# Patient Record
Sex: Female | Born: 1955
Health system: Southern US, Community
[De-identification: ages and names within clinical notes are randomized; demographics above are authoritative.]

## PROBLEM LIST (undated history)

## (undated) DIAGNOSIS — R42 Dizziness and giddiness: Secondary | ICD-10-CM

## (undated) DIAGNOSIS — J302 Other seasonal allergic rhinitis: Secondary | ICD-10-CM

## (undated) DIAGNOSIS — E119 Type 2 diabetes mellitus without complications: Secondary | ICD-10-CM

## (undated) DIAGNOSIS — B20 Human immunodeficiency virus [HIV] disease: Secondary | ICD-10-CM

## (undated) DIAGNOSIS — I639 Cerebral infarction, unspecified: Secondary | ICD-10-CM

## (undated) DIAGNOSIS — Z972 Presence of dental prosthetic device (complete) (partial): Secondary | ICD-10-CM

## (undated) DIAGNOSIS — R51 Headache: Secondary | ICD-10-CM

## (undated) DIAGNOSIS — I10 Essential (primary) hypertension: Secondary | ICD-10-CM

## (undated) DIAGNOSIS — Z973 Presence of spectacles and contact lenses: Secondary | ICD-10-CM

## (undated) DIAGNOSIS — J45909 Unspecified asthma, uncomplicated: Secondary | ICD-10-CM

## (undated) DIAGNOSIS — M1990 Unspecified osteoarthritis, unspecified site: Secondary | ICD-10-CM

## (undated) DIAGNOSIS — E785 Hyperlipidemia, unspecified: Secondary | ICD-10-CM

## (undated) DIAGNOSIS — Z21 Asymptomatic human immunodeficiency virus [HIV] infection status: Secondary | ICD-10-CM

## (undated) DIAGNOSIS — K5190 Ulcerative colitis, unspecified, without complications: Secondary | ICD-10-CM

## (undated) DIAGNOSIS — R519 Headache, unspecified: Secondary | ICD-10-CM

## (undated) DIAGNOSIS — N289 Disorder of kidney and ureter, unspecified: Secondary | ICD-10-CM

## (undated) HISTORY — PX: COLONOSCOPY W/ POLYPECTOMY: SHX1380

## (undated) HISTORY — PX: MULTIPLE TOOTH EXTRACTIONS: SHX2053

## (undated) HISTORY — PX: BUNIONECTOMY: SHX129

## (undated) HISTORY — DX: Cerebral infarction, unspecified: I63.9

## (undated) HISTORY — DX: Hyperlipidemia, unspecified: E78.5

## (undated) HISTORY — PX: TUBAL LIGATION: SHX77

## (undated) HISTORY — PX: TONSILLECTOMY: SUR1361

---

## 1998-04-30 ENCOUNTER — Emergency Department (HOSPITAL_COMMUNITY): Admission: EM | Admit: 1998-04-30 | Discharge: 1998-04-30 | Payer: Self-pay | Admitting: Emergency Medicine

## 2001-06-04 ENCOUNTER — Emergency Department (HOSPITAL_COMMUNITY): Admission: EM | Admit: 2001-06-04 | Discharge: 2001-06-04 | Payer: Self-pay | Admitting: Emergency Medicine

## 2002-12-07 ENCOUNTER — Emergency Department (HOSPITAL_COMMUNITY): Admission: EM | Admit: 2002-12-07 | Discharge: 2002-12-07 | Payer: Self-pay | Admitting: Emergency Medicine

## 2003-08-19 ENCOUNTER — Emergency Department (HOSPITAL_COMMUNITY): Admission: EM | Admit: 2003-08-19 | Discharge: 2003-08-19 | Payer: Self-pay | Admitting: Emergency Medicine

## 2005-01-06 ENCOUNTER — Ambulatory Visit (HOSPITAL_COMMUNITY): Admission: RE | Admit: 2005-01-06 | Discharge: 2005-01-06 | Payer: Self-pay | Admitting: Gynecology

## 2005-01-16 ENCOUNTER — Ambulatory Visit (HOSPITAL_COMMUNITY): Admission: RE | Admit: 2005-01-16 | Discharge: 2005-01-16 | Payer: Self-pay | Admitting: Gynecology

## 2005-05-04 ENCOUNTER — Inpatient Hospital Stay (HOSPITAL_COMMUNITY): Admission: AD | Admit: 2005-05-04 | Discharge: 2005-05-04 | Payer: Self-pay | Admitting: Gynecology

## 2005-05-06 ENCOUNTER — Ambulatory Visit (HOSPITAL_COMMUNITY): Admission: RE | Admit: 2005-05-06 | Discharge: 2005-05-06 | Payer: Self-pay | Admitting: Gynecology

## 2005-05-11 ENCOUNTER — Inpatient Hospital Stay (HOSPITAL_COMMUNITY): Admission: AD | Admit: 2005-05-11 | Discharge: 2005-05-16 | Payer: Self-pay | Admitting: Gynecology

## 2005-05-11 ENCOUNTER — Ambulatory Visit: Payer: Self-pay | Admitting: Pulmonary Disease

## 2005-05-13 ENCOUNTER — Encounter (INDEPENDENT_AMBULATORY_CARE_PROVIDER_SITE_OTHER): Payer: Self-pay | Admitting: Specialist

## 2006-09-16 ENCOUNTER — Emergency Department (HOSPITAL_COMMUNITY): Admission: EM | Admit: 2006-09-16 | Discharge: 2006-09-16 | Payer: Self-pay | Admitting: Emergency Medicine

## 2008-02-27 ENCOUNTER — Emergency Department (HOSPITAL_COMMUNITY): Admission: EM | Admit: 2008-02-27 | Discharge: 2008-02-27 | Payer: Self-pay | Admitting: Emergency Medicine

## 2008-02-28 ENCOUNTER — Ambulatory Visit: Payer: Self-pay | Admitting: *Deleted

## 2008-03-09 ENCOUNTER — Ambulatory Visit: Payer: Self-pay | Admitting: Nurse Practitioner

## 2008-03-09 DIAGNOSIS — J45909 Unspecified asthma, uncomplicated: Secondary | ICD-10-CM | POA: Insufficient documentation

## 2008-03-09 DIAGNOSIS — K029 Dental caries, unspecified: Secondary | ICD-10-CM | POA: Insufficient documentation

## 2008-03-22 ENCOUNTER — Encounter (INDEPENDENT_AMBULATORY_CARE_PROVIDER_SITE_OTHER): Payer: Self-pay | Admitting: Nurse Practitioner

## 2008-03-27 ENCOUNTER — Telehealth (INDEPENDENT_AMBULATORY_CARE_PROVIDER_SITE_OTHER): Payer: Self-pay | Admitting: Nurse Practitioner

## 2008-04-04 ENCOUNTER — Ambulatory Visit: Payer: Self-pay | Admitting: Nurse Practitioner

## 2008-04-04 DIAGNOSIS — F339 Major depressive disorder, recurrent, unspecified: Secondary | ICD-10-CM | POA: Insufficient documentation

## 2008-04-04 DIAGNOSIS — N951 Menopausal and female climacteric states: Secondary | ICD-10-CM | POA: Insufficient documentation

## 2008-04-04 LAB — CONVERTED CEMR LAB
ALT: 32 units/L (ref 0–35)
AST: 26 units/L (ref 0–37)
Albumin: 4.3 g/dL (ref 3.5–5.2)
Alkaline Phosphatase: 162 units/L — ABNORMAL HIGH (ref 39–117)
BUN: 13 mg/dL (ref 6–23)
Basophils Absolute: 0 10*3/uL (ref 0.0–0.1)
Basophils Relative: 0 % (ref 0–1)
Bilirubin Urine: NEGATIVE
CO2: 29 meq/L (ref 19–32)
Calcium: 9.5 mg/dL (ref 8.4–10.5)
Chlamydia, DNA Probe: NEGATIVE
Chloride: 101 meq/L (ref 96–112)
Cholesterol: 209 mg/dL — ABNORMAL HIGH (ref 0–200)
Creatinine, Ser: 0.81 mg/dL (ref 0.40–1.20)
Eosinophils Absolute: 0.1 10*3/uL (ref 0.0–0.7)
Eosinophils Relative: 2 % (ref 0–5)
GC Probe Amp, Genital: NEGATIVE
Glucose, Bld: 93 mg/dL (ref 70–99)
Glucose, Urine, Semiquant: NEGATIVE
HCT: 38.8 % (ref 36.0–46.0)
HDL: 56 mg/dL (ref >39)
Hemoglobin: 12.1 g/dL (ref 12.0–15.0)
KOH Prep: NEGATIVE
Ketones, urine, test strip: NEGATIVE
LDL Cholesterol: 117 mg/dL — ABNORMAL HIGH (ref 0–99)
Lymphocytes Relative: 40 % (ref 12–46)
Lymphs Abs: 2 10*3/uL (ref 0.7–4.0)
MCHC: 31.2 g/dL (ref 30.0–36.0)
MCV: 89.6 fL (ref 78.0–100.0)
Monocytes Absolute: 0.5 10*3/uL (ref 0.1–1.0)
Monocytes Relative: 9 % (ref 3–12)
Neutro Abs: 2.4 10*3/uL (ref 1.7–7.7)
Neutrophils Relative %: 49 % (ref 43–77)
Nitrite: NEGATIVE
Pap Smear: NEGATIVE
Platelets: 280 10*3/uL (ref 150–400)
Potassium: 4.2 meq/L (ref 3.5–5.3)
Protein, U semiquant: NEGATIVE
RBC: 4.33 M/uL (ref 3.87–5.11)
RDW: 14 % (ref 11.5–15.5)
Sodium: 141 meq/L (ref 135–145)
Specific Gravity, Urine: 1.025
TSH: 1.893 microintl units/mL (ref 0.350–4.50)
Total Bilirubin: 0.4 mg/dL (ref 0.3–1.2)
Total CHOL/HDL Ratio: 3.7
Total Protein: 7.3 g/dL (ref 6.0–8.3)
Triglycerides: 182 mg/dL — ABNORMAL HIGH (ref <150)
Urobilinogen, UA: 0.2
VLDL: 36 mg/dL (ref 0–40)
WBC Urine, dipstick: NEGATIVE
WBC: 5 10*3/uL (ref 4.0–10.5)
pH: 5.5

## 2008-04-05 ENCOUNTER — Encounter (INDEPENDENT_AMBULATORY_CARE_PROVIDER_SITE_OTHER): Payer: Self-pay | Admitting: Nurse Practitioner

## 2008-04-06 ENCOUNTER — Encounter (INDEPENDENT_AMBULATORY_CARE_PROVIDER_SITE_OTHER): Payer: Self-pay | Admitting: Nurse Practitioner

## 2008-04-10 DIAGNOSIS — N3000 Acute cystitis without hematuria: Secondary | ICD-10-CM | POA: Insufficient documentation

## 2008-06-19 ENCOUNTER — Telehealth (INDEPENDENT_AMBULATORY_CARE_PROVIDER_SITE_OTHER): Payer: Self-pay | Admitting: Nurse Practitioner

## 2008-06-19 ENCOUNTER — Inpatient Hospital Stay (HOSPITAL_COMMUNITY): Admission: EM | Admit: 2008-06-19 | Discharge: 2008-06-21 | Payer: Self-pay | Admitting: Family Medicine

## 2008-06-26 ENCOUNTER — Ambulatory Visit: Payer: Self-pay | Admitting: Nurse Practitioner

## 2008-06-26 DIAGNOSIS — G4700 Insomnia, unspecified: Secondary | ICD-10-CM | POA: Insufficient documentation

## 2008-06-26 DIAGNOSIS — K8010 Calculus of gallbladder with chronic cholecystitis without obstruction: Secondary | ICD-10-CM | POA: Insufficient documentation

## 2008-06-26 DIAGNOSIS — K5190 Ulcerative colitis, unspecified, without complications: Secondary | ICD-10-CM | POA: Insufficient documentation

## 2008-06-27 ENCOUNTER — Telehealth (INDEPENDENT_AMBULATORY_CARE_PROVIDER_SITE_OTHER): Payer: Self-pay | Admitting: Nurse Practitioner

## 2008-07-04 ENCOUNTER — Encounter (INDEPENDENT_AMBULATORY_CARE_PROVIDER_SITE_OTHER): Payer: Self-pay | Admitting: *Deleted

## 2008-09-04 ENCOUNTER — Telehealth: Payer: Self-pay | Admitting: Internal Medicine

## 2009-03-11 ENCOUNTER — Ambulatory Visit: Payer: Self-pay | Admitting: Cardiology

## 2009-03-12 ENCOUNTER — Observation Stay (HOSPITAL_COMMUNITY): Admission: EM | Admit: 2009-03-12 | Discharge: 2009-03-12 | Payer: Self-pay | Admitting: Emergency Medicine

## 2009-03-14 ENCOUNTER — Telehealth (INDEPENDENT_AMBULATORY_CARE_PROVIDER_SITE_OTHER): Payer: Self-pay | Admitting: *Deleted

## 2009-03-15 ENCOUNTER — Telehealth (INDEPENDENT_AMBULATORY_CARE_PROVIDER_SITE_OTHER): Payer: Self-pay | Admitting: *Deleted

## 2009-03-16 ENCOUNTER — Emergency Department (HOSPITAL_COMMUNITY): Admission: EM | Admit: 2009-03-16 | Discharge: 2009-03-17 | Payer: Self-pay | Admitting: Emergency Medicine

## 2009-03-20 ENCOUNTER — Telehealth (INDEPENDENT_AMBULATORY_CARE_PROVIDER_SITE_OTHER): Payer: Self-pay

## 2009-05-20 ENCOUNTER — Emergency Department (HOSPITAL_COMMUNITY): Admission: EM | Admit: 2009-05-20 | Discharge: 2009-05-21 | Payer: Self-pay | Admitting: Emergency Medicine

## 2009-08-01 ENCOUNTER — Emergency Department (HOSPITAL_COMMUNITY): Admission: EM | Admit: 2009-08-01 | Discharge: 2009-08-01 | Payer: Self-pay | Admitting: Family Medicine

## 2010-08-03 ENCOUNTER — Encounter: Payer: Self-pay | Admitting: Gynecology

## 2010-08-04 ENCOUNTER — Encounter: Payer: Self-pay | Admitting: Family Medicine

## 2010-10-18 LAB — POCT CARDIAC MARKERS
CKMB, poc: <1 ng/mL — ABNORMAL LOW (ref 1.0–8.0)
CKMB, poc: <1 ng/mL — ABNORMAL LOW (ref 1.0–8.0)
Myoglobin, poc: 46.8 ng/mL (ref 12–200)
Myoglobin, poc: 48.2 ng/mL (ref 12–200)
Troponin i, poc: <0.05 ng/mL (ref 0.00–0.09)
Troponin i, poc: <0.05 ng/mL (ref 0.00–0.09)

## 2010-10-18 LAB — DIFFERENTIAL
Basophils Absolute: 0 10*3/uL (ref 0.0–0.1)
Basophils Relative: 0 % (ref 0–1)
Eosinophils Absolute: 0.1 10*3/uL (ref 0.0–0.7)
Eosinophils Relative: 2 % (ref 0–5)
Lymphocytes Relative: 43 % (ref 12–46)
Lymphs Abs: 2.7 10*3/uL (ref 0.7–4.0)
Monocytes Absolute: 0.6 10*3/uL (ref 0.1–1.0)
Monocytes Relative: 9 % (ref 3–12)
Neutro Abs: 2.8 10*3/uL (ref 1.7–7.7)
Neutrophils Relative %: 45 % (ref 43–77)

## 2010-10-18 LAB — CK TOTAL AND CKMB (NOT AT ARMC)
CK, MB: 1 ng/mL (ref 0.3–4.0)
Relative Index: 0.8 (ref 0.0–2.5)
Total CK: 119 U/L (ref 7–177)

## 2010-10-18 LAB — POCT I-STAT, CHEM 8
BUN: 15 mg/dL (ref 6–23)
Calcium, Ion: 1.14 mmol/L (ref 1.12–1.32)
Chloride: 106 mEq/L (ref 96–112)
Creatinine, Ser: 0.8 mg/dL (ref 0.4–1.2)
Glucose, Bld: 98 mg/dL (ref 70–99)
HCT: 36 % (ref 36.0–46.0)
Hemoglobin: 12.2 g/dL (ref 12.0–15.0)
Potassium: 3.5 mEq/L (ref 3.5–5.1)
Sodium: 141 mEq/L (ref 135–145)
TCO2: 26 mmol/L (ref 0–100)

## 2010-10-18 LAB — CBC
HCT: 34.6 % — ABNORMAL LOW (ref 36.0–46.0)
Hemoglobin: 11.6 g/dL — ABNORMAL LOW (ref 12.0–15.0)
MCHC: 33.7 g/dL (ref 30.0–36.0)
MCV: 86.2 fL (ref 78.0–100.0)
Platelets: 219 10*3/uL (ref 150–400)
RBC: 4.01 MIL/uL (ref 3.87–5.11)
RDW: 13.4 % (ref 11.5–15.5)
WBC: 6.3 10*3/uL (ref 4.0–10.5)

## 2010-10-18 LAB — TROPONIN I: Troponin I: 0.02 ng/mL (ref 0.00–0.06)

## 2010-10-19 LAB — CARDIAC PANEL(CRET KIN+CKTOT+MB+TROPI)
CK, MB: 1 ng/mL (ref 0.3–4.0)
Relative Index: 0.8 (ref 0.0–2.5)
Total CK: 123 U/L (ref 7–177)
Troponin I: 0.02 ng/mL (ref 0.00–0.06)

## 2010-10-19 LAB — CBC
HCT: 33.7 % — ABNORMAL LOW (ref 36.0–46.0)
HCT: 35.3 % — ABNORMAL LOW (ref 36.0–46.0)
Hemoglobin: 11.3 g/dL — ABNORMAL LOW (ref 12.0–15.0)
Hemoglobin: 11.8 g/dL — ABNORMAL LOW (ref 12.0–15.0)
MCHC: 33.3 g/dL (ref 30.0–36.0)
MCHC: 33.6 g/dL (ref 30.0–36.0)
MCV: 86.6 fL (ref 78.0–100.0)
Platelets: 204 10*3/uL (ref 150–400)
Platelets: 231 10*3/uL (ref 150–400)
RBC: 4.08 MIL/uL (ref 3.87–5.11)
RDW: 13.5 % (ref 11.5–15.5)
RDW: 13.8 % (ref 11.5–15.5)
WBC: 6.9 10*3/uL (ref 4.0–10.5)

## 2010-10-19 LAB — BASIC METABOLIC PANEL
BUN: 10 mg/dL (ref 6–23)
CO2: 30 mEq/L (ref 19–32)
Calcium: 9.4 mg/dL (ref 8.4–10.5)
Chloride: 104 mEq/L (ref 96–112)
Creatinine, Ser: 0.91 mg/dL (ref 0.4–1.2)
GFR calc Af Amer: >60 mL/min (ref >60)
GFR calc non Af Amer: >60 mL/min (ref >60)
Glucose, Bld: 135 mg/dL — ABNORMAL HIGH (ref 70–99)
Potassium: 3.2 mEq/L — ABNORMAL LOW (ref 3.5–5.1)
Sodium: 141 mEq/L (ref 135–145)

## 2010-10-19 LAB — HEMOGLOBIN A1C
Hgb A1c MFr Bld: 6.5 % — ABNORMAL HIGH (ref 4.6–6.1)
Mean Plasma Glucose: 140 mg/dL

## 2010-10-19 LAB — COMPREHENSIVE METABOLIC PANEL
Albumin: 3.5 g/dL (ref 3.5–5.2)
BUN: 11 mg/dL (ref 6–23)
Calcium: 8.8 mg/dL (ref 8.4–10.5)
Creatinine, Ser: 0.88 mg/dL (ref 0.4–1.2)
Glucose, Bld: 110 mg/dL — ABNORMAL HIGH (ref 70–99)
Total Protein: 6.3 g/dL (ref 6.0–8.3)

## 2010-10-19 LAB — POCT CARDIAC MARKERS
Myoglobin, poc: 75.9 ng/mL (ref 12–200)
Troponin i, poc: <0.05 ng/mL (ref 0.00–0.09)

## 2010-10-19 LAB — LIPID PANEL
Cholesterol: 196 mg/dL (ref 0–200)
LDL Cholesterol: 111 mg/dL — ABNORMAL HIGH (ref 0–99)
Triglycerides: 207 mg/dL — ABNORMAL HIGH (ref <150)
VLDL: 41 mg/dL — ABNORMAL HIGH (ref 0–40)

## 2010-10-19 LAB — DIFFERENTIAL
Basophils Absolute: 0 10*3/uL (ref 0.0–0.1)
Basophils Relative: 0 % (ref 0–1)
Eosinophils Absolute: 0.1 10*3/uL (ref 0.0–0.7)
Eosinophils Relative: 1 % (ref 0–5)
Lymphocytes Relative: 46 % (ref 12–46)
Lymphs Abs: 3.2 10*3/uL (ref 0.7–4.0)
Monocytes Absolute: 0.5 10*3/uL (ref 0.1–1.0)
Monocytes Relative: 7 % (ref 3–12)
Neutro Abs: 3.1 10*3/uL (ref 1.7–7.7)
Neutrophils Relative %: 45 % (ref 43–77)

## 2010-10-19 LAB — CK TOTAL AND CKMB (NOT AT ARMC): Total CK: 155 U/L (ref 7–177)

## 2010-11-26 NOTE — H&P (Signed)
[name], [name]           ACCOUNT NO.:  [id number]   MEDICAL RECORD NO.:  [id number]          PATIENT TYPE:  INP   LOCATION:  6713                         FACILITY:  [facility]   PHYSICIAN:  Darryl D. Prime, MD    DATE OF BIRTH:  12-Nov-1955   DATE OF ADMISSION:  06/19/2008  DATE OF DISCHARGE:                              HISTORY & PHYSICAL   The patient is a full code.   Patient has no primary care physician.   CHIEF COMPLAINT:  Bloody diarrhea for weeks.   HISTORY OF PRESENT ILLNESS:  [name] is a 55 year old female with  a history of ulcerative colitis, but has had no flare since she was 55  years old, who notes that for the last 3 weeks bloody diarrhea every 28  minutes or so.  The patient notes associated left lower quadrant crampy,  constant pain as well, mild-to-moderate.  The patient notes nausea and  vomiting started a week ago, nonbloody, nonbilious occasionally.  She  has taken nothing for it.  She denies any fever.  She notes profound  weakness with it, however.  Symptoms persisted, so she decided to come  to the emergency room.  In the emergency room, she was given Zofran and  Dilaudid at urgent care.  Nothing was given in the [facility] ED.  The  patient was hemoccult positive in the ED.   PAST MEDICAL HISTORY/PAST SURGICAL HISTORY:  1. She has a history of ulcerative colitis, as above.  2. History of asthma.  3. Bronchitis.  4. She has a history of C-section 3 years ago.  Has a 69-year-old      child.  5. History of a keratoma, not otherwise specified, in her eye on the      right.   ALLERGIES:  She is allergic to ERYTHROMYCIN, PENICILLIN, and  TETRACYCLINE, all of which cause nausea and vomiting.   MEDICATIONS:  None.  She takes no over-the-counter medications.   FAMILY HISTORY:  Grandfather died of colon cancer.  There is no  ulcerative colitis in the family.   REVIEW OF SYSTEMS:  The 14-point review of systems is negative, other  than as stated  above.   PHYSICAL EXAMINATION:  VITAL SIGNS:  Blood pressure is 124/74.  She is  afebrile with a temperature of 98.3, pulse 88, respiratory rate 18, sats  100% on room air.  GENERAL:  She is a female who looks her stated age, lying in the bed in  no acute distress.  HEENT:  Normocephalic and atraumatic.  Pupils are equal, round and  reactive to light.  Extraocular movements are intact.  Oropharynx is  dry.  NECK:  Supple with no lymphadenopathy or  thyromegaly.  No carotid  bruits.  No jugular venous distention.  LUNGS:  Clear to auscultation bilaterally.  CARDIOVASCULAR:  Regular rate and rhythm with no murmurs, rubs or  gallops.  Normal S1 and S2.  No S3 or S4.  ABDOMEN:  Soft.  There is tenderness in the left lower quadrant to  gentle palpation.  There is no sign of rebound tenderness.  No signs of  an acute abdomen.  Normoactive bowel sounds.  EXTREMITIES:  No clubbing, cyanosis or edema.  NEUROLOGIC:  She is alert and oriented x4 with cranial nerves grossly  intact.  Strength and sensation grossly intact.   LABORATORY DATA:  Sodium 140, potassium 3.2, chloride 104, bicarb 27,  BUN 10, creatinine 1,  glucose 91, alk phos 140, otherwise normal LFTs.  White count 6.4 with a hemoglobin of 11.9, hematocrit 36.3, platelets  273, segs 61, lymphocytes 25, monocytes 13.  Patient's urinalysis showed  RBCs 7-10, WBCs 0-2, ketones 40, blood moderate.  Hemoglobin in 2006 in  September was 10.   CT of the abdomen and pelvis shows mucosa with edema of the rectosigmoid  and the descending colon, consistent with colitis.   Her urine pregnancy test is pending.   ASSESSMENT/PLAN:  This is a patient with a history of ulcerative  colitis, now with a possible ulcerative colitis flare later on in life.  She has rectosigmoid involvement.  The flare is mild-to-moderate.  She  is hypokalemic.  She does not appear toxic.  At this time, she will be  admitted with a GI consult.  Patient will be given 5  ASA suppositories  as well as p.o. 5-ASA.  We will follow her white count closely and her  fever curve closely (she has been afebrile).  We will hold antibiotics  for now.  For her hypokalemia, we will replete.  We will check a urine  pregnancy test.  Further course of her management, will be dictated by  consult from GI medicine.      Darryl D. Prime, MD  Electronically Signed     DDP/MEDQ  D:  06/20/2008  T:  06/20/2008  Job:  [id number]

## 2010-11-26 NOTE — Consult Note (Signed)
[name], [name]           ACCOUNT NO.:  [id number]   MEDICAL RECORD NO.:  [id number]          Wade TYPE:  INP   LOCATION:  4705                         FACILITY:  [facility]   PHYSICIAN:  Marcello Moores C. Wall, MD, FACCDATE OF BIRTH:  [birth year]   DATE OF CONSULTATION:  03/12/2009  DATE OF DISCHARGE:  03/12/2009                                 CONSULTATION   [name] does not have a primary care physician nor does [name]  have a primary cardiologist.   [place]:  Chest pain.   HISTORY OF PRESENT ILLNESS:  [name] is a very pleasant 55-year-  old African American female with no known history of CAD, but risk  factors including dyslipidemia, obesity/sedentary lifestyle, and Shannon  question of a positive family history with mother having an MI sometime  in her late 74s, presenting with an episode of chest heaviness and left  upper extremity pain in Shannon setting of increased stress secondary to an  argument with her husband.  [name] was in her usual state of health  until yesterday when she experienced some chest heaviness while she was  at church.  She discounted these symptoms as they seemed similar to her  prior asthma related symptoms.  Unfortunately, later she had an argument  with her husband and noted that her chest heaviness returned, but more  pronounced.  Also, chest heaviness was slightly different and at this  time associated with left upper extremity pain starting at Shannon shoulder  and radiating all Shannon way down to Shannon rest.  [name] denies any  other associated symptoms.  At worst, left upper extremity pain 6/10 in  severity.  Chest heaviness worse with deep inspiration, but no known  aggravating factors to left upper extremity pain.  [name] became  concerned with her change in symptoms and presented to [facility] ED for eval.  Upon arrival, [name]'s heart rate and BP  were within normal limits, chest x-ray showed no  acute disease, EKG  without acute changes, and point-of-care markers x1 with later full set  of cardiac enzymes x1 both negative.  In Shannon emergency department, Shannon  Wade was given baby aspirin and sublingual nitroglycerin and her  symptoms improved.  Since that time, she has had only a minimal  sensation in her left upper extremity and chest heaviness also improved,  still experiencing worsening with deep inspiration.  Also since then,  [name] has had systolic BP in Shannon 387F, which [name] reports  as being highly atypical.   PAST MEDICAL HISTORY:  1. Dyslipidemia.  2. Preeclampsia  3. Asthma.  4. History of bronchitis.  5. Ulcerative colitis.  6. Obesity/sedentary lifestyle.   PAST SURGICAL HISTORY:  1. C-section.  2. Tubal ligation.   SOCIAL HISTORY:  [name] lives in [place] with her husband and  her child.  She takes care of 3 small children daily.  She has no  tobacco abuse, EtOH, or illicit drug use history.  She has a regular  diet and does not take herbal medications.  She does no regular  exercise, but is active at home.   FAMILY HISTORY:  Mother living at age 54 with positive history of CAD,  s/p MI in her late 75s.  Father is deceased from lung cancer with  history of smoking.  No siblings with history of CAD.   REVIEW OF SYSTEMS:  [name] has chronic orthopnea that improves with  p.r.n. inhaler use.  She also has edema in her lower extremities if she  is up on her feet for too long.  She does report a history of chest  heaviness, but slightly just somewhat to Shannon current situation with  flare-ups of her asthma.  Otherwise, [name] has been under a lot of  stress recently and has noted worsening anxiety and depressive symptoms.  Otherwise, see HPI.  All other systems reviewed and were negative.   CODE STATUS:  Full.   ALLERGIES AND INTOLERANCES:  1. PENICILLIN.  2. TETRACYCLINE.  3. ERYTHROMYCIN.  4. LACTOSE.  5. ASPIRIN (stomach  upset).   MEDICATIONS:  Home meds include only p.r.n. inhalers.  In Shannon emergency  apartment, [name] is given sublingual nitro and baby aspirin.  Here, she has been getting Lovenox for DVT prophylaxis, Crestor 20 mg  p.o. daily, Protonix 40 mg p.o. daily, and p.r.n. meds.   PHYSICAL EXAMINATION:  VITAL SIGNS:  BP initially 108/73to, now at  142/74.  Pulse 62 on arrival to [facility] ED and currently respiration rate  20, O2 saturation 100% on room air.  [name] has been afebrile,  temperature 97.9 degrees Fahrenheit recorded today.  Weight is 69.9 kg.  GENERAL:  [name] is alert and oriented x3, in no apparent distress.  She is able to speak and move easily without respiratory distress.  She  is very tearful during exam when discussing her history.  HEENT:  Head is normocephalic and atraumatic.  Pupils are equal, round,  and reactive to light.  Extraocular muscles are intact.  Nares are  patent without discharge.  Dentition is fair.  Oropharynx without  erythema or exudates.  NECK:  Supple without lymphadenopathy and no thyromegaly, question of  bruit versus radiation of heart sounds to right neck, JVD 6-8 cm on  right side, none on Shannon left.  HEART:  Heart rate is regular with audible S1 and S2.  No clicks, rubs,  murmurs, or gallops.  Pulses are 2+ and equal in both upper and lower  extremities bilaterally.  LUNGS:  Clear to auscultation bilaterally.  SKIN:  No rashes, lesions, or petechiae.  ABDOMEN:  Soft, nontender, and nondistended.  Normal abdominal bowel  sounds.  No rebound or guarding.  No hepatosplenomegaly.  No pulsations.  [name] is mildly obese.  EXTREMITIES:  No clubbing, cyanosis, or edema.  MUSCULOSKELETAL:  Without joint deformity or effusions.  No spinal or  CVA tenderness.  NEURO:  Cranial nerves II-XII are grossly intact.  Strength 5/5 in all  extremities and axial groups.  Normal sensation throughout and normal  cerebellar function.   RADIOLOGY:   Chest x-ray shows no active disease.  EKG, sinus bradycardia  with primary AV block, rate 61, T-wave inversion in III and flattening  in V3, otherwise no significant ST-T wave changes.  No significant Q-  waves.  Normal axis.  No evidence of hypertrophy.  PR 218, otherwise  intervals within normal limits.  No prior tracing for comparison.   LABORATORY DATA:  WBC 6.2 with normal differential, HGB 11.3, HCT 33.7,  and PLT count 204.  Sodium 142, potassium 3.5, chloride 107, CO2 of 29,  BUN 11, creatinine 0.88, and glucose 110.  Liver function tests within  normal limits except for slightly elevated alk phos of 124, albumin 3.5,  and calcium 8.8.  Point-of-care markers negative x1.  Cardiac enzymes,  CK 155, MB 1.2, and troponin I of 0.01 (negative).  Total cholesterol  196, triglycerides 207, HDL 44, and LDL 111.   ASSESSMENT AND PLAN:  [name] is a 55 year old African American  female with Shannon above-noted medical history, presenting with atypical  chest pain in Shannon setting of increased stress.  At this point, Shannon  Wade is low risk and without any objective evidence of cardiac  etiology to her symptoms.  We would proceed with an outpatient stress  Myoview.  It has been scheduled for this Thursday at 9:15 a.m.  Thank  you for Shannon consult.      Guss Bunde, PAC      Thomas C. Verl Blalock, MD, [facility]  Electronically Signed    MS/MEDQ  D:  03/12/2009  T:  03/13/2009  Job:  [id number]

## 2010-11-26 NOTE — H&P (Signed)
[name]           ACCOUNT NO.:  [id number]   MEDICAL RECORD NO.:  [id number]          PATIENT TYPE:  INP   LOCATION:  4705                         FACILITY:  [facility]   PHYSICIAN:  Arlyss Repress, MD        DATE OF BIRTH:  1955-10-09   DATE OF ADMISSION:  03/11/2009  DATE OF DISCHARGE:                              HISTORY & PHYSICAL   PRIMARY CARE PHYSICIAN:  The patient has no primary care physician.   CHIEF COMPLAINT:  Chest pain.   HISTORY OF PRESENT ILLNESS:  A 55 year old female with complaint of  chest pain about 8:00 p.m.  It was substernal, dull and lasting about 10  minutes and occurring while she was standing.  The patient notes that  had some left upper extremity discomfort in her arm at the time of the  chest pain.  The patient may have had some slight shortness of breath  but denies any palpitations, nausea, vomiting, diaphoresis.  The patient  cannot recall anything that made it better or worse.  The patient denies  any fever, chills, cough, heartburn.  The patient was given three  sublingual nitroglycerin in the ED with slight relief.  EKG showed  normal sinus rhythm at 90, normal axis, normal intervals, V2 and V3  flipped, T-wave inversion in the I, AVL.  Chest x-ray was negative for  any acute process.  The patient will be admitted for workup of chest  pain.   PAST MEDICAL HISTORY:  1. Asthma.  2. Bronchitis.  3. Ulcers, colitis.  4. Preeclampsia  5. Hyperlipidemia.  6. History of keratoma in her right eye.   PAST SURGICAL HISTORY:  1. C section.  2. Tubal ligation.   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  She is  married.  She does not smoke or drink.   FAMILY HISTORY:  Mother is alive at age 11 and has hypertension,  diabetes and heart attack in her late 35s.  Her father died at age 85 of  lung cancer, and he was a smoker.  Her grandfather died of colon cancer  per prior records, but there is no ulcerative colitis  in her family.   ALLERGIES:  1. TETRACYCLINE.  2. PENICILLIN.  3. ERYTHROMYCIN BASE.   MEDICATIONS:  None.   PHYSICAL EXAMINATION:  VITAL SIGNS:  Temperature 97.6, pulse 62, blood  pressure 124/81, respiratory rate 20, pulse oximetry 97% on room air.  HEENT:  Anicteric, extraocular movements intact.  No nystagmus.  Pupils  1.5 mm, symmetric, direct, near  reflexes intact.  Mucous membranes  moist.  NECK:  No JVD, no bruit, no thyromegaly, no adenopathy.  HEART:  Regular rate and rhythm.  S1-S2.  No murmurs, gallops or rubs.  CHEST:  No chest wall tenderness to palpation.  LUNGS:  Clear to auscultation bilaterally.  ABDOMEN:  Soft, obese, nontender, nondistended.  Positive bowel sounds.  EXTREMITIES:  No cyanosis, clubbing or edema.  SKIN:  No rashes.  LYMPH NODES:  No adenopathy.  NEUROLOGIC:  Cranial nerves II-XII intact, reflexes 2+, symmetric,  diffuse with downgoing toes  bilaterally, motor strength 5/5 in all four  extremities, pinprick intact.   LABORATORY DATA:  Sodium 141, potassium 3.2, chloride 104, bicarbonate  30, BUN 10, creatinine 0.91.  WBC 6.9, hemoglobin 11.8, platelet count  231.  Troponin I less than 0.05.   ELECTROCARDIOGRAM:  See reading above.   CHEST X-RAY:  Negative for any acute process.   ASSESSMENT/PLAN:  1. Chest pain.  The patient will be placed on telemetry.  Will check      troponin-I q.8h. x3 sets.  The patient will be placed on Crestor      and carvedilol.  Will check a fasting lipid panel.  Will check a      hemoglobin A1c.  The patient declines aspirin due to history of      prior upset stomach with this medication.  Will obtain a nuclear      stress test in the morning and if necessary a cardiology consult in      order to obtain a nuclear stress test.  2. Hypokalemia.  KCl 30 mEq p.o. x1.  Repeat potassium in the a.m.  3. Asthma, stable.  4. Ulcerative colitis, stable.  5. DVT prophylaxis with Lovenox 40 mg subcutaneous daily.       Arlyss Repress, MD  Electronically Signed     JYK/MEDQ  D:  03/12/2009  T:  03/12/2009  Job:  [id number]

## 2010-11-26 NOTE — Discharge Summary (Signed)
NAMEESLI, CLEMENTS           ACCOUNT NO.:  [id number]   MEDICAL RECORD NO.:  [id number]          PATIENT TYPE:  INP   LOCATION:  0037                         FACILITY:  [facility]   PHYSICIAN:  Reyne Dumas, MD       DATE OF BIRTH:  April 21, 1956   DATE OF ADMISSION:  06/19/2008  DATE OF DISCHARGE:  06/21/2008                               DISCHARGE SUMMARY   DISCHARGE DIAGNOSES:  1. Ulcerative colitis exacerbation.  2. Hypokalemia.  3. Dehydration.   SUBJECTIVE:  This is a 55 year old female with a history of ulcerative  colitis which has been stable without any active medical therapy for the  same for the last several years who presents to the ER with 3-week  history of bloody diarrhea worse over the last couple of days associated  with cramping abdominal pain and also associated of some nausea and  vomiting.  In the ER, the patient was found to be fairly symptomatic and  clinically dehydrated.  A CT scan of the abdomen and pelvis showed  prominent mucosa of the splenic flexure and the left colon worrisome for  colitis.  Multiple gallstones were also seen which were not calcified.  No ductal dilatation was seen. The patient was found to have a normal  white blood cell count.  Stool studies were sent with no growth so far.  She was initiated on antibiotic treatment with ciprofloxacin and Flagyl,  also started on p.o. mesalamine per rectum, and also started on IV Solu-  Medrol 20 IV q.8h.  The patient was also hydrated and her potassium was  repleted.  She responded well to the treatment and the diarrhea  resolved.  Diet was advanced from clear liquids to a full liquid diet  which she tolerated well and currently is well enough and is requesting  to go home.   DISCHARGE MEDICATIONS:  Ciprofloxacin 500 mg p.o. q.12h. for 5 days,  Flagyl 500 p.o. q.8h. for 5 days, K-Dur 40 mEq p.o. daily for 5 days,  mesalamine 800 mg p.o. three times a day, mesalamine per rectum 1000 mg  q.h.s.,  prednisone 5 mg tablets 8 tablets for 5 days, 6 tablets for 5  days, 4 tablets for 5days, 3 tablets for 5 days, 2 tablets for 5 days, 1  tablet for 5   DIET:  Mechanical soft diet with plenty of fluids.   Follow up concerns:  The patient to have follow-up with primary care  Shawon Denzer in 5-7 days.  Primary care, the patient does not have one.  Therefore case management would need to establish this prior to her  discharge today.      Reyne Dumas, MD  Electronically Signed     NA/MEDQ  D:  06/21/2008  T:  06/21/2008  Job:  [id number]

## 2010-11-26 NOTE — Discharge Summary (Signed)
[name], [name]           ACCOUNT NO.:  [id number]   MEDICAL RECORD NO.:  [id number]          PATIENT TYPE:  INP   LOCATION:  4705                         FACILITY:  [facility]   PHYSICIAN:  Kieth Brightly, MDDATE OF BIRTH:  1955-10-26   DATE OF ADMISSION:  03/11/2009  DATE OF DISCHARGE:                               DISCHARGE SUMMARY   PRIMARY CARE PHYSICIAN:  Patient is expected to see Dr. Jana Hakim, phone number and contact information given.   CARDIOLOGIST:  Dr. Jenell Milliner, [facility].   REASON FOR ADMISSION:  Pain in the chest and pain in the left upper  extremity.   DISCHARGE DIAGNOSES:  1. Chest pain - acute myocardial infarction ruled out.  For outpatient      stress test.  2. Cervical spondylosis with degenerative joint disease.  3. Cervical spine radiculopathy with pain in the left upper extremity.  4. Dyslipidemia.   DISCHARGE MEDICATIONS:  1. Neurontin 100 mg p.o. t.i.d.  2. Zocor 20 mg p.o. q.h.s.  3. Tylenol Extra Strength 400 mg tablets 2 tablets p.o. 4 times daily      as needed for pain in left arm and for headache.   HOSPITAL COURSE:  1. Chest pain.  Patient was admitted early today for chest pain      symptoms.  Patient has pain on right and left side of the chest and      she described it as heaviness.  On clinical examination, there was      reproducibility of the chest pain and pressure on the chest.  Also,      the pain in the left upper extremity was described as a shooting      pain extending all the way from the neck and all the way to the      left end of the upper extremity which was more classical of      cervical spine radiculopathy.  Patient is obese and she is 55 years      old and she has not seen a physician for quite some time.  She has      prior history of asthma which is currently stable and she is not on      any medications for that.  Lab results revealed that she has dyslipidemia with an LDL of 111 and  triglycerides of 207.  In view of all these findings and considering the  risk for the patient, Cardiology consult was called to evaluate for  possible outpatient stress test.  Patient was seen by [facility]  Cardiology, Dr. Jenell Milliner, and he has opined that the risk for patient  to have ischemic heart disease at present is low and so she has been  scheduled for outpatient stress test on March 16, 2009, at 9:15 a.m.  Patient has exhibited understanding about getting the stress test done.  1. Cervical spine radiculopathy, has been started on Neurontin and has      been advised to take Tylenol tablets p.r.n. for pain.  2. Dyslipidemia.  Patient has been started on Zocor 20 mg p.o. q.h.s.   DISPOSITION:  Discharged  back home.   FOLLOWUP:  1. Make appointment with Dr. Jana Hakim for further evaluation      of cervical spine radiculopathy for MRI, as well as possible      Neurology/Neurosurgery referral.  2. Stress test on March 16, 2009, at 9:15 a.m. at [facility]      Cardiology, phone number [phone].   DISCHARGE INSTRUCTIONS:  1. Take a low-fat diet.  2. Adhere with appointments and adhere with medications.   A total of 45 minutes spent on today's discharge.      Kieth Brightly, MD  Electronically Signed     UT/MEDQ  D:  03/12/2009  T:  03/12/2009  Job:  [id number]   cc:   Jana Hakim, M.D.  Thomas C. Wall, MD, [facility]

## 2010-11-29 NOTE — Discharge Summary (Signed)
[name], [name]           ACCOUNT NO.:  [id number]   MEDICAL RECORD NO.:  [id number]          PATIENT TYPE:  INP   LOCATION:  0109                          FACILITY:  [facility]   PHYSICIAN:  Willey Blade, MD  DATE OF BIRTH:  [birth year]   DATE OF ADMISSION:  05/11/2005  DATE OF DISCHARGE:                                 DISCHARGE SUMMARY   REASON FOR HOSPITALIZATION:  Thirty-five and one-seventh weeks gestation  with severe preeclampsia.   IN-HOSPITAL PROCEDURES:  1.  Obstetrical ultrasonography. BPP/NST  2.  Repeat cesarean section and Pomeroy bilateral tubal ligation.   FINAL DIAGNOSES:  1.  Thirty-five and three-sevenths weeks gestation with severe preeclampsia.  2.  Exacerbation of acute asthma.  3.  Preterm viable delivery of female infant.  4.  Request for sterilization.   HOSPITAL COURSE:  This patient is a 55 year old gravida 4 para 2-0-2-2  African-American female; [place] December2,2006; admitted at 68 and one-seventh  weeks gestation because of blood pressure in the range of 323-557 systolic  and 32-20 diastolic with 3+ proteinuria. The patient underwent observation  in the antenatal unit including PIH labs which remained normal. Her blood  pressure still remained elevated and urinalysis demonstrated 3+ proteinuria.  The patient did demonstrate intrauterine growth retardation on a prior  ultrasound obtained on October 24,2006, with estimated fetal weight in the  25th to 50th percentile. An ultrasound obtained in the morning of said  cesarean section demonstrated adequate fluid with a BPP of 6/8. The  patient's nonstress test was normal. The patient also had an acute  exacerbation of asthma for which she was treated with at the onset with  azithromycin.   The patient underwent a repeat low transverse cesarean section and Pomeroy  bilateral tubal ligation at 35 and three-sevenths weeks gestation on  October31,2006. A preterm viable female was delivered, Apgars and  weight per  delivery room record.   The patient's postpartum course was complicated by an acute exacerbation of  asthma which presented at the time of her admission. Her blood pressures  remained in the 254-270 range systolic and 62-376 diastolic. She responded  to labetalol 100 mg twice a day. The patient had a pulmonary consultation at  which time she was placed on appropriate respiratory therapy, Avelox, in  addition to Advair 500/50, and oral prednisone. Her abdomen was soft.  Incision dry. Lungs demonstrated bilateral rales. Chest x-ray revealed  evidence postoperatively of slight bilateral pulmonary effusions but without  infiltrates. Calves were without tenderness. The patient had minimal  bleeding.   The patient was discharged with Vicodin one to two every 4-6 hours as needed  for pain, a Sterapred 6-day pack, labetalol 100 mg twice a day, Avelox 400  mg daily for 7 days, a Ventolin inhaler as needed one to two puffs every 6  hours, and Advair 500/50 one puff twice a day. The patient will be seen in  the office in 5-7 days to have her staples  removed. Instructions including contacting the office for temperature  elevation above 100.4 degrees Fahrenheit, increasing abdominal or incisional  pain, incisional drainage or erythema, increasing  vaginal bleeding,  constipation, or worsening of her acute asthma.      Willey Blade, MD  Electronically Signed     SHB/MEDQ  D:  05/16/2005  T:  05/16/2005  Job:  [id number]

## 2010-11-29 NOTE — Consult Note (Signed)
NAMEBERENICE, Wade           ACCOUNT NO.:  [id number]   MEDICAL RECORD NO.:  [id number]          PATIENT TYPE:  INP   LOCATION:  9373                          FACILITY:  [facility]   PHYSICIAN:  Chesley Mires, M.D.      DATE OF BIRTH:  03-19-1956   DATE OF CONSULTATION:  05/15/2005  DATE OF DISCHARGE:                                   CONSULTATION   REFERRING PHYSICIAN:  Dr. Rivka Safer   REASON FOR CONSULTATION:  Asthma.   I had the pleasure of meeting [name] today in the adult intensive  care unit at [facility].  She was admitted on May 11, 2005 with  preeclampsia and she was [redacted] weeks pregnant.  She had to subsequently undergo  emergent cesarean section on May 13, 2005 for severe eclampsia.  Postoperatively she had developed complaints of coughing with clear to  greenish sputum, chest tightness, and wheezing.  She does have a history of  asthma which appeared to be mild, intermittent in nature and was only using  albuterol before on a p.r.n. basis, although she has required the use of  prednisone previously, but not for several years.  She had a chest x-ray  done today which did not reveal any lung infiltrates, but showed mild  bilateral pleural effusions, although she does have a decreased serum  albumin.  She has been getting albuterol inhaler treatments as well as she  has been started on Avelox for antibiotic coverage.   PAST MEDICAL HISTORY:  1.  Eclampsia.  2.  Asthma.   She has allergies to PENICILLIN, TETRACYCLINE, and ERYTHROMYCIN.   FAMILY HISTORY:  Significant for breast cancer.   CURRENT MEDICATIONS:  1.  Avelox 400 mg daily.  2.  Mylicon 80 mg q.h.s.  3.  Normodyne 200 mg p.o. b.i.d.  4.  Senokot one to two tablets p.o. q.h.s.  5.  Ventolin nebulizer q.6h.  6.  Ambien p.r.n.  7.  Benadryl p.r.n.  8.  Dilaudid p.r.n.  9.  Ibuprofen p.r.n.   PHYSICAL EXAMINATION:  VITAL SIGNS:  Her blood pressure is 175/90, heart  rate was 89 and  regular, respirations were 21.  Oxygen saturation was 93-95%  on room air at rest.  HEENT:  There was no sinus tenderness, clear nasal discharge.  No oral  lesions.  No lymphadenopathy.  No thyromegaly.  HEART:  S1, S2.  Regular rate and rhythm.  CHEST:  Prolonged expiratory phase.  Decreased air entry with bilateral  diffuse expiratory wheezes.  ABDOMEN:  Soft, mildly tender.  She is post surgical for cesarean section.  EXTREMITIES:  There is no edema, cyanosis, clubbing.  NEUROLOGIC:  She is awake, alert, and oriented with 5/5 strength.   LABORATORIES:  WBC 10.9, hemoglobin 10, hematocrit 29.6, platelet count 234.  Sodium is 138, potassium 3.3, chloride 98, CO2 27, BUN 2, creatinine 0.7,  glucose 121.  AST is 25, ALT is 13, ALP is 129, bilirubin 0.3, albumin 1.8.  Calcium is 6.5, magnesium is 5.4.  As stated above, review of the chest x-  ray showed small bilateral effusions, otherwise no acute infiltrates.  IMPRESSION:  Acute asthma exacerbation in this 55 year old female who is  status post cesarean section for severe eclampsia.  At this time I would  start her on prednisone 40 mg daily as well as Advair 500/50 one puff b.i.d.  and monitor her clinical response to this.  We will also have respiratory  therapy use a flutter valve to help mobilize secretions.  I would also  additionally continue her on her Ventolin inhalers on a scheduled basis  q.6h. and check her peak flow meter b.i.d. and then I would also continue  her on her course of Avelox to complete a seven-day course of antibiotics  and then I will monitor her clinical response and make adjustments in her  medications accordingly.  Thank you very much for allowing [place] to share in  the care of this patient.      Chesley Mires, M.D.  Electronically Signed     VS/MEDQ  D:  05/15/2005  T:  05/15/2005  Job:  [id number]

## 2010-11-29 NOTE — Op Note (Signed)
NAMEBREAN, [name]           ACCOUNT NO.:  [id number]   MEDICAL RECORD NO.:  [id number]          PATIENT TYPE:  INP   LOCATION:  3267                          FACILITY:  [facility]   PHYSICIAN:  Willey Blade, MD  DATE OF BIRTH:  August 19, 1955   DATE OF PROCEDURE:  05/13/2005  DATE OF DISCHARGE:                                 OPERATIVE REPORT   PREOPERATIVE DIAGNOSIS:  1.  Thirty-five plus weeks gestation.  2.  Severe preeclampsia.  3.  Repeat cesarean section.  4.  Request for sterilization.   POSTOPERATIVE DIAGNOSES:  1.  Thirty-five plus weeks gestation.  2.  Severe preeclampsia.  3.  Repeat cesarean section.  4.  Request for sterilization.  5.  Preterm viable delivery of a female infant.   OPERATION/PROCEDURE:  1.  Repeat low transverse cesarean section.  2.  Pomeroy bilateral tubal ligation.   SURGEON:  Willey Blade, M.D.   ASSISTANT:  None.   COMPLICATIONS:  None.   ESTIMATED BLOOD LOSS:  600 mL.   ANESTHESIA:  Epidural.   SPECIMENS:  Right and left tubes.  Cord Blood   OPERATIVE FINDINGS:  Preterm infant female, delivered in the early evening  of May 13, 2005.  For Apgars and weight, refer to delivery room records.  No gross abnormalities.  Baby cried spontaneously at delivery.  Amniotic  fluid was clear.  Vertex presentation.  NICU present.  Uterus, tubes and  ovaries showed normal decidual changes of pregnancy.  Placenta demonstrated  succenturiate lobe, three vessels for the umbilical cord and central  insertion.  Uterus inspected and found to be clean following removal of  placenta.   DESCRIPTION OF PROCEDURE:  The patient was prepped and draped in the usual  fashion and placed in the left lateral supine position.  Betadine solution  was used for antiseptic and the patient was catheterized prior to the  procedure.  After adequate epidural analgesia, a Pfannenstiel incision was  made and the abdomen opened.  Lower uterine segment incised  transversely  after developing the bladder flap.  Baby delivered, cord was clamped and cut  and infant given to the pediatrics staff after bulb suctioning.  Placenta  removed manually.  Uterus inspected.  Cord blood sent to pathology.  Closure  of the uterine musculature in one layer of 0 Vicryl running interlocking  suture.   Pomeroy bilateral tubal ligation performed by grasping the isthmus ampullary  portion of both tubes.  Approximately 3 cm of tube were incorporated in two  2-0 plain catgut suture ties.  Tubes severed above said knot and sent  separately to pathology.  The tips of the severed tubes were cauterized.   Bleeding  points were hemostatically checked.  Blood clots removed from the  abdomen.  Closure of the parietal peritoneum with 2-0 Vicryl running suture,  0 Vicryl running for the fascia, and skin staples for the skin.  Instrument  and sponge counts were correct.  The patient tolerated the procedure well  and returned to the post anesthesia recovery room in excellent condition.      Willey Blade, MD  Electronically  Signed     SHB/MEDQ  D:  05/13/2005  T:  05/14/2005  Job:  [id number]

## 2010-11-29 NOTE — H&P (Signed)
NAMESOMA, BACHAND           ACCOUNT NO.:  [id number]   MEDICAL RECORD NO.:  [id number]          PATIENT TYPE:  INP   LOCATION:  4782                          FACILITY:  [facility]   PHYSICIAN:  Willey Blade, MD  DATE OF BIRTH:  05/06/1956   DATE OF ADMISSION:  05/11/2005  DATE OF DISCHARGE:                                HISTORY & PHYSICAL   REASON FOR HOSPITALIZATION:  A 35-1/[redacted] weeks gestation with 3+ proteinuria  and blood pressure of 148/90.   HISTORY OF PRESENT ILLNESS:  This patient is a 54 year old gravida 4, para 1-  0-2-1 African-American female, [facility] June 14, 2005, 35-1/[redacted] weeks gestation,  admitted with a blood pressure consistently of 956 to 213 systolic and 86 to  96 diastolic. The patient had not had evidence of proteinuria in the office,  however, on admission to the maternity admissions unit today, her blood  pressure was 148/86 with evidence of 3+ proteinuria on spot urinalysis.  Remainder of [place] labs were within normal limits. She denies headache, blurry  vision, right upper quadrant pain or epigastric pain.   The patient had an ultrasound obtained at [facility] on  May 06, 2005. At that time, assigned gestational age was 33-6/[redacted] weeks  gestation. Estimated fetal weight was 25th to 50th percentile with an  abdominal circumference lagging by approximately 2 weeks. There was no  evidence of gross abnormalities.   OB/GYN HISTORY:  The patient has had 2 miscarriages in [place] and a  full term repeat cesarean section in 1996.   PAST MEDICAL HISTORY:  Negative for chronic hypertension, diabetes, or other  specific abnormalities.   PAST SURGICAL HISTORY:  Per above. Primary cesarean section.   CURRENT MEDICATIONS:  Prenatal vitamins.   ALLERGIES:  PENICILLIN, TETRACYCLINE, ERYTHROMYCIN.   REVIEW OF SYSTEMS:  Negative.   FAMILY HISTORY:  Negative for first-degree relatives with any inheritable  known diseases or first degree  relatives with breast cancer.   SOCIAL HISTORY:  Negative for smoking, illicit drug abuse, or alcohol abuse.   OBSTETRICAL LABORATORY PARAMETERS:  Positive Rubella immune, quad test was  positive with a negative level 3 ultrasound. The quad test should be listed  as positive with risk for Down syndrome. The patient declined an  amniocentesis.   PHYSICAL EXAMINATION:  GENERAL:  A pleasant female with evidence of  coughing.  VITAL SIGNS:  Blood pressure 148/90, height 5 feet 2 inches, weight 150  pounds.  HEENT:  Grossly normal.  CHEST:  Demonstrates wheezing bilaterally, upper and lower bases, without  consolidation.  CARDIAC:  Without murmurs or enlargements. Regular rate and rhythm.  EXTREMITIES/LYMPHATICS/SKIN/NEUROLOGIC/MUSCULOSKELETAL:  Systems normal.  ABDOMEN:  Full-term pregnancy of [redacted] weeks gestation.  PELVIC:  External genitalia, vulva, and vagina are normal. Cervix is long,  closed, and thick with a Bishop's score of 0/10.   IMPRESSION:  1.  Mild pre-eclampsia with proteinuria.  2.  Bronchitis.   PLAN:  1.  The patient will be monitored for 2 to 3 days, to observe any changes      related to her proteinuria and blood pressure and  [place] labs. At this      time, she has no pre-eclamptic symptomatology.  2.  The patient will be treated for bronchitis with a Z-Pak in addition to      respiratory inhalational treatments with albuterol.  3.  At this time, a followup ultrasound BPP will be performed.      Willey Blade, MD  Electronically Signed     SHB/MEDQ  D:  05/11/2005  T:  05/11/2005  Job:  [id number]

## 2011-04-05 ENCOUNTER — Emergency Department (HOSPITAL_COMMUNITY)
Admission: EM | Admit: 2011-04-05 | Discharge: 2011-04-06 | Disposition: A | Discharge status: Home / Self Care | Payer: BC Managed Care – PPO | Attending: Emergency Medicine | Admitting: Emergency Medicine

## 2011-04-05 DIAGNOSIS — J45909 Unspecified asthma, uncomplicated: Secondary | ICD-10-CM | POA: Insufficient documentation

## 2011-04-05 DIAGNOSIS — R110 Nausea: Secondary | ICD-10-CM | POA: Insufficient documentation

## 2011-04-05 DIAGNOSIS — K5190 Ulcerative colitis, unspecified, without complications: Secondary | ICD-10-CM | POA: Insufficient documentation

## 2011-04-05 DIAGNOSIS — H53149 Visual discomfort, unspecified: Secondary | ICD-10-CM | POA: Insufficient documentation

## 2011-04-05 DIAGNOSIS — R51 Headache: Secondary | ICD-10-CM | POA: Insufficient documentation

## 2011-04-18 LAB — COMPREHENSIVE METABOLIC PANEL
ALT: 30 U/L (ref 0–35)
AST: 22 U/L (ref 0–37)
Albumin: 3.3 g/dL — ABNORMAL LOW (ref 3.5–5.2)
Chloride: 104 mEq/L (ref 96–112)
Creatinine, Ser: 1 mg/dL (ref 0.4–1.2)
GFR calc Af Amer: >60 mL/min (ref >60)
Potassium: 3.2 mEq/L — ABNORMAL LOW (ref 3.5–5.1)
Sodium: 140 mEq/L (ref 135–145)
Total Bilirubin: 0.5 mg/dL (ref 0.3–1.2)

## 2011-04-18 LAB — URINE MICROSCOPIC-ADD ON

## 2011-04-18 LAB — URINALYSIS, ROUTINE W REFLEX MICROSCOPIC
Glucose, UA: NEGATIVE mg/dL
Specific Gravity, Urine: 1.028 (ref 1.005–1.030)

## 2011-04-18 LAB — DIFFERENTIAL
Basophils Absolute: 0 10*3/uL (ref 0.0–0.1)
Basophils Relative: 0 % (ref 0–1)
Eosinophils Absolute: 0 10*3/uL (ref 0.0–0.7)
Eosinophils Absolute: 0.1 10*3/uL (ref 0.0–0.7)
Eosinophils Relative: 2 % (ref 0–5)
Lymphocytes Relative: 25 % (ref 12–46)
Monocytes Absolute: 0.8 10*3/uL (ref 0.1–1.0)
Monocytes Relative: 6 % (ref 3–12)
Neutrophils Relative %: 82 % — ABNORMAL HIGH (ref 43–77)

## 2011-04-18 LAB — BASIC METABOLIC PANEL
BUN: 3 mg/dL — ABNORMAL LOW (ref 6–23)
CO2: 27 mEq/L (ref 19–32)
Chloride: 105 mEq/L (ref 96–112)
Creatinine, Ser: 0.68 mg/dL (ref 0.4–1.2)
Glucose, Bld: 141 mg/dL — ABNORMAL HIGH (ref 70–99)

## 2011-04-18 LAB — CBC
MCHC: 32.8 g/dL (ref 30.0–36.0)
MCV: 86.9 fL (ref 78.0–100.0)
MCV: 87.4 fL (ref 78.0–100.0)
Platelets: 256 10*3/uL (ref 150–400)
Platelets: 273 10*3/uL (ref 150–400)
RBC: 4.18 MIL/uL (ref 3.87–5.11)
WBC: 6.4 10*3/uL (ref 4.0–10.5)

## 2011-07-15 HISTORY — PX: EYE SURGERY: SHX253

## 2011-12-17 ENCOUNTER — Emergency Department (HOSPITAL_COMMUNITY): Payer: BC Managed Care – PPO

## 2011-12-17 ENCOUNTER — Emergency Department (HOSPITAL_COMMUNITY)
Admission: EM | Admit: 2011-12-17 | Discharge: 2011-12-18 | Disposition: A | Discharge status: Home / Self Care | Payer: BC Managed Care – PPO | Attending: Emergency Medicine | Admitting: Emergency Medicine

## 2011-12-17 ENCOUNTER — Encounter (HOSPITAL_COMMUNITY): Payer: Self-pay | Admitting: Emergency Medicine

## 2011-12-17 DIAGNOSIS — J45909 Unspecified asthma, uncomplicated: Secondary | ICD-10-CM | POA: Insufficient documentation

## 2011-12-17 DIAGNOSIS — R05 Cough: Secondary | ICD-10-CM | POA: Insufficient documentation

## 2011-12-17 DIAGNOSIS — R059 Cough, unspecified: Secondary | ICD-10-CM | POA: Insufficient documentation

## 2011-12-17 DIAGNOSIS — J40 Bronchitis, not specified as acute or chronic: Secondary | ICD-10-CM

## 2011-12-17 HISTORY — DX: Other seasonal allergic rhinitis: J30.2

## 2011-12-17 HISTORY — DX: Unspecified asthma, uncomplicated: J45.909

## 2011-12-17 NOTE — ED Notes (Signed)
C/o productive cough with yellow sputum and wheezing x 10 days.  States she finished Prednisone Rx on Monday and using inhalers without relief.  States her daughter passed away last week and she has been unable to get symptoms controlled.

## 2011-12-18 MED ORDER — ALBUTEROL SULFATE (5 MG/ML) 0.5% IN NEBU
2.5000 mg | INHALATION_SOLUTION | Freq: Once | RESPIRATORY_TRACT | Status: AC
  Administered 2011-12-18: 2.5 mg via RESPIRATORY_TRACT
  Filled 2011-12-18: qty 0.5

## 2011-12-18 MED ORDER — AZITHROMYCIN 250 MG PO TABS: 250.0000 mg | ORAL_TABLET | Freq: Every day | ORAL | Status: AC

## 2011-12-18 MED ORDER — AZITHROMYCIN 250 MG PO TABS
500.0000 mg | ORAL_TABLET | Freq: Once | ORAL | Status: AC
  Administered 2011-12-18: 500 mg via ORAL
  Filled 2011-12-18: qty 2

## 2011-12-18 NOTE — ED Provider Notes (Signed)
History     CSN: [id number]  Arrival date & time 12/17/11  2156   First MD Initiated Contact with Patient 12/17/11 2310      Chief Complaint  Patient presents with  . Asthma    (Consider location/radiation/quality/duration/timing/severity/associated sxs/prior treatment) HPI 56 year old female presents to emergency department complaining of persistent wheezing and cough productive of yellow sputum. She has had no fevers. Patient recently finished prednisone after being seen by her primary care Dr. last week. Patient reports she's been under a lot of stress recently, as her daughter died last week. Patient with history of asthma/chronic bronchitis. She reports her cough is better with the cough syrup prescribed by her primary care Dr. Brita Romp been using her albuterol inhaler without improvement in symptoms.  Past Medical History  Diagnosis Date  . Asthma   . Seasonal allergies     History reviewed. No pertinent past surgical history.  No family history on file.  History  Substance Use Topics  . Smoking status: Never Smoker   . Smokeless tobacco: Not on file  . Alcohol Use: No    OB History    Grav Para Term Preterm Abortions TAB SAB Ect Mult Living                  Review of Systems  All other systems reviewed and are negative.    Allergies  Erythromycin; Penicillins; and Tetracycline  Home Medications   Current Outpatient Rx  Name Route Sig Dispense Refill  . CYCLOBENZAPRINE HCL 5 MG PO TABS Oral Take 5 mg by mouth 3 (three) times daily as needed.    Marland Kitchen FLUTICASONE PROPIONATE  HFA 110 MCG/ACT IN AERO Inhalation Inhale 1 puff into the lungs daily.    Marland Kitchen HYDROCODONE-HOMATROPINE 5-1.5 MG/5ML PO SYRP Oral Take 5 mLs by mouth every 4 (four) hours as needed. For cough    . PREDNISONE 20 MG PO TABS Oral Take 20 mg by mouth daily.    . AZITHROMYCIN 250 MG PO TABS Oral Take 1 tablet (250 mg total) by mouth daily. 4 tablet 0    BP 112/67  Pulse 103  Temp(Src) 99.9 F  (37.7 C) (Oral)  Resp 16  SpO2 97%  Physical Exam  Nursing note and vitals reviewed. Constitutional: She is oriented to person, place, and time. She appears well-developed and well-nourished.  HENT:  Head: Normocephalic and atraumatic.  Nose: Nose normal.  Mouth/Throat: Oropharynx is clear and moist.  Eyes: Conjunctivae and EOM are normal. Pupils are equal, round, and reactive to light.  Neck: Normal range of motion. Neck supple. No JVD present. No tracheal deviation present. No thyromegaly present.  Cardiovascular: Normal rate, regular rhythm, normal heart sounds and intact distal pulses.  Exam reveals no gallop and no friction rub.   No murmur heard. Pulmonary/Chest: Effort normal. No stridor. No respiratory distress. She has wheezes (Mild diffuse wheezes left greater than right, cough noted). She has no rales. She exhibits no tenderness.  Abdominal: Soft. Bowel sounds are normal. She exhibits no distension and no mass. There is no tenderness. There is no rebound and no guarding.  Musculoskeletal: Normal range of motion. She exhibits no edema and no tenderness.  Lymphadenopathy:    She has no cervical adenopathy.  Neurological: She is oriented to person, place, and time. She has normal reflexes. She exhibits normal muscle tone. Coordination normal.  Skin: Skin is dry. No rash noted. No erythema. No pallor.  Psychiatric: She has a normal mood and affect. Her behavior  is normal. Judgment and thought content normal.    ED Course  Procedures (including critical care time)  Labs Reviewed - No data to display Dg Chest 2 View  12/17/2011  *RADIOLOGY REPORT*  Clinical Data: Cough, congestion and wheezing.  CHEST - 2 VIEW  Comparison: Chest x-ray 05/20/2009.  Findings: Lung volumes are normal.  No consolidative airspace disease.  No pleural effusions.  No pneumothorax.  No pulmonary nodule or mass noted.  Pulmonary vasculature and the cardiomediastinal silhouette are within normal limits.   IMPRESSION: 1. No radiographic evidence of acute cardiopulmonary disease.  Original Report Authenticated By: Etheleen Mayhew, M.D.     1. Bronchitis       MDM  55 year old female with persistent cough and wheezing despite recent treatment with steroids and albuterol. We'll start Z-Pak for potential bacterial bronchitis.        Kalman Drape, MD 12/18/11 [phone]

## 2011-12-18 NOTE — Discharge Instructions (Signed)
Please take antibiotics as prescribed. Followup with your doctor for recheck in 2-3 days. Return to emergency department for worsening condition or new concerning symptoms.  Bronchitis Bronchitis is the body's way of reacting to injury and/or infection (inflammation) of the bronchi. Bronchi are the air tubes that extend from the windpipe into the lungs. If the inflammation becomes severe, it may cause shortness of breath. CAUSES  Inflammation may be caused by:  A virus.   Germs (bacteria).   Dust.   Allergens.   Pollutants and many other irritants.  The cells lining the bronchial tree are covered with tiny hairs (cilia). These constantly beat upward, away from the lungs, toward the mouth. This keeps the lungs free of pollutants. When these cells become too irritated and are unable to do their job, mucus begins to develop. This causes the characteristic cough of bronchitis. The cough clears the lungs when the cilia are unable to do their job. Without either of these protective mechanisms, the mucus would settle in the lungs. Then you would develop pneumonia. Smoking is a common cause of bronchitis and can contribute to pneumonia. Stopping this habit is the single most important thing you can do to help yourself. TREATMENT   Your caregiver may prescribe an antibiotic if the cough is caused by bacteria. Also, medicines that open up your airways make it easier to breathe. Your caregiver may also recommend or prescribe an expectorant. It will loosen the mucus to be coughed up. Only take over-the-counter or prescription medicines for pain, discomfort, or fever as directed by your caregiver.   Removing whatever causes the problem (smoking, for example) is critical to preventing the problem from getting worse.   Cough suppressants may be prescribed for relief of cough symptoms.   Inhaled medicines may be prescribed to help with symptoms now and to help prevent problems from returning.   For those  with recurrent (chronic) bronchitis, there may be a need for steroid medicines.  SEEK IMMEDIATE MEDICAL CARE IF:   During treatment, you develop more pus-like mucus (purulent sputum).   You have a fever.   Your baby is older than 3 months with a rectal temperature of 102 F (38.9 C) or higher.   Your baby is 74 months old or younger with a rectal temperature of 100.4 F (38 C) or higher.   You become progressively more ill.   You have increased difficulty breathing, wheezing, or shortness of breath.  It is necessary to seek immediate medical care if you are elderly or sick from any other disease. MAKE SURE YOU:   Understand these instructions.   Will watch your condition.   Will get help right away if you are not doing well or get worse.  Document Released: 06/30/2005 Document Revised: 06/19/2011 Document Reviewed: 05/09/2008 [facility] Patient Information 2012 [place].

## 2011-12-18 NOTE — ED Notes (Signed)
Called respiratory to give albuterol treatment.

## 2011-12-18 NOTE — ED Notes (Signed)
Prescription given with discharge instructions.

## 2012-11-15 ENCOUNTER — Encounter (INDEPENDENT_AMBULATORY_CARE_PROVIDER_SITE_OTHER): Payer: Self-pay

## 2012-11-18 ENCOUNTER — Encounter: Payer: BC Managed Care – PPO | Attending: *Deleted

## 2012-11-19 ENCOUNTER — Ambulatory Visit (INDEPENDENT_AMBULATORY_CARE_PROVIDER_SITE_OTHER): Payer: BC Managed Care – PPO | Admitting: General Surgery

## 2012-11-19 ENCOUNTER — Telehealth (INDEPENDENT_AMBULATORY_CARE_PROVIDER_SITE_OTHER): Payer: Self-pay | Admitting: General Surgery

## 2012-11-19 ENCOUNTER — Encounter (INDEPENDENT_AMBULATORY_CARE_PROVIDER_SITE_OTHER): Payer: Self-pay | Admitting: General Surgery

## 2012-11-19 VITALS — BP 132/80 | HR 79 | Temp 97.9°F | Resp 18 | Ht 62.0 in | Wt 157.0 lb

## 2012-11-19 DIAGNOSIS — K8010 Calculus of gallbladder with chronic cholecystitis without obstruction: Secondary | ICD-10-CM

## 2012-11-19 DIAGNOSIS — K828 Other specified diseases of gallbladder: Secondary | ICD-10-CM | POA: Insufficient documentation

## 2012-11-19 NOTE — Telephone Encounter (Signed)
The pt called back.   I gave her the details of her CT scan at [facility] on 5/13.  I told her she needs to pick up her contrast today or Monday.  She will come Monday.

## 2012-11-19 NOTE — Patient Instructions (Signed)
We will order CT scan.  If we find something that alters our plan from just taking out the gallbladder, I will have you come back to discuss that.   IF YOU ARE TAKING ASPIRIN, COUMADIN/WARFARIN, PLAVIX, OR OTHER BLOOD THINNER, PLEASE LET us KNOW IMMEDIATELY.  WE WILL NEED TO DISCUSS WITH THE PRESCRIBING PROVIDER IF THESE ARE SAFE TO STOP. IF THESE ARE NOT STOPPED AT THE APPROPRIATE TIME, THIS WILL RESULT IN A DELAY FOR YOUR SURGERY.  DO NOT TAKE THESE MEDICATIONS OR IBUPROFEN/NAPROXEN WITHIN A WEEK BEFORE SURGERY.   Usually we are able to remove the gallbladder with the laparoscopic equipment (minimally invasive).  If the anatomy is unclear or if there is severe infection or scar tissue we may need to make a larger incision to remove the gallbladder safely.  Some patients have evidence of gallstones remaining in their ducts that are not able to cleared surgically and may need ERCP (endoscopy) to remove them in the few days following surgery.     Otherwise, the main risks of surgery are bleeding, infection, damage to other structures, and hernia at the  incision sites.    These complications may lead to additional procedures such as drain placement or endoscopy, and in rare cases may lead to other surgeries.   These are not common risks, but do occur.     Most patients have some constipation in the week after surgery.  You may need over the counter stool softeners or laxatives if you experience difficulty having bowel movements.    Some patients experience diarrhea or experience a need to have a bowel movement shortly after eating.  Please discuss this with me when you come back if that occurs because you may require medication if it is severe.    If the following occur, call our office at [phone]: If you have a fever over 101 or pain that is severe despite narcotics. If you have redness or drainage at the wound. If your stools become clay-colored or you become jaundiced (yellow  skin/eyes) If you develop persistent nausea or vomiting.  I will follow you back up in 3-4 weeks.    Please submit any paperwork about time off work/insurance forms to the front desk.

## 2012-11-19 NOTE — Assessment & Plan Note (Signed)
Patient appears to have chronic cholecystitis based on the chronic pain that she is having after she eats and tenderness.  I will plan to perform a laparoscopic cholecystectomy on her.  She is scheduled to get a colonoscopy for ulcerative colitis. I have asked this to be moved up a week. Since she has diarrhea, constipation or she doesn't have issues that need to be addressed prior to surgery. I would not want to operate on her in the middle of an active layer of ulcerative colitis. I also will make attempts to get a cholangiogram as she may have primary biliary cirrhosis or primary sclerosing cholangitis that is contributing to the elevated LFTs that she had previously.

## 2012-11-19 NOTE — Telephone Encounter (Signed)
Left message with patient voicemail  She has a  appt 11/23/12 10:15

## 2012-11-19 NOTE — Addendum Note (Signed)
Addended by: Jeralyn Ruths on: 11/19/2012 10:26 AM   Modules accepted: Orders

## 2012-11-19 NOTE — Progress Notes (Signed)
Chief Complaint  Patient presents with  . New Evaluation    Chole    HISTORY: Patient is a 57 year old female who presents with elevated liver function tests. She is followed by Dr. Benson Norway for ulcerative colitis.  She is scheduled for a colonoscopy in a few weeks based on diarrhea. She isn't currently on any treatment for ulcerative colitis. Because of the elevated liver function tests, she underwent right upper quadrant ultrasound. This demonstrated diffuse steatosis and shadowing from the gallbladder fossa securing visualization of the gallbladder. This is felt to be porcelain gallbladder or gallbladder full of stones.  She originally said that she is asymptomatic, but upon further questioning, she has been developing right upper quadrant and epigastric pain and nausea after she eats. This is worse with fatty foods. She describes the pain as going on for around 2 months. She denies diarrhea being bloody.  Past Medical History  Diagnosis Date  . Asthma   . Seasonal allergies     Past Surgical History  Procedure Laterality Date  . Cesarean section  1997/2006    Current Outpatient Prescriptions  Medication Sig Dispense Refill  . atorvastatin (LIPITOR) 20 MG tablet Take 20 mg by mouth daily.      Marland Kitchen esomeprazole (NEXIUM) 40 MG capsule Take 40 mg by mouth daily before breakfast.      . fluticasone (FLOVENT HFA) 110 MCG/ACT inhaler Inhale 1 puff into the lungs daily.      Marland Kitchen HYDROcodone-homatropine (HYCODAN) 5-1.5 MG/5ML syrup Take 5 mLs by mouth every 4 (four) hours as needed. For cough      . metFORMIN (GLUCOPHAGE) 500 MG tablet Take 500 mg by mouth 2 (two) times daily with a meal.      . cyclobenzaprine (FLEXERIL) 5 MG tablet Take 5 mg by mouth 3 (three) times daily as needed.      . predniSONE (DELTASONE) 20 MG tablet Take 20 mg by mouth daily.       No current facility-administered medications for this visit.     Allergies  Allergen Reactions  . Erythromycin   . Penicillins   .  Tetracycline      Family History  Problem Relation Age of Onset  . Hypertension Mother   . Diabetes Mother   . Cancer Father     lung ca  . Seizures Daughter     71yr     History   Social History  . Marital Status: Married    Spouse Name: N/A    Number of Children: N/A  . Years of Education: N/A   Social History Main Topics  . Smoking status: Never Smoker   . Smokeless tobacco: None  . Alcohol Use: No  . Drug Use: No  . Sexually Active: None   Other Topics Concern  . None   Social History Narrative  . None     REVIEW OF SYSTEMS - PERTINENT POSITIVES ONLY: 12 point review of systems negative other than HPI and PMH  EXAM: Filed Vitals:   11/19/12 0917  BP: 132/80  Pulse: 79  Temp: 97.9 F (36.6 C)  Resp: 18    Gen:  No acute distress.  Well nourished and well groomed.   Neurological: Alert and oriented to person, place, and time. Coordination normal.  Head: Normocephalic and atraumatic.  Eyes: Conjunctivae are normal. Pupils are equal, round, and reactive to light. No scleral icterus.  Neck: Normal range of motion. Neck supple. No tracheal deviation or thyromegaly present.  Cardiovascular: Normal rate, regular  rhythm, normal heart sounds and intact distal pulses.  Exam reveals no gallop and no friction rub.  No murmur heard. Respiratory: Effort normal.  No respiratory distress. No chest wall tenderness. Breath sounds normal.  No wheezes, rales or rhonchi.  GI: Soft. Bowel sounds are normal. The abdomen is soft.  There is diffuse tenderness, but more in RUQ.  There is no rebound and no guarding.  Musculoskeletal: Normal range of motion. Extremities are nontender.  Lymphadenopathy: No cervical, preauricular, postauricular or axillary adenopathy is present Skin: Skin is warm and dry. No rash noted. No diaphoresis. No erythema. No pallor. No clubbing, cyanosis, or edema.   Psychiatric: Normal mood and affect. Behavior is normal. Judgment and thought content  normal.    LABORATORY RESULTS: Available labs are reviewed  Alk phos 164, AST 112, ALT 128, T Bili 0.4, Cr 0.73  RADIOLOGY RESULTS: See E-Chart or I-Site for most recent results.  Images and reports are reviewed. RUQ ultrasound  Steatosis, porcelain gallbladder or gallbladder with with stones.     ASSESSMENT AND PLAN: Porcelain gallbladder Patient was thought to have possible porcelain gallbladder on her ultrasound. CT scan is recommended for evaluation. If she truly has a porcelain gallbladder, we will approach her surgery what differently. If her gallbladder has a calcified wall, I will send this for frozen section to make sure there is no malignancy. I am also going to get a CT scan and make sure there's no gallbladder mass present.  Chronic cholecystitis with calculus Patient appears to have chronic cholecystitis based on the chronic pain that she is having after she eats and tenderness.  I will plan to perform a laparoscopic cholecystectomy on her.  She is scheduled to get a colonoscopy for ulcerative colitis. I have asked this to be moved up a week. Since she has diarrhea, constipation or she doesn't have issues that need to be addressed prior to surgery. I would not want to operate on her in the middle of an active layer of ulcerative colitis. I also will make attempts to get a cholangiogram as she may have primary biliary cirrhosis or primary sclerosing cholangitis that is contributing to the elevated LFTs that she had previously.     Milus Height MD Surgical Oncology, General and [facility]      Visit Diagnoses: 1. Porcelain gallbladder   2. Chronic cholecystitis with calculus     Primary Care Physician: Aurora Mask, NP

## 2012-11-19 NOTE — Assessment & Plan Note (Signed)
Patient was thought to have possible porcelain gallbladder on her ultrasound. CT scan is recommended for evaluation. If she truly has a porcelain gallbladder, we will approach her surgery what differently. If her gallbladder has a calcified wall, I will send this for frozen section to make sure there is no malignancy. I am also going to get a CT scan and make sure there's no gallbladder mass present.

## 2012-11-22 ENCOUNTER — Encounter (HOSPITAL_COMMUNITY): Payer: Self-pay | Admitting: Pharmacy Technician

## 2012-11-23 ENCOUNTER — Ambulatory Visit
Admission: RE | Admit: 2012-11-23 | Discharge: 2012-11-23 | Disposition: A | Discharge status: Home / Self Care | Payer: BC Managed Care – PPO | Source: Ambulatory Visit | Attending: General Surgery | Admitting: General Surgery

## 2012-11-23 DIAGNOSIS — K828 Other specified diseases of gallbladder: Secondary | ICD-10-CM

## 2012-11-23 MED ORDER — IOHEXOL 300 MG/ML  SOLN
100.0000 mL | Freq: Once | INTRAMUSCULAR | Status: AC | PRN
  Administered 2012-11-23: 100 mL via INTRAVENOUS

## 2012-11-26 ENCOUNTER — Inpatient Hospital Stay (HOSPITAL_COMMUNITY): Admission: RE | Admit: 2012-11-26 | Payer: BC Managed Care – PPO | Source: Ambulatory Visit

## 2012-11-26 ENCOUNTER — Telehealth (INDEPENDENT_AMBULATORY_CARE_PROVIDER_SITE_OTHER): Payer: Self-pay | Admitting: *Deleted

## 2012-11-26 NOTE — Telephone Encounter (Signed)
Patient called asking for CT results.

## 2012-11-26 NOTE — Telephone Encounter (Signed)
Patient called back with note that Byerly MD had left on CT scan results. CT is OK, just shows gallbladder stones, nothing else concerning.  Patient states understanding at this time.

## 2012-11-29 ENCOUNTER — Encounter (HOSPITAL_COMMUNITY)
Admission: RE | Admit: 2012-11-29 | Discharge: 2012-11-29 | Disposition: A | Discharge status: Home / Self Care | Payer: BC Managed Care – PPO | Source: Ambulatory Visit | Attending: General Surgery | Admitting: General Surgery

## 2012-11-29 ENCOUNTER — Encounter (HOSPITAL_COMMUNITY): Payer: Self-pay

## 2012-11-29 DIAGNOSIS — E119 Type 2 diabetes mellitus without complications: Secondary | ICD-10-CM

## 2012-11-29 HISTORY — DX: Ulcerative colitis, unspecified, without complications: K51.90

## 2012-11-29 HISTORY — DX: Type 2 diabetes mellitus without complications: E11.9

## 2012-11-29 LAB — CBC WITH DIFFERENTIAL/PLATELET
Basophils Absolute: 0 10*3/uL (ref 0.0–0.1)
HCT: 35.8 % — ABNORMAL LOW (ref 36.0–46.0)
Hemoglobin: 12 g/dL (ref 12.0–15.0)
Lymphocytes Relative: 38 % (ref 12–46)
Monocytes Absolute: 0.5 10*3/uL (ref 0.1–1.0)
Monocytes Relative: 8 % (ref 3–12)
Neutro Abs: 3.2 10*3/uL (ref 1.7–7.7)
Neutrophils Relative %: 52 % (ref 43–77)
RDW: 13.1 % (ref 11.5–15.5)
WBC: 6.1 10*3/uL (ref 4.0–10.5)

## 2012-11-29 LAB — URINALYSIS, ROUTINE W REFLEX MICROSCOPIC
Bilirubin Urine: NEGATIVE
Glucose, UA: 250 mg/dL — AB
Ketones, ur: NEGATIVE mg/dL
Nitrite: NEGATIVE
Specific Gravity, Urine: 1.019 (ref 1.005–1.030)
pH: 6 (ref 5.0–8.0)

## 2012-11-29 LAB — COMPREHENSIVE METABOLIC PANEL
AST: 16 U/L (ref 0–37)
Alkaline Phosphatase: 140 U/L — ABNORMAL HIGH (ref 39–117)
CO2: 30 mEq/L (ref 19–32)
Chloride: 102 mEq/L (ref 96–112)
Creatinine, Ser: 0.74 mg/dL (ref 0.50–1.10)
GFR calc non Af Amer: >90 mL/min (ref >90)
Potassium: 3.4 mEq/L — ABNORMAL LOW (ref 3.5–5.1)
Total Bilirubin: 0.3 mg/dL (ref 0.3–1.2)

## 2012-11-29 LAB — URINE MICROSCOPIC-ADD ON

## 2012-11-29 LAB — APTT: aPTT: 29 seconds (ref 24–37)

## 2012-11-29 LAB — PROTIME-INR: INR: 0.95 (ref 0.00–1.49)

## 2012-11-29 NOTE — Pre-Procedure Instructions (Signed)
11-29-12 EKG done today. CXR-6'13-Epic.

## 2012-11-29 NOTE — Patient Instructions (Addendum)
[facility]  11/29/2012   Your procedure is scheduled on:  5-21 -2014  Report to [facility] at  0900      AM   Call this number if you have problems the morning of surgery: [phone]  Or Presurgical Testing [phone](Lamia Mariner)      Do not eat food:After Midnight.    Take these medicines the morning of surgery with A SIP OF WATER: Atorvastatin. Nexium. Bring Flovent and use AM of and bring. Do not take any Diabetic meds Am of.   Do not wear jewelry, make-up or nail polish.  Do not wear lotions, powders, or perfumes. You may wear deodorant.  Do not shave 12 hours prior to first CHG shower(legs and under arms).(face and neck okay.)  Do not bring valuables to the hospital.  Contacts, dentures or bridgework,body piercing,  may not be worn into surgery.  Leave suitcase in the car. After surgery it may be brought to your room.  For patients admitted to the hospital, checkout time is 11:00 AM the day of discharge.   Patients discharged the day of surgery will not be allowed to drive home. Must have responsible person with you x 24 hours once discharged.  Name and phone number of your driver: Harrell Gave, son- [phone] cell  Special Instructions: CHG(Chlorhedine 4%-"Hibiclens","Betasept","Aplicare") Environmental consultant Wash: see special instructions.(avoid face and genitals)   Please read over the following fact sheets that you were given: MRSA Information, Blood Transfusion fact sheet, Incentive Spirometry Instruction.    Failure to follow these instructions may result in Cancellation of your surgery.   Patient signature_______________________________________________________

## 2012-12-01 ENCOUNTER — Encounter (HOSPITAL_COMMUNITY): Payer: Self-pay | Admitting: *Deleted

## 2012-12-01 ENCOUNTER — Ambulatory Visit (HOSPITAL_COMMUNITY)
Admission: RE | Admit: 2012-12-01 | Discharge: 2012-12-01 | Disposition: A | Discharge status: Home / Self Care | Payer: BC Managed Care – PPO | Source: Ambulatory Visit | Attending: General Surgery | Admitting: General Surgery

## 2012-12-01 ENCOUNTER — Encounter (HOSPITAL_COMMUNITY): Payer: Self-pay | Admitting: Anesthesiology

## 2012-12-01 ENCOUNTER — Ambulatory Visit (HOSPITAL_COMMUNITY): Payer: BC Managed Care – PPO | Admitting: Anesthesiology

## 2012-12-01 ENCOUNTER — Encounter (HOSPITAL_COMMUNITY)
Admission: RE | Disposition: A | Discharge status: Home / Self Care | Payer: Self-pay | Source: Ambulatory Visit | Attending: General Surgery

## 2012-12-01 DIAGNOSIS — K219 Gastro-esophageal reflux disease without esophagitis: Secondary | ICD-10-CM | POA: Insufficient documentation

## 2012-12-01 DIAGNOSIS — Z79899 Other long term (current) drug therapy: Secondary | ICD-10-CM | POA: Insufficient documentation

## 2012-12-01 DIAGNOSIS — J45909 Unspecified asthma, uncomplicated: Secondary | ICD-10-CM | POA: Insufficient documentation

## 2012-12-01 DIAGNOSIS — E119 Type 2 diabetes mellitus without complications: Secondary | ICD-10-CM | POA: Insufficient documentation

## 2012-12-01 DIAGNOSIS — K8010 Calculus of gallbladder with chronic cholecystitis without obstruction: Secondary | ICD-10-CM | POA: Insufficient documentation

## 2012-12-01 DIAGNOSIS — Z01812 Encounter for preprocedural laboratory examination: Secondary | ICD-10-CM | POA: Insufficient documentation

## 2012-12-01 DIAGNOSIS — K5190 Ulcerative colitis, unspecified, without complications: Secondary | ICD-10-CM | POA: Insufficient documentation

## 2012-12-01 DIAGNOSIS — Z01810 Encounter for preprocedural cardiovascular examination: Secondary | ICD-10-CM | POA: Insufficient documentation

## 2012-12-01 HISTORY — PX: CHOLECYSTECTOMY: SHX55

## 2012-12-01 LAB — GLUCOSE, CAPILLARY: Glucose-Capillary: 148 mg/dL — ABNORMAL HIGH (ref 70–99)

## 2012-12-01 SURGERY — LAPAROSCOPIC CHOLECYSTECTOMY WITH INTRAOPERATIVE CHOLANGIOGRAM
Anesthesia: General | Site: Abdomen | Wound class: Clean Contaminated

## 2012-12-01 MED ORDER — IOHEXOL 300 MG/ML  SOLN
INTRAMUSCULAR | Status: AC
  Filled 2012-12-01: qty 1

## 2012-12-01 MED ORDER — HYDROMORPHONE HCL PF 1 MG/ML IJ SOLN
INTRAMUSCULAR | Status: AC
  Filled 2012-12-01: qty 1

## 2012-12-01 MED ORDER — OXYCODONE HCL 5 MG/5ML PO SOLN: 5.0000 mg | Freq: Once | ORAL | Status: AC | PRN

## 2012-12-01 MED ORDER — ONDANSETRON HCL 4 MG/2ML IJ SOLN
INTRAMUSCULAR | Status: DC | PRN
  Administered 2012-12-01: 4 mg via INTRAVENOUS

## 2012-12-01 MED ORDER — GLYCOPYRROLATE 0.2 MG/ML IJ SOLN
INTRAMUSCULAR | Status: DC | PRN
  Administered 2012-12-01: .8 mg via INTRAVENOUS

## 2012-12-01 MED ORDER — MIDAZOLAM HCL 5 MG/5ML IJ SOLN
INTRAMUSCULAR | Status: DC | PRN
  Administered 2012-12-01: 2 mg via INTRAVENOUS

## 2012-12-01 MED ORDER — BUPIVACAINE-EPINEPHRINE (PF) 0.25% -1:200000 IJ SOLN
INTRAMUSCULAR | Status: DC | PRN
  Administered 2012-12-01: 7 mL

## 2012-12-01 MED ORDER — FENTANYL CITRATE 0.05 MG/ML IJ SOLN
INTRAMUSCULAR | Status: DC | PRN
  Administered 2012-12-01: 100 ug via INTRAVENOUS
  Administered 2012-12-01 (×2): 50 ug via INTRAVENOUS

## 2012-12-01 MED ORDER — DEXAMETHASONE SODIUM PHOSPHATE 10 MG/ML IJ SOLN
INTRAMUSCULAR | Status: DC | PRN
  Administered 2012-12-01: 8 mg via INTRAVENOUS

## 2012-12-01 MED ORDER — SODIUM CHLORIDE 0.9 % IV SOLN: 250.0000 mL | INTRAVENOUS | Status: DC | PRN

## 2012-12-01 MED ORDER — OXYCODONE HCL 5 MG PO TABS
5.0000 mg | ORAL_TABLET | Freq: Once | ORAL | Status: AC | PRN
  Administered 2012-12-01: 5 mg via ORAL
  Filled 2012-12-01: qty 1

## 2012-12-01 MED ORDER — HYDROMORPHONE HCL PF 1 MG/ML IJ SOLN
0.2500 mg | INTRAMUSCULAR | Status: DC | PRN
  Administered 2012-12-01: 0.25 mg via INTRAVENOUS

## 2012-12-01 MED ORDER — ACETAMINOPHEN 10 MG/ML IV SOLN: 1000.0000 mg | Freq: Once | INTRAVENOUS | Status: DC | PRN

## 2012-12-01 MED ORDER — CIPROFLOXACIN IN D5W 400 MG/200ML IV SOLN
INTRAVENOUS | Status: AC
  Filled 2012-12-01: qty 200

## 2012-12-01 MED ORDER — SODIUM CHLORIDE 0.9 % IJ SOLN: 3.0000 mL | Freq: Two times a day (BID) | INTRAMUSCULAR | Status: DC

## 2012-12-01 MED ORDER — SODIUM CHLORIDE 0.9 % IR SOLN
Status: DC | PRN
  Administered 2012-12-01: 1000 mL

## 2012-12-01 MED ORDER — ONDANSETRON HCL 4 MG/2ML IJ SOLN: 4.0000 mg | Freq: Four times a day (QID) | INTRAMUSCULAR | Status: DC | PRN

## 2012-12-01 MED ORDER — NEOSTIGMINE METHYLSULFATE 1 MG/ML IJ SOLN
INTRAMUSCULAR | Status: DC | PRN
  Administered 2012-12-01: 5 mg via INTRAVENOUS

## 2012-12-01 MED ORDER — BUPIVACAINE-EPINEPHRINE 0.25% -1:200000 IJ SOLN
INTRAMUSCULAR | Status: AC
  Filled 2012-12-01: qty 1

## 2012-12-01 MED ORDER — LACTATED RINGERS IR SOLN
Status: DC | PRN
  Administered 2012-12-01: 3000 mL

## 2012-12-01 MED ORDER — OXYCODONE-ACETAMINOPHEN 5-325 MG PO TABS: 1.0000 | ORAL_TABLET | ORAL | Status: DC | PRN

## 2012-12-01 MED ORDER — ACETAMINOPHEN 325 MG PO TABS: 650.0000 mg | ORAL_TABLET | ORAL | Status: DC | PRN

## 2012-12-01 MED ORDER — LIDOCAINE HCL (PF) 1 % IJ SOLN
INTRAMUSCULAR | Status: DC | PRN
  Administered 2012-12-01: 7 mL

## 2012-12-01 MED ORDER — LIDOCAINE HCL 1 % IJ SOLN
INTRAMUSCULAR | Status: AC
  Filled 2012-12-01: qty 20

## 2012-12-01 MED ORDER — PROMETHAZINE HCL 25 MG/ML IJ SOLN: 6.2500 mg | INTRAMUSCULAR | Status: DC | PRN

## 2012-12-01 MED ORDER — CISATRACURIUM BESYLATE (PF) 10 MG/5ML IV SOLN
INTRAVENOUS | Status: DC | PRN
  Administered 2012-12-01: 8 mg via INTRAVENOUS

## 2012-12-01 MED ORDER — PROPOFOL 10 MG/ML IV BOLUS
INTRAVENOUS | Status: DC | PRN
  Administered 2012-12-01: 140 mg via INTRAVENOUS

## 2012-12-01 MED ORDER — SODIUM CHLORIDE 0.9 % IJ SOLN: 3.0000 mL | INTRAMUSCULAR | Status: DC | PRN

## 2012-12-01 MED ORDER — LACTATED RINGERS IV SOLN
INTRAVENOUS | Status: DC
  Administered 2012-12-01: 13:00:00 via INTRAVENOUS
  Administered 2012-12-01: 1000 mL via INTRAVENOUS

## 2012-12-01 MED ORDER — MEPERIDINE HCL 50 MG/ML IJ SOLN: 6.2500 mg | INTRAMUSCULAR | Status: DC | PRN

## 2012-12-01 MED ORDER — ACETAMINOPHEN 650 MG RE SUPP
650.0000 mg | RECTAL | Status: DC | PRN
  Filled 2012-12-01: qty 1

## 2012-12-01 MED ORDER — CIPROFLOXACIN IN D5W 400 MG/200ML IV SOLN
400.0000 mg | INTRAVENOUS | Status: AC
  Administered 2012-12-01: 400 mg via INTRAVENOUS

## 2012-12-01 MED ORDER — OXYCODONE HCL 5 MG PO TABS: 5.0000 mg | ORAL_TABLET | ORAL | Status: DC | PRN

## 2012-12-01 SURGICAL SUPPLY — 40 items
ADH SKN CLS APL DERMABOND .7 (GAUZE/BANDAGES/DRESSINGS) ×1
APPLIER CLIP ROT 10 11.4 M/L (STAPLE) ×2
APR CLP MED LRG 11.4X10 (STAPLE) ×1
BAG SPEC RTRVL LRG 6X4 10 (ENDOMECHANICALS) ×1
CANISTER SUCTION 2500CC (MISCELLANEOUS) ×2 IMPLANT
CHLORAPREP W/TINT 26ML (MISCELLANEOUS) ×2 IMPLANT
CLIP APPLIE ROT 10 11.4 M/L (STAPLE) ×1 IMPLANT
CLOTH BEACON ORANGE TIMEOUT ST (SAFETY) ×2 IMPLANT
CONT SPECI 4OZ STER CLIK (MISCELLANEOUS) IMPLANT
COVER MAYO STAND STRL (DRAPES) IMPLANT
COVER SURGICAL LIGHT HANDLE (MISCELLANEOUS) ×2 IMPLANT
DECANTER SPIKE VIAL GLASS SM (MISCELLANEOUS) ×2 IMPLANT
DERMABOND ADVANCED (GAUZE/BANDAGES/DRESSINGS) ×1
DERMABOND ADVANCED .7 DNX12 (GAUZE/BANDAGES/DRESSINGS) ×1 IMPLANT
DRAPE C-ARM 42X72 X-RAY (DRAPES) IMPLANT
DRAPE LAPAROSCOPIC ABDOMINAL (DRAPES) ×2 IMPLANT
ELECT REM PT RETURN 9FT ADLT (ELECTROSURGICAL) ×2
ELECTRODE REM PT RTRN 9FT ADLT (ELECTROSURGICAL) ×1 IMPLANT
GLOVE BIO SURGEON STRL SZ 6 (GLOVE) ×4 IMPLANT
GLOVE BIOGEL PI IND STRL 6.5 (GLOVE) ×1 IMPLANT
GLOVE BIOGEL PI IND STRL 7.0 (GLOVE) ×1 IMPLANT
GLOVE BIOGEL PI INDICATOR 6.5 (GLOVE) ×1
GLOVE BIOGEL PI INDICATOR 7.0 (GLOVE) ×1
GLOVE INDICATOR 6.5 STRL GRN (GLOVE) ×4 IMPLANT
GOWN PREVENTION PLUS XLARGE (GOWN DISPOSABLE) IMPLANT
GOWN PREVENTION PLUS XXLARGE (GOWN DISPOSABLE) ×2 IMPLANT
GOWN STRL NON-REIN LRG LVL3 (GOWN DISPOSABLE) IMPLANT
HEMOSTAT SURGICEL 4X8 (HEMOSTASIS) IMPLANT
KIT BASIN OR (CUSTOM PROCEDURE TRAY) ×2 IMPLANT
POUCH SPECIMEN RETRIEVAL 10MM (ENDOMECHANICALS) ×2 IMPLANT
SET CHOLANGIOGRAPH MIX (MISCELLANEOUS) IMPLANT
SET IRRIG TUBING LAPAROSCOPIC (IRRIGATION / IRRIGATOR) ×2 IMPLANT
SOLUTION ANTI FOG 6CC (MISCELLANEOUS) ×2 IMPLANT
SUT MNCRL AB 4-0 PS2 18 (SUTURE) ×2 IMPLANT
TOWEL OR 17X26 10 PK STRL BLUE (TOWEL DISPOSABLE) ×4 IMPLANT
TRAY LAP CHOLE (CUSTOM PROCEDURE TRAY) ×2 IMPLANT
TROCAR BLADELESS OPT 5 75 (ENDOMECHANICALS) ×4 IMPLANT
TROCAR XCEL BLUNT TIP 100MML (ENDOMECHANICALS) ×2 IMPLANT
TROCAR XCEL NON-BLD 11X100MML (ENDOMECHANICALS) ×2 IMPLANT
TUBING INSUFFLATION 10FT LAP (TUBING) ×2 IMPLANT

## 2012-12-01 NOTE — Interval H&P Note (Signed)
History and Physical Interval Note:  12/01/2012 11:29 AM  [name]  has presented today for surgery, with the diagnosis of chronic cholecystitis   CT was negative for porcelain gallbladder.  The various methods of treatment have been discussed with the patient and family. After consideration of risks, benefits and other options for treatment, the patient has consented to  Procedure(s): LAPAROSCOPIC CHOLECYSTECTOMY WITH INTRAOPERATIVE CHOLANGIOGRAM (N/A) as a surgical intervention .  The patient's history has been reviewed, patient examined, no change in status, stable for surgery.  I have reviewed the patient's chart and labs.  Questions were answered to the patient's satisfaction.     Hayato Guaman

## 2012-12-01 NOTE — H&P (View-Only) (Signed)
Chief Complaint  Patient presents with  . New Evaluation    Chole    HISTORY: Patient is a 57 year old female who presents with elevated liver function tests. She is followed by Dr. Benson Norway for ulcerative colitis.  She is scheduled for a colonoscopy in a few weeks based on diarrhea. She isn't currently on any treatment for ulcerative colitis. Because of the elevated liver function tests, she underwent right upper quadrant ultrasound. This demonstrated diffuse steatosis and shadowing from the gallbladder fossa securing visualization of the gallbladder. This is felt to be porcelain gallbladder or gallbladder full of stones.  She originally said that she is asymptomatic, but upon further questioning, she has been developing right upper quadrant and epigastric pain and nausea after she eats. This is worse with fatty foods. She describes the pain as going on for around 2 months. She denies diarrhea being bloody.  Past Medical History  Diagnosis Date  . Asthma   . Seasonal allergies     Past Surgical History  Procedure Laterality Date  . Cesarean section  1997/2006    Current Outpatient Prescriptions  Medication Sig Dispense Refill  . atorvastatin (LIPITOR) 20 MG tablet Take 20 mg by mouth daily.      Marland Kitchen esomeprazole (NEXIUM) 40 MG capsule Take 40 mg by mouth daily before breakfast.      . fluticasone (FLOVENT HFA) 110 MCG/ACT inhaler Inhale 1 puff into the lungs daily.      Marland Kitchen HYDROcodone-homatropine (HYCODAN) 5-1.5 MG/5ML syrup Take 5 mLs by mouth every 4 (four) hours as needed. For cough      . metFORMIN (GLUCOPHAGE) 500 MG tablet Take 500 mg by mouth 2 (two) times daily with a meal.      . cyclobenzaprine (FLEXERIL) 5 MG tablet Take 5 mg by mouth 3 (three) times daily as needed.      . predniSONE (DELTASONE) 20 MG tablet Take 20 mg by mouth daily.       No current facility-administered medications for this visit.     Allergies  Allergen Reactions  . Erythromycin   . Penicillins   .  Tetracycline      Family History  Problem Relation Age of Onset  . Hypertension Mother   . Diabetes Mother   . Cancer Father     lung ca  . Seizures Daughter     69yr     History   Social History  . Marital Status: Married    Spouse Name: N/A    Number of Children: N/A  . Years of Education: N/A   Social History Main Topics  . Smoking status: Never Smoker   . Smokeless tobacco: None  . Alcohol Use: No  . Drug Use: No  . Sexually Active: None   Other Topics Concern  . None   Social History Narrative  . None     REVIEW OF SYSTEMS - PERTINENT POSITIVES ONLY: 12 point review of systems negative other than HPI and PMH  EXAM: Filed Vitals:   11/19/12 0917  BP: 132/80  Pulse: 79  Temp: 97.9 F (36.6 C)  Resp: 18    Gen:  No acute distress.  Well nourished and well groomed.   Neurological: Alert and oriented to person, place, and time. Coordination normal.  Head: Normocephalic and atraumatic.  Eyes: Conjunctivae are normal. Pupils are equal, round, and reactive to light. No scleral icterus.  Neck: Normal range of motion. Neck supple. No tracheal deviation or thyromegaly present.  Cardiovascular: Normal rate, regular  rhythm, normal heart sounds and intact distal pulses.  Exam reveals no gallop and no friction rub.  No murmur heard. Respiratory: Effort normal.  No respiratory distress. No chest wall tenderness. Breath sounds normal.  No wheezes, rales or rhonchi.  GI: Soft. Bowel sounds are normal. The abdomen is soft.  There is diffuse tenderness, but more in RUQ.  There is no rebound and no guarding.  Musculoskeletal: Normal range of motion. Extremities are nontender.  Lymphadenopathy: No cervical, preauricular, postauricular or axillary adenopathy is present Skin: Skin is warm and dry. No rash noted. No diaphoresis. No erythema. No pallor. No clubbing, cyanosis, or edema.   Psychiatric: Normal mood and affect. Behavior is normal. Judgment and thought content  normal.    LABORATORY RESULTS: Available labs are reviewed  Alk phos 164, AST 112, ALT 128, T Bili 0.4, Cr 0.73  RADIOLOGY RESULTS: See E-Chart or I-Site for most recent results.  Images and reports are reviewed. RUQ ultrasound  Steatosis, porcelain gallbladder or gallbladder with with stones.     ASSESSMENT AND PLAN: Porcelain gallbladder Patient was thought to have possible porcelain gallbladder on her ultrasound. CT scan is recommended for evaluation. If she truly has a porcelain gallbladder, we will approach her surgery what differently. If her gallbladder has a calcified wall, I will send this for frozen section to make sure there is no malignancy. I am also going to get a CT scan and make sure there's no gallbladder mass present.  Chronic cholecystitis with calculus Patient appears to have chronic cholecystitis based on the chronic pain that she is having after she eats and tenderness.  I will plan to perform a laparoscopic cholecystectomy on her.  She is scheduled to get a colonoscopy for ulcerative colitis. I have asked this to be moved up a week. Since she has diarrhea, constipation or she doesn't have issues that need to be addressed prior to surgery. I would not want to operate on her in the middle of an active layer of ulcerative colitis. I also will make attempts to get a cholangiogram as she may have primary biliary cirrhosis or primary sclerosing cholangitis that is contributing to the elevated LFTs that she had previously.     Milus Height MD Surgical Oncology, General and [facility]      Visit Diagnoses: 1. Porcelain gallbladder   2. Chronic cholecystitis with calculus     Primary Care Physician: Aurora Mask, NP

## 2012-12-01 NOTE — Anesthesia Preprocedure Evaluation (Addendum)
Anesthesia Evaluation  Patient identified by MRN, date of birth, ID band Patient awake    Reviewed: Allergy & Precautions, H&P , NPO status , Patient's Chart, lab work & pertinent test results  Airway Mallampati: II TM Distance: >3 FB Neck ROM: Full    Dental  (+) Dental Advisory Given and Teeth Intact   Pulmonary asthma ,  breath sounds clear to auscultation        Cardiovascular negative cardio ROS  + Valvular Problems/Murmurs Rhythm:Regular Rate:Normal     Neuro/Psych negative neurological ROS  negative psych ROS   GI/Hepatic Neg liver ROS, PUD, GERD-  Medicated,  Endo/Other  diabetes, Type 2, Oral Hypoglycemic Agents  Renal/GU negative Renal ROS     Musculoskeletal negative musculoskeletal ROS (+)   Abdominal   Peds  Hematology negative hematology ROS (+)   Anesthesia Other Findings   Reproductive/Obstetrics negative OB ROS                         Anesthesia Physical Anesthesia Plan  ASA: III  Anesthesia Plan: General   Post-op Pain Management:    Induction: Intravenous  Airway Management Planned: Oral ETT  Additional Equipment:   Intra-op Plan:   Post-operative Plan: Extubation in OR  Informed Consent: I have reviewed the patients History and Physical, chart, labs and discussed the procedure including the risks, benefits and alternatives for the proposed anesthesia with the patient or authorized representative who has indicated his/her understanding and acceptance.   Dental advisory given  Plan Discussed with: CRNA  Anesthesia Plan Comments:         Anesthesia Quick Evaluation

## 2012-12-01 NOTE — Op Note (Signed)
Laparoscopic Cholecystectomy  Indications: This patient presents with chronic cholecystitis and cholelithiasis and will undergo laparoscopic cholecystectomy.  Pre-operative Diagnosis: chronic cholecystitis  Post-operative Diagnosis: Same  Surgeon: Stark Klein   Assistants: Nedra Hai  Anesthesia: General endotracheal anesthesia and local  Procedure Details  The patient was seen again in the Holding Room. The risks, benefits, complications, treatment options, and expected outcomes were discussed with the patient. The possibilities of  bleeding, recurrent infection, damage to nearby structures, the need for additional procedures, failure to diagnose a condition, the possible need to convert to an open procedure, and creating a complication requiring transfusion or operation were discussed with the patient. The likelihood of improving the patient's symptoms with return to their baseline status is good.    The patient and/or family concurred with the proposed plan, giving informed consent. The site of surgery properly noted. The patient was taken to Operating Room, and the procedure verified as Laparoscopic Cholecystectomy with possible Intraoperative Cholangiogram. A Time Out was held and the above information confirmed.  Prior to the induction of general anesthesia, antibiotic prophylaxis was administered. General endotracheal anesthesia was then administered and tolerated well. After the induction, the abdomen was prepped with Chloraprep and draped in the sterile fashion. The patient was positioned in the supine position.  Local anesthetic agent was injected into the skin near the umbilicus and an incision made. We dissected down to the abdominal fascia with blunt dissection.  The fascia was incised vertically and we entered the peritoneal cavity bluntly.  A pursestring suture of 0-Vicryl was placed around the fascial opening.  The Hasson cannula was inserted and secured with the stay  suture.  Pneumoperitoneum was then created with CO2 and tolerated well without any adverse changes in the patient's vital signs. An 11-mm port was placed in the subxiphoid position.  Two 5-mm ports were placed in the right upper quadrant. All skin incisions were infiltrated with a local anesthetic agent before making the incision and placing the trocars.   We positioned the patient in reverse Trendelenburg, tilted slightly to the patient's left.  The gallbladder was identified, the fundus grasped and retracted cephalad. Adhesions were lysed bluntly and with the electrocautery where indicated, taking care not to injure any adjacent organs or viscus. The infundibulum was grasped and retracted laterally, exposing the peritoneum overlying the triangle of Calot. This was then divided and exposed in a blunt fashion. A critical view of the cystic duct and cystic artery was obtained.  The cystic duct was clearly identified and bluntly dissected circumferentially. The cystic duct was ligated with a clip distally.     The cystic duct was then ligated with clips and divided. The cystic artery was identified, dissected free, ligated with clips and divided as well.   The gallbladder was dissected from the liver bed in retrograde fashion with the electrocautery. The gallbladder was removed and placed in an Endocatch bag.  The gallbladder and Endocatch bag were then removed through the umbilical port site.  The liver bed was irrigated and inspected. Hemostasis was achieved with the electrocautery. Copious irrigation was utilized and was repeatedly aspirated until clear.    We again inspected the right upper quadrant for hemostasis.  Pneumoperitoneum was released as we removed the trocars.   The pursestring suture was used to close the umbilical fascia.  4-0 Monocryl was used to close the skin.   The skin was cleaned and dry, and Dermabond was applied. The patient was then extubated and brought to the recovery room  in  stable condition. Instrument, sponge, and needle counts were correct at closure and at the conclusion of the case.   Findings: Cholelithiasis and chronic inflammation.    Estimated Blood Loss: min         Drains: none          Specimens: Gallbladder to pathology       Complications: None; patient tolerated the procedure well.         Disposition: PACU - hemodynamically stable.         Condition: stable

## 2012-12-01 NOTE — Anesthesia Postprocedure Evaluation (Signed)
Anesthesia Post Note  Patient: [name]  Procedure(s) Performed: Procedure(s) (LRB): LAPAROSCOPIC CHOLECYSTECTOMY (N/A)  Anesthesia type: General  Patient location: PACU  Post pain: Pain level controlled  Post assessment: Post-op Vital signs reviewed  Last Vitals: BP 137/78  Pulse 68  Temp(Src) 36.5 C (Oral)  Resp 12  SpO2 92%  Post vital signs: Reviewed  Level of consciousness: sedated  Complications: No apparent anesthesia complications

## 2012-12-01 NOTE — Transfer of Care (Signed)
Immediate Anesthesia Transfer of Care Note  Patient: [name]  Procedure(s) Performed: Procedure(s): LAPAROSCOPIC CHOLECYSTECTOMY (N/A)  Patient Location: PACU  Anesthesia Type:General  Level of Consciousness: awake, sedated and patient cooperative  Airway & Oxygen Therapy: Patient Spontanous Breathing and Patient connected to face mask oxygen  Post-op Assessment: Report given to PACU RN and Post -op Vital signs reviewed and stable  Post vital signs: Reviewed and stable  Complications: No apparent anesthesia complications

## 2012-12-02 ENCOUNTER — Telehealth (INDEPENDENT_AMBULATORY_CARE_PROVIDER_SITE_OTHER): Payer: Self-pay | Admitting: General Surgery

## 2012-12-02 ENCOUNTER — Encounter (HOSPITAL_COMMUNITY): Payer: Self-pay | Admitting: General Surgery

## 2012-12-02 NOTE — Telephone Encounter (Signed)
Spoke with patient she is aware of appt for po f/u on 12/24/12 at [facility]

## 2012-12-17 ENCOUNTER — Telehealth (INDEPENDENT_AMBULATORY_CARE_PROVIDER_SITE_OTHER): Payer: Self-pay

## 2012-12-17 NOTE — Telephone Encounter (Signed)
Pt notified nothing concerning on CT scan.  It does show gallstones which Dr. Barry Dienes will discuss with her at her appt on 6/13.

## 2012-12-21 ENCOUNTER — Encounter (INDEPENDENT_AMBULATORY_CARE_PROVIDER_SITE_OTHER): Payer: BC Managed Care – PPO | Admitting: General Surgery

## 2012-12-24 ENCOUNTER — Encounter (INDEPENDENT_AMBULATORY_CARE_PROVIDER_SITE_OTHER): Payer: BC Managed Care – PPO | Admitting: General Surgery

## 2013-01-06 ENCOUNTER — Encounter (INDEPENDENT_AMBULATORY_CARE_PROVIDER_SITE_OTHER): Payer: Self-pay | Admitting: General Surgery

## 2013-04-11 ENCOUNTER — Encounter (INDEPENDENT_AMBULATORY_CARE_PROVIDER_SITE_OTHER): Payer: Self-pay

## 2013-07-14 ENCOUNTER — Encounter (HOSPITAL_COMMUNITY): Payer: Self-pay | Admitting: Emergency Medicine

## 2013-07-14 ENCOUNTER — Emergency Department (HOSPITAL_COMMUNITY)
Admission: EM | Admit: 2013-07-14 | Discharge: 2013-07-14 | Disposition: A | Discharge status: Home / Self Care | Payer: BC Managed Care – PPO | Attending: Emergency Medicine | Admitting: Emergency Medicine

## 2013-07-14 DIAGNOSIS — Z9089 Acquired absence of other organs: Secondary | ICD-10-CM | POA: Insufficient documentation

## 2013-07-14 DIAGNOSIS — Z79899 Other long term (current) drug therapy: Secondary | ICD-10-CM | POA: Insufficient documentation

## 2013-07-14 DIAGNOSIS — J45909 Unspecified asthma, uncomplicated: Secondary | ICD-10-CM | POA: Insufficient documentation

## 2013-07-14 DIAGNOSIS — Z880 Allergy status to penicillin: Secondary | ICD-10-CM | POA: Insufficient documentation

## 2013-07-14 DIAGNOSIS — H60399 Other infective otitis externa, unspecified ear: Secondary | ICD-10-CM | POA: Insufficient documentation

## 2013-07-14 DIAGNOSIS — H6091 Unspecified otitis externa, right ear: Secondary | ICD-10-CM

## 2013-07-14 DIAGNOSIS — K219 Gastro-esophageal reflux disease without esophagitis: Secondary | ICD-10-CM | POA: Insufficient documentation

## 2013-07-14 DIAGNOSIS — E119 Type 2 diabetes mellitus without complications: Secondary | ICD-10-CM | POA: Insufficient documentation

## 2013-07-14 DIAGNOSIS — R011 Cardiac murmur, unspecified: Secondary | ICD-10-CM | POA: Insufficient documentation

## 2013-07-14 DIAGNOSIS — IMO0002 Reserved for concepts with insufficient information to code with codable children: Secondary | ICD-10-CM | POA: Insufficient documentation

## 2013-07-14 LAB — GLUCOSE, CAPILLARY: GLUCOSE-CAPILLARY: 117 mg/dL — AB (ref 70–99)

## 2013-07-14 MED ORDER — SULFAMETHOXAZOLE-TRIMETHOPRIM 800-160 MG PO TABS: 1.0000 | ORAL_TABLET | Freq: Two times a day (BID) | ORAL | Status: DC

## 2013-07-14 MED ORDER — CIPROFLOXACIN-HYDROCORTISONE 0.2-1 % OT SUSP: 3.0000 [drp] | Freq: Two times a day (BID) | OTIC | Status: DC

## 2013-07-14 MED ORDER — AMOXICILLIN 500 MG PO CAPS: 500.0000 mg | ORAL_CAPSULE | Freq: Three times a day (TID) | ORAL | Status: DC

## 2013-07-14 NOTE — ED Provider Notes (Signed)
CSN: [id number]     Arrival date & time 07/14/13  1041 History  This chart was scribed for Maudry Diego, MD by Jenne Campus, ED Scribe. This patient was seen in room APA03/APA03 and the patient's care was started at 11:08 AM.   Chief Complaint  Patient presents with  . Otalgia    Patient is a 58 y.o. female presenting with ear pain. The history is provided by the patient. No language interpreter was used.  Otalgia Location:  Right Behind ear:  No abnormality Quality: itching. Severity:  Mild Onset quality:  Gradual Duration:  2 days Timing:  Constant Progression:  Worsening Chronicity:  New Context: no water in ear   Associated symptoms: ear discharge   Associated symptoms: no abdominal pain, no congestion, no cough, no diarrhea, no headaches and no rash     HPI Comments: [name] is a 58 y.o. female who presents to the Emergency Department complaining of 2 days of mild right ear pain mostly in the canal with associated ear drainage described as yellow "crusties" in the morning and a moderate amount of itching. She denies any involvement with the left ear. She denies any recent illnesses.  She denies having any recurrent or recent ear infections.  Past Medical History  Diagnosis Date  . Asthma   . Seasonal allergies   . Diabetes mellitus without complication 0-16-01    hx. NIDDM-dx. 3 weeks ago  . Heart murmur 11-29-12  . GERD (gastroesophageal reflux disease)   . Ulcerative colitis    Past Surgical History  Procedure Laterality Date  . Cesarean section  1997/2006  . Colonoscopy w/ polypectomy    . Tonsillectomy    . Bunionectomy      bilateral and toe nails of big toes removed  . Cholecystectomy N/A 12/01/2012    Procedure: LAPAROSCOPIC CHOLECYSTECTOMY;  Surgeon: Stark Klein, MD;  Location: [facility];  Service: General;  Laterality: N/A;   Family History  Problem Relation Age of Onset  . Hypertension Mother   . Diabetes Mother   . Cancer Father     lung  ca  . Seizures Daughter     45yr   History  Substance Use Topics  . Smoking status: Never Smoker   . Smokeless tobacco: Not on file  . Alcohol Use: No   No OB history provided.  Review of Systems  Constitutional: Negative for appetite change and fatigue.  HENT: Positive for ear discharge and ear pain. Negative for congestion and sinus pressure.   Eyes: Negative for discharge.  Respiratory: Negative for cough.   Cardiovascular: Negative for chest pain.  Gastrointestinal: Negative for abdominal pain and diarrhea.  Genitourinary: Negative for frequency and hematuria.  Musculoskeletal: Negative for back pain.  Skin: Negative for rash.  Neurological: Negative for seizures and headaches.  Psychiatric/Behavioral: Negative for hallucinations.    Allergies  Erythromycin; Penicillins; and Tetracycline  Home Medications   Current Outpatient Rx  Name  Route  Sig  Dispense  Refill  . atorvastatin (LIPITOR) 20 MG tablet   Oral   Take 20 mg by mouth daily before breakfast.          . cyclobenzaprine (FLEXERIL) 5 MG tablet   Oral   Take 5 mg by mouth 3 (three) times daily as needed for muscle spasms.          .Marland Kitchenesomeprazole (NEXIUM) 40 MG capsule   Oral   Take 40 mg by mouth daily before breakfast.         .  fluticasone (FLOVENT HFA) 110 MCG/ACT inhaler   Inhalation   Inhale 1 puff into the lungs daily.         Marland Kitchen HYDROcodone-homatropine (HYCODAN) 5-1.5 MG/5ML syrup   Oral   Take 5 mLs by mouth every 4 (four) hours as needed. For cough         . metFORMIN (GLUCOPHAGE) 500 MG tablet   Oral   Take 500 mg by mouth 2 (two) times daily with a meal.         . oxyCODONE-acetaminophen (ROXICET) 5-325 MG per tablet   Oral   Take 1-2 tablets by mouth every 4 (four) hours as needed for pain.   30 tablet   0    Triage Vitals: BP 125/71  Pulse 77  Temp(Src) 98.2 F (36.8 C) (Oral)  Resp 18  Ht 5' 2"  (1.575 m)  Wt 140 lb (63.504 kg)  BMI 25.60 kg/m2  SpO2  100%  Physical Exam  Nursing note and vitals reviewed. Constitutional: She is oriented to person, place, and time. She appears well-developed and well-nourished.  HENT:  Head: Normocephalic and atraumatic.  Right ear canal is scaly appearing, erythematous and draining. Right TM appeared normal. Right external ear appears normal. Left ear and TM are normal.   Eyes: Conjunctivae are normal.  Neck: Normal range of motion. No tracheal deviation present.  Pulmonary/Chest: Effort normal. No respiratory distress.  Musculoskeletal: Normal range of motion.  Lymphadenopathy:    She has cervical adenopathy (tender nodes on the right neck).  Neurological: She is alert and oriented to person, place, and time.  Skin: Skin is warm and dry.  Psychiatric: She has a normal mood and affect. Her behavior is normal.    ED Course  Procedures (including critical care time)  DIAGNOSTIC STUDIES: Oxygen Saturation is 100% on RA, normal by my interpretation.    COORDINATION OF CARE: 11:03 AM-Advised pt that the canal appears irritated. Discussed discharge plan which includes ear drops and antibiotics with pt and pt agreed to plan. Also advised pt to follow up as needed and pt agreed. Addressed symptoms to return for with pt.   Labs Review Labs Reviewed - No data to display Imaging Review No results found.  EKG Interpretation   None       MDM  The chart was scribed for me under my direct supervision.  I personally performed the history, physical, and medical decision making and all procedures in the evaluation of this patient.Maudry Diego, MD 07/14/13 [phone]

## 2013-07-14 NOTE — ED Notes (Signed)
Pt c/o right ear pain x 2 days. Nad. Denies pain , just stopped up

## 2013-07-14 NOTE — Discharge Instructions (Signed)
Follow up with your md next week for recheck

## 2013-09-05 ENCOUNTER — Emergency Department (HOSPITAL_COMMUNITY)
Admission: EM | Admit: 2013-09-05 | Discharge: 2013-09-06 | Disposition: A | Discharge status: Home / Self Care | Payer: BC Managed Care – PPO | Attending: Emergency Medicine | Admitting: Emergency Medicine

## 2013-09-05 ENCOUNTER — Encounter (HOSPITAL_COMMUNITY): Payer: Self-pay | Admitting: Emergency Medicine

## 2013-09-05 ENCOUNTER — Emergency Department (HOSPITAL_COMMUNITY): Payer: BC Managed Care – PPO

## 2013-09-05 ENCOUNTER — Other Ambulatory Visit: Payer: Self-pay

## 2013-09-05 DIAGNOSIS — M5412 Radiculopathy, cervical region: Secondary | ICD-10-CM | POA: Insufficient documentation

## 2013-09-05 DIAGNOSIS — M5410 Radiculopathy, site unspecified: Secondary | ICD-10-CM

## 2013-09-05 DIAGNOSIS — R011 Cardiac murmur, unspecified: Secondary | ICD-10-CM | POA: Insufficient documentation

## 2013-09-05 DIAGNOSIS — E119 Type 2 diabetes mellitus without complications: Secondary | ICD-10-CM | POA: Insufficient documentation

## 2013-09-05 DIAGNOSIS — Z880 Allergy status to penicillin: Secondary | ICD-10-CM | POA: Insufficient documentation

## 2013-09-05 DIAGNOSIS — Z8719 Personal history of other diseases of the digestive system: Secondary | ICD-10-CM | POA: Insufficient documentation

## 2013-09-05 DIAGNOSIS — M79602 Pain in left arm: Secondary | ICD-10-CM

## 2013-09-05 DIAGNOSIS — IMO0002 Reserved for concepts with insufficient information to code with codable children: Secondary | ICD-10-CM | POA: Insufficient documentation

## 2013-09-05 DIAGNOSIS — Z79899 Other long term (current) drug therapy: Secondary | ICD-10-CM | POA: Insufficient documentation

## 2013-09-05 DIAGNOSIS — M542 Cervicalgia: Secondary | ICD-10-CM | POA: Insufficient documentation

## 2013-09-05 DIAGNOSIS — J45901 Unspecified asthma with (acute) exacerbation: Secondary | ICD-10-CM | POA: Insufficient documentation

## 2013-09-05 LAB — TROPONIN I: Troponin I: <0.3 ng/mL (ref <0.30)

## 2013-09-05 LAB — BASIC METABOLIC PANEL
BUN: 16 mg/dL (ref 6–23)
CO2: 30 mEq/L (ref 19–32)
Calcium: 9.1 mg/dL (ref 8.4–10.5)
Chloride: 101 mEq/L (ref 96–112)
Creatinine, Ser: 1.05 mg/dL (ref 0.50–1.10)
GFR, EST AFRICAN AMERICAN: 67 mL/min — AB (ref >90)
GFR, EST NON AFRICAN AMERICAN: 58 mL/min — AB (ref >90)
GLUCOSE: 135 mg/dL — AB (ref 70–99)
POTASSIUM: 3.7 meq/L (ref 3.7–5.3)
SODIUM: 140 meq/L (ref 137–147)

## 2013-09-05 LAB — CBC WITH DIFFERENTIAL/PLATELET
BASOS PCT: 0 % (ref 0–1)
Basophils Absolute: 0 10*3/uL (ref 0.0–0.1)
EOS PCT: 2 % (ref 0–5)
Eosinophils Absolute: 0.2 10*3/uL (ref 0.0–0.7)
HEMATOCRIT: 38.6 % (ref 36.0–46.0)
HEMOGLOBIN: 12.6 g/dL (ref 12.0–15.0)
LYMPHS PCT: 44 % (ref 12–46)
Lymphs Abs: 3.1 10*3/uL (ref 0.7–4.0)
MCH: 28.8 pg (ref 26.0–34.0)
MCHC: 32.6 g/dL (ref 30.0–36.0)
MCV: 88.1 fL (ref 78.0–100.0)
MONO ABS: 0.6 10*3/uL (ref 0.1–1.0)
MONOS PCT: 8 % (ref 3–12)
NEUTROS ABS: 3.3 10*3/uL (ref 1.7–7.7)
Neutrophils Relative %: 46 % (ref 43–77)
Platelets: 260 10*3/uL (ref 150–400)
RBC: 4.38 MIL/uL (ref 3.87–5.11)
RDW: 13.6 % (ref 11.5–15.5)
WBC: 7.2 10*3/uL (ref 4.0–10.5)

## 2013-09-05 MED ORDER — NAPROXEN 375 MG PO TABS: 375.0000 mg | ORAL_TABLET | Freq: Two times a day (BID) | ORAL | Status: DC

## 2013-09-05 MED ORDER — PREDNISONE 20 MG PO TABS: ORAL_TABLET | ORAL | Status: DC

## 2013-09-05 NOTE — ED Notes (Addendum)
Patient reports left arm pain and shortness of breath that started suddenly at 1900 tonight. Denies any injury to arm.

## 2013-09-05 NOTE — Discharge Instructions (Signed)
Take steroids as directed, they will elevate your sugar so monitor. If you were given medicines take as directed.  If you are on coumadin or contraceptives realize their levels and effectiveness is altered by many different medicines.  If you have any reaction (rash, tongues swelling, other) to the medicines stop taking and see a physician.   Please follow up as directed and return to the ER or see a physician for new or worsening symptoms (chest pain, arm weakness, swelling, fever,  other).  Cervical Radiculopathy Cervical radiculopathy means a nerve in the neck is pinched or bruised. This can cause pain or loss of feeling (numbness) that runs from your neck to your arm and fingers. HOME CARE   Put ice on the injured or painful area.  Put ice in a plastic bag.  Place a towel between your skin and the bag.  Leave the ice on for 15-20 minutes, 03-04 times a day, or as told by your doctor.  If ice does not help, you can try using heat. Take a warm shower or bath, or use a hot water bottle as told by your doctor.  You may try a gentle neck and shoulder massage.  Use a flat pillow when you sleep.  Only take medicines as told by your doctor.  Keep all physical therapy visits as told by your doctor.  If you are given a soft collar, wear it as told by your doctor. GET HELP RIGHT AWAY IF:   Your pain gets worse and is not controlled with medicine.  You lose feeling or feel weak in your hand, arm, face, or leg.  You have a fever or stiff neck.  You cannot control when you poop or pee (incontinence).  You have trouble with walking, balance, or speaking. MAKE SURE YOU:   Understand these instructions.  Will watch your condition.  Will get help right away if you are not doing well or get worse. Document Released: 06/19/2011 Document Revised: 09/22/2011 Document Reviewed: 06/19/2011 [facility] Patient Information 2014 [place], [place].  Thank you.

## 2013-09-05 NOTE — ED Provider Notes (Signed)
CSN: [id number]     Arrival date & time 09/05/13  1943 History  This chart was scribed for Shannon Clonts, MD by Elby Beck, ED Scribe. This patient was seen in room APA01/APA01 and the patient's care was started at 11:11 PM.   Chief Complaint  Patient presents with  . Arm Pain  . Shortness of Breath    The history is provided by the patient. No language interpreter was used.   HPI Comments: [name] is a 58 y.o. female who presents to the Emergency Department complaining of "sharp" left arm pain onset suddenly at 7:00 PM, about 4 hours ago. She reports associated "burning" neck pain onset earlier tonight as well. She denies any injury occurring to have onset her pain. She denies any prior history of similar pain. She denies any history of neck surgery. She states that she has a history of a heart murmur, but no other cardiac history. She denies chest pain, back pain, headache, vision changes, rash, sore throat, dyspnea or any other symptoms. She denies any history of blood clots.   Past Medical History  Diagnosis Date  . Asthma   . Seasonal allergies   . Diabetes mellitus without complication 0-62-37    hx. NIDDM-dx. 3 weeks ago  . Heart murmur 11-29-12  . GERD (gastroesophageal reflux disease)   . Ulcerative colitis    Past Surgical History  Procedure Laterality Date  . Cesarean section  1997/2006  . Colonoscopy w/ polypectomy    . Tonsillectomy    . Bunionectomy      bilateral and toe nails of big toes removed  . Cholecystectomy N/A 12/01/2012    Procedure: LAPAROSCOPIC CHOLECYSTECTOMY;  Surgeon: Stark Klein, MD;  Location: [facility];  Service: General;  Laterality: N/A;   Family History  Problem Relation Age of Onset  . Hypertension Mother   . Diabetes Mother   . Cancer Father     lung ca  . Seizures Daughter     63yr   History  Substance Use Topics  . Smoking status: Never Smoker   . Smokeless tobacco: Not on file  . Alcohol Use: No   OB History   Grav  Para Term Preterm Abortions TAB SAB Ect Mult Living                 Review of Systems  HENT: Negative for sore throat.   Eyes: Negative for visual disturbance.  Respiratory: Negative for shortness of breath.   Cardiovascular: Negative for chest pain.  Musculoskeletal: Positive for neck pain. Negative for back pain.       Left arm pain  Skin: Negative for rash.  Neurological: Negative for headaches.  All other systems reviewed and are negative.   Allergies  Erythromycin; Penicillins; and Tetracycline  Home Medications   Current Outpatient Rx  Name  Route  Sig  Dispense  Refill  . atorvastatin (LIPITOR) 20 MG tablet   Oral   Take 20 mg by mouth daily.          . fluticasone (FLOVENT HFA) 110 MCG/ACT inhaler   Inhalation   Inhale 1 puff into the lungs daily.         . metFORMIN (GLUCOPHAGE) 500 MG tablet   Oral   Take 500 mg by mouth 2 (two) times daily with a meal.          Triage Vitals: BP 136/80  Pulse 72  Temp(Src) 97.9 F (36.6 C) (Oral)  Resp 16  Ht 5'  2" (1.575 m)  Wt 140 lb (63.504 kg)  BMI 25.60 kg/m2  SpO2 99%  Physical Exam  Nursing note and vitals reviewed. Constitutional: She is oriented to person, place, and time. She appears well-developed and well-nourished. No distress.  HENT:  Head: Normocephalic and atraumatic.  Eyes: EOM are normal.  Neck: Neck supple. No tracheal deviation present.  Cardiovascular: Normal rate, regular rhythm and normal heart sounds.   Pulmonary/Chest: Effort normal and breath sounds normal. No respiratory distress. She has no wheezes. She has no rales. She exhibits no tenderness.  Lungs clear bilaterally.  Musculoskeletal: Normal range of motion.  Right arm pain is reproducible an palpation and movement.   Neurological: She is alert and oriented to person, place, and time. No cranial nerve deficit.  Strength and sensation in the major nerves of the upper extremities equal bilaterally. 1-2+ reflexes in biceps. Good  pulses.  Skin: Skin is warm and dry. No rash noted.  No swelling, no signs of rash, no induration.  Soft compartment left arm   Psychiatric: She has a normal mood and affect. Her behavior is normal.    ED Course  Procedures (including critical care time)  DIAGNOSTIC STUDIES: Oxygen Saturation is 99% on RA, normal by my interpretation.    COORDINATION OF CARE: 11:17 PM- Discussed plan to obtain a CXR and diagnostic lab work. Pt advised of plan for treatment and pt agrees.  Labs Review Labs Reviewed  BASIC METABOLIC PANEL - Abnormal; Notable for the following:    Glucose, Bld 135 (*)    GFR calc non Af Amer 58 (*)    GFR calc Af Amer 67 (*)    All other components within normal limits  CBC WITH DIFFERENTIAL  TROPONIN I  TROPONIN I   Imaging Review Dg Chest 2 View  09/05/2013   CLINICAL DATA:  Shortness of breath, asthma  EXAM: CHEST  2 VIEW  COMPARISON:  12/17/2011  FINDINGS: Cardiomediastinal silhouette is stable. No acute infiltrate or pleural effusion. No pulmonary edema. Bony thorax is stable.  IMPRESSION: No active cardiopulmonary disease.   Electronically Signed   By: [name] M.D.   On: 09/05/2013 20:34    EKG Interpretation    Date/Time:  Monday September 05 2013 23:33:11 EST Ventricular Rate:  64 PR Interval:  200 QRS Duration: 80 QT Interval:  394 QTC Calculation: 406 R Axis:   24 Text Interpretation:  Normal sinus rhythm Normal ECG When compared with ECG of 05-Sep-2013 19:42, Nonspecific T wave abnormality no longer evident in Anterior leads No acute findings Confirmed by Shannon Grega  MD, [name] ([id number]) on 09/05/2013 11:47:02 PM            MDM   Final diagnoses:  Left arm pain  Radiculopathy of arm   I personally performed the services described in this documentation, which was scribed in my presence. The recorded information has been reviewed and is accurate.  Clinically radiculopathy.  Screening cardiac work up unremarkable. Normal neuro  exam. Discussed steroids effect on glucose. Pain meds given prior to discharge.  Results and differential diagnosis were discussed with the patient. Close follow up outpatient was discussed, patient comfortable with the plan.      Shannon Clonts, MD 09/06/13 [phone]

## 2013-09-05 NOTE — ED Notes (Signed)
Patient also reports pain radiates into left side of neck.

## 2013-09-06 LAB — TROPONIN I

## 2013-09-06 MED ORDER — IBUPROFEN 800 MG PO TABS
ORAL_TABLET | ORAL | Status: AC
  Administered 2013-09-06: 01:00:00
  Filled 2013-09-06: qty 1

## 2013-09-06 MED ORDER — HYDROCODONE-ACETAMINOPHEN 5-325 MG PO TABS
ORAL_TABLET | ORAL | Status: AC
  Administered 2013-09-06: 01:00:00
  Filled 2013-09-06: qty 1

## 2013-10-25 ENCOUNTER — Emergency Department (HOSPITAL_COMMUNITY): Payer: BC Managed Care – PPO

## 2013-10-25 ENCOUNTER — Emergency Department (HOSPITAL_COMMUNITY)
Admission: EM | Admit: 2013-10-25 | Discharge: 2013-10-26 | Disposition: A | Discharge status: Home / Self Care | Payer: BC Managed Care – PPO | Attending: Emergency Medicine | Admitting: Emergency Medicine

## 2013-10-25 ENCOUNTER — Encounter (HOSPITAL_COMMUNITY): Payer: Self-pay | Admitting: Emergency Medicine

## 2013-10-25 DIAGNOSIS — R51 Headache: Secondary | ICD-10-CM | POA: Insufficient documentation

## 2013-10-25 DIAGNOSIS — Z880 Allergy status to penicillin: Secondary | ICD-10-CM | POA: Insufficient documentation

## 2013-10-25 DIAGNOSIS — IMO0001 Reserved for inherently not codable concepts without codable children: Secondary | ICD-10-CM | POA: Insufficient documentation

## 2013-10-25 DIAGNOSIS — J45901 Unspecified asthma with (acute) exacerbation: Secondary | ICD-10-CM | POA: Insufficient documentation

## 2013-10-25 DIAGNOSIS — E119 Type 2 diabetes mellitus without complications: Secondary | ICD-10-CM | POA: Insufficient documentation

## 2013-10-25 DIAGNOSIS — R509 Fever, unspecified: Secondary | ICD-10-CM | POA: Insufficient documentation

## 2013-10-25 DIAGNOSIS — R6889 Other general symptoms and signs: Secondary | ICD-10-CM

## 2013-10-25 DIAGNOSIS — J40 Bronchitis, not specified as acute or chronic: Secondary | ICD-10-CM

## 2013-10-25 DIAGNOSIS — R011 Cardiac murmur, unspecified: Secondary | ICD-10-CM | POA: Insufficient documentation

## 2013-10-25 DIAGNOSIS — IMO0002 Reserved for concepts with insufficient information to code with codable children: Secondary | ICD-10-CM | POA: Insufficient documentation

## 2013-10-25 DIAGNOSIS — Z8719 Personal history of other diseases of the digestive system: Secondary | ICD-10-CM | POA: Insufficient documentation

## 2013-10-25 MED ORDER — PREDNISONE 10 MG PO TABS
60.0000 mg | ORAL_TABLET | Freq: Once | ORAL | Status: AC
  Administered 2013-10-25: 60 mg via ORAL
  Filled 2013-10-25 (×2): qty 1

## 2013-10-25 MED ORDER — IPRATROPIUM-ALBUTEROL 0.5-2.5 (3) MG/3ML IN SOLN
3.0000 mL | Freq: Once | RESPIRATORY_TRACT | Status: AC
  Administered 2013-10-26: 3 mL via RESPIRATORY_TRACT
  Filled 2013-10-25: qty 3

## 2013-10-25 MED ORDER — IBUPROFEN 400 MG PO TABS
400.0000 mg | ORAL_TABLET | Freq: Once | ORAL | Status: AC
  Administered 2013-10-25: 400 mg via ORAL
  Filled 2013-10-25: qty 1

## 2013-10-25 NOTE — ED Notes (Signed)
Patient complaining of body aches, cough, and fever starting today.

## 2013-10-25 NOTE — ED Provider Notes (Signed)
CSN: [id number]     Arrival date & time 10/25/13  2129 History   First MD Initiated Contact with Patient 10/25/13 2256    This chart was scribed for Shannon Hacker, MD by Terressa Koyanagi, ED Scribe. This patient was seen in room APA10/APA10 and the patient's care was started at 11:30 PM.  Chief Complaint  Patient presents with  . Generalized Body Aches  . Fever  . Cough    HPI HPI Comments: [name] is a 58 y.o. female, with a history of asthma, DM, heart murmur and GERD who presents to the Emergency Department complaining of a fever (103 degrees) with associated cough, mild sore throat and body aches onset today. Pt reports she has been using her inhaler for her cough without relief. Pt denies abdominal pain, nausea, rashes, vomiting or neck pain.   Patient denies any nausea, vomiting, or diarrhea. Patient did not get a flu shot this year. Patient has not taken anything for her symptoms including Tylenol or ibuprofen.  Past Medical History  Diagnosis Date  . Asthma   . Seasonal allergies   . Diabetes mellitus without complication 03-17-99    hx. NIDDM-dx. 3 weeks ago  . Heart murmur 11-29-12  . GERD (gastroesophageal reflux disease)   . Ulcerative colitis    Past Surgical History  Procedure Laterality Date  . Cesarean section  1997/2006  . Colonoscopy w/ polypectomy    . Tonsillectomy    . Bunionectomy      bilateral and toe nails of big toes removed  . Cholecystectomy N/A 12/01/2012    Procedure: LAPAROSCOPIC CHOLECYSTECTOMY;  Surgeon: Stark Klein, MD;  Location: [facility];  Service: General;  Laterality: N/A;   Family History  Problem Relation Age of Onset  . Hypertension Mother   . Diabetes Mother   . Cancer Father     lung ca  . Seizures Daughter     7yr   History  Substance Use Topics  . Smoking status: Never Smoker   . Smokeless tobacco: Not on file  . Alcohol Use: No   OB History   Grav Para Term Preterm Abortions TAB SAB Ect Mult Living                  Review of Systems  Constitutional: Positive for fever.  HENT: Negative for sore throat.   Respiratory: Positive for cough and shortness of breath. Negative for chest tightness.   Cardiovascular: Negative for chest pain.  Gastrointestinal: Negative for nausea, vomiting, abdominal pain and diarrhea.  Genitourinary: Negative for dysuria.  Musculoskeletal: Positive for myalgias. Negative for back pain and neck pain.  Skin: Negative for rash.  Neurological: Positive for headaches.  Psychiatric/Behavioral: Negative for confusion.  All other systems reviewed and are negative.     Allergies  Erythromycin; Penicillins; and Tetracycline  Home Medications   Prior to Admission medications   Medication Sig Start Date End Date Taking? Authorizing Provider  fluticasone (FLOVENT HFA) 110 MCG/ACT inhaler Inhale 1-2 puffs into the lungs daily as needed (for shortness of breath/wheezing).    Yes Historical Provider, MD   Triage Vitals: BP 136/56  Pulse 97  Temp(Src) 100.1 F (37.8 C) (Oral)  Resp 20  Ht 5' 2"  (1.575 m)  Wt 130 lb (58.968 kg)  BMI 23.77 kg/m2  SpO2 100% Physical Exam  Nursing note and vitals reviewed. Constitutional: She is oriented to person, place, and time.  Ill-appearing, nontoxic, no acute distress  HENT:  Head: Normocephalic and atraumatic.  Mouth/Throat: Oropharynx is clear and moist.  Eyes: Pupils are equal, round, and reactive to light.  Neck: Neck supple. No JVD present.  No meningismus  Cardiovascular: Normal rate, regular rhythm and normal heart sounds.   No murmur heard. Pulmonary/Chest: Effort normal. No respiratory distress. She has wheezes.  Actively coughing  Abdominal: Soft. Bowel sounds are normal. There is no tenderness. There is no rebound.  Musculoskeletal: She exhibits no edema.  Lymphadenopathy:    She has no cervical adenopathy.  Neurological: She is alert and oriented to person, place, and time.  Skin: Skin is warm and dry.   Psychiatric: She has a normal mood and affect.    ED Course  Procedures (including critical care time) DIAGNOSTIC STUDIES: Oxygen Saturation is 100% on RA, normal by my interpretation.    COORDINATION OF CARE:  11:33 PM-Discussed treatment plan which includes imaging and labswith pt at bedside and pt agreed to plan.   Labs Review Labs Reviewed  CBC WITH DIFFERENTIAL - Abnormal; Notable for the following:    WBC 11.2 (*)    All other components within normal limits  BASIC METABOLIC PANEL - Abnormal; Notable for the following:    Potassium 3.5 (*)    Glucose, Bld 134 (*)    All other components within normal limits    Imaging Review Dg Chest 2 View  10/26/2013   CLINICAL DATA:  Generalized body a, the brain cough.  EXAM: CHEST  2 VIEW  COMPARISON:  DG CHEST 2 VIEW dated 09/05/2013  FINDINGS: The heart size and mediastinal contours are within normal limits. Both lungs are clear. The visualized skeletal structures are unremarkable. Surgical clips in the abdomen line may reflect cholecystectomy.  IMPRESSION: No acute cardiopulmonary process.   Electronically Signed   By: Elon Alas   On: 10/26/2013 00:05     EKG Interpretation None      MDM   Final diagnoses:  Flu-like symptoms  Bronchitis    Patient presents with flulike symptoms including fever, cough, and bodyaches. She is ill-appearing but nontoxic. She is coughing with wheezing on exam.  Patient was given prednisone and a duo neb.  She reports some improvement with treatment. Lab work is notable only for white count of 11.2 which is mildly elevated. Chest x-ray shows no evidence of pneumonia. Feel patient's symptoms are likely secondary to viral illness. I will treat for bronchitis given history of asthma with prednisone and albuterol. The patient was given strict return precautions.  I personally performed the services described in this documentation, which was scribed in my presence. The recorded information has  been reviewed and is accurate.    Shannon Hacker, MD 10/26/13 [phone]

## 2013-10-26 LAB — CBC WITH DIFFERENTIAL/PLATELET
BASOS ABS: 0 10*3/uL (ref 0.0–0.1)
Basophils Relative: 0 % (ref 0–1)
Eosinophils Absolute: 0.2 10*3/uL (ref 0.0–0.7)
Eosinophils Relative: 2 % (ref 0–5)
HEMATOCRIT: 36.5 % (ref 36.0–46.0)
HEMOGLOBIN: 12 g/dL (ref 12.0–15.0)
LYMPHS PCT: 28 % (ref 12–46)
Lymphs Abs: 3.2 10*3/uL (ref 0.7–4.0)
MCH: 28.6 pg (ref 26.0–34.0)
MCHC: 32.9 g/dL (ref 30.0–36.0)
MCV: 86.9 fL (ref 78.0–100.0)
MONO ABS: 0.8 10*3/uL (ref 0.1–1.0)
MONOS PCT: 7 % (ref 3–12)
NEUTROS ABS: 7 10*3/uL (ref 1.7–7.7)
Neutrophils Relative %: 63 % (ref 43–77)
Platelets: 214 10*3/uL (ref 150–400)
RBC: 4.2 MIL/uL (ref 3.87–5.11)
RDW: 13.4 % (ref 11.5–15.5)
WBC: 11.2 10*3/uL — AB (ref 4.0–10.5)

## 2013-10-26 LAB — BASIC METABOLIC PANEL
BUN: 8 mg/dL (ref 6–23)
CHLORIDE: 101 meq/L (ref 96–112)
CO2: 28 mEq/L (ref 19–32)
CREATININE: 0.74 mg/dL (ref 0.50–1.10)
Calcium: 9.2 mg/dL (ref 8.4–10.5)
GFR calc Af Amer: >90 mL/min (ref >90)
GFR calc non Af Amer: >90 mL/min (ref >90)
Glucose, Bld: 134 mg/dL — ABNORMAL HIGH (ref 70–99)
Potassium: 3.5 mEq/L — ABNORMAL LOW (ref 3.7–5.3)
Sodium: 141 mEq/L (ref 137–147)

## 2013-10-26 MED ORDER — PREDNISONE 20 MG PO TABS: 60.0000 mg | ORAL_TABLET | Freq: Once | ORAL | Status: DC

## 2013-10-26 MED ORDER — IBUPROFEN 400 MG PO TABS: 400.0000 mg | ORAL_TABLET | Freq: Four times a day (QID) | ORAL | Status: DC | PRN

## 2013-10-26 NOTE — Discharge Instructions (Signed)
Bronchitis Bronchitis is inflammation of the airways that extend from the windpipe into the lungs (bronchi). The inflammation often causes mucus to develop, which leads to a cough. If the inflammation becomes severe, it may cause shortness of breath. CAUSES  Bronchitis may be caused by:   Viral infections.   Bacteria.   Cigarette smoke.   Allergens, pollutants, and other irritants.  SIGNS AND SYMPTOMS  The most common symptom of bronchitis is a frequent cough that produces mucus. Other symptoms include:  Fever.   Body aches.   Chest congestion.   Chills.   Shortness of breath.   Sore throat.  DIAGNOSIS  Bronchitis is usually diagnosed through a medical history and physical exam. Tests, such as chest X-rays, are sometimes done to rule out other conditions.  TREATMENT  You may need to avoid contact with whatever caused the problem (smoking, for example). Medicines are sometimes needed. These may include:  Antibiotics. These may be prescribed if the condition is caused by bacteria.  Cough suppressants. These may be prescribed for relief of cough symptoms.   Inhaled medicines. These may be prescribed to help open your airways and make it easier for you to breathe.   Steroid medicines. These may be prescribed for those with recurrent (chronic) bronchitis. HOME CARE INSTRUCTIONS  Get plenty of rest.   Drink enough fluids to keep your urine clear or pale yellow (unless you have a medical condition that requires fluid restriction). Increasing fluids may help thin your secretions and will prevent dehydration.   Only take over-the-counter or prescription medicines as directed by your health care provider.  Only take antibiotics as directed. Make sure you finish them even if you start to feel better.  Avoid secondhand smoke, irritating chemicals, and strong fumes. These will make bronchitis worse. If you are a smoker, quit smoking. Consider using nicotine gum or  skin patches to help control withdrawal symptoms. Quitting smoking will help your lungs heal faster.   Put a cool-mist humidifier in your bedroom at night to moisten the air. This may help loosen mucus. Change the water in the humidifier daily. You can also run the hot water in your shower and sit in the bathroom with the door closed for 5 10 minutes.   Follow up with your health care provider as directed.   Wash your hands frequently to avoid catching bronchitis again or spreading an infection to others.  SEEK MEDICAL CARE IF: Your symptoms do not improve after 1 week of treatment.  SEEK IMMEDIATE MEDICAL CARE IF:  Your fever increases.  You have chills.   You have chest pain.   You have worsening shortness of breath.   You have bloody sputum.  You faint.  You have lightheadedness.  You have a severe headache.   You vomit repeatedly. MAKE SURE YOU:   Understand these instructions.  Will watch your condition.  Will get help right away if you are not doing well or get worse. Document Released: 06/30/2005 Document Revised: 04/20/2013 Document Reviewed: 02/22/2013 [facility] Patient Information 2014 [place]. Viral Infections A viral infection can be caused by different types of viruses.Most viral infections are not serious and resolve on their own. However, some infections may cause severe symptoms and may lead to further complications. SYMPTOMS Viruses can frequently cause:  Minor sore throat.  Aches and pains.  Headaches.  Runny nose.  Different types of rashes.  Watery eyes.  Tiredness.  Cough.  Loss of appetite.  Gastrointestinal infections, resulting in nausea, vomiting,  and diarrhea. These symptoms do not respond to antibiotics because the infection is not caused by bacteria. However, you might catch a bacterial infection following the viral infection. This is sometimes called a "superinfection." Symptoms of such a bacterial infection  may include:  Worsening sore throat with pus and difficulty swallowing.  Swollen neck glands.  Chills and a high or persistent fever.  Severe headache.  Tenderness over the sinuses.  Persistent overall ill feeling (malaise), muscle aches, and tiredness (fatigue).  Persistent cough.  Yellow, green, or brown mucus production with coughing. HOME CARE INSTRUCTIONS   Only take over-the-counter or prescription medicines for pain, discomfort, diarrhea, or fever as directed by your caregiver.  Drink enough water and fluids to keep your urine clear or pale yellow. Sports drinks can provide valuable electrolytes, sugars, and hydration.  Get plenty of rest and maintain proper nutrition. Soups and broths with crackers or rice are fine. SEEK IMMEDIATE MEDICAL CARE IF:   You have severe headaches, shortness of breath, chest pain, neck pain, or an unusual rash.  You have uncontrolled vomiting, diarrhea, or you are unable to keep down fluids.  You or your child has an oral temperature above 102 F (38.9 C), not controlled by medicine.  Your baby is older than 3 months with a rectal temperature of 102 F (38.9 C) or higher.  Your baby is 47 months old or younger with a rectal temperature of 100.4 F (38 C) or higher. MAKE SURE YOU:   Understand these instructions.  Will watch your condition.  Will get help right away if you are not doing well or get worse. Document Released: 04/09/2005 Document Revised: 09/22/2011 Document Reviewed: 11/04/2010 [facility] Patient Information 2014 [place], [place].

## 2013-10-26 NOTE — ED Notes (Signed)
Discharge instructions given and prescriptions given and reviewed with patient.  Patient verbalized understanding to take medications as directed and to follow up with PMD as needed.  Patient ambulatory; discharged home in good condition.

## 2014-06-18 ENCOUNTER — Emergency Department: Payer: Self-pay | Admitting: Emergency Medicine

## 2014-06-18 LAB — BASIC METABOLIC PANEL
Anion Gap: 11 (ref 7–16)
BUN: 11 mg/dL (ref 7–18)
CHLORIDE: 106 mmol/L (ref 98–107)
CREATININE: 0.84 mg/dL (ref 0.60–1.30)
Calcium, Total: 8.4 mg/dL — ABNORMAL LOW (ref 8.5–10.1)
Co2: 25 mmol/L (ref 21–32)
EGFR (Non-African Amer.): >60
Glucose: 178 mg/dL — ABNORMAL HIGH (ref 65–99)
Osmolality: 287 (ref 275–301)
Potassium: 3.8 mmol/L (ref 3.5–5.1)
Sodium: 142 mmol/L (ref 136–145)

## 2014-06-18 LAB — CBC
HCT: 36.6 % (ref 35.0–47.0)
HGB: 12.2 g/dL (ref 12.0–16.0)
MCH: 29.5 pg (ref 26.0–34.0)
MCHC: 33.3 g/dL (ref 32.0–36.0)
MCV: 89 fL (ref 80–100)
Platelet: 256 10*3/uL (ref 150–440)
RBC: 4.13 10*6/uL (ref 3.80–5.20)
RDW: 13.8 % (ref 11.5–14.5)
WBC: 6.1 10*3/uL (ref 3.6–11.0)

## 2014-06-18 LAB — TROPONIN I

## 2014-06-22 ENCOUNTER — Observation Stay: Payer: Self-pay | Admitting: Internal Medicine

## 2014-06-22 LAB — CBC
HCT: 37.9 % (ref 35.0–47.0)
HGB: 12.3 g/dL (ref 12.0–16.0)
MCH: 29 pg (ref 26.0–34.0)
MCHC: 32.5 g/dL (ref 32.0–36.0)
MCV: 89 fL (ref 80–100)
Platelet: 257 10*3/uL (ref 150–440)
RBC: 4.25 10*6/uL (ref 3.80–5.20)
RDW: 13.3 % (ref 11.5–14.5)
WBC: 8.6 10*3/uL (ref 3.6–11.0)

## 2014-06-22 LAB — BASIC METABOLIC PANEL
ANION GAP: 6 — AB (ref 7–16)
BUN: 14 mg/dL (ref 7–18)
CO2: 31 mmol/L (ref 21–32)
CREATININE: 0.95 mg/dL (ref 0.60–1.30)
Calcium, Total: 8 mg/dL — ABNORMAL LOW (ref 8.5–10.1)
Chloride: 104 mmol/L (ref 98–107)
Glucose: 200 mg/dL — ABNORMAL HIGH (ref 65–99)
Osmolality: 287 (ref 275–301)
Potassium: 3.2 mmol/L — ABNORMAL LOW (ref 3.5–5.1)
Sodium: 141 mmol/L (ref 136–145)

## 2014-06-22 LAB — LIPID PANEL
Cholesterol: 211 mg/dL — ABNORMAL HIGH (ref 0–200)
HDL Cholesterol: 42 mg/dL (ref 40–60)
TRIGLYCERIDES: 473 mg/dL — AB (ref 0–200)

## 2014-06-22 LAB — CK TOTAL AND CKMB (NOT AT ARMC)
CK, TOTAL: 43 U/L (ref 26–192)
CK, Total: 56 U/L (ref 26–192)
CK-MB: <0.5 ng/mL — ABNORMAL LOW (ref 0.5–3.6)

## 2014-06-22 LAB — TROPONIN I: Troponin-I: <0.02 ng/mL

## 2014-06-22 LAB — HEPATIC FUNCTION PANEL A (ARMC)
ALBUMIN: 3.1 g/dL — AB (ref 3.4–5.0)
AST: 41 U/L — AB (ref 15–37)
Alkaline Phosphatase: 123 U/L — ABNORMAL HIGH
Bilirubin,Total: 0.4 mg/dL (ref 0.2–1.0)
SGPT (ALT): 45 U/L
TOTAL PROTEIN: 6.5 g/dL (ref 6.4–8.2)

## 2014-06-22 LAB — HEMOGLOBIN A1C: Hemoglobin A1C: 7.5 % — ABNORMAL HIGH (ref 4.2–6.3)

## 2014-06-22 LAB — D-DIMER(ARMC): D-DIMER: 425 ng/mL

## 2014-06-23 DIAGNOSIS — I200 Unstable angina: Secondary | ICD-10-CM

## 2014-06-23 LAB — CBC WITH DIFFERENTIAL/PLATELET
Basophil #: 0 10*3/uL (ref 0.0–0.1)
Basophil %: 0.3 %
EOS ABS: 0 10*3/uL (ref 0.0–0.7)
Eosinophil %: 0 %
HCT: 37.2 % (ref 35.0–47.0)
HGB: 12.2 g/dL (ref 12.0–16.0)
Lymphocyte #: 1.8 10*3/uL (ref 1.0–3.6)
Lymphocyte %: 19.1 %
MCH: 29.2 pg (ref 26.0–34.0)
MCHC: 32.6 g/dL (ref 32.0–36.0)
MCV: 89 fL (ref 80–100)
Monocyte #: 0.4 x10 3/mm (ref 0.2–0.9)
Monocyte %: 4.7 %
NEUTROS ABS: 7 10*3/uL — AB (ref 1.4–6.5)
Neutrophil %: 75.9 %
PLATELETS: 251 10*3/uL (ref 150–440)
RBC: 4.17 10*6/uL (ref 3.80–5.20)
RDW: 13.6 % (ref 11.5–14.5)
WBC: 9.3 10*3/uL (ref 3.6–11.0)

## 2014-06-23 LAB — BASIC METABOLIC PANEL
Anion Gap: 7 (ref 7–16)
BUN: 17 mg/dL (ref 7–18)
CO2: 27 mmol/L (ref 21–32)
Calcium, Total: 8.7 mg/dL (ref 8.5–10.1)
Chloride: 104 mmol/L (ref 98–107)
Creatinine: 0.86 mg/dL (ref 0.60–1.30)
EGFR (African American): >60
EGFR (Non-African Amer.): >60
GLUCOSE: 246 mg/dL — AB (ref 65–99)
Osmolality: 285 (ref 275–301)
POTASSIUM: 4.2 mmol/L (ref 3.5–5.1)
SODIUM: 138 mmol/L (ref 136–145)

## 2014-06-23 LAB — CK TOTAL AND CKMB (NOT AT ARMC): CK, Total: 66 U/L (ref 26–192)

## 2014-06-23 LAB — TROPONIN I: Troponin-I: <0.02 ng/mL

## 2014-07-03 ENCOUNTER — Encounter: Payer: Self-pay | Admitting: Cardiovascular Disease

## 2014-07-04 ENCOUNTER — Encounter: Payer: Self-pay | Admitting: *Deleted

## 2014-07-13 ENCOUNTER — Ambulatory Visit: Payer: BC Managed Care – PPO | Admitting: Cardiovascular Disease

## 2014-07-31 ENCOUNTER — Ambulatory Visit (INDEPENDENT_AMBULATORY_CARE_PROVIDER_SITE_OTHER): Payer: BLUE CROSS/BLUE SHIELD | Admitting: Cardiovascular Disease

## 2014-07-31 ENCOUNTER — Encounter: Payer: Self-pay | Admitting: Cardiovascular Disease

## 2014-07-31 VITALS — BP 116/88 | HR 80 | Ht 62.0 in | Wt 151.2 lb

## 2014-07-31 DIAGNOSIS — E1165 Type 2 diabetes mellitus with hyperglycemia: Secondary | ICD-10-CM | POA: Insufficient documentation

## 2014-07-31 DIAGNOSIS — Z7689 Persons encountering health services in other specified circumstances: Secondary | ICD-10-CM

## 2014-07-31 DIAGNOSIS — E118 Type 2 diabetes mellitus with unspecified complications: Secondary | ICD-10-CM

## 2014-07-31 DIAGNOSIS — Z7189 Other specified counseling: Secondary | ICD-10-CM

## 2014-07-31 DIAGNOSIS — R079 Chest pain, unspecified: Secondary | ICD-10-CM

## 2014-07-31 DIAGNOSIS — E119 Type 2 diabetes mellitus without complications: Secondary | ICD-10-CM | POA: Insufficient documentation

## 2014-07-31 DIAGNOSIS — IMO0002 Reserved for concepts with insufficient information to code with codable children: Secondary | ICD-10-CM | POA: Insufficient documentation

## 2014-07-31 DIAGNOSIS — R0789 Other chest pain: Secondary | ICD-10-CM

## 2014-07-31 MED ORDER — GLYBURIDE 5 MG PO TABS: 5.0000 mg | ORAL_TABLET | Freq: Every day | ORAL | Status: DC

## 2014-07-31 NOTE — Progress Notes (Signed)
HPI  This is a 59 year old female who is here today for a follow-up visit after hospitalization in December at [facility] for chest pain. She presented with substernal chest tightness and heaviness associated with shortness of breath. This lasted for a few hours. She was hospitalized at [facility] where she ruled out for myocardial infarction. She underwent a treadmill nuclear stress test. She had exercise-induced chest pain with borderline EKG changes. However, there was no evidence of perfusion defects with normal ejection fraction. She does have diabetes but no hypertension or hyperlipidemia. She reports no further episodes and several hospital discharge last month. She feels back to her baseline.  Allergies  Allergen Reactions  . Erythromycin Nausea And Vomiting  . Penicillins Nausea And Vomiting  . Tetracycline Nausea And Vomiting     Current Outpatient Prescriptions on File Prior to Visit  Medication Sig Dispense Refill  . aspirin 81 MG tablet Take 81 mg by mouth daily.    . fluticasone (FLOVENT HFA) 110 MCG/ACT inhaler Inhale 1-2 puffs into the lungs daily as needed (for shortness of breath/wheezing).     Marland Kitchen ibuprofen (ADVIL,MOTRIN) 400 MG tablet Take 1 tablet (400 mg total) by mouth every 6 (six) hours as needed. 30 tablet 0   No current facility-administered medications on file prior to visit.     Past Medical History  Diagnosis Date  . Asthma   . Seasonal allergies   . Diabetes mellitus without complication 1-63-84    hx. NIDDM-dx. 3 weeks ago  . Heart murmur 11-29-12  . GERD (gastroesophageal reflux disease)   . Ulcerative colitis   . Hyperlipidemia     hx     Past Surgical History  Procedure Laterality Date  . Cesarean section  1997/2006  . Colonoscopy w/ polypectomy    . Tonsillectomy    . Bunionectomy      bilateral and toe nails of big toes removed  . Cholecystectomy N/A 12/01/2012    Procedure: LAPAROSCOPIC CHOLECYSTECTOMY;  Surgeon: Stark Klein, MD;  Location: [facility]  [facility];  Service: General;  Laterality: N/A;     Family History  Problem Relation Age of Onset  . Hypertension Mother   . Diabetes Mother   . Cancer Father     lung ca  . Seizures Daughter     82yr     History   Social History  . Marital Status: Married    Spouse Name: N/A    Number of Children: N/A  . Years of Education: N/A   Occupational History  . Not on file.   Social History Main Topics  . Smoking status: Never Smoker   . Smokeless tobacco: Not on file  . Alcohol Use: Yes     Comment: occasional  . Drug Use: No  . Sexual Activity: Not Currently   Other Topics Concern  . Not on file   Social History Narrative      PHYSICAL EXAM   BP 116/88 mmHg  Pulse 80  Ht 5' 2"  (1.575 m)  Wt 151 lb 4 oz (68.607 kg)  BMI 27.66 kg/m2 Constitutional: She is oriented to person, place, and time. She appears well-developed and well-nourished. No distress.  HENT: No nasal discharge.  Head: Normocephalic and atraumatic.  Eyes: Pupils are equal and round. No discharge.  Neck: Normal range of motion. Neck supple. No JVD present. No thyromegaly present.  Cardiovascular: Normal rate, regular rhythm, normal heart sounds. Exam reveals no gallop and no friction rub. No murmur heard.  Pulmonary/Chest: Effort normal  and breath sounds normal. No stridor. No respiratory distress. She has no wheezes. She has no rales. She exhibits no tenderness.  Abdominal: Soft. Bowel sounds are normal. She exhibits no distension. There is no tenderness. There is no rebound and no guarding.  Musculoskeletal: Normal range of motion. She exhibits no edema and no tenderness.  Neurological: She is alert and oriented to person, place, and time. Coordination normal.  Skin: Skin is warm and dry. No rash noted. She is not diaphoretic. No erythema. No pallor.  Psychiatric: She has a normal mood and affect. Her behavior is normal. Judgment and thought content normal.     RVI:FBPPH  Rhythm  Low voltage in  precordial leads.   -  Nonspecific T-abnormality.   ABNORMAL    ASSESSMENT AND PLAN

## 2014-07-31 NOTE — Assessment & Plan Note (Signed)
I refilled her glyburide and refilled her to primary care as he does not have a primary care physician.

## 2014-07-31 NOTE — Patient Instructions (Signed)
Continue same medications.   Refer to  [facility] primary care at [facility]  Follow up as needed.

## 2014-07-31 NOTE — Assessment & Plan Note (Signed)
She reports no further episodes of chest pain since last month. Nuclear stress test was negative for ischemia with normal ejection fraction. Thus, I recommend no further cardiac evaluation. She can follow-up as needed. I discussed with her the importance of controlling risk factors.

## 2014-08-02 ENCOUNTER — Encounter: Payer: Self-pay | Admitting: Internal Medicine

## 2014-08-02 ENCOUNTER — Ambulatory Visit (INDEPENDENT_AMBULATORY_CARE_PROVIDER_SITE_OTHER): Payer: BLUE CROSS/BLUE SHIELD | Admitting: Internal Medicine

## 2014-08-02 ENCOUNTER — Encounter (INDEPENDENT_AMBULATORY_CARE_PROVIDER_SITE_OTHER): Payer: Self-pay

## 2014-08-02 VITALS — BP 124/82 | HR 77 | Temp 98.0°F | Ht 62.0 in | Wt 150.5 lb

## 2014-08-02 DIAGNOSIS — E785 Hyperlipidemia, unspecified: Secondary | ICD-10-CM | POA: Insufficient documentation

## 2014-08-02 DIAGNOSIS — J4520 Mild intermittent asthma, uncomplicated: Secondary | ICD-10-CM

## 2014-08-02 DIAGNOSIS — K8010 Calculus of gallbladder with chronic cholecystitis without obstruction: Secondary | ICD-10-CM

## 2014-08-02 DIAGNOSIS — E1169 Type 2 diabetes mellitus with other specified complication: Secondary | ICD-10-CM | POA: Insufficient documentation

## 2014-08-02 DIAGNOSIS — E119 Type 2 diabetes mellitus without complications: Secondary | ICD-10-CM

## 2014-08-02 NOTE — Patient Instructions (Signed)
Diabetes and Exercise Exercising regularly is important. It is not just about losing weight. It has many health benefits, such as:  Improving your overall fitness, flexibility, and endurance.  Increasing your bone density.  Helping with weight control.  Decreasing your body fat.  Increasing your muscle strength.  Reducing stress and tension.  Improving your overall health. People with diabetes who exercise gain additional benefits because exercise:  Reduces appetite.  Improves the body's use of blood sugar (glucose).  Helps lower or control blood glucose.  Decreases blood pressure.  Helps control blood lipids (such as cholesterol and triglycerides).  Improves the body's use of the hormone insulin by:  Increasing the body's insulin sensitivity.  Reducing the body's insulin needs.  Decreases the risk for heart disease because exercising:  Lowers cholesterol and triglycerides levels.  Increases the levels of good cholesterol (such as high-density lipoproteins [HDL]) in the body.  Lowers blood glucose levels. YOUR ACTIVITY PLAN  Choose an activity that you enjoy and set realistic goals. Your health care provider or diabetes educator can help you make an activity plan that works for you. Exercise regularly as directed by your health care provider. This includes:  Performing resistance training twice a week such as push-ups, sit-ups, lifting weights, or using resistance bands.  Performing 150 minutes of cardio exercises each week such as walking, running, or playing sports.  Staying active and spending no more than 90 minutes at one time being inactive. Even short bursts of exercise are good for you. Three 10-minute sessions spread throughout the day are just as beneficial as a single 30-minute session. Some exercise ideas include:  Taking the dog for a walk.  Taking the stairs instead of the elevator.  Dancing to your favorite song.  Doing an exercise  video.  Doing your favorite exercise with a friend. RECOMMENDATIONS FOR EXERCISING WITH TYPE 1 OR TYPE 2 DIABETES   Check your blood glucose before exercising. If blood glucose levels are greater than 240 mg/dL, check for urine ketones. Do not exercise if ketones are present.  Avoid injecting insulin into areas of the body that are going to be exercised. For example, avoid injecting insulin into:  The arms when playing tennis.  The legs when jogging.  Keep a record of:  Food intake before and after you exercise.  Expected peak times of insulin action.  Blood glucose levels before and after you exercise.  The type and amount of exercise you have done.  Review your records with your health care provider. Your health care provider will help you to develop guidelines for adjusting food intake and insulin amounts before and after exercising.  If you take insulin or oral hypoglycemic agents, watch for signs and symptoms of hypoglycemia. They include:  Dizziness.  Shaking.  Sweating.  Chills.  Confusion.  Drink plenty of water while you exercise to prevent dehydration or heat stroke. Body water is lost during exercise and must be replaced.  Talk to your health care provider before starting an exercise program to make sure it is safe for you. Remember, almost any type of activity is better than none. Document Released: 09/20/2003 Document Revised: 11/14/2013 Document Reviewed: 12/07/2012 [employer] Patient Information 2015 [employer]. This information is not intended to replace advice given to you by your health care provider. Make sure you discuss any questions you have with your health care provider.  

## 2014-08-02 NOTE — Progress Notes (Signed)
HPI  Pt presents to the clinic today to establish care. She has not had a PCP in a few years. She has been getting care at [facility].  Flu: yearly, wants one today Tetanus: > 10 years ago Pneumovax: 2009 LMP: post menopausal Pap Smear: 2006 Mammogram: more than 2 years ago Colon Screening: 2014 Vision Screening: > 1 year ago Dentist: as needed   Allergy induced asthma: Takes Flovent as needed. Worse when the seasons change.  Diabetes: Diagnosed 1 year ago. She is taking Diabeta. Her fasting sugars range 124- 225. She is not consuming a low carb diet. She is not exercise. Her last eye exam was 1 year ago. Her pneumonia shot is UTD. She will be getting a flu shot today. She reports her last A1C was 2 weeks ago but cannot remember the value.  Ulcerative Colitis: Diagnosed when she was 14. Last colonoscopy was in 2014. She does not follow with GI. She reports that she has not had a flare since 2007.  HLD: Not taking any medication for this. She was on medication in the past but cannot remember the name.  Past Medical History  Diagnosis Date  . Asthma   . Seasonal allergies   . Diabetes mellitus without complication 2-64-15    hx. NIDDM-dx. 3 weeks ago  . Heart murmur 11-29-12  . GERD (gastroesophageal reflux disease)   . Ulcerative colitis   . Hyperlipidemia     hx    Current Outpatient Prescriptions  Medication Sig Dispense Refill  . aspirin 81 MG tablet Take 81 mg by mouth daily.    . fluticasone (FLOVENT HFA) 110 MCG/ACT inhaler Inhale 1-2 puffs into the lungs daily as needed (for shortness of breath/wheezing).     Marland Kitchen glyBURIDE (DIABETA) 5 MG tablet Take 1 tablet (5 mg total) by mouth daily with breakfast. 30 tablet 0  . ibuprofen (ADVIL,MOTRIN) 400 MG tablet Take 1 tablet (400 mg total) by mouth every 6 (six) hours as needed. 30 tablet 0   No current facility-administered medications for this visit.    Allergies  Allergen Reactions  . Erythromycin Nausea  And Vomiting  . Penicillins Nausea And Vomiting  . Tetracycline Nausea And Vomiting    Family History  Problem Relation Age of Onset  . Hypertension Mother   . Diabetes Mother   . Cancer Father     lung ca  . Seizures Daughter     83yr    History   Social History  . Marital Status: Married    Spouse Name: N/A    Number of Children: N/A  . Years of Education: N/A   Occupational History  . Not on file.   Social History Main Topics  . Smoking status: Never Smoker   . Smokeless tobacco: Not on file  . Alcohol Use: 0.0 oz/week    0 Not specified per week     Comment: occasional  . Drug Use: No  . Sexual Activity: Not Currently   Other Topics Concern  . Not on file   Social History Narrative    ROS:  Constitutional: Denies fever, malaise, fatigue, headache or abrupt weight changes.  HEENT: Denies eye pain, eye redness, ear pain, ringing in the ears, wax buildup, runny nose, nasal congestion, bloody nose, or sore throat. Respiratory: Denies difficulty breathing, shortness of breath, cough or sputum production.   Cardiovascular: Denies chest pain, chest tightness, palpitations or swelling in the hands or feet.  Skin: Denies redness, rashes, lesions or  ulcercations.  Neurological: Denies dizziness, difficulty with memory, difficulty with speech or problems with balance and coordination.  Psych: Pt denies anxiety, depression, SI/HI.  No other specific complaints in a complete review of systems (except as listed in HPI above).  PE:  Ht 5' 2"  (1.575 m)  Wt 150 lb 8 oz (68.266 kg)  BMI 27.52 kg/m2 Wt Readings from Last 3 Encounters:  08/02/14 150 lb 8 oz (68.266 kg)  07/31/14 151 lb 4 oz (68.607 kg)  10/25/13 130 lb (58.968 kg)    General: Appears her stated age, well developed, well nourished in NAD. Cardiovascular: Normal rate and rhythm. S1,S2 noted.  No murmur, rubs or gallops noted. No JVD or BLE edema. No carotid bruits noted. Pulmonary/Chest: Normal effort  and positive vesicular breath sounds. No respiratory distress. No wheezes, rales or ronchi noted.  Abdomen: Soft and nontender. Normal bowel sounds, no bruits noted. No distention or masses noted. Liver, spleen and kidneys non palpable. Neurological: Alert and oriented.  Psychiatric: Mood and affect normal. Behavior is normal. Judgment and thought content normal.     BMET    Component Value Date/Time   NA 141 10/26/2013 0014   K 3.5* 10/26/2013 0014   CL 101 10/26/2013 0014   CO2 28 10/26/2013 0014   GLUCOSE 134* 10/26/2013 0014   BUN 8 10/26/2013 0014   CREATININE 0.74 10/26/2013 0014   CALCIUM 9.2 10/26/2013 0014   GFRNONAA >90 10/26/2013 0014   GFRAA >90 10/26/2013 0014    Lipid Panel     Component Value Date/Time   CHOL  03/12/2009 0143    196        ATP III CLASSIFICATION:  <200     mg/dL   Desirable  200-239  mg/dL   Borderline High  >=240    mg/dL   High          TRIG 207* 03/12/2009 0143   HDL 44 03/12/2009 0143   CHOLHDL 4.5 03/12/2009 0143   VLDL 41* 03/12/2009 0143   LDLCALC * 03/12/2009 0143    111        Total Cholesterol/HDL:CHD Risk Coronary Heart Disease Risk Table                     Men   Women  1/2 Average Risk   3.4   3.3  Average Risk       5.0   4.4  2 X Average Risk   9.6   7.1  3 X Average Risk  23.4   11.0        Use the calculated Patient Ratio above and the CHD Risk Table to determine the patient's CHD Risk.        ATP III CLASSIFICATION (LDL):  <100     mg/dL   Optimal  100-129  mg/dL   Near or Above                    Optimal  130-159  mg/dL   Borderline  160-189  mg/dL   High  >190     mg/dL   Very High    CBC    Component Value Date/Time   WBC 11.2* 10/26/2013 0014   RBC 4.20 10/26/2013 0014   HGB 12.0 10/26/2013 0014   HCT 36.5 10/26/2013 0014   PLT 214 10/26/2013 0014   MCV 86.9 10/26/2013 0014   MCH 28.6 10/26/2013 0014   MCHC 32.9 10/26/2013 0014   RDW 13.4 10/26/2013  0014   LYMPHSABS 3.2 10/26/2013 0014    MONOABS 0.8 10/26/2013 0014   EOSABS 0.2 10/26/2013 0014   BASOSABS 0.0 10/26/2013 0014    Hgb A1C Lab Results  Component Value Date   HGBA1C * 03/12/2009    6.5 (NOTE) The ADA recommends the following therapeutic goal for glycemic control related to Hgb A1c measurement: Goal of therapy: <6.5 Hgb A1c  Reference: American Diabetes Association: Clinical Practice Recommendations 2010, Diabetes Care, 2010, 33: (Suppl  1).     Assessment and Plan:

## 2014-08-02 NOTE — Assessment & Plan Note (Signed)
Diet controlled Will repeat lipid profile and CMET in 3 months.

## 2014-08-02 NOTE — Assessment & Plan Note (Signed)
No recent flare Not medicated Not following with GI

## 2014-08-02 NOTE — Assessment & Plan Note (Signed)
Continue flovent prn

## 2014-08-02 NOTE — Assessment & Plan Note (Signed)
Will review records to see A1C results from 2 weeks ago Has had hyperglycemia On Glyburide Metformin caused severe diarrhea Flu and pneumonia shot updated today Foot exam today Encouraged her to work on diet and exercise  RTC in 3 months for physical exam, will check A1C and microalbumin at that time

## 2014-08-02 NOTE — Progress Notes (Signed)
Pre visit review using our clinic review tool, if applicable. No additional management support is needed unless otherwise documented below in the visit note. 

## 2014-08-08 ENCOUNTER — Other Ambulatory Visit: Payer: Self-pay

## 2014-08-08 MED ORDER — GLYBURIDE 5 MG PO TABS: 5.0000 mg | ORAL_TABLET | Freq: Two times a day (BID) | ORAL | Status: DC

## 2014-08-08 MED ORDER — SIMVASTATIN 10 MG PO TABS: 10.0000 mg | ORAL_TABLET | Freq: Every day | ORAL | Status: DC

## 2014-08-08 NOTE — Telephone Encounter (Signed)
Pt is aware as instructed--pt has a 3 mth f/u 11/07/14 Rx sent through [facility]  Per Webb Silversmith--  Call pt:   A1C reviewed from records 7.5%. I want to increase her Diabeta to 5 mg BID. Please send in new RX # 60, 2 refills  Also cholesterol is very elevated. She needs to be started on cholesterol medication. If she is okay with this, send in Zocor 10 mg daily # 30, 2 refills.  I want to see her back in 3 months.

## 2014-08-22 ENCOUNTER — Ambulatory Visit: Payer: Self-pay | Admitting: Internal Medicine

## 2014-08-29 ENCOUNTER — Other Ambulatory Visit: Payer: Self-pay

## 2014-08-29 ENCOUNTER — Encounter: Payer: Self-pay | Admitting: Internal Medicine

## 2014-08-29 ENCOUNTER — Ambulatory Visit (INDEPENDENT_AMBULATORY_CARE_PROVIDER_SITE_OTHER): Payer: BLUE CROSS/BLUE SHIELD | Admitting: Internal Medicine

## 2014-08-29 VITALS — BP 120/74 | HR 98 | Temp 97.9°F | Wt 155.0 lb

## 2014-08-29 DIAGNOSIS — M7522 Bicipital tendinitis, left shoulder: Secondary | ICD-10-CM

## 2014-08-29 DIAGNOSIS — M25512 Pain in left shoulder: Secondary | ICD-10-CM

## 2014-08-29 DIAGNOSIS — M7552 Bursitis of left shoulder: Secondary | ICD-10-CM

## 2014-08-29 MED ORDER — GLYBURIDE 5 MG PO TABS: 5.0000 mg | ORAL_TABLET | Freq: Two times a day (BID) | ORAL | Status: DC

## 2014-08-29 MED ORDER — PREDNISONE 10 MG PO TABS: ORAL_TABLET | ORAL | Status: DC

## 2014-08-29 NOTE — Patient Instructions (Signed)
Biceps Tendon Tendinitis (Distal) with Rehab Tendinitis involves inflammation and pain over the affected tendon. The distal biceps tendon (near the elbow) is vulnerable to tendinitis. Distal biceps tendonitis is usually due to the bony bump near the elbow (bicipital tuberosity) causing increased friction over the tendon. The biceps tendon attaches the biceps muscle to one bone in the elbow and two in the shoulder. It is important for proper function of the elbow and for turning the palm upward (supination). SYMPTOMS   Pain, aching, tenderness, and sometimes warmth or redness over the front of the elbow.  Pain when bending the elbow or turning the palm up, using the wrist, especially if performed against resistance.  Crackling sound (crepitation) when the tendon or elbow is moved or touched. CAUSES  The symptoms of biceps tendonitis are due to inflammation of the tendon. Inflammation may be caused by:  Strain from sudden increase in amount or intensity of activity.  Direct blow or injury to the elbow (uncommon).  Overuse or repetitive elbow bending or wrist rotation, particularly when turning the palm up, or with elbow hyperextension. RISK INCREASES WITH:  Sports that involve contact or overhead arm activity (throwing sports, gymnastics, weightlifting, bodybuilding, rock climbing).  Heavy labor.  Poor strength and flexibility.  Failure to warm up properly before activity.  Injury to other structures of the elbow.  Restraint of the elbow. PREVENTION  Warm up and stretch properly before activity.  Allow time for recovery between activities.  Maintain physical fitness:  Strength, flexibility, and endurance.  Cardiovascular fitness.  Learn and use proper exercise technique. PROGNOSIS  With proper treatment, biceps tendon tendonitis is usually curable within 6 weeks.  RELATED COMPLICATIONS   Longer healing time if not properly treated or if not given enough time to  heal.  Chronically inflamed tendon that causes persistent pain with activity, that may progress to constant pain and potentially rupture of the tendon.  Recurring symptoms, especially if activity is resumed too soon, with overuse or with poor technique. TREATMENT  Treatment first involves ice and medicine to reduce pain and inflammation. Modify activities that cause pain, to reduce the chances of causing the condition to get worse. Strengthening and stretching exercises should be performed to promote proper use of the muscles of the elbow. These exercise may be performed at home or with a therapist. Other treatments may be given such as ultrasound or heat therapy. Surgery is usually not recommended.  MEDICATION  If pain medicine is needed, nonsteroidal anti-inflammatory medicines (aspirin and ibuprofen), or other minor pain relievers (acetaminophen), are often advised.  Do not take pain relieving medication for 7 days before surgery.  Prescription pain relievers may be given if your caregiver thinks they are needed. Use only as directed and only as much as you need. HEAT AND COLD:   Cold treatment (icing) should be applied for 10 to 15 minutes every 2 to 3 hours for inflammation and pain, and immediately after activity that aggravates your symptoms. Use ice packs or an ice massage.  Heat treatment may be used before performing stretching and strengthening activities prescribed by your caregiver, physical therapist, or athletic trainer. Use a heat pack or a warm water soak. SEEK MEDICAL CARE IF:   Symptoms get worse or do not improve in 2 weeks, despite treatment.  New, unexplained symptoms develop. (Drugs used in treatment may produce side effects.) EXERCISES  RANGE OF MOTION (ROM) AND STRETCHING EXERCISES - Biceps Tendon Tendinitis (Distal) These exercises may help you when beginning to  rehabilitate your injury. Your symptoms may go away with or without further involvement from your  physician, physical therapist, or athletic trainer. While completing these exercises, remember:   Restoring tissue flexibility helps normal motion to return to the joints. This allows healthier, less painful movement and activity.  An effective stretch should be held for at least 30 seconds.  A stretch should never be painful. You should only feel a gentle lengthening or release in the stretched tissue. STRETCH - Elbow Flexors   Lie on a firm bed or countertop on your back. Be sure that you are in a comfortable position which will allow you to relax your arm muscles.  Place a folded towel under your right / left upper arm, so that your elbow and shoulder are at the same height. Extend your arm; your elbow should not rest on the bed or towel.  Allow the weight of your hand to straighten your elbow. Keep your arm and chest muscles relaxed. Your caretaker may ask you to increase the intensity of your stretch by adding a small wrist or hand weight.  Hold for __________ seconds. You should feel a stretch on the inside of your elbow. Slowly return to the starting position. Repeat __________ times. Complete this exercise __________ times per day. RANGE OF MOTION - Supination, Active   Stand or sit with your elbows at your side. Bend your right / left elbow to 90 degrees.  Turn your palm upward until you feel a gentle stretch on the inside of your forearm.  Hold this position for __________ seconds. Slowly release and return to the starting position. Repeat __________ times. Complete this stretch __________ times per day.  RANGE OF MOTION - Pronation, Active   Stand or sit with your elbows at your side. Bend your right / left elbow to 90 degrees.  Turn your palm downward until you feel a gentle stretch on the top of your forearm.  Hold this position for __________ seconds. Slowly release and return to the starting position. Repeat __________ times. Complete this stretch __________ times per  day.  STRENGTHENING EXERCISES - Biceps Tendon Tendinitis (Distal) These exercises may help you when beginning to rehabilitate your injury. They may resolve your symptoms with or without further involvement from your physician, physical therapist or athletic trainer. While completing these exercises, remember:   Muscles can gain both the endurance and the strength needed for everyday activities through controlled exercises.  Complete these exercises as instructed by your physician, physical therapist or athletic trainer. Increase the resistance and repetitions only as guided.  You may experience muscle soreness or fatigue, but the pain or discomfort you are trying to eliminate should never get worse during these exercises. If this pain does get worse, stop and make sure you are following the directions exactly. If the pain is still present after adjustments, discontinue the exercise until you can discuss the trouble with your clinician. STRENGTH - Elbow Flexors, Isometric   Stand or sit upright on a firm surface. Place your right / left arm so that your hand is palm-up and at the height of your waist.  Place your opposite hand on top of your forearm. Gently push down as your right / left arm resists. Push as hard as you can with both arms without causing any pain or movement at your right / left elbow. Hold this stationary position for __________ seconds.  Gradually release the tension in both arms. Allow your muscles to relax completely before repeating. Repeat  __________ times. Complete this exercise __________ times per day. STRENGTH - Forearm Supinators   Sit with your right / left forearm supported on a table, keeping your elbow below shoulder height. Rest your hand over the edge, palm down.  Gently grip a hammer or a soup ladle.  Without moving your elbow, slowly turn your palm and hand upward to a "thumbs-up" position.  Hold this position for __________ seconds. Slowly return to the  starting position. Repeat __________ times. Complete this exercise __________ times per day.  STRENGTH - Forearm Pronators   Sit with your right / left forearm supported on a table, keeping your elbow below shoulder height. Rest your hand over the edge, palm up.  Gently grip a hammer or a soup ladle.  Without moving your elbow, slowly turn your palm and hand upward to a "thumbs-up" position.  Hold this position for __________ seconds. Slowly return to the starting position. Repeat __________ times. Complete this exercise __________ times per day.  STRENGTH - Elbow Flexors, Supinated  With good posture, stand or sit on a firm chair without armrests. Allow your right / left arm to rest at your side with your palm facing forward.  Holding a __________ weight, or gripping a rubber exercise band or tubing, bring your hand toward your shoulder.  Allow your muscles to control the resistance as your hand returns to your side. Repeat __________ times. Complete this exercise __________ times per day.  STRENGTH - Elbow Flexors, Neutral  With good posture, stand or sit on a firm chair without armrests. Allow your right / left arm to rest at your side with your thumb facing forward.  Holding a __________ weight, or gripping a rubber exercise band or tubing, bring your hand toward your shoulder.  Allow your muscles to control the resistance as your hand returns to your side. Repeat __________ times. Complete this exercise __________ times per day.  Document Released: 06/30/2005 Document Revised: 09/22/2011 Document Reviewed: 10/12/2008 [facility] Patient Information 2015 [place], [place]. This information is not intended to replace advice given to you by your health care provider. Make sure you discuss any questions you have with your health care provider.

## 2014-08-29 NOTE — Telephone Encounter (Signed)
Pt said her insurance requires her to get her maintenance meds thru [facility] mail order; pt has appt in 10/2014, advised will refill # 90 today and get refills updated in 10/2014. Pt voiced understanding.

## 2014-08-29 NOTE — Progress Notes (Signed)
Pre visit review using our clinic review tool, if applicable. No additional management support is needed unless otherwise documented below in the visit note. 
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
## 2014-09-12 ENCOUNTER — Other Ambulatory Visit: Payer: Self-pay | Admitting: Internal Medicine

## 2014-09-12 ENCOUNTER — Telehealth: Payer: Self-pay

## 2014-09-12 DIAGNOSIS — K51919 Ulcerative colitis, unspecified with unspecified complications: Secondary | ICD-10-CM

## 2014-09-12 NOTE — Telephone Encounter (Signed)
Referral placed.

## 2014-09-12 NOTE — Telephone Encounter (Signed)
Pt made appt to see dr copland for 09/18/2014--pt states she has been having problems with ulcerative colitis--she does not remember previous GI provider and wants to be referred to one

## 2014-09-12 NOTE — Telephone Encounter (Signed)
Pt left v/m; pt was seen 08/29/14; pt has finished the prednisone and last week pts shoulder started hurting again. Pt wants to know if med can be sent to [facility] [address] or does pt need to get MRI or should pt be rechecked.Please advise.

## 2014-09-12 NOTE — Telephone Encounter (Signed)
She should follow up, probably with Dr. Lorelei Pont. We should get shoulder xray before MRI

## 2014-09-15 ENCOUNTER — Telehealth: Payer: Self-pay

## 2014-09-15 NOTE — Telephone Encounter (Signed)
Faxed a request for ED notes to [facility]--pt did not sign a release--just sent fax cover letter with request to see if they will release--awaiting response--pt may have to sign a med rec release

## 2014-09-18 ENCOUNTER — Ambulatory Visit (INDEPENDENT_AMBULATORY_CARE_PROVIDER_SITE_OTHER): Payer: BLUE CROSS/BLUE SHIELD | Admitting: Family Medicine

## 2014-09-18 ENCOUNTER — Encounter: Payer: Self-pay | Admitting: Family Medicine

## 2014-09-18 VITALS — BP 106/70 | HR 87 | Temp 98.2°F | Ht 62.0 in | Wt 154.8 lb

## 2014-09-18 DIAGNOSIS — M25512 Pain in left shoulder: Secondary | ICD-10-CM
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
## 2014-09-18 NOTE — Progress Notes (Signed)
Pre visit review using our clinic review tool, if applicable. No additional management support is needed unless otherwise documented below in the visit note. 

## 2014-09-18 NOTE — Patient Instructions (Signed)

## 2014-09-20 ENCOUNTER — Ambulatory Visit
Admission: RE | Admit: 2014-09-20 | Discharge: 2014-09-20 | Disposition: A | Discharge status: Home / Self Care | Payer: BLUE CROSS/BLUE SHIELD | Source: Ambulatory Visit | Attending: Family Medicine | Admitting: Family Medicine

## 2014-09-20 DIAGNOSIS — M25512 Pain in left shoulder: Secondary | ICD-10-CM

## 2014-09-21 ENCOUNTER — Encounter: Payer: Self-pay | Admitting: Internal Medicine

## 2014-09-22 ENCOUNTER — Telehealth: Payer: Self-pay

## 2014-09-22 ENCOUNTER — Telehealth: Payer: Self-pay | Admitting: Internal Medicine

## 2014-09-22 NOTE — Telephone Encounter (Signed)
Pt request cb at [phone] about MRI results.

## 2014-09-22 NOTE — Telephone Encounter (Signed)
Pt is going to call back with 2014 Colonoscopy information when she finds out who did the procedure. She does not remember who did it but knows it was in [place]. I informed her [facility] needs those records before her appointment with Dr. Carlean Purl in April. Pt verbalized understanding and will call me back.

## 2014-09-22 NOTE — Telephone Encounter (Signed)
She saw Dr. Lorelei Pont for this. Will defer to him to advise her of MRI

## 2014-09-25 NOTE — Telephone Encounter (Signed)
Per prior note to this #

## 2014-09-25 NOTE — Telephone Encounter (Signed)
MRI results given to patient via telephone.  See Result Note.  Follow up appointment scheduled with Dr. Lorelei Pont 09/28/2014 at 11:00 am to discuss MRI results further and come up with a treatment plan.

## 2014-09-25 NOTE — Telephone Encounter (Signed)
Request 2014 Colon from Dr. Benson Norway and [facility] with fax cover sheet only. May need pt to come in and sign if that does not suffice.

## 2014-09-26 NOTE — Telephone Encounter (Signed)
FYI: Pt coming in on 09/28/14 to see Dr. Lorelei Pont. Please get me upon arrival as I do need to have her sign a ROI.  Thanks

## 2014-09-28 ENCOUNTER — Ambulatory Visit (INDEPENDENT_AMBULATORY_CARE_PROVIDER_SITE_OTHER): Payer: BLUE CROSS/BLUE SHIELD | Admitting: Family Medicine

## 2014-09-28 ENCOUNTER — Ambulatory Visit: Payer: Self-pay | Admitting: Family Medicine

## 2014-09-28 ENCOUNTER — Encounter: Payer: Self-pay | Admitting: Family Medicine

## 2014-09-28 VITALS — BP 120/80 | HR 84 | Temp 98.4°F | Ht 62.0 in | Wt 154.4 lb

## 2014-09-28 DIAGNOSIS — M7552 Bursitis of left shoulder: Secondary | ICD-10-CM

## 2014-09-28 DIAGNOSIS — M7582 Other shoulder lesions, left shoulder: Secondary | ICD-10-CM

## 2014-09-28 NOTE — Telephone Encounter (Signed)
ROI signed and faxed 09/28/14

## 2014-09-28 NOTE — Progress Notes (Signed)
Pre visit review using our clinic review tool, if applicable. No additional management support is needed unless otherwise documented below in the visit note. 

## 2014-09-28 NOTE — Patient Instructions (Signed)
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
## 2014-11-04 NOTE — Discharge Summary (Signed)
Dates of Admission and Diagnosis:  Date of Admission 22-Jun-2014   Date of Discharge 23-Jun-2014   Admitting Diagnosis chest pain   Final Diagnosis chest pain non cardiac diabetes a1 c 7.5   Discharge Diagnosis 1 diabetes A1c 7.5    Chief Complaint/History of Present Illness CHIEF COMPLAINT: Chest pain.   HISTORY OF PRESENT ILLNESS: [name] is a 59 year old African American female with past medical history significant for diet-controlled diabetes mellitus and asthma, now presents to the hospital secondary to chest pressure that started this afternoon. The patient denies any known cardiac history other than a benign cardiac murmur. Does not smoke. Family history of coronary artery disease in mom with chronic angina but no intervention done.  She states that she was doing fine up until this afternoon when she all of sudden, while walking, experienced heavy chest pressure associated with nausea and difficulty breathing.  It lasted until the patient presented to the Emergency Room and had aspirin.  She had never had these symptoms before, no dyspnea or pain on exertion in the past.  She was recently seen about 4 days ago for asthma exacerbation and was started on prednisone taper, cough syrup.  She states, her cough is better and this is definitely not pain related to chronic coughing.  She is chest pain free at this time and is being admitted under observation for chest pain.   Allergies:  Penicillin: GI Distress  Erythromycin: GI Distress  Tetracycline: GI Distress  Cardiology:  11-Dec-15 09:57   Protocol BRUCE  Max Work Load 47  Total Exercise Time 235  Max Diastolic BP 71  Max Heart Rate 142  Max Predicted Heart Rate 163  Reason For Termination Target Heart Rate Achieved  ECG interpretation Confirmed by OVERREAD, NOT ([id number]), editor BARTLES, KAREN ([id number]) on 06/23/2014 2:31:21 PM Also confirmed by OVERREAD, NOT ([id number]), editor BARTLES, KAREN (3)  on 06/23/2014 2:32:02 PM  Routine  Chem:  11-Dec-15 05:03   Glucose, Serum  246  BUN 17  Creatinine (comp) 0.86  Sodium, Serum 138  Potassium, Serum 4.2  Chloride, Serum 104  CO2, Serum 27  Calcium (Total), Serum 8.7  Anion Gap 7  Osmolality (calc) 285  eGFR (African American) >60  eGFR (Non-African American) >60 (eGFR values <26m/min/1.73 m2 may be an indication of chronic kidney disease (CKD). Calculated eGFR, using the MRDR Study equation, is useful in  patients with stable renal function. The eGFR calculation will not be reliable in acutely ill patients when serum creatinine is changing rapidly. It is not useful in patients on dialysis. The eGFR calculation may not be applicable to patients at the low and high extremes of body sizes, pregnant women, and vegetarians.)  Result Comment POTASSIUM/CREATININE/BUN - Slight hemolysis, interpret results with  - caution.  Result(s) reported on 23 Jun 2014 at 05:33AM.  Routine Hem:  11-Dec-15 05:03   WBC (CBC) 9.3  RBC (CBC) 4.17  Hemoglobin (CBC) 12.2  Hematocrit (CBC) 37.2  Platelet Count (CBC) 251  MCV 89  MCH 29.2  MCHC 32.6  RDW 13.6  Neutrophil % 75.9  Lymphocyte % 19.1  Monocyte % 4.7  Eosinophil % 0.0  Basophil % 0.3  Neutrophil #  7.0  Lymphocyte # 1.8  Monocyte # 0.4  Eosinophil # 0.0  Basophil # 0.0 (Result(s) reported on 23 Jun 2014 at 05:33AM.)   PERTINENT RADIOLOGY STUDIES: LabUnknown:    11-Dec-15 14:23, NM MYOCARDIAL SCAN  PACS Image    Pertinent Past History:  Pertinent Past History  1.  Asthma.  2.  Diet-controlled diabetes mellitus.  3.  Benign heart murmur.   Hospital Course:  Hospital Course 1. Chest pain: patient was admitted for evaluation of her chest pain. Her tronins times threee were negative as well as telemetry monitoring. She therefore underwent a treadmill stress test which essentiall ywas low rosk for ischmeia. She had no chest pain during her hospital stay. IF this recoccurs she was asked to come back to ED for  revaluation, but at this time given low risk scan and normal cardiac markers her chest pain was not thoguth to be cardiac in nature.  2. Diabetes without complications; A1c was 7.5. I discussed starting medications with her. She did not want to start on metformin due to side effect of diarrhea which she tried in the past. She preferred starting Glyburide. I also made an outpatient referral for Diabetes education and out dietitian saw her hwhile she was in the hospital.  VITALS AT DISCHARGE  97/8 P 70 BP 143/89 98%RA GEN: alert oriented NAD CVS 2/6 SEM r,r,r, ABD: BS+ no rebound/guarding/NT/ND LUNGS: CTAB no crackles, rales wheezing EXT no c/c/e NEURO CN 2-12 intact   Condition on Discharge Stable   Code Status:  Code Status Full Code   DISCHARGE INSTRUCTIONS HOME MEDS:  Medication Reconciliation: Patient's Home Medications at Discharge:     Medication Instructions  aspirin 81 mg oral tablet, chewable  1 tab(s) orally once a day   diabeta 5 mg oral tablet  1 tab(s) orally once a day    STOP TAKING THE FOLLOWING MEDICATION(S):    prednisone 10 mg tablet: 6 tab(s) orally once a day x 1 daystab(s) orally once a day x 1 daystab(s) orally once a day x 1 daystab(s) orally once a day x 1 daystab(s) orally once a day x 1 daystab(s) orally once a day x 1 days tussionex pennkinetic 8 mg-10 mg/5 ml oral suspension, extended release: 5 milliliter(s) orally every 12 hours, As Needed for cough.   Physician's Instructions:  Home Health? No   Treatments None   Home Oxygen? No   Diet Carbohydrate Controlled (ADA) Diet   Activity Limitations As tolerated   Referrals diabetes   Return to Work Not Applicable   Time frame for Follow Up Appointment 1-2 weeks  Dr Fletcher Anon   TIME SPENT:  Total Time: Greater than 30 minutes   Electronic Signatures: Bettey Costa (MD)  (Signed 11-Dec-15 21:26)  Authored: ADMISSION DATE AND DIAGNOSIS, CHIEF COMPLAINT/HPI, Allergies, PERTINENT LABS, PERTINENT  RADIOLOGY STUDIES, PERTINENT PAST HISTORY, HOSPITAL COURSE, DISCHARGE INSTRUCTIONS HOME MEDS, PATIENT INSTRUCTIONS, TIME SPENT   Last Updated: 11-Dec-15 21:26 by Bettey Costa (MD)

## 2014-11-07 ENCOUNTER — Encounter: Payer: Self-pay | Admitting: Internal Medicine

## 2014-11-07 ENCOUNTER — Ambulatory Visit (INDEPENDENT_AMBULATORY_CARE_PROVIDER_SITE_OTHER): Payer: BLUE CROSS/BLUE SHIELD | Admitting: Internal Medicine

## 2014-11-07 VITALS — BP 110/72 | HR 85 | Temp 98.2°F | Wt 155.0 lb

## 2014-11-07 DIAGNOSIS — E119 Type 2 diabetes mellitus without complications: Secondary | ICD-10-CM

## 2014-11-07 DIAGNOSIS — E785 Hyperlipidemia, unspecified: Secondary | ICD-10-CM | POA: Diagnosis not present

## 2014-11-07 DIAGNOSIS — Z23 Encounter for immunization: Secondary | ICD-10-CM | POA: Diagnosis not present

## 2014-11-07 NOTE — Patient Instructions (Signed)
Fat and Cholesterol Control Diet Fat and cholesterol levels in your blood and organs are influenced by your diet. High levels of fat and cholesterol may lead to diseases of the heart, small and large blood vessels, gallbladder, liver, and pancreas. CONTROLLING FAT AND CHOLESTEROL WITH DIET Although exercise and lifestyle factors are important, your diet is key. That is because certain foods are known to raise cholesterol and others to lower it. The goal is to balance foods for their effect on cholesterol and more importantly, to replace saturated and trans fat with other types of fat, such as monounsaturated fat, polyunsaturated fat, and omega-3 fatty acids. On average, a person should consume no more than 15 to 17 g of saturated fat daily. Saturated and trans fats are considered "bad" fats, and they will raise LDL cholesterol. Saturated fats are primarily found in animal products such as meats, butter, and cream. However, that does not mean you need to give up all your favorite foods. Today, there are good tasting, low-fat, low-cholesterol substitutes for most of the things you like to eat. Choose low-fat or nonfat alternatives. Choose round or loin cuts of red meat. These types of cuts are lowest in fat and cholesterol. Chicken (without the skin), fish, veal, and ground turkey breast are great choices. Eliminate fatty meats, such as hot dogs and salami. Even shellfish have little or no saturated fat. Have a 3 oz (85 g) portion when you eat lean meat, poultry, or fish. Trans fats are also called "partially hydrogenated oils." They are oils that have been scientifically manipulated so that they are solid at room temperature resulting in a longer shelf life and improved taste and texture of foods in which they are added. Trans fats are found in stick margarine, some tub margarines, cookies, crackers, and baked goods.  When baking and cooking, oils are a great substitute for butter. The monounsaturated oils are  especially beneficial since it is believed they lower LDL and raise HDL. The oils you should avoid entirely are saturated tropical oils, such as coconut and palm.  Remember to eat a lot from food groups that are naturally free of saturated and trans fat, including fish, fruit, vegetables, beans, grains (barley, rice, couscous, bulgur wheat), and pasta (without cream sauces).  IDENTIFYING FOODS THAT LOWER FAT AND CHOLESTEROL  Soluble fiber may lower your cholesterol. This type of fiber is found in fruits such as apples, vegetables such as broccoli, potatoes, and carrots, legumes such as beans, peas, and lentils, and grains such as barley. Foods fortified with plant sterols (phytosterol) may also lower cholesterol. You should eat at least 2 g per day of these foods for a cholesterol lowering effect.  Read package labels to identify low-saturated fats, trans fat free, and low-fat foods at the supermarket. Select cheeses that have only 2 to 3 g saturated fat per ounce. Use a heart-healthy tub margarine that is free of trans fats or partially hydrogenated oil. When buying baked goods (cookies, crackers), avoid partially hydrogenated oils. Breads and muffins should be made from whole grains (whole-wheat or whole oat flour, instead of "flour" or "enriched flour"). Buy non-creamy canned soups with reduced salt and no added fats.  FOOD PREPARATION TECHNIQUES  Never deep-fry. If you must fry, either stir-fry, which uses very little fat, or use non-stick cooking sprays. When possible, broil, bake, or roast meats, and steam vegetables. Instead of putting butter or margarine on vegetables, use lemon and herbs, applesauce, and cinnamon (for squash and sweet potatoes). Use nonfat   yogurt, salsa, and low-fat dressings for salads.  LOW-SATURATED FAT / LOW-FAT FOOD SUBSTITUTES Meats / Saturated Fat (g)  Avoid: Steak, marbled (3 oz/85 g) / 11 g  Choose: Steak, lean (3 oz/85 g) / 4 g  Avoid: Hamburger (3 oz/85 g) / 7  g  Choose: Hamburger, lean (3 oz/85 g) / 5 g  Avoid: Ham (3 oz/85 g) / 6 g  Choose: Ham, lean cut (3 oz/85 g) / 2.4 g  Avoid: Chicken, with skin, dark meat (3 oz/85 g) / 4 g  Choose: Chicken, skin removed, dark meat (3 oz/85 g) / 2 g  Avoid: Chicken, with skin, light meat (3 oz/85 g) / 2.5 g  Choose: Chicken, skin removed, light meat (3 oz/85 g) / 1 g Dairy / Saturated Fat (g)  Avoid: Whole milk (1 cup) / 5 g  Choose: Low-fat milk, 2% (1 cup) / 3 g  Choose: Low-fat milk, 1% (1 cup) / 1.5 g  Choose: Skim milk (1 cup) / 0.3 g  Avoid: Hard cheese (1 oz/28 g) / 6 g  Choose: Skim milk cheese (1 oz/28 g) / 2 to 3 g  Avoid: Cottage cheese, 4% fat (1 cup) / 6.5 g  Choose: Low-fat cottage cheese, 1% fat (1 cup) / 1.5 g  Avoid: Ice cream (1 cup) / 9 g  Choose: Sherbet (1 cup) / 2.5 g  Choose: Nonfat frozen yogurt (1 cup) / 0.3 g  Choose: Frozen fruit bar / trace  Avoid: Whipped cream (1 tbs) / 3.5 g  Choose: Nondairy whipped topping (1 tbs) / 1 g Condiments / Saturated Fat (g)  Avoid: Mayonnaise (1 tbs) / 2 g  Choose: Low-fat mayonnaise (1 tbs) / 1 g  Avoid: Butter (1 tbs) / 7 g  Choose: Extra light margarine (1 tbs) / 1 g  Avoid: Coconut oil (1 tbs) / 11.8 g  Choose: Olive oil (1 tbs) / 1.8 g  Choose: Corn oil (1 tbs) / 1.7 g  Choose: Safflower oil (1 tbs) / 1.2 g  Choose: Sunflower oil (1 tbs) / 1.4 g  Choose: Soybean oil (1 tbs) / 2.4 g  Choose: Canola oil (1 tbs) / 1 g Document Released: 06/30/2005 Document Revised: 10/25/2012 Document Reviewed: 09/28/2013 [employer] Patient Information 2015 [employer]. This information is not intended to replace advice given to you by your health care provider. Make sure you discuss any questions you have with your health care provider.  

## 2014-11-07 NOTE — Assessment & Plan Note (Addendum)
Will repeat A1C today Pneumovax today She does not take flu shots Encouraged her to work on a low carb diet Eye exam is scheduled Foot exam today Continue diabeta

## 2014-11-07 NOTE — Progress Notes (Signed)
Pre visit review using our clinic review tool, if applicable. No additional management support is needed unless otherwise documented below in the visit note. 

## 2014-11-07 NOTE — Progress Notes (Signed)
Subjective:    Patient ID: [name], female    DOB: [birth year], 59 y.o.   MRN: [id number]  HPI  Pt presents to the clinic today for 3 month follow up of DM2 and HLD. She reports her fasting sugars range 69-147. She does not test post prandial sugars. She denies numbness or tingling in her hands or feet. Her last eye exam was 1 year ago, she is scheduled for tomorrow. Flu is not up to date. Pneummovax was in 2009. She is taking Diabeta 5 mg twice daily. Her last  A1C 4 months ago was 7.5 %. Her triglycerides were 473. Her LDL was not calculated. She is on Simvistatin 10 mg daily. She does try to consume a low fat diet.  Review of Systems      Past Medical History  Diagnosis Date  . Asthma   . Seasonal allergies   . Diabetes mellitus without complication 6-38-93    hx. NIDDM-dx. 3 weeks ago  . Ulcerative colitis   . Hyperlipidemia     hx    Current Outpatient Prescriptions  Medication Sig Dispense Refill  . aspirin 81 MG tablet Take 81 mg by mouth daily.    . fluticasone (FLOVENT HFA) 110 MCG/ACT inhaler Inhale 1-2 puffs into the lungs daily as needed (for shortness of breath/wheezing).     Marland Kitchen glyBURIDE (DIABETA) 5 MG tablet Take 1 tablet (5 mg total) by mouth 2 (two) times daily with a meal. 180 tablet 0  . ibuprofen (ADVIL,MOTRIN) 400 MG tablet Take 1 tablet (400 mg total) by mouth every 6 (six) hours as needed. 30 tablet 0  . simvastatin (ZOCOR) 10 MG tablet Take 1 tablet (10 mg total) by mouth at bedtime. 30 tablet 2   No current facility-administered medications for this visit.    Allergies  Allergen Reactions  . Erythromycin Nausea And Vomiting  . Penicillins Nausea And Vomiting  . Tetracycline Nausea And Vomiting    Family History  Problem Relation Age of Onset  . Hypertension Mother   . Diabetes Mother   . Cancer Father     lung ca  . Seizures Daughter     15yr    History   Social History  . Marital Status: Married    Spouse Name: N/A  .  Number of Children: N/A  . Years of Education: N/A   Occupational History  . Not on file.   Social History Main Topics  . Smoking status: Never Smoker   . Smokeless tobacco: Never Used  . Alcohol Use: 0.0 oz/week    0 Standard drinks or equivalent per week     Comment: occasional  . Drug Use: No  . Sexual Activity: Not Currently   Other Topics Concern  . Not on file   Social History Narrative     Constitutional: Denies fever, malaise, fatigue, headache or abrupt weight changes.  Respiratory: Denies difficulty breathing, shortness of breath, cough or sputum production.   Cardiovascular: Denies chest pain, chest tightness, palpitations or swelling in the hands or feet.  Skin: Denies redness, rashes, lesions or ulcercations.  Neurological: Denies dizziness, difficulty with memory, difficulty with speech or problems with balance and coordination.   No other specific complaints in a complete review of systems (except as listed in HPI above).  Objective:   Physical Exam     BP 110/72 mmHg  Pulse 85  Temp(Src) 98.2 F (36.8 C) (Oral)  Wt 155 lb (70.308 kg)  SpO2 98% Wt Readings  from Last 3 Encounters:  11/07/14 155 lb (70.308 kg)  09/28/14 154 lb 6.4 oz (70.035 kg)  09/20/14 154 lb (69.854 kg)    General: Appears her stated age, well developed, well nourished in NAD. Skin: Warm, dry and intact. No rashes, lesions or ulcerations noted. Cardiovascular: Normal rate and rhythm. S1,S2 noted.  No murmur, rubs or gallops noted.  Pulmonary/Chest: Normal effort and positive vesicular breath sounds. No respiratory distress. No wheezes, rales or ronchi noted.  Neurological: Alert and oriented. Sensation intact to BLE.   BMET    Component Value Date/Time   NA 138 06/23/2014 0503   NA 141 10/26/2013 0014   K 4.2 06/23/2014 0503   K 3.5* 10/26/2013 0014   CL 104 06/23/2014 0503   CL 101 10/26/2013 0014   CO2 27 06/23/2014 0503   CO2 28 10/26/2013 0014   GLUCOSE 246*  06/23/2014 0503   GLUCOSE 134* 10/26/2013 0014   BUN 17 06/23/2014 0503   BUN 8 10/26/2013 0014   CREATININE 0.86 06/23/2014 0503   CREATININE 0.74 10/26/2013 0014   CALCIUM 8.7 06/23/2014 0503   CALCIUM 9.2 10/26/2013 0014   GFRNONAA >90 10/26/2013 0014   GFRAA >90 10/26/2013 0014    Lipid Panel     Component Value Date/Time   CHOL  03/12/2009 0143    196        ATP III CLASSIFICATION:  <200     mg/dL   Desirable  200-239  mg/dL   Borderline High  >=240    mg/dL   High          TRIG 207* 03/12/2009 0143   HDL 44 03/12/2009 0143   CHOLHDL 4.5 03/12/2009 0143   VLDL 41* 03/12/2009 0143   LDLCALC * 03/12/2009 0143    111        Total Cholesterol/HDL:CHD Risk Coronary Heart Disease Risk Table                     Men   Women  1/2 Average Risk   3.4   3.3  Average Risk       5.0   4.4  2 X Average Risk   9.6   7.1  3 X Average Risk  23.4   11.0        Use the calculated Patient Ratio above and the CHD Risk Table to determine the patient's CHD Risk.        ATP III CLASSIFICATION (LDL):  <100     mg/dL   Optimal  100-129  mg/dL   Near or Above                    Optimal  130-159  mg/dL   Borderline  160-189  mg/dL   High  >190     mg/dL   Very High    CBC    Component Value Date/Time   WBC 9.3 06/23/2014 0503   WBC 11.2* 10/26/2013 0014   RBC 4.17 06/23/2014 0503   RBC 4.20 10/26/2013 0014   HGB 12.2 06/23/2014 0503   HGB 12.0 10/26/2013 0014   HCT 37.2 06/23/2014 0503   HCT 36.5 10/26/2013 0014   PLT 251 06/23/2014 0503   PLT 214 10/26/2013 0014   MCV 89 06/23/2014 0503   MCV 86.9 10/26/2013 0014   MCH 29.2 06/23/2014 0503   MCH 28.6 10/26/2013 0014   MCHC 32.6 06/23/2014 0503   MCHC 32.9 10/26/2013 0014   RDW 13.6  06/23/2014 0503   RDW 13.4 10/26/2013 0014   LYMPHSABS 1.8 06/23/2014 0503   LYMPHSABS 3.2 10/26/2013 0014   MONOABS 0.4 06/23/2014 0503   MONOABS 0.8 10/26/2013 0014   EOSABS 0.0 06/23/2014 0503   EOSABS 0.2 10/26/2013 0014   BASOSABS  0.0 06/23/2014 0503   BASOSABS 0.0 10/26/2013 0014    Hgb A1C Lab Results  Component Value Date   HGBA1C * 03/12/2009    6.5 (NOTE) The ADA recommends the following therapeutic goal for glycemic control related to Hgb A1c measurement: Goal of therapy: <6.5 Hgb A1c  Reference: American Diabetes Association: Clinical Practice Recommendations 2010, Diabetes Care, 2010, 33: (Suppl  1).    Assessment & Plan:

## 2014-11-07 NOTE — Assessment & Plan Note (Signed)
Will recheck lipid profile and CMET today Handout given on low fat diet Continue Zocor daily

## 2014-11-08 LAB — LDL CHOLESTEROL, DIRECT: Direct LDL: 76 mg/dL

## 2014-11-08 LAB — LIPID PANEL
CHOLESTEROL: 223 mg/dL — AB (ref 0–200)
HDL: 43.6 mg/dL (ref >39.00)
NonHDL: 179.4
Total CHOL/HDL Ratio: 5
Triglycerides: 385 mg/dL — ABNORMAL HIGH (ref 0.0–149.0)
VLDL: 77 mg/dL — ABNORMAL HIGH (ref 0.0–40.0)

## 2014-11-08 LAB — COMPREHENSIVE METABOLIC PANEL
ALBUMIN: 4 g/dL (ref 3.5–5.2)
ALT: 66 U/L — ABNORMAL HIGH (ref 0–35)
AST: 56 U/L — ABNORMAL HIGH (ref 0–37)
Alkaline Phosphatase: 114 U/L (ref 39–117)
BUN: 12 mg/dL (ref 6–23)
CHLORIDE: 103 meq/L (ref 96–112)
CO2: 32 mEq/L (ref 19–32)
Calcium: 9.5 mg/dL (ref 8.4–10.5)
Creatinine, Ser: 1.36 mg/dL — ABNORMAL HIGH (ref 0.40–1.20)
GFR: 51.41 mL/min — ABNORMAL LOW (ref >60.00)
Glucose, Bld: 105 mg/dL — ABNORMAL HIGH (ref 70–99)
Potassium: 3.9 mEq/L (ref 3.5–5.1)
Sodium: 139 mEq/L (ref 135–145)
Total Bilirubin: 0.3 mg/dL (ref 0.2–1.2)
Total Protein: 6.9 g/dL (ref 6.0–8.3)

## 2014-11-08 LAB — HEMOGLOBIN A1C: HEMOGLOBIN A1C: 6.7 % — AB (ref 4.6–6.5)

## 2014-11-08 NOTE — Addendum Note (Signed)
Addended by: Lurlean Nanny on: 11/08/2014 10:38 AM   Modules accepted: Orders

## 2014-11-08 NOTE — H&P (Signed)
PATIENT NAME:  [name], [name] MR#:  [id number] DATE OF BIRTH:  05-24-1956  DATE OF ADMISSION:  06/22/2014   ADMITTING PHYSICIAN: Gladstone Lighter, MD    PRIMARY CARE PHYSICIAN: Nonlocal, in [place].   CHIEF COMPLAINT: Chest pain.   HISTORY OF PRESENT ILLNESS: Ms. Hinks is a 59 year old African American female with past medical history significant for diet-controlled diabetes mellitus and asthma, now presents to the hospital secondary to chest pressure that started this afternoon. The patient denies any known cardiac history other than a benign cardiac murmur. Does not smoke. Family history of coronary artery disease in mom with chronic angina but no intervention done.  She states that she was doing fine up until this afternoon when she all of sudden, while walking, experienced heavy chest pressure associated with nausea and difficulty breathing.  It lasted until the patient presented to the Emergency Room and had aspirin.  She had never had these symptoms before, no dyspnea or pain on exertion in the past.  She was recently seen about 4 days ago for asthma exacerbation and was started on prednisone taper, cough syrup.  She states, her cough is better and this is definitely not pain related to chronic coughing.  She is chest pain free at this time and is being admitted under observation for chest pain.   PAST MEDICAL HISTORY:  1.  Asthma.  2.  Diet-controlled diabetes mellitus.  3.  Benign heart murmur.   PAST SURGICAL HISTORY:  1.  C-sections twice.  2.  Cholecystectomy.   ALLERGIES TO MEDICATIONS: ERYTHROMYCIN, PENICILLIN, TETRACYCLINE.   CURRENT HOME MEDICATIONS:   1.  Not on any prescription medications at home, but is on prednisone taper since last Sunday.  2.  Hydrocodone cough syrup.  3.  Nasal spray which we all started about 4 days ago.   SOCIAL HISTORY: Lives at home with her mom, occasional alcohol use. No smoking or other drug use, very active and ambulatory at  baseline.   FAMILY HISTORY: Mom with chronic angina. No other significant history in the family.   REVIEW OF SYSTEMS:  CONSTITUTIONAL: No fever, fatigue, or weakness.  EYES: No blurred vision, double vision, inflammation or glaucoma.  ENT: No tinnitus, ear pain, hearing loss, epistaxis or discharge.    RESPIRATORY: No cough, wheeze, hemoptysis or chronic obstructive pulmonary disease.  CARDIOVASCULAR: Positive for chest pressure, diaphoresis. No palpitations, arrhythmia, or syncope.  GASTROINTESTINAL:  Positive for nausea. No vomiting, diarrhea, abdominal pain, hematemesis, or melena.  GENITOURINARY: No dysuria, hematuria, renal calculus, frequency, or incontinence.  ENDOCRINE: No polyuria, nocturia, thyroid problems, heat or cold intolerance.  HEMATOLOGY: No anemia, easy bruising or bleeding.  SKIN: No acne, rash or lesions.  MUSCULOSKELETAL: No neck, back, shoulder pain, arthritis or gout.  NEUROLOGIC: No numbness, weakness, CVA, transient ischemic attack or seizures.  PSYCHIATRIC: No anxiety, insomnia, depression.   PHYSICAL EXAMINATION:  VITAL SIGNS: Temperature 98.7 degrees Fahrenheit, pulse 92, respirations 20, blood pressure 130/83, pulse oximetry 98% on room air.  GENERAL: Well-built, well-nourished female lying in bed, not in any acute distress.  HEENT: Normocephalic, atraumatic. Pupils equal, round, reacting to light. Anicteric sclerae. Extraocular movements intact.  OROPHARYNX: Clear without erythema, mass or exudates.  NECK: Supple. No thyromegaly, JVD or carotid bruits. No lymphadenopathy.  LUNGS: Moving air bilaterally. No wheeze or crackles. No use of accessory muscles for breathing.  CARDIOVASCULAR: S1, S2, regular rate and rhythm. No murmurs, rubs, or gallops.  ABDOMEN: Soft, nontender, nondistended. No hepatosplenomegaly. Normal bowel sounds.  EXTREMITIES:  No pedal edema. No clubbing or cyanosis, 2+ dorsalis pedis pulses palpable bilaterally.  SKIN: No acne, rash or  lesions.  LYMPHATICS: No cervical or inguinal lymphadenopathy.  NEUROLOGIC: Cranial nerves II through XII remain intact. Motor strength is 5/5 bilateral extremities. Sensation intact. cerebellar function tests of being normal.  PSYCHIATRIC:  The patient is awake, alert, oriented x 3.   LABORATORY DATA: WBC 8.6, hemoglobin 12.3, hematocrit 37.9, platelet count 257,000.   Sodium 141, potassium 3.2, chloride 104, bicarbonate 31, BUN 14, creatinine 0.9.  Glucose 200 and calcium of 8.0.  ALT 45, AST 41, alkaline phosphatase 123, total bilirubin 0.4, and albumin of 3.1.  D-dimer 425 within normal limits.  Her troponin is negative.  Chest x-ray showing clear lung fields. No acute cardiopulmonary disease.  EKG showing normal sinus rhythm. No acute ST-T wave abnormalities.  There is T-wave inversion noted in the lateral leads, from V4 to V6 appears nonspecific.   ASSESSMENT AND PLAN: A 59 year old female with history of asthma and diet-controlled diabetes admitted for chest pain.  1.  Chest pain, will admit under  observation.   Risk factors could be family history, or it could be just like recent asthma attack-related pain.  Currently pain free.  We will get an IV in the a.m.  Lipid profile HbA1c check, nitroglycerin p.r.n. if pain happens.  2.  Continue aspirin for now.  3.  Asthma, with recent exacerbation recovering.  Continue prednisone taper.  4.  Hypokalemia, being replaced.  5.  Diet-controlled diabetes mellitus. Check HbA1c.  Sliding scale insulin while on prednisone taper.  6.  Deep vein thrombosis prophylaxis.   CODE STATUS: FULL CODE.   TIME SPENT ON ADMISSION: 50 minutes.     ____________________________ Gladstone Lighter, MD rk:DT D: 06/22/2014 18:47:20 ET T: 06/22/2014 19:34:43 ET JOB#: [id number]  cc: Gladstone Lighter, MD, <Dictator> Gladstone Lighter MD ELECTRONICALLY SIGNED 07/18/2014 13:42

## 2014-11-10 ENCOUNTER — Ambulatory Visit: Payer: Self-pay | Admitting: Internal Medicine

## 2014-11-14 ENCOUNTER — Encounter: Payer: Self-pay | Admitting: Internal Medicine

## 2014-11-14 NOTE — Progress Notes (Unsigned)
Patient ID: [name], female   DOB: 1956/06/25, 59 y.o.   MRN: [id number] The patient's chart has been reviewed by Dr. Carlean Purl  and the recommendations are noted below.      Follow-up advised. Schedule patient for next available appointment. Outcome of communication with the patient: Letter mailed

## 2014-11-29 ENCOUNTER — Telehealth: Payer: Self-pay | Admitting: Internal Medicine

## 2014-11-29 ENCOUNTER — Encounter: Payer: BLUE CROSS/BLUE SHIELD | Admitting: Internal Medicine

## 2014-11-29 DIAGNOSIS — Z0289 Encounter for other administrative examinations: Secondary | ICD-10-CM

## 2014-11-29 NOTE — Telephone Encounter (Signed)
Yes she should reschedule

## 2014-11-29 NOTE — Telephone Encounter (Signed)
Patient did not come in for their appointment today for cpe with pap.  Please let me know if patient needs to be contacted immediately for follow up or no follow up needed.

## 2014-11-30 NOTE — Telephone Encounter (Signed)
L/m for pt to r/s appt cpe

## 2014-12-05 NOTE — Telephone Encounter (Signed)
L/m for pt to call back to r/s appt

## 2014-12-06 NOTE — Telephone Encounter (Signed)
L/m for pt to call bac to r/s cpe

## 2015-01-08 ENCOUNTER — Other Ambulatory Visit: Payer: Self-pay

## 2015-03-02 ENCOUNTER — Encounter: Payer: Self-pay | Admitting: Internal Medicine

## 2015-03-13 ENCOUNTER — Encounter: Payer: BLUE CROSS/BLUE SHIELD | Admitting: Internal Medicine

## 2015-03-13 DIAGNOSIS — Z0289 Encounter for other administrative examinations: Secondary | ICD-10-CM

## 2015-03-14 ENCOUNTER — Telehealth: Payer: Self-pay | Admitting: Internal Medicine

## 2015-03-14 NOTE — Telephone Encounter (Signed)
Yes, she needs to reschedule and if she continues to no show she will be dismissed.

## 2015-03-14 NOTE — Telephone Encounter (Signed)
Pt did not come in for their appt today for CPE. Please let me know if pt needs to be contacted immediately for follow up or no follow up needed. Best phone number to contact pt is [phone].

## 2015-03-20 NOTE — Telephone Encounter (Signed)
Called, no answer/machine to leave message.

## 2015-03-26 ENCOUNTER — Ambulatory Visit (INDEPENDENT_AMBULATORY_CARE_PROVIDER_SITE_OTHER): Payer: BLUE CROSS/BLUE SHIELD | Admitting: Internal Medicine

## 2015-03-26 ENCOUNTER — Encounter: Payer: Self-pay | Admitting: Internal Medicine

## 2015-03-26 VITALS — BP 108/80 | HR 79 | Temp 98.2°F | Wt 145.0 lb

## 2015-03-26 DIAGNOSIS — T148 Other injury of unspecified body region: Secondary | ICD-10-CM | POA: Diagnosis not present

## 2015-03-26 DIAGNOSIS — T148XXA Other injury of unspecified body region, initial encounter: Secondary | ICD-10-CM

## 2015-03-26 MED ORDER — KETOROLAC TROMETHAMINE 30 MG/ML IJ SOLN
30.0000 mg | Freq: Once | INTRAMUSCULAR | Status: AC
  Administered 2015-03-26: 30 mg via INTRAMUSCULAR

## 2015-03-26 MED ORDER — METHOCARBAMOL 500 MG PO TABS: 500.0000 mg | ORAL_TABLET | Freq: Every day | ORAL | Status: DC

## 2015-03-26 MED ORDER — TRAMADOL HCL 50 MG PO TABS: 50.0000 mg | ORAL_TABLET | Freq: Three times a day (TID) | ORAL | Status: DC | PRN

## 2015-03-26 NOTE — Progress Notes (Signed)
Subjective:    Patient ID: [name], female    DOB: 24-Nov-1955, 59 y.o.   MRN: [id number]  HPI  Pt presents to the clinic today with c/o low back pain. She reports this started 1 week ago but got worse yesterday. It has been intermittent. She describes the pain as throbbing and sharp. The pain radiates into her legs. She denies numbness or tingling, loss of bowel or bladder. The pain is worse with sitting and laying down. She denies any injury to the area but reports she started a new job 3 months ago where she does a lot of bending and lifting. She has tried Ibuprofen and Tylenol without relief.  Review of Systems      Past Medical History  Diagnosis Date  . Asthma   . Seasonal allergies   . Diabetes mellitus without complication 7-65-46    hx. NIDDM-dx. 3 weeks ago  . Ulcerative colitis   . Hyperlipidemia     hx    Current Outpatient Prescriptions  Medication Sig Dispense Refill  . aspirin 81 MG tablet Take 81 mg by mouth daily.    . fluticasone (FLOVENT HFA) 110 MCG/ACT inhaler Inhale 1-2 puffs into the lungs daily as needed (for shortness of breath/wheezing).     Marland Kitchen glyBURIDE (DIABETA) 5 MG tablet Take 1 tablet (5 mg total) by mouth 2 (two) times daily with a meal. 180 tablet 0  . ibuprofen (ADVIL,MOTRIN) 400 MG tablet Take 1 tablet (400 mg total) by mouth every 6 (six) hours as needed. 30 tablet 0  . simvastatin (ZOCOR) 10 MG tablet Take 1 tablet (10 mg total) by mouth at bedtime. 30 tablet 2   No current facility-administered medications for this visit.    Allergies  Allergen Reactions  . Erythromycin Nausea And Vomiting  . Penicillins Nausea And Vomiting  . Tetracycline Nausea And Vomiting    Family History  Problem Relation Age of Onset  . Hypertension Mother   . Diabetes Mother   . Cancer Father     lung ca  . Seizures Daughter     42yr    Social History   Social History  . Marital Status: Married    Spouse Name: N/A  . Number of  Children: N/A  . Years of Education: N/A   Occupational History  . Not on file.   Social History Main Topics  . Smoking status: Never Smoker   . Smokeless tobacco: Never Used  . Alcohol Use: 0.0 oz/week    0 Standard drinks or equivalent per week     Comment: occasional  . Drug Use: No  . Sexual Activity: Not Currently   Other Topics Concern  . Not on file   Social History Narrative     Constitutional: Denies fever, malaise, fatigue, headache or abrupt weight changes.  Respiratory: Denies difficulty breathing, shortness of breath, cough or sputum production.   Cardiovascular: Denies chest pain, chest tightness, palpitations or swelling in the hands or feet.  Gastrointestinal: Denies abdominal pain, bloating, constipation, diarrhea or blood in the stool.  GU: Denies urgency, frequency, pain with urination, burning sensation, blood in urine, odor or discharge. Musculoskeletal: Pt reports back pain. Denies decrease in range of motion, difficulty with gait, muscle pain or joint swelling.  Skin: Denies redness, rashes, lesions or ulcercations.  Neurological: Denies dizziness, difficulty with memory, difficulty with speech or problems with balance and coordination.   No other specific complaints in a complete review of systems (except as listed  in HPI above).  Objective:   Physical Exam  Pulse 79  Temp(Src) 98.2 F (36.8 C) (Oral)  Wt 145 lb (65.772 kg) Wt Readings from Last 3 Encounters:  03/26/15 145 lb (65.772 kg)  11/07/14 155 lb (70.308 kg)  09/28/14 154 lb 6.4 oz (70.035 kg)    General: Appears her stated age, well developed, well nourished in NAD. Cardiovascular: Normal rate and rhythm. S1,S2 noted.  No murmur, rubs or gallops noted.  Pulmonary/Chest: Normal effort and positive vesicular breath sounds. No respiratory distress. No wheezes, rales or ronchi noted.  Musculoskeletal: Decreased flexion, extension and rotation of the spine. Mile pain with palpation of the  lumbar spine. Pain with palpation of the paralumbar muscles. Very tense muscles noted. Strength 4/5 BLE. Neurological: Alert and oriented. Sensation intact to BLE.  BMET    Component Value Date/Time   NA 139 11/07/2014 1651   NA 138 06/23/2014 0503   K 3.9 11/07/2014 1651   K 4.2 06/23/2014 0503   CL 103 11/07/2014 1651   CL 104 06/23/2014 0503   CO2 32 11/07/2014 1651   CO2 27 06/23/2014 0503   GLUCOSE 105* 11/07/2014 1651   GLUCOSE 246* 06/23/2014 0503   BUN 12 11/07/2014 1651   BUN 17 06/23/2014 0503   CREATININE 1.36* 11/07/2014 1651   CREATININE 0.86 06/23/2014 0503   CALCIUM 9.5 11/07/2014 1651   CALCIUM 8.7 06/23/2014 0503   GFRNONAA >60 06/23/2014 0503   GFRNONAA >90 10/26/2013 0014   GFRAA >60 06/23/2014 0503   GFRAA >90 10/26/2013 0014    Lipid Panel     Component Value Date/Time   CHOL 223* 11/07/2014 1651   CHOL 211* 06/22/2014 1432   TRIG 385.0* 11/07/2014 1651   TRIG 473* 06/22/2014 1432   HDL 43.60 11/07/2014 1651   HDL 42 06/22/2014 1432   CHOLHDL 5 11/07/2014 1651   VLDL 77.0* 11/07/2014 1651   VLDL SEE COMMENT 06/22/2014 1432   LDLCALC SEE COMMENT 06/22/2014 1432   LDLCALC * 03/12/2009 0143    111        Total Cholesterol/HDL:CHD Risk Coronary Heart Disease Risk Table                     Men   Women  1/2 Average Risk   3.4   3.3  Average Risk       5.0   4.4  2 X Average Risk   9.6   7.1  3 X Average Risk  23.4   11.0        Use the calculated Patient Ratio above and the CHD Risk Table to determine the patient's CHD Risk.        ATP III CLASSIFICATION (LDL):  <100     mg/dL   Optimal  100-129  mg/dL   Near or Above                    Optimal  130-159  mg/dL   Borderline  160-189  mg/dL   High  >190     mg/dL   Very High    CBC    Component Value Date/Time   WBC 9.3 06/23/2014 0503   WBC 11.2* 10/26/2013 0014   RBC 4.17 06/23/2014 0503   RBC 4.20 10/26/2013 0014   HGB 12.2 06/23/2014 0503   HGB 12.0 10/26/2013 0014   HCT 37.2  06/23/2014 0503   HCT 36.5 10/26/2013 0014   PLT 251 06/23/2014 0503   PLT  214 10/26/2013 0014   MCV 89 06/23/2014 0503   MCV 86.9 10/26/2013 0014   MCH 29.2 06/23/2014 0503   MCH 28.6 10/26/2013 0014   MCHC 32.6 06/23/2014 0503   MCHC 32.9 10/26/2013 0014   RDW 13.6 06/23/2014 0503   RDW 13.4 10/26/2013 0014   LYMPHSABS 1.8 06/23/2014 0503   LYMPHSABS 3.2 10/26/2013 0014   MONOABS 0.4 06/23/2014 0503   MONOABS 0.8 10/26/2013 0014   EOSABS 0.0 06/23/2014 0503   EOSABS 0.2 10/26/2013 0014   BASOSABS 0.0 06/23/2014 0503   BASOSABS 0.0 10/26/2013 0014    Hgb A1C Lab Results  Component Value Date   HGBA1C 6.7* 11/07/2014         Assessment & Plan:   Muscle strain of lower back:  Toradol 30 mg IM today eRx for Robaxin 500 mg at night as needed for back pain eRx for Tramadol 50 mg TID prn (do not take until at least 6 hours after your Toradol injection) Stretching exercises given Work note provided to return Wednesday  RTC as needed or if symptoms persist or worsen

## 2015-03-26 NOTE — Progress Notes (Signed)
Pre visit review using our clinic review tool, if applicable. No additional management support is needed unless otherwise documented below in the visit note. 

## 2015-03-26 NOTE — Patient Instructions (Signed)
Back Exercises These exercises may help you when beginning to rehabilitate your injury. Your symptoms may resolve with or without further involvement from your physician, physical therapist or athletic trainer. While completing these exercises, remember:   Restoring tissue flexibility helps normal motion to return to the joints. This allows healthier, less painful movement and activity.  An effective stretch should be held for at least 30 seconds.  A stretch should never be painful. You should only feel a gentle lengthening or release in the stretched tissue. STRETCH - Extension, Prone on Elbows   Lie on your stomach on the floor, a bed will be too soft. Place your palms about shoulder width apart and at the height of your head.  Place your elbows under your shoulders. If this is too painful, stack pillows under your chest.  Allow your body to relax so that your hips drop lower and make contact more completely with the floor.  Hold this position for __________ seconds.  Slowly return to lying flat on the floor. Repeat __________ times. Complete this exercise __________ times per day.  RANGE OF MOTION - Extension, Prone Press Ups   Lie on your stomach on the floor, a bed will be too soft. Place your palms about shoulder width apart and at the height of your head.  Keeping your back as relaxed as possible, slowly straighten your elbows while keeping your hips on the floor. You may adjust the placement of your hands to maximize your comfort. As you gain motion, your hands will come more underneath your shoulders.  Hold this position __________ seconds.  Slowly return to lying flat on the floor. Repeat __________ times. Complete this exercise __________ times per day.  RANGE OF MOTION- Quadruped, Neutral Spine   Assume a hands and knees position on a firm surface. Keep your hands under your shoulders and your knees under your hips. You may place padding under your knees for  comfort.  Drop your head and point your tail bone toward the ground below you. This will round out your low back like an angry cat. Hold this position for __________ seconds.  Slowly lift your head and release your tail bone so that your back sags into a large arch, like an old horse.  Hold this position for __________ seconds.  Repeat this until you feel limber in your low back.  Now, find your "sweet spot." This will be the most comfortable position somewhere between the two previous positions. This is your neutral spine. Once you have found this position, tense your stomach muscles to support your low back.  Hold this position for __________ seconds. Repeat __________ times. Complete this exercise __________ times per day.  STRETCH - Flexion, Single Knee to Chest   Lie on a firm bed or floor with both legs extended in front of you.  Keeping one leg in contact with the floor, bring your opposite knee to your chest. Hold your leg in place by either grabbing behind your thigh or at your knee.  Pull until you feel a gentle stretch in your low back. Hold __________ seconds.  Slowly release your grasp and repeat the exercise with the opposite side. Repeat __________ times. Complete this exercise __________ times per day.  STRETCH - Hamstrings, Standing  Stand or sit and extend your right / left leg, placing your foot on a chair or foot stool  Keeping a slight arch in your low back and your hips straight forward.  Lead with your chest and  lean forward at the waist until you feel a gentle stretch in the back of your right / left knee or thigh. (When done correctly, this exercise requires leaning only a small distance.)  Hold this position for __________ seconds. Repeat __________ times. Complete this stretch __________ times per day. STRENGTHENING - Deep Abdominals, Pelvic Tilt   Lie on a firm bed or floor. Keeping your legs in front of you, bend your knees so they are both pointed  toward the ceiling and your feet are flat on the floor.  Tense your lower abdominal muscles to press your low back into the floor. This motion will rotate your pelvis so that your tail bone is scooping upwards rather than pointing at your feet or into the floor.  With a gentle tension and even breathing, hold this position for __________ seconds. Repeat __________ times. Complete this exercise __________ times per day.  STRENGTHENING - Abdominals, Crunches   Lie on a firm bed or floor. Keeping your legs in front of you, bend your knees so they are both pointed toward the ceiling and your feet are flat on the floor. Cross your arms over your chest.  Slightly tip your chin down without bending your neck.  Tense your abdominals and slowly lift your trunk high enough to just clear your shoulder blades. Lifting higher can put excessive stress on the low back and does not further strengthen your abdominal muscles.  Control your return to the starting position. Repeat __________ times. Complete this exercise __________ times per day.  STRENGTHENING - Quadruped, Opposite UE/LE Lift   Assume a hands and knees position on a firm surface. Keep your hands under your shoulders and your knees under your hips. You may place padding under your knees for comfort.  Find your neutral spine and gently tense your abdominal muscles so that you can maintain this position. Your shoulders and hips should form a rectangle that is parallel with the floor and is not twisted.  Keeping your trunk steady, lift your right hand no higher than your shoulder and then your left leg no higher than your hip. Make sure you are not holding your breath. Hold this position __________ seconds.  Continuing to keep your abdominal muscles tense and your back steady, slowly return to your starting position. Repeat with the opposite arm and leg. Repeat __________ times. Complete this exercise __________ times per day. Document Released:  07/18/2005 Document Revised: 09/22/2011 Document Reviewed: 10/12/2008 [facility] Patient Information 2015 [place], [place]. This information is not intended to replace advice given to you by your health care provider. Make sure you discuss any questions you have with your health care provider.

## 2015-03-28 ENCOUNTER — Encounter: Payer: Self-pay | Admitting: Internal Medicine

## 2015-03-29 ENCOUNTER — Encounter: Payer: Self-pay | Admitting: Internal Medicine

## 2015-03-29 ENCOUNTER — Ambulatory Visit (INDEPENDENT_AMBULATORY_CARE_PROVIDER_SITE_OTHER): Payer: BLUE CROSS/BLUE SHIELD | Admitting: Internal Medicine

## 2015-03-29 VITALS — BP 106/72 | HR 60 | Temp 97.9°F | Wt 144.0 lb

## 2015-03-29 DIAGNOSIS — J309 Allergic rhinitis, unspecified: Secondary | ICD-10-CM

## 2015-03-29 MED ORDER — METHYLPREDNISOLONE ACETATE 80 MG/ML IJ SUSP
80.0000 mg | Freq: Once | INTRAMUSCULAR | Status: AC
Start: 2015-03-29 — End: 2015-03-29
  Administered 2015-03-29: 80 mg via INTRAMUSCULAR

## 2015-03-29 NOTE — Addendum Note (Signed)
Addended by: Lurlean Nanny on: 03/29/2015 02:41 PM   Modules accepted: Orders

## 2015-03-29 NOTE — Progress Notes (Signed)
HPI  Pt presents to the clinic today with c/o headache, runny nose, sore throat, cough and chest congestion. This started yesterday. She is blowing clear mucous out of her nose. The cough is nonproductive. She has had some mild shortness of breath. She denies fever, chills or body aches. She has taken Nasocort and Flovent without any relief. She does have a history of seasonal allergies and asthma. She has not had sick contacts that she is aware of.  Review of Systems    Past Medical History  Diagnosis Date  . Asthma   . Seasonal allergies   . Diabetes mellitus without complication 12-25-41    hx. NIDDM-dx. 3 weeks ago  . Ulcerative colitis   . Hyperlipidemia     hx    Family History  Problem Relation Age of Onset  . Hypertension Mother   . Diabetes Mother   . Cancer Father     lung ca  . Seizures Daughter     5yr    Social History   Social History  . Marital Status: Married    Spouse Name: N/A  . Number of Children: N/A  . Years of Education: N/A   Occupational History  . Not on file.   Social History Main Topics  . Smoking status: Never Smoker   . Smokeless tobacco: Never Used  . Alcohol Use: 0.0 oz/week    0 Standard drinks or equivalent per week     Comment: occasional  . Drug Use: No  . Sexual Activity: Not Currently   Other Topics Concern  . Not on file   Social History Narrative    Allergies  Allergen Reactions  . Erythromycin Nausea And Vomiting  . Penicillins Nausea And Vomiting  . Tetracycline Nausea And Vomiting     Constitutional: Positive headache. Denies fatigue, fever or abrupt weight changes.  HEENT:  Positive  Runny nose and sore throat. Denies eye redness, ear pain, ringing in the ears, wax buildup, nasal congestion or bloody nose. Respiratory: Positive cough and shortness of breath. Denies difficulty breathing.  Cardiovascular: Denies chest pain, chest tightness, palpitations or swelling in the hands or feet.   No other specific  complaints in a complete review of systems (except as listed in HPI above).  Objective:  BP 106/72 mmHg  Pulse 60  Temp(Src) 97.9 F (36.6 C) (Oral)  Wt 144 lb (65.318 kg)   General: Appears her stated age, well developed, well nourished in NAD. HEENT: Head: normal shape and size, no sinus tenderness noted; Eyes: sclera white, no icterus, conjunctiva pink; Ears: Tm's gray and intact, normal light reflex, + serous effusion noted on the left; Nose: mucosa boggy and moist, septum midline; Throat/Mouth: + PND. Teeth present, mucosa erythematous and moist, no exudate noted, no lesions or ulcerations noted.  Neck:  No adenopathy noted. Cardiovascular: Normal rate and rhythm. S1,S2 noted.  No murmur, rubs or gallops noted.  Pulmonary/Chest: Normal effort and diminshed breath sounds. No respiratory distress. No wheezes, rales or ronchi noted.      Assessment & Plan:   Allergic Rhinitis  Can use a Neti Pot which can be purchased from your local drug store. Flonase 2 sprays each nostril for 3 days and then as needed. Allegra- take as directed every night 80 mg Depo IM today Work note provided  RTC as needed or if symptoms persist.

## 2015-03-29 NOTE — Progress Notes (Signed)
Pre visit review using our clinic review tool, if applicable. No additional management support is needed unless otherwise documented below in the visit note. 

## 2015-03-29 NOTE — Patient Instructions (Signed)

## 2015-04-16 ENCOUNTER — Ambulatory Visit: Payer: Self-pay | Admitting: Internal Medicine

## 2015-04-16 ENCOUNTER — Ambulatory Visit (INDEPENDENT_AMBULATORY_CARE_PROVIDER_SITE_OTHER): Payer: BLUE CROSS/BLUE SHIELD | Admitting: Internal Medicine

## 2015-04-16 ENCOUNTER — Encounter: Payer: Self-pay | Admitting: Internal Medicine

## 2015-04-16 VITALS — BP 112/78 | HR 85 | Temp 98.4°F | Wt 140.0 lb

## 2015-04-16 DIAGNOSIS — R05 Cough: Secondary | ICD-10-CM | POA: Diagnosis not present

## 2015-04-16 DIAGNOSIS — R252 Cramp and spasm: Secondary | ICD-10-CM

## 2015-04-16 DIAGNOSIS — R059 Cough, unspecified: Secondary | ICD-10-CM

## 2015-04-16 MED ORDER — ALBUTEROL SULFATE HFA 108 (90 BASE) MCG/ACT IN AERS: 2.0000 | INHALATION_SPRAY | Freq: Four times a day (QID) | RESPIRATORY_TRACT | Status: DC | PRN

## 2015-04-16 MED ORDER — HYDROCODONE-HOMATROPINE 5-1.5 MG/5ML PO SYRP: 5.0000 mL | ORAL_SOLUTION | Freq: Three times a day (TID) | ORAL | Status: DC | PRN

## 2015-04-16 NOTE — Progress Notes (Signed)
HPI  Pt presents to the clinic today with c/o cough and chest congestion. This started 2-3 days ago. The cough is non productive. She denies wheezing or shortness of breath. She denies runny nose, nasal congestion or sore throat. She has tried Ad[employer] account planner with no relief. She does have a history of allergies and asthma. She has not had sick contacts that she is aware of.  She has also been having muscle cramps in her legs. This has been intermittent. She denies pain or swelling. She has taken mustard with some relief.  Review of Systems      Past Medical History  Diagnosis Date  . Asthma   . Seasonal allergies   . Diabetes mellitus without complication ([place]) 6-73-41    hx. NIDDM-dx. 3 weeks ago  . Ulcerative colitis ([place])   . Hyperlipidemia     hx    Family History  Problem Relation Age of Onset  . Hypertension Mother   . Diabetes Mother   . Cancer Father     lung ca  . Seizures Daughter     21yr    Social History   Social History  . Marital Status: Married    Spouse Name: N/A  . Number of Children: N/A  . Years of Education: N/A   Occupational History  . Not on file.   Social History Main Topics  . Smoking status: Never Smoker   . Smokeless tobacco: Never Used  . Alcohol Use: 0.0 oz/week    0 Standard drinks or equivalent per week     Comment: occasional  . Drug Use: No  . Sexual Activity: Not Currently   Other Topics Concern  . Not on file   Social History Narrative    Allergies  Allergen Reactions  . Erythromycin Nausea And Vomiting  . Penicillins Nausea And Vomiting  . Tetracycline Nausea And Vomiting     Constitutional: Denies headache, fatigue, fever or abrupt weight changes.  HEENT:  Denies eye redness, eye pain, pressure behind the eyes, facial pain, nasal congestion, ear pain, ringing in the ears, wax buildup, runny nose or sore throat. Respiratory: Positive cough. Denies difficulty breathing or shortness of breath.  Cardiovascular:  Denies chest pain, chest tightness, palpitations or swelling in the hands or feet.  MSK: Pt reports muscle cramps. Denies joint pain or swelling.  No other specific complaints in a complete review of systems (except as listed in HPI above).  Objective:   BP 112/78 mmHg  Pulse 85  Temp(Src) 98.4 F (36.9 C) (Oral)  Wt 140 lb (63.504 kg)  SpO2 98%  Wt Readings from Last 3 Encounters:  04/16/15 140 lb (63.504 kg)  03/29/15 144 lb (65.318 kg)  03/26/15 145 lb (65.772 kg)     General: Appears her stated age, well developed, well nourished in NAD. HEENT: Head: normal shape and size, no sinus tenderness noted; Eyes: sclera white, no icterus, conjunctiva pink; Ears: Tm's gray and intact, normal light reflex; Throat/Mouth: Teeth present, mucosa pink and moist, no exudate noted, no lesions or ulcerations noted.  Neck: No cervical lymphadenopathy.  Cardiovascular: Normal rate and rhythm. S1,S2 noted. No murmur, rubs or gallops noted.  Pulmonary/Chest: Normal effort and positive vesicular breath sounds. No respiratory distress. No wheezes, rales or ronchi noted.      Assessment & Plan:   Cough:  Benign Refilled Albuterol inhaler Rx for Hycodan cough syrup  Muscle cramps:  Push fluids Check CMET today  RTC as needed or if symptoms persist.

## 2015-04-16 NOTE — Patient Instructions (Signed)
Cough, Adult  A cough is a reflex that helps clear your throat and airways. It can help heal the body or may be a reaction to an irritated airway. A cough may only last 2 or 3 weeks (acute) or may last more than 8 weeks (chronic).  CAUSES Acute cough:  Viral or bacterial infections. Chronic cough:  Infections.  Allergies.  Asthma.  Post-nasal drip.  Smoking.  Heartburn or acid reflux.  Some medicines.  Chronic lung problems (COPD).  Cancer. SYMPTOMS   Cough.  Fever.  Chest pain.  Increased breathing rate.  High-pitched whistling sound when breathing (wheezing).  Colored mucus that you cough up (sputum). TREATMENT   A bacterial cough may be treated with antibiotic medicine.  A viral cough must run its course and will not respond to antibiotics.  Your caregiver may recommend other treatments if you have a chronic cough. HOME CARE INSTRUCTIONS   Only take over-the-counter or prescription medicines for pain, discomfort, or fever as directed by your caregiver. Use cough suppressants only as directed by your caregiver.  Use a cold steam vaporizer or humidifier in your bedroom or home to help loosen secretions.  Sleep in a semi-upright position if your cough is worse at night.  Rest as needed.  Stop smoking if you smoke. SEEK IMMEDIATE MEDICAL CARE IF:   You have pus in your sputum.  Your cough starts to worsen.  You cannot control your cough with suppressants and are losing sleep.  You begin coughing up blood.  You have difficulty breathing.  You develop pain which is getting worse or is uncontrolled with medicine.  You have a fever. MAKE SURE YOU:   Understand these instructions.  Will watch your condition.  Will get help right away if you are not doing well or get worse. Document Released: 12/27/2010 Document Revised: 09/22/2011 Document Reviewed: 12/27/2010 [employer] Patient Information 2015 [employer]. This information is not intended  to replace advice given to you by your health care provider. Make sure you discuss any questions you have with your health care provider.  

## 2015-04-16 NOTE — Progress Notes (Signed)
Pre visit review using our clinic review tool, if applicable. No additional management support is needed unless otherwise documented below in the visit note. 

## 2015-04-17 LAB — COMPREHENSIVE METABOLIC PANEL
ALK PHOS: 95 U/L (ref 39–117)
ALT: 14 U/L (ref 0–35)
AST: 16 U/L (ref 0–37)
Albumin: 4 g/dL (ref 3.5–5.2)
BILIRUBIN TOTAL: 0.4 mg/dL (ref 0.2–1.2)
BUN: 14 mg/dL (ref 6–23)
CALCIUM: 9.7 mg/dL (ref 8.4–10.5)
CO2: 29 mEq/L (ref 19–32)
Chloride: 106 mEq/L (ref 96–112)
Creatinine, Ser: 0.84 mg/dL (ref 0.40–1.20)
GFR: 89.5 mL/min (ref >60.00)
GLUCOSE: 77 mg/dL (ref 70–99)
POTASSIUM: 3.8 meq/L (ref 3.5–5.1)
Sodium: 142 mEq/L (ref 135–145)
TOTAL PROTEIN: 7 g/dL (ref 6.0–8.3)

## 2015-10-08 ENCOUNTER — Encounter (HOSPITAL_COMMUNITY): Payer: Self-pay | Admitting: Emergency Medicine

## 2015-10-08 ENCOUNTER — Emergency Department (HOSPITAL_COMMUNITY)
Admission: EM | Admit: 2015-10-08 | Discharge: 2015-10-08 | Disposition: A | Discharge status: Home / Self Care | Payer: BLUE CROSS/BLUE SHIELD | Attending: Emergency Medicine | Admitting: Emergency Medicine

## 2015-10-08 DIAGNOSIS — R739 Hyperglycemia, unspecified: Secondary | ICD-10-CM

## 2015-10-08 DIAGNOSIS — J45909 Unspecified asthma, uncomplicated: Secondary | ICD-10-CM | POA: Diagnosis not present

## 2015-10-08 DIAGNOSIS — R42 Dizziness and giddiness: Secondary | ICD-10-CM | POA: Insufficient documentation

## 2015-10-08 DIAGNOSIS — E785 Hyperlipidemia, unspecified: Secondary | ICD-10-CM | POA: Insufficient documentation

## 2015-10-08 DIAGNOSIS — E1165 Type 2 diabetes mellitus with hyperglycemia: Secondary | ICD-10-CM | POA: Diagnosis present

## 2015-10-08 LAB — CBC WITH DIFFERENTIAL/PLATELET
BASOS ABS: 0 10*3/uL (ref 0.0–0.1)
Basophils Relative: 0 %
Eosinophils Absolute: 0.1 10*3/uL (ref 0.0–0.7)
Eosinophils Relative: 2 %
HCT: 35.2 % — ABNORMAL LOW (ref 36.0–46.0)
Hemoglobin: 11.6 g/dL — ABNORMAL LOW (ref 12.0–15.0)
LYMPHS ABS: 2.2 10*3/uL (ref 0.7–4.0)
Lymphocytes Relative: 41 %
MCH: 29.5 pg (ref 26.0–34.0)
MCHC: 33 g/dL (ref 30.0–36.0)
MCV: 89.6 fL (ref 78.0–100.0)
MONOS PCT: 8 %
Monocytes Absolute: 0.4 10*3/uL (ref 0.1–1.0)
NEUTROS ABS: 2.5 10*3/uL (ref 1.7–7.7)
NEUTROS PCT: 49 %
PLATELETS: 218 10*3/uL (ref 150–400)
RBC: 3.93 MIL/uL (ref 3.87–5.11)
RDW: 13.2 % (ref 11.5–15.5)
WBC: 5.3 10*3/uL (ref 4.0–10.5)

## 2015-10-08 LAB — BASIC METABOLIC PANEL
ANION GAP: 8 (ref 5–15)
BUN: 12 mg/dL (ref 6–20)
CALCIUM: 8.5 mg/dL — AB (ref 8.9–10.3)
CO2: 26 mmol/L (ref 22–32)
Chloride: 105 mmol/L (ref 101–111)
Creatinine, Ser: 0.74 mg/dL (ref 0.44–1.00)
GFR calc Af Amer: >60 mL/min (ref >60)
GLUCOSE: 184 mg/dL — AB (ref 65–99)
Potassium: 3.6 mmol/L (ref 3.5–5.1)
SODIUM: 139 mmol/L (ref 135–145)

## 2015-10-08 LAB — URINE MICROSCOPIC-ADD ON
Bacteria, UA: NONE SEEN
WBC, UA: NONE SEEN WBC/hpf (ref 0–5)

## 2015-10-08 LAB — CBG MONITORING, ED: GLUCOSE-CAPILLARY: 134 mg/dL — AB (ref 65–99)

## 2015-10-08 LAB — URINALYSIS, ROUTINE W REFLEX MICROSCOPIC
Bilirubin Urine: NEGATIVE
Glucose, UA: NEGATIVE mg/dL
KETONES UR: NEGATIVE mg/dL
LEUKOCYTES UA: NEGATIVE
Nitrite: NEGATIVE
PH: 6 (ref 5.0–8.0)
Protein, ur: NEGATIVE mg/dL
Specific Gravity, Urine: 1.025 (ref 1.005–1.030)

## 2015-10-08 MED ORDER — MECLIZINE HCL 25 MG PO TABS: 25.0000 mg | ORAL_TABLET | Freq: Three times a day (TID) | ORAL | Status: DC | PRN

## 2015-10-08 NOTE — ED Notes (Signed)
MD at bedside. 

## 2015-10-08 NOTE — ED Provider Notes (Signed)
CSN: [id number]     Arrival date & time 10/08/15  0849 History   First MD Initiated Contact with Patient 10/08/15 [phone]     Chief Complaint  Patient presents with  . Hyperglycemia     (Consider location/radiation/quality/duration/timing/severity/associated sxs/prior Treatment) Patient is a 60 y.o. female presenting with hyperglycemia. The history is provided by the patient.  Hyperglycemia Blood sugar level PTA:  157 Severity:  Moderate Onset quality:  Gradual Duration:  2 days Timing:  Intermittent Progression:  Worsening Diabetes status:  Controlled with diet Context: not change in medication   Relieved by:  Nothing Ineffective treatments:  None tried Associated symptoms: no abdominal pain, no chest pain, no confusion, no dysuria, no shortness of breath and no weakness   Risk factors: no hx of DKA     Past Medical History  Diagnosis Date  . Asthma   . Seasonal allergies   . Diabetes mellitus without complication ([place]) 5-59-74    hx. NIDDM-dx. 3 weeks ago  . Ulcerative colitis ([place])   . Hyperlipidemia     hx   Past Surgical History  Procedure Laterality Date  . Cesarean section  1997/2006  . Colonoscopy w/ polypectomy    . Tonsillectomy    . Bunionectomy      bilateral and toe nails of big toes removed  . Cholecystectomy N/A 12/01/2012    Procedure: LAPAROSCOPIC CHOLECYSTECTOMY;  Surgeon: Stark Klein, MD;  Location: [facility];  Service: General;  Laterality: N/A;  . Eye surgery Bilateral 2013   Family History  Problem Relation Age of Onset  . Hypertension Mother   . Diabetes Mother   . Cancer Father     lung ca  . Seizures Daughter     42yr   Social History  Substance Use Topics  . Smoking status: Never Smoker   . Smokeless tobacco: Never Used  . Alcohol Use: 0.0 oz/week    0 Standard drinks or equivalent per week     Comment: occasional   OB History    No data available     Review of Systems  Constitutional: Negative for activity change.       All  ROS Neg except as noted in HPI  HENT: Negative for nosebleeds.   Eyes: Negative for photophobia and discharge.  Respiratory: Negative for cough, shortness of breath and wheezing.   Cardiovascular: Negative for chest pain and palpitations.  Gastrointestinal: Negative for abdominal pain and blood in stool.  Genitourinary: Negative for dysuria, frequency and hematuria.  Musculoskeletal: Negative for back pain, arthralgias and neck pain.  Skin: Negative.   Neurological: Positive for light-headedness. Negative for seizures, speech difficulty and weakness.  Psychiatric/Behavioral: Negative for hallucinations and confusion.      Allergies  Erythromycin; Penicillins; and Tetracycline  Home Medications   Prior to Admission medications   Medication Sig Start Date End Date Taking? Authorizing Provider  albuterol (PROVENTIL HFA;VENTOLIN HFA) 108 (90 BASE) MCG/ACT inhaler Inhale 2 puffs into the lungs every 6 (six) hours as needed for wheezing or shortness of breath. 04/16/15   RJearld Fenton NP  aspirin 81 MG tablet Take 81 mg by mouth daily.    Historical Provider, MD  glyBURIDE (DIABETA) 5 MG tablet Take 1 tablet (5 mg total) by mouth 2 (two) times daily with a meal. 08/29/14   RJearld Fenton NP  HYDROcodone-homatropine ([facility] 5-1.5 MG/5ML syrup Take 5 mLs by mouth every 8 (eight) hours as needed for cough. 04/16/15   RJearld Fenton NP  ibuprofen (ADVIL,MOTRIN) 400 MG tablet Take 1 tablet (400 mg total) by mouth every 6 (six) hours as needed. 10/26/13   [name], MD  methocarbamol (ROBAXIN) 500 MG tablet Take 1 tablet (500 mg total) by mouth at bedtime. 03/26/15   Jearld Fenton, NP  simvastatin (ZOCOR) 10 MG tablet Take 1 tablet (10 mg total) by mouth at bedtime. 08/08/14   Jearld Fenton, NP  traMADol (ULTRAM) 50 MG tablet Take 1 tablet (50 mg total) by mouth every 8 (eight) hours as needed. 03/26/15   Jearld Fenton, NP   There were no vitals taken for this visit. Physical Exam   Constitutional: She is oriented to person, place, and time. She appears well-developed and well-nourished.  Non-toxic appearance.  HENT:  Head: Normocephalic.  Right Ear: Tympanic membrane and external ear normal.  Left Ear: Tympanic membrane and external ear normal.  Eyes: EOM and lids are normal. Pupils are equal, round, and reactive to light.  Neck: Normal range of motion. Neck supple. Carotid bruit is not present.  Cardiovascular: Normal rate, regular rhythm, normal heart sounds, intact distal pulses and normal pulses.   Pulmonary/Chest: Breath sounds normal. No respiratory distress.  Abdominal: Soft. Bowel sounds are normal. There is no tenderness. There is no guarding.  Musculoskeletal: Normal range of motion.  Lymphadenopathy:       Head (right side): No submandibular adenopathy present.       Head (left side): No submandibular adenopathy present.    She has no cervical adenopathy.  Neurological: She is alert and oriented to person, place, and time. She has normal strength. No cranial nerve deficit or sensory deficit. She exhibits normal muscle tone.  Gait steady. Speech is clear.  Skin: Skin is warm and dry.  Psychiatric: She has a normal mood and affect. Her speech is normal.  Nursing note and vitals reviewed.   ED Course  Procedures (including critical care time) Labs Review Labs Reviewed - No data to display  Imaging Review No results found. I have personally reviewed and evaluated these images and lab results as part of my medical decision-making.   EKG Interpretation None      MDM Vital signs reviewed. Pulse ox 100% on room air, within normal limits by my interpretation. UA, CBC, Bmet non-acute Glucose 184. Pt given Rx for antivert. Pt encouraged to monitor glucose closely. Pt also encouraged to see PCP for possible reassessment of medications. Pt ambulatory at d/c without problem.   Final diagnoses:  None    **I have reviewed nursing notes, vital signs,  and all appropriate lab and imaging results for this patient.Lily Kocher, PA-C 10/08/15 2128  Ripley Fraise, MD 10/09/15 [phone]

## 2015-10-08 NOTE — ED Notes (Signed)
Lab at bedside

## 2015-10-08 NOTE — ED Notes (Signed)
Patient with c/o lightheadedness and hyperglycemia x 3 days. Type 2 DM that is diet controlled at this time. Normal sugars are in 90s-115 when checked at home. 134 in triage. Denies pain. H/o vertigo.

## 2015-10-08 NOTE — ED Notes (Signed)
[name] at bedside

## 2015-10-08 NOTE — ED Notes (Signed)
Pt states she stopped taking her glipizide medication 1 year ago and has been managing her DM with her diet. Pt denies any GI, GU, or respiratory problems.

## 2015-10-08 NOTE — Discharge Instructions (Signed)
Your glucose is mildly elevated at 184. Your electrolytes were well within normal limits. Your electrocardiogram shows no acute event. Please increase your fluids. Please increase your exercise as much as possible. Have your glucose rechecked by [name] soon. Use Antivert for dizziness and lightheadedness. This medication may cause drowsiness, please use with caution. Please see your primary physician, or return to the emergency department if not improving. Hyperglycemia High blood sugar (hyperglycemia) means that the level of sugar in your blood is higher than it should be. Signs of high blood sugar include:  Feeling thirsty.  Frequent peeing (urinating).  Feeling tired or sleepy.  Dry mouth.  Vision changes.  Feeling weak.  Feeling hungry but losing weight.  Numbness and tingling in your hands or feet.  Headache. When you ignore these signs, your blood sugar may keep going up. These problems may get worse, and other problems may begin. HOME CARE  Check your blood sugars as told by your doctor. Write down the numbers with the date and time.  Take the right amount of insulin or diabetes pills at the right time. Write down the dose with date and time.  Refill your insulin or diabetes pills before running out.  Watch what you eat. Follow your meal plan.  Drink liquids without sugar, such as water. Check with your doctor if you have kidney or heart disease.  Follow your doctor's orders for exercise. Exercise at the same time of day.  Keep your doctor's appointments. GET HELP RIGHT AWAY IF:   You have trouble thinking or are confused.  You have fast breathing with fruity smelling breath.  You pass out (faint).  You have 2 to 3 days of high blood sugars and you do not know why.  You have chest pain.  You are feeling sick to your stomach (nauseous) or throwing up (vomiting).  You have sudden vision changes. MAKE SURE YOU:   Understand these instructions.  Will  watch your condition.  Will get help right away if you are not doing well or get worse.   This information is not intended to replace advice given to you by your health care provider. Make sure you discuss any questions you have with your health care provider.   Document Released: 04/27/2009 Document Revised: 07/21/2014 Document Reviewed: 03/06/2015 Elsevier Interactive Patient Education 2016 Elsevier [employer]  Dizziness Dizziness is a common problem. It makes you feel unsteady or lightheaded. You may feel like you are about to pass out (faint). Dizziness can lead to injury if you stumble or fall. Anyone can get dizzy, but dizziness is more common in older adults. This condition can be caused by a number of things, including:  Medicines.  Dehydration.  Illness. HOME CARE Following these instructions may help with your condition: Eating and Drinking  Drink enough fluid to keep your pee (urine) clear or pale yellow. This helps to keep you from getting dehydrated. Try to drink more clear fluids, such as water.  Do not drink alcohol.  Limit how much caffeine you drink or eat if told by your doctor.  Limit how much salt you drink or eat if told by your doctor. Activity  Avoid making quick movements.  When you stand up from sitting in a chair, steady yourself until you feel okay.  In the morning, first sit up on the side of the bed. When you feel okay, stand slowly while you hold onto something. Do this until you know that your balance is fine.  Move  your legs often if you need to stand in one place for a long time. Tighten and relax your muscles in your legs while you are standing.  Do not drive or use heavy machinery if you feel dizzy.  Avoid bending down if you feel dizzy. Place items in your home so that they are easy for you to reach without leaning over. Lifestyle  Do not use any tobacco products, including cigarettes, chewing tobacco, or electronic cigarettes. If you need  help quitting, ask your doctor.  Try to lower your stress level, such as with yoga or meditation. Talk with your doctor if you need help. General Instructions  Watch your dizziness for any changes.  Take medicines only as told by your doctor. Talk with your doctor if you think that your dizziness is caused by a medicine that you are taking.  Tell a friend or a family member that you are feeling dizzy. If he or she notices any changes in your behavior, have this person call your doctor.  Keep all follow-up visits as told by your doctor. This is important. GET HELP IF:  Your dizziness does not go away.  Your dizziness or light-headedness gets worse.  You feel sick to your stomach (nauseous).  You have trouble hearing.  You have new symptoms.  You are unsteady on your feet or you feel like the room is spinning. GET HELP RIGHT AWAY IF:  You throw up (vomit) or have diarrhea and are unable to eat or drink anything.  You have trouble:  Talking.  Walking.  Swallowing.  Using your arms, hands, or legs.  You feel generally weak.  You are not thinking clearly or you have trouble forming sentences. It may take a friend or family member to notice this.  You have:  Chest pain.  Pain in your belly (abdomen).  Shortness of breath.  Sweating.  Your vision changes.  You are bleeding.  You have a headache.  You have neck pain or a stiff neck.  You have a fever.   This information is not intended to replace advice given to you by your health care provider. Make sure you discuss any questions you have with your health care provider.   Document Released: 06/19/2011 Document Revised: 11/14/2014 Document Reviewed: 06/26/2014 Elsevier Interactive Patient Education [employer].

## 2016-01-14 DIAGNOSIS — H2513 Age-related nuclear cataract, bilateral: Secondary | ICD-10-CM | POA: Diagnosis not present

## 2016-01-14 DIAGNOSIS — H3581 Retinal edema: Secondary | ICD-10-CM | POA: Diagnosis not present

## 2016-01-14 DIAGNOSIS — E119 Type 2 diabetes mellitus without complications: Secondary | ICD-10-CM | POA: Diagnosis not present

## 2016-01-14 DIAGNOSIS — H18613 Keratoconus, stable, bilateral: Secondary | ICD-10-CM | POA: Diagnosis not present

## 2016-01-18 DIAGNOSIS — E119 Type 2 diabetes mellitus without complications: Secondary | ICD-10-CM | POA: Diagnosis not present

## 2016-01-18 DIAGNOSIS — H33332 Multiple defects of retina without detachment, left eye: Secondary | ICD-10-CM | POA: Diagnosis not present

## 2016-01-18 LAB — HM DIABETES EYE EXAM

## 2016-01-21 ENCOUNTER — Encounter: Payer: Self-pay | Admitting: Internal Medicine

## 2016-02-20 ENCOUNTER — Ambulatory Visit (INDEPENDENT_AMBULATORY_CARE_PROVIDER_SITE_OTHER): Payer: BLUE CROSS/BLUE SHIELD | Admitting: Primary Care

## 2016-02-20 VITALS — BP 112/80 | HR 78 | Temp 98.3°F | Wt 151.0 lb

## 2016-02-20 DIAGNOSIS — R062 Wheezing: Secondary | ICD-10-CM | POA: Diagnosis not present

## 2016-02-20 DIAGNOSIS — R05 Cough: Secondary | ICD-10-CM

## 2016-02-20 DIAGNOSIS — R059 Cough, unspecified: Secondary | ICD-10-CM

## 2016-02-20 MED ORDER — PREDNISONE 20 MG PO TABS: ORAL_TABLET | ORAL | 0 refills | Status: DC

## 2016-02-20 MED ORDER — AZITHROMYCIN 250 MG PO TABS: ORAL_TABLET | ORAL | 0 refills | Status: DC

## 2016-02-20 MED ORDER — BENZONATATE 200 MG PO CAPS: 200.0000 mg | ORAL_CAPSULE | Freq: Three times a day (TID) | ORAL | 0 refills | Status: DC | PRN

## 2016-02-20 NOTE — Patient Instructions (Signed)
Start Azithromycin antibiotics. Take 2 tablets by mouth today, then 1 tablet daily for 4 additional days.  You may take Benzonatate capsules for cough. Take 1 capsule by mouth three times daily as needed for cough.  Start prednisone tablets. Take 2 tablets daily for 7 days until gone.  Ensure you are staying hydrated with water.  Please call if no improvement in 3-4 days.  It was a pleasure meeting you!

## 2016-02-20 NOTE — Progress Notes (Signed)
Subjective:    Patient ID: [name], female    DOB: 1956-07-07, 60 y.o.   MRN: [id number]  HPI  Ms. Bricco is a 60 year old female with a history of allergy induced asthma who presents today with a chief complaint of cough. She also reports shortness of breath, chest tightness, fatigue, wheezing. Her symptoms have been present for the past 2 weeks, but over the past 1 week her symptoms have progressed. She's using her albuterol inhaler every 4-6 hours daily. Her cough is non productive. She's also using Flonase with some improvement. She does not feel herself and cannot function at work as she talks on the phone daily.  Review of Systems  Constitutional: Positive for chills and fatigue. Negative for fever.  HENT: Positive for congestion. Negative for ear pain, postnasal drip and sore throat.   Respiratory: Positive for cough, shortness of breath and wheezing.   Cardiovascular: Negative for chest pain.  Gastrointestinal: Negative for nausea.       Past Medical History:  Diagnosis Date  . Asthma   . Diabetes mellitus without complication ([place]) 1-44-81   hx. NIDDM-dx. 3 weeks ago  . Hyperlipidemia    hx  . Seasonal allergies   . Ulcerative colitis [facility])      Social History   Social History  . Marital status: Legally Separated    Spouse name: N/A  . Number of children: N/A  . Years of education: N/A   Occupational History  . Not on file.   Social History Main Topics  . Smoking status: Never Smoker  . Smokeless tobacco: Never Used  . Alcohol use 0.0 oz/week     Comment: occasional  . Drug use: No  . Sexual activity: Not Currently   Other Topics Concern  . Not on file   Social History Narrative  . No narrative on file    Past Surgical History:  Procedure Laterality Date  . BUNIONECTOMY     bilateral and toe nails of big toes removed  . CESAREAN SECTION  1997/2006  . CHOLECYSTECTOMY N/A 12/01/2012   Procedure: LAPAROSCOPIC CHOLECYSTECTOMY;  Surgeon:  Stark Klein, MD;  Location: [facility];  Service: General;  Laterality: N/A;  . COLONOSCOPY W/ POLYPECTOMY    . EYE SURGERY Bilateral 2013  . TONSILLECTOMY      Family History  Problem Relation Age of Onset  . Hypertension Mother   . Diabetes Mother   . Cancer Father     lung ca  . Seizures Daughter     22yr    Allergies  Allergen Reactions  . Erythromycin Nausea And Vomiting  . Penicillins Nausea And Vomiting    Has patient had a PCN reaction causing immediate rash, facial/tongue/throat swelling, SOB or lightheadedness with hypotension: Yes Has patient had a PCN reaction causing severe rash involving mucus membranes or skin necrosis: No Has patient had a PCN reaction that required hospitalization No Has patient had a PCN reaction occurring within the last 10 years: No If all of the above answers are "NO", then may proceed with Cephalosporin use.   . Tetracycline Nausea And Vomiting    Current Outpatient Prescriptions on File Prior to Visit  Medication Sig Dispense Refill  . albuterol (PROVENTIL HFA;VENTOLIN HFA) 108 (90 BASE) MCG/ACT inhaler Inhale 2 puffs into the lungs every 6 (six) hours as needed for wheezing or shortness of breath. 1 Inhaler 0  . glyBURIDE (DIABETA) 5 MG tablet Take 1 tablet (5 mg total) by mouth 2 (two)  times daily with a meal. 180 tablet 0  . HYDROcodone-homatropine (HYCODAN) 5-1.5 MG/5ML syrup Take 5 mLs by mouth every 8 (eight) hours as needed for cough. 120 mL 0  . ibuprofen (ADVIL,MOTRIN) 400 MG tablet Take 1 tablet (400 mg total) by mouth every 6 (six) hours as needed. 30 tablet 0  . meclizine (ANTIVERT) 25 MG tablet Take 1 tablet (25 mg total) by mouth 3 (three) times daily as needed for dizziness. 20 tablet 0  . methocarbamol (ROBAXIN) 500 MG tablet Take 1 tablet (500 mg total) by mouth at bedtime. 30 tablet 0  . simvastatin (ZOCOR) 10 MG tablet Take 1 tablet (10 mg total) by mouth at bedtime. 30 tablet 2  . traMADol (ULTRAM) 50 MG tablet Take 1  tablet (50 mg total) by mouth every 8 (eight) hours as needed. 30 tablet 0   No current facility-administered medications on file prior to visit.     BP 112/80   Pulse 78   Temp 98.3 F (36.8 C)   Wt 151 lb (68.5 kg)   BMI 27.62 kg/m    Objective:   Physical Exam  Constitutional: She appears well-nourished. She appears ill.  HENT:  Right Ear: Tympanic membrane and ear canal normal.  Left Ear: Tympanic membrane and ear canal normal.  Nose: Right sinus exhibits no maxillary sinus tenderness and no frontal sinus tenderness. Left sinus exhibits no maxillary sinus tenderness and no frontal sinus tenderness.  Mouth/Throat: Oropharynx is clear and moist.  Eyes: Conjunctivae are normal.  Neck: Neck supple.  Cardiovascular: Normal rate and regular rhythm.   Pulmonary/Chest: Effort normal. She has wheezes in the right upper field and the left upper field. She has rhonchi.  Airway seems tight with difficulty and auscultation.  Lymphadenopathy:    She has no cervical adenopathy.  Skin: Skin is warm and dry.          Assessment & Plan:  Asthma exacerbation:  Cough, fatigue, wheezing, chest tightness for the past 2 weeks. Feeling worse over the past 1 week with increased cough and fatigue. Persistent cough present during examination, nonproductive. Lungs sounds difficult to auscultate as her airway is quite tight. She is speaking in complete sentences and is stable for outpatient treatment. Given duration of symptoms and examination, will treat for acute asthma exacerbation with antibiotics and prednisone. Z-Pak sent to pharmacy for which she can tolerate. Short prednisone burst sent to pharmacy.  Strict return precautions provided.  Sheral Flow, NP

## 2016-02-21 ENCOUNTER — Telehealth: Payer: Self-pay | Admitting: Internal Medicine

## 2016-02-21 ENCOUNTER — Encounter: Payer: Self-pay | Admitting: Primary Care

## 2016-02-21 ENCOUNTER — Ambulatory Visit (INDEPENDENT_AMBULATORY_CARE_PROVIDER_SITE_OTHER): Payer: BLUE CROSS/BLUE SHIELD | Admitting: Primary Care

## 2016-02-21 VITALS — BP 124/74 | HR 82 | Temp 98.1°F | Wt 151.8 lb

## 2016-02-21 DIAGNOSIS — R05 Cough: Secondary | ICD-10-CM | POA: Diagnosis not present

## 2016-02-21 DIAGNOSIS — R059 Cough, unspecified: Secondary | ICD-10-CM

## 2016-02-21 MED ORDER — HYDROCODONE-HOMATROPINE 5-1.5 MG/5ML PO SYRP: 5.0000 mL | ORAL_SOLUTION | Freq: Every evening | ORAL | 0 refills | Status: DC | PRN

## 2016-02-21 NOTE — Telephone Encounter (Signed)
Patient Name: [name]  DOB: Jun 01, 1956    Initial Comment Caller states she was seen yesterday, DX- Asthma. She's itching all over, chest pain, back pain. Muscle aches.   Nurse Assessment  Nurse: Mallie Mussel, RN, Alveta Heimlich Date/Time Eilene Ghazi Time): 02/21/2016 8:33:59 AM  Confirm and document reason for call. If symptomatic, describe symptoms. You must click the next button to save text entered. ---Caller states that she has chest pain which began last night after taking Tessalon, Prednisone, Erythromycin. She took the medication and chest pain began about 5-1/2 to 6 hours later. She states that its not real pain, its just sore muscles. When she takes a deep breath, the pain gets worse. She is itching all over also. She rates her itching as moderate. Denies fever. She has all over muscle aches.  Has the patient traveled out of the country within the last 30 days? ---No  Does the patient have any new or worsening symptoms? ---Yes  Will a triage be completed? ---Yes  Related visit to physician within the last 2 weeks? ---No  Does the PT have any chronic conditions? (i.e. diabetes, asthma, etc.) ---Yes  List chronic conditions. ---Asthma, Diabetes, Hypercholesterolemia, Benign Vertigo  Is this a behavioral health or substance abuse call? ---No     Guidelines    Guideline Title Affirmed Question Affirmed Notes  Chest Pain Taking a deep breath makes pain worse    Final Disposition User   Go to ED Now (or PCP triage) Mallie Mussel, RN, Alveta Heimlich    Comments  Caller states she normally sees Webb Silversmith NP. Rollene Fare did not have any appointments in the designated time frame that she could make. She lives about 1/2 hour to 45 minutes away. I was able to schedule her to be seen by Alma Friendly NP at 10:45am this morning.   Referrals  REFERRED TO PCP OFFICE   Disagree/Comply: Comply

## 2016-02-21 NOTE — Telephone Encounter (Signed)
Noted and evaluated.

## 2016-02-21 NOTE — Telephone Encounter (Signed)
Pt has appt 02/21/16 at 10:45 with Allie Bossier NP.

## 2016-02-21 NOTE — Progress Notes (Signed)
Pre visit review using our clinic review tool, if applicable. No additional management support is needed unless otherwise documented below in the visit note. 

## 2016-02-21 NOTE — Progress Notes (Signed)
Subjective:    Patient ID: [name], female    DOB: 1956/06/11, 60 y.o.   MRN: [id number]  HPI  [name] is a 60 year old female who presents today with a chief complaint of Itching. She also reports cough, chest congestion, and shortness of breath. She was evaluated yesterday for a chief complaint of cough, shortness of breath and was treated for an acute asthma exacerbation. She was treated with prednisone, benzonatate capsules, and Azithromycin. She had previously taken Azithromycin without difficulty in the past, but has a documented allergy to erythromycin.  Since her visit yesterday she developed headache, itching, and muscle aches that began yesterday afternoon around 5pm. She took the Prednisone, benzonatate capsules, and Azithromycin at about 10:30 that morning. She's not tried anything OTC for her itching. Denies throat closure, increased wheezing, increased shortness of breath.  Review of Systems  Constitutional: Negative for fatigue and fever.  HENT: Positive for congestion. Negative for sore throat and trouble swallowing.   Respiratory: Positive for cough, chest tightness, shortness of breath and wheezing.        Past Medical History:  Diagnosis Date  . Asthma   . Diabetes mellitus without complication ([place]) 3-78-58   hx. NIDDM-dx. 3 weeks ago  . Hyperlipidemia    hx  . Seasonal allergies   . Ulcerative colitis [facility])      Social History   Social History  . Marital status: Legally Separated    Spouse name: N/A  . Number of children: N/A  . Years of education: N/A   Occupational History  . Not on file.   Social History Main Topics  . Smoking status: Never Smoker  . Smokeless tobacco: Never Used  . Alcohol use 0.0 oz/week     Comment: occasional  . Drug use: No  . Sexual activity: Not Currently   Other Topics Concern  . Not on file   Social History Narrative  . No narrative on file    Past Surgical History:  Procedure Laterality Date    . BUNIONECTOMY     bilateral and toe nails of big toes removed  . CESAREAN SECTION  1997/2006  . CHOLECYSTECTOMY N/A 12/01/2012   Procedure: LAPAROSCOPIC CHOLECYSTECTOMY;  Surgeon: Stark Klein, MD;  Location: [facility];  Service: General;  Laterality: N/A;  . COLONOSCOPY W/ POLYPECTOMY    . EYE SURGERY Bilateral 2013  . TONSILLECTOMY      Family History  Problem Relation Age of Onset  . Hypertension Mother   . Diabetes Mother   . Cancer Father     lung ca  . Seizures Daughter     51yr    Allergies  Allergen Reactions  . Azithromycin Itching    Muscle aches  . Erythromycin Nausea And Vomiting  . Penicillins Nausea And Vomiting    Has patient had a PCN reaction causing immediate rash, facial/tongue/throat swelling, SOB or lightheadedness with hypotension: Yes Has patient had a PCN reaction causing severe rash involving mucus membranes or skin necrosis: No Has patient had a PCN reaction that required hospitalization No Has patient had a PCN reaction occurring within the last 10 years: No If all of the above answers are "NO", then may proceed with Cephalosporin use.   . Tetracycline Nausea And Vomiting    Current Outpatient Prescriptions on File Prior to Visit  Medication Sig Dispense Refill  . albuterol (PROVENTIL HFA;VENTOLIN HFA) 108 (90 BASE) MCG/ACT inhaler Inhale 2 puffs into the lungs every 6 (six) hours as needed  for wheezing or shortness of breath. 1 Inhaler 0  . azithromycin (ZITHROMAX) 250 MG tablet Take 2 tablets by mouth today, then 1 tablet daily for 4 additional days. 6 tablet 0  . benzonatate (TESSALON) 200 MG capsule Take 1 capsule (200 mg total) by mouth 3 (three) times daily as needed. 30 capsule 0  . glyBURIDE (DIABETA) 5 MG tablet Take 1 tablet (5 mg total) by mouth 2 (two) times daily with a meal. 180 tablet 0  . ibuprofen (ADVIL,MOTRIN) 400 MG tablet Take 1 tablet (400 mg total) by mouth every 6 (six) hours as needed. 30 tablet 0  . meclizine (ANTIVERT) 25  MG tablet Take 1 tablet (25 mg total) by mouth 3 (three) times daily as needed for dizziness. 20 tablet 0  . methocarbamol (ROBAXIN) 500 MG tablet Take 1 tablet (500 mg total) by mouth at bedtime. 30 tablet 0  . predniSONE (DELTASONE) 20 MG tablet Take 2 tablets by mouth once daily for 7 days. 14 tablet 0  . simvastatin (ZOCOR) 10 MG tablet Take 1 tablet (10 mg total) by mouth at bedtime. 30 tablet 2  . traMADol (ULTRAM) 50 MG tablet Take 1 tablet (50 mg total) by mouth every 8 (eight) hours as needed. 30 tablet 0   No current facility-administered medications on file prior to visit.     BP 124/74   Pulse 82   Temp 98.1 F (36.7 C) (Oral)   Wt 151 lb 12.8 oz (68.9 kg)   BMI 27.76 kg/m    Objective:   Physical Exam  Constitutional: She appears well-nourished.  HENT:  Right Ear: Tympanic membrane and ear canal normal.  Left Ear: Tympanic membrane and ear canal normal.  Nose: Right sinus exhibits no maxillary sinus tenderness and no frontal sinus tenderness. Left sinus exhibits no maxillary sinus tenderness and no frontal sinus tenderness.  Mouth/Throat: Oropharynx is clear and moist.  Eyes: Conjunctivae are normal.  Neck: Neck supple.  Cardiovascular: Normal rate and regular rhythm.   Pulmonary/Chest: Effort normal and breath sounds normal.  Tight airway overall without wheezing. Actually looks better today than yesterday.  Lymphadenopathy:    She has no cervical adenopathy.  Skin: Skin is warm and dry.          Assessment & Plan:  Allergic reaction:  Complaints of itching, shortness of breath, muscle aches since 5 PM yesterday after taking azithromycin, prednisone, benzonatate capsules. Unsure of which medication caused symptoms but suspect azithromycin given history of erythromycin allergy. We'll have her stop azithromycin, continue prednisone and benzonatate capsules. Start Benadryl capsules at home for itching and symptoms. Prescription provided for nighttime cough per  patient request. She does not appear in acute distress, vital stable, speaking in complete sentences. Strict return precautions provided. She is stable for outpatient management at this time.  Sheral Flow, NP

## 2016-02-21 NOTE — Patient Instructions (Signed)
Stop taking Azithromycin tablets.  Continue Prednisone as prescribed.  Continue Benzonatate capsules as prescribed.  You may take the Hycodan cough suppressant at bedtime as needed for cough and rest. Caution this medication contains codeine and will make you feel drowsy.  Go home and take Benadryl medication for your itching. This may cause drowsiness.   Please call us tomorrow if no improvement in itching.  It was a pleasure to see you today!

## 2016-02-26 ENCOUNTER — Ambulatory Visit (INDEPENDENT_AMBULATORY_CARE_PROVIDER_SITE_OTHER)
Admission: RE | Admit: 2016-02-26 | Discharge: 2016-02-26 | Disposition: A | Discharge status: Home / Self Care | Payer: BLUE CROSS/BLUE SHIELD | Source: Ambulatory Visit | Attending: Internal Medicine | Admitting: Internal Medicine

## 2016-02-26 ENCOUNTER — Ambulatory Visit (INDEPENDENT_AMBULATORY_CARE_PROVIDER_SITE_OTHER): Payer: BLUE CROSS/BLUE SHIELD | Admitting: Internal Medicine

## 2016-02-26 ENCOUNTER — Encounter: Payer: Self-pay | Admitting: Internal Medicine

## 2016-02-26 VITALS — BP 116/68 | HR 86 | Temp 98.2°F | Wt 150.0 lb

## 2016-02-26 DIAGNOSIS — R05 Cough: Secondary | ICD-10-CM

## 2016-02-26 DIAGNOSIS — R059 Cough, unspecified: Secondary | ICD-10-CM

## 2016-02-26 DIAGNOSIS — J4521 Mild intermittent asthma with (acute) exacerbation: Secondary | ICD-10-CM | POA: Diagnosis not present

## 2016-02-26 MED ORDER — ALBUTEROL SULFATE HFA 108 (90 BASE) MCG/ACT IN AERS: 2.0000 | INHALATION_SPRAY | Freq: Four times a day (QID) | RESPIRATORY_TRACT | 0 refills | Status: DC | PRN

## 2016-02-26 NOTE — Progress Notes (Signed)
Pre visit review using our clinic review tool, if applicable. No additional management support is needed unless otherwise documented below in the visit note. 

## 2016-02-26 NOTE — Progress Notes (Signed)
HPI  Pt presents to the clinic today with c/o ongoing sneezing, cough and chest tightness. She was seen 8/9 for the same, diagnosed with asthma exacerbation. She was treated with Prednisone, Azithromax and Benzonate. She returned 8/10 c/o itching all over, without a rash. She was taken off the Azithromax and Benzonate and given Hycodan for cough. She reports cough and chest congestion. The cough is non productive. She denies shortness of breath. She denies runny nose, nasal congestion, ear pain, or sore throat. She has 1 day left on the Prednisone and reports she has not had any improvement. She denies fever, chills or body aches.  Review of Systems      Past Medical History:  Diagnosis Date  . Asthma   . Diabetes mellitus without complication ([place]) 2-29-79   hx. NIDDM-dx. 3 weeks ago  . Hyperlipidemia    hx  . Seasonal allergies   . Ulcerative colitis ([place])     Family History  Problem Relation Age of Onset  . Hypertension Mother   . Diabetes Mother   . Cancer Father     lung ca  . Seizures Daughter     24yr    Social History   Social History  . Marital status: Legally Separated    Spouse name: N/A  . Number of children: N/A  . Years of education: N/A   Occupational History  . Not on file.   Social History Main Topics  . Smoking status: Never Smoker  . Smokeless tobacco: Never Used  . Alcohol use 0.0 oz/week     Comment: occasional  . Drug use: No  . Sexual activity: Not Currently   Other Topics Concern  . Not on file   Social History Narrative  . No narrative on file    Allergies  Allergen Reactions  . Azithromycin Itching    Muscle aches  . Erythromycin Nausea And Vomiting  . Penicillins Nausea And Vomiting    Has patient had a PCN reaction causing immediate rash, facial/tongue/throat swelling, SOB or lightheadedness with hypotension: Yes Has patient had a PCN reaction causing severe rash involving mucus membranes or skin necrosis: No Has patient had a  PCN reaction that required hospitalization No Has patient had a PCN reaction occurring within the last 10 years: No If all of the above answers are "NO", then may proceed with Cephalosporin use.   . Tetracycline Nausea And Vomiting     Constitutional: Positive headache. Denies fatigue, fever or abrupt weight changes.  HEENT:  Positive sneezing Denies eye redness, eye pain, pressure behind the eyes, facial pain, nasal congestion, ear pain, ringing in the ears, wax buildup, runny nose or bloody nose. Respiratory: Positive cough. Denies difficulty breathing or shortness of breath.  Cardiovascular: Positive chest tightness. Denies chest pain, palpitations or swelling in the hands or feet.   No other specific complaints in a complete review of systems (except as listed in HPI above).  Objective:   BP 116/68   Pulse 86   Temp 98.2 F (36.8 C) (Oral)   Wt 150 lb (68 kg)   BMI 27.44 kg/m  Wt Readings from Last 3 Encounters:  02/26/16 150 lb (68 kg)  02/21/16 151 lb 12.8 oz (68.9 kg)  02/20/16 151 lb (68.5 kg)     General: Appears herstated age, in NAD. HEENT: Head: normal shape and size, no sinus tenderness noted; Eyes: sclera white, no icterus, conjunctiva pink; Ears: Tm's gray and intact, normal light reflex; Throat/Mouth: Teeth present, mucosa pink and  moist, no exudate noted, no lesions or ulcerations noted.  Neck: No cervical lymphadenopathy.  Cardiovascular: Normal rate and rhythm. S1,S2 noted.  No murmur, rubs or gallops noted.  Pulmonary/Chest: Normal effort and positive vesicular breath sounds. ? Fine crackles noted in the RLL.  No respiratory distress. No wheezes, or ronchi noted.      Assessment & Plan:   Cough:  Will check chest xray to r/o underlying pneumonia If + will start Levaquin (no doxy cause she has issues with diarrhea secondary to UC) Finish out course of Prednisone Albuterol inhaler refilled Continue Hycodan for cough Work note provided  RTC as needed  or if symptoms persist.   Webb Silversmith, NP

## 2016-02-26 NOTE — Patient Instructions (Signed)

## 2016-02-27 ENCOUNTER — Telehealth: Payer: Self-pay | Admitting: Internal Medicine

## 2016-02-27 NOTE — Telephone Encounter (Signed)
Pt returned call about xray . Please call back at [phone] Thanks

## 2016-06-19 ENCOUNTER — Telehealth: Payer: Self-pay | Admitting: Internal Medicine

## 2016-06-19 NOTE — Telephone Encounter (Signed)
Can you call and check on her

## 2016-06-19 NOTE — Telephone Encounter (Signed)
[facility] Patient Name: BEVERLY FERNER DOB: 12/13/1955 Initial Comment Caller's BS is 250 and just feels light headed, dizzy, hard to concentrate. Nurse Assessment Nurse: Andria Frames, RN, Aeriel Date/Time Eilene Ghazi Time): 06/19/2016 12:22:32 PM Confirm and document reason for call. If symptomatic, describe symptoms. ---Caller states, her blood sugar was 250 an hour ago. She does not use insulin. Caller states, she is not having a headache. She is lightheaded. It is kind of hard to focus. Does the patient have any new or worsening symptoms? ---Yes Will a triage be completed? ---Yes Related visit to physician within the last 2 weeks? ---No Does the PT have any chronic conditions? (i.e. diabetes, asthma, etc.) ---Yes List chronic conditions. ---diabetes, Ulcerative Colitis Is this a behavioral health or substance abuse call? ---No Guidelines Guideline Title Affirmed Question Affirmed Notes Diabetes - High Blood Sugar Patient sounds very sick or weak to the triager Final Disposition User Go to ED Now Hensel, RN, Aeriel Comments Caller is not currenlty confused but has had periods of confusion earlier today. Caller is having some dizziness. Pt is going to [facility]. Nurse also advised caller if she becomes any worse to call 911 instead. Referrals GO TO FACILITY OTHER - SPECIFY Disagree/Comply: Comply

## 2016-06-19 NOTE — Telephone Encounter (Signed)
Pt reports her BG was 250 this morning fasting and pt reports she felt "off)---pt states she ate peanut butter crackers and 1-2 hours later her BG was 150---pt reports while at work she seemed confused while dealing with customers and phone calls, so her boss told her to leave for the rest of the day- Pt reports she got home and ate something, she then fell asleep and rgiht now she feels a lot better, but not 100%--being that she is feeling better, I have scheduled pt for an appt tomorrow at 3pm for f/u as her last A1C is from 10/2014... Please advise

## 2016-06-20 NOTE — Telephone Encounter (Signed)
Note, we will address at appt

## 2016-07-17 ENCOUNTER — Ambulatory Visit: Payer: Self-pay | Admitting: Family Medicine

## 2016-07-17 ENCOUNTER — Encounter: Payer: Self-pay | Admitting: Primary Care

## 2016-07-17 ENCOUNTER — Ambulatory Visit (INDEPENDENT_AMBULATORY_CARE_PROVIDER_SITE_OTHER): Payer: BLUE CROSS/BLUE SHIELD | Admitting: Primary Care

## 2016-07-17 ENCOUNTER — Other Ambulatory Visit: Payer: Self-pay | Admitting: Primary Care

## 2016-07-17 VITALS — BP 122/80 | HR 80 | Temp 98.0°F | Wt 150.8 lb

## 2016-07-17 DIAGNOSIS — E119 Type 2 diabetes mellitus without complications: Secondary | ICD-10-CM

## 2016-07-17 DIAGNOSIS — R51 Headache: Secondary | ICD-10-CM

## 2016-07-17 DIAGNOSIS — R519 Headache, unspecified: Secondary | ICD-10-CM

## 2016-07-17 LAB — HEMOGLOBIN A1C: HEMOGLOBIN A1C: 8.5 % — AB (ref 4.6–6.5)

## 2016-07-17 MED ORDER — KETOROLAC TROMETHAMINE 60 MG/2ML IM SOLN
60.0000 mg | Freq: Once | INTRAMUSCULAR | Status: AC
  Administered 2016-07-17: 60 mg via INTRAMUSCULAR

## 2016-07-17 MED ORDER — GLIPIZIDE 5 MG PO TABS: 5.0000 mg | ORAL_TABLET | Freq: Two times a day (BID) | ORAL | 0 refills | Status: DC

## 2016-07-17 NOTE — Patient Instructions (Signed)
You were provided with an injection of Toradol for your migraine. Please notify us if no improvement in 24 hours. Do not take any iburpofen, Motrin, Advil, Aleve, naproxen today.  Your migraine could have been triggered by your higher blood sugars. Complete lab work prior to leaving today. I will notify you of your results once received.   Continue to work on reducing sweets, potatoes, sodas.  Start exercising. You should be getting 150 minutes of moderate intensity exercise weekly.  Ensure you are consuming 64 ounces of water daily.  It was a pleasure to see you today!  Diabetes Mellitus and Food It is important for you to manage your blood sugar (glucose) level. Your blood glucose level can be greatly affected by what you eat. Eating healthier foods in the appropriate amounts throughout the day at about the same time each day will help you control your blood glucose level. It can also help slow or prevent worsening of your diabetes mellitus. Healthy eating may even help you improve the level of your blood pressure and reach or maintain a healthy weight. General recommendations for healthful eating and cooking habits include:  Eating meals and snacks regularly. Avoid going long periods of time without eating to lose weight.  Eating a diet that consists mainly of plant-based foods, such as fruits, vegetables, nuts, legumes, and whole grains.  Using low-heat cooking methods, such as baking, instead of high-heat cooking methods, such as deep frying. Work with your dietitian to make sure you understand how to use the Nutrition Facts information on food labels. How can food affect me? Carbohydrates  Carbohydrates affect your blood glucose level more than any other type of food. Your dietitian will help you determine how many carbohydrates to eat at each meal and teach you how to count carbohydrates. Counting carbohydrates is important to keep your blood glucose at a healthy level, especially if  you are using insulin or taking certain medicines for diabetes mellitus. Alcohol  Alcohol can cause sudden decreases in blood glucose (hypoglycemia), especially if you use insulin or take certain medicines for diabetes mellitus. Hypoglycemia can be a life-threatening condition. Symptoms of hypoglycemia (sleepiness, dizziness, and disorientation) are similar to symptoms of having too much alcohol. If your health care provider has given you approval to drink alcohol, do so in moderation and use the following guidelines:  Women should not have more than one drink per day, and men should not have more than two drinks per day. One drink is equal to:  12 oz of beer.  5 oz of wine.  1 oz of hard liquor.  Do not drink on an empty stomach.  Keep yourself hydrated. Have water, diet soda, or unsweetened iced tea.  Regular soda, juice, and other mixers might contain a lot of carbohydrates and should be counted. What foods are not recommended? As you make food choices, it is important to remember that all foods are not the same. Some foods have fewer nutrients per serving than other foods, even though they might have the same number of calories or carbohydrates. It is difficult to get your body what it needs when you eat foods with fewer nutrients. Examples of foods that you should avoid that are high in calories and carbohydrates but low in nutrients include:  Trans fats (most processed foods list trans fats on the Nutrition Facts label).  Regular soda.  Juice.  Candy.  Sweets, such as cake, pie, doughnuts, and cookies.  Fried foods. What foods can I eat?  Eat nutrient-rich foods, which will nourish your body and keep you healthy. The food you should eat also will depend on several factors, including:  The calories you need.  The medicines you take.  Your weight.  Your blood glucose level.  Your blood pressure level.  Your cholesterol level. You should eat a variety of foods,  including:  Protein.  Lean cuts of meat.  Proteins low in saturated fats, such as fish, egg whites, and beans. Avoid processed meats.  Fruits and vegetables.  Fruits and vegetables that may help control blood glucose levels, such as apples, mangoes, and yams.  Dairy products.  Choose fat-free or low-fat dairy products, such as milk, yogurt, and cheese.  Grains, bread, pasta, and rice.  Choose whole grain products, such as multigrain bread, whole oats, and brown rice. These foods may help control blood pressure.  Fats.  Foods containing healthful fats, such as nuts, avocado, olive oil, canola oil, and fish. Does everyone with diabetes mellitus have the same meal plan? Because every person with diabetes mellitus is different, there is not one meal plan that works for everyone. It is very important that you meet with a dietitian who will help you create a meal plan that is just right for you. This information is not intended to replace advice given to you by your health care provider. Make sure you discuss any questions you have with your health care provider. Document Released: 03/27/2005 Document Revised: 12/06/2015 Document Reviewed: 05/27/2013 Elsevier Interactive Patient Education  2017 [employer].

## 2016-07-17 NOTE — Assessment & Plan Note (Signed)
Hyperglycemia noted over the last several months. Check A1C today. Discussed diabetic diet and need for regular exercise.

## 2016-07-17 NOTE — Progress Notes (Signed)
Pre visit review using our clinic review tool, if applicable. No additional management support is needed unless otherwise documented below in the visit note. 

## 2016-07-17 NOTE — Addendum Note (Signed)
Addended by: Jacqualin Combes on: 07/17/2016 11:54 AM   Modules accepted: Orders

## 2016-07-17 NOTE — Progress Notes (Signed)
Subjective:    Patient ID: [name], female    DOB: 1955-10-19, 61 y.o.   MRN: [id number]  HPI  Mr. Peifer is a 61 year old female with a history of migraines and diet controlled type 2 diabetes who presents today with a chief complaint of headache. Her headache is located to the lower occipital region with radiation to all over her head. Her headaches have been present intermittently for the past 1 month. Her headaches will last 2-3 days at a time on average. She will experience symptoms of photophobia, phonophobia, and nausea during her headaches. She denies increased stress, but has noticed some elevation in her blood sugars. She's taken tylenol and ibuprofen without improvement. Her headache today is dull, but she's afraid it will escalate.  She is a type 2 diabetic, controlled on diet alone. She's checking her blood sugars 2-3 times weekly. Her fasting sugars are ranging 150-200 and her afternoon sugars are 130's. She does have intermittent numbness/tingling to her hands and feet which has been ongoing. She's trying to work on improvements in her diet but she's still drinking sodas. She's reduced her amount of desserts, breads, and potatoes. She is not exercising.  Review of Systems  Eyes: Positive for photophobia.  Respiratory: Negative for shortness of breath.   Cardiovascular: Negative for chest pain.  Gastrointestinal: Positive for nausea.  Neurological: Positive for numbness and headaches. Negative for dizziness.       Past Medical History:  Diagnosis Date  . Asthma   . Diabetes mellitus without complication ([place]) 8-50-27   hx. NIDDM-dx. 3 weeks ago  . Hyperlipidemia    hx  . Seasonal allergies   . Ulcerative colitis [facility])      Social History   Social History  . Marital status: Legally Separated    Spouse name: N/A  . Number of children: N/A  . Years of education: N/A   Occupational History  . Not on file.   Social History Main Topics  . Smoking  status: Never Smoker  . Smokeless tobacco: Never Used  . Alcohol use 0.0 oz/week     Comment: occasional  . Drug use: No  . Sexual activity: Not Currently   Other Topics Concern  . Not on file   Social History Narrative  . No narrative on file    Past Surgical History:  Procedure Laterality Date  . BUNIONECTOMY     bilateral and toe nails of big toes removed  . CESAREAN SECTION  1997/2006  . CHOLECYSTECTOMY N/A 12/01/2012   Procedure: LAPAROSCOPIC CHOLECYSTECTOMY;  Surgeon: Stark Klein, MD;  Location: [facility];  Service: General;  Laterality: N/A;  . COLONOSCOPY W/ POLYPECTOMY    . EYE SURGERY Bilateral 2013  . TONSILLECTOMY      Family History  Problem Relation Age of Onset  . Hypertension Mother   . Diabetes Mother   . Cancer Father     lung ca  . Seizures Daughter     75yr    Allergies  Allergen Reactions  . Azithromycin Itching    Muscle aches  . Erythromycin Nausea And Vomiting  . Penicillins Nausea And Vomiting    Has patient had a PCN reaction causing immediate rash, facial/tongue/throat swelling, SOB or lightheadedness with hypotension: Yes Has patient had a PCN reaction causing severe rash involving mucus membranes or skin necrosis: No Has patient had a PCN reaction that required hospitalization No Has patient had a PCN reaction occurring within the last 10 years: No If  all of the above answers are "NO", then may proceed with Cephalosporin use.   . Tetracycline Nausea And Vomiting    Current Outpatient Prescriptions on File Prior to Visit  Medication Sig Dispense Refill  . albuterol (PROVENTIL HFA;VENTOLIN HFA) 108 (90 Base) MCG/ACT inhaler Inhale 2 puffs into the lungs every 6 (six) hours as needed for wheezing or shortness of breath. 1 Inhaler 0  . ibuprofen (ADVIL,MOTRIN) 400 MG tablet Take 1 tablet (400 mg total) by mouth every 6 (six) hours as needed. 30 tablet 0  . meclizine (ANTIVERT) 25 MG tablet Take 1 tablet (25 mg total) by mouth 3 (three)  times daily as needed for dizziness. (Patient not taking: Reported on 07/17/2016) 20 tablet 0   No current facility-administered medications on file prior to visit.     BP 122/80   Pulse 80   Temp 98 F (36.7 C) (Oral)   Wt 150 lb 12.8 oz (68.4 kg)   SpO2 99%   BMI 27.58 kg/m    Objective:   Physical Exam  Constitutional: She is oriented to person, place, and time. She appears well-nourished.  No acute distress  Eyes: EOM are normal. Pupils are equal, round, and reactive to light.  Neck: Neck supple.  Cardiovascular: Normal rate and regular rhythm.   Pulmonary/Chest: Effort normal and breath sounds normal.  Neurological: She is alert and oriented to person, place, and time. No cranial nerve deficit.  No acute distress  Skin: Skin is warm and dry.          Assessment & Plan:  Migraine:  Intermittent for the past 1 month, lasting 2-3 days. History of migraines which are overall infrequent. Exam today unremarkable, she is in no acute distress. IM Toradol provided today, normal BMP. Discussed to avoid NSAIDs and to notify us if no improvement in 24 hours. Check A1C as hyperglycemia could be trigger. Discussed healthy diet and regular exercise.  Sheral Flow, NP

## 2016-07-18 ENCOUNTER — Telehealth: Payer: Self-pay | Admitting: Internal Medicine

## 2016-07-18 NOTE — Telephone Encounter (Signed)
Patient returned Chan's call.  Patient's at work.  Patient is requesting you  leave a detailed message on her voice mail.

## 2016-07-18 NOTE — Telephone Encounter (Signed)
Spoken and notified patient of Kate's comments. Patient verbalized understanding. 

## 2016-07-26 DIAGNOSIS — H18613 Keratoconus, stable, bilateral: Secondary | ICD-10-CM | POA: Diagnosis not present

## 2016-07-26 DIAGNOSIS — H16143 Punctate keratitis, bilateral: Secondary | ICD-10-CM | POA: Diagnosis not present

## 2016-10-21 ENCOUNTER — Telehealth: Payer: Self-pay | Admitting: *Deleted

## 2016-10-21 DIAGNOSIS — E119 Type 2 diabetes mellitus without complications: Secondary | ICD-10-CM

## 2016-10-21 MED ORDER — GLIPIZIDE 5 MG PO TABS: 5.0000 mg | ORAL_TABLET | Freq: Two times a day (BID) | ORAL | 0 refills | Status: DC

## 2016-10-21 NOTE — Telephone Encounter (Signed)
Pt requesting medication refill.  Last labs indicate pt was to f/u in 45mo with PCP. F/u scheduled and Rx sent,

## 2016-10-27 ENCOUNTER — Ambulatory Visit: Payer: BLUE CROSS/BLUE SHIELD | Admitting: Internal Medicine

## 2016-10-27 ENCOUNTER — Telehealth: Payer: Self-pay | Admitting: Internal Medicine

## 2016-10-27 NOTE — Telephone Encounter (Signed)
Yes, charge the NSF and she does need to follow up

## 2016-10-27 NOTE — Telephone Encounter (Signed)
Patient did not come in for their appointment today for 3 mo follow up. Please let me know if patient needs to be contacted immediately for follow up or no follow up needed. Do you want to charge the NSF?

## 2016-11-12 ENCOUNTER — Encounter: Payer: Self-pay | Admitting: Internal Medicine

## 2016-11-12 NOTE — Telephone Encounter (Signed)
Sent letter to pt to reschedule appt

## 2017-02-25 ENCOUNTER — Emergency Department
Admission: EM | Admit: 2017-02-25 | Discharge: 2017-02-25 | Disposition: A | Discharge status: Home / Self Care | Payer: BLUE CROSS/BLUE SHIELD | Attending: Student in an Organized Health Care Education/Training Program | Admitting: Student in an Organized Health Care Education/Training Program

## 2017-02-25 ENCOUNTER — Encounter: Payer: Self-pay | Admitting: Emergency Medicine

## 2017-02-25 ENCOUNTER — Emergency Department: Payer: BLUE CROSS/BLUE SHIELD

## 2017-02-25 DIAGNOSIS — G44209 Tension-type headache, unspecified, not intractable: Secondary | ICD-10-CM | POA: Insufficient documentation

## 2017-02-25 DIAGNOSIS — R519 Headache, unspecified: Secondary | ICD-10-CM

## 2017-02-25 DIAGNOSIS — Z794 Long term (current) use of insulin: Secondary | ICD-10-CM | POA: Insufficient documentation

## 2017-02-25 DIAGNOSIS — E119 Type 2 diabetes mellitus without complications: Secondary | ICD-10-CM | POA: Insufficient documentation

## 2017-02-25 DIAGNOSIS — R51 Headache: Secondary | ICD-10-CM | POA: Diagnosis not present

## 2017-02-25 DIAGNOSIS — J45909 Unspecified asthma, uncomplicated: Secondary | ICD-10-CM | POA: Diagnosis not present

## 2017-02-25 MED ORDER — ACETAMINOPHEN 325 MG PO TABS
650.0000 mg | ORAL_TABLET | Freq: Once | ORAL | Status: AC
  Administered 2017-02-25: 650 mg via ORAL
  Filled 2017-02-25: qty 2

## 2017-02-25 MED ORDER — DEXAMETHASONE SODIUM PHOSPHATE 10 MG/ML IJ SOLN
10.0000 mg | Freq: Once | INTRAMUSCULAR | Status: AC
  Administered 2017-02-25: 10 mg via INTRAVENOUS
  Filled 2017-02-25: qty 1

## 2017-02-25 MED ORDER — PROCHLORPERAZINE EDISYLATE 5 MG/ML IJ SOLN
10.0000 mg | Freq: Once | INTRAMUSCULAR | Status: AC
  Administered 2017-02-25: 10 mg via INTRAVENOUS
  Filled 2017-02-25: qty 2

## 2017-02-25 NOTE — ED Provider Notes (Signed)
[facility] Emergency Department Provider Note    First MD Initiated Contact with Patient 02/25/17 1431     (approximate)  I have reviewed the triage vital signs and the nursing notes.   HISTORY  Chief Complaint Migraine    HPI [name] is a 61 y.o. female history of migraine headaches presents with a chief complaint of headache that was gradual in onset around 9 AM this morning. Patient states that last headache that was similar in severity was last week. Has not taken anything for the headache. No fevers. No neck stiffness. No numbness or tingling. Patient states that she has presented to the ER for similar discomfort and had a migraine cocktail which resolved her discomfort. Denies any trauma. No thunderclap headache. Didn't endorse blurry vision which has since resolved. Does have photophobia.   Past Medical History:  Diagnosis Date  . Asthma   . Diabetes mellitus without complication ([place]) 1-95-09   hx. NIDDM-dx. 3 weeks ago  . Hyperlipidemia    hx  . Seasonal allergies   . Ulcerative colitis ([place])    Family History  Problem Relation Age of Onset  . Hypertension Mother   . Diabetes Mother   . Cancer Father        lung ca  . Seizures Daughter        77yr   Past Surgical History:  Procedure Laterality Date  . BUNIONECTOMY     bilateral and toe nails of big toes removed  . CESAREAN SECTION  1997/2006  . CHOLECYSTECTOMY N/A 12/01/2012   Procedure: LAPAROSCOPIC CHOLECYSTECTOMY;  Surgeon: FStark Klein MD;  Location: [facility]S;  Service: General;  Laterality: N/A;  . COLONOSCOPY W/ POLYPECTOMY    . EYE SURGERY Bilateral 2013  . TONSILLECTOMY     Patient Active Problem List   Diagnosis Date Noted  . HLD (hyperlipidemia) 08/02/2014  . Type 2 diabetes mellitus ([place] 07/31/2014  . ULCERATIVE COLITIS 06/26/2008  . Chronic cholecystitis with calculus 06/26/2008  . Allergy-induced asthma 03/09/2008      Prior to Admission  medications   Medication Sig Start Date End Date Taking? Authorizing Provider  albuterol (PROVENTIL HFA;VENTOLIN HFA) 108 (90 Base) MCG/ACT inhaler Inhale 2 puffs into the lungs every 6 (six) hours as needed for wheezing or shortness of breath. 02/26/16   BJearld Fenton NP  glipiZIDE (GLUCOTROL) 5 MG tablet Take 1 tablet (5 mg total) by mouth 2 (two) times daily before a meal. 10/21/16   Baity, RCoralie Keens NP  ibuprofen (ADVIL,MOTRIN) 400 MG tablet Take 1 tablet (400 mg total) by mouth every 6 (six) hours as needed. 10/26/13   Horton, CBarbette Hair MD  meclizine (ANTIVERT) 25 MG tablet Take 1 tablet (25 mg total) by mouth 3 (three) times daily as needed for dizziness. Patient not taking: Reported on 07/17/2016 10/08/15   BLily Kocher PA-C    Allergies Azithromycin; Erythromycin; Penicillins; and Tetracycline    Social History Social History  Substance Use Topics  . Smoking status: Never Smoker  . Smokeless tobacco: Never Used  . Alcohol use 0.0 oz/week     Comment: occasional    Review of Systems Patient denies headaches, rhinorrhea, blurry vision, numbness, shortness of breath, chest pain, edema, cough, abdominal pain, nausea, vomiting, diarrhea, dysuria, fevers, rashes or hallucinations unless otherwise stated above in HPI. ____________________________________________   PHYSICAL EXAM:  VITAL SIGNS: Vitals:   02/25/17 1411  BP: 136/81  Pulse: 80  Resp: 18  Temp: 98.9 F (37.2 C)  SpO2: 100%    Constitutional: Alert and oriented. Well appearing and in no acute distress. Eyes: Conjunctivae are normal.  Head: Atraumatic. Nose: No congestion/rhinnorhea. Mouth/Throat: Mucous membranes are moist.   Neck: No stridor. Painless ROM.  Cardiovascular: Normal rate, regular rhythm. Grossly normal heart sounds.  Good peripheral circulation. Respiratory: Normal respiratory effort.  No retractions. Lungs CTAB. Gastrointestinal: Soft and nontender. No distention. No abdominal bruits. No  CVA tenderness. Genitourinary:  Musculoskeletal: No lower extremity tenderness nor edema.  No joint effusions. Neurologic:  Normal speech and language. No gross focal neurologic deficits are appreciated. No facial droop Skin:  Skin is warm, dry and intact. No rash noted. Psychiatric: Mood and affect are normal. Speech and behavior are normal.  ____________________________________________   LABS (all labs ordered are listed, but only abnormal results are displayed)  No results found for this or any previous visit (from the past 24 hour(s)). ____________________________________________ _____________________________  [id number]   ____________________________________________   PROCEDURES  Procedure(s) performed:  Procedures    Critical Care performed: no ____________________________________________   INITIAL IMPRESSION / ASSESSMENT AND PLAN / ED COURSE  Pertinent labs & imaging results that were available during my care of the patient were reviewed by me and considered in my medical decision making (see chart for details).  DDX: migraine, tension, cluster, unlikely ich, sinusitis, meningitis  [name] is a 61 y.o. who presents to the ED with Hx of migarine  p/w HA sinice 9AM. Not worst HA ever. Gradual onset. HA similar to previous episodes. Denies focal neurologic symptoms. Denies trauma. No fevers or neck pain. No vision loss. Afebrile in ED. VSS. Exam as above. No meningeal signs. No CN, motor, sensory or cerebellar deficits. Temporal arteries palpable and non-tender. Appears well and non-toxic.  Will provide IV fluids for hydration and IV medications for symptom control.     Likely tension, non-specific or possible migraine HA. Clinical picture is not consistent with ICH, SAH, SDH, EDH, TIA, or CVA. No concern for meningitis or encephalitis. No concern for GCA/Temporal arteritis.  Pain improved. Repeat neuro exam is again without focal deficit, nuchal rigidity  or evidence of meningeal irritation.  Stable to D/C home, follow up with PCP or Neurology if persistent recurrent Has.  Have discussed with the patient and available family all diagnostics and treatments performed thus far and all questions were answered to the best of my ability. The patient demonstrates understanding and agreement with plan.        ____________________________________________   FINAL CLINICAL IMPRESSION(S) / ED DIAGNOSES  Final diagnoses:  None      NEW MEDICATIONS STARTED DURING THIS VISIT:  New Prescriptions   No medications on file     Note:  This document was prepared using Dragon voice recognition software and may include unintentional dictation errors.    Merlyn Lot, MD 02/25/17 [phone]

## 2017-02-25 NOTE — Discharge Instructions (Signed)

## 2017-02-25 NOTE — ED Triage Notes (Signed)
Pt in via POV with complaints of headache and nausea since 0500 today.  Pt reports hx of migraines, does not have any prescription medications for them; pt states, "I usually have to come in here and they give me a shot."  Pt reports associated photophobia, denies any changes to vision, denies any dizziness.  Pt ambulatory to triage room without difficulty.  NAD noted at this time.

## 2017-03-25 DIAGNOSIS — H18621 Keratoconus, unstable, right eye: Secondary | ICD-10-CM | POA: Diagnosis not present

## 2017-03-25 DIAGNOSIS — H18613 Keratoconus, stable, bilateral: Secondary | ICD-10-CM | POA: Diagnosis not present

## 2017-04-27 ENCOUNTER — Ambulatory Visit: Payer: BLUE CROSS/BLUE SHIELD | Admitting: Family Medicine

## 2017-04-27 ENCOUNTER — Ambulatory Visit: Payer: BLUE CROSS/BLUE SHIELD | Admitting: Internal Medicine

## 2017-04-27 ENCOUNTER — Ambulatory Visit: Payer: Self-pay | Admitting: Internal Medicine

## 2017-04-27 ENCOUNTER — Encounter: Payer: Self-pay | Admitting: Family Medicine

## 2017-04-27 NOTE — Progress Notes (Signed)
Encounter created in error. Patient scheduled with her PCP.

## 2017-04-28 ENCOUNTER — Ambulatory Visit (INDEPENDENT_AMBULATORY_CARE_PROVIDER_SITE_OTHER)
Admission: RE | Admit: 2017-04-28 | Discharge: 2017-04-28 | Disposition: A | Discharge status: Home / Self Care | Payer: BLUE CROSS/BLUE SHIELD | Source: Ambulatory Visit | Attending: Internal Medicine | Admitting: Internal Medicine

## 2017-04-28 ENCOUNTER — Encounter: Payer: Self-pay | Admitting: Internal Medicine

## 2017-04-28 ENCOUNTER — Ambulatory Visit (INDEPENDENT_AMBULATORY_CARE_PROVIDER_SITE_OTHER): Payer: BLUE CROSS/BLUE SHIELD | Admitting: Internal Medicine

## 2017-04-28 VITALS — BP 118/80 | HR 88 | Temp 97.9°F | Wt 148.0 lb

## 2017-04-28 DIAGNOSIS — Z23 Encounter for immunization: Secondary | ICD-10-CM | POA: Diagnosis not present

## 2017-04-28 DIAGNOSIS — F320 Major depressive disorder, single episode, mild: Secondary | ICD-10-CM

## 2017-04-28 DIAGNOSIS — M25562 Pain in left knee: Secondary | ICD-10-CM

## 2017-04-28 DIAGNOSIS — E119 Type 2 diabetes mellitus without complications: Secondary | ICD-10-CM

## 2017-04-28 DIAGNOSIS — R252 Cramp and spasm: Secondary | ICD-10-CM | POA: Diagnosis not present

## 2017-04-28 DIAGNOSIS — S8992XA Unspecified injury of left lower leg, initial encounter: Secondary | ICD-10-CM | POA: Diagnosis not present

## 2017-04-28 LAB — HEMOGLOBIN A1C: HEMOGLOBIN A1C: 9.3 % — AB (ref 4.6–6.5)

## 2017-04-28 LAB — COMPREHENSIVE METABOLIC PANEL
ALT: 49 U/L — ABNORMAL HIGH (ref 0–35)
AST: 48 U/L — AB (ref 0–37)
Albumin: 4 g/dL (ref 3.5–5.2)
Alkaline Phosphatase: 137 U/L — ABNORMAL HIGH (ref 39–117)
BUN: 11 mg/dL (ref 6–23)
CALCIUM: 9.5 mg/dL (ref 8.4–10.5)
CHLORIDE: 101 meq/L (ref 96–112)
CO2: 29 meq/L (ref 19–32)
CREATININE: 0.79 mg/dL (ref 0.40–1.20)
GFR: 95.4 mL/min (ref >60.00)
Glucose, Bld: 223 mg/dL — ABNORMAL HIGH (ref 70–99)
POTASSIUM: 3.5 meq/L (ref 3.5–5.1)
SODIUM: 138 meq/L (ref 135–145)
Total Bilirubin: 0.4 mg/dL (ref 0.2–1.2)
Total Protein: 7.1 g/dL (ref 6.0–8.3)

## 2017-04-28 MED ORDER — SERTRALINE HCL 25 MG PO TABS: 25.0000 mg | ORAL_TABLET | Freq: Every day | ORAL | 2 refills | Status: DC

## 2017-04-28 NOTE — Patient Instructions (Signed)

## 2017-04-28 NOTE — Progress Notes (Signed)
Subjective:    Patient ID: [name], female    DOB: [birth year], 61 y.o.   MRN: [id number]  HPI  Pt presents to the clinic today with c/o left knee pain. She reports this started 1 month ago after a fall down some steps. She reports her her left leg just gave out from under her. She did not seek evaluation at that time. She reports swelling but did not have any bruising. She reports it hurts her now, when she is laying in bed with her legs bent. She denies numbness and tingling. She has not taken anything OTC for this.  She also reports feelings of depression. She reports she is very stressed out. Her daughter passed in 2013. She never went through grief counseling at that time and reports her repressed feelings are starting to resurface. She also reports she is living with her husband again (they have been separated for 4 years) and it is not going as well as she thought it would. She would like to leave but reports she has not where to go and no money because she doesn't have a job.  She is also due to follow up on her DM 2. Her last A1C was 8.5%. She reports she has been out of her Glipizide for months. She does not test her sugar. She does not consume a low carb diet. She checks her feet daily. She is not sure the last time she had an eye exam.  She also reports she has been having muscle cramps. This can occur day or night. It resolves on its own without intervention, but she reports it hurts so bad when it's happening that it brings her to tears. She has not taken anything OTC for this.   Review of Systems  Past Medical History:  Diagnosis Date  . Asthma   . Diabetes mellitus without complication ([place]) 5-39-76   hx. NIDDM-dx. 3 weeks ago  . Hyperlipidemia    hx  . Seasonal allergies   . Ulcerative colitis ([place])     Current Outpatient Prescriptions  Medication Sig Dispense Refill  . albuterol (PROVENTIL HFA;VENTOLIN HFA) 108 (90 Base) MCG/ACT inhaler Inhale 2 puffs into  the lungs every 6 (six) hours as needed for wheezing or shortness of breath. (Patient not taking: Reported on 04/28/2017) 1 Inhaler 0  . glipiZIDE (GLUCOTROL) 5 MG tablet Take 1 tablet (5 mg total) by mouth 2 (two) times daily before a meal. (Patient not taking: Reported on 04/28/2017) 60 tablet 0   No current facility-administered medications for this visit.     Allergies  Allergen Reactions  . Azithromycin Itching    Muscle aches  . Erythromycin Nausea And Vomiting  . Penicillins Nausea And Vomiting    Has patient had a PCN reaction causing immediate rash, facial/tongue/throat swelling, SOB or lightheadedness with hypotension: Yes Has patient had a PCN reaction causing severe rash involving mucus membranes or skin necrosis: No Has patient had a PCN reaction that required hospitalization No Has patient had a PCN reaction occurring within the last 10 years: No If all of the above answers are "NO", then may proceed with Cephalosporin use.   . Tetracycline Nausea And Vomiting    Family History  Problem Relation Age of Onset  . Hypertension Mother   . Diabetes Mother   . Cancer Father        lung ca  . Seizures Daughter        64yr    Social History  Social History  . Marital status: Legally Separated    Spouse name: N/A  . Number of children: N/A  . Years of education: N/A   Occupational History  . Not on file.   Social History Main Topics  . Smoking status: Never Smoker  . Smokeless tobacco: Never Used  . Alcohol use 0.0 oz/week     Comment: occasional  . Drug use: No  . Sexual activity: Not Currently   Other Topics Concern  . Not on file   Social History Narrative  . No narrative on file     Constitutional: Denies fever, malaise, fatigue, headache or abrupt weight changes.  Respiratory: Denies difficulty breathing, shortness of breath, cough or sputum production.   Cardiovascular: Denies chest pain, chest tightness, palpitations or swelling in the hands or  feet.  Musculoskeletal: Pt reports muscle cramps and left knee pain. Denies decrease in range of motion, difficulty with gait, or joint swelling.  Skin: Denies redness, rashes, lesions or ulcercations.  Neurological: Denies dizziness, difficulty with memory, difficulty with speech or problems with balance and coordination.  Psych: Pt reports depression. Denies anxiety, SI/HI.  No other specific complaints in a complete review of systems (except as listed in HPI above).     Objective:   Physical Exam   BP 118/80   Pulse 88   Temp 97.9 F (36.6 C) (Oral)   Wt 148 lb (67.1 kg)   BMI 27.07 kg/m  Wt Readings from Last 3 Encounters:  04/28/17 148 lb (67.1 kg)  04/27/17 147 lb 12 oz (67 kg)  02/25/17 150 lb (68 kg)    General: Appears her stated age, in NAD. Skin: Warm, dry and intact. No ulcerations noted. Cardiovascular: Normal rate and rhythm. S1,S2 noted.  No murmur, rubs or gallops noted.  Pulmonary/Chest: Normal effort and positive vesicular breath sounds. No respiratory distress. No wheezes, rales or ronchi noted.  Musculoskeletal: Normal flexion, extension of the left knee. No joint swelling noted. No pain with palpation. Gait normal. Neurological: Alert and oriented. Sensation intact to BLE. Psychiatric: Mood and affect normal. Behavior is normal. Judgment and thought content normal.    BMET    Component Value Date/Time   NA 139 10/08/2015 0930   NA 138 06/23/2014 0503   K 3.6 10/08/2015 0930   K 4.2 06/23/2014 0503   CL 105 10/08/2015 0930   CL 104 06/23/2014 0503   CO2 26 10/08/2015 0930   CO2 27 06/23/2014 0503   GLUCOSE 184 (H) 10/08/2015 0930   GLUCOSE 246 (H) 06/23/2014 0503   BUN 12 10/08/2015 0930   BUN 17 06/23/2014 0503   CREATININE 0.74 10/08/2015 0930   CREATININE 0.86 06/23/2014 0503   CALCIUM 8.5 (L) 10/08/2015 0930   CALCIUM 8.7 06/23/2014 0503   GFRNONAA >60 10/08/2015 0930   GFRNONAA >60 06/23/2014 0503   GFRAA >60 10/08/2015 0930   GFRAA  >60 06/23/2014 0503    Lipid Panel     Component Value Date/Time   CHOL 223 (H) 11/07/2014 1651   CHOL 211 (H) 06/22/2014 1432   TRIG 385.0 (H) 11/07/2014 1651   TRIG 473 (H) 06/22/2014 1432   HDL 43.60 11/07/2014 1651   HDL 42 06/22/2014 1432   CHOLHDL 5 11/07/2014 1651   VLDL 77.0 (H) 11/07/2014 1651   VLDL SEE COMMENT 06/22/2014 1432   LDLCALC SEE COMMENT 06/22/2014 1432    CBC    Component Value Date/Time   WBC 5.3 10/08/2015 0930   RBC 3.93 10/08/2015 0930  HGB 11.6 (L) 10/08/2015 0930   HGB 12.2 06/23/2014 0503   HCT 35.2 (L) 10/08/2015 0930   HCT 37.2 06/23/2014 0503   PLT 218 10/08/2015 0930   PLT 251 06/23/2014 0503   MCV 89.6 10/08/2015 0930   MCV 89 06/23/2014 0503   MCH 29.5 10/08/2015 0930   MCHC 33.0 10/08/2015 0930   RDW 13.2 10/08/2015 0930   RDW 13.6 06/23/2014 0503   LYMPHSABS 2.2 10/08/2015 0930   LYMPHSABS 1.8 06/23/2014 0503   MONOABS 0.4 10/08/2015 0930   MONOABS 0.4 06/23/2014 0503   EOSABS 0.1 10/08/2015 0930   EOSABS 0.0 06/23/2014 0503   BASOSABS 0.0 10/08/2015 0930   BASOSABS 0.0 06/23/2014 0503    Hgb A1C Lab Results  Component Value Date   HGBA1C 8.5 (H) 07/17/2016           Assessment & Plan:   Left Knee Pain:  Xray of left knee today Discussed RICE therapy  Depression:  Support offered today She is not interested in referral for therapy at this time eRx for Zoloft 25 mg daily  DM 2:  Uncontrolled A1C today Will follow up after A1C today Glipizide refilled today but she will likely need additional meds Foot exam today Encouraged yearly eye exams  Muscle Cramps:  CMET today  Return precautions discussed, follow up with me in 4 weeks and let me know how your depression is doing Webb Silversmith, NP

## 2017-04-29 ENCOUNTER — Telehealth: Payer: Self-pay | Admitting: Internal Medicine

## 2017-04-29 NOTE — Telephone Encounter (Signed)
Best number [phone]   Pt saw lab results on my chart she would like a call to get clarification

## 2017-05-04 MED ORDER — GLIPIZIDE 10 MG PO TABS: 10.0000 mg | ORAL_TABLET | Freq: Two times a day (BID) | ORAL | 3 refills | Status: DC

## 2017-05-04 NOTE — Addendum Note (Signed)
Addended by: Lurlean Nanny on: 05/04/2017 05:43 PM   Modules accepted: Orders

## 2017-08-03 ENCOUNTER — Ambulatory Visit: Payer: Self-pay | Admitting: Internal Medicine

## 2017-08-03 DIAGNOSIS — Z0289 Encounter for other administrative examinations: Secondary | ICD-10-CM

## 2017-08-03 NOTE — Progress Notes (Deleted)
Subjective:    Patient ID: [name], female    DOB: [birth year], 62 y.o.   MRN: [id number]  HPI  Pt presents to the clinic today for followup of HLD and DM 2.  HLD: Her last LDL was. She is not taking any cholesterol lowering medications at this time.  DM 2: Her last A1C was 9.3%, 04/2017. She had not been taking her Glipizide as instructed, and was counseled on the importance of medication adherence. She reports she has been taking her Glipizide diligently. She is not checking her sugars. Her last eye exam was. She checks her feet.  Review of Systems   Past Medical History:  Diagnosis Date  . Asthma   . Diabetes mellitus without complication ([place]) 01[birth year]   hx. NIDDM-dx. 3 weeks ago  . Hyperlipidemia    hx  . Seasonal allergies   . Ulcerative colitis ([place])     Current Outpatient Medications  Medication Sig Dispense Refill  . albuterol (PROVENTIL HFA;VENTOLIN HFA) 108 (90 Base) MCG/ACT inhaler Inhale 2 puffs into the lungs every 6 (six) hours as needed for wheezing or shortness of breath. (Patient not taking: Reported on 04/28/2017) 1 Inhaler 0  . glipiZIDE (GLUCOTROL) 10 MG tablet Take 1 tablet (10 mg total) by mouth 2 (two) times daily before a meal. 60 tablet 3  . sertraline (ZOLOFT) 25 MG tablet Take 1 tablet (25 mg total) by mouth at bedtime. 30 tablet 2   No current facility-administered medications for this visit.     Allergies  Allergen Reactions  . Azithromycin Itching    Muscle aches  . Erythromycin Nausea And Vomiting  . Penicillins Nausea And Vomiting    Has patient had a PCN reaction causing immediate rash, facial/tongue/throat swelling, SOB or lightheadedness with hypotension: Yes Has patient had a PCN reaction causing severe rash involving mucus membranes or skin necrosis: No Has patient had a PCN reaction that required hospitalization No Has patient had a PCN reaction occurring within the last 10 years: No If all of the above answers are "NO",  then may proceed with Cephalosporin use.   . Tetracycline Nausea And Vomiting    Family History  Problem Relation Age of Onset  . Hypertension Mother   . Diabetes Mother   . Cancer Father        lung ca  . Seizures Daughter        94yr    Social History   Socioeconomic History  . Marital status: Legally Separated    Spouse name: Not on file  . Number of children: Not on file  . Years of education: Not on file  . Highest education level: Not on file  Social Needs  . Financial resource strain: Not on file  . Food insecurity - worry: Not on file  . Food insecurity - inability: Not on file  . Transportation needs - medical: Not on file  . Transportation needs - non-medical: Not on file  Occupational History  . Not on file  Tobacco Use  . Smoking status: Never Smoker  . Smokeless tobacco: Never Used  Substance and Sexual Activity  . Alcohol use: Yes    Alcohol/week: 0.0 oz    Comment: occasional  . Drug use: No  . Sexual activity: Not Currently  Other Topics Concern  . Not on file  Social History Narrative  . Not on file     Constitutional: Denies fever, malaise, fatigue, headache or abrupt weight changes.  HEENT: Denies eye pain, eye  redness, ear pain, ringing in the ears, wax buildup, runny nose, nasal congestion, bloody nose, or sore throat. Respiratory: Denies difficulty breathing, shortness of breath, cough or sputum production.   Cardiovascular: Denies chest pain, chest tightness, palpitations or swelling in the hands or feet.  Gastrointestinal: Denies abdominal pain, bloating, constipation, diarrhea or blood in the stool.  GU: Denies urgency, frequency, pain with urination, burning sensation, blood in urine, odor or discharge. Musculoskeletal: Denies decrease in range of motion, difficulty with gait, muscle pain or joint pain and swelling.  Skin: Denies redness, rashes, lesions or ulcercations.  Neurological: Denies dizziness, difficulty with memory,  difficulty with speech or problems with balance and coordination.  Psych: Denies anxiety, depression, SI/HI.  No other specific complaints in a complete review of systems (except as listed in HPI above).   Objective:   Physical Exam        Assessment & Plan:

## 2017-08-05 ENCOUNTER — Encounter: Payer: Self-pay | Admitting: Intensive Care

## 2017-08-05 ENCOUNTER — Emergency Department
Admission: EM | Admit: 2017-08-05 | Discharge: 2017-08-05 | Disposition: A | Discharge status: Home / Self Care | Payer: BLUE CROSS/BLUE SHIELD | Attending: Emergency Medicine | Admitting: Emergency Medicine

## 2017-08-05 ENCOUNTER — Emergency Department: Payer: BLUE CROSS/BLUE SHIELD

## 2017-08-05 ENCOUNTER — Other Ambulatory Visit: Payer: Self-pay

## 2017-08-05 DIAGNOSIS — E119 Type 2 diabetes mellitus without complications: Secondary | ICD-10-CM | POA: Diagnosis not present

## 2017-08-05 DIAGNOSIS — R109 Unspecified abdominal pain: Secondary | ICD-10-CM | POA: Diagnosis not present

## 2017-08-05 DIAGNOSIS — M5432 Sciatica, left side: Secondary | ICD-10-CM | POA: Diagnosis not present

## 2017-08-05 DIAGNOSIS — J45909 Unspecified asthma, uncomplicated: Secondary | ICD-10-CM | POA: Insufficient documentation

## 2017-08-05 LAB — URINALYSIS, COMPLETE (UACMP) WITH MICROSCOPIC
BACTERIA UA: NONE SEEN
Bilirubin Urine: NEGATIVE
GLUCOSE, UA: NEGATIVE mg/dL
Ketones, ur: NEGATIVE mg/dL
Leukocytes, UA: NEGATIVE
NITRITE: NEGATIVE
PROTEIN: NEGATIVE mg/dL
SPECIFIC GRAVITY, URINE: 1.02 (ref 1.005–1.030)
pH: 5 (ref 5.0–8.0)

## 2017-08-05 LAB — COMPREHENSIVE METABOLIC PANEL
ALT: 44 U/L (ref 14–54)
ANION GAP: 7 (ref 5–15)
AST: 35 U/L (ref 15–41)
Albumin: 3.8 g/dL (ref 3.5–5.0)
Alkaline Phosphatase: 136 U/L — ABNORMAL HIGH (ref 38–126)
BILIRUBIN TOTAL: 0.7 mg/dL (ref 0.3–1.2)
BUN: 11 mg/dL (ref 6–20)
CO2: 27 mmol/L (ref 22–32)
Calcium: 9.1 mg/dL (ref 8.9–10.3)
Chloride: 103 mmol/L (ref 101–111)
Creatinine, Ser: 0.8 mg/dL (ref 0.44–1.00)
GFR calc non Af Amer: >60 mL/min (ref >60)
Glucose, Bld: 143 mg/dL — ABNORMAL HIGH (ref 65–99)
POTASSIUM: 3.9 mmol/L (ref 3.5–5.1)
Sodium: 137 mmol/L (ref 135–145)
TOTAL PROTEIN: 7.8 g/dL (ref 6.5–8.1)

## 2017-08-05 LAB — CBC WITH DIFFERENTIAL/PLATELET
Basophils Absolute: 0 10*3/uL (ref 0–0.1)
Basophils Relative: 1 %
Eosinophils Absolute: 0.1 10*3/uL (ref 0–0.7)
Eosinophils Relative: 3 %
HEMATOCRIT: 37.8 % (ref 35.0–47.0)
Hemoglobin: 12.4 g/dL (ref 12.0–16.0)
LYMPHS PCT: 39 %
Lymphs Abs: 2 10*3/uL (ref 1.0–3.6)
MCH: 28.8 pg (ref 26.0–34.0)
MCHC: 32.7 g/dL (ref 32.0–36.0)
MCV: 88 fL (ref 80.0–100.0)
MONO ABS: 0.4 10*3/uL (ref 0.2–0.9)
MONOS PCT: 8 %
NEUTROS ABS: 2.5 10*3/uL (ref 1.4–6.5)
Neutrophils Relative %: 49 %
PLATELETS: 234 10*3/uL (ref 150–440)
RBC: 4.3 MIL/uL (ref 3.80–5.20)
RDW: 13.6 % (ref 11.5–14.5)
WBC: 5 10*3/uL (ref 3.6–11.0)

## 2017-08-05 LAB — GLUCOSE, CAPILLARY: GLUCOSE-CAPILLARY: 133 mg/dL — AB (ref 65–99)

## 2017-08-05 MED ORDER — MORPHINE SULFATE (PF) 4 MG/ML IV SOLN
4.0000 mg | Freq: Once | INTRAVENOUS | Status: AC
  Administered 2017-08-05: 4 mg via INTRAVENOUS
  Filled 2017-08-05: qty 1

## 2017-08-05 MED ORDER — METHYLPREDNISOLONE SODIUM SUCC 125 MG IJ SOLR
125.0000 mg | Freq: Once | INTRAMUSCULAR | Status: AC
  Administered 2017-08-05: 125 mg via INTRAVENOUS
  Filled 2017-08-05: qty 2

## 2017-08-05 MED ORDER — OXYCODONE-ACETAMINOPHEN 5-325 MG PO TABS: 1.0000 | ORAL_TABLET | Freq: Three times a day (TID) | ORAL | 0 refills | Status: DC | PRN

## 2017-08-05 MED ORDER — PREDNISONE 10 MG (21) PO TBPK: ORAL_TABLET | Freq: Every day | ORAL | 0 refills | Status: DC

## 2017-08-05 NOTE — ED Notes (Signed)
Pt alert and oriented X4, active, cooperative, pt in NAD. RR even and unlabored, color WNL.  Pt informed to return if any life threatening symptoms occur.  Discharge and followup instructions reviewed. Pt leaving with family member who is driving.

## 2017-08-05 NOTE — ED Notes (Signed)
Patient transported to CT 

## 2017-08-05 NOTE — ED Triage Notes (Signed)
Patient c/o L sided flank pain X2 days. Starts in front and radiates to back. Ambulatory with grimace on face. No respiratory distress noted. HX diabetes.

## 2017-08-05 NOTE — ED Notes (Signed)
Pt c/o lower back pain to both sides that radiates to left lower abdomen X 2 days. Denies urinary sx. Pt reports blood sugar has been running high at home, 209 yesterday. Denies NVD. Pt alert and oriented X4, active, cooperative, pt in NAD. RR even and unlabored, color WNL.

## 2017-08-05 NOTE — ED Provider Notes (Signed)
[facility] Emergency Department Provider Note       Time seen: ----------------------------------------- 7:36 AM on 08/05/2017 -----------------------------------------   I have reviewed the triage vital signs and the nursing notes.  HISTORY   Chief Complaint Flank Pain (Left)    HPI [name] is a 62 y.o. female with a history of asthma, diabetes, hyperlipidemia, ulcerative colitis who presents to the ED for left flank pain for the past 2 days.  Patient states it starts in the front radiates to the back.  Patient also reports that radiates down her left leg.  Pain is 8 out of 10, nothing makes it better.  Past Medical History:  Diagnosis Date  . Asthma   . Diabetes mellitus without complication ([place]) 6-38-93   hx. NIDDM-dx. 3 weeks ago  . Hyperlipidemia    hx  . Seasonal allergies   . Ulcerative colitis [facility])     Patient Active Problem List   Diagnosis Date Noted  . HLD (hyperlipidemia) 08/02/2014  . Type 2 diabetes mellitus ([place]) 07/31/2014  . ULCERATIVE COLITIS 06/26/2008  . Chronic cholecystitis with calculus 06/26/2008  . Allergy-induced asthma 03/09/2008    Past Surgical History:  Procedure Laterality Date  . BUNIONECTOMY     bilateral and toe nails of big toes removed  . CESAREAN SECTION  1997/2006  . CHOLECYSTECTOMY N/A 12/01/2012   Procedure: LAPAROSCOPIC CHOLECYSTECTOMY;  Surgeon: Stark Klein, MD;  Location: [facility] ORS;  Service: General;  Laterality: N/A;  . COLONOSCOPY W/ POLYPECTOMY    . EYE SURGERY Bilateral 2013  . TONSILLECTOMY      Allergies Azithromycin; Erythromycin; Penicillins; and Tetracycline  Social History Social History   Tobacco Use  . Smoking status: Never Smoker  . Smokeless tobacco: Never Used  Substance Use Topics  . Alcohol use: Yes    Alcohol/week: 0.0 oz    Comment: occasional  . Drug use: No    Review of Systems Constitutional: Negative for fever. Cardiovascular: Negative for chest  pain. Respiratory: Negative for shortness of breath. Gastrointestinal: Positive for flank pain Genitourinary: Negative for dysuria. Musculoskeletal: Negative for back pain. Skin: Negative for rash. Neurological: Negative for headaches, focal weakness or numbness.  All systems negative/normal/unremarkable except as stated in the HPI  ____________________________________________   PHYSICAL EXAM:  VITAL SIGNS: ED Triage Vitals  Enc Vitals Group     BP 08/05/17 0706 140/71     Pulse Rate 08/05/17 0706 78     Resp 08/05/17 0706 18     Temp 08/05/17 0706 97.7 F (36.5 C)     Temp Source 08/05/17 0706 Oral     SpO2 08/05/17 0706 100 %     Weight 08/05/17 0706 150 lb (68 kg)     Height 08/05/17 0706 5' 2"  (1.575 m)     Head Circumference --      Peak Flow --      Pain Score 08/05/17 0711 8     Pain Loc --      Pain Edu? --      Excl. in [place]? --     Constitutional: Alert and oriented. Well appearing and in no distress. Eyes: Conjunctivae are normal. Normal extraocular movements. ENT   Head: Normocephalic and atraumatic.   Nose: No congestion/rhinnorhea.   Mouth/Throat: Mucous membranes are moist.   Neck: No stridor. Cardiovascular: Normal rate, regular rhythm. No murmurs, rubs, or gallops. Respiratory: Normal respiratory effort without tachypnea nor retractions. Breath sounds are clear and equal bilaterally. No wheezes/rales/rhonchi. Gastrointestinal: Left flank  tenderness, no rebound or guarding.  Normal bowel sounds. Musculoskeletal: Nontender with normal range of motion in extremities. No lower extremity tenderness nor edema. Neurologic:  Normal speech and language. No gross focal neurologic deficits are appreciated.  Positive straight leg raise examination of the left Skin:  Skin is warm, dry and intact. No rash noted. Psychiatric: Mood and affect are normal. Speech and behavior are normal.  ____________________________________________  EKG: Interpreted by me.   Sinus rhythm the rate is 72 bpm, normal PR interval, normal QRS size, normal QT, normal axis  ____________________________________________  ED COURSE:  As part of my medical decision making, I reviewed the following data within the [facility] History obtained from family if available, nursing notes, old chart and ekg, as well as notes from prior ED visits. Patient presented for flank pain, we will assess with labs and imaging as indicated at this time.   Procedures ____________________________________________   LABS (pertinent positives/negatives)  Labs Reviewed  COMPREHENSIVE METABOLIC PANEL - Abnormal; Notable for the following components:      Result Value   Glucose, Bld 143 (*)    Alkaline Phosphatase 136 (*)    All other components within normal limits  URINALYSIS, COMPLETE (UACMP) WITH MICROSCOPIC - Abnormal; Notable for the following components:   Color, Urine YELLOW (*)    APPearance HAZY (*)    Hgb urine dipstick MODERATE (*)    Squamous Epithelial / LPF 0-5 (*)    All other components within normal limits  GLUCOSE, CAPILLARY - Abnormal; Notable for the following components:   Glucose-Capillary 133 (*)    All other components within normal limits  CBC WITH DIFFERENTIAL/PLATELET    RADIOLOGY Images were viewed by me   IMPRESSION: No acute intra-abdominal or pelvic finding by noncontrast CT.  Remote cholecystectomy  No acute obstructing urinary tract or ureteral calculus.  ____________________________________________  DIFFERENTIAL DIAGNOSIS   Muscle strain, shingles, sciatica, renal colic, gas pain, diverticulitis,  FINAL ASSESSMENT AND PLAN  Flank pain, sciatica   Plan: Patient had presented for left-sided flank pain. Patient's labs not reveal any acute process. Patient's imaging did reveal some degenerative disc disease for which she will be placed on steroids and pain medicine.  Otherwise she is stable for outpatient follow-up.  CT  scan was otherwise unremarkable.   Earleen Newport, MD   Note: This note was generated in part or whole with voice recognition software. Voice recognition is usually quite accurate but there are transcription errors that can and very often do occur. I apologize for any typographical errors that were not detected and corrected.     Earleen Newport, MD 08/05/17 [phone]

## 2017-09-21 DIAGNOSIS — Z7984 Long term (current) use of oral hypoglycemic drugs: Secondary | ICD-10-CM | POA: Diagnosis not present

## 2017-09-21 DIAGNOSIS — R350 Frequency of micturition: Secondary | ICD-10-CM | POA: Diagnosis not present

## 2017-09-21 DIAGNOSIS — R631 Polydipsia: Secondary | ICD-10-CM | POA: Diagnosis not present

## 2017-09-21 DIAGNOSIS — E1165 Type 2 diabetes mellitus with hyperglycemia: Secondary | ICD-10-CM | POA: Diagnosis not present

## 2017-09-24 ENCOUNTER — Ambulatory Visit: Payer: Self-pay | Admitting: Internal Medicine

## 2017-09-24 ENCOUNTER — Ambulatory Visit: Payer: Self-pay | Admitting: Family Medicine

## 2017-09-24 ENCOUNTER — Encounter: Payer: Self-pay | Admitting: *Deleted

## 2017-09-24 DIAGNOSIS — J45909 Unspecified asthma, uncomplicated: Secondary | ICD-10-CM | POA: Diagnosis not present

## 2017-09-24 DIAGNOSIS — Z7984 Long term (current) use of oral hypoglycemic drugs: Secondary | ICD-10-CM | POA: Diagnosis not present

## 2017-09-24 DIAGNOSIS — R509 Fever, unspecified: Secondary | ICD-10-CM | POA: Insufficient documentation

## 2017-09-24 DIAGNOSIS — M791 Myalgia, unspecified site: Secondary | ICD-10-CM | POA: Diagnosis not present

## 2017-09-24 DIAGNOSIS — J111 Influenza due to unidentified influenza virus with other respiratory manifestations: Secondary | ICD-10-CM | POA: Insufficient documentation

## 2017-09-24 DIAGNOSIS — Z79899 Other long term (current) drug therapy: Secondary | ICD-10-CM | POA: Insufficient documentation

## 2017-09-24 DIAGNOSIS — E119 Type 2 diabetes mellitus without complications: Secondary | ICD-10-CM | POA: Diagnosis not present

## 2017-09-24 MED ORDER — ACETAMINOPHEN 325 MG PO TABS
650.0000 mg | ORAL_TABLET | Freq: Once | ORAL | Status: AC
  Administered 2017-09-24: 650 mg via ORAL
  Filled 2017-09-24: qty 2

## 2017-09-24 NOTE — ED Triage Notes (Signed)
Pt reports generalized body aches since this morning-denies any other symptoms. Pt says she felt like she has been wheezing, but no cough (clear in triage), reports her inhaler is out of date so she did not use it.  No meds for fever or pain PTA. Denies sick contacts.

## 2017-09-25 ENCOUNTER — Emergency Department
Admission: EM | Admit: 2017-09-25 | Discharge: 2017-09-25 | Disposition: A | Discharge status: Home / Self Care | Payer: BLUE CROSS/BLUE SHIELD | Attending: Emergency Medicine | Admitting: Emergency Medicine

## 2017-09-25 DIAGNOSIS — R6889 Other general symptoms and signs: Secondary | ICD-10-CM

## 2017-09-25 LAB — COMPREHENSIVE METABOLIC PANEL
ALBUMIN: 3.8 g/dL (ref 3.5–5.0)
ALK PHOS: 156 U/L — AB (ref 38–126)
ALT: 90 U/L — AB (ref 14–54)
AST: 87 U/L — AB (ref 15–41)
Anion gap: 9 (ref 5–15)
BUN: 12 mg/dL (ref 6–20)
CALCIUM: 8.8 mg/dL — AB (ref 8.9–10.3)
CO2: 24 mmol/L (ref 22–32)
CREATININE: 0.7 mg/dL (ref 0.44–1.00)
Chloride: 105 mmol/L (ref 101–111)
GFR calc Af Amer: >60 mL/min (ref >60)
GFR calc non Af Amer: >60 mL/min (ref >60)
GLUCOSE: 134 mg/dL — AB (ref 65–99)
Potassium: 3.6 mmol/L (ref 3.5–5.1)
SODIUM: 138 mmol/L (ref 135–145)
Total Bilirubin: 0.8 mg/dL (ref 0.3–1.2)
Total Protein: 7.4 g/dL (ref 6.5–8.1)

## 2017-09-25 LAB — CBC
HCT: 37.9 % (ref 35.0–47.0)
HEMOGLOBIN: 12.7 g/dL (ref 12.0–16.0)
MCH: 28.8 pg (ref 26.0–34.0)
MCHC: 33.4 g/dL (ref 32.0–36.0)
MCV: 86.4 fL (ref 80.0–100.0)
Platelets: 212 10*3/uL (ref 150–440)
RBC: 4.39 MIL/uL (ref 3.80–5.20)
RDW: 13.4 % (ref 11.5–14.5)
WBC: 4 10*3/uL (ref 3.6–11.0)

## 2017-09-25 LAB — INFLUENZA PANEL BY PCR (TYPE A & B)
INFLAPCR: NEGATIVE
Influenza B By PCR: NEGATIVE

## 2017-09-25 MED ORDER — KETOROLAC TROMETHAMINE 30 MG/ML IJ SOLN
30.0000 mg | Freq: Once | INTRAMUSCULAR | Status: AC
  Administered 2017-09-25: 30 mg via INTRAVENOUS
  Filled 2017-09-25: qty 1

## 2017-09-25 MED ORDER — SODIUM CHLORIDE 0.9 % IV BOLUS (SEPSIS)
1000.0000 mL | Freq: Once | INTRAVENOUS | Status: AC
  Administered 2017-09-25: 1000 mL via INTRAVENOUS

## 2017-09-25 NOTE — ED Provider Notes (Signed)
[facility] Emergency Department Provider Note _   First MD Initiated Contact with Patient 09/25/17 0104     (approximate)  I have reviewed the triage vital signs and the nursing notes.   HISTORY  Chief Complaint Generalized Body Aches   HPI Shalae Belmonte is a 62 y.o. female with below list of chronic medical conditions presents to the emergency department with 1 day history of generalized body aches patient states that she has had wheezing however no cough.  Patient noted to be febrile on presentation with temperature 100.6.  Patient admits to sick contacts at work  Past Medical History:  Diagnosis Date  . Asthma   . Diabetes mellitus without complication ([place]) 3-78-58   hx. NIDDM-dx. 3 weeks ago  . Hyperlipidemia    hx  . Seasonal allergies   . Ulcerative colitis [facility])     Patient Active Problem List   Diagnosis Date Noted  . HLD (hyperlipidemia) 08/02/2014  . Type 2 diabetes mellitus ([place]) 07/31/2014  . ULCERATIVE COLITIS 06/26/2008  . Chronic cholecystitis with calculus 06/26/2008  . Allergy-induced asthma 03/09/2008    Past Surgical History:  Procedure Laterality Date  . BUNIONECTOMY     bilateral and toe nails of big toes removed  . CESAREAN SECTION  1997/2006  . CHOLECYSTECTOMY N/A 12/01/2012   Procedure: LAPAROSCOPIC CHOLECYSTECTOMY;  Surgeon: Stark Klein, MD;  Location: [facility] ORS;  Service: General;  Laterality: N/A;  . COLONOSCOPY W/ POLYPECTOMY    . EYE SURGERY Bilateral 2013  . TONSILLECTOMY      Prior to Admission medications   Medication Sig Start Date End Date Taking? Authorizing Provider  albuterol (PROVENTIL HFA;VENTOLIN HFA) 108 (90 Base) MCG/ACT inhaler Inhale 2 puffs into the lungs every 6 (six) hours as needed for wheezing or shortness of breath. 02/26/16   Jearld Fenton, NP  glipiZIDE (GLUCOTROL) 10 MG tablet Take 1 tablet (10 mg total) by mouth 2 (two) times daily before a meal. Patient taking differently: Take 5  mg by mouth 2 (two) times daily before a meal.  05/04/17   Baity, Coralie Keens, NP  oxyCODONE-acetaminophen (PERCOCET) 5-325 MG tablet Take 1-2 tablets by mouth every 8 (eight) hours as needed. 08/05/17   Earleen Newport, MD  predniSONE (STERAPRED UNI-PAK 21 TAB) 10 MG (21) TBPK tablet Take by mouth daily. Dispense steroid taper as directed 08/05/17   Earleen Newport, MD  sertraline (ZOLOFT) 25 MG tablet Take 1 tablet (25 mg total) by mouth at bedtime. 04/28/17   Jearld Fenton, NP    Allergies Azithromycin; Erythromycin; Penicillins; and Tetracycline  Family History  Problem Relation Age of Onset  . Hypertension Mother   . Diabetes Mother   . Cancer Father        lung ca  . Seizures Daughter        32yr    Social History Social History   Tobacco Use  . Smoking status: Never Smoker  . Smokeless tobacco: Never Used  Substance Use Topics  . Alcohol use: Yes    Alcohol/week: 0.0 oz    Comment: occasional  . Drug use: No    Review of Systems Constitutional: Positive for fever/chills Eyes: No visual changes. ENT: No sore throat. Cardiovascular: Denies chest pain. Respiratory: Denies shortness of breath. Gastrointestinal: No abdominal pain.  No nausea, no vomiting.  No diarrhea.  No constipation. Genitourinary: Negative for dysuria. Musculoskeletal: Negative for neck pain.  Negative for back pain.  Positive for generalized muscle aches  Integumentary: Negative for rash. Neurological: Negative for headaches, focal weakness or numbness.  ____________________________________________   PHYSICAL EXAM:  VITAL SIGNS: ED Triage Vitals [09/24/17 2118]  Enc Vitals Group     BP (!) 144/70     Pulse Rate (!) 106     Resp (!) 21     Temp (!) 100.6 F (38.1 C)     Temp Source Oral     SpO2 97 %     Weight      Height      Head Circumference      Peak Flow      Pain Score 10     Pain Loc      Pain Edu?      Excl. in [place]?     Constitutional: Alert and oriented. Well  appearing and in no acute distress. Eyes: Conjunctivae are normal.  Head: Atraumatic. Mouth/Throat: Mucous membranes are moist. Oropharynx non-erythematous. Neck: No stridor.  Cardiovascular: Normal rate, regular rhythm. Good peripheral circulation. Grossly normal heart sounds. Respiratory: Normal respiratory effort.  No retractions. Lungs CTAB. Gastrointestinal: Soft and nontender. No distention.   Musculoskeletal: No lower extremity tenderness nor edema. No gross deformities of extremities. Neurologic:  Normal speech and language. No gross focal neurologic deficits are appreciated.  Skin:  Skin is warm, dry and intact. No rash noted. Psychiatric: Mood and affect are normal. Speech and behavior are normal.  ____________________________________________   LABS (all labs ordered are listed, but only abnormal results are displayed)  Labs Reviewed  COMPREHENSIVE METABOLIC PANEL - Abnormal; Notable for the following components:      Result Value   Glucose, Bld 134 (*)    Calcium 8.8 (*)    AST 87 (*)    ALT 90 (*)    Alkaline Phosphatase 156 (*)    All other components within normal limits  INFLUENZA PANEL BY PCR (TYPE A & B)  CBC   ____________________________________________   Procedures   ____________________________________________   INITIAL IMPRESSION / ASSESSMENT AND PLAN / ED COURSE  As part of my medical decision making, I reviewed the following data within the electronic MEDICAL RECORD NUMBER   45-year-old female presented with above-stated history and physical exam concerning for possible influenza and as such influenza PCR was obtained but was negative.  Consider the possibility of other viral etiology for the patient's symptoms.  No adventitious sounds noted on auscultation and as such chest x-ray was not performed.   ____________________________________________  FINAL CLINICAL IMPRESSION(S) / ED DIAGNOSES  Final diagnoses:  Flu-like symptoms     MEDICATIONS  GIVEN DURING THIS VISIT:  Medications  acetaminophen (TYLENOL) tablet 650 mg (650 mg Oral Given 09/24/17 2121)  ketorolac (TORADOL) 30 MG/ML injection 30 mg (30 mg Intravenous Given 09/25/17 0202)  sodium chloride 0.9 % bolus 1,000 mL (0 mLs Intravenous Stopped 09/25/17 0331)     ED Discharge Orders    None       Note:  This document was prepared using Dragon voice recognition software and may include unintentional dictation errors.    Gregor Hams, MD 09/25/17 [id number]

## 2017-09-25 NOTE — ED Notes (Signed)
Informed RN that patient has been roomed and is ready for evaluation.  Patient in NAD at this time and call bell placed within reach.   

## 2017-09-26 ENCOUNTER — Other Ambulatory Visit: Payer: Self-pay

## 2017-09-26 ENCOUNTER — Encounter: Payer: Self-pay | Admitting: Emergency Medicine

## 2017-09-26 ENCOUNTER — Ambulatory Visit
Admission: EM | Admit: 2017-09-26 | Discharge: 2017-09-26 | Disposition: A | Discharge status: Home / Self Care | Payer: BLUE CROSS/BLUE SHIELD | Attending: Family Medicine | Admitting: Family Medicine

## 2017-09-26 DIAGNOSIS — R509 Fever, unspecified: Secondary | ICD-10-CM | POA: Diagnosis not present

## 2017-09-26 DIAGNOSIS — N12 Tubulo-interstitial nephritis, not specified as acute or chronic: Secondary | ICD-10-CM | POA: Diagnosis not present

## 2017-09-26 DIAGNOSIS — R300 Dysuria: Secondary | ICD-10-CM | POA: Diagnosis not present

## 2017-09-26 LAB — URINALYSIS, COMPLETE (UACMP) WITH MICROSCOPIC
GLUCOSE, UA: NEGATIVE mg/dL
Glucose, UA: 100 mg/dL — AB
KETONES UR: 15 mg/dL — AB
KETONES UR: NEGATIVE mg/dL
Nitrite: NEGATIVE
Nitrite: NEGATIVE
PH: 6 (ref 5.0–8.0)
Protein, ur: 30 mg/dL — AB
Protein, ur: 30 mg/dL — AB
Specific Gravity, Urine: 1.02 (ref 1.005–1.030)
Specific Gravity, Urine: >1.03 — ABNORMAL HIGH (ref 1.005–1.030)
pH: 6 (ref 5.0–8.0)

## 2017-09-26 MED ORDER — SULFAMETHOXAZOLE-TRIMETHOPRIM 800-160 MG PO TABS: 1.0000 | ORAL_TABLET | Freq: Two times a day (BID) | ORAL | 0 refills | Status: DC

## 2017-09-26 MED ORDER — IBUPROFEN 800 MG PO TABS
800.0000 mg | ORAL_TABLET | Freq: Once | ORAL | Status: AC
  Administered 2017-09-26: 800 mg via ORAL

## 2017-09-26 MED ORDER — ACETAMINOPHEN 500 MG PO TABS
1000.0000 mg | ORAL_TABLET | Freq: Once | ORAL | Status: AC
  Administered 2017-09-26: 1000 mg via ORAL

## 2017-09-26 MED ORDER — PHENAZOPYRIDINE HCL 200 MG PO TABS: 200.0000 mg | ORAL_TABLET | Freq: Three times a day (TID) | ORAL | 0 refills | Status: DC | PRN

## 2017-09-26 NOTE — ED Triage Notes (Signed)
Patient c/o bodyaches, chill and burning when urinating that started for 2 days.  Patient was seen at ED  On 3/14 Flu test was Negative.

## 2017-09-26 NOTE — Discharge Instructions (Signed)
Please take antibiotics as prescribed.  Make sure you are drinking lots of fluids.  Alternate Tylenol and ibuprofen as needed for pain and fevers.  If any increasing fevers above 101, increasing back pain, nausea, vomiting, return to the emergency department or urgent care facility.  Follow-up with primary care provider in 3 days for recheck.

## 2017-09-26 NOTE — ED Provider Notes (Signed)
[facility]    CSN: [id number] Arrival date & time: 09/26/17  0831     History   Chief Complaint Chief Complaint  Patient presents with  . Generalized Body Aches  . Dysuria    HPI [name] is a 62 y.o. female presents to the urgent care facility for evaluation of fever, body aches, dysuria.  Patient states Thursday she developed fever of 100.6 with body aches.  She was seen in the emergency department where influenza test was negative.  Patient states her symptoms have increased she woke up this morning with a fever and she is also noted dysuria over the last 24 hours.  Patient describes increase in urinary frequency with burning with urination.  She denies any nausea or vomiting but is complaining of some mild lower back pain.  She denies any history of recent UTIs.  She last took ibuprofen at 4 AM, was given Tylenol in triage.  She denies any sore throat, cough, congestion, runny nose, chest pain, shortness of breath.   HPI  Past Medical History:  Diagnosis Date  . Asthma   . Diabetes mellitus without complication ([place]) 1-44-31   hx. NIDDM-dx. 3 weeks ago  . Hyperlipidemia    hx  . Seasonal allergies   . Ulcerative colitis [facility])     Patient Active Problem List   Diagnosis Date Noted  . HLD (hyperlipidemia) 08/02/2014  . Type 2 diabetes mellitus ([place]) 07/31/2014  . ULCERATIVE COLITIS 06/26/2008  . Chronic cholecystitis with calculus 06/26/2008  . Allergy-induced asthma 03/09/2008    Past Surgical History:  Procedure Laterality Date  . BUNIONECTOMY     bilateral and toe nails of big toes removed  . CESAREAN SECTION  1997/2006  . CHOLECYSTECTOMY N/A 12/01/2012   Procedure: LAPAROSCOPIC CHOLECYSTECTOMY;  Surgeon: Stark Klein, MD;  Location: [facility];  Service: General;  Laterality: N/A;  . COLONOSCOPY W/ POLYPECTOMY    . EYE SURGERY Bilateral 2013  . TONSILLECTOMY      OB History    No data available       Home Medications    Prior to  Admission medications   Medication Sig Start Date End Date Taking? Authorizing Provider  sertraline (ZOLOFT) 25 MG tablet Take 1 tablet (25 mg total) by mouth at bedtime. 04/28/17  Yes Baity, Coralie Keens, NP  albuterol (PROVENTIL HFA;VENTOLIN HFA) 108 (90 Base) MCG/ACT inhaler Inhale 2 puffs into the lungs every 6 (six) hours as needed for wheezing or shortness of breath. 02/26/16   Jearld Fenton, NP  glipiZIDE (GLUCOTROL) 10 MG tablet Take 1 tablet (10 mg total) by mouth 2 (two) times daily before a meal. Patient taking differently: Take 5 mg by mouth 2 (two) times daily before a meal.  05/04/17   Baity, Coralie Keens, NP  oxyCODONE-acetaminophen (PERCOCET) 5-325 MG tablet Take 1-2 tablets by mouth every 8 (eight) hours as needed. 08/05/17   Earleen Newport, MD  phenazopyridine (PYRIDIUM) 200 MG tablet Take 1 tablet (200 mg total) by mouth 3 (three) times daily as needed for pain. 09/26/17   Duanne Guess, PA-C  predniSONE (STERAPRED UNI-PAK 21 TAB) 10 MG (21) TBPK tablet Take by mouth daily. Dispense steroid taper as directed 08/05/17   Earleen Newport, MD  sulfamethoxazole-trimethoprim (BACTRIM DS,SEPTRA DS) 800-160 MG tablet Take 1 tablet by mouth 2 (two) times daily for 10 days. 09/26/17 10/06/17  Duanne Guess, PA-C    Family History Family History  Problem Relation Age of Onset  .  Hypertension Mother   . Diabetes Mother   . Cancer Father        lung ca  . Seizures Daughter        83yr    Social History Social History   Tobacco Use  . Smoking status: Never Smoker  . Smokeless tobacco: Never Used  Substance Use Topics  . Alcohol use: Yes    Alcohol/week: 0.0 oz    Comment: occasional  . Drug use: No     Allergies   Azithromycin; Erythromycin; Penicillins; and Tetracycline   Review of Systems Review of Systems  Constitutional: Negative for activity change, chills, fatigue and fever.  HENT: Negative for congestion, sinus pressure and sore throat.   Eyes: Negative  for visual disturbance.  Respiratory: Negative for cough, chest tightness and shortness of breath.   Cardiovascular: Negative for chest pain and leg swelling.  Gastrointestinal: Negative for abdominal pain, diarrhea, nausea and vomiting.  Genitourinary: Positive for dysuria and frequency. Negative for vaginal discharge.  Musculoskeletal: Positive for back pain. Negative for arthralgias and gait problem.  Skin: Negative for rash.  Neurological: Negative for weakness, numbness and headaches.  Hematological: Negative for adenopathy.  Psychiatric/Behavioral: Negative for agitation, behavioral problems and confusion.     Physical Exam Triage Vital Signs ED Triage Vitals  Enc Vitals Group     BP 09/26/17 0857 117/64     Pulse Rate 09/26/17 0857 (!) 111     Resp 09/26/17 0857 16     Temp 09/26/17 0857 (!) 101.6 F (38.7 C)     Temp Source 09/26/17 0857 Oral     SpO2 09/26/17 0857 99 %     Weight 09/26/17 0854 150 lb (68 kg)     Height 09/26/17 0854 5' 2"  (1.575 m)     Head Circumference --      Peak Flow --      Pain Score 09/26/17 0854 8     Pain Loc --      Pain Edu? --      Excl. in [place] --    No data found.  Updated Vital Signs BP 117/64 (BP Location: Left Arm)   Pulse (!) 111   Temp 100.1 F (37.8 C) (Oral)   Resp 16   Ht 5' 2"  (1.575 m)   Wt 150 lb (68 kg)   SpO2 99%   BMI 27.44 kg/m   Visual Acuity Right Eye Distance:   Left Eye Distance:   Bilateral Distance:    Right Eye Near:   Left Eye Near:    Bilateral Near:     Physical Exam  Constitutional: She appears well-developed and well-nourished. No distress.  HENT:  Head: Normocephalic and atraumatic.  Right Ear: External ear normal.  Left Ear: External ear normal.  Mouth/Throat: Oropharynx is clear and moist. No oropharyngeal exudate.  Eyes: Conjunctivae are normal.  Neck: Normal range of motion. Neck supple.  Cardiovascular: Normal rate and regular rhythm.  No murmur heard. Pulmonary/Chest: Effort  normal and breath sounds normal. No respiratory distress.  Abdominal: Soft. She exhibits no distension. There is no tenderness.  Musculoskeletal: She exhibits no edema.  No back tenderness to percussion bilaterally.  No CVA tenderness.  Lymphadenopathy:    She has cervical adenopathy (.  Posterior cervical lymphadenopathy present.).  Neurological: She is alert.  Skin: Skin is warm and dry.  Psychiatric: She has a normal mood and affect.  Nursing note and vitals reviewed.    UC Treatments / Results  Labs (all  labs ordered are listed, but only abnormal results are displayed) Labs Reviewed  URINALYSIS, COMPLETE (UACMP) WITH MICROSCOPIC - Abnormal; Notable for the following components:      Result Value   APPearance HAZY (*)    Hgb urine dipstick MODERATE (*)    Bilirubin Urine SMALL (*)    Ketones, ur 15 (*)    Protein, ur 30 (*)    Leukocytes, UA MODERATE (*)    Squamous Epithelial / LPF 6-30 (*)    Bacteria, UA FEW (*)    All other components within normal limits  URINALYSIS, COMPLETE (UACMP) WITH MICROSCOPIC - Abnormal; Notable for the following components:   Specific Gravity, Urine >1.030 (*)    Glucose, UA 100 (*)    Hgb urine dipstick MODERATE (*)    Bilirubin Urine SMALL (*)    Protein, ur 30 (*)    Leukocytes, UA SMALL (*)    Squamous Epithelial / LPF 0-5 (*)    Bacteria, UA RARE (*)    All other components within normal limits  URINE CULTURE    EKG  EKG Interpretation None       Radiology No results found.  Procedures Procedures (including critical care time)  Medications Ordered in UC Medications  acetaminophen (TYLENOL) tablet 1,000 mg (1,000 mg Oral Given 09/26/17 0907)  ibuprofen (ADVIL,MOTRIN) tablet 800 mg (800 mg Oral Given 09/26/17 1012)     Initial Impression / Assessment and Plan / UC Course  I have reviewed the triage vital signs and the nursing notes.  Pertinent labs & imaging results that were available during my care of the patient  were reviewed by me and considered in my medical decision making (see chart for details).     62 year old female with fever, dysuria, low back pain.  Urinalysis indicating urinary tract infection.  We will treat for pyelonephritis as she is having fever and lower back pain.  No nausea vomiting tolerating p.o. well.  She is given Tylenol and ibuprofen, temperature improved.  She is educated on signs and symptoms return to clinic for.  Urine culture pending.  She started on Bactrim DS 1 tab p.o. twice daily times 10 days.  Final Clinical Impressions(s) / UC Diagnoses   Final diagnoses:  Fever, unspecified  Dysuria  Pyelonephritis    ED Discharge Orders        Ordered    sulfamethoxazole-trimethoprim (BACTRIM DS,SEPTRA DS) 800-160 MG tablet  2 times daily     09/26/17 1011    phenazopyridine (PYRIDIUM) 200 MG tablet  3 times daily PRN     09/26/17 1011         Duanne Guess, [place] 09/26/17 1017

## 2017-09-29 LAB — URINE CULTURE

## 2017-10-01 ENCOUNTER — Encounter: Payer: Self-pay | Admitting: Internal Medicine

## 2017-10-01 ENCOUNTER — Ambulatory Visit (INDEPENDENT_AMBULATORY_CARE_PROVIDER_SITE_OTHER): Payer: BLUE CROSS/BLUE SHIELD | Admitting: Internal Medicine

## 2017-10-01 VITALS — BP 124/78 | HR 84 | Temp 98.1°F | Wt 149.0 lb

## 2017-10-01 DIAGNOSIS — N12 Tubulo-interstitial nephritis, not specified as acute or chronic: Secondary | ICD-10-CM

## 2017-10-01 NOTE — Progress Notes (Signed)
Subjective:    Patient ID: [name], female    DOB: [birth year], 62 y.o.   MRN: [id number]  HPI  Pt presents to the clinic today for multiple ER visit follow ups.  3/15: She went to the ER with c/o fever and body aches. Flu test was negative. She was treated with Tylenol and Toradol and advised supportive care for a viral illness.  3/16: She went to [place] with c/o fever and body aches. Urinalysis showed concern for infection. She was treated with Ibuprofen and Bactrim x 10 days for suspected pyelonephritis. Urine culture grew out E Coli. She has been taking the antibiotics as prescribed and reports she is feeling much better. No more fevers, chills or body aches. She has no urinary symptoms.   Review of Systems  Past Medical History:  Diagnosis Date  . Asthma   . Diabetes mellitus without complication ([place]) 12-07-76   hx. NIDDM-dx. 3 weeks ago  . Hyperlipidemia    hx  . Seasonal allergies   . Ulcerative colitis ([place])     Current Outpatient Medications  Medication Sig Dispense Refill  . albuterol (PROVENTIL HFA;VENTOLIN HFA) 108 (90 Base) MCG/ACT inhaler Inhale 2 puffs into the lungs every 6 (six) hours as needed for wheezing or shortness of breath. 1 Inhaler 0  . glipiZIDE (GLUCOTROL) 10 MG tablet Take 1 tablet (10 mg total) by mouth 2 (two) times daily before a meal. (Patient taking differently: Take 5 mg by mouth 2 (two) times daily before a meal. ) 60 tablet 3  . sertraline (ZOLOFT) 25 MG tablet Take 1 tablet (25 mg total) by mouth at bedtime. 30 tablet 2   No current facility-administered medications for this visit.     Allergies  Allergen Reactions  . Azithromycin Itching    Muscle aches  . Erythromycin Nausea And Vomiting  . Penicillins Nausea And Vomiting    Has patient had a PCN reaction causing immediate rash, facial/tongue/throat swelling, SOB or lightheadedness with hypotension: Yes Has patient had a PCN reaction causing severe rash involving mucus  membranes or skin necrosis: No Has patient had a PCN reaction that required hospitalization No Has patient had a PCN reaction occurring within the last 10 years: No If all of the above answers are "NO", then may proceed with Cephalosporin use.   . Tetracycline Nausea And Vomiting    Family History  Problem Relation Age of Onset  . Hypertension Mother   . Diabetes Mother   . Cancer Father        lung ca  . Seizures Daughter        35yr    Social History   Socioeconomic History  . Marital status: Legally Separated    Spouse name: Not on file  . Number of children: Not on file  . Years of education: Not on file  . Highest education level: Not on file  Occupational History  . Not on file  Social Needs  . Financial resource strain: Not on file  . Food insecurity:    Worry: Not on file    Inability: Not on file  . Transportation needs:    Medical: Not on file    Non-medical: Not on file  Tobacco Use  . Smoking status: Never Smoker  . Smokeless tobacco: Never Used  Substance and Sexual Activity  . Alcohol use: Yes    Alcohol/week: 0.0 oz    Comment: occasional  . Drug use: No  . Sexual activity: Not Currently  Lifestyle  . Physical activity:    Days per week: Not on file    Minutes per session: Not on file  . Stress: Not on file  Relationships  . Social connections:    Talks on phone: Not on file    Gets together: Not on file    Attends religious service: Not on file    Active member of club or organization: Not on file    Attends meetings of clubs or organizations: Not on file    Relationship status: Not on file  . Intimate partner violence:    Fear of current or ex partner: Not on file    Emotionally abused: Not on file    Physically abused: Not on file    Forced sexual activity: Not on file  Other Topics Concern  . Not on file  Social History Narrative  . Not on file     Constitutional: Denies fever, malaise, fatigue, headache or abrupt weight  changes.  Gastrointestinal: Denies abdominal pain, bloating, constipation, diarrhea or blood in the stool.  GU: Denies urgency, frequency, pain with urination, burning sensation, blood in urine, odor or discharge.   No other specific complaints in a complete review of systems (except as listed in HPI above).     Objective:   Physical Exam  BP 124/78   Pulse 84   Temp 98.1 F (36.7 C) (Oral)   Wt 149 lb (67.6 kg)   SpO2 97%   BMI 27.25 kg/m  Wt Readings from Last 3 Encounters:  10/01/17 149 lb (67.6 kg)  09/26/17 150 lb (68 kg)  08/05/17 150 lb (68 kg)    General: Appears her stated age, well developed, well nourished in NAD. Abdomen: Soft and nontender. Normal bowel sounds. No CVA tenderness noted.   BMET    Component Value Date/Time   NA 138 09/25/2017 0117   NA 138 06/23/2014 0503   K 3.6 09/25/2017 0117   K 4.2 06/23/2014 0503   CL 105 09/25/2017 0117   CL 104 06/23/2014 0503   CO2 24 09/25/2017 0117   CO2 27 06/23/2014 0503   GLUCOSE 134 (H) 09/25/2017 0117   GLUCOSE 246 (H) 06/23/2014 0503   BUN 12 09/25/2017 0117   BUN 17 06/23/2014 0503   CREATININE 0.70 09/25/2017 0117   CREATININE 0.86 06/23/2014 0503   CALCIUM 8.8 (L) 09/25/2017 0117   CALCIUM 8.7 06/23/2014 0503   GFRNONAA >60 09/25/2017 0117   GFRNONAA >60 06/23/2014 0503   GFRAA >60 09/25/2017 0117   GFRAA >60 06/23/2014 0503    Lipid Panel     Component Value Date/Time   CHOL 223 (H) 11/07/2014 1651   CHOL 211 (H) 06/22/2014 1432   TRIG 385.0 (H) 11/07/2014 1651   TRIG 473 (H) 06/22/2014 1432   HDL 43.60 11/07/2014 1651   HDL 42 06/22/2014 1432   CHOLHDL 5 11/07/2014 1651   VLDL 77.0 (H) 11/07/2014 1651   VLDL SEE COMMENT 06/22/2014 1432   LDLCALC SEE COMMENT 06/22/2014 1432    CBC    Component Value Date/Time   WBC 4.0 09/25/2017 0117   RBC 4.39 09/25/2017 0117   HGB 12.7 09/25/2017 0117   HGB 12.2 06/23/2014 0503   HCT 37.9 09/25/2017 0117   HCT 37.2 06/23/2014 0503   PLT  212 09/25/2017 0117   PLT 251 06/23/2014 0503   MCV 86.4 09/25/2017 0117   MCV 89 06/23/2014 0503   MCH 28.8 09/25/2017 0117   MCHC 33.4 09/25/2017 0117  RDW 13.4 09/25/2017 0117   RDW 13.6 06/23/2014 0503   LYMPHSABS 2.0 08/05/2017 0751   LYMPHSABS 1.8 06/23/2014 0503   MONOABS 0.4 08/05/2017 0751   MONOABS 0.4 06/23/2014 0503   EOSABS 0.1 08/05/2017 0751   EOSABS 0.0 06/23/2014 0503   BASOSABS 0.0 08/05/2017 0751   BASOSABS 0.0 06/23/2014 0503    Hgb A1C Lab Results  Component Value Date   HGBA1C 9.3 (H) 04/28/2017           Assessment & Plan:   ER Follow Up for Pyelonephritis:  ER and UC records and labs reviewed She will continue Bactrim until finished Encouraged her to push fluids  Return precautions discussed  Webb Silversmith, NP

## 2017-10-03 ENCOUNTER — Encounter: Payer: Self-pay | Admitting: Internal Medicine

## 2017-10-03 NOTE — Patient Instructions (Signed)
Pyelonephritis, Adult Pyelonephritis is a kidney infection. The kidneys are organs that help clean your blood by moving waste out of your blood and into your pee (urine). This infection can happen quickly, or it can last for a long time. In most cases, it clears up with treatment and does not cause other problems. Follow these instructions at home: Medicines  Take over-the-counter and prescription medicines only as told by your doctor.  Take your antibiotic medicine as told by your doctor. Do not stop taking the medicine even if you start to feel better. General instructions  Drink enough fluid to keep your pee clear or pale yellow.  Avoid caffeine, tea, and carbonated drinks.  Pee (urinate) often. Avoid holding in pee for long periods of time.  Pee before and after sex.  After pooping (having a bowel movement), women should wipe from front to back. Use each tissue only once.  Keep all follow-up visits as told by your doctor. This is important. Contact a doctor if:  You do not feel better after 2 days.  Your symptoms get worse.  You have a fever. Get help right away if:  You cannot take your medicine or drink fluids as told.  You have chills and shaking.  You throw up (vomit).  You have very bad pain in your side (flank) or back.  You feel very weak or you pass out (faint). This information is not intended to replace advice given to you by your health care provider. Make sure you discuss any questions you have with your health care provider. Document Released: 08/07/2004 Document Revised: 12/06/2015 Document Reviewed: 10/23/2014 Elsevier Interactive Patient Education  [employer].

## 2017-10-04 DIAGNOSIS — Z880 Allergy status to penicillin: Secondary | ICD-10-CM | POA: Diagnosis not present

## 2017-10-04 DIAGNOSIS — R0602 Shortness of breath: Secondary | ICD-10-CM | POA: Diagnosis not present

## 2017-10-04 DIAGNOSIS — N10 Acute tubulo-interstitial nephritis: Secondary | ICD-10-CM | POA: Diagnosis not present

## 2017-10-04 DIAGNOSIS — E119 Type 2 diabetes mellitus without complications: Secondary | ICD-10-CM | POA: Diagnosis not present

## 2017-10-04 DIAGNOSIS — M545 Low back pain: Secondary | ICD-10-CM | POA: Diagnosis not present

## 2017-10-04 DIAGNOSIS — J45909 Unspecified asthma, uncomplicated: Secondary | ICD-10-CM | POA: Diagnosis not present

## 2017-10-04 DIAGNOSIS — Z9049 Acquired absence of other specified parts of digestive tract: Secondary | ICD-10-CM | POA: Diagnosis not present

## 2017-10-04 DIAGNOSIS — R102 Pelvic and perineal pain: Secondary | ICD-10-CM | POA: Diagnosis not present

## 2017-10-05 ENCOUNTER — Telehealth: Payer: Self-pay

## 2017-10-05 NOTE — Telephone Encounter (Signed)
Noted, will address at appt tomorrow. Records would be helpful. To ER if worse prior to then.

## 2017-10-05 NOTE — Telephone Encounter (Signed)
Pt went to ER in [place]; pt had lab testing and xrays; urine test showed needed different abx. Pt was given IV abx as well as rx for abx and tramadol for pain.pt scheduled ED f/u 30' appt on 10/06/17 at 9 AM. Pt said she was told at ED that the Abx pt was given on 09/26/17 by [facility] was wrong abx; but now that pt looks at rx given at [facility] appears same abx as given on 09/26/17. Pt will call [facility] ED and have records faxed to [phone]. FYI to Avie Echevaria NP.

## 2017-10-05 NOTE — Telephone Encounter (Signed)
I was unable to reach pt by phone and called Sondra Barges pts daughter who's phone no. Is not a working #.

## 2017-10-05 NOTE — Telephone Encounter (Signed)
PLEASE NOTE: All timestamps contained within this report are represented as [place] Standard Time. CONFIDENTIALTY NOTICE: This fax transmission is intended only for the addressee. It contains information that is legally privileged, confidential or otherwise protected from use or disclosure. If you are not the intended recipient, you are strictly prohibited from reviewing, disclosing, copying using or disseminating any of this information or taking any action in reliance on or regarding this information. If you have received this fax in error, please notify us immediately by telephone so that we can arrange for its return to [place]. Phone: [phone], Toll-Free: [phone], Fax: [phone] Page: 1 of 2 Call Id: [id number] [place] Patient Name: [name] Gender: Female DOB: 09-03-1955 Age: 62 Y 35 M 24 D Return Phone Number: [phone] (Primary) Address: City/State/Zip: [place] [address] Client [facility] Night - Client Client Site [place] Physician Webb Silversmith - NP Contact Type Call Who Is Calling Patient / Member / Family / Caregiver Call Type Triage / Clinical Relationship To Patient Self Return Phone Number [phone] (Primary) Chief Complaint BREATHING - shortness of breath or sounds breathless Reason for Call Symptomatic / Request for [place] states she is having tenderness near her pelvic bone. She is short of breath. Translation No Nurse Assessment Nurse: Ardine Bjork, RN, Melissa Date/Time (Eastern Time): 10/04/2017 5:59:13 PM Confirm and document reason for call. If symptomatic, describe symptoms. ---Caller states she is having tenderness near her pelvic bones on either side-Pubic Symphysis area/stomach soreness. She is short of breath. Sxs started On med for kidney  infection-started 8 days ago. States MD told her her sob should improve but it has not improved. Had sxs prior to starting medication. Does the patient have any new or worsening symptoms? ---Yes Will a triage be completed? ---Yes Related visit to physician within the last 2 weeks? ---Yes Does the PT have any chronic conditions? (i.e. diabetes, asthma, etc.) ---Yes List chronic conditions. ---DM, Ulcerative Colitis. Is this a behavioral health or substance abuse call? ---No Guidelines Guideline Title Affirmed Question Affirmed Notes Nurse Date/Time (Eastern Time) Breathing Difficulty [1] MODERATE difficulty breathing (e.g., speaks in phrases, SOB even at rest, pulse 100-120) AND [2] NEWonset or WORSE than normal Ardine Bjork, RN, Melissa 10/04/2017 6:03:48 PM Disp. Time Eilene Ghazi Time) Disposition Final User 10/04/2017 5:58:06 PM Send to Urgent Queue [facility] PLEASE NOTE: All timestamps contained within this report are represented as [place] Standard Time. CONFIDENTIALTY NOTICE: This fax transmission is intended only for the addressee. It contains information that is legally privileged, confidential or otherwise protected from use or disclosure. If you are not the intended recipient, you are strictly prohibited from reviewing, disclosing, copying using or disseminating any of this information or taking any action in reliance on or regarding this information. If you have received this fax in error, please notify us immediately by telephone so that we can arrange for its return to [place]. Phone: [phone], Toll-Free: [phone], Fax: [phone] Page: 2 of 2 Call Id: [id number] 10/04/2017 6:07:18 PM Go to ED Now Yes Ardine Bjork, RN, Lenna Sciara Caller Disagree/Comply Comply Caller Understands Yes PreDisposition InappropriateToAsk Care Advice Given Per Guideline GO TO ED NOW: You need to be seen in the Emergency Department. Go to the ER at ___________ [place] now. Drive carefully. NOTE TO  TRIAGER - DRIVING: CARE ADVICE given per Breathing Difficulty (Adult) guideline. * Another adult should drive.  CALL EMS 911 IF: you become worse. Referrals [facility] - ED

## 2017-10-06 ENCOUNTER — Ambulatory Visit (INDEPENDENT_AMBULATORY_CARE_PROVIDER_SITE_OTHER): Payer: BLUE CROSS/BLUE SHIELD | Admitting: Internal Medicine

## 2017-10-06 ENCOUNTER — Encounter: Payer: Self-pay | Admitting: Internal Medicine

## 2017-10-06 VITALS — BP 120/78 | HR 88 | Temp 98.2°F | Ht 62.0 in | Wt 153.0 lb

## 2017-10-06 DIAGNOSIS — R109 Unspecified abdominal pain: Secondary | ICD-10-CM | POA: Diagnosis not present

## 2017-10-06 DIAGNOSIS — N12 Tubulo-interstitial nephritis, not specified as acute or chronic: Secondary | ICD-10-CM

## 2017-10-06 LAB — POC URINALSYSI DIPSTICK (AUTOMATED)
Bilirubin, UA: NEGATIVE
GLUCOSE UA: NEGATIVE
Ketones, UA: NEGATIVE
NITRITE UA: NEGATIVE
Spec Grav, UA: >=1.03 — AB (ref 1.010–1.025)
UROBILINOGEN UA: 0.2 U/dL
pH, UA: 6 (ref 5.0–8.0)

## 2017-10-06 MED ORDER — KETOROLAC TROMETHAMINE 30 MG/ML IJ SOLN
30.0000 mg | Freq: Once | INTRAMUSCULAR | Status: AC
  Administered 2017-10-06: 30 mg via INTRAMUSCULAR

## 2017-10-06 MED ORDER — CIPROFLOXACIN HCL 500 MG PO TABS: 500.0000 mg | ORAL_TABLET | Freq: Two times a day (BID) | ORAL | 0 refills | Status: DC

## 2017-10-06 NOTE — Patient Instructions (Signed)
Pyelonephritis, Adult Pyelonephritis is a kidney infection. The kidneys are organs that help clean your blood by moving waste out of your blood and into your pee (urine). This infection can happen quickly, or it can last for a long time. In most cases, it clears up with treatment and does not cause other problems. Follow these instructions at home: Medicines  Take over-the-counter and prescription medicines only as told by your doctor.  Take your antibiotic medicine as told by your doctor. Do not stop taking the medicine even if you start to feel better. General instructions  Drink enough fluid to keep your pee clear or pale yellow.  Avoid caffeine, tea, and carbonated drinks.  Pee (urinate) often. Avoid holding in pee for long periods of time.  Pee before and after sex.  After pooping (having a bowel movement), women should wipe from front to back. Use each tissue only once.  Keep all follow-up visits as told by your doctor. This is important. Contact a doctor if:  You do not feel better after 2 days.  Your symptoms get worse.  You have a fever. Get help right away if:  You cannot take your medicine or drink fluids as told.  You have chills and shaking.  You throw up (vomit).  You have very bad pain in your side (flank) or back.  You feel very weak or you pass out (faint). This information is not intended to replace advice given to you by your health care provider. Make sure you discuss any questions you have with your health care provider. Document Released: 08/07/2004 Document Revised: 12/06/2015 Document Reviewed: 10/23/2014 Elsevier Interactive Patient Education  [employer].

## 2017-10-06 NOTE — Progress Notes (Signed)
Subjective:    Patient ID: [name], female    DOB: [birth year], 62 y.o.   MRN: [id number]  HPI  Pt presents to the clinic today for ER follow up. She went back to [facility] ER 10/04/17 with c/o RLQ pain and body aches. Urinalysis was concerning for continued pyelonephritis. Chest xray was normal. WBC is normal. She was given a liter of IV fluids, treated with IM Rocephin. She was given a RX for Septra and Tramadol and advised to follow up with her PCP. She reports she never picked up the 2nd abx from the pharmacy. She was just seen 3/21 for hospital follow up for pyelonephritis, for which she was treated with 10 days of Septra. Culture grew out E Coli, sensitive to Septra.  Review of Systems      Past Medical History:  Diagnosis Date  . Asthma   . Diabetes mellitus without complication ([place]) 07-15-70   hx. NIDDM-dx. 3 weeks ago  . Hyperlipidemia    hx  . Seasonal allergies   . Ulcerative colitis ([place])     Current Outpatient Medications  Medication Sig Dispense Refill  . albuterol (PROVENTIL HFA;VENTOLIN HFA) 108 (90 Base) MCG/ACT inhaler Inhale 2 puffs into the lungs every 6 (six) hours as needed for wheezing or shortness of breath. 1 Inhaler 0  . glipiZIDE (GLUCOTROL) 10 MG tablet Take 1 tablet (10 mg total) by mouth 2 (two) times daily before a meal. (Patient taking differently: Take 5 mg by mouth 2 (two) times daily before a meal. ) 60 tablet 3  . sertraline (ZOLOFT) 25 MG tablet Take 1 tablet (25 mg total) by mouth at bedtime. 30 tablet 2   No current facility-administered medications for this visit.     Allergies  Allergen Reactions  . Azithromycin Itching    Muscle aches  . Erythromycin Nausea And Vomiting  . Penicillins Nausea And Vomiting    Has patient had a PCN reaction causing immediate rash, facial/tongue/throat swelling, SOB or lightheadedness with hypotension: Yes Has patient had a PCN reaction causing severe rash involving mucus membranes or skin necrosis:  No Has patient had a PCN reaction that required hospitalization No Has patient had a PCN reaction occurring within the last 10 years: No If all of the above answers are "NO", then may proceed with Cephalosporin use.   . Tetracycline Nausea And Vomiting    Family History  Problem Relation Age of Onset  . Hypertension Mother   . Diabetes Mother   . Cancer Father        lung ca  . Seizures Daughter        74yr    Social History   Socioeconomic History  . Marital status: Legally Separated    Spouse name: Not on file  . Number of children: Not on file  . Years of education: Not on file  . Highest education level: Not on file  Occupational History  . Not on file  Social Needs  . Financial resource strain: Not on file  . Food insecurity:    Worry: Not on file    Inability: Not on file  . Transportation needs:    Medical: Not on file    Non-medical: Not on file  Tobacco Use  . Smoking status: Never Smoker  . Smokeless tobacco: Never Used  Substance and Sexual Activity  . Alcohol use: Yes    Alcohol/week: 0.0 oz    Comment: occasional  . Drug use: No  . Sexual activity: Not  Currently  Lifestyle  . Physical activity:    Days per week: Not on file    Minutes per session: Not on file  . Stress: Not on file  Relationships  . Social connections:    Talks on phone: Not on file    Gets together: Not on file    Attends religious service: Not on file    Active member of club or organization: Not on file    Attends meetings of clubs or organizations: Not on file    Relationship status: Not on file  . Intimate partner violence:    Fear of current or ex partner: Not on file    Emotionally abused: Not on file    Physically abused: Not on file    Forced sexual activity: Not on file  Other Topics Concern  . Not on file  Social History Narrative  . Not on file     Constitutional: Denies fever, malaise, fatigue, headache or abrupt weight changes.  Gastrointestinal: Pt  reports bilateral flank pain, RLQ abdominal pain. Denies bloating, constipation, diarrhea or blood in the stool.  GU: Denies urgency, frequency, pain with urination, burning sensation, blood in urine, odor or discharge.  No other specific complaints in a complete review of systems (except as listed in HPI above).  Objective:   Physical Exam   BP 120/78   Pulse 88   Temp 98.2 F (36.8 C) (Oral)   Wt 153 lb (69.4 kg)   SpO2 98%   BMI 27.98 kg/m  Wt Readings from Last 3 Encounters:  10/06/17 153 lb (69.4 kg)  10/01/17 149 lb (67.6 kg)  09/26/17 150 lb (68 kg)    General: Appears her stated age, well developed, well nourished in NAD. Abdomen: Soft and tender in the RLQ. Normal bowel sounds. No distention or masses noted. Bilateral CVA tenderness noted, R>L.   BMET    Component Value Date/Time   NA 138 09/25/2017 0117   NA 138 06/23/2014 0503   K 3.6 09/25/2017 0117   K 4.2 06/23/2014 0503   CL 105 09/25/2017 0117   CL 104 06/23/2014 0503   CO2 24 09/25/2017 0117   CO2 27 06/23/2014 0503   GLUCOSE 134 (H) 09/25/2017 0117   GLUCOSE 246 (H) 06/23/2014 0503   BUN 12 09/25/2017 0117   BUN 17 06/23/2014 0503   CREATININE 0.70 09/25/2017 0117   CREATININE 0.86 06/23/2014 0503   CALCIUM 8.8 (L) 09/25/2017 0117   CALCIUM 8.7 06/23/2014 0503   GFRNONAA >60 09/25/2017 0117   GFRNONAA >60 06/23/2014 0503   GFRAA >60 09/25/2017 0117   GFRAA >60 06/23/2014 0503    Lipid Panel     Component Value Date/Time   CHOL 223 (H) 11/07/2014 1651   CHOL 211 (H) 06/22/2014 1432   TRIG 385.0 (H) 11/07/2014 1651   TRIG 473 (H) 06/22/2014 1432   HDL 43.60 11/07/2014 1651   HDL 42 06/22/2014 1432   CHOLHDL 5 11/07/2014 1651   VLDL 77.0 (H) 11/07/2014 1651   VLDL SEE COMMENT 06/22/2014 1432   LDLCALC SEE COMMENT 06/22/2014 1432    CBC    Component Value Date/Time   WBC 4.0 09/25/2017 0117   RBC 4.39 09/25/2017 0117   HGB 12.7 09/25/2017 0117   HGB 12.2 06/23/2014 0503   HCT  37.9 09/25/2017 0117   HCT 37.2 06/23/2014 0503   PLT 212 09/25/2017 0117   PLT 251 06/23/2014 0503   MCV 86.4 09/25/2017 0117   MCV 89 06/23/2014  0503   MCH 28.8 09/25/2017 0117   MCHC 33.4 09/25/2017 0117   RDW 13.4 09/25/2017 0117   RDW 13.6 06/23/2014 0503   LYMPHSABS 2.0 08/05/2017 0751   LYMPHSABS 1.8 06/23/2014 0503   MONOABS 0.4 08/05/2017 0751   MONOABS 0.4 06/23/2014 0503   EOSABS 0.1 08/05/2017 0751   EOSABS 0.0 06/23/2014 0503   BASOSABS 0.0 08/05/2017 0751   BASOSABS 0.0 06/23/2014 0503    Hgb A1C Lab Results  Component Value Date   HGBA1C 9.3 (H) 04/28/2017           Assessment & Plan:   ER Follow Up for Pyelonephritis, Unresolved:  ER notes, labs and imaging reviewed She will not pick up the Septra as she has already been on this for 10 days Urinalysis: 2+ blood, trace leuks Will send urine culture eRx for Cipro 500 mg BID x 5 days Toradol 30 mg IM today Push fluids She will pick up the Tramadol that was already prescribed Work note provided If worse, she will notify me and we will get a CT scan of the abdomen  Return precautions discussed Webb Silversmith, NP

## 2017-10-06 NOTE — Addendum Note (Signed)
Addended by: Lurlean Nanny on: 10/06/2017 09:52 AM   Modules accepted: Orders

## 2017-10-07 LAB — URINE CULTURE
MICRO NUMBER:: 90376396
Result:: NO GROWTH
SPECIMEN QUALITY: ADEQUATE

## 2017-10-08 ENCOUNTER — Ambulatory Visit: Payer: BLUE CROSS/BLUE SHIELD | Admitting: Family Medicine

## 2017-10-08 DIAGNOSIS — Z0289 Encounter for other administrative examinations: Secondary | ICD-10-CM

## 2017-10-09 ENCOUNTER — Telehealth: Payer: Self-pay | Admitting: Internal Medicine

## 2017-10-09 NOTE — Telephone Encounter (Signed)
Pt is aware as instructed and expressed understanding, urine culture results given as well

## 2017-10-09 NOTE — Telephone Encounter (Signed)
She may still have a little discomfort. She can take Ibuprofen and use a heating pad for comfort.

## 2017-10-09 NOTE — Telephone Encounter (Signed)
Copied from [place] [id number]. Topic: Quick Communication - See Telephone Encounter >> Oct 09, 2017  1:32 PM Bea Graff, NT wrote: CRM for notification. See Telephone encounter for: 10/09/17. Pt would like a call with her lab results and to ask if she should be achy still from her UTI.

## 2017-12-09 ENCOUNTER — Ambulatory Visit (INDEPENDENT_AMBULATORY_CARE_PROVIDER_SITE_OTHER): Payer: BLUE CROSS/BLUE SHIELD | Admitting: Internal Medicine

## 2017-12-09 ENCOUNTER — Encounter: Payer: Self-pay | Admitting: Internal Medicine

## 2017-12-09 VITALS — BP 122/86 | HR 88 | Temp 98.0°F | Wt 151.0 lb

## 2017-12-09 DIAGNOSIS — R110 Nausea: Secondary | ICD-10-CM

## 2017-12-09 MED ORDER — ONDANSETRON HCL 4 MG PO TABS: 4.0000 mg | ORAL_TABLET | Freq: Three times a day (TID) | ORAL | 0 refills | Status: DC | PRN

## 2017-12-09 NOTE — Patient Instructions (Signed)
Nausea, Adult Feeling sick to your stomach (nausea) means that your stomach is upset or you feel like you have to throw up (vomit). Feeling sick to your stomach is usually not serious, but it may be an early sign of a more serious medical problem. As you feel sicker to your stomach, it can lead to throwing up (vomiting). If you throw up, or if you are not able to drink enough fluids, there is a risk of dehydration. Dehydration can make you feel tired and thirsty, have a dry mouth, and pee (urinate) less often. Older adults and people who have other diseases or a weak defense (immune) system have a higher risk of dehydration. The main goal of treating this condition is to:  Limit how often you feel sick to your stomach.  Prevent throwing up and dehydration.  Follow these instructions at home: Follow instructions from your doctor about how to care for yourself at home. Eating and drinking Follow these recommendations as told by your doctor:  Take an oral rehydration solution (ORS). This is a drink that is sold at pharmacies and stores.  Drink clear fluids in small amounts as you are able, such as: ? Water. ? Ice chips. ? Fruit juice that has water added (diluted fruit juice). ? Low-calorie sports drinks.  Eat bland, easy to digest foods in small amounts as you are able, such as: ? Bananas. ? Applesauce. ? Rice. ? Lean meats. ? Toast. ? Crackers.  Avoid drinking fluids that contain a lot of sugar or caffeine.  Avoid alcohol.  Avoid spicy or fatty foods.  General instructions  Drink enough fluid to keep your pee (urine) clear or pale yellow.  Wash your hands often. If you cannot use soap and water, use hand sanitizer.  Make sure that all people in your household wash their hands well and often.  Rest at home while you get better.  Take over-the-counter and prescription medicines only as told by your doctor.  Breathe slowly and deeply when you feel sick to your  stomach.  Watch your condition for any changes.  Keep all follow-up visits as told by your doctor. This is important. Contact a doctor if:  You have a headache.  You have new symptoms.  You feel sicker to your stomach.  You have a fever.  You feel light-headed or dizzy.  You throw up.  You are not able to keep fluids down. Get help right away if:  You have pain in your chest, neck, arm, or jaw.  You feel very weak or you pass out (faint).  You have throw up that is bright red or looks like coffee grounds.  You have bloody or black poop (stools), or poop that looks like tar.  You have a very bad headache, a stiff neck, or both.  You have very bad pain, cramping, or bloating in your belly.  You have a rash.  You have trouble breathing or you are breathing very quickly.  Your heart is beating very quickly.  Your skin feels cold and clammy.  You feel confused.  You have pain while peeing.  You have signs of dehydration, such as: ? Dark pee, or very little or no pee. ? Cracked lips. ? Dry mouth. ? Sunken eyes. ? Sleepiness. ? Weakness. These symptoms may be an emergency. Do not wait to see if the symptoms will go away. Get medical help right away. Call your local emergency services (911 in the [place] Do not drive yourself to  the hospital. This information is not intended to replace advice given to you by your health care provider. Make sure you discuss any questions you have with your health care provider. Document Released: 06/19/2011 Document Revised: 12/06/2015 Document Reviewed: 03/06/2015 Elsevier Interactive Patient Education  [employer].

## 2017-12-09 NOTE — Progress Notes (Signed)
Subjective:    Patient ID: [name], female    DOB: [birth year], 62 y.o.   MRN: [id number]  HPI  Pt presents to the clinic today with c/o nausea. This started this morning. She denies abdominal pain, vomiting, diarrhea, constipation or blood in her stool. She denies fever, chills or body aches. She denies urinary or vaginal symptoms. She has not tried anything OTC for her symptoms.  Review of Systems      Past Medical History:  Diagnosis Date  . Asthma   . Diabetes mellitus without complication ([place]) 9-32-67   hx. NIDDM-dx. 3 weeks ago  . Hyperlipidemia    hx  . Seasonal allergies   . Ulcerative colitis ([place])     Current Outpatient Medications  Medication Sig Dispense Refill  . albuterol (PROVENTIL HFA;VENTOLIN HFA) 108 (90 Base) MCG/ACT inhaler Inhale 2 puffs into the lungs every 6 (six) hours as needed for wheezing or shortness of breath. 1 Inhaler 0  . glipiZIDE (GLUCOTROL) 10 MG tablet Take 1 tablet (10 mg total) by mouth 2 (two) times daily before a meal. (Patient taking differently: Take 5 mg by mouth 2 (two) times daily before a meal. ) 60 tablet 3  . sertraline (ZOLOFT) 25 MG tablet Take 1 tablet (25 mg total) by mouth at bedtime. 30 tablet 2   No current facility-administered medications for this visit.     Allergies  Allergen Reactions  . Azithromycin Itching    Muscle aches  . Erythromycin Nausea And Vomiting  . Penicillins Nausea And Vomiting    Has patient had a PCN reaction causing immediate rash, facial/tongue/throat swelling, SOB or lightheadedness with hypotension: Yes Has patient had a PCN reaction causing severe rash involving mucus membranes or skin necrosis: No Has patient had a PCN reaction that required hospitalization No Has patient had a PCN reaction occurring within the last 10 years: No If all of the above answers are "NO", then may proceed with Cephalosporin use.   . Tetracycline Nausea And Vomiting    Family History  Problem  Relation Age of Onset  . Hypertension Mother   . Diabetes Mother   . Cancer Father        lung ca  . Seizures Daughter        28yr    Social History   Socioeconomic History  . Marital status: Legally Separated    Spouse name: Not on file  . Number of children: Not on file  . Years of education: Not on file  . Highest education level: Not on file  Occupational History  . Not on file  Social Needs  . Financial resource strain: Not on file  . Food insecurity:    Worry: Not on file    Inability: Not on file  . Transportation needs:    Medical: Not on file    Non-medical: Not on file  Tobacco Use  . Smoking status: Never Smoker  . Smokeless tobacco: Never Used  Substance and Sexual Activity  . Alcohol use: Yes    Alcohol/week: 0.0 oz    Comment: occasional  . Drug use: No  . Sexual activity: Not Currently  Lifestyle  . Physical activity:    Days per week: Not on file    Minutes per session: Not on file  . Stress: Not on file  Relationships  . Social connections:    Talks on phone: Not on file    Gets together: Not on file    Attends religious service:  Not on file    Active member of club or organization: Not on file    Attends meetings of clubs or organizations: Not on file    Relationship status: Not on file  . Intimate partner violence:    Fear of current or ex partner: Not on file    Emotionally abused: Not on file    Physically abused: Not on file    Forced sexual activity: Not on file  Other Topics Concern  . Not on file  Social History Narrative  . Not on file     Constitutional: Denies fever, malaise, fatigue, headache or abrupt weight changes.  Respiratory: Denies difficulty breathing, shortness of breath, cough or sputum production.   Cardiovascular: Denies chest pain, chest tightness, palpitations or swelling in the hands or feet.  Gastrointestinal: Pt reports nausea. Denies abdominal pain, bloating, constipation, diarrhea or blood in the stool.    GU: Denies urgency, frequency, pain with urination, burning sensation, blood in urine, odor or discharge.   No other specific complaints in a complete review of systems (except as listed in HPI above).  Objective:   Physical Exam   BP 122/86   Pulse 88   Temp 98 F (36.7 C) (Oral)   Wt 151 lb (68.5 kg)   BMI 27.62 kg/m  Wt Readings from Last 3 Encounters:  12/09/17 151 lb (68.5 kg)  10/06/17 153 lb (69.4 kg)  10/01/17 149 lb (67.6 kg)    General: Appears her stated age, in NAD. Abdomen: Soft and nontender. Normal bowel sounds. No distention or masses noted.   BMET    Component Value Date/Time   NA 138 09/25/2017 0117   NA 138 06/23/2014 0503   K 3.6 09/25/2017 0117   K 4.2 06/23/2014 0503   CL 105 09/25/2017 0117   CL 104 06/23/2014 0503   CO2 24 09/25/2017 0117   CO2 27 06/23/2014 0503   GLUCOSE 134 (H) 09/25/2017 0117   GLUCOSE 246 (H) 06/23/2014 0503   BUN 12 09/25/2017 0117   BUN 17 06/23/2014 0503   CREATININE 0.70 09/25/2017 0117   CREATININE 0.86 06/23/2014 0503   CALCIUM 8.8 (L) 09/25/2017 0117   CALCIUM 8.7 06/23/2014 0503   GFRNONAA >60 09/25/2017 0117   GFRNONAA >60 06/23/2014 0503   GFRAA >60 09/25/2017 0117   GFRAA >60 06/23/2014 0503    Lipid Panel     Component Value Date/Time   CHOL 223 (H) 11/07/2014 1651   CHOL 211 (H) 06/22/2014 1432   TRIG 385.0 (H) 11/07/2014 1651   TRIG 473 (H) 06/22/2014 1432   HDL 43.60 11/07/2014 1651   HDL 42 06/22/2014 1432   CHOLHDL 5 11/07/2014 1651   VLDL 77.0 (H) 11/07/2014 1651   VLDL SEE COMMENT 06/22/2014 1432   LDLCALC SEE COMMENT 06/22/2014 1432    CBC    Component Value Date/Time   WBC 4.0 09/25/2017 0117   RBC 4.39 09/25/2017 0117   HGB 12.7 09/25/2017 0117   HGB 12.2 06/23/2014 0503   HCT 37.9 09/25/2017 0117   HCT 37.2 06/23/2014 0503   PLT 212 09/25/2017 0117   PLT 251 06/23/2014 0503   MCV 86.4 09/25/2017 0117   MCV 89 06/23/2014 0503   MCH 28.8 09/25/2017 0117   MCHC 33.4  09/25/2017 0117   RDW 13.4 09/25/2017 0117   RDW 13.6 06/23/2014 0503   LYMPHSABS 2.0 08/05/2017 0751   LYMPHSABS 1.8 06/23/2014 0503   MONOABS 0.4 08/05/2017 0751   MONOABS 0.4 06/23/2014 0503  EOSABS 0.1 08/05/2017 0751   EOSABS 0.0 06/23/2014 0503   BASOSABS 0.0 08/05/2017 0751   BASOSABS 0.0 06/23/2014 0503    Hgb A1C Lab Results  Component Value Date   HGBA1C 9.3 (H) 04/28/2017           Assessment & Plan:   Nausea:  Unable to identify cause No indication for labwork, urinalysis Encouraged clear fluids, advance to a bland diet as tolerated eRx for Zofran 4 mg Q8H prn Work note provided  Return precautions discussed Webb Silversmith, NP

## 2018-02-17 ENCOUNTER — Emergency Department
Admission: EM | Admit: 2018-02-17 | Discharge: 2018-02-18 | Disposition: A | Discharge status: Other Acute Inpatient Hospital | Payer: BLUE CROSS/BLUE SHIELD | Attending: Emergency Medicine | Admitting: Emergency Medicine

## 2018-02-17 DIAGNOSIS — E119 Type 2 diabetes mellitus without complications: Secondary | ICD-10-CM | POA: Insufficient documentation

## 2018-02-17 DIAGNOSIS — R4781 Slurred speech: Secondary | ICD-10-CM | POA: Diagnosis not present

## 2018-02-17 DIAGNOSIS — I639 Cerebral infarction, unspecified: Secondary | ICD-10-CM | POA: Diagnosis not present

## 2018-02-17 DIAGNOSIS — J45909 Unspecified asthma, uncomplicated: Secondary | ICD-10-CM | POA: Diagnosis not present

## 2018-02-17 DIAGNOSIS — Z79899 Other long term (current) drug therapy: Secondary | ICD-10-CM | POA: Diagnosis not present

## 2018-02-17 DIAGNOSIS — Z7984 Long term (current) use of oral hypoglycemic drugs: Secondary | ICD-10-CM | POA: Insufficient documentation

## 2018-02-17 DIAGNOSIS — R531 Weakness: Secondary | ICD-10-CM | POA: Diagnosis not present

## 2018-02-17 NOTE — ED Provider Notes (Signed)
[facility] Emergency Department Provider Note    First MD Initiated Contact with Patient 02/17/18 2353     (approximate)  I have reviewed the triage vital signs and the nursing notes.   HISTORY  Chief Complaint Code Stroke    HPI [name] is a 62 y.o. female with below list of chronic medical conditions including hyperlipidemia to the emergency department with acute onset of slurred speech and left arm leg weakness which began at 11 PM tonight.  Patient states that she was on the phone and she noted that her speech was slurred.  Patient states she was unable to hold her fall with her left hand.  Past Medical History:  Diagnosis Date  . Asthma   . Diabetes mellitus without complication ([place]) 9-89-21   hx. NIDDM-dx. 3 weeks ago  . Hyperlipidemia    hx  . Seasonal allergies   . Ulcerative colitis [facility])     Patient Active Problem List   Diagnosis Date Noted  . HLD (hyperlipidemia) 08/02/2014  . Type 2 diabetes mellitus ([place]) 07/31/2014  . ULCERATIVE COLITIS 06/26/2008    Past Surgical History:  Procedure Laterality Date  . BUNIONECTOMY     bilateral and toe nails of big toes removed  . CESAREAN SECTION  1997/2006  . CHOLECYSTECTOMY N/A 12/01/2012   Procedure: LAPAROSCOPIC CHOLECYSTECTOMY;  Surgeon: Stark Klein, MD;  Location: [facility] ORS;  Service: General;  Laterality: N/A;  . COLONOSCOPY W/ POLYPECTOMY    . EYE SURGERY Bilateral 2013  . TONSILLECTOMY      Prior to Admission medications   Medication Sig Start Date End Date Taking? Authorizing Provider  albuterol (PROVENTIL HFA;VENTOLIN HFA) 108 (90 Base) MCG/ACT inhaler Inhale 2 puffs into the lungs every 6 (six) hours as needed for wheezing or shortness of breath. 02/26/16   Jearld Fenton, NP  glipiZIDE (GLUCOTROL) 10 MG tablet Take 1 tablet (10 mg total) by mouth 2 (two) times daily before a meal. Patient taking differently: Take 5 mg by mouth 2 (two) times daily before a meal.   05/04/17   Baity, Coralie Keens, NP  ondansetron (ZOFRAN) 4 MG tablet Take 1 tablet (4 mg total) by mouth every 8 (eight) hours as needed. 12/09/17   Jearld Fenton, NP  sertraline (ZOLOFT) 25 MG tablet Take 1 tablet (25 mg total) by mouth at bedtime. 04/28/17   Jearld Fenton, NP    Allergies Azithromycin; Erythromycin; Penicillins; and Tetracycline  Family History  Problem Relation Age of Onset  . Hypertension Mother   . Diabetes Mother   . Cancer Father        lung ca  . Seizures Daughter        10yr    Social History Social History   Tobacco Use  . Smoking status: Never Smoker  . Smokeless tobacco: Never Used  Substance Use Topics  . Alcohol use: Yes    Alcohol/week: 0.0 standard drinks    Comment: occasional  . Drug use: No    Review of Systems Constitutional: No fever/chills Eyes: No visual changes. ENT: No sore throat. Cardiovascular: Denies chest pain. Respiratory: Denies shortness of breath. Gastrointestinal: No abdominal pain.  No nausea, no vomiting.  No diarrhea.  No constipation. Genitourinary: Negative for dysuria. Musculoskeletal: Negative for neck pain.  Negative for back pain. Integumentary: Negative for rash. Neurological: Positive for slurred speech, positive for left arm and leg weakness.   ____________________________________________   PHYSICAL EXAM:  VITAL SIGNS: ED Triage Vitals  Enc Vitals Group     BP      Pulse      Resp      Temp      Temp src      SpO2      Weight      Height      Head Circumference      Peak Flow      Pain Score      Pain Loc      Pain Edu?      Excl. in [place]?     Constitutional: Alert and oriented. Eyes: Conjunctivae are normal. PERRL. EOMI. Head: Atraumatic. Mouth/Throat: Mucous membranes are moist.  Oropharynx non-erythematous. Neck: No stridor.  No meningeal signs.   Cardiovascular: Normal rate, regular rhythm. Good peripheral circulation. Grossly normal heart sounds. Respiratory: Normal respiratory  effort.  No retractions. Lungs CTAB. Gastrointestinal: Soft and nontender. No distention.  Musculoskeletal: No lower extremity tenderness nor edema.   Neurologic: Slurred speech, left pronator drift with arm going back to the bed.  Left lower extremity weakness with leg only raise to 1 inch from the bed with return to the bed Skin:  Skin is warm, dry and intact. No rash noted. Psychiatric: Mood and affect are normal. Speech and behavior are normal.  ____________________________________________   LABS (all labs ordered are listed, but only abnormal results are displayed)  Labs Reviewed  COMPREHENSIVE METABOLIC PANEL - Abnormal; Notable for the following components:      Result Value   Potassium 3.2 (*)    Glucose, Bld 226 (*)    Calcium 8.7 (*)    AST 44 (*)    All other components within normal limits  URINE DRUG SCREEN, QUALITATIVE ([facility] ONLY) - Abnormal; Notable for the following components:   Benzodiazepine, Ur Scrn TEST NOT PERFORMED, REAGENT NOT AVAILABLE (*)    All other components within normal limits  URINALYSIS, ROUTINE W REFLEX MICROSCOPIC - Abnormal; Notable for the following components:   Color, Urine COLORLESS (*)    APPearance CLEAR (*)    Glucose, UA 150 (*)    Hgb urine dipstick SMALL (*)    Bacteria, UA RARE (*)    All other components within normal limits  GLUCOSE, CAPILLARY - Abnormal; Notable for the following components:   Glucose-Capillary 221 (*)    All other components within normal limits  ETHANOL  PROTIME-INR  APTT  CBC  DIFFERENTIAL  TROPONIN I  TYPE AND SCREEN   ____________________________________________  EKG  ED ECG REPORT I, [place] Olie Dibert, the attending physician, personally viewed and interpreted this ECG.   Date: 02/18/2018  EKG Time: 12:10 AM  Rate: 84  Rhythm: Normal sinus rhythm  Axis: Normal  Intervals: Normal  ST&T Change: None  ____________________________________________  RADIOLOGY I, [place] Camryn Lampson, personally  viewed and evaluated these images (plain radiographs) as part of my medical decision making, as well as reviewing the written report by the radiologist.  ED MD interpretation: CT head revealed no acute intracranial findings CT angiogram likewise revealed no large vessel occlusion.  Official radiology report(s): Ct Angio Head W Or Wo Contrast  Result Date: 02/18/2018 CLINICAL DATA:  Slurred speech and left-sided weakness EXAM: CT ANGIOGRAPHY HEAD TECHNIQUE: Multidetector CT imaging of the head was performed using the standard protocol during bolus administration of intravenous contrast. Multiplanar CT image reconstructions and MIPs were obtained to evaluate the vascular anatomy. CONTRAST:  64m ISOVUE-370 IOPAMIDOL (ISOVUE-370) INJECTION 76% COMPARISON:  Head CT 02/18/2018 FINDINGS: CTA NECK FINDINGS  AORTIC ARCH: There is no calcific atherosclerosis of the aortic arch. There is no aneurysm, dissection or hemodynamically significant stenosis of the visualized ascending aorta and aortic arch. Conventional 3 vessel aortic branching pattern. The visualized proximal subclavian arteries are widely patent. RIGHT CAROTID SYSTEM: --Common carotid artery: Widely patent origin without common carotid artery dissection or aneurysm. --Internal carotid artery: No dissection, occlusion or aneurysm. No hemodynamically significant stenosis. --External carotid artery: No acute abnormality. LEFT CAROTID SYSTEM: --Common carotid artery: Widely patent origin without common carotid artery dissection or aneurysm. --Internal carotid artery:No dissection, occlusion or aneurysm. No hemodynamically significant stenosis. --External carotid artery: No acute abnormality. VERTEBRAL ARTERIES: Codominant configuration. Both origins are normal. No dissection, occlusion or flow-limiting stenosis to the vertebrobasilar confluence. SKELETON: There is no bony spinal canal stenosis. No lytic or blastic lesion. OTHER NECK: Normal pharynx, larynx and  major salivary glands. No cervical lymphadenopathy. Unremarkable thyroid gland. UPPER CHEST: No pneumothorax or pleural effusion. No nodules or masses. CTA HEAD FINDINGS ANTERIOR CIRCULATION: --Intracranial internal carotid arteries: Normal. --Anterior cerebral arteries: Normal. Both A1 segments are present. Patent anterior communicating artery. --Middle cerebral arteries: Normal. --Posterior communicating arteries: Present bilaterally. POSTERIOR CIRCULATION: --Basilar artery: Normal. --Posterior cerebral arteries: Normal. --Superior cerebellar arteries: Normal. --Inferior cerebellar arteries: Normal anterior and posterior inferior cerebellar arteries. VENOUS SINUSES: As permitted by contrast timing, patent. ANATOMIC VARIANTS: None DELAYED PHASE: No parenchymal contrast enhancement. Review of the MIP images confirms the above findings. IMPRESSION: No emergent large vessel occlusion. Normal CTA of the head and neck Electronically Signed   By: Ulyses Jarred M.D.   On: 02/18/2018 03:21   Ct Angio Neck W Or Wo Contrast  Result Date: 02/18/2018 CLINICAL DATA:  Slurred speech and left-sided weakness EXAM: CT ANGIOGRAPHY HEAD TECHNIQUE: Multidetector CT imaging of the head was performed using the standard protocol during bolus administration of intravenous contrast. Multiplanar CT image reconstructions and MIPs were obtained to evaluate the vascular anatomy. CONTRAST:  57m ISOVUE-370 IOPAMIDOL (ISOVUE-370) INJECTION 76% COMPARISON:  Head CT 02/18/2018 FINDINGS: CTA NECK FINDINGS AORTIC ARCH: There is no calcific atherosclerosis of the aortic arch. There is no aneurysm, dissection or hemodynamically significant stenosis of the visualized ascending aorta and aortic arch. Conventional 3 vessel aortic branching pattern. The visualized proximal subclavian arteries are widely patent. RIGHT CAROTID SYSTEM: --Common carotid artery: Widely patent origin without common carotid artery dissection or aneurysm. --Internal carotid  artery: No dissection, occlusion or aneurysm. No hemodynamically significant stenosis. --External carotid artery: No acute abnormality. LEFT CAROTID SYSTEM: --Common carotid artery: Widely patent origin without common carotid artery dissection or aneurysm. --Internal carotid artery:No dissection, occlusion or aneurysm. No hemodynamically significant stenosis. --External carotid artery: No acute abnormality. VERTEBRAL ARTERIES: Codominant configuration. Both origins are normal. No dissection, occlusion or flow-limiting stenosis to the vertebrobasilar confluence. SKELETON: There is no bony spinal canal stenosis. No lytic or blastic lesion. OTHER NECK: Normal pharynx, larynx and major salivary glands. No cervical lymphadenopathy. Unremarkable thyroid gland. UPPER CHEST: No pneumothorax or pleural effusion. No nodules or masses. CTA HEAD FINDINGS ANTERIOR CIRCULATION: --Intracranial internal carotid arteries: Normal. --Anterior cerebral arteries: Normal. Both A1 segments are present. Patent anterior communicating artery. --Middle cerebral arteries: Normal. --Posterior communicating arteries: Present bilaterally. POSTERIOR CIRCULATION: --Basilar artery: Normal. --Posterior cerebral arteries: Normal. --Superior cerebellar arteries: Normal. --Inferior cerebellar arteries: Normal anterior and posterior inferior cerebellar arteries. VENOUS SINUSES: As permitted by contrast timing, patent. ANATOMIC VARIANTS: None DELAYED PHASE: No parenchymal contrast enhancement. Review of the MIP images confirms the above findings. IMPRESSION: No emergent  large vessel occlusion. Normal CTA of the head and neck Electronically Signed   By: Ulyses Jarred M.D.   On: 02/18/2018 03:21   Ct Head Code Stroke Wo Contrast  Result Date: 02/18/2018 CLINICAL DATA:  Code stroke. Sudden onset left-sided weakness and slurred speech. EXAM: CT HEAD WITHOUT CONTRAST TECHNIQUE: Contiguous axial images were obtained from the base of the skull through the  vertex without intravenous contrast. COMPARISON:  None. FINDINGS: Brain: There is no mass, hemorrhage or extra-axial collection. The size and configuration of the ventricles and extra-axial CSF spaces are normal. There is no acute or chronic infarction. The brain parenchyma is normal. Vascular: No abnormal hyperdensity of the major intracranial arteries or dural venous sinuses. No intracranial atherosclerosis. Skull: The visualized skull base, calvarium and extracranial soft tissues are normal. Sinuses/Orbits: No fluid levels or advanced mucosal thickening of the visualized paranasal sinuses. No mastoid or middle ear effusion. The orbits are normal. ASPECTS [facility] Stroke Program Early CT Score) - Ganglionic level infarction (caudate, lentiform nuclei, internal capsule, insula, M1-M3 cortex): 7 - Supraganglionic infarction (M4-M6 cortex): 3 Total score (0-10 with 10 being normal): 10 IMPRESSION: 1. No hemorrhage or mass effect. 2. ASPECTS is 10. These results were relayed by telephone at the time of interpretation on 02/18/2018 at 12:15 am to Dr. Marjean Donna via the patient's nurse. Electronically Signed   By: Ulyses Jarred M.D.   On: 02/18/2018 00:18    _________________  .Critical Care Performed by: Gregor Hams, MD Authorized by: Gregor Hams, MD   Critical care provider statement:    Critical care time (minutes):  60   Critical care time was exclusive of:  Separately billable procedures and treating other patients   Critical care was necessary to treat or prevent imminent or life-threatening deterioration of the following conditions:  CNS failure or compromise   Critical care was time spent personally by me on the following activities:  Development of treatment plan with patient or surrogate, discussions with consultants, evaluation of patient's response to treatment, examination of patient, obtaining history from patient or surrogate, ordering and performing treatments and  interventions, ordering and review of laboratory studies, ordering and review of radiographic studies, pulse oximetry, re-evaluation of patient's condition and review of old charts   I assumed direction of critical care for this patient from another provider in my specialty: no       ____________________   INITIAL IMPRESSION / ASSESSMENT AND PLAN / ED COURSE  As part of my medical decision making, I reviewed the following data within the electronic MEDICAL RECORD NUMBER   62 year old female presented with above-stated history and physical exam consistent with acute CVA and NIH stroke scale greater than 2.  Stroke protocol initiated initial CT scan revealed no evidence of ischemia or infarction.  Patient evaluated by Dr. Derrill Kay tele-neurology who ordered TPA.  Patient reevaluated multiple times by me with noted improvement in dysarthria and left upper and lower extremity muscle weakness.  Patient discussed with Dr. Lorraine Lax neurologist on-call at [facility] who accepted the patient in transfer.    ____________________________________________  FINAL CLINICAL IMPRESSION(S) / ED DIAGNOSES  Final diagnoses:  Cerebrovascular accident (CVA), unspecified mechanism ([place])     MEDICATIONS GIVEN DURING THIS VISIT:  Medications  labetalol (NORMODYNE,TRANDATE) 5 MG/ML injection (has no administration in time range)  alteplase (ACTIVASE) 1 mg/mL infusion 59.3 mg (0 mg/kg  65.9 kg Intravenous Stopped 02/18/18 0325)    Followed by  0.9 %  sodium chloride infusion (50  mLs Intravenous New Bag/Given 02/18/18 0145)  iopamidol (ISOVUE-370) 76 % injection 75 mL (75 mLs Intravenous Contrast Given 02/18/18 0303)     ED Discharge Orders    None       Note:  This document was prepared using Dragon voice recognition software and may include unintentional dictation errors.    Gregor Hams, MD 02/18/18 [phone]

## 2018-02-18 ENCOUNTER — Encounter (HOSPITAL_COMMUNITY): Payer: Self-pay | Admitting: *Deleted

## 2018-02-18 ENCOUNTER — Inpatient Hospital Stay (HOSPITAL_COMMUNITY)
Admission: EM | Admit: 2018-02-18 | Discharge: 2018-02-23 | DRG: 065 | Disposition: A | Discharge status: Rehabilitation Facility | Payer: BLUE CROSS/BLUE SHIELD | Source: Other Acute Inpatient Hospital | Attending: Neurology | Admitting: Neurology

## 2018-02-18 ENCOUNTER — Other Ambulatory Visit: Payer: Self-pay

## 2018-02-18 ENCOUNTER — Inpatient Hospital Stay (HOSPITAL_COMMUNITY): Payer: BLUE CROSS/BLUE SHIELD

## 2018-02-18 ENCOUNTER — Emergency Department: Payer: BLUE CROSS/BLUE SHIELD

## 2018-02-18 DIAGNOSIS — R402142 Coma scale, eyes open, spontaneous, at arrival to emergency department: Secondary | ICD-10-CM | POA: Diagnosis present

## 2018-02-18 DIAGNOSIS — E119 Type 2 diabetes mellitus without complications: Secondary | ICD-10-CM

## 2018-02-18 DIAGNOSIS — Z7984 Long term (current) use of oral hypoglycemic drugs: Secondary | ICD-10-CM | POA: Diagnosis not present

## 2018-02-18 DIAGNOSIS — I63549 Cerebral infarction due to unspecified occlusion or stenosis of unspecified cerebellar artery: Secondary | ICD-10-CM | POA: Diagnosis not present

## 2018-02-18 DIAGNOSIS — E785 Hyperlipidemia, unspecified: Secondary | ICD-10-CM | POA: Diagnosis present

## 2018-02-18 DIAGNOSIS — R29705 NIHSS score 5: Secondary | ICD-10-CM | POA: Diagnosis present

## 2018-02-18 DIAGNOSIS — F329 Major depressive disorder, single episode, unspecified: Secondary | ICD-10-CM | POA: Diagnosis not present

## 2018-02-18 DIAGNOSIS — R739 Hyperglycemia, unspecified: Secondary | ICD-10-CM

## 2018-02-18 DIAGNOSIS — E1151 Type 2 diabetes mellitus with diabetic peripheral angiopathy without gangrene: Secondary | ICD-10-CM | POA: Diagnosis present

## 2018-02-18 DIAGNOSIS — E1142 Type 2 diabetes mellitus with diabetic polyneuropathy: Secondary | ICD-10-CM | POA: Diagnosis not present

## 2018-02-18 DIAGNOSIS — Z21 Asymptomatic human immunodeficiency virus [HIV] infection status: Secondary | ICD-10-CM | POA: Diagnosis present

## 2018-02-18 DIAGNOSIS — Z880 Allergy status to penicillin: Secondary | ICD-10-CM

## 2018-02-18 DIAGNOSIS — Z23 Encounter for immunization: Secondary | ICD-10-CM

## 2018-02-18 DIAGNOSIS — Z833 Family history of diabetes mellitus: Secondary | ICD-10-CM | POA: Diagnosis not present

## 2018-02-18 DIAGNOSIS — Z8249 Family history of ischemic heart disease and other diseases of the circulatory system: Secondary | ICD-10-CM

## 2018-02-18 DIAGNOSIS — I6389 Other cerebral infarction: Secondary | ICD-10-CM | POA: Diagnosis not present

## 2018-02-18 DIAGNOSIS — G8194 Hemiplegia, unspecified affecting left nondominant side: Secondary | ICD-10-CM | POA: Diagnosis not present

## 2018-02-18 DIAGNOSIS — R29810 Facial weakness: Secondary | ICD-10-CM | POA: Diagnosis present

## 2018-02-18 DIAGNOSIS — E11649 Type 2 diabetes mellitus with hypoglycemia without coma: Secondary | ICD-10-CM | POA: Diagnosis not present

## 2018-02-18 DIAGNOSIS — E782 Mixed hyperlipidemia: Secondary | ICD-10-CM | POA: Diagnosis not present

## 2018-02-18 DIAGNOSIS — B20 Human immunodeficiency virus [HIV] disease: Secondary | ICD-10-CM | POA: Diagnosis not present

## 2018-02-18 DIAGNOSIS — I69354 Hemiplegia and hemiparesis following cerebral infarction affecting left non-dominant side: Secondary | ICD-10-CM | POA: Diagnosis not present

## 2018-02-18 DIAGNOSIS — E1169 Type 2 diabetes mellitus with other specified complication: Secondary | ICD-10-CM | POA: Diagnosis present

## 2018-02-18 DIAGNOSIS — R402252 Coma scale, best verbal response, oriented, at arrival to emergency department: Secondary | ICD-10-CM | POA: Diagnosis present

## 2018-02-18 DIAGNOSIS — I63311 Cerebral infarction due to thrombosis of right middle cerebral artery: Secondary | ICD-10-CM

## 2018-02-18 DIAGNOSIS — R402362 Coma scale, best motor response, obeys commands, at arrival to emergency department: Secondary | ICD-10-CM | POA: Diagnosis present

## 2018-02-18 DIAGNOSIS — R471 Dysarthria and anarthria: Secondary | ICD-10-CM | POA: Diagnosis present

## 2018-02-18 DIAGNOSIS — G43909 Migraine, unspecified, not intractable, without status migrainosus: Secondary | ICD-10-CM | POA: Diagnosis present

## 2018-02-18 DIAGNOSIS — I69328 Other speech and language deficits following cerebral infarction: Secondary | ICD-10-CM | POA: Diagnosis not present

## 2018-02-18 DIAGNOSIS — Z9282 Status post administration of tPA (rtPA) in a different facility within the last 24 hours prior to admission to current facility: Secondary | ICD-10-CM

## 2018-02-18 DIAGNOSIS — E1165 Type 2 diabetes mellitus with hyperglycemia: Secondary | ICD-10-CM

## 2018-02-18 DIAGNOSIS — K5900 Constipation, unspecified: Secondary | ICD-10-CM | POA: Diagnosis not present

## 2018-02-18 DIAGNOSIS — Z79899 Other long term (current) drug therapy: Secondary | ICD-10-CM | POA: Diagnosis not present

## 2018-02-18 DIAGNOSIS — Z881 Allergy status to other antibiotic agents status: Secondary | ICD-10-CM | POA: Diagnosis not present

## 2018-02-18 DIAGNOSIS — G8114 Spastic hemiplegia affecting left nondominant side: Secondary | ICD-10-CM | POA: Diagnosis not present

## 2018-02-18 DIAGNOSIS — I639 Cerebral infarction, unspecified: Secondary | ICD-10-CM | POA: Diagnosis not present

## 2018-02-18 DIAGNOSIS — J45909 Unspecified asthma, uncomplicated: Secondary | ICD-10-CM | POA: Diagnosis present

## 2018-02-18 DIAGNOSIS — E1159 Type 2 diabetes mellitus with other circulatory complications: Secondary | ICD-10-CM | POA: Diagnosis not present

## 2018-02-18 DIAGNOSIS — E876 Hypokalemia: Secondary | ICD-10-CM | POA: Diagnosis not present

## 2018-02-18 DIAGNOSIS — IMO0002 Reserved for concepts with insufficient information to code with codable children: Secondary | ICD-10-CM

## 2018-02-18 DIAGNOSIS — K5190 Ulcerative colitis, unspecified, without complications: Secondary | ICD-10-CM | POA: Diagnosis not present

## 2018-02-18 DIAGNOSIS — G43009 Migraine without aura, not intractable, without status migrainosus: Secondary | ICD-10-CM | POA: Diagnosis not present

## 2018-02-18 DIAGNOSIS — E118 Type 2 diabetes mellitus with unspecified complications: Secondary | ICD-10-CM

## 2018-02-18 DIAGNOSIS — I10 Essential (primary) hypertension: Secondary | ICD-10-CM | POA: Diagnosis not present

## 2018-02-18 DIAGNOSIS — R4781 Slurred speech: Secondary | ICD-10-CM | POA: Diagnosis not present

## 2018-02-18 HISTORY — DX: Cerebral infarction, unspecified: I63.9

## 2018-02-18 LAB — COMPREHENSIVE METABOLIC PANEL
ALBUMIN: 3.9 g/dL (ref 3.5–5.0)
ALK PHOS: 105 U/L (ref 38–126)
ALT: 30 U/L (ref 0–44)
AST: 44 U/L — AB (ref 15–41)
Anion gap: 8 (ref 5–15)
BILIRUBIN TOTAL: 0.6 mg/dL (ref 0.3–1.2)
BUN: 14 mg/dL (ref 8–23)
CO2: 26 mmol/L (ref 22–32)
CREATININE: 0.76 mg/dL (ref 0.44–1.00)
Calcium: 8.7 mg/dL — ABNORMAL LOW (ref 8.9–10.3)
Chloride: 102 mmol/L (ref 98–111)
GFR calc Af Amer: >60 mL/min (ref >60)
GFR calc non Af Amer: >60 mL/min (ref >60)
GLUCOSE: 226 mg/dL — AB (ref 70–99)
POTASSIUM: 3.2 mmol/L — AB (ref 3.5–5.1)
Sodium: 136 mmol/L (ref 135–145)
TOTAL PROTEIN: 7.7 g/dL (ref 6.5–8.1)

## 2018-02-18 LAB — URINALYSIS, ROUTINE W REFLEX MICROSCOPIC
Bilirubin Urine: NEGATIVE
Glucose, UA: 150 mg/dL — AB
KETONES UR: NEGATIVE mg/dL
Leukocytes, UA: NEGATIVE
Nitrite: NEGATIVE
PH: 7 (ref 5.0–8.0)
PROTEIN: NEGATIVE mg/dL
Specific Gravity, Urine: 1.006 (ref 1.005–1.030)

## 2018-02-18 LAB — ECHOCARDIOGRAM COMPLETE
HEIGHTINCHES: 62 in
Weight: 2388.02 oz

## 2018-02-18 LAB — GLUCOSE, CAPILLARY
GLUCOSE-CAPILLARY: 110 mg/dL — AB (ref 70–99)
Glucose-Capillary: 101 mg/dL — ABNORMAL HIGH (ref 70–99)
Glucose-Capillary: 129 mg/dL — ABNORMAL HIGH (ref 70–99)
Glucose-Capillary: 143 mg/dL — ABNORMAL HIGH (ref 70–99)
Glucose-Capillary: 221 mg/dL — ABNORMAL HIGH (ref 70–99)

## 2018-02-18 LAB — CBC
HEMATOCRIT: 35.6 % (ref 35.0–47.0)
HEMOGLOBIN: 12 g/dL (ref 12.0–16.0)
MCH: 28.9 pg (ref 26.0–34.0)
MCHC: 33.8 g/dL (ref 32.0–36.0)
MCV: 85.6 fL (ref 80.0–100.0)
Platelets: 209 10*3/uL (ref 150–440)
RBC: 4.16 MIL/uL (ref 3.80–5.20)
RDW: 13.2 % (ref 11.5–14.5)
WBC: 5.2 10*3/uL (ref 3.6–11.0)

## 2018-02-18 LAB — PROTIME-INR
INR: 0.88
Prothrombin Time: 11.9 seconds (ref 11.4–15.2)

## 2018-02-18 LAB — URINE DRUG SCREEN, QUALITATIVE (ARMC ONLY)
AMPHETAMINES, UR SCREEN: NOT DETECTED
Barbiturates, Ur Screen: NOT DETECTED
Cannabinoid 50 Ng, Ur ~~LOC~~: NOT DETECTED
Cocaine Metabolite,Ur ~~LOC~~: NOT DETECTED
MDMA (Ecstasy)Ur Screen: NOT DETECTED
Methadone Scn, Ur: NOT DETECTED
OPIATE, UR SCREEN: NOT DETECTED
PHENCYCLIDINE (PCP) UR S: NOT DETECTED
Tricyclic, Ur Screen: NOT DETECTED

## 2018-02-18 LAB — DIFFERENTIAL
BASOS ABS: 0 10*3/uL (ref 0–0.1)
Basophils Relative: 0 %
EOS ABS: 0 10*3/uL (ref 0–0.7)
Eosinophils Relative: 1 %
LYMPHS ABS: 2.2 10*3/uL (ref 1.0–3.6)
LYMPHS PCT: 42 %
Monocytes Absolute: 0.6 10*3/uL (ref 0.2–0.9)
Monocytes Relative: 12 %
NEUTROS ABS: 2.4 10*3/uL (ref 1.4–6.5)
NEUTROS PCT: 45 %

## 2018-02-18 LAB — TYPE AND SCREEN
ABO/RH(D): A POS
ABO/RH(D): A POS
ANTIBODY SCREEN: NEGATIVE
Antibody Screen: NEGATIVE

## 2018-02-18 LAB — MRSA PCR SCREENING: MRSA BY PCR: NEGATIVE

## 2018-02-18 LAB — TROPONIN I: Troponin I: <0.03 ng/mL (ref <0.03)

## 2018-02-18 LAB — ETHANOL: Alcohol, Ethyl (B): <10 mg/dL (ref <10)

## 2018-02-18 LAB — APTT: APTT: 25 s (ref 24–36)

## 2018-02-18 MED ORDER — POTASSIUM CHLORIDE 10 MEQ/100ML IV SOLN
10.0000 meq | INTRAVENOUS | Status: DC
  Administered 2018-02-18: 10 meq via INTRAVENOUS
  Filled 2018-02-18 (×9): qty 100

## 2018-02-18 MED ORDER — ACETAMINOPHEN 160 MG/5ML PO SOLN: 650.0000 mg | ORAL | Status: DC | PRN

## 2018-02-18 MED ORDER — ALTEPLASE 100 MG IV SOLR
INTRAVENOUS | Status: AC
  Administered 2018-02-18: 59.3 mg via INTRAVENOUS
  Filled 2018-02-18: qty 100

## 2018-02-18 MED ORDER — IOPAMIDOL (ISOVUE-370) INJECTION 76%
75.0000 mL | Freq: Once | INTRAVENOUS | Status: AC | PRN
  Administered 2018-02-18: 75 mL via INTRAVENOUS

## 2018-02-18 MED ORDER — SENNOSIDES-DOCUSATE SODIUM 8.6-50 MG PO TABS: 1.0000 | ORAL_TABLET | Freq: Every evening | ORAL | Status: DC | PRN

## 2018-02-18 MED ORDER — ALTEPLASE (STROKE) FULL DOSE INFUSION
0.9000 mg/kg | Freq: Once | INTRAVENOUS | Status: AC
  Administered 2018-02-18: 59.3 mg via INTRAVENOUS

## 2018-02-18 MED ORDER — INSULIN ASPART 100 UNIT/ML ~~LOC~~ SOLN
0.0000 [IU] | SUBCUTANEOUS | Status: DC
  Administered 2018-02-18 – 2018-02-19 (×4): 1 [IU] via SUBCUTANEOUS

## 2018-02-18 MED ORDER — SODIUM CHLORIDE 0.9 % IV SOLN
INTRAVENOUS | Status: DC
  Administered 2018-02-18: 07:00:00 via INTRAVENOUS

## 2018-02-18 MED ORDER — ALBUTEROL SULFATE (2.5 MG/3ML) 0.083% IN NEBU: 3.0000 mL | INHALATION_SOLUTION | Freq: Four times a day (QID) | RESPIRATORY_TRACT | Status: DC | PRN

## 2018-02-18 MED ORDER — POTASSIUM CHLORIDE CRYS ER 20 MEQ PO TBCR
40.0000 meq | EXTENDED_RELEASE_TABLET | ORAL | Status: AC
  Administered 2018-02-18 (×2): 40 meq via ORAL
  Filled 2018-02-18 (×2): qty 2

## 2018-02-18 MED ORDER — ACETAMINOPHEN 325 MG PO TABS
650.0000 mg | ORAL_TABLET | ORAL | Status: DC | PRN
  Administered 2018-02-18 – 2018-02-19 (×3): 650 mg via ORAL
  Filled 2018-02-18 (×3): qty 2

## 2018-02-18 MED ORDER — SODIUM CHLORIDE 0.9 % IV SOLN
50.0000 mL | Freq: Once | INTRAVENOUS | Status: AC
  Administered 2018-02-18: 50 mL via INTRAVENOUS

## 2018-02-18 MED ORDER — ACETAMINOPHEN 650 MG RE SUPP: 650.0000 mg | RECTAL | Status: DC | PRN

## 2018-02-18 MED ORDER — PNEUMOCOCCAL VAC POLYVALENT 25 MCG/0.5ML IJ INJ
0.5000 mL | INJECTION | INTRAMUSCULAR | Status: AC
  Administered 2018-02-19: 0.5 mL via INTRAMUSCULAR
  Filled 2018-02-18: qty 0.5

## 2018-02-18 MED ORDER — STROKE: EARLY STAGES OF RECOVERY BOOK
Freq: Once | Status: AC
  Administered 2018-02-21: 06:00:00
  Filled 2018-02-18: qty 1

## 2018-02-18 MED ORDER — NICARDIPINE HCL IN NACL 20-0.86 MG/200ML-% IV SOLN
INTRAVENOUS | Status: AC
  Filled 2018-02-18: qty 200

## 2018-02-18 MED ORDER — LABETALOL HCL 5 MG/ML IV SOLN
INTRAVENOUS | Status: AC
  Filled 2018-02-18: qty 4

## 2018-02-18 NOTE — Evaluation (Addendum)
Physical Therapy Evaluation Patient Details Name: [name] MRN: [id number] DOB: [birth year] Today's Date: 02/18/2018   History of Present Illness  [name] is an 62 y.o. female with past medical history of diabetes mellitus, hyperlipidemia, ulcerative colitis, asthma presents to the emergency department at [facility] as a code stroke for sudden onset slurred speech and left-sided weakness.  Clinical Impression  Pt admitted with/for s/s of stroke with left sided weakness.  Pt needing mod of 2 for basic mobility and gait.Marland Kitchen  Pt currently limited functionally due to the problems listed. ( See problems list.)   Pt will benefit from PT to maximize function and safety in order to get ready for next venue listed below.     Follow Up Recommendations CIR    Equipment Recommendations  Other (comment)(TBA)    Recommendations for Other Services Rehab consult     Precautions / Restrictions Precautions Precautions: Fall Restrictions Weight Bearing Restrictions: No      Mobility  Bed Mobility Overal bed mobility: Needs Assistance Bed Mobility: Supine to Sit     Supine to sit: Mod assist     General bed mobility comments: VCs for sequence and technique coming up on left side  Transfers Overall transfer level: Needs assistance Equipment used: 2 person hand held assist Transfers: Sit to/from Stand;Stand Pivot Transfers Sit to Stand: Min assist;+2 physical assistance Stand pivot transfers: Mod assist;+2 physical assistance          Ambulation/Gait Ambulation/Gait assistance: +2 physical assistance;Mod assist Gait Distance (Feet): 15 Feet(to BR then 30 feet with return to the chair.)   Gait Pattern/deviations: Step-to pattern;Step-through pattern   Gait velocity interpretation: <1.31 ft/sec, indicative of household ambulator General Gait Details: paretic gait on the left with difficulty clearing and advancing L LE equal to that of the R  LE  Stairs            Wheelchair Mobility    Modified Rankin (Stroke Patients Only) Modified Rankin (Stroke Patients Only) Pre-Morbid Rankin Score: No symptoms Modified Rankin: Moderately severe disability     Balance Overall balance assessment: Needs assistance Sitting-balance support: No upper extremity supported;Feet supported Sitting balance-Leahy Scale: Fair     Standing balance support: Bilateral upper extremity supported Standing balance-Leahy Scale: Poor Standing balance comment: pt with left lateral lean that increased the more activity she did                             Pertinent Vitals/Pain Pain Assessment: Faces Faces Pain Scale: Hurts little more Pain Location: left arm at rest (appears due to tone) Pain Descriptors / Indicators: Aching;Sore Pain Intervention(s): Monitored during session    Home Living Family/patient expects to be discharged to:: Private residence Living Arrangements: Spouse/significant other Available Help at Discharge: Family;Available 24 hours/day(daughter days/husband nights) Type of Home: House Home Access: Level entry     Home Layout: Two level Home Equipment: None      Prior Function Level of Independence: Independent               Hand Dominance   Dominant Hand: Right    Extremity/Trunk Assessment   Upper Extremity Assessment Upper Extremity Assessment: LUE deficits/detail LUE Deficits / Details: some isolated movement shoulder all the way to hand with diminishing movement as get more distal, increased tone intermittently LUE Coordination: decreased fine motor;decreased gross motor    Lower Extremity Assessment Lower Extremity Assessment: LLE deficits/detail LLE Deficits / Details: pt able  to isolate movement with some difficulty.  grossly >3/5 LLE Coordination: decreased fine motor       Communication   Communication: No difficulties  Cognition Arousal/Alertness: Awake/alert Behavior  During Therapy: WFL for tasks assessed/performed Overall Cognitive Status: Within Functional Limits for tasks assessed                                        General Comments      Exercises     Assessment/Plan    PT Assessment Patient needs continued PT services  PT Problem List Decreased strength;Decreased activity tolerance;Decreased mobility;Decreased balance;Decreased coordination;Pain       PT Treatment Interventions Gait training;DME instruction;Functional mobility training;Therapeutic activities;Balance training;Patient/family education;Neuromuscular re-education    PT Goals (Current goals can be found in the Care Plan section)  Acute Rehab PT Goals Patient Stated Goal: to rehab then home PT Goal Formulation: With patient Time For Goal Achievement: 03/04/18 Potential to Achieve Goals: Good    Frequency Min 4X/week   Barriers to discharge        Co-evaluation PT/OT/SLP Co-Evaluation/Treatment: Yes Reason for Co-Treatment: Complexity of the patient's impairments (multi-system involvement) PT goals addressed during session: Mobility/safety with mobility OT goals addressed during session: ADL's and self-care       AM-PAC PT "6 Clicks" Daily Activity  Outcome Measure Difficulty turning over in bed (including adjusting bedclothes, sheets and blankets)?: Unable Difficulty moving from lying on back to sitting on the side of the bed? : Unable Difficulty sitting down on and standing up from a chair with arms (e.g., wheelchair, bedside commode, etc,.)?: Unable Help needed moving to and from a bed to chair (including a wheelchair)?: A Little Help needed walking in hospital room?: A Little Help needed climbing 3-5 steps with a railing? : A Lot 6 Click Score: 11    End of Session   Activity Tolerance: Patient tolerated treatment well Patient left: in chair;with call bell/phone within reach;with chair alarm set;with family/visitor present Nurse  Communication: Mobility status PT Visit Diagnosis: Unsteadiness on feet (R26.81);Hemiplegia and hemiparesis;Pain Hemiplegia - Right/Left: Left Hemiplegia - dominant/non-dominant: Non-dominant Hemiplegia - caused by: Cerebral infarction Pain - Right/Left: Left Pain - part of body: Arm    Time: 4580-9983 PT Time Calculation (min) (ACUTE ONLY): 29 min   Charges:   PT Evaluation $PT Eval Moderate Complexity: 1 Mod          02/18/2018  Donnella Sham, PT [phone]  (pager)  Tessie Fass Rulon Abdalla 02/18/2018, 6:00 PM

## 2018-02-18 NOTE — ED Notes (Signed)
Patient taken to exam area, seen by Dr. Owens Shark, taken to [place] by this RN and ED tech. Patient had difficulty transferring from wheelchair to CT table and back d/t left sided weakness.

## 2018-02-18 NOTE — H&P (Signed)
Chief Complaint: Slurred speech, left-sided weakness  History obtained from: Patient and Chart    HPI:                                                                                                                                       [name] is an 62 y.o. female with past medical history of diabetes mellitus, hyperlipidemia, ulcerative colitis, asthma presents to the emergency department at [facility] as a code stroke for sudden onset slurred speech and left-sided weakness.  Last known normal well was around 11 PM and patient suddenly had slurred speech and left-sided weakness.  Also complained of dizziness.  She was evaluated by EDP and teleneurology specialists and received IV TPA after negative CT head.  CT angiogram was performed which showed no large vessel occlusion.  Patient was transferred to [facility] for further management.  On assessment patient continued to have weakness on the left side and dysarthria.  Pressure has remained below 076 systolic.  She is not on any aspirin at home.  Date last known well: 8.7.19 Time last known well: 11pm tPA Given: Yes NIHSS: 5 on assessment by tele-neurologist Baseline MRS 0    Past Medical History:  Diagnosis Date  . Asthma   . Diabetes mellitus without complication ([place]) 09-09-31   hx. NIDDM-dx. 3 weeks ago  . Hyperlipidemia    hx  . Seasonal allergies   . Ulcerative colitis [facility])     Past Surgical History:  Procedure Laterality Date  . BUNIONECTOMY     bilateral and toe nails of big toes removed  . CESAREAN SECTION  1997/2006  . CHOLECYSTECTOMY N/A 12/01/2012   Procedure: LAPAROSCOPIC CHOLECYSTECTOMY;  Surgeon: Stark Klein, MD;  Location: [facility] ORS;  Service: General;  Laterality: N/A;  . COLONOSCOPY W/ POLYPECTOMY    . EYE SURGERY Bilateral 2013  . TONSILLECTOMY      Family History  Problem Relation Age of Onset  . Hypertension Mother   . Diabetes Mother   . Cancer Father        lung ca  .  Seizures Daughter        81yr   Social History:  reports that she has never smoked. She has never used smokeless tobacco. She reports that she drinks alcohol. She reports that she does not use drugs.  Allergies:  Allergies  Allergen Reactions  . Azithromycin Itching    Muscle aches  . Erythromycin Nausea And Vomiting  . Penicillins Nausea And Vomiting    Has patient had a PCN reaction causing immediate rash, facial/tongue/throat swelling, SOB or lightheadedness with hypotension: Yes Has patient had a PCN reaction causing severe rash involving mucus membranes or skin necrosis: No Has patient had a PCN reaction that required hospitalization No Has patient had a PCN reaction occurring within the last 10 years: No If all of the above answers are "NO", then may proceed  with Cephalosporin use.   . Tetracycline Nausea And Vomiting    Medications:                                                                                                                        I reviewed home medications   ROS:                                                                                                                                     14 systems reviewed and negative except above    Examination:                                                                                                      General: Appears well-developed and well-nourished.  Psych: Affect appropriate to situation Eyes: No scleral injection HENT: No OP obstrucion Head: Normocephalic.  Cardiovascular: Normal rate and regular rhythm.  Respiratory: Effort normal and breath sounds normal to anterior ascultation GI: Soft.  No distension. There is no tenderness.  Skin: WDI    Neurological Examination Mental Status: Alert, oriented, thought content appropriate.  Dysarthric.Speech fluent without evidence of aphasia. Able to follow 3 step commands without difficulty. Cranial Nerves: II: Visual fields grossly normal,   III,IV, VI: ptosis not present, extra-ocular motions intact bilaterally, pupils equal, round, reactive to light and accommodation. Left eye appears to have some esotropia. No abnormal saccades.  V,VII: ,left facial droop VIII: hearing normal bilaterally IX,X: uvula rises symmetrically XI: bilateral shoulder shrug XII: midline tongue extension Motor: Right : Upper extremity   5/5    Left:     Upper extremity   3-/5  Lower extremity   5/5     Lower extremity   3/5 Tone and bulk:normal tone throughout; no atrophy noted Sensory: Pinprick and light touch intact throughout, bilaterally Deep Tendon Reflexes: 2+ and symmetric throughout Plantars: Right: downgoing   Left: downgoing Cerebellar: normal finger-to-nose on right side, unable to test left side due to weakness.  Gait: normal gait and station     Lab Results: Basic Metabolic Panel: Recent Labs  Lab 02/18/18 0002  NA 136  K 3.2*  CL 102  CO2 26  GLUCOSE 226*  BUN 14  CREATININE 0.76  CALCIUM 8.7*    CBC: Recent Labs  Lab 02/18/18 0002  WBC 5.2  NEUTROABS 2.4  HGB 12.0  HCT 35.6  MCV 85.6  PLT 209    Coagulation Studies: Recent Labs    02/18/18 0002  LABPROT 11.9  INR 0.88    Imaging: Ct Angio Head W Or Wo Contrast  Result Date: 02/18/2018 CLINICAL DATA:  Slurred speech and left-sided weakness EXAM: CT ANGIOGRAPHY HEAD TECHNIQUE: Multidetector CT imaging of the head was performed using the standard protocol during bolus administration of intravenous contrast. Multiplanar CT image reconstructions and MIPs were obtained to evaluate the vascular anatomy. CONTRAST:  70m ISOVUE-370 IOPAMIDOL (ISOVUE-370) INJECTION 76% COMPARISON:  Head CT 02/18/2018 FINDINGS: CTA NECK FINDINGS AORTIC ARCH: There is no calcific atherosclerosis of the aortic arch. There is no aneurysm, dissection or hemodynamically significant stenosis of the visualized ascending aorta and aortic arch. Conventional 3 vessel aortic branching pattern.  The visualized proximal subclavian arteries are widely patent. RIGHT CAROTID SYSTEM: --Common carotid artery: Widely patent origin without common carotid artery dissection or aneurysm. --Internal carotid artery: No dissection, occlusion or aneurysm. No hemodynamically significant stenosis. --External carotid artery: No acute abnormality. LEFT CAROTID SYSTEM: --Common carotid artery: Widely patent origin without common carotid artery dissection or aneurysm. --Internal carotid artery:No dissection, occlusion or aneurysm. No hemodynamically significant stenosis. --External carotid artery: No acute abnormality. VERTEBRAL ARTERIES: Codominant configuration. Both origins are normal. No dissection, occlusion or flow-limiting stenosis to the vertebrobasilar confluence. SKELETON: There is no bony spinal canal stenosis. No lytic or blastic lesion. OTHER NECK: Normal pharynx, larynx and major salivary glands. No cervical lymphadenopathy. Unremarkable thyroid gland. UPPER CHEST: No pneumothorax or pleural effusion. No nodules or masses. CTA HEAD FINDINGS ANTERIOR CIRCULATION: --Intracranial internal carotid arteries: Normal. --Anterior cerebral arteries: Normal. Both A1 segments are present. Patent anterior communicating artery. --Middle cerebral arteries: Normal. --Posterior communicating arteries: Present bilaterally. POSTERIOR CIRCULATION: --Basilar artery: Normal. --Posterior cerebral arteries: Normal. --Superior cerebellar arteries: Normal. --Inferior cerebellar arteries: Normal anterior and posterior inferior cerebellar arteries. VENOUS SINUSES: As permitted by contrast timing, patent. ANATOMIC VARIANTS: None DELAYED PHASE: No parenchymal contrast enhancement. Review of the MIP images confirms the above findings. IMPRESSION: No emergent large vessel occlusion. Normal CTA of the head and neck Electronically Signed   By: KUlyses JarredM.D.   On: 02/18/2018 03:21   Ct Angio Neck W Or Wo Contrast  Result Date:  02/18/2018 CLINICAL DATA:  Slurred speech and left-sided weakness EXAM: CT ANGIOGRAPHY HEAD TECHNIQUE: Multidetector CT imaging of the head was performed using the standard protocol during bolus administration of intravenous contrast. Multiplanar CT image reconstructions and MIPs were obtained to evaluate the vascular anatomy. CONTRAST:  775mISOVUE-370 IOPAMIDOL (ISOVUE-370) INJECTION 76% COMPARISON:  Head CT 02/18/2018 FINDINGS: CTA NECK FINDINGS AORTIC ARCH: There is no calcific atherosclerosis of the aortic arch. There is no aneurysm, dissection or hemodynamically significant stenosis of the visualized ascending aorta and aortic arch. Conventional 3 vessel aortic branching pattern. The visualized proximal subclavian arteries are widely patent. RIGHT CAROTID SYSTEM: --Common carotid artery: Widely patent origin without common carotid artery dissection or aneurysm. --Internal carotid artery: No dissection, occlusion or aneurysm. No hemodynamically significant stenosis. --External carotid artery: No acute abnormality. LEFT CAROTID SYSTEM: --Common carotid artery: Widely patent  origin without common carotid artery dissection or aneurysm. --Internal carotid artery:No dissection, occlusion or aneurysm. No hemodynamically significant stenosis. --External carotid artery: No acute abnormality. VERTEBRAL ARTERIES: Codominant configuration. Both origins are normal. No dissection, occlusion or flow-limiting stenosis to the vertebrobasilar confluence. SKELETON: There is no bony spinal canal stenosis. No lytic or blastic lesion. OTHER NECK: Normal pharynx, larynx and major salivary glands. No cervical lymphadenopathy. Unremarkable thyroid gland. UPPER CHEST: No pneumothorax or pleural effusion. No nodules or masses. CTA HEAD FINDINGS ANTERIOR CIRCULATION: --Intracranial internal carotid arteries: Normal. --Anterior cerebral arteries: Normal. Both A1 segments are present. Patent anterior communicating artery. --Middle cerebral  arteries: Normal. --Posterior communicating arteries: Present bilaterally. POSTERIOR CIRCULATION: --Basilar artery: Normal. --Posterior cerebral arteries: Normal. --Superior cerebellar arteries: Normal. --Inferior cerebellar arteries: Normal anterior and posterior inferior cerebellar arteries. VENOUS SINUSES: As permitted by contrast timing, patent. ANATOMIC VARIANTS: None DELAYED PHASE: No parenchymal contrast enhancement. Review of the MIP images confirms the above findings. IMPRESSION: No emergent large vessel occlusion. Normal CTA of the head and neck Electronically Signed   By: Ulyses Jarred M.D.   On: 02/18/2018 03:21   Ct Head Code Stroke Wo Contrast  Result Date: 02/18/2018 CLINICAL DATA:  Code stroke. Sudden onset left-sided weakness and slurred speech. EXAM: CT HEAD WITHOUT CONTRAST TECHNIQUE: Contiguous axial images were obtained from the base of the skull through the vertex without intravenous contrast. COMPARISON:  None. FINDINGS: Brain: There is no mass, hemorrhage or extra-axial collection. The size and configuration of the ventricles and extra-axial CSF spaces are normal. There is no acute or chronic infarction. The brain parenchyma is normal. Vascular: No abnormal hyperdensity of the major intracranial arteries or dural venous sinuses. No intracranial atherosclerosis. Skull: The visualized skull base, calvarium and extracranial soft tissues are normal. Sinuses/Orbits: No fluid levels or advanced mucosal thickening of the visualized paranasal sinuses. No mastoid or middle ear effusion. The orbits are normal. ASPECTS [facility] Stroke Program Early CT Score) - Ganglionic level infarction (caudate, lentiform nuclei, internal capsule, insula, M1-M3 cortex): 7 - Supraganglionic infarction (M4-M6 cortex): 3 Total score (0-10 with 10 being normal): 10 IMPRESSION: 1. No hemorrhage or mass effect. 2. ASPECTS is 10. These results were relayed by telephone at the time of interpretation on 02/18/2018 at 12:15 am  to Dr. Marjean Donna via the patient's nurse. Electronically Signed   By: Ulyses Jarred M.D.   On: 02/18/2018 00:18     ASSESSMENT AND PLAN  62 y.o. female with past medical history of diabetes mellitus, hyperlipidemia, ulcerative colitis, asthma presents to the emergency department at [facility] as a code stroke for sudden onset slurred speech and left-sided weakness.  She received IV TPA and outside hospital.  CT angiogram head and neck normal, no large vessel occlusion.  Right hemispheric acute ischemic stroke with left hemiparesis status post IV TPA  # MRI of the brain without contrast #Transthoracic Echo  # No AP /AC for 24 hrs # 24 hr post tPA scan ordered  #Start or continue Atorvastatin 80 mg/other high intensity statin after swallow eval # BP goal: permissive HTN upto 180/105  # HBAIC and Lipid profile # Telemetry monitoring # Frequent neuro checks #  stroke swallow screen # PT/OT after 24hrs   Diabetes Mellitus - non insulin dependent - hold oral hypoglycemics while  In hospital  Asthma - continue Albuterol    DVT PPX; SCD Diet: swallow eval pending, suspect she will pass  Please page stroke NP  Or  PA  Or MD  from 8am -4 pm  as this patient from this time will be  followed by the stroke.   You can look them up on [email or web address]  Password [email or web address]    This patient is neurologically critically ill due to ischemic stroke status post TPA. She is at risk for significant risk of neurological worsening from cerebral edema,  death from brain herniation, heart failure, hemorrhagic conversion, infection, respiratory failure and seizure. This patient's care requires constant monitoring of vital signs, hemodynamics, respiratory and cardiac monitoring, review of multiple databases, neurological assessment, discussion with family, other specialists and medical decision making of high complexity.  I spent 50 minutes of neurocritical time in the care of this  patient.       Paytience Bures [facility] Pager Number [phone]

## 2018-02-18 NOTE — Progress Notes (Signed)
STROKE TEAM PROGRESS NOTE   SUBJECTIVE (INTERVAL HISTORY) Her husband is at the bedside.  Overall she feels her condition is stable. She still has left facial droop, left sided hemiparesis, no cortical sign, consistent with small vessel disease. She stated that she went to bathroom at 11pm and felt dizziness, almost fell, but caught herself, then she found she had left drooling and slurry speech with left side weakness, arm > leg. Had tPA around midnight. Transfer from [facility] to [facility].    OBJECTIVE Temp:  [97.6 F (36.4 C)-98.5 F (36.9 C)] 98.5 F (36.9 C) (08/08 0734) Pulse Rate:  [65-85] 79 (08/08 0930) Resp:  [15-37] 22 (08/08 0930) BP: (125-184)/(56-108) 152/100 (08/08 0930) SpO2:  [91 %-100 %] 99 % (08/08 0930) Weight:  [65.9 kg-67.7 kg] 67.7 kg (08/08 0545)  Recent Labs  Lab 02/18/18 0009  GLUCAP 221*   Recent Labs  Lab 02/18/18 0002  NA 136  K 3.2*  CL 102  CO2 26  GLUCOSE 226*  BUN 14  CREATININE 0.76  CALCIUM 8.7*   Recent Labs  Lab 02/18/18 0002  AST 44*  ALT 30  ALKPHOS 105  BILITOT 0.6  PROT 7.7  ALBUMIN 3.9   Recent Labs  Lab 02/18/18 0002  WBC 5.2  NEUTROABS 2.4  HGB 12.0  HCT 35.6  MCV 85.6  PLT 209   Recent Labs  Lab 02/18/18 0002  TROPONINI <0.03   Recent Labs    02/18/18 0002  LABPROT 11.9  INR 0.88   Recent Labs    02/18/18 0003  COLORURINE COLORLESS*  LABSPEC 1.006  PHURINE 7.0  GLUCOSEU 150*  HGBUR SMALL*  BILIRUBINUR NEGATIVE  KETONESUR NEGATIVE  PROTEINUR NEGATIVE  NITRITE NEGATIVE  LEUKOCYTESUR NEGATIVE       Component Value Date/Time   CHOL 223 (H) 11/07/2014 1651   CHOL 211 (H) 06/22/2014 1432   TRIG 385.0 (H) 11/07/2014 1651   TRIG 473 (H) 06/22/2014 1432   HDL 43.60 11/07/2014 1651   HDL 42 06/22/2014 1432   CHOLHDL 5 11/07/2014 1651   VLDL 77.0 (H) 11/07/2014 1651   VLDL SEE COMMENT 06/22/2014 1432   LDLCALC SEE COMMENT 06/22/2014 1432   Lab Results  Component Value Date   HGBA1C 9.3 (H) 04/28/2017       Component Value Date/Time   LABOPIA NONE DETECTED 02/18/2018 0003   COCAINSCRNUR NONE DETECTED 02/18/2018 0003   LABBENZ TEST NOT PERFORMED, REAGENT NOT AVAILABLE (A) 02/18/2018 0003   AMPHETMU NONE DETECTED 02/18/2018 0003   THCU NONE DETECTED 02/18/2018 0003   LABBARB NONE DETECTED 02/18/2018 0003    Recent Labs  Lab 02/18/18 0002  ETH <10    I have personally reviewed the radiological images below and agree with the radiology interpretations.  Ct Angio Head W Or Wo Contrast  Result Date: 02/18/2018 CLINICAL DATA:  Slurred speech and left-sided weakness EXAM: CT ANGIOGRAPHY HEAD TECHNIQUE: Multidetector CT imaging of the head was performed using the standard protocol during bolus administration of intravenous contrast. Multiplanar CT image reconstructions and MIPs were obtained to evaluate the vascular anatomy. CONTRAST:  54m ISOVUE-370 IOPAMIDOL (ISOVUE-370) INJECTION 76% COMPARISON:  Head CT 02/18/2018 FINDINGS: CTA NECK FINDINGS AORTIC ARCH: There is no calcific atherosclerosis of the aortic arch. There is no aneurysm, dissection or hemodynamically significant stenosis of the visualized ascending aorta and aortic arch. Conventional 3 vessel aortic branching pattern. The visualized proximal subclavian arteries are widely patent. RIGHT CAROTID SYSTEM: --Common carotid artery: Widely patent origin without common carotid artery  dissection or aneurysm. --Internal carotid artery: No dissection, occlusion or aneurysm. No hemodynamically significant stenosis. --External carotid artery: No acute abnormality. LEFT CAROTID SYSTEM: --Common carotid artery: Widely patent origin without common carotid artery dissection or aneurysm. --Internal carotid artery:No dissection, occlusion or aneurysm. No hemodynamically significant stenosis. --External carotid artery: No acute abnormality. VERTEBRAL ARTERIES: Codominant configuration. Both origins are normal. No dissection, occlusion or flow-limiting  stenosis to the vertebrobasilar confluence. SKELETON: There is no bony spinal canal stenosis. No lytic or blastic lesion. OTHER NECK: Normal pharynx, larynx and major salivary glands. No cervical lymphadenopathy. Unremarkable thyroid gland. UPPER CHEST: No pneumothorax or pleural effusion. No nodules or masses. CTA HEAD FINDINGS ANTERIOR CIRCULATION: --Intracranial internal carotid arteries: Normal. --Anterior cerebral arteries: Normal. Both A1 segments are present. Patent anterior communicating artery. --Middle cerebral arteries: Normal. --Posterior communicating arteries: Present bilaterally. POSTERIOR CIRCULATION: --Basilar artery: Normal. --Posterior cerebral arteries: Normal. --Superior cerebellar arteries: Normal. --Inferior cerebellar arteries: Normal anterior and posterior inferior cerebellar arteries. VENOUS SINUSES: As permitted by contrast timing, patent. ANATOMIC VARIANTS: None DELAYED PHASE: No parenchymal contrast enhancement. Review of the MIP images confirms the above findings. IMPRESSION: No emergent large vessel occlusion. Normal CTA of the head and neck Electronically Signed   By: Ulyses Jarred M.D.   On: 02/18/2018 03:21   Ct Angio Neck W Or Wo Contrast  Result Date: 02/18/2018 CLINICAL DATA:  Slurred speech and left-sided weakness EXAM: CT ANGIOGRAPHY HEAD TECHNIQUE: Multidetector CT imaging of the head was performed using the standard protocol during bolus administration of intravenous contrast. Multiplanar CT image reconstructions and MIPs were obtained to evaluate the vascular anatomy. CONTRAST:  39m ISOVUE-370 IOPAMIDOL (ISOVUE-370) INJECTION 76% COMPARISON:  Head CT 02/18/2018 FINDINGS: CTA NECK FINDINGS AORTIC ARCH: There is no calcific atherosclerosis of the aortic arch. There is no aneurysm, dissection or hemodynamically significant stenosis of the visualized ascending aorta and aortic arch. Conventional 3 vessel aortic branching pattern. The visualized proximal subclavian arteries  are widely patent. RIGHT CAROTID SYSTEM: --Common carotid artery: Widely patent origin without common carotid artery dissection or aneurysm. --Internal carotid artery: No dissection, occlusion or aneurysm. No hemodynamically significant stenosis. --External carotid artery: No acute abnormality. LEFT CAROTID SYSTEM: --Common carotid artery: Widely patent origin without common carotid artery dissection or aneurysm. --Internal carotid artery:No dissection, occlusion or aneurysm. No hemodynamically significant stenosis. --External carotid artery: No acute abnormality. VERTEBRAL ARTERIES: Codominant configuration. Both origins are normal. No dissection, occlusion or flow-limiting stenosis to the vertebrobasilar confluence. SKELETON: There is no bony spinal canal stenosis. No lytic or blastic lesion. OTHER NECK: Normal pharynx, larynx and major salivary glands. No cervical lymphadenopathy. Unremarkable thyroid gland. UPPER CHEST: No pneumothorax or pleural effusion. No nodules or masses. CTA HEAD FINDINGS ANTERIOR CIRCULATION: --Intracranial internal carotid arteries: Normal. --Anterior cerebral arteries: Normal. Both A1 segments are present. Patent anterior communicating artery. --Middle cerebral arteries: Normal. --Posterior communicating arteries: Present bilaterally. POSTERIOR CIRCULATION: --Basilar artery: Normal. --Posterior cerebral arteries: Normal. --Superior cerebellar arteries: Normal. --Inferior cerebellar arteries: Normal anterior and posterior inferior cerebellar arteries. VENOUS SINUSES: As permitted by contrast timing, patent. ANATOMIC VARIANTS: None DELAYED PHASE: No parenchymal contrast enhancement. Review of the MIP images confirms the above findings. IMPRESSION: No emergent large vessel occlusion. Normal CTA of the head and neck Electronically Signed   By: KUlyses JarredM.D.   On: 02/18/2018 03:21   Ct Head Code Stroke Wo Contrast  Result Date: 02/18/2018 CLINICAL DATA:  Code stroke. Sudden onset  left-sided weakness and slurred speech. EXAM: CT HEAD WITHOUT CONTRAST TECHNIQUE:  Contiguous axial images were obtained from the base of the skull through the vertex without intravenous contrast. COMPARISON:  None. FINDINGS: Brain: There is no mass, hemorrhage or extra-axial collection. The size and configuration of the ventricles and extra-axial CSF spaces are normal. There is no acute or chronic infarction. The brain parenchyma is normal. Vascular: No abnormal hyperdensity of the major intracranial arteries or dural venous sinuses. No intracranial atherosclerosis. Skull: The visualized skull base, calvarium and extracranial soft tissues are normal. Sinuses/Orbits: No fluid levels or advanced mucosal thickening of the visualized paranasal sinuses. No mastoid or middle ear effusion. The orbits are normal. ASPECTS [facility] Stroke Program Early CT Score) - Ganglionic level infarction (caudate, lentiform nuclei, internal capsule, insula, M1-M3 cortex): 7 - Supraganglionic infarction (M4-M6 cortex): 3 Total score (0-10 with 10 being normal): 10 IMPRESSION: 1. No hemorrhage or mass effect. 2. ASPECTS is 10. These results were relayed by telephone at the time of interpretation on 02/18/2018 at 12:15 am to Dr. Marjean Donna via the patient's nurse. Electronically Signed   By: Ulyses Jarred M.D.   On: 02/18/2018 00:18   MRI pending  TTE pending   PHYSICAL EXAM  Temp:  [97.6 F (36.4 C)-98.5 F (36.9 C)] 98.5 F (36.9 C) (08/08 0734) Pulse Rate:  [65-85] 79 (08/08 0930) Resp:  [15-37] 22 (08/08 0930) BP: (125-184)/(56-108) 152/100 (08/08 0930) SpO2:  [91 %-100 %] 99 % (08/08 0930) Weight:  [65.9 kg-67.7 kg] 67.7 kg (08/08 0545)  General - Well nourished, well developed, in no apparent distress.  Ophthalmologic - fundi not visualized due to noncooperation.  Cardiovascular - Regular rate and rhythm.  Mental Status -  Level of arousal and orientation to time, place, and person were intact. Language  including expression, naming, repetition, comprehension was assessed and found intact. Fund of Knowledge was assessed and was intact.  Cranial Nerves II - XII - II - Visual field intact OU. III, IV, VI - Extraocular movements intact. V - Facial sensation intact bilaterally. VII - left facial droop. VIII - Hearing & vestibular intact bilaterally. X - Palate elevates symmetrically, dysarthria. XI - Chin turning & shoulder shrug intact bilaterally. XII - Tongue protrusion intact.  Motor Strength - The patient's strength was normal in RUE and RLE, however, LUE 3-/5 and LLE 4-/5 proximally and 4+/5 distally and pronator drift was absent.  Bulk was normal and fasciculations were absent.   Motor Tone - Muscle tone was assessed at the neck and appendages and was normal.  Reflexes - The patient's reflexes were symmetrical in all extremities and she had no pathological reflexes.  Sensory - Light touch, temperature/pinprick were assessed and were symmetrical.    Coordination - The patient had normal movements in the right hand with no ataxia or dysmetria.  Tremor was absent.  Gait and Station - deferred.   ASSESSMENT/PLAN [name] is a 62 y.o. female with history of DM, HLD, ulcerative colitis admitted for left facial droop and left hemiparesis. tPA given.   Stroke:  right subcortical infarct likely secondary to small vessel disease source  Resultant left facial droop and left hemiparesis  MRI  pending  CTA head and neck unremarkable  2D Echo  pending  LDL pending  HgbA1c pending  UDS negative  SCDs for VTE prophylaxis  Diet NPO for now  No antithrombotic prior to admission, now on No antithrombotic within 24h of tPA  Patient counseled to be compliant with her antithrombotic medications  Ongoing aggressive stroke risk factor management  Therapy recommendations:  pending  Disposition:  pending  Diabetes  HgbA1c pending goal <  7.0  Uncontrolled  hyperglycemia  CBG monitoring  SSI  DM education and close PCP follow up  Hypertension Stable Permissive hypertension (OK if <180/105) for 24-48 hours post stroke and then gradually normalized within 5-7 days.  Long term BP goal normotensive  Hyperlipidemia  Home meds:  none   LDL pending, goal < 70  Consider statin once po access  Continue statin at discharge  Other Stroke Risk Factors    Other Active Problems  Ulcerative colitis  Hypokalemia - K 3.2 - supplement  Hospital day # 0  This patient is critically ill due to stroke s/p tPA and at significant risk of neurological worsening, death form recurrent stroke, hemorrhagic conversion, brain herination, DKA. This patient's care requires constant monitoring of vital signs, hemodynamics, respiratory and cardiac monitoring, review of multiple databases, neurological assessment, discussion with family, other specialists and medical decision making of high complexity. I spent 30 minutes of neurocritical care time in the care of this patient.   Rosalin Hawking, MD PhD Stroke Neurology 02/18/2018 9:38 AM    To contact Stroke Continuity provider, please refer to [email or web address] After hours, contact General Neurology

## 2018-02-18 NOTE — ED Triage Notes (Addendum)
Pt arrived via POV d/t left sided weakness that began around 11pm. Pt is A&O x4 at this time with slurred speech.

## 2018-02-18 NOTE — Progress Notes (Signed)
  Echocardiogram 2D Echocardiogram has been performed.  Madelaine Etienne 02/18/2018, 10:37 AM

## 2018-02-18 NOTE — Progress Notes (Signed)
Inpatient Diabetes Program Recommendations  AACE/ADA: New Consensus Statement on Inpatient Glycemic Control (2015)  Target Ranges:  Prepandial:   less than 140 mg/dL      Peak postprandial:   less than 180 mg/dL (1-2 hours)      Critically ill patients:  140 - 180 mg/dL   Results for ALETHIA, MELENDREZ (MRN [id number]) as of 02/18/2018 09:46  Ref. Range 02/18/2018 00:09  Glucose-Capillary Latest Ref Range: 70 - 99 mg/dL 221 (H)   Review of Glycemic Control  Diabetes history: DM 2 Outpatient Diabetes medications: Glipizide 5 mg BID Current orders for Inpatient glycemic control: None  Inpatient Diabetes Program Recommendations:    Glucose in the 200's. History of DM 2. Please consider CBGs and Novolog 0-9 units tid and Novolog 0-5 units qhs while here.  Noted A1c ordered.  Thanks,  Tama Headings RN, MSN, BC-ADM Inpatient Diabetes Coordinator Team Pager [phone] (8a-5p)

## 2018-02-18 NOTE — Progress Notes (Signed)
  Rehab Admissions Coordinator Note:  Per OT recommendation, Patient was screened by Jhonnie Garner for appropriateness for an Inpatient Acute Rehab Consult.  At this time, we are recommending Inpatient Rehab consult and await PT evaluation. AC will contact MD for IP rehab consult order. Please call if questions.   Jhonnie Garner 02/18/2018, 5:40 PM  I can be reached at [phone].

## 2018-02-18 NOTE — Consult Note (Signed)
[facility] TeleNeurology Consult Services    Date of Service:   02/18/2018 00:12:03  Impression:       .  RO Acute Ischemic Stroke     .  Small Vessel Infarct most likely but needs w/u   Mechanism of Stroke: Small Vessel Disease  Comments: Most remarkable symtpoms is her dysarthria but there are minor motor impairments as well. Suspect small vessel infarct, could be posterior circ given her dizziness. Stroke w/u recommended.  Metrics: Last Known Well: 02/17/2018 23:00:00 Start Time: 02/18/2018 00:11:17 Arrival Time: 02/18/2018 23:47:00 Stamp Time: 02/18/2018 00:12:03 Time First Login Attempt: 02/18/2018 00:17:51 Video Start Time: 02/18/2018 00:17:51  Symptoms: dysarthria, left sided weakness NIHSS Start Assessment Time: 02/18/2018 00:17:23 tPA Verbal Order Time: 02/18/2018 00:25:28 Patient is a candidate for tPA. tPA CPOE Order Time: 02/18/2018 00:29:42 Needle Time: 02/18/2018 00:41:29 Weight Noted by Staff: 65.9 kg  CT head showed no acute hemorrhage or acute core infarct.  Advanced imaging was not obtained as the presentation was not suggestive of Large Vessel Occlusive Disease.  ER physician notified of the decision on thrombolytics management.  Recommend: IV tPA Routine post tPA monitoring including neuro checks and blood pressure control during/after treatment  Monitor blood pressure  Check blood pressure and NIHSS every 15 min for 2 h, then every 30 min for 6 h, and finally every hour for 16 h    Systolic greater than 694 OR diastolic greater than 854:  Option 1: Labetalol 10 mg IV for 1 - 2 min  May repeat or double labetalol every 10 min to maximum dose of 300 mg, or give initial labetalol dose, then start labetalol drip at 2 - 8 mg/min.  Option 2: Nicardipine 5 mg/h IV infusion as initial dose and titrate to desired effect by increasing 2.5 mg/h every 5 min to maximum of 15 mg/h;   If blood pressure is not controlled by labetolol or nicardipine,  consider sodium nitroprusside.    Admission to ICU  CT brain 24 hours post tPA  NPO until swallowing screen performed and passed  No antiplatelet agents or anticoagulants (including heparin for DVT prophylaxis) in first 24 hours  No Foley catheter, nasogastric tube, arterial catheter or central venous catheter for 24 hr, unless absolutely necessary  Telemetry   Inpatient Neurology Consultation  Stroke evaluation as per inpatient neurology recommendations  Discussed with ED MD      ------------------------------------------------------------------------------  History of Present Illness:  Patient is a 62 years old Female who presents with symptoms of dysarthria, left sided weakness   62 yo F w h/o DM who presents with dizziness and acute left sided weakness as she was going to bed this evening. The patient quickly noted that she could not move her left arm as well as her right and that her speech was slurred. She called her husband who took her to the ED. Symptoms persist here with most remarkable symptoms being dysarthria.  She has no h/o anticoag use, no recent stroke or trauma, no bleeding and no prior ICH  Stroke alert called for Triage presentations  Examination:  1A: Level of Consciousness - Alert; keenly responsive + 0 1B: Ask Month and Age - Both Questions Right + 0 1C: Blink Eyes & Squeeze Hands - Performs Both Tasks + 0 2: Test Horizontal Extraocular Movements - Normal + 0 3: Test Visual Fields - No Visual Loss + 0 4: Test Facial Palsy (Use Grimace if Obtunded) - Minor paralysis (flat nasolabial fold, smile asymetry) + 1  5A: Test Left Arm Motor Drift - Drift, but doesn't hit bed + 1 5B: Test Right Arm Motor Drift - No Drift for 10 Seconds + 0 6A: Test Left Leg Motor Drift - No Drift for 5 Seconds + 0 6B: Test Right Leg Motor Drift - No Drift for 5 Seconds + 0 7: Test Limb Ataxia (FNF/Heel-Shin) - No Ataxia + 0 8: Test Sensation - Mild-Moderate Loss: Less Sharp/More Dull  + 1 9: Test Language/Aphasia - Normal; No aphasia + 0 10: Test Dysarthria - Severe Dysarthria: Unintelligble Slurring or Out of Proportion to Dysphasia + 2 11: Test Extinction/Inattention - No abnormality + 0  NIHSS Score: 5  Patient was informed the Neurology Consult would happen via TeleHealth consult by way of interactive audio and video telecommunications and consented to receiving care in this manner.  Due to the immediate potential for life-threatening deterioration due to underlying acute neurologic illness, I spent 35 minutes providing critical care. This time includes time for face to face visit via telemedicine, review of medical records, imaging studies and discussion of findings with providers, the patient and/or family.  Verbal Consent to tPA:   I have explained to the patient/family/guardian the nature of the patient's condition, the use of tPA fibrinolytic agent, and the benefits to be reasonably expected compared with alternative approaches. I have discussed the likelihood of major risks or complications of this procedure including (if applicable) but not limited to loss of limb function, brain damage, paralysis, hemorrhage, infection, complications from transfusion of blood components, drug reactions, blood clots and loss of life. I have also indicated that with any procedure there is always the possibility of an unexpected complication. I have explained the risks which include:?     1. Death, Stroke or permanent neurologic injury (paralysis, coma, etc)?   2. Worsening of stroke symptoms from swelling or bleeding in the brain?   3. Bleeding in other parts of the body?   4. Need for blood transfusions to replace blood or clotting factors?   5. Allergic reaction to medications?   6. Other unexpected complications?     All questions were answered and the patient/family/guardian express understanding of the treatment plan and consent to the procedure.   Dr Yetta Barre   [facility] [phone]

## 2018-02-18 NOTE — Evaluation (Signed)
Occupational Therapy Evaluation Patient Details Name: [name] MRN: [id number] DOB: 07-Nov-1955 Today's Date: 02/18/2018    History of Present Illness [name] is an 62 y.o. female with past medical history of diabetes mellitus, hyperlipidemia, ulcerative colitis, asthma presents to the emergency department at [facility] as a code stroke for sudden onset slurred speech and left-sided weakness.   Clinical Impression   This 62 yo female admitted with above presents to acute OT with decreased balance, decreased mobility, increased tone LUE, pain LUE, decreased coordination/strength LUE/LLE all affecting pt's PLOF of being totally independent with all of her basic ADLs and IADLs. She will benefit from acute OT with follow up OT on CIR to go home with family.     Follow Up Recommendations  CIR;Supervision/Assistance - 24 hour    Equipment Recommendations  Other (comment)(TBD next venue)    Recommendations for Other Services Rehab consult     Precautions / Restrictions Precautions Precautions: Fall Restrictions Weight Bearing Restrictions: No      Mobility Bed Mobility Overal bed mobility: Needs Assistance Bed Mobility: Supine to Sit     Supine to sit: Mod assist     General bed mobility comments: VCs for sequence and technique coming up on left side  Transfers Overall transfer level: Needs assistance Equipment used: 2 person hand held assist Transfers: Sit to/from Stand;Stand Pivot Transfers Sit to Stand: Min assist;+2 physical assistance Stand pivot transfers: Mod assist;+2 physical assistance            Balance Overall balance assessment: Needs assistance Sitting-balance support: No upper extremity supported;Feet supported Sitting balance-Leahy Scale: Fair     Standing balance support: Bilateral upper extremity supported Standing balance-Leahy Scale: Poor Standing balance comment: pt with left lateral lean that increased the more  activity she did                           ADL either performed or assessed with clinical judgement   ADL Overall ADL's : Needs assistance/impaired Eating/Feeding: Supervision/ safety;Set up Eating/Feeding Details (indicate cue type and reason): supported sitting Grooming: Moderate assistance Grooming Details (indicate cue type and reason): supported sitting Upper Body Bathing: Minimal assistance Upper Body Bathing Details (indicate cue type and reason): supported sitting Lower Body Bathing: Maximal assistance Lower Body Bathing Details (indicate cue type and reason): min A +2 sit<>stand Upper Body Dressing : Moderate assistance Upper Body Dressing Details (indicate cue type and reason): supported sitting Lower Body Dressing: Maximal assistance Lower Body Dressing Details (indicate cue type and reason): min A sit<>stand Toilet Transfer: Moderate assistance;+2 for safety/equipment;Ambulation;Comfort height toilet;Grab bars Toilet Transfer Details (indicate cue type and reason): Bil HHA Toileting- Clothing Manipulation and Hygiene: Moderate assistance Toileting - Clothing Manipulation Details (indicate cue type and reason): min A +2 sit<>stand             Vision Baseline Vision/History: (wears contacts) Wears Glasses: At all times Additional Comments: to be further tested            Pertinent Vitals/Pain Pain Assessment: Faces Faces Pain Scale: Hurts little more Pain Location: left arm at rest (appears due to tone) Pain Descriptors / Indicators: Aching;Sore Pain Intervention(s): Limited activity within patient's tolerance;Monitored during session;Repositioned     Hand Dominance Right   Extremity/Trunk Assessment Upper Extremity Assessment Upper Extremity Assessment: LUE deficits/detail LUE Deficits / Details: some isolated movement shoulder all the way to hand with diminishing movement as get more distal, increased tone intermittently LUE  Coordination:  decreased fine motor;decreased gross motor           Communication Communication Communication: No difficulties   Cognition Arousal/Alertness: Awake/alert Behavior During Therapy: WFL for tasks assessed/performed Overall Cognitive Status: Within Functional Limits for tasks assessed                                                Home Living Family/patient expects to be discharged to:: Private residence Living Arrangements: Spouse/significant other Available Help at Discharge: Family;Available 24 hours/day(daughter days/husband nights) Type of Home: House Home Access: Level entry     Home Layout: Two level Alternate Level Stairs-Number of Steps: 10 Alternate Level Stairs-Rails: Left(going up) Bathroom Shower/Tub: Walk-in shower;Door   [employer] Toilet: Standard     Home Equipment: None          Prior Functioning/Environment Level of Independence: Independent                 OT Problem List: Decreased strength;Decreased range of motion;Impaired balance (sitting and/or standing);Decreased coordination;Decreased safety awareness;Pain;Impaired UE functional use;Decreased knowledge of use of DME or AE;Impaired tone      OT Treatment/Interventions: Balance training;Therapeutic exercise;Neuromuscular education;Therapeutic activities;DME and/or AE instruction;Patient/family education    OT Goals(Current goals can be found in the care plan section) Acute Rehab OT Goals Patient Stated Goal: to rehab then home OT Goal Formulation: With patient/family Time For Goal Achievement: 03/04/18 Potential to Achieve Goals: Good  OT Frequency: Min 3X/week           Co-evaluation PT/OT/SLP Co-Evaluation/Treatment: Yes Reason for Co-Treatment: Complexity of the patient's impairments (multi-system involvement);For patient/therapist safety   OT goals addressed during session: ADL's and self-care;Strengthening/ROM      AM-PAC PT "6 Clicks" Daily Activity      Outcome Measure Help from another person eating meals?: A Little Help from another person taking care of personal grooming?: A Little Help from another person toileting, which includes using toliet, bedpan, or urinal?: A Lot Help from another person bathing (including washing, rinsing, drying)?: A Lot Help from another person to put on and taking off regular upper body clothing?: A Lot Help from another person to put on and taking off regular lower body clothing?: A Lot 6 Click Score: 14   End of Session Equipment Utilized During Treatment: Gait belt Nurse Communication: Mobility status  Activity Tolerance: Patient tolerated treatment well Patient left: in chair;with call bell/phone within reach;with chair alarm set  OT Visit Diagnosis: Unsteadiness on feet (R26.81);Other abnormalities of gait and mobility (R26.89);Muscle weakness (generalized) (M62.81);Pain;Hemiplegia and hemiparesis Hemiplegia - Right/Left: Left Hemiplegia - dominant/non-dominant: Non-Dominant Hemiplegia - caused by: Cerebral infarction Pain - Right/Left: Left Pain - part of body: Arm                Time: 9728-2060 OT Time Calculation (min): 29 min Charges:  OT General Charges $OT Visit: 1 Visit OT Evaluation $OT Eval Moderate Complexity: [place], [place] [phone] 02/18/2018

## 2018-02-18 NOTE — Progress Notes (Signed)
PT Cancellation Note  Patient Details Name: [name] MRN: [id number] DOB: 1956-06-16   Cancelled Treatment:    Reason Eval/Treat Not Completed: Active bedrest order.  24 hours is Midnight tonight, per Dr. Erlinda Hong, pt may be released to see this afternoon.  Will see if able. 02/18/2018  Donnella Sham, [place] [phone]  (pager)   [name] 02/18/2018, 10:12 AM

## 2018-02-18 NOTE — Evaluation (Signed)
Clinical/Bedside Swallow Evaluation Patient Details  Name: [name] MRN: [id number] Date of Birth: July 27, 1955  Today's Date: 02/18/2018 Time: SLP Start Time (ACUTE ONLY): 1139 SLP Stop Time (ACUTE ONLY): 1156 SLP Time Calculation (min) (ACUTE ONLY): 17 min  Past Medical History:  Past Medical History:  Diagnosis Date  . Asthma   . Diabetes mellitus without complication ([facility]) [id number]   hx. NIDDM-dx. 3 weeks ago  . Hyperlipidemia    hx  . Seasonal allergies   . Ulcerative colitis [facility])    Past Surgical History:  Past Surgical History:  Procedure Laterality Date  . BUNIONECTOMY     bilateral and toe nails of big toes removed  . CESAREAN SECTION  1997/2006  . CHOLECYSTECTOMY N/A 12/01/2012   Procedure: LAPAROSCOPIC CHOLECYSTECTOMY;  Surgeon: Stark Klein, MD;  Location: [facility] ORS;  Service: General;  Laterality: N/A;  . COLONOSCOPY W/ POLYPECTOMY    . EYE SURGERY Bilateral 2013  . TONSILLECTOMY     HPI:  [name] is a 62 y.o. female with history of DM, HLD, ulcerative colitis admitted for left facial droop and left hemiparesis. tPA given. right subcortical infarct    Assessment / Plan / Recommendation Clinical Impression  Pt demonstrates mild oral dysphagia with decreased strength of upper lip for strong seal. Pt is aware and compensates by placing her hand on her mouth. Successfully cleared buccal cavity. No signs of aspiration. Pt elected to continue regular diet textures. Encouraged oral care after meals. Will f/u x1 for tolerance and evaluation of speech and cognition.  SLP Visit Diagnosis: Dysphagia, oral phase (R13.11)    Aspiration Risk  Mild aspiration risk    Diet Recommendation Regular;Thin liquid   Liquid Administration via: Cup;Straw Medication Administration: Whole meds with liquid Supervision: Patient able to self feed Compensations: Slow rate;Small sips/bites Postural Changes: Seated upright at 90 degrees    Other  Recommendations Oral Care  Recommendations: Patient independent with oral care   Follow up Recommendations None      Frequency and Duration min 1 x/week  1 week       Prognosis Prognosis for Safe Diet Advancement: Good      Swallow Study   General HPI: [name] is a 62 y.o. female with history of DM, HLD, ulcerative colitis admitted for left facial droop and left hemiparesis. tPA given. right subcortical infarct  Type of Study: Bedside Swallow Evaluation Diet Prior to this Study: NPO Respiratory Status: Room air History of Recent Intubation: No Behavior/Cognition: Alert;Cooperative Oral Cavity Assessment: Within Functional Limits Oral Care Completed by SLP: No Oral Cavity - Dentition: Adequate natural dentition Vision: Functional for self-feeding Self-Feeding Abilities: Able to feed self Patient Positioning: Upright in bed Baseline Vocal Quality: Normal Volitional Cough: Strong Volitional Swallow: Able to elicit    Oral/Motor/Sensory Function Overall Oral Motor/Sensory Function: Mild impairment Facial ROM: Within Functional Limits Facial Symmetry: Abnormal symmetry left Facial Strength: Reduced left Facial Sensation: Reduced left Lingual ROM: Within Functional Limits Lingual Symmetry: Within Functional Limits Lingual Strength: Within Functional Limits Lingual Sensation: Within Functional Limits Velum: Within Functional Limits Mandible: Within Functional Limits   Ice Chips     Thin Liquid Thin Liquid: Within functional limits Presentation: Cup;Straw;Self Fed    Nectar Thick Nectar Thick Liquid: Not tested   Honey Thick Honey Thick Liquid: Not tested   Puree Puree: Impaired Presentation: Self Fed;Spoon Oral Phase Functional Implications: Left anterior spillage   Solid     Solid: Within functional limits Presentation: Self Fed  [name], Katherene Ponto 02/18/2018,1:43 PM

## 2018-02-18 NOTE — Progress Notes (Signed)
OT Cancellation Note  Patient Details Name: [name] MRN: [id number] DOB: [birth year]   Cancelled Treatment:    Reason Eval/Treat Not Completed: Other (comment). Pt had TPA at 12:42 AM, spoke with Dr. Erlinda Hong this AM in hallways when he was asking about 4N18 being seen and he said we could see pt 12 hours post TPA which would be 12:42 PM--will attempt eval post this time as appropriate.   Almon Register [phone] 02/18/2018, 10:59 AM

## 2018-02-19 DIAGNOSIS — I639 Cerebral infarction, unspecified: Secondary | ICD-10-CM

## 2018-02-19 DIAGNOSIS — E876 Hypokalemia: Secondary | ICD-10-CM

## 2018-02-19 DIAGNOSIS — E782 Mixed hyperlipidemia: Secondary | ICD-10-CM

## 2018-02-19 LAB — BASIC METABOLIC PANEL
Anion gap: 9 (ref 5–15)
BUN: 10 mg/dL (ref 8–23)
CALCIUM: 9.1 mg/dL (ref 8.9–10.3)
CO2: 23 mmol/L (ref 22–32)
CREATININE: 0.81 mg/dL (ref 0.44–1.00)
Chloride: 106 mmol/L (ref 98–111)
GFR calc Af Amer: >60 mL/min (ref >60)
GFR calc non Af Amer: >60 mL/min (ref >60)
GLUCOSE: 133 mg/dL — AB (ref 70–99)
Potassium: 4 mmol/L (ref 3.5–5.1)
Sodium: 138 mmol/L (ref 135–145)

## 2018-02-19 LAB — GLUCOSE, CAPILLARY
GLUCOSE-CAPILLARY: 115 mg/dL — AB (ref 70–99)
Glucose-Capillary: 133 mg/dL — ABNORMAL HIGH (ref 70–99)
Glucose-Capillary: 125 mg/dL — ABNORMAL HIGH (ref 70–99)
Glucose-Capillary: 156 mg/dL — ABNORMAL HIGH (ref 70–99)
Glucose-Capillary: 132 mg/dL — ABNORMAL HIGH (ref 70–99)

## 2018-02-19 LAB — LIPID PANEL
CHOL/HDL RATIO: 6.6 ratio
CHOLESTEROL: 243 mg/dL — AB (ref 0–200)
HDL: 37 mg/dL — AB (ref >40)
LDL CALC: 136 mg/dL — AB (ref 0–99)
TRIGLYCERIDES: 349 mg/dL — AB (ref <150)
VLDL: 70 mg/dL — AB (ref 0–40)

## 2018-02-19 LAB — CBC
HCT: 37.6 % (ref 36.0–46.0)
Hemoglobin: 12.3 g/dL (ref 12.0–15.0)
MCH: 28.3 pg (ref 26.0–34.0)
MCHC: 32.7 g/dL (ref 30.0–36.0)
MCV: 86.6 fL (ref 78.0–100.0)
PLATELETS: 201 10*3/uL (ref 150–400)
RBC: 4.34 MIL/uL (ref 3.87–5.11)
RDW: 13 % (ref 11.5–15.5)
WBC: 5 10*3/uL (ref 4.0–10.5)

## 2018-02-19 LAB — HEMOGLOBIN A1C
Hgb A1c MFr Bld: 9.1 % — ABNORMAL HIGH (ref 4.8–5.6)
Mean Plasma Glucose: 214.47 mg/dL

## 2018-02-19 LAB — HIV ANTIBODY (ROUTINE TESTING W REFLEX): HIV SCREEN 4TH GENERATION: REACTIVE — AB

## 2018-02-19 MED ORDER — ASPIRIN EC 81 MG PO TBEC
81.0000 mg | DELAYED_RELEASE_TABLET | Freq: Every day | ORAL | Status: DC
  Administered 2018-02-20 – 2018-02-23 (×4): 81 mg via ORAL
  Filled 2018-02-19 (×4): qty 1

## 2018-02-19 MED ORDER — ASPIRIN 325 MG PO TABS: 325.0000 mg | ORAL_TABLET | Freq: Every day | ORAL | Status: DC

## 2018-02-19 MED ORDER — INSULIN ASPART 100 UNIT/ML ~~LOC~~ SOLN
0.0000 [IU] | Freq: Three times a day (TID) | SUBCUTANEOUS | Status: DC
  Administered 2018-02-19: 1 [IU] via SUBCUTANEOUS
  Administered 2018-02-19: 2 [IU] via SUBCUTANEOUS
  Administered 2018-02-22: 1 [IU] via SUBCUTANEOUS

## 2018-02-19 MED ORDER — ACETAMINOPHEN 325 MG PO TABS
650.0000 mg | ORAL_TABLET | Freq: Four times a day (QID) | ORAL | Status: DC | PRN
  Administered 2018-02-20: 650 mg via ORAL
  Filled 2018-02-19: qty 2

## 2018-02-19 MED ORDER — ATORVASTATIN CALCIUM 40 MG PO TABS
40.0000 mg | ORAL_TABLET | Freq: Every day | ORAL | Status: DC
  Administered 2018-02-19 – 2018-02-23 (×5): 40 mg via ORAL
  Filled 2018-02-19 (×5): qty 1

## 2018-02-19 MED ORDER — ASPIRIN 325 MG PO TABS
325.0000 mg | ORAL_TABLET | Freq: Every day | ORAL | Status: DC
  Administered 2018-02-19 (×2): 325 mg via ORAL
  Filled 2018-02-19 (×2): qty 1

## 2018-02-19 MED ORDER — SERTRALINE HCL 50 MG PO TABS
25.0000 mg | ORAL_TABLET | Freq: Every day | ORAL | Status: DC
  Administered 2018-02-19 – 2018-02-22 (×4): 25 mg via ORAL
  Filled 2018-02-19 (×4): qty 1

## 2018-02-19 MED ORDER — BUTALBITAL-APAP-CAFFEINE 50-325-40 MG PO TABS
1.0000 | ORAL_TABLET | Freq: Three times a day (TID) | ORAL | Status: DC | PRN
  Administered 2018-02-19 – 2018-02-20 (×2): 1 via ORAL
  Filled 2018-02-19 (×2): qty 1

## 2018-02-19 MED ORDER — GLIPIZIDE 5 MG PO TABS
10.0000 mg | ORAL_TABLET | Freq: Two times a day (BID) | ORAL | Status: DC
  Administered 2018-02-19 – 2018-02-22 (×6): 10 mg via ORAL
  Filled 2018-02-19 (×4): qty 2
  Filled 2018-02-19: qty 1
  Filled 2018-02-19 (×2): qty 2

## 2018-02-19 MED ORDER — CLOPIDOGREL BISULFATE 75 MG PO TABS
75.0000 mg | ORAL_TABLET | Freq: Every day | ORAL | Status: DC
  Administered 2018-02-19 – 2018-02-21 (×3): 75 mg via ORAL
  Filled 2018-02-19 (×3): qty 1

## 2018-02-19 NOTE — Progress Notes (Signed)
STROKE TEAM PROGRESS NOTE   SUBJECTIVE (INTERVAL HISTORY) Her husband is at the bedside.  Overall she feels her condition is much improved. Left UE and LE muscle strength much better. Able to go to bathroom without assistance. Still has left hand and wirst weakness. MRI showed right small BG infarct. CBG stable. Pending CIR>    OBJECTIVE Temp:  [97.6 F (36.4 C)-98.4 F (36.9 C)] 97.8 F (36.6 C) (08/09 0740) Pulse Rate:  [64-91] 90 (08/09 1000) Cardiac Rhythm: Normal sinus rhythm (08/09 0800) Resp:  [14-27] 20 (08/09 1000) BP: (109-172)/(62-98) 134/96 (08/09 1000) SpO2:  [91 %-100 %] 96 % (08/09 1000)  Recent Labs  Lab 02/18/18 1640 02/18/18 2000 02/18/18 2350 02/19/18 0412 02/19/18 0724  GLUCAP 143* 110* 101* 133* 125*   Recent Labs  Lab 02/18/18 0002 02/19/18 0301  NA 136 138  K 3.2* 4.0  CL 102 106  CO2 26 23  GLUCOSE 226* 133*  BUN 14 10  CREATININE 0.76 0.81  CALCIUM 8.7* 9.1   Recent Labs  Lab 02/18/18 0002  AST 44*  ALT 30  ALKPHOS 105  BILITOT 0.6  PROT 7.7  ALBUMIN 3.9   Recent Labs  Lab 02/18/18 0002 02/19/18 0301  WBC 5.2 5.0  NEUTROABS 2.4  --   HGB 12.0 12.3  HCT 35.6 37.6  MCV 85.6 86.6  PLT 209 201   Recent Labs  Lab 02/18/18 0002  TROPONINI <0.03   Recent Labs    02/18/18 0002  LABPROT 11.9  INR 0.88   Recent Labs    02/18/18 0003  COLORURINE COLORLESS*  LABSPEC 1.006  PHURINE 7.0  GLUCOSEU 150*  HGBUR SMALL*  BILIRUBINUR NEGATIVE  KETONESUR NEGATIVE  PROTEINUR NEGATIVE  NITRITE NEGATIVE  LEUKOCYTESUR NEGATIVE       Component Value Date/Time   CHOL 243 (H) 02/19/2018 0301   CHOL 211 (H) 06/22/2014 1432   TRIG 349 (H) 02/19/2018 0301   TRIG 473 (H) 06/22/2014 1432   HDL 37 (L) 02/19/2018 0301   HDL 42 06/22/2014 1432   CHOLHDL 6.6 02/19/2018 0301   VLDL 70 (H) 02/19/2018 0301   VLDL SEE COMMENT 06/22/2014 1432   LDLCALC 136 (H) 02/19/2018 0301   LDLCALC SEE COMMENT 06/22/2014 1432   Lab Results   Component Value Date   HGBA1C 9.1 (H) 02/19/2018      Component Value Date/Time   LABOPIA NONE DETECTED 02/18/2018 0003   COCAINSCRNUR NONE DETECTED 02/18/2018 0003   LABBENZ TEST NOT PERFORMED, REAGENT NOT AVAILABLE (A) 02/18/2018 0003   AMPHETMU NONE DETECTED 02/18/2018 0003   THCU NONE DETECTED 02/18/2018 0003   LABBARB NONE DETECTED 02/18/2018 0003    Recent Labs  Lab 02/18/18 0002  ETH <10    I have personally reviewed the radiological images below and agree with the radiology interpretations.  Ct Angio Head W Or Wo Contrast  Result Date: 02/18/2018 CLINICAL DATA:  Slurred speech and left-sided weakness EXAM: CT ANGIOGRAPHY HEAD TECHNIQUE: Multidetector CT imaging of the head was performed using the standard protocol during bolus administration of intravenous contrast. Multiplanar CT image reconstructions and MIPs were obtained to evaluate the vascular anatomy. CONTRAST:  50m ISOVUE-370 IOPAMIDOL (ISOVUE-370) INJECTION 76% COMPARISON:  Head CT 02/18/2018 FINDINGS: CTA NECK FINDINGS AORTIC ARCH: There is no calcific atherosclerosis of the aortic arch. There is no aneurysm, dissection or hemodynamically significant stenosis of the visualized ascending aorta and aortic arch. Conventional 3 vessel aortic branching pattern. The visualized proximal subclavian arteries are widely patent. RIGHT CAROTID  SYSTEM: --Common carotid artery: Widely patent origin without common carotid artery dissection or aneurysm. --Internal carotid artery: No dissection, occlusion or aneurysm. No hemodynamically significant stenosis. --External carotid artery: No acute abnormality. LEFT CAROTID SYSTEM: --Common carotid artery: Widely patent origin without common carotid artery dissection or aneurysm. --Internal carotid artery:No dissection, occlusion or aneurysm. No hemodynamically significant stenosis. --External carotid artery: No acute abnormality. VERTEBRAL ARTERIES: Codominant configuration. Both origins are  normal. No dissection, occlusion or flow-limiting stenosis to the vertebrobasilar confluence. SKELETON: There is no bony spinal canal stenosis. No lytic or blastic lesion. OTHER NECK: Normal pharynx, larynx and major salivary glands. No cervical lymphadenopathy. Unremarkable thyroid gland. UPPER CHEST: No pneumothorax or pleural effusion. No nodules or masses. CTA HEAD FINDINGS ANTERIOR CIRCULATION: --Intracranial internal carotid arteries: Normal. --Anterior cerebral arteries: Normal. Both A1 segments are present. Patent anterior communicating artery. --Middle cerebral arteries: Normal. --Posterior communicating arteries: Present bilaterally. POSTERIOR CIRCULATION: --Basilar artery: Normal. --Posterior cerebral arteries: Normal. --Superior cerebellar arteries: Normal. --Inferior cerebellar arteries: Normal anterior and posterior inferior cerebellar arteries. VENOUS SINUSES: As permitted by contrast timing, patent. ANATOMIC VARIANTS: None DELAYED PHASE: No parenchymal contrast enhancement. Review of the MIP images confirms the above findings. IMPRESSION: No emergent large vessel occlusion. Normal CTA of the head and neck Electronically Signed   By: Ulyses Jarred M.D.   On: 02/18/2018 03:21   Ct Angio Neck W Or Wo Contrast  Result Date: 02/18/2018 CLINICAL DATA:  Slurred speech and left-sided weakness EXAM: CT ANGIOGRAPHY HEAD TECHNIQUE: Multidetector CT imaging of the head was performed using the standard protocol during bolus administration of intravenous contrast. Multiplanar CT image reconstructions and MIPs were obtained to evaluate the vascular anatomy. CONTRAST:  39m ISOVUE-370 IOPAMIDOL (ISOVUE-370) INJECTION 76% COMPARISON:  Head CT 02/18/2018 FINDINGS: CTA NECK FINDINGS AORTIC ARCH: There is no calcific atherosclerosis of the aortic arch. There is no aneurysm, dissection or hemodynamically significant stenosis of the visualized ascending aorta and aortic arch. Conventional 3 vessel aortic branching  pattern. The visualized proximal subclavian arteries are widely patent. RIGHT CAROTID SYSTEM: --Common carotid artery: Widely patent origin without common carotid artery dissection or aneurysm. --Internal carotid artery: No dissection, occlusion or aneurysm. No hemodynamically significant stenosis. --External carotid artery: No acute abnormality. LEFT CAROTID SYSTEM: --Common carotid artery: Widely patent origin without common carotid artery dissection or aneurysm. --Internal carotid artery:No dissection, occlusion or aneurysm. No hemodynamically significant stenosis. --External carotid artery: No acute abnormality. VERTEBRAL ARTERIES: Codominant configuration. Both origins are normal. No dissection, occlusion or flow-limiting stenosis to the vertebrobasilar confluence. SKELETON: There is no bony spinal canal stenosis. No lytic or blastic lesion. OTHER NECK: Normal pharynx, larynx and major salivary glands. No cervical lymphadenopathy. Unremarkable thyroid gland. UPPER CHEST: No pneumothorax or pleural effusion. No nodules or masses. CTA HEAD FINDINGS ANTERIOR CIRCULATION: --Intracranial internal carotid arteries: Normal. --Anterior cerebral arteries: Normal. Both A1 segments are present. Patent anterior communicating artery. --Middle cerebral arteries: Normal. --Posterior communicating arteries: Present bilaterally. POSTERIOR CIRCULATION: --Basilar artery: Normal. --Posterior cerebral arteries: Normal. --Superior cerebellar arteries: Normal. --Inferior cerebellar arteries: Normal anterior and posterior inferior cerebellar arteries. VENOUS SINUSES: As permitted by contrast timing, patent. ANATOMIC VARIANTS: None DELAYED PHASE: No parenchymal contrast enhancement. Review of the MIP images confirms the above findings. IMPRESSION: No emergent large vessel occlusion. Normal CTA of the head and neck Electronically Signed   By: KUlyses JarredM.D.   On: 02/18/2018 03:21   Mr Brain Wo Contrast  Result Date:  02/18/2018 CLINICAL DATA:  Stroke follow-up.  Slurred speech. EXAM:  MRI HEAD WITHOUT CONTRAST TECHNIQUE: Multiplanar, multiecho pulse sequences of the brain and surrounding structures were obtained without intravenous contrast. COMPARISON:  CTA head neck 02/18/2018 FINDINGS: BRAIN: Small acute infarct of the right basal ganglia along the medial aspect of the globus pallidus. No hemorrhage or mass effect. This is adjacent to the right internal capsule. The midline structures are normal. There are no old infarcts. Minimal white matter hyperintensity, nonspecific and commonly seen in asymptomatic patients of this age. The CSF spaces are normal for age, with no hydrocephalus. Susceptibility-sensitive sequences show no chronic microhemorrhage or superficial siderosis. VASCULAR: Major intracranial arterial and venous sinus flow voids are preserved. SKULL AND UPPER CERVICAL SPINE: The visualized skull base, calvarium, upper cervical spine and extracranial soft tissues are normal. SINUSES/ORBITS: No fluid levels or advanced mucosal thickening. No mastoid or middle ear effusion. The orbits are normal. IMPRESSION: 1. Acute small vessel infarct of the right basal ganglia, in close proximity to the right internal capsule, in keeping with reported left-sided weakness. 2. No hemorrhage or mass effect. Electronically Signed   By: Ulyses Jarred M.D.   On: 02/18/2018 23:49   Ct Head Code Stroke Wo Contrast  Result Date: 02/18/2018 CLINICAL DATA:  Code stroke. Sudden onset left-sided weakness and slurred speech. EXAM: CT HEAD WITHOUT CONTRAST TECHNIQUE: Contiguous axial images were obtained from the base of the skull through the vertex without intravenous contrast. COMPARISON:  None. FINDINGS: Brain: There is no mass, hemorrhage or extra-axial collection. The size and configuration of the ventricles and extra-axial CSF spaces are normal. There is no acute or chronic infarction. The brain parenchyma is normal. Vascular: No abnormal  hyperdensity of the major intracranial arteries or dural venous sinuses. No intracranial atherosclerosis. Skull: The visualized skull base, calvarium and extracranial soft tissues are normal. Sinuses/Orbits: No fluid levels or advanced mucosal thickening of the visualized paranasal sinuses. No mastoid or middle ear effusion. The orbits are normal. ASPECTS [facility] Stroke Program Early CT Score) - Ganglionic level infarction (caudate, lentiform nuclei, internal capsule, insula, M1-M3 cortex): 7 - Supraganglionic infarction (M4-M6 cortex): 3 Total score (0-10 with 10 being normal): 10 IMPRESSION: 1. No hemorrhage or mass effect. 2. ASPECTS is 10. These results were relayed by telephone at the time of interpretation on 02/18/2018 at 12:15 am to Dr. Marjean Donna via the patient's nurse. Electronically Signed   By: Ulyses Jarred M.D.   On: 02/18/2018 00:18   TTE - Left ventricle: The cavity size was normal. Wall thickness was   increased in a pattern of mild LVH. Systolic function was normal.   The estimated ejection fraction was in the range of 55% to 60%.   Wall motion was normal; there were no regional wall motion   abnormalities. Doppler parameters are consistent with abnormal   left ventricular relaxation (grade 1 diastolic dysfunction). Impressions: - No cardiac source of emboli was indentified.   PHYSICAL EXAM  Temp:  [97.6 F (36.4 C)-98.4 F (36.9 C)] 97.8 F (36.6 C) (08/09 0740) Pulse Rate:  [64-91] 90 (08/09 1000) Resp:  [14-27] 20 (08/09 1000) BP: (109-172)/(62-98) 134/96 (08/09 1000) SpO2:  [91 %-100 %] 96 % (08/09 1000)  General - Well nourished, well developed, in no apparent distress.  Ophthalmologic - fundi not visualized due to noncooperation.  Cardiovascular - Regular rate and rhythm.  Mental Status -  Level of arousal and orientation to time, place, and person were intact. Language including expression, naming, repetition, comprehension was assessed and found  intact. Fund of Knowledge was  assessed and was intact.  Cranial Nerves II - XII - II - Visual field intact OU. III, IV, VI - Extraocular movements intact. V - Facial sensation intact bilaterally. VII - left facial droop. VIII - Hearing & vestibular intact bilaterally. X - Palate elevates symmetrically, dysarthria. XI - Chin turning & shoulder shrug intact bilaterally. XII - Tongue protrusion intact.  Motor Strength - The patient's strength was normal in RUE and RLE, however, LUE 4/5 proximal and 3/5 distal and LLE 5-/5 and pronator drift was absent.  Bulk was normal and fasciculations were absent.   Motor Tone - Muscle tone was assessed at the neck and appendages and was normal.  Reflexes - The patient's reflexes were symmetrical in all extremities and she had no pathological reflexes.  Sensory - Light touch, temperature/pinprick were assessed and were symmetrical.    Coordination - The patient had normal movements in the right hand with no ataxia or dysmetria.  Tremor was absent.  Gait and Station - deferred.   ASSESSMENT/PLAN Ms. Logyn Dedominicis is a 62 y.o. female with history of DM, HLD, ulcerative colitis admitted for left facial droop and left hemiparesis. tPA given.   Stroke:  right small BG infarct likely secondary to small vessel disease source  Resultant left facial droop and left hemiparesis  MRI right small BG infarct  CTA head and neck unremarkable  2D Echo EF 55-60%  LDL 136  HgbA1c 9.1  UDS negative  SCDs for VTE prophylaxis  Diet NPO for now  No antithrombotic prior to admission, now on ASA 81 and plavix 75. Continue DAPT for 3 weeks and then ASA alone.   Patient counseled to be compliant with her antithrombotic medications  Ongoing aggressive stroke risk factor management  Therapy recommendations:  CIR  Disposition:  pending  Diabetes  HgbA1c 9.1 goal < 7.0  Uncontrolled  Hyperglycemia  Resume home glipizide  CBG  monitoring  SSI  DM education and close PCP follow up  Hypertension Stable, normal range  Long term BP goal normotensive  Hyperlipidemia  Home meds:  none   LDL 136, goal < 70  On lipitor 40  Continue statin at discharge  Other Stroke Risk Factors  High TG - on lipitor  Other Active Problems  Ulcerative colitis  Hypokalemia - K 3.2 - supplement -> 4.0  Hospital day # 1  This patient is critically ill due to stroke s/p tPA and at significant risk of neurological worsening, death form recurrent stroke, hemorrhagic conversion, brain herination, DKA. This patient's care requires constant monitoring of vital signs, hemodynamics, respiratory and cardiac monitoring, review of multiple databases, neurological assessment, discussion with family, other specialists and medical decision making of high complexity. I spent 35 minutes of neurocritical care time in the care of this patient.   Rosalin Hawking, MD PhD Stroke Neurology 02/19/2018 10:33 AM    To contact Stroke Continuity provider, please refer to [email or web address] After hours, contact General Neurology

## 2018-02-19 NOTE — Progress Notes (Signed)
Transfer report received from 4N at 1215 and pt arrived to the unit via wheelchair with belongings and family to the side 1240. Pt A&O x4; telemetry applied and verified with CCMD: NT called to second verify. Pt oriented to the unit and room; fall/safety precaution and prevention education completed with pt. Pt skin intact with no pressure ulcer or opened wound noted; Bed alarm on and call light within reach. Will continue to closely monitor. Delia Heady RN   02/19/18 1240  Vitals  Temp 98.4 F (36.9 C)  Temp Source Oral  BP 94/73  MAP (mmHg) 81  BP Location Left Arm  BP Method Automatic  Patient Position (if appropriate) Lying  Pulse Rate 83  Pulse Rate Source Dinamap  Resp 18  Oxygen Therapy  SpO2 94 %  O2 Device Room Air

## 2018-02-19 NOTE — Consult Note (Signed)
Physical Medicine and Rehabilitation Consult Reason for Consult: Left-sided weakness and slurred speech Referring Physician: Dr.Xu   HPI: [name] is a 62 y.o. right-handed female with history of asthma, diabetes mellitus, hyperlipidemia, ulcerative colitis.  Patient lives with spouse.  Independent prior to admission.  Daughter can provide assistance during the day and husband at night.  Presented 02/18/2018 to [facility] with left-sided weakness and slurred speech.  Cranial CT scan with no hemorrhage or mass-effect.  Patient did receive TPA.  CT angiogram of head and neck with no emergent large vessel occlusion.  MRI showed acute small vessel infarction of the right basal ganglia, and close proximity to the right internal capsule.  Echocardiogram with ejection fraction of 63% grade 1 diastolic dysfunction.  Neurology follow-up maintained on aspirin for CVA prophylaxis.  Tolerating a regular diet.  Physical and occupational therapy evaluations completed with recommendations of physical medicine rehab consult.   Review of Systems  Constitutional: Negative for fever.       Negative fever chills  HENT: Negative for hearing loss.   Eyes: Negative for pain.  Respiratory: Negative for sputum production.   Cardiovascular:       Negative chest pain or shortness of breath  Gastrointestinal: Negative for vomiting.  Genitourinary: Negative for urgency.       Negative dysuria hematuria or urgency.  Musculoskeletal: Negative for neck pain.  Neurological: Positive for focal weakness.       Focal weakness and slurred speech.  Negative seizures  Psychiatric/Behavioral: Negative for depression.  All other systems reviewed and are negative.  Past Medical History:  Diagnosis Date  . Asthma   . Diabetes mellitus without complication ([place]) 8-75-64   hx. NIDDM-dx. 3 weeks ago  . Hyperlipidemia    hx  . Seasonal allergies   . Ulcerative colitis [facility])    Past  Surgical History:  Procedure Laterality Date  . BUNIONECTOMY     bilateral and toe nails of big toes removed  . CESAREAN SECTION  1997/2006  . CHOLECYSTECTOMY N/A 12/01/2012   Procedure: LAPAROSCOPIC CHOLECYSTECTOMY;  Surgeon: Stark Klein, MD;  Location: [facility] ORS;  Service: General;  Laterality: N/A;  . COLONOSCOPY W/ POLYPECTOMY    . EYE SURGERY Bilateral 2013  . TONSILLECTOMY     Family History  Problem Relation Age of Onset  . Hypertension Mother   . Diabetes Mother   . Cancer Father        lung ca  . Seizures Daughter        33yr   Social History:  reports that she has never smoked. She has never used smokeless tobacco. She reports that she drinks alcohol. She reports that she does not use drugs. Allergies:  Allergies  Allergen Reactions  . Azithromycin Itching    Muscle aches  . Erythromycin Nausea And Vomiting  . Penicillins Nausea And Vomiting    Has patient had a PCN reaction causing immediate rash, facial/tongue/throat swelling, SOB or lightheadedness with hypotension: Yes Has patient had a PCN reaction causing severe rash involving mucus membranes or skin necrosis: No Has patient had a PCN reaction that required hospitalization No Has patient had a PCN reaction occurring within the last 10 years: No If all of the above answers are "NO", then may proceed with Cephalosporin use.   . Tetracycline Nausea And Vomiting   Medications Prior to Admission  Medication Sig Dispense Refill  . albuterol (PROVENTIL HFA;VENTOLIN HFA) 108 (90 Base) MCG/ACT inhaler Inhale 2  puffs into the lungs every 6 (six) hours as needed for wheezing or shortness of breath. 1 Inhaler 0  . glipiZIDE (GLUCOTROL) 10 MG tablet Take 1 tablet (10 mg total) by mouth 2 (two) times daily before a meal. 60 tablet 3  . ondansetron (ZOFRAN) 4 MG tablet Take 1 tablet (4 mg total) by mouth every 8 (eight) hours as needed. 20 tablet 0  . sertraline (ZOLOFT) 25 MG tablet Take 1 tablet (25 mg total) by mouth at  bedtime. (Patient not taking: Reported on 02/18/2018) 30 tablet 2    Home: [place] expects to be discharged to:: Private residence Living Arrangements: Spouse/significant other Available Help at Discharge: Family, Available 24 hours/day(daughter days/husband nights) Type of Home: House Home Access: Level entry Home Layout: Two level Alternate Level Stairs-Number of Steps: 10 Alternate Level Stairs-Rails: Left(going up) Bathroom Shower/Tub: Gaffer, Door [employer] Toilet: Standard Home Equipment: None  Functional History: Prior Function Level of Independence: Independent Functional Status:  Mobility: Bed Mobility Overal bed mobility: Needs Assistance Bed Mobility: Supine to Sit Supine to sit: Mod assist General bed mobility comments: VCs for sequence and technique coming up on left side Transfers Overall transfer level: Needs assistance Equipment used: 2 person hand held assist Transfers: Sit to/from Stand, Stand Pivot Transfers Sit to Stand: Min assist, +2 physical assistance Stand pivot transfers: Mod assist, +2 physical assistance Ambulation/Gait Ambulation/Gait assistance: +2 physical assistance, Mod assist Gait Distance (Feet): 15 Feet(to BR then 30 feet with return to the chair.) Gait Pattern/deviations: Step-to pattern, Step-through pattern General Gait Details: paretic gait on the left with difficulty clearing and advancing L LE equal to that of the R LE Gait velocity interpretation: <1.31 ft/sec, indicative of household ambulator    ADL: ADL Overall ADL's : Needs assistance/impaired Eating/Feeding: Supervision/ safety, Set up Eating/Feeding Details (indicate cue type and reason): supported sitting Grooming: Moderate assistance Grooming Details (indicate cue type and reason): supported sitting Upper Body Bathing: Minimal assistance Upper Body Bathing Details (indicate cue type and reason): supported sitting Lower Body Bathing: Maximal  assistance Lower Body Bathing Details (indicate cue type and reason): min A +2 sit<>stand Upper Body Dressing : Moderate assistance Upper Body Dressing Details (indicate cue type and reason): supported sitting Lower Body Dressing: Maximal assistance Lower Body Dressing Details (indicate cue type and reason): min A sit<>stand Toilet Transfer: Moderate assistance, +2 for safety/equipment, Ambulation, Comfort height toilet, Grab bars Toilet Transfer Details (indicate cue type and reason): Bil HHA Toileting- Clothing Manipulation and Hygiene: Moderate assistance Toileting - Clothing Manipulation Details (indicate cue type and reason): min A +2 sit<>stand  Cognition: Cognition Overall Cognitive Status: Within Functional Limits for tasks assessed Orientation Level: Oriented X4 Cognition Arousal/Alertness: Awake/alert Behavior During Therapy: WFL for tasks assessed/performed Overall Cognitive Status: Within Functional Limits for tasks assessed  Blood pressure (!) 136/92, pulse 74, temperature 98.1 F (36.7 C), temperature source Oral, resp. rate 17, height 5' 2"  (1.575 m), weight 67.7 kg, SpO2 98 %. Physical Exam  Vitals reviewed. Constitutional: She appears well-developed.  Well-developed 62 year old right-handed African-American female  HENT:  Head: Atraumatic.  Eyes: Pupils are equal, round, and reactive to light.  Neck: Normal range of motion.  Cardiovascular: Normal rate.  GI: Soft.  Neurological:  Patient is alert.  She identifies her husband sitting at bedside.  Provides her name and age and follow simple commands. Left central 7, speech sl dysarthric.  R upper and lower 4+ to 5/5. LUE 3+ to 4-/5 prox to distal. LLE 4- to  4/5 prox to distal. Decreased FMC LUE. Senses pain and light touch in all 4. Cognitively intact    Results for orders placed or performed during the hospital encounter of 02/18/18 (from the past 24 hour(s))  MRSA PCR Screening     Status: None   Collection  Time: 02/18/18  6:51 AM  Result Value Ref Range   MRSA by PCR NEGATIVE NEGATIVE  Type and screen [place]     Status: None   Collection Time: 02/18/18  9:07 AM  Result Value Ref Range   ABO/RH(D) A POS    Antibody Screen NEG    Sample Expiration      02/21/2018 Performed at [facility], [place] [address], [place], [place] [address]   Glucose, capillary     Status: Abnormal   Collection Time: 02/18/18 12:32 PM  Result Value Ref Range   Glucose-Capillary 129 (H) 70 - 99 mg/dL  Glucose, capillary     Status: Abnormal   Collection Time: 02/18/18  4:40 PM  Result Value Ref Range   Glucose-Capillary 143 (H) 70 - 99 mg/dL  Glucose, capillary     Status: Abnormal   Collection Time: 02/18/18  8:00 PM  Result Value Ref Range   Glucose-Capillary 110 (H) 70 - 99 mg/dL  Glucose, capillary     Status: Abnormal   Collection Time: 02/18/18 11:50 PM  Result Value Ref Range   Glucose-Capillary 101 (H) 70 - 99 mg/dL  Hemoglobin A1c     Status: Abnormal   Collection Time: 02/19/18  3:01 AM  Result Value Ref Range   Hgb A1c MFr Bld 9.1 (H) 4.8 - 5.6 %   Mean Plasma Glucose 214.47 mg/dL  Lipid panel     Status: Abnormal   Collection Time: 02/19/18  3:01 AM  Result Value Ref Range   Cholesterol 243 (H) 0 - 200 mg/dL   Triglycerides 349 (H) <150 mg/dL   HDL 37 (L) >40 mg/dL   Total CHOL/HDL Ratio 6.6 RATIO   VLDL 70 (H) 0 - 40 mg/dL   LDL Cholesterol 136 (H) 0 - 99 mg/dL  CBC     Status: None   Collection Time: 02/19/18  3:01 AM  Result Value Ref Range   WBC 5.0 4.0 - 10.5 K/uL   RBC 4.34 3.87 - 5.11 MIL/uL   Hemoglobin 12.3 12.0 - 15.0 g/dL   HCT 37.6 36.0 - 46.0 %   MCV 86.6 78.0 - 100.0 fL   MCH 28.3 26.0 - 34.0 pg   MCHC 32.7 30.0 - 36.0 g/dL   RDW 13.0 11.5 - 15.5 %   Platelets 201 150 - 400 K/uL  Basic metabolic panel     Status: Abnormal   Collection Time: 02/19/18  3:01 AM  Result Value Ref Range   Sodium 138 135 - 145 mmol/L   Potassium 4.0 3.5 - 5.1  mmol/L   Chloride 106 98 - 111 mmol/L   CO2 23 22 - 32 mmol/L   Glucose, Bld 133 (H) 70 - 99 mg/dL   BUN 10 8 - 23 mg/dL   Creatinine, Ser 0.81 0.44 - 1.00 mg/dL   Calcium 9.1 8.9 - 10.3 mg/dL   GFR calc non Af Amer >60 >60 mL/min   GFR calc Af Amer >60 >60 mL/min   Anion gap 9 5 - 15  Glucose, capillary     Status: Abnormal   Collection Time: 02/19/18  4:12 AM  Result Value Ref Range  Glucose-Capillary 133 (H) 70 - 99 mg/dL   Ct Angio Head W Or Wo Contrast  Result Date: 02/18/2018 CLINICAL DATA:  Slurred speech and left-sided weakness EXAM: CT ANGIOGRAPHY HEAD TECHNIQUE: Multidetector CT imaging of the head was performed using the standard protocol during bolus administration of intravenous contrast. Multiplanar CT image reconstructions and MIPs were obtained to evaluate the vascular anatomy. CONTRAST:  82m ISOVUE-370 IOPAMIDOL (ISOVUE-370) INJECTION 76% COMPARISON:  Head CT 02/18/2018 FINDINGS: CTA NECK FINDINGS AORTIC ARCH: There is no calcific atherosclerosis of the aortic arch. There is no aneurysm, dissection or hemodynamically significant stenosis of the visualized ascending aorta and aortic arch. Conventional 3 vessel aortic branching pattern. The visualized proximal subclavian arteries are widely patent. RIGHT CAROTID SYSTEM: --Common carotid artery: Widely patent origin without common carotid artery dissection or aneurysm. --Internal carotid artery: No dissection, occlusion or aneurysm. No hemodynamically significant stenosis. --External carotid artery: No acute abnormality. LEFT CAROTID SYSTEM: --Common carotid artery: Widely patent origin without common carotid artery dissection or aneurysm. --Internal carotid artery:No dissection, occlusion or aneurysm. No hemodynamically significant stenosis. --External carotid artery: No acute abnormality. VERTEBRAL ARTERIES: Codominant configuration. Both origins are normal. No dissection, occlusion or flow-limiting stenosis to the vertebrobasilar  confluence. SKELETON: There is no bony spinal canal stenosis. No lytic or blastic lesion. OTHER NECK: Normal pharynx, larynx and major salivary glands. No cervical lymphadenopathy. Unremarkable thyroid gland. UPPER CHEST: No pneumothorax or pleural effusion. No nodules or masses. CTA HEAD FINDINGS ANTERIOR CIRCULATION: --Intracranial internal carotid arteries: Normal. --Anterior cerebral arteries: Normal. Both A1 segments are present. Patent anterior communicating artery. --Middle cerebral arteries: Normal. --Posterior communicating arteries: Present bilaterally. POSTERIOR CIRCULATION: --Basilar artery: Normal. --Posterior cerebral arteries: Normal. --Superior cerebellar arteries: Normal. --Inferior cerebellar arteries: Normal anterior and posterior inferior cerebellar arteries. VENOUS SINUSES: As permitted by contrast timing, patent. ANATOMIC VARIANTS: None DELAYED PHASE: No parenchymal contrast enhancement. Review of the MIP images confirms the above findings. IMPRESSION: No emergent large vessel occlusion. Normal CTA of the head and neck Electronically Signed   By: KUlyses JarredM.D.   On: 02/18/2018 03:21   Ct Angio Neck W Or Wo Contrast  Result Date: 02/18/2018 CLINICAL DATA:  Slurred speech and left-sided weakness EXAM: CT ANGIOGRAPHY HEAD TECHNIQUE: Multidetector CT imaging of the head was performed using the standard protocol during bolus administration of intravenous contrast. Multiplanar CT image reconstructions and MIPs were obtained to evaluate the vascular anatomy. CONTRAST:  712mISOVUE-370 IOPAMIDOL (ISOVUE-370) INJECTION 76% COMPARISON:  Head CT 02/18/2018 FINDINGS: CTA NECK FINDINGS AORTIC ARCH: There is no calcific atherosclerosis of the aortic arch. There is no aneurysm, dissection or hemodynamically significant stenosis of the visualized ascending aorta and aortic arch. Conventional 3 vessel aortic branching pattern. The visualized proximal subclavian arteries are widely patent. RIGHT CAROTID  SYSTEM: --Common carotid artery: Widely patent origin without common carotid artery dissection or aneurysm. --Internal carotid artery: No dissection, occlusion or aneurysm. No hemodynamically significant stenosis. --External carotid artery: No acute abnormality. LEFT CAROTID SYSTEM: --Common carotid artery: Widely patent origin without common carotid artery dissection or aneurysm. --Internal carotid artery:No dissection, occlusion or aneurysm. No hemodynamically significant stenosis. --External carotid artery: No acute abnormality. VERTEBRAL ARTERIES: Codominant configuration. Both origins are normal. No dissection, occlusion or flow-limiting stenosis to the vertebrobasilar confluence. SKELETON: There is no bony spinal canal stenosis. No lytic or blastic lesion. OTHER NECK: Normal pharynx, larynx and major salivary glands. No cervical lymphadenopathy. Unremarkable thyroid gland. UPPER CHEST: No pneumothorax or pleural effusion. No nodules or masses. CTA HEAD FINDINGS  ANTERIOR CIRCULATION: --Intracranial internal carotid arteries: Normal. --Anterior cerebral arteries: Normal. Both A1 segments are present. Patent anterior communicating artery. --Middle cerebral arteries: Normal. --Posterior communicating arteries: Present bilaterally. POSTERIOR CIRCULATION: --Basilar artery: Normal. --Posterior cerebral arteries: Normal. --Superior cerebellar arteries: Normal. --Inferior cerebellar arteries: Normal anterior and posterior inferior cerebellar arteries. VENOUS SINUSES: As permitted by contrast timing, patent. ANATOMIC VARIANTS: None DELAYED PHASE: No parenchymal contrast enhancement. Review of the MIP images confirms the above findings. IMPRESSION: No emergent large vessel occlusion. Normal CTA of the head and neck Electronically Signed   By: Ulyses Jarred M.D.   On: 02/18/2018 03:21   Mr Brain Wo Contrast  Result Date: 02/18/2018 CLINICAL DATA:  Stroke follow-up.  Slurred speech. EXAM: MRI HEAD WITHOUT CONTRAST  TECHNIQUE: Multiplanar, multiecho pulse sequences of the brain and surrounding structures were obtained without intravenous contrast. COMPARISON:  CTA head neck 02/18/2018 FINDINGS: BRAIN: Small acute infarct of the right basal ganglia along the medial aspect of the globus pallidus. No hemorrhage or mass effect. This is adjacent to the right internal capsule. The midline structures are normal. There are no old infarcts. Minimal white matter hyperintensity, nonspecific and commonly seen in asymptomatic patients of this age. The CSF spaces are normal for age, with no hydrocephalus. Susceptibility-sensitive sequences show no chronic microhemorrhage or superficial siderosis. VASCULAR: Major intracranial arterial and venous sinus flow voids are preserved. SKULL AND UPPER CERVICAL SPINE: The visualized skull base, calvarium, upper cervical spine and extracranial soft tissues are normal. SINUSES/ORBITS: No fluid levels or advanced mucosal thickening. No mastoid or middle ear effusion. The orbits are normal. IMPRESSION: 1. Acute small vessel infarct of the right basal ganglia, in close proximity to the right internal capsule, in keeping with reported left-sided weakness. 2. No hemorrhage or mass effect. Electronically Signed   By: Ulyses Jarred M.D.   On: 02/18/2018 23:49   Ct Head Code Stroke Wo Contrast  Result Date: 02/18/2018 CLINICAL DATA:  Code stroke. Sudden onset left-sided weakness and slurred speech. EXAM: CT HEAD WITHOUT CONTRAST TECHNIQUE: Contiguous axial images were obtained from the base of the skull through the vertex without intravenous contrast. COMPARISON:  None. FINDINGS: Brain: There is no mass, hemorrhage or extra-axial collection. The size and configuration of the ventricles and extra-axial CSF spaces are normal. There is no acute or chronic infarction. The brain parenchyma is normal. Vascular: No abnormal hyperdensity of the major intracranial arteries or dural venous sinuses. No intracranial  atherosclerosis. Skull: The visualized skull base, calvarium and extracranial soft tissues are normal. Sinuses/Orbits: No fluid levels or advanced mucosal thickening of the visualized paranasal sinuses. No mastoid or middle ear effusion. The orbits are normal. ASPECTS [facility] Stroke Program Early CT Score) - Ganglionic level infarction (caudate, lentiform nuclei, internal capsule, insula, M1-M3 cortex): 7 - Supraganglionic infarction (M4-M6 cortex): 3 Total score (0-10 with 10 being normal): 10 IMPRESSION: 1. No hemorrhage or mass effect. 2. ASPECTS is 10. These results were relayed by telephone at the time of interpretation on 02/18/2018 at 12:15 am to Dr. Marjean Donna via the patient's nurse. Electronically Signed   By: Ulyses Jarred M.D.   On: 02/18/2018 00:18     Assessment/Plan: Diagnosis: right basal ganglia infarct 1. Does the need for close, 24 hr/day medical supervision in concert with the patient's rehab needs make it unreasonable for this patient to be served in a less intensive setting? Yes 2. Co-Morbidities requiring supervision/potential complications: DM2, hx of UC, post-stroke sequelae 3. Due to bladder management, bowel management, safety, skin/wound  care, disease management, medication administration, pain management and patient education, does the patient require 24 hr/day rehab nursing? Yes 4. Does the patient require coordinated care of a physician, rehab nurse, PT (1-2 hrs/day, 5 days/week), OT (1-2 hrs/day, 5 days/week) and SLP (1-2 hrs/day, 5 days/week) to address physical and functional deficits in the context of the above medical diagnosis(es)? Yes Addressing deficits in the following areas: balance, endurance, locomotion, strength, transferring, bowel/bladder control, bathing, dressing, feeding, grooming, toileting, cognition, speech and psychosocial support 5. Can the patient actively participate in an intensive therapy program of at least 3 hrs of therapy per day at least 5  days per week? Yes 6. The potential for patient to make measurable gains while on inpatient rehab is excellent 7. Anticipated functional outcomes upon discharge from inpatient rehab are modified independent  with PT, modified independent with OT, modified independent with SLP. 8. Estimated rehab length of stay to reach the above functional goals is: 7-10 days 9. Anticipated D/C setting: Home 10. Anticipated post D/C treatments: HH therapy and Outpatient therapy 11. Overall Rehab/Functional Prognosis: excellent  RECOMMENDATIONS: This patient's condition is appropriate for continued rehabilitative care in the following setting: CIR Patient has agreed to participate in recommended program. Yes Note that insurance prior authorization may be required for reimbursement for recommended care.  Comment: Pt is motivated. Has flight of stairs to bedroom/shower at home. Will do well in intensive rehab setting. Rehab Admissions Coordinator to follow up.  Thanks,  Meredith Staggers, MD, Mellody Drown  I have personally performed a face to face diagnostic evaluation of this patient. Additionally, I have reviewed and concur with the physician assistant's documentation above.    Lavon Paganini Angiulli, PA-C 02/19/2018

## 2018-02-19 NOTE — Progress Notes (Addendum)
ID PROGRESS NOTE  HIV antibody testing is positive. Will check HIV viral load and CD 4 count today. Will see the patient formally on Sunday once CD 4 count and VL maybe return by then, to discuss work up and disclosing test results. Potentially false positive.Tested negative 10 years ago.  [name] for Infectious Diseases [phone]

## 2018-02-19 NOTE — Progress Notes (Signed)
  Speech Language Pathology Treatment: Dysphagia  Patient Details Name: [name] MRN: [id number] DOB: 10-29-1955 Today's Date: 02/19/2018 Time: 0929-5747 SLP Time Calculation (min) (ACUTE ONLY): 16 min  Assessment / Plan / Recommendation Clinical Impression  Pt seen with fried hashbrowns, able to masticate with extra care and attention to labial seal. Pt somewhat reclined and took a sip with hashbrown still in her mouth with protective coughing. We reviewed precautions and strategies to improve oral control and oral hygiene. Pt may continue regualr diet and thin liquids.   HPI HPI: [name] is a 62 y.o. female with history of DM, HLD, ulcerative colitis admitted for left facial droop and left hemiparesis. tPA given. right subcortical infarct       SLP Plan  All goals met  All further Speech Lanaguage Pathology  needs can be addressed in the next venue of care    Recommendations  Diet recommendations: Regular;Thin liquid Liquids provided via: Straw Supervision: Patient able to self feed Compensations: Lingual sweep for clearance of pocketing Postural Changes and/or Swallow Maneuvers: Seated upright 90 degrees                Follow up Recommendations: Outpatient SLP SLP Visit Diagnosis: Dysphagia, oral phase (R13.11) Plan: All goals met       GO               Herbie Baltimore, MA CCC-SLP [phone]  Lynann Beaver 02/19/2018, 12:22 PM

## 2018-02-19 NOTE — Progress Notes (Signed)
Inpatient Rehabilitation  Please see consult by Dr. Naaman Plummer for full details. Plan to continue to follow for timing of medical readiness, patient decision, insurance authorization (BCBS can be initiated Monday 8/12 after therapy updates), and IP Rehab bed availability.  Plan to follow up Monday 8/12.    Carmelia Roller., CCC/SLP Admission Coordinator  [place]  Cell [phone]

## 2018-02-19 NOTE — Evaluation (Signed)
Speech Language Pathology Evaluation Patient Details Name: [name] MRN: [id number] DOB: 02/04/1956 Today's Date: 02/19/2018 Time: 3646-8032 SLP Time Calculation (min) (ACUTE ONLY): 16 min  Problem List:  Patient Active Problem List   Diagnosis Date Noted  . Stroke (cerebrum) ([place]) 02/18/2018  . HLD (hyperlipidemia) 08/02/2014  . Type 2 diabetes mellitus ([place]) 07/31/2014  . ULCERATIVE COLITIS 06/26/2008   Past Medical History:  Past Medical History:  Diagnosis Date  . Asthma   . Diabetes mellitus without complication ([place]) 08-04-46   hx. NIDDM-dx. 3 weeks ago  . Hyperlipidemia    hx  . Seasonal allergies   . Ulcerative colitis [facility])    Past Surgical History:  Past Surgical History:  Procedure Laterality Date  . BUNIONECTOMY     bilateral and toe nails of big toes removed  . CESAREAN SECTION  1997/2006  . CHOLECYSTECTOMY N/A 12/01/2012   Procedure: LAPAROSCOPIC CHOLECYSTECTOMY;  Surgeon: Stark Klein, MD;  Location: [facility];  Service: General;  Laterality: N/A;  . COLONOSCOPY W/ POLYPECTOMY    . EYE SURGERY Bilateral 2013  . TONSILLECTOMY     HPI:  Ms. Estel Tonelli is a 62 y.o. female with history of DM, HLD, ulcerative colitis admitted for left facial droop and left hemiparesis. tPA given. right subcortical infarct    Assessment / Plan / Recommendation Clinical Impression  Pt demonstrates mild persistent dysarthria. Introduced compensatory strategies, pt return demonstrated. Recommed f/u for speech therapy at Outpatient setting. Pt in agreement.     SLP Assessment  SLP Recommendation/Assessment: All further Speech Lanaguage Pathology  needs can be addressed in the next venue of care SLP Visit Diagnosis: Dysarthria and anarthria (R47.1)    Follow Up Recommendations  Outpatient SLP    Frequency and Duration           SLP Evaluation Cognition  Overall Cognitive Status: Within Functional Limits for tasks assessed Orientation Level: Oriented X4        Comprehension  Auditory Comprehension Overall Auditory Comprehension: Appears within functional limits for tasks assessed    Expression Verbal Expression Overall Verbal Expression: Appears within functional limits for tasks assessed Written Expression Dominant Hand: Right   Oral / Motor  Oral Motor/Sensory Function Overall Oral Motor/Sensory Function: Mild impairment Facial ROM: Within Functional Limits Facial Symmetry: Abnormal symmetry left Facial Strength: Reduced left Facial Sensation: Within Functional Limits Lingual ROM: Within Functional Limits Lingual Symmetry: Within Functional Limits Lingual Strength: Within Functional Limits Lingual Sensation: Within Functional Limits Velum: Within Functional Limits Mandible: Within Functional Limits Motor Speech Overall Motor Speech: Impaired Respiration: Within functional limits Phonation: Normal Resonance: Within functional limits Articulation: Impaired Level of Impairment: Conversation Intelligibility: Intelligible Motor Planning: Witnin functional limits Motor Speech Errors: Aware Effective Techniques: Slow rate;Increased vocal intensity;Over-articulate   GO                   Herbie [place], [place] CCC-SLP [phone]   Lynann Beaver 02/19/2018, 12:10 PM

## 2018-02-20 DIAGNOSIS — I10 Essential (primary) hypertension: Secondary | ICD-10-CM

## 2018-02-20 LAB — BASIC METABOLIC PANEL
ANION GAP: 14 (ref 5–15)
BUN: 14 mg/dL (ref 8–23)
CO2: 18 mmol/L — AB (ref 22–32)
Calcium: 9.5 mg/dL (ref 8.9–10.3)
Chloride: 104 mmol/L (ref 98–111)
Creatinine, Ser: 1.03 mg/dL — ABNORMAL HIGH (ref 0.44–1.00)
GFR calc Af Amer: >60 mL/min (ref >60)
GFR, EST NON AFRICAN AMERICAN: 57 mL/min — AB (ref >60)
GLUCOSE: 115 mg/dL — AB (ref 70–99)
Potassium: 3.6 mmol/L (ref 3.5–5.1)
Sodium: 136 mmol/L (ref 135–145)

## 2018-02-20 LAB — CBC
HCT: 39.1 % (ref 36.0–46.0)
Hemoglobin: 12.6 g/dL (ref 12.0–15.0)
MCH: 28.1 pg (ref 26.0–34.0)
MCHC: 32.2 g/dL (ref 30.0–36.0)
MCV: 87.3 fL (ref 78.0–100.0)
PLATELETS: 213 10*3/uL (ref 150–400)
RBC: 4.48 MIL/uL (ref 3.87–5.11)
RDW: 13.2 % (ref 11.5–15.5)
WBC: 6.2 10*3/uL (ref 4.0–10.5)

## 2018-02-20 LAB — GLUCOSE, CAPILLARY
GLUCOSE-CAPILLARY: 108 mg/dL — AB (ref 70–99)
GLUCOSE-CAPILLARY: 74 mg/dL (ref 70–99)
Glucose-Capillary: 99 mg/dL (ref 70–99)
Glucose-Capillary: 91 mg/dL (ref 70–99)

## 2018-02-20 LAB — HIV-1 RNA QUANT-NO REFLEX-BLD
HIV 1 RNA Quant: 118000 copies/mL
LOG10 HIV-1 RNA: 5.072 log10copy/mL

## 2018-02-20 LAB — HEPATITIS C ANTIBODY: HCV AB: 0.2 {s_co_ratio} (ref 0.0–0.9)

## 2018-02-20 LAB — HEPATITIS B SURFACE ANTIGEN: HEP B S AG: NEGATIVE

## 2018-02-20 LAB — HEPATITIS B SURFACE ANTIBODY,QUALITATIVE: HEP B S AB: NONREACTIVE

## 2018-02-20 MED ORDER — TOPIRAMATE 25 MG PO TABS
50.0000 mg | ORAL_TABLET | Freq: Every day | ORAL | Status: DC
  Administered 2018-02-20 – 2018-02-22 (×3): 50 mg via ORAL
  Filled 2018-02-20 (×3): qty 2

## 2018-02-20 MED ORDER — TOPIRAMATE 25 MG PO TABS: 25.0000 mg | ORAL_TABLET | Freq: Every day | ORAL | Status: DC

## 2018-02-20 MED ORDER — TOPIRAMATE 25 MG PO TABS: 50.0000 mg | ORAL_TABLET | Freq: Two times a day (BID) | ORAL | Status: DC

## 2018-02-20 NOTE — Progress Notes (Signed)
STROKE TEAM PROGRESS NOTE   SUBJECTIVE (INTERVAL HISTORY) Her daughter is at the bedside.  No acute event overnight. Neuro stable. LUE continue to improve. LLE near normal. PT/OT recommend CIR. Pending insurance approval on Monday.   Pt continues to complains of HA and stated that she has hx of migraine and once a week, sometimes bad needs to go to ER for IV meds, which is about once every 2-3 months. Not on any preventive meds at home. This time seems her migraine continued from yesterday. On fioricet and tylenol but not effective. Will add topamax.    OBJECTIVE Temp:  [98.1 F (36.7 C)-100.2 F (37.9 C)] 98.4 F (36.9 C) (08/10 1138) Pulse Rate:  [81-105] 89 (08/10 1138) Cardiac Rhythm: Normal sinus rhythm (08/10 0700) Resp:  [15-18] 16 (08/10 1138) BP: (115-149)/(55-86) 132/85 (08/10 1138) SpO2:  [95 %-100 %] 98 % (08/10 1138)  Recent Labs  Lab 02/19/18 1147 02/19/18 1630 02/19/18 2118 02/20/18 0659 02/20/18 1135  GLUCAP 156* 132* 115* 108* 99   Recent Labs  Lab 02/18/18 0002 02/19/18 0301 02/20/18 0521  NA 136 138 136  K 3.2* 4.0 3.6  CL 102 106 104  CO2 26 23 18*  GLUCOSE 226* 133* 115*  BUN 14 10 14   CREATININE 0.76 0.81 1.03*  CALCIUM 8.7* 9.1 9.5   Recent Labs  Lab 02/18/18 0002  AST 44*  ALT 30  ALKPHOS 105  BILITOT 0.6  PROT 7.7  ALBUMIN 3.9   Recent Labs  Lab 02/18/18 0002 02/19/18 0301 02/20/18 0521  WBC 5.2 5.0 6.2  NEUTROABS 2.4  --   --   HGB 12.0 12.3 12.6  HCT 35.6 37.6 39.1  MCV 85.6 86.6 87.3  PLT 209 201 213   Recent Labs  Lab 02/18/18 0002  TROPONINI <0.03   Recent Labs    02/18/18 0002  LABPROT 11.9  INR 0.88   Recent Labs    02/18/18 0003  COLORURINE COLORLESS*  LABSPEC 1.006  PHURINE 7.0  GLUCOSEU 150*  HGBUR SMALL*  BILIRUBINUR NEGATIVE  KETONESUR NEGATIVE  PROTEINUR NEGATIVE  NITRITE NEGATIVE  LEUKOCYTESUR NEGATIVE       Component Value Date/Time   CHOL 243 (H) 02/19/2018 0301   CHOL 211 (H)  06/22/2014 1432   TRIG 349 (H) 02/19/2018 0301   TRIG 473 (H) 06/22/2014 1432   HDL 37 (L) 02/19/2018 0301   HDL 42 06/22/2014 1432   CHOLHDL 6.6 02/19/2018 0301   VLDL 70 (H) 02/19/2018 0301   VLDL SEE COMMENT 06/22/2014 1432   LDLCALC 136 (H) 02/19/2018 0301   LDLCALC SEE COMMENT 06/22/2014 1432   Lab Results  Component Value Date   HGBA1C 9.1 (H) 02/19/2018      Component Value Date/Time   LABOPIA NONE DETECTED 02/18/2018 0003   COCAINSCRNUR NONE DETECTED 02/18/2018 0003   LABBENZ TEST NOT PERFORMED, REAGENT NOT AVAILABLE (A) 02/18/2018 0003   AMPHETMU NONE DETECTED 02/18/2018 0003   THCU NONE DETECTED 02/18/2018 0003   LABBARB NONE DETECTED 02/18/2018 0003    Recent Labs  Lab 02/18/18 0002  ETH <10   IMAGING  I have personally reviewed the radiological images below and agree with the radiology interpretations.  Ct Angio Head W Or Wo Contrast Ct Angio Neck W Or Wo Contrast 02/18/2018 IMPRESSION:  No emergent large vessel occlusion. Normal CTA of the head and neck    Mr Brain Wo Contrast 02/18/2018 IMPRESSION:  1. Acute small vessel infarct of the right basal ganglia, in close proximity  to the right internal capsule, in keeping with reported left-sided weakness.  2. No hemorrhage or mass effect.    Ct Head Code Stroke Wo Contrast 02/18/2018 IMPRESSION:  1. No hemorrhage or mass effect.  2. ASPECTS is 10.    TTE - Left ventricle: The cavity size was normal. Wall thickness was   increased in a pattern of mild LVH. Systolic function was normal.   The estimated ejection fraction was in the range of 55% to 60%.   Wall motion was normal; there were no regional wall motion   abnormalities. Doppler parameters are consistent with abnormal   left ventricular relaxation (grade 1 diastolic dysfunction). Impressions: - No cardiac source of emboli was indentified.   PHYSICAL EXAM  Temp:  [98.1 F (36.7 C)-100.2 F (37.9 C)] 98.4 F (36.9 C) (08/10 1138) Pulse  Rate:  [81-105] 89 (08/10 1138) Resp:  [15-18] 16 (08/10 1138) BP: (115-149)/(55-86) 132/85 (08/10 1138) SpO2:  [95 %-100 %] 98 % (08/10 1138)  General - Well nourished, well developed, in no apparent distress.  Ophthalmologic - fundi not visualized due to noncooperation.  Cardiovascular - Regular rate and rhythm.  Mental Status -  Level of arousal and orientation to time, place, and person were intact. Language including expression, naming, repetition, comprehension was assessed and found intact. Fund of Knowledge was assessed and was intact.  Cranial Nerves II - XII - II - Visual field intact OU. III, IV, VI - Extraocular movements intact. V - Facial sensation intact bilaterally. VII - mild left facial droop. VIII - Hearing & vestibular intact bilaterally. X - Palate elevates symmetrically, mild dysarthria. XI - Chin turning & shoulder shrug intact bilaterally. XII - Tongue protrusion intact.  Motor Strength - The patient's strength was normal in RUE and RLE, however, LUE 4+/5 proximal and 3/5 distal and LLE 5-/5 and pronator drift was absent.  Bulk was normal and fasciculations were absent.   Motor Tone - Muscle tone was assessed at the neck and appendages and was normal.  Reflexes - The patient's reflexes were symmetrical in all extremities and she had no pathological reflexes.  Sensory - Light touch, temperature/pinprick were assessed and were symmetrical.    Coordination - The patient had normal movements in the right hand with no ataxia or dysmetria.  Tremor was absent.  Gait and Station - deferred.   ASSESSMENT/PLAN [name] is a 62 y.o. female with history of DM, HLD, ulcerative colitis admitted for left facial droop and left hemiparesis. tPA given.   Stroke:  right small BG infarct likely secondary to small vessel disease source  Resultant left facial droop and left hemiparesis  MRI right small BG infarct  CTA head and neck unremarkable  2D Echo  EF 55-60%  LDL 136  HgbA1c 9.1  UDS negative  SCDs for VTE prophylaxis  Diet NPO for now  No antithrombotic prior to admission, now on ASA 81 and plavix 75. Continue DAPT for 3 weeks and then ASA alone.   Patient counseled to be compliant with her antithrombotic medications  Ongoing aggressive stroke risk factor management  Therapy recommendations:  CIR  Disposition:  Pending CIR  Diabetes  HgbA1c 9.1 goal < 7.0  Uncontrolled  Hyperglycemia  Resume home glipizide  CBG monitoring  SSI  DM education and close PCP follow up  Hypertension Stable, normal range  Long term BP goal normotensive  Hyperlipidemia  Home meds:  none   LDL 136, goal < 70  On lipitor 40  Continue statin at discharge  Migraine   Hx of migraine   HA for the last 2 days  On fioricet and tylenol PRN  Add topamax 63m Qhs.   Consider to follow up with Dr. AJaynee Eaglesas outpt  HIV positive  New diagnosis  HIV positive on serum testing  ID consulted  Viral load and CD4 pending  HBV and HCV negative  Need outpt ID follow up and start HARRT   Other Stroke Risk Factors  High TG - on lipitor  Other Active Problems  Ulcerative colitis  Hypokalemia - K 3.2 - supplement -> 4.0  Topamax started today. Dosage adjusted for decreased creatinine clearance per pharmacist's recommendations.   Elevated creatinine 1.03  PLAN  Potential CIR admission Monday.  Hospital day # 2  JRosalin Hawking MD PhD Stroke Neurology 02/20/2018 5:24 PM   To contact Stroke Continuity provider, please refer to [email or web address] After hours, contact General Neurology

## 2018-02-20 NOTE — Progress Notes (Signed)
Occupational Therapy Treatment Patient Details Name: [name] MRN: [id number] DOB: September 25, 1955 Today's Date: 02/20/2018    History of present illness [name] is an 62 y.o. female with past medical history of diabetes mellitus, hyperlipidemia, ulcerative colitis, asthma presents to the emergency department at [facility] as a code stroke for sudden onset slurred speech and left-sided weakness.   OT comments  Pt. Seen for OT tx. Session with focus on HEP for strengthening and functional use of LUE.  theraband and theraputty provided with demo and return demo for use of each.  Pt. Very motivated and actively engaged in session.  Sister present and is a strong support, and they report pts. Husband also very involved.  Pt. Remains an excellent candidate for CIR level therapies to cont. Strength and safety with functional mobility and ADLs prior to home.   Follow Up Recommendations  CIR;Supervision/Assistance - 24 hour    Equipment Recommendations  Other (comment)    Recommendations for Other Services Rehab consult    Precautions / Restrictions Precautions Precautions: Fall       Mobility Bed Mobility                  Transfers                      Balance                                           ADL either performed or assessed with clinical judgement   ADL Overall ADL's : Needs assistance/impaired     Grooming: Moderate assistance Grooming Details (indicate cue type and reason): supported sitting attempting to don earrings. difficulty secondary to very small earring backs and noted difficulty with fine motor coordination of L hand                               General ADL Comments: sister present and had assisted with ub/lb b/d prior to my arrival.  strong family support reported, spouse also available to assist.     Vision       Perception     Praxis      Cognition Arousal/Alertness:  Awake/alert Behavior During Therapy: WFL for tasks assessed/performed Overall Cognitive Status: Within Functional Limits for tasks assessed                                          Exercises Other Exercises Other Exercises: provided orange theraband with demo/review of UE exercies.  provided yellow theraputty and handouts. demo and return demo of fine motor HEP with use of theraputty.  also reviewed need for B hand integration with examples including selfcare containers, food packages and containers, turning pages in magazines ect.    Shoulder Instructions       General Comments  hobbies include: singing (reports it is her passion and wants to have her voice back), making wigs, and some cooking.  Loves her long nails but I did review they may need to be trimmed to aide in functionality.      Pertinent Vitals/ Pain          Home Living  Prior Functioning/Environment              Frequency  Min 3X/week        Progress Toward Goals  OT Goals(current goals can now be found in the care plan section)  Progress towards OT goals: Progressing toward goals     Plan      Co-evaluation                 AM-PAC PT "6 Clicks" Daily Activity     Outcome Measure   Help from another person eating meals?: A Little Help from another person taking care of personal grooming?: A Little Help from another person toileting, which includes using toliet, bedpan, or urinal?: A Lot Help from another person bathing (including washing, rinsing, drying)?: A Lot Help from another person to put on and taking off regular upper body clothing?: A Lot Help from another person to put on and taking off regular lower body clothing?: A Lot 6 Click Score: 14    End of Session    OT Visit Diagnosis: Unsteadiness on feet (R26.81);Other abnormalities of gait and mobility (R26.89);Muscle weakness (generalized)  (M62.81);Pain;Hemiplegia and hemiparesis Hemiplegia - Right/Left: Left Hemiplegia - dominant/non-dominant: Non-Dominant Hemiplegia - caused by: Cerebral infarction Pain - Right/Left: Left Pain - part of body: Arm   Activity Tolerance Patient tolerated treatment well   Patient Left in bed;with call bell/phone within reach;with family/visitor present   Nurse Communication          Time: 4715-9539 OT Time Calculation (min): 31 min  Charges: OT General Charges $OT Visit: 1 Visit OT Treatments $Self Care/Home Management : 23-37 mins   Janice Coffin, COTA/L 02/20/2018, 11:40 AM

## 2018-02-20 NOTE — Progress Notes (Signed)
Physical Therapy Treatment Patient Details Name: [name] MRN: [id number] DOB: 1955-10-01 Today's Date: 02/20/2018    History of Present Illness Eriyanna Kofoed is an 62 y.o. female with past medical history of diabetes mellitus, hyperlipidemia, ulcerative colitis, asthma presents to the emergency department at [facility] as a code stroke for sudden onset slurred speech and left-sided weakness.    PT Comments    Pt has made significant improvement.  Noticeably more stability and stamina with gait.   Follow Up Recommendations  CIR     Equipment Recommendations  None recommended by PT    Recommendations for Other Services Rehab consult     Precautions / Restrictions Precautions Precautions: Fall    Mobility  Bed Mobility Overal bed mobility: Needs Assistance Bed Mobility: Supine to Sit;Sit to Supine     Supine to sit: Min guard Sit to supine: Min guard   General bed mobility comments: mild struggle overall, but no assist needed with minimally raised HOB.  Transfers Overall transfer level: Needs assistance   Transfers: Sit to/from Stand Sit to Stand: Min assist            Ambulation/Gait Ambulation/Gait assistance: Min assist Gait Distance (Feet): 100 Feet(x2 with rest in between) Assistive device: None Gait Pattern/deviations: Step-through pattern     General Gait Details: mildly hemiparetic gait, drifting with scanning.  Pt with ability to speed up noticeably if not significantly.  Trunk held guardedly with speed changes.   Stairs             Wheelchair Mobility    Modified Rankin (Stroke Patients Only) Modified Rankin (Stroke Patients Only) Pre-Morbid Rankin Score: No symptoms Modified Rankin: Moderately severe disability     Balance Overall balance assessment: Needs assistance Sitting-balance support: No upper extremity supported;Feet supported Sitting balance-Leahy Scale: Fair       Standing balance-Leahy  Scale: Fair Standing balance comment: able to stand statically without assist                            Cognition Arousal/Alertness: Awake/alert Behavior During Therapy: WFL for tasks assessed/performed Overall Cognitive Status: Within Functional Limits for tasks assessed                                        Exercises      General Comments        Pertinent Vitals/Pain Pain Assessment: Faces Faces Pain Scale: Hurts a little bit Pain Location: left arm at rest (appears due to tone) Pain Descriptors / Indicators: Aching;Sore Pain Intervention(s): Monitored during session    Home Living                      Prior Function            PT Goals (current goals can now be found in the care plan section) Acute Rehab PT Goals Patient Stated Goal: to rehab then home PT Goal Formulation: With patient Time For Goal Achievement: 03/04/18 Potential to Achieve Goals: Good Progress towards PT goals: Progressing toward goals    Frequency    Min 4X/week      PT Plan Current plan remains appropriate    Co-evaluation              AM-PAC PT "6 Clicks" Daily Activity  Outcome Measure  Difficulty turning over in bed (  including adjusting bedclothes, sheets and blankets)?: A Little Difficulty moving from lying on back to sitting on the side of the bed? : A Little Difficulty sitting down on and standing up from a chair with arms (e.g., wheelchair, bedside commode, etc,.)?: Unable Help needed moving to and from a bed to chair (including a wheelchair)?: A Little Help needed walking in hospital room?: A Little Help needed climbing 3-5 steps with a railing? : A Little 6 Click Score: 16    End of Session   Activity Tolerance: Patient tolerated treatment well Patient left: in bed;with call bell/phone within reach Nurse Communication: Mobility status PT Visit Diagnosis: Unsteadiness on feet (R26.81);Hemiplegia and  hemiparesis;Pain Hemiplegia - Right/Left: Left Hemiplegia - dominant/non-dominant: Non-dominant Hemiplegia - caused by: Cerebral infarction Pain - Right/Left: Left Pain - part of body: Arm     Time: 1643-5391 PT Time Calculation (min) (ACUTE ONLY): 16 min  Charges:  $Gait Training: 8-22 mins                     02/20/2018  Donnella Sham, PT [phone]  (pager)   Tessie Fass Jaelani Posa 02/20/2018, 5:12 PM

## 2018-02-21 LAB — BASIC METABOLIC PANEL
Anion gap: 10 (ref 5–15)
BUN: 16 mg/dL (ref 8–23)
CHLORIDE: 105 mmol/L (ref 98–111)
CO2: 22 mmol/L (ref 22–32)
CREATININE: 0.94 mg/dL (ref 0.44–1.00)
Calcium: 9.3 mg/dL (ref 8.9–10.3)
GFR calc non Af Amer: >60 mL/min (ref >60)
Glucose, Bld: 86 mg/dL (ref 70–99)
POTASSIUM: 3.3 mmol/L — AB (ref 3.5–5.1)
Sodium: 137 mmol/L (ref 135–145)

## 2018-02-21 LAB — GLUCOSE, CAPILLARY
GLUCOSE-CAPILLARY: 83 mg/dL (ref 70–99)
GLUCOSE-CAPILLARY: 87 mg/dL (ref 70–99)
GLUCOSE-CAPILLARY: 77 mg/dL (ref 70–99)
GLUCOSE-CAPILLARY: 84 mg/dL (ref 70–99)

## 2018-02-21 LAB — CBC
HEMATOCRIT: 37.2 % (ref 36.0–46.0)
HEMOGLOBIN: 12 g/dL (ref 12.0–15.0)
MCH: 28 pg (ref 26.0–34.0)
MCHC: 32.3 g/dL (ref 30.0–36.0)
MCV: 86.7 fL (ref 78.0–100.0)
Platelets: 202 10*3/uL (ref 150–400)
RBC: 4.29 MIL/uL (ref 3.87–5.11)
RDW: 13.2 % (ref 11.5–15.5)
WBC: 4.4 10*3/uL (ref 4.0–10.5)

## 2018-02-21 LAB — CRYPTOCOCCAL ANTIGEN: CRYPTO AG: NEGATIVE

## 2018-02-21 MED ORDER — POTASSIUM CHLORIDE CRYS ER 20 MEQ PO TBCR
20.0000 meq | EXTENDED_RELEASE_TABLET | Freq: Two times a day (BID) | ORAL | Status: AC
  Administered 2018-02-21 – 2018-02-22 (×4): 20 meq via ORAL
  Filled 2018-02-21 (×4): qty 1

## 2018-02-21 NOTE — PMR Pre-admission (Signed)
PMR Admission Coordinator Pre-Admission Assessment  Patient: [name] is an 62 y.o., female MRN: [id number] DOB: 12/28/1955 Height: 5' 2"  (157.5 cm) Weight: 67.7 kg              Insurance Information HMO:     PPO: X    PCP:        IPA:       80/20:        OTHER :   PRIMARY: [employer]   Policy#: [id number]        Subscriber: Spouse Benefits:   Phone #: [phone]       Name: Reference #[id number] Eff. Date: 07/14/17    Deduct: $3000   Out of Pocket Max: $6000      Life Max: N/A CM Name: Case Manager      Phone#:      Fax#: [phone] Pre-Cert#: [id number] for 02/22/18-03/01/18 with faxed updates due 02/28/18     Employer: Spouse's CIR: 80%/20%      SNF: 80%/20% Outpatient: 80%     Co-Pay: 20% Home Health: 80%      Co-Pay: 20% DME: 80%     Co-Pay: 20% Providers: In-network  SECONDARY: [facility]  Policy#: [id number]        Subscriber: Spouse  Benefits:  Phone #: [phone]  Name: Verified online [email or web address]  Eff. Date: 02/22/17    Deduct:        Out of Pocket Max:         Life Max:    [place] secondary as of 07/14/16; No authorization is needed if BCBS approves.      Emergency Contact Information Contact Information    Name Relation Home Work [place] Daughter [phone]     Cammon,Christopher Son [phone]  [phone]     Current Medical History  Patient Admitting Diagnosis: Right basal ganglia infarct  History of Present Illness: [name] is a 62 year old right-handed female with history of asthma, diabetes mellitus, ulcerative colitis.  Lives with spouse independent prior to admission.  Daughter can provide assistance during the day and husband at night.  Presented 02/18/2018 to [facility] with left-sided weakness and slurred speech.  Cranial CT scan with no hemorrhage or mass-effect.  Patient did receive TPA.  CT angiogram of head and neck with no emergent  large vessel occlusion.  MRI showed small vessel infarction of the right basal ganglia and close proximity to the right internal capsule.  Echocardiogram with ejection fraction of 90% grade 1 diastolic dysfunction.  Neurology consulted maintained on aspirin and Plavix x3 weeks then aspirin alone.  Tolerating a regular diet.  As part of patient's admission for CVA she was tested for HIV disease which was positive.  Infectious disease consulted and plan HIV treatment with CD4 28.  Physical and occupational therapy evaluations completed patient was admitted for a comprehensive rehab program 02/23/18.  Complete NIHSS TOTAL: 3    Past Medical History  Past Medical History:  Diagnosis Date  . Asthma   . Diabetes mellitus without complication ([place]) 08-24-13   hx. NIDDM-dx. 3 weeks ago  . Hyperlipidemia    hx  . Seasonal allergies   . Ulcerative colitis ([place])     Family History  family history includes Cancer in her father; Diabetes in her mother; Hypertension in her mother; Seizures in her daughter.  Prior Rehab/Hospitalizations:  Has the patient had major surgery during 100 days prior to admission? No  Current Medications   Current  Facility-Administered Medications:  .  acetaminophen (TYLENOL) tablet 650 mg, 650 mg, Oral, Q6H PRN, 650 mg at 02/20/18 0123 **OR** [DISCONTINUED] acetaminophen (TYLENOL) solution 650 mg, 650 mg, Per Tube, Q4H PRN **OR** [DISCONTINUED] acetaminophen (TYLENOL) suppository 650 mg, 650 mg, Rectal, Q4H PRN, Aroor, Karena Addison R, MD .  albuterol (PROVENTIL) (2.5 MG/3ML) 0.083% nebulizer solution 3 mL, 3 mL, Inhalation, Q6H PRN, Aroor, Karena Addison R, MD .  aspirin EC tablet 81 mg, 81 mg, Oral, Daily, Rosalin Hawking, MD, 81 mg at 02/23/18 0938 .  atorvastatin (LIPITOR) tablet 40 mg, 40 mg, Oral, q1800, Rosalin Hawking, MD, 40 mg at 02/22/18 1736 .  bictegravir-emtricitabine-tenofovir AF (BIKTARVY) 50-200-25 MG per tablet 1 tablet, 1 tablet, Oral, Daily, Hatcher, Doroteo Bradford, MD .   butalbital-acetaminophen-caffeine (FIORICET, ESGIC) [id number] MG per tablet 1 tablet, 1 tablet, Oral, Q8H PRN, Rosalin Hawking, MD, 1 tablet at 02/20/18 [phone] .  insulin aspart (novoLOG) injection 0-9 Units, 0-9 Units, Subcutaneous, TID AC & HS, Rosalin Hawking, MD, 1 Units at 02/22/18 1240 .  senna-docusate (Senokot-S) tablet 1 tablet, 1 tablet, Oral, QHS PRN, Aroor, Karena Addison R, MD .  sertraline (ZOLOFT) tablet 25 mg, 25 mg, Oral, QHS, Rosalin Hawking, MD, 25 mg at 02/22/18 2205 .  topiramate (TOPAMAX) tablet 50 mg, 50 mg, Oral, QHS, Rosalin Hawking, MD, 50 mg at 02/22/18 2200  Patients Current Diet:  Diet Order            Diet heart healthy/carb modified Room service appropriate? Yes; Fluid consistency: Thin  Diet effective now              Precautions / Restrictions Precautions Precautions: Fall Restrictions Weight Bearing Restrictions: No   Has the patient had 2 or more falls or a fall with injury in the past year?No  Prior Activity Level Community (5-7x/wk): Prior to [place] patient was fully independent.  Lives at home with spouse and goddaughter.   Home Assistive Devices / Equipment Home Assistive Devices/Equipment: CBG Meter Home Equipment: None  Prior Device Use: Indicate devices/aids used by the patient prior to current illness, exacerbation or injury? None of the above  Prior Functional Level Prior Function Level of Independence: Independent  Self Care: Did the patient need help bathing, dressing, using the toilet or eating? Independent  Indoor Mobility: Did the patient need assistance with walking from room to room (with or without device)? Independent  Stairs: Did the patient need assistance with internal or external stairs (with or without device)? Independent  Functional Cognition: Did the patient need help planning regular tasks such as shopping or remembering to take medications? Independent  Current Functional Level Cognition  Overall Cognitive Status: Within  Functional Limits for tasks assessed Orientation Level: Oriented X4    Extremity Assessment (includes Sensation/Coordination)  Upper Extremity Assessment: LUE deficits/detail LUE Deficits / Details: some isolated movement shoulder all the way to hand with diminishing movement as get more distal, increased tone intermittently LUE Coordination: decreased fine motor, decreased gross motor  Lower Extremity Assessment: LLE deficits/detail LLE Deficits / Details: pt able to isolate movement with some difficulty.  grossly >3/5 LLE Coordination: decreased fine motor    ADLs  Overall ADL's : Needs assistance/impaired Eating/Feeding: Supervision/ safety, Set up Eating/Feeding Details (indicate cue type and reason): pt. c/o lip biting on L lower lip while chewing, also concerned with "drooping" appearance of face Grooming: Brushing hair, Standing, Minimal assistance, Cueing for compensatory techniques(applying perfume, donning wig, applying make up) Grooming Details (indicate cue type and reason): occasional LOB to left,  cues to correct, focus on B hand integration. pt. able to stabalize hair gel with L hand and apply pressure to squeeze it into palm of R hand. used both hands to hold make up sponge to apply make up Upper Body Bathing: Minimal assistance Upper Body Bathing Details (indicate cue type and reason): supported sitting Lower Body Bathing: Maximal assistance Lower Body Bathing Details (indicate cue type and reason): min A +2 sit<>stand Upper Body Dressing : Moderate assistance Upper Body Dressing Details (indicate cue type and reason): supported sitting Lower Body Dressing: Maximal assistance Lower Body Dressing Details (indicate cue type and reason): min A sit<>stand Toilet Transfer: Moderate assistance, +2 for safety/equipment, Ambulation, Comfort height toilet, Grab bars Toilet Transfer Details (indicate cue type and reason): Bil HHA Toileting- Clothing Manipulation and Hygiene:  Moderate assistance Toileting - Clothing Manipulation Details (indicate cue type and reason): min A +2 sit<>stand Functional mobility during ADLs: Min guard(furniture walks in room without device) General ADL Comments: min a for standing tasks, cues for use of both hands and compensatory techniques.  pt. eager and pleased with completing tasks on her own.      Mobility  Overal bed mobility: Needs Assistance Bed Mobility: Supine to Sit, Sit to Supine Supine to sit: Supervision Sit to supine: Supervision General bed mobility comments: supervision for safety, VCs for attention to things in the bed on her left side    Transfers  Overall transfer level: Needs assistance Equipment used: None Transfers: Sit to/from Stand, Stand Pivot Transfers Sit to Stand: Min guard Stand pivot transfers: Min guard General transfer comment: Min guard, initial posterior instability but no physical assist required    Ambulation / Gait / Stairs / Wheelchair Mobility  Ambulation/Gait Ambulation/Gait assistance: Counsellor (Feet): (Hallway ambulation for gait retraining) Assistive device: None Gait Pattern/deviations: Step-through pattern General Gait Details: gait retraining tasks performed with 4 bouts of 100'. Min guard for safety and stability, ocassional cues for environment left side Gait velocity: decreased Gait velocity interpretation: 1.31 - 2.62 ft/sec, indicative of limited community ambulator    Posture / Balance Balance Overall balance assessment: Needs assistance Sitting-balance support: No upper extremity supported, Feet supported Sitting balance-Leahy Scale: Good Standing balance support: No upper extremity supported Standing balance-Leahy Scale: Fair Standing balance comment: able to perform dyanmic tasks and take on challenge in standing High level balance activites: Side stepping, Direction changes, Turns High Level Balance Comments: Patient with some instability, difficulty  with start stop on command poor righting reaction time requiring ocassional hands on assist to maintain balance.     Special needs/care consideration BiPAP/CPAP: No CPM: No Continuous Drip IV: No Dialysis: No         Life Vest: No Oxygen: No Special Bed: No Trach Size: No Wound Vac (area): No       Skin: WDL                               Bowel mgmt: Continent, last BM 02/22/18  Bladder mgmt: Continent  Diabetic mgmt: Yes, oral medication and CBG checks 2x/day      Previous Home Environment Living Arrangements: Spouse/significant other Available Help at Discharge: Family, Available 24 hours/day Type of Home: House Home Layout: Two level Alternate Level Stairs-Rails: Left(going up) Alternate Level Stairs-Number of Steps: 10 Home Access: Level entry Bathroom Shower/Tub: Walk-in shower, Door [employer] Toilet: [place]: No  Discharge Living Setting Plans for Discharge Living Setting:  Patient's home, Lives with (comment)(Spouse and goddaughter) Type of Home at Discharge: House Discharge Home Layout: Two level Alternate Level Stairs-Rails: Left Alternate Level Stairs-Number of Steps: 10 Discharge Home Access: Level entry Discharge Bathroom Shower/Tub: Walk-in shower, Door Discharge Bathroom Toilet: Standard Discharge Bathroom Accessibility: Yes How Accessible: Accessible via walker Does the patient have any problems obtaining your medications?: No  Social/Family/Support Systems Patient Roles: Parent, Spouse Contact Information: see above  Anticipated Caregiver: Spouse & Friend Riki Sheer  Anticipated Ambulance person Information: see above  Ability/Limitations of Caregiver: None Caregiver Availability: 24/7 Discharge Plan Discussed with Primary Caregiver: No(Discussed with patient ) Is Caregiver In Agreement with Plan?: (Patient in agreement with plan; Mod I goals ) Does Caregiver/Family have Issues with Lodging/Transportation while Pt is in Rehab?:  No  Goals/Additional Needs Patient/Family Goal for Rehab: PT/OT/SLP: Mod I  Expected length of stay: 7-10 days  Cultural Considerations: None Dietary Needs: Carb. Mod. and Heart Healthy diet restrictions  Equipment Needs: TBD Pt/Family Agrees to Admission and willing to participate: Yes Program Orientation Provided & Reviewed with Pt/Caregiver Including Roles  & Responsibilities: Yes Additional Information Needs: Pending CD4 may need LP so Plavix held discussed with Linna Hoff and [place] Needs to be Provided By: Team FYI  Barriers to Discharge: Medical stability  Decrease burden of Care through IP rehab admission: No  Possible need for SNF placement upon discharge: No  Patient Condition: This patient's medical and functional status has changed since the consult dated: 02/19/18 in which the Rehabilitation Physician determined and documented that the patient's condition is appropriate for intensive rehabilitative care in an inpatient rehabilitation facility. See "History of Present Illness" (above) for medical update. Functional changes are: Min A-Min guard A with gait in hallway. Patient's medical and functional status update has been discussed with the Rehabilitation physician and patient remains appropriate for inpatient rehabilitation. Will admit to inpatient rehab today.  Preadmission Screen Completed By:  Gunnar Fusi, 02/23/2018 3:46 PM ______________________________________________________________________   Discussed status with Dr. Naaman Plummer on 02/23/18 at 1549 and received telephone approval for admission today.  Admission Coordinator:  Gunnar Fusi, time 1549/Date 02/23/18

## 2018-02-21 NOTE — Progress Notes (Signed)
Patient was told by Dr. Erlinda Hong that it was ok for her to shower and he would put an order in. I do not see a new or old order for this, patient ok with waiting until tomorrow as she has washed up some tonight.

## 2018-02-21 NOTE — Progress Notes (Signed)
Inpatient Rehabilitation  Note from MD that plans are for hopeful IP Rehab admission 02/22/18.  I have communicated with therapy team request for updates today.  I will plan to initiate insurance authorization with BCBS tomorrow morning and then follow up with patient.  Carmelia Roller., CCC/SLP Admission Coordinator  [place]  Cell [phone]

## 2018-02-21 NOTE — Progress Notes (Signed)
STROKE TEAM PROGRESS NOTE   SUBJECTIVE (INTERVAL HISTORY) Her husband is at the bedside.  No acute event overnight. Neuro stable.  PT OT recommend CIR. added Topamax nightly for headache prevention, patient denies headache today.  ID on board for new diagnosis of HIV.  OBJECTIVE Temp:  [98.1 F (36.7 C)-99.5 F (37.5 C)] 98.1 F (36.7 C) (08/11 0917) Pulse Rate:  [74-94] 79 (08/11 0917) Cardiac Rhythm: Normal sinus rhythm (08/11 0700) Resp:  [16-18] 18 (08/11 0917) BP: (99-136)/(54-81) 132/79 (08/11 0917) SpO2:  [95 %-98 %] 96 % (08/11 0917)  Recent Labs  Lab 02/20/18 1135 02/20/18 1650 02/20/18 2142 02/21/18 0616 02/21/18 1138  GLUCAP 99 74 91 83 87   Recent Labs  Lab 02/18/18 0002 02/19/18 0301 02/20/18 0521 02/21/18 0506  NA 136 138 136 137  K 3.2* 4.0 3.6 3.3*  CL 102 106 104 105  CO2 26 23 18* 22  GLUCOSE 226* 133* 115* 86  BUN 14 10 14 16   CREATININE 0.76 0.81 1.03* 0.94  CALCIUM 8.7* 9.1 9.5 9.3   Recent Labs  Lab 02/18/18 0002  AST 44*  ALT 30  ALKPHOS 105  BILITOT 0.6  PROT 7.7  ALBUMIN 3.9   Recent Labs  Lab 02/18/18 0002 02/19/18 0301 02/20/18 0521 02/21/18 0506  WBC 5.2 5.0 6.2 4.4  NEUTROABS 2.4  --   --   --   HGB 12.0 12.3 12.6 12.0  HCT 35.6 37.6 39.1 37.2  MCV 85.6 86.6 87.3 86.7  PLT 209 201 213 202   Recent Labs  Lab 02/18/18 0002  TROPONINI <0.03   No results for input(s): LABPROT, INR in the last 72 hours. No results for input(s): COLORURINE, LABSPEC, [place], GLUCOSEU, HGBUR, BILIRUBINUR, KETONESUR, PROTEINUR, UROBILINOGEN, NITRITE, LEUKOCYTESUR in the last 72 hours.  Invalid input(s): APPERANCEUR     Component Value Date/Time   CHOL 243 (H) 02/19/2018 0301   CHOL 211 (H) 06/22/2014 1432   TRIG 349 (H) 02/19/2018 0301   TRIG 473 (H) 06/22/2014 1432   HDL 37 (L) 02/19/2018 0301   HDL 42 06/22/2014 1432   CHOLHDL 6.6 02/19/2018 0301   VLDL 70 (H) 02/19/2018 0301   VLDL SEE COMMENT 06/22/2014 1432   LDLCALC 136 (H)  02/19/2018 0301   LDLCALC SEE COMMENT 06/22/2014 1432   Lab Results  Component Value Date   HGBA1C 9.1 (H) 02/19/2018      Component Value Date/Time   LABOPIA NONE DETECTED 02/18/2018 0003   COCAINSCRNUR NONE DETECTED 02/18/2018 0003   LABBENZ TEST NOT PERFORMED, REAGENT NOT AVAILABLE (A) 02/18/2018 0003   AMPHETMU NONE DETECTED 02/18/2018 0003   THCU NONE DETECTED 02/18/2018 0003   LABBARB NONE DETECTED 02/18/2018 0003    Recent Labs  Lab 02/18/18 0002  ETH <10   IMAGING  I have personally reviewed the radiological images below and agree with the radiology interpretations.  Ct Angio Head W Or Wo Contrast Ct Angio Neck W Or Wo Contrast 02/18/2018 IMPRESSION:  No emergent large vessel occlusion. Normal CTA of the head and neck    Mr Brain Wo Contrast 02/18/2018 IMPRESSION:  1. Acute small vessel infarct of the right basal ganglia, in close proximity to the right internal capsule, in keeping with reported left-sided weakness.  2. No hemorrhage or mass effect.    Ct Head Code Stroke Wo Contrast 02/18/2018 IMPRESSION:  1. No hemorrhage or mass effect.  2. ASPECTS is 10.    TTE - Left ventricle: The cavity size was normal. Wall  thickness was   increased in a pattern of mild LVH. Systolic function was normal.   The estimated ejection fraction was in the range of 55% to 60%.   Wall motion was normal; there were no regional wall motion   abnormalities. Doppler parameters are consistent with abnormal   left ventricular relaxation (grade 1 diastolic dysfunction). Impressions: - No cardiac source of emboli was indentified.   PHYSICAL EXAM  Temp:  [98.1 F (36.7 C)-99.5 F (37.5 C)] 98.1 F (36.7 C) (08/11 0917) Pulse Rate:  [74-94] 79 (08/11 0917) Resp:  [16-18] 18 (08/11 0917) BP: (99-136)/(54-81) 132/79 (08/11 0917) SpO2:  [95 %-98 %] 96 % (08/11 0917)  General - Well nourished, well developed, in no apparent distress.  Ophthalmologic - fundi not visualized due  to noncooperation.  Cardiovascular - Regular rate and rhythm.  Mental Status -  Level of arousal and orientation to time, place, and person were intact. Language including expression, naming, repetition, comprehension was assessed and found intact. Fund of Knowledge was assessed and was intact.  Cranial Nerves II - XII - II - Visual field intact OU. III, IV, VI - Extraocular movements intact. V - Facial sensation intact bilaterally. VII - mild left facial droop. VIII - Hearing & vestibular intact bilaterally. X - Palate elevates symmetrically, mild dysarthria. XI - Chin turning & shoulder shrug intact bilaterally. XII - Tongue protrusion intact.  Motor Strength - The patient's strength was normal in RUE and RLE, however, LUE 4+/5 proximal and 3/5 distal and LLE 5-/5 and pronator drift was absent.  Bulk was normal and fasciculations were absent.   Motor Tone - Muscle tone was assessed at the neck and appendages and was normal.  Reflexes - The patient's reflexes were symmetrical in all extremities and she had no pathological reflexes.  Sensory - Light touch, temperature/pinprick were assessed and were symmetrical.    Coordination - The patient had normal movements in the right hand with no ataxia or dysmetria.  Tremor was absent.  Gait and Station - deferred.   ASSESSMENT/PLAN [name] is a 62 y.o. female with history of DM, HLD, ulcerative colitis admitted for left facial droop and left hemiparesis. tPA given.   Stroke:  right small BG infarct likely secondary to small vessel disease source  Resultant left facial droop and left hemiparesis  MRI right small BG infarct  CTA head and neck unremarkable  2D Echo EF 55-60%  LDL 136  HgbA1c 9.1  UDS negative  SCDs for VTE prophylaxis  Diet NPO for now  No antithrombotic prior to admission, now on ASA 81 and plavix 75. Continue DAPT for 3 weeks and then ASA alone.   Patient counseled to be compliant with  her antithrombotic medications  Ongoing aggressive stroke risk factor management  Therapy recommendations:  CIR  Disposition:  Pending CIR  Diabetes  HgbA1c 9.1 goal < 7.0  Uncontrolled  Hyperglycemia  Resume home glipizide  CBG monitoring  SSI  DM education and close PCP follow up  Hypertension Stable, normal range  Long term BP goal normotensive  Hyperlipidemia  Home meds:  none   LDL 136, goal < 70  On lipitor 40  Continue statin at discharge  Migraine   Hx of migraine   HA for the last 2 days  On fioricet and tylenol PRN  On topamax 64m Qhs, will titrate to 558mbid in 5 days (02/25/18).   Consider to follow up with Dr. AhJaynee Eagless outpt  HIV positive  New diagnosis  HIV positive on serum testing  ID consulted  Viral load 5.0 and CD4 pending  HBV and HCV negative  Serum crypto Ag - negative - will discuss with ID to see if LP still needed  RPR pending  Other Stroke Risk Factors  High TG - on lipitor  Other Active Problems  Ulcerative colitis  Hypokalemia - K 3.2 - supplement -> 4.0->3.6->3.3 - supplement (check mg level with next labs)  Elevated creatinine 1.03  PLAN  Potential CIR admission Monday.  Hospital day # 3  Rosalin Hawking, MD PhD Stroke Neurology 02/21/2018 3:28 PM   To contact Stroke Continuity provider, please refer to [email or web address] After hours, contact General Neurology

## 2018-02-21 NOTE — Progress Notes (Signed)
Occupational Therapy Treatment Patient Details Name: [name] MRN: [id number] DOB: [birth year] Today's Date: 02/21/2018    History of present illness [name] is an 61 y.o. female with past medical history of diabetes mellitus, hyperlipidemia, ulcerative colitis, asthma presents to the emergency department at [facility] as a code stroke for sudden onset slurred speech and left-sided weakness.   OT comments  Pt. Continues to make gains with skilled OT.  Reports she has been actively working on fine motor exercises.  Standing grooming tasks this session with focus on incorporating use of B hands with compensatory strategies prn during ADL completion Min A.  Pt. Is highly motivated to return to prior level of function.  Remains excellent CIR candidate and would benefit from intense higher level therapies for continued progression with ADLS, L hand strengthening, and balance and safety during functional mobility.  Pt. Also noted some issues with lip biting during mastication on L lower lip.    Follow Up Recommendations  CIR;Supervision/Assistance - 24 hour    Equipment Recommendations       Recommendations for Other Services Rehab consult    Precautions / Restrictions Precautions Precautions: Fall       Mobility Bed Mobility               General bed mobility comments: seated eob upon arrival and opted to sit eob at end of session to eat lunch  Transfers Overall transfer level: Needs assistance Equipment used: 1 person hand held assist Transfers: Sit to/from [facility] Sit to Stand: Min assist Stand pivot transfers: Min guard            Balance Overall balance assessment: Needs assistance           Standing balance-Leahy Scale: Fair Standing balance comment: able to stand statically without assist, however pt. and sister reporrt she "got going too fast and was falling to the left and unable to correct so she fell  forward onto the bed with sister assisting her to bed to prevent fall".  educated on need to SLOW down and have someone with her during ambulation.  also reviewed benefit of "checking in" during functional mobility and standing tasks to see if she is leaning so she can correct before LOB or fall                           ADL either performed or assessed with clinical judgement   ADL Overall ADL's : Needs assistance/impaired Eating/Feeding: Supervision/ safety;Set up Eating/Feeding Details (indicate cue type and reason): pt. c/o lip biting on L lower lip while chewing, also concerned with "drooping" appearance of face Grooming: Brushing hair;Standing;Minimal assistance;Cueing for compensatory techniques(applying perfume, donning wig, applying make up) Grooming Details (indicate cue type and reason): occasional LOB to left,  cues to correct, focus on B hand integration. pt. able to stabalize hair gel with L hand and apply pressure to squeeze it into palm of R hand. used both hands to hold make up sponge to apply make up                             Functional mobility during ADLs: Min guard(furniture walks in room without device) General ADL Comments: min a for standing tasks, cues for use of both hands and compensatory techniques.  pt. eager and pleased with completing tasks on her own.       Vision  Perception     Praxis      Cognition Arousal/Alertness: Awake/alert Behavior During Therapy: WFL for tasks assessed/performed Overall Cognitive Status: Within Functional Limits for tasks assessed                                          Exercises     Shoulder Instructions       General Comments  hobbies include: singing, wig making, some cooking and also her dog had a recent litter of puppies her and her husband are caring for.     Pertinent Vitals/ Pain       Pain Assessment: No/denies pain  Home Living                                           Prior Functioning/Environment Level of Independence: Independent            Frequency  Min 3X/week        Progress Toward Goals  OT Goals(current goals can now be found in the care plan section)  Progress towards OT goals: Progressing toward goals     Plan      Co-evaluation                 AM-PAC PT "6 Clicks" Daily Activity     Outcome Measure   Help from another person eating meals?: A Little Help from another person taking care of personal grooming?: A Little Help from another person toileting, which includes using toliet, bedpan, or urinal?: A Lot Help from another person bathing (including washing, rinsing, drying)?: A Lot Help from another person to put on and taking off regular upper body clothing?: A Lot Help from another person to put on and taking off regular lower body clothing?: A Lot 6 Click Score: 14    End of Session Equipment Utilized During Treatment: Gait belt  OT Visit Diagnosis: Unsteadiness on feet (R26.81);Other abnormalities of gait and mobility (R26.89);Muscle weakness (generalized) (M62.81);Pain;Hemiplegia and hemiparesis Hemiplegia - Right/Left: Left Hemiplegia - dominant/non-dominant: Non-Dominant Hemiplegia - caused by: Cerebral infarction Pain - Right/Left: Left Pain - part of body: Arm   Activity Tolerance Patient tolerated treatment well   Patient Left with family/visitor present(seated eob)   Nurse Communication          Time: 1438-8875 OT Time Calculation (min): 22 min  Charges: OT General Charges $OT Visit: 1 Visit OT Treatments $Self Care/Home Management : 8-22 mins   Janice Coffin, COTA/L 02/21/2018, 11:57 AM

## 2018-02-21 NOTE — Progress Notes (Signed)
Physical Therapy Treatment Patient Details Name: [name] MRN: [id number] DOB: 04-Nov-1955 Today's Date: 02/21/2018    History of Present Illness [name] is an 62 y.o. female with past medical history of diabetes mellitus, hyperlipidemia, ulcerative colitis, asthma presents to the emergency department at [facility] as a code stroke for sudden onset slurred speech and left-sided weakness.    PT Comments    Patient progressing with activity. Very eager to improve. Tolerated increased ambulation but continues to show deficits in balance, left side attention (running into objects in hall) and poor overall righting strategies during LOB. Continue to feel that patient would benefit from continued rehabilitation. Patient eager for CIR progression, feel patient will do very well in this setting given her motivation to return to baseline independence. Will continue to see and progress as tolerated.   Follow Up Recommendations  CIR     Equipment Recommendations  None recommended by PT    Recommendations for Other Services Rehab consult     Precautions / Restrictions Precautions Precautions: Fall Restrictions Weight Bearing Restrictions: No    Mobility  Bed Mobility Overal bed mobility: Needs Assistance Bed Mobility: Supine to Sit;Sit to Supine     Supine to sit: Min guard Sit to supine: Min assist   General bed mobility comments: Min assist to elevate LLE back to bed, increased time and effort noted  Transfers Overall transfer level: Needs assistance Equipment used: None Transfers: Sit to/from [facility] Sit to Stand: Min guard Stand pivot transfers: Min guard       General transfer comment: Min guard for safety and stability  Ambulation/Gait Ambulation/Gait assistance: Min guard;Min assist Gait Distance (Feet): (Hallway ambulation for gait retraining) Assistive device: None Gait Pattern/deviations: Step-through  pattern Gait velocity: decreased Gait velocity interpretation: <1.8 ft/sec, indicate of risk for recurrent falls General Gait Details: Some noted LLE lag with activity, increased with fatigue, 2 episodes of min assist for stability with poor    Stairs             Wheelchair Mobility    Modified Rankin (Stroke Patients Only) Modified Rankin (Stroke Patients Only) Pre-Morbid Rankin Score: No symptoms Modified Rankin: Moderately severe disability     Balance Overall balance assessment: Needs assistance Sitting-balance support: No upper extremity supported;Feet supported Sitting balance-Leahy Scale: Fair     Standing balance support: Bilateral upper extremity supported Standing balance-Leahy Scale: Fair Standing balance comment: able to stand statically without assist, however pt. and sister reporrt she "got going too fast and was falling to the left and unable to correct so she fell forward onto the bed with sister assisting her to bed to prevent fall".  educated on need to SLOW down and have someone with her during ambulation.  also reviewed benefit of "checking in" during functional mobility and standing tasks to see if she is leaning so she can correct before LOB or fall             High level balance activites: Side stepping;Direction changes;Turns High Level Balance Comments: Patient with some instability, difficulty with start stop on command poor righting reaction time requiring ocassional hands on assist to maintain balance.             Cognition Arousal/Alertness: Awake/alert Behavior During Therapy: WFL for tasks assessed/performed Overall Cognitive Status: Within Functional Limits for tasks assessed  Exercises Other Exercises Other Exercises: dynamic perturbation with gait 20 ft increments x3 Other Exercises: start/stop balance activities (left reaction deficits noted)    General Comments         Pertinent Vitals/Pain Pain Assessment: Faces Faces Pain Scale: Hurts little more Pain Location: headache Pain Descriptors / Indicators: Aching Pain Intervention(s): Monitored during session    Home Living                      Prior Function Level of Independence: Independent          PT Goals (current goals can now be found in the care plan section) Acute Rehab PT Goals Patient Stated Goal: to rehab then home PT Goal Formulation: With patient Time For Goal Achievement: 03/04/18 Potential to Achieve Goals: Good Progress towards PT goals: Progressing toward goals    Frequency    Min 4X/week      PT Plan Current plan remains appropriate    Co-evaluation              AM-PAC PT "6 Clicks" Daily Activity  Outcome Measure  Difficulty turning over in bed (including adjusting bedclothes, sheets and blankets)?: A Little Difficulty moving from lying on back to sitting on the side of the bed? : A Little Difficulty sitting down on and standing up from a chair with arms (e.g., wheelchair, bedside commode, etc,.)?: A Little Help needed moving to and from a bed to chair (including a wheelchair)?: A Little Help needed walking in hospital room?: A Little Help needed climbing 3-5 steps with a railing? : A Little 6 Click Score: 18    End of Session Equipment Utilized During Treatment: Gait belt Activity Tolerance: Patient tolerated treatment well Patient left: in bed;with call bell/phone within reach Nurse Communication: Mobility status PT Visit Diagnosis: Unsteadiness on feet (R26.81);Hemiplegia and hemiparesis;Pain Hemiplegia - Right/Left: Left Hemiplegia - dominant/non-dominant: Non-dominant Hemiplegia - caused by: Cerebral infarction Pain - Right/Left: Left Pain - part of body: Arm     Time: 4497-5300 PT Time Calculation (min) (ACUTE ONLY): 18 min  Charges:  $Gait Training: 8-22 mins                     Alben Deeds, PT DPT  Board Certified  Neurologic Specialist [place] 02/21/2018, 2:02 PM

## 2018-02-21 NOTE — Plan of Care (Signed)
Not able to do LP at this time as pt is on plavix. Will discuss with ID if LP needed, will need to hold off plavix for 5 days and then do it in CIR before discharge.   Rosalin Hawking, MD PhD Stroke Neurology 02/21/2018 6:58 PM

## 2018-02-21 NOTE — Consult Note (Signed)
[facility] for Infectious Disease         Reason for Consult:newly diagnosed HIV disease    Referring Physician: XU  Active Problems:   Stroke (cerebrum) ([place])    HPI: [name] is a 62 y.o. female with hx of DM, asthma, admitted for slurred speech,left sided weakness, headache, and found to have small acute infarct of the right basal ganglia along themedial aspect of the globus pallidus. As part of her admission, she was tested for HIV disease- test +, with VL of 118,000. In our records, she was last tested for HIV 46yr ago, which was negative. She has not been tested since then.  She reports that she has been married 427yr though they were separated for 4 yrs from 2013-2017. She is not sexually active with her husband. While she was separated, she had one other partner-unknown of their HIV status. She states that her husband would not understand. She is upset by these results and concerned if people would know the medication she is on.  She denies any IVDU.  She states her headaches are improved. She states they are behind her eyes, left side. She has hx of migraines  Past Medical History:  Diagnosis Date  . Asthma   . [id number]. NIDDM-dx. 3 weeks ago  . Hyperlipidemia    hx  . Seasonal allergies   . [place]    Allergies:  Allergies  Allergen Reactions  . Azithromycin Itching    Muscle aches  . Erythromycin Nausea And Vomiting  . Penicillins Nausea And Vomiting    Has patient had a PCN reaction causing immediate rash, facial/tongue/throat swelling, SOB or lightheadedness with hypotension: Yes Has patient had a PCN reaction causing severe rash involving mucus membranes or skin necrosis: No Has patient had a PCN reaction that required hospitalization No Has patient had a PCN reaction occurring within the last 10 years: No If all of the above answers are "NO", then may proceed with Cephalosporin  use.   . Tetracycline Nausea And Vomiting     MEDICATIONS: . aspirin EC  81 mg Oral Daily  . atorvastatin  40 mg Oral q1800  . clopidogrel  75 mg Oral Daily  . glipiZIDE  10 mg Oral BID AC  . insulin aspart  0-9 Units Subcutaneous TID AC & HS  . sertraline  25 mg Oral QHS  . topiramate  50 mg Oral QHS    Social History   Tobacco Use  . Smoking status: Never Smoker  . Smokeless tobacco: Never Used  Substance Use Topics  . Alcohol use: Yes    Alcohol/week: 0.0 standard drinks    Comment: occasional  . Drug use: No    Family History  Problem Relation Age of Onset  . Hypertension Mother   . Diabetes Mother   . Cancer Father        lung ca  . Seizures Daughter        6y14yr   Review of Systems  Constitutional: Negative for fever, chills, diaphoresis, activity change, appetite change, fatigue and unexpected weight change.  HENT: Negative for congestion, sore throat, rhinorrhea, sneezing, trouble swallowing and sinus pressure.  Eyes: Negative for photophobia and visual disturbance.  Respiratory: Negative for cough, chest tightness, shortness of breath, wheezing and stridor.  Cardiovascular: Negative for chest pain, palpitations and leg swelling.  Gastrointestinal: Negative for nausea, vomiting, abdominal pain, diarrhea, constipation, blood in stool,  abdominal distention and anal bleeding.  Genitourinary: Negative for dysuria, hematuria, flank pain and difficulty urinating.  Musculoskeletal: Negative for myalgias, back pain, joint swelling, arthralgias and gait problem.  Skin: Negative for color change, pallor, rash and wound.  Neurological: +right sided headaches, left arm weakness, slurred speech. Negative for dizziness, tremors, weakness and light-headedness.  Hematological: Negative for adenopathy. Does not bruise/bleed easily.  Psychiatric/Behavioral: Negative for behavioral problems, confusion, sleep disturbance, dysphoric mood, decreased concentration and agitation.       OBJECTIVE: Temp:  [98.1 F (36.7 C)-99.5 F (37.5 C)] 98.1 F (36.7 C) (08/11 0917) Pulse Rate:  [74-94] 79 (08/11 0917) Resp:  [16-18] 18 (08/11 0917) BP: (99-136)/(54-85) 132/79 (08/11 0917) SpO2:  [95 %-98 %] 96 % (08/11 0917) Physical Exam  Constitutional:  oriented to person, place, and time. appears well-developed and well-nourished. No distress.  HENT: [place]/AT, PERRLA, no scleral icterus Mouth/Throat: Oropharynx is clear and moist. No oropharyngeal exudate.  Cardiovascular: Normal rate, regular rhythm and normal heart sounds. Exam reveals no gallop and no friction rub.  No murmur heard.  Pulmonary/Chest: Effort normal and breath sounds normal. No respiratory distress.  has no wheezes.  Neck = supple, no nuchal rigidity Abdominal: Soft. Bowel sounds are normal.  exhibits no distension. There is no tenderness.  Lymphadenopathy: no cervical adenopathy. No axillary adenopathy Neurological: alert and oriented to person, place, and time.facial asymmetry. Left grip strength weakness Skin: Skin is warm and dry. No rash noted. No erythema.  Psychiatric: a normal mood and affect.  behavior is normal.    LABS: Results for orders placed or performed during the hospital encounter of 02/18/18 (from the past 48 hour(s))  Glucose, capillary     Status: Abnormal   Collection Time: 02/19/18 11:47 AM  Result Value Ref Range   Glucose-Capillary 156 (H) 70 - 99 mg/dL  Glucose, capillary     Status: Abnormal   Collection Time: 02/19/18  4:30 PM  Result Value Ref Range   Glucose-Capillary 132 (H) 70 - 99 mg/dL   Comment 1 Notify RN    Comment 2 Document in Chart   HIV 1 RNA quant-no reflex-bld     Status: None   Collection Time: 02/19/18  7:33 PM  Result Value Ref Range   HIV 1 RNA Quant 118,000 copies/mL    Comment: (NOTE) The reportable range for this assay is 20 to 10,000,000 copies HIV-1 RNA/mL.    LOG10 HIV-1 RNA 5.072 log10copy/mL    Comment: (NOTE) Performed At: [facility] [place], [place] [id number] Rush Farmer MD [id number]   Hepatitis C antibody     Status: None   Collection Time: 02/19/18  7:33 PM  Result Value Ref Range   HCV Ab 0.2 0.0 - 0.9 s/co ratio    Comment: (NOTE)                                  Negative:     < 0.8                             Indeterminate: 0.8 - 0.9                                  Positive:     > 0.9 The CDC recommends that a positive HCV antibody  result be followed up with a HCV Nucleic Acid Amplification test (875797). Performed At: [facility] [place], [place] [id number] Rush Farmer MD [id number]   Hepatitis B surface antibody,qualitative     Status: None   Collection Time: 02/19/18  7:33 PM  Result Value Ref Range   Hep B S Ab Non Reactive     Comment: (NOTE)              Non Reactive: Inconsistent with immunity,                            less than 10 mIU/mL              Reactive:     Consistent with immunity,                            greater than 9.9 mIU/mL Performed At: [facility] [place], [place] [id number] Rush Farmer MD [id number]   Hepatitis B surface antigen     Status: None   Collection Time: 02/19/18  7:33 PM  Result Value Ref Range   Hepatitis B Surface Ag Negative Negative    Comment: (NOTE) Performed At: [facility] [place], [place] [id number] Rush Farmer MD [id number]   Glucose, capillary     Status: Abnormal   Collection Time: 02/19/18  9:18 PM  Result Value Ref Range   Glucose-Capillary 115 (H) 70 - 99 mg/dL   Comment 1 Notify RN    Comment 2 Document in Chart   CBC     Status: None   Collection Time: 02/20/18  5:21 AM  Result Value Ref Range   WBC 6.2 4.0 - 10.5 K/uL   RBC 4.48 3.87 - 5.11 MIL/uL   Hemoglobin 12.6 12.0 - 15.0 g/dL   HCT 39.1 36.0 - 46.0 %   MCV 87.3 78.0 - 100.0 fL   MCH 28.1 26.0 - 34.0 pg   MCHC 32.2 30.0 - 36.0 g/dL   RDW 13.2 11.5 -  15.5 %   Platelets 213 150 - 400 K/uL    Comment: Performed at [facility], [place] [address], [place], [place] [address]  Basic metabolic panel     Status: Abnormal   Collection Time: 02/20/18  5:21 AM  Result Value Ref Range   Sodium 136 135 - 145 mmol/L   Potassium 3.6 3.5 - 5.1 mmol/L   Chloride 104 98 - 111 mmol/L   CO2 18 (L) 22 - 32 mmol/L   Glucose, Bld 115 (H) 70 - 99 mg/dL   BUN 14 8 - 23 mg/dL   Creatinine, Ser 1.03 (H) 0.44 - 1.00 mg/dL   Calcium 9.5 8.9 - 10.3 mg/dL   GFR calc non Af Amer 57 (L) >60 mL/min   GFR calc Af Amer >60 >60 mL/min    Comment: (NOTE) The eGFR has been calculated using the CKD EPI equation. This calculation has not been validated in all clinical situations. eGFR's persistently <60 mL/min signify possible Chronic Kidney Disease.    Anion gap 14 5 - 15    Comment: Performed at [facility] [address] [place], [place] [address]  Glucose, capillary     Status: Abnormal   Collection Time: 02/20/18  6:59 AM  Result Value Ref Range   Glucose-Capillary 108 (H) 70 - 99 mg/dL  Glucose, capillary  Status: None   Collection Time: 02/20/18 11:35 AM  Result Value Ref Range   Glucose-Capillary 99 70 - 99 mg/dL  Glucose, capillary     Status: None   Collection Time: 02/20/18  4:50 PM  Result Value Ref Range   Glucose-Capillary 74 70 - 99 mg/dL   Comment 1 Notify RN    Comment 2 Document in Chart   Glucose, capillary     Status: None   Collection Time: 02/20/18  9:42 PM  Result Value Ref Range   Glucose-Capillary 91 70 - 99 mg/dL   Comment 1 Notify RN    Comment 2 Document in Chart   CBC     Status: None   Collection Time: 02/21/18  5:06 AM  Result Value Ref Range   WBC 4.4 4.0 - 10.5 K/uL   RBC 4.29 3.87 - 5.11 MIL/uL   Hemoglobin 12.0 12.0 - 15.0 g/dL   HCT 37.2 36.0 - 46.0 %   MCV 86.7 78.0 - 100.0 fL   MCH 28.0 26.0 - 34.0 pg   MCHC 32.3 30.0 - 36.0 g/dL   RDW 13.2 11.5 - 15.5 %   Platelets 202 150 - 400 K/uL     Comment: Performed at [facility], [place] [address], [place], [place] [address]  Basic metabolic panel     Status: Abnormal   Collection Time: 02/21/18  5:06 AM  Result Value Ref Range   Sodium 137 135 - 145 mmol/L   Potassium 3.3 (L) 3.5 - 5.1 mmol/L   Chloride 105 98 - 111 mmol/L   CO2 22 22 - 32 mmol/L   Glucose, Bld 86 70 - 99 mg/dL   BUN 16 8 - 23 mg/dL   Creatinine, Ser 0.94 0.44 - 1.00 mg/dL   Calcium 9.3 8.9 - 10.3 mg/dL   GFR calc non Af Amer >60 >60 mL/min   GFR calc Af Amer >60 >60 mL/min    Comment: (NOTE) The eGFR has been calculated using the CKD EPI equation. This calculation has not been validated in all clinical situations. eGFR's persistently <60 mL/min signify possible Chronic Kidney Disease.    Anion gap 10 5 - 15    Comment: Performed at [facility] [address] [place], [place] [address]  Glucose, capillary     Status: None   Collection Time: 02/21/18  6:16 AM  Result Value Ref Range   Glucose-Capillary 83 70 - 99 mg/dL    MICRO: Negative other than HIV + ,vL 118,000 IMAGING: No results found.  HISTORICAL MICRO/IMAGING  Assessment/Plan:  Newly diagnosed hiv disease, potentially exposed between 2013-2017. Her total lymphocyte count is 2.2K- with estimated CD 4 count in the 200-400s. Actually CD 4 count is pending. - I have disclosed her testing results and let her know that we will shortly be starting her HIV treatment. I have told there that we will not disclose her HIV status to anyone but her. - If her CD 4 count is below 200, would recommend to get LP to rule out cryptococcal meningitis - will check SCrAg - will check baseline labs for HIV disease.  Dr Johnnye Sima to see tomorrow

## 2018-02-22 DIAGNOSIS — Z21 Asymptomatic human immunodeficiency virus [HIV] infection status: Secondary | ICD-10-CM | POA: Diagnosis present

## 2018-02-22 DIAGNOSIS — B20 Human immunodeficiency virus [HIV] disease: Secondary | ICD-10-CM | POA: Diagnosis present

## 2018-02-22 LAB — GLUCOSE, CAPILLARY
GLUCOSE-CAPILLARY: 94 mg/dL (ref 70–99)
Glucose-Capillary: 61 mg/dL — ABNORMAL LOW (ref 70–99)
Glucose-Capillary: 109 mg/dL — ABNORMAL HIGH (ref 70–99)
Glucose-Capillary: 128 mg/dL — ABNORMAL HIGH (ref 70–99)
Glucose-Capillary: 66 mg/dL — ABNORMAL LOW (ref 70–99)
Glucose-Capillary: 127 mg/dL — ABNORMAL HIGH (ref 70–99)

## 2018-02-22 LAB — HIV 1/2 AB DIFFERENTIATION
HIV 1 AB: POSITIVE
HIV 2 AB: NEGATIVE

## 2018-02-22 LAB — RPR: RPR: NONREACTIVE

## 2018-02-22 MED ORDER — GLIPIZIDE 5 MG PO TABS
5.0000 mg | ORAL_TABLET | Freq: Two times a day (BID) | ORAL | Status: DC
  Administered 2018-02-22 – 2018-02-23 (×2): 5 mg via ORAL
  Filled 2018-02-22 (×2): qty 1

## 2018-02-22 NOTE — Progress Notes (Signed)
Inpatient Diabetes Program Recommendations  AACE/ADA: New Consensus Statement on Inpatient Glycemic Control (2015)  Target Ranges:  Prepandial:   less than 140 mg/dL      Peak postprandial:   less than 180 mg/dL (1-2 hours)      Critically ill patients:  140 - 180 mg/dL   Lab Results  Component Value Date   GLUCAP 109 (H) 02/22/2018   HGBA1C 9.1 (H) 02/19/2018    Review of Glycemic Control Results for [name], [name] (MRN [id number]) as of 02/22/2018 10:17  Ref. Range 02/21/2018 11:38 02/21/2018 15:50 02/21/2018 21:25 02/22/2018 03:49 02/22/2018 04:34  Glucose-Capillary Latest Ref Range: 70 - 99 mg/dL 87 77 84 61 (L) 109 (H)   Diabetes history: DM 2 Outpatient Diabetes medications: Glipizide 5 mg BID Current orders for Inpatient glycemic control: Novolog sensitive correction tid + hs  Inpatient Diabetes Program Recommendations:    Noted hypoglycemia -D/C oral DM medication while in the hospital -Decrease hs correction to Novolog 0-5 units hs  Thank you, Bethena Roys E. Aveon Colquhoun, RN, MSN, CDE  Diabetes Coordinator Inpatient Glycemic Control Team Team Pager [phone] (8am-5pm) 02/22/2018 10:26 AM

## 2018-02-22 NOTE — Progress Notes (Signed)
Inpatient Rehabilitation  Met with patient and friend at bedside to discuss team's recommendation for IP Rehab.  Shared booklets, insurance verification letter, and answered questions.  Patient unsure if her [place] for life is primary so we are starting auth with [employer] as well.  Plan to follow for timing of insurance authorization.  Hopeful for later today and plan to update team when I know.  Call if questions.    Carmelia Roller., CCC/SLP Admission Coordinator  [place]  Cell [phone]

## 2018-02-22 NOTE — Care Management Note (Signed)
Case Management Note  Patient Details  Name: [name] MRN: [id number] Date of Birth: [birth year]  Subjective/Objective:     Pt admitted with CVA. She is from home.   PCP:  Dr Garnette Gunner Insurance: BCBS             Action/Plan: Recommendations are for CIR. Awaiting [employer] authorization. CM following for d/c disposition.   Expected Discharge Date:                  Expected Discharge Plan:  [place]  In-House Referral:     Discharge planning Services  CM Consult  Post Acute Care Choice:    Choice offered to:     DME Arranged:    DME Agency:     HH Arranged:    [place] Agency:     Status of Service:  In process, will continue to follow  If discussed at Long Length of Stay Meetings, dates discussed:    Additional Comments:  [name], RN 02/22/2018, 3:03 PM

## 2018-02-22 NOTE — Progress Notes (Signed)
   INFECTIOUS DISEASE PROGRESS NOTE  ID: [name] is a 62 y.o. female with  Active Problems:   Stroke (cerebrum) (HCC)  Subjective: No complaints.   Abtx:  Anti-infectives (From admission, onward)   None      Medications:  Scheduled: . aspirin EC  81 mg Oral Daily  . atorvastatin  40 mg Oral q1800  . glipiZIDE  5 mg Oral BID AC  . insulin aspart  0-9 Units Subcutaneous TID AC & HS  . potassium chloride  20 mEq Oral BID  . sertraline  25 mg Oral QHS  . topiramate  50 mg Oral QHS    Objective: Vital signs in last 24 hours: Temp:  [98 F (36.7 C)-98.8 F (37.1 C)] 98.3 F (36.8 C) (08/12 0731) Pulse Rate:  [75-86] 76 (08/12 0731) Resp:  [18] 18 (08/12 0731) BP: (106-137)/(72-83) 106/76 (08/12 0731) SpO2:  [91 %-96 %] 95 % (08/12 0731)   General appearance: alert, cooperative and no distress Resp: clear to auscultation bilaterally Cardio: regular rate and rhythm GI: normal findings: bowel sounds normal and soft, non-tender  Lab Results Recent Labs    02/20/18 0521 02/21/18 0506  WBC 6.2 4.4  HGB 12.6 12.0  HCT 39.1 37.2  NA 136 137  K 3.6 3.3*  CL 104 105  CO2 18* 22  BUN 14 16  CREATININE 1.03* 0.94   Liver Panel No results for input(s): PROT, ALBUMIN, AST, ALT, ALKPHOS, BILITOT, BILIDIR, IBILI in the last 72 hours. Sedimentation Rate No results for input(s): ESRSEDRATE in the last 72 hours. C-Reactive Protein No results for input(s): CRP in the last 72 hours.  Microbiology: Recent Results (from the past 240 hour(s))  MRSA PCR Screening     Status: None   Collection Time: 02/18/18  6:51 AM  Result Value Ref Range Status   MRSA by PCR NEGATIVE NEGATIVE Final    Comment:        The GeneXpert MRSA Assay (FDA approved for NASAL specimens only), is one component of a comprehensive MRSA colonization surveillance program. It is not intended to diagnose MRSA infection nor to guide or monitor treatment for MRSA infections. Performed at  [facility], [place] [address], [place], [place] [address]     Studies/Results: No results found.   Assessment/Plan: CVA Newly dx HIV+  Total days of antibiotics: none  Await her CD4 count Will start her on biktarvy in AM Did not further discuss with pt as she had visitor in room.   HIV 1 RNA Quant (copies/mL)  Date Value  02/19/2018 118,000             Bobby Rumpf MD, FACP Infectious Diseases (pager) [phone] [email or web address] 02/22/2018, 10:42 AM  LOS: 4 days

## 2018-02-22 NOTE — Progress Notes (Signed)
CRITICAL VALUE STICKER  CRITICAL VALUE: Results for [name], [name] (MRN [id number]) as of 02/22/2018 21:03  Ref. Range 02/22/2018 20:27  Glucose-Capillary Latest Ref Range: 70 - 99 mg/dL 66 (L)    RECEIVER (on-site recipient of call):  DATE & TIME NOTIFIED: 02/22/18 2030  MESSENGER (representative from lab):  MD NOTIFIED: No  TIME OF NOTIFICATION:  RESPONSE: Treated per protocol.

## 2018-02-22 NOTE — Progress Notes (Signed)
Occupational Therapy Treatment Patient Details Name: [name] MRN: [id number] DOB: Sep 20, 1955 Today's Date: 02/22/2018    History of present illness Bentley Haralson is an 62 y.o. female with past medical history of diabetes mellitus, hyperlipidemia, ulcerative colitis, asthma presents to the emergency department at [facility] as a code stroke for sudden onset slurred speech and left-sided weakness.   OT comments  Pt agreeable to OT intervention this session with continued focus on fine motor coordination of L UE. Pt continues to verbalize feeling of numbness in L hand. Pt engaged in finger isolation exercises with min A for proper technique. Pt having greatest difficulty with 4 digit isolation and required assistance. Pt encouraged to utilize L hand in every day tasks and she was able to demonstrate opening apps on touch screen phone with increased time and multiple attempts. Pt becoming tearful at end of session secondary to being frustrated. OT provided therapeutic use of self and returned to recliner at end of session with call bell and all needed items within reach.   Follow Up Recommendations  CIR;Supervision/Assistance - 24 hour    Equipment Recommendations  Other (comment)(defer to next venue of care)    Recommendations for Other Services Rehab consult    Precautions / Restrictions Precautions Precautions: Fall Restrictions Weight Bearing Restrictions: No          Balance Overall balance assessment: Needs assistance Sitting-balance support: No upper extremity supported;Feet supported Sitting balance-Leahy Scale: Fair          ADL either performed or assessed with clinical judgement     Cognition Arousal/Alertness: Awake/alert Behavior During Therapy: WFL for tasks assessed/performed Overall Cognitive Status: Within Functional Limits for tasks assessed                         Pertinent Vitals/ Pain       Pain Assessment: No/denies  pain Pain Score: 0-No pain         Frequency  Min 3X/week        Progress Toward Goals  OT Goals(current goals can now be found in the care plan section)  Progress towards OT goals: Progressing toward goals  Acute Rehab OT Goals Patient Stated Goal: to rehab then home OT Goal Formulation: With patient/family Time For Goal Achievement: 03/08/18 Potential to Achieve Goals: Good  Plan Discharge plan remains appropriate       AM-PAC PT "6 Clicks" Daily Activity     Outcome Measure   Help from another person eating meals?: A Little Help from another person taking care of personal grooming?: A Little Help from another person toileting, which includes using toliet, bedpan, or urinal?: A Lot Help from another person bathing (including washing, rinsing, drying)?: A Lot Help from another person to put on and taking off regular upper body clothing?: A Lot Help from another person to put on and taking off regular lower body clothing?: A Lot 6 Click Score: 14    End of Session    OT Visit Diagnosis: Unsteadiness on feet (R26.81);Other abnormalities of gait and mobility (R26.89);Muscle weakness (generalized) (M62.81);Pain;Hemiplegia and hemiparesis Hemiplegia - Right/Left: Left Hemiplegia - dominant/non-dominant: Non-Dominant Hemiplegia - caused by: Cerebral infarction Pain - Right/Left: Left Pain - part of body: Arm   Activity Tolerance Patient tolerated treatment well   Patient Left with family/visitor present           Time: 1355-1413 OT Time Calculation (min): 18 min  Charges: OT General Charges $OT Visit: 1  Visit OT Treatments $Therapeutic Activity: 8-22 mins    Gypsy Decant, MS, OTR/L 02/22/2018, 2:40 PM

## 2018-02-22 NOTE — Progress Notes (Signed)
Lab called RN with critical lab value of HIV 1 antibody positive. MD paged and notified. No new order received. Will continue to closely monitor. Delia Heady RN

## 2018-02-22 NOTE — Progress Notes (Signed)
STROKE TEAM PROGRESS NOTE   SUBJECTIVE (INTERVAL HISTORY) Her daughter is at the bedside. No acute event. Still pending insurance approval for CIR placement. ID on board, recommend LP if CD4 < 200.   OBJECTIVE Temp:  [98 F (36.7 C)-98.8 F (37.1 C)] 98.2 F (36.8 C) (08/12 1239) Pulse Rate:  [75-86] 81 (08/12 1239) Cardiac Rhythm: Normal sinus rhythm (08/12 0700) Resp:  [18] 18 (08/12 1239) BP: (106-137)/(72-83) 133/80 (08/12 1239) SpO2:  [91 %-97 %] 97 % (08/12 1239)  Recent Labs  Lab 02/21/18 1550 02/21/18 2125 02/22/18 0349 02/22/18 0434 02/22/18 1109  GLUCAP 77 84 61* 109* 128*   Recent Labs  Lab 02/18/18 0002 02/19/18 0301 02/20/18 0521 02/21/18 0506  NA 136 138 136 137  K 3.2* 4.0 3.6 3.3*  CL 102 106 104 105  CO2 26 23 18* 22  GLUCOSE 226* 133* 115* 86  BUN 14 10 14 16   CREATININE 0.76 0.81 1.03* 0.94  CALCIUM 8.7* 9.1 9.5 9.3   Recent Labs  Lab 02/18/18 0002  AST 44*  ALT 30  ALKPHOS 105  BILITOT 0.6  PROT 7.7  ALBUMIN 3.9   Recent Labs  Lab 02/18/18 0002 02/19/18 0301 02/20/18 0521 02/21/18 0506  WBC 5.2 5.0 6.2 4.4  NEUTROABS 2.4  --   --   --   HGB 12.0 12.3 12.6 12.0  HCT 35.6 37.6 39.1 37.2  MCV 85.6 86.6 87.3 86.7  PLT 209 201 213 202   Recent Labs  Lab 02/18/18 0002  TROPONINI <0.03   No results for input(s): LABPROT, INR in the last 72 hours. No results for input(s): COLORURINE, LABSPEC, [place], GLUCOSEU, HGBUR, BILIRUBINUR, KETONESUR, PROTEINUR, UROBILINOGEN, NITRITE, LEUKOCYTESUR in the last 72 hours.  Invalid input(s): APPERANCEUR     Component Value Date/Time   CHOL 243 (H) 02/19/2018 0301   CHOL 211 (H) 06/22/2014 1432   TRIG 349 (H) 02/19/2018 0301   TRIG 473 (H) 06/22/2014 1432   HDL 37 (L) 02/19/2018 0301   HDL 42 06/22/2014 1432   CHOLHDL 6.6 02/19/2018 0301   VLDL 70 (H) 02/19/2018 0301   VLDL SEE COMMENT 06/22/2014 1432   LDLCALC 136 (H) 02/19/2018 0301   LDLCALC SEE COMMENT 06/22/2014 1432   Lab  Results  Component Value Date   HGBA1C 9.1 (H) 02/19/2018      Component Value Date/Time   LABOPIA NONE DETECTED 02/18/2018 0003   COCAINSCRNUR NONE DETECTED 02/18/2018 0003   LABBENZ TEST NOT PERFORMED, REAGENT NOT AVAILABLE (A) 02/18/2018 0003   AMPHETMU NONE DETECTED 02/18/2018 0003   THCU NONE DETECTED 02/18/2018 0003   LABBARB NONE DETECTED 02/18/2018 0003    Recent Labs  Lab 02/18/18 0002  ETH <10   IMAGING  I have personally reviewed the radiological images below and agree with the radiology interpretations.  Ct Angio Head W Or Wo Contrast Ct Angio Neck W Or Wo Contrast 02/18/2018 IMPRESSION:  No emergent large vessel occlusion. Normal CTA of the head and neck    Mr Brain Wo Contrast 02/18/2018 IMPRESSION:  1. Acute small vessel infarct of the right basal ganglia, in close proximity to the right internal capsule, in keeping with reported left-sided weakness.  2. No hemorrhage or mass effect.    Ct Head Code Stroke Wo Contrast 02/18/2018 IMPRESSION:  1. No hemorrhage or mass effect.  2. ASPECTS is 10.    TTE - Left ventricle: The cavity size was normal. Wall thickness was   increased in a pattern of mild LVH.  Systolic function was normal.   The estimated ejection fraction was in the range of 55% to 60%.   Wall motion was normal; there were no regional wall motion   abnormalities. Doppler parameters are consistent with abnormal   left ventricular relaxation (grade 1 diastolic dysfunction). Impressions: - No cardiac source of emboli was indentified.   PHYSICAL EXAM  Temp:  [98 F (36.7 C)-98.8 F (37.1 C)] 98.2 F (36.8 C) (08/12 1239) Pulse Rate:  [75-86] 81 (08/12 1239) Resp:  [18] 18 (08/12 1239) BP: (106-137)/(72-83) 133/80 (08/12 1239) SpO2:  [91 %-97 %] 97 % (08/12 1239)  General - Well nourished, well developed, in no apparent distress.  Ophthalmologic - fundi not visualized due to noncooperation.  Cardiovascular - Regular rate and  rhythm.  Mental Status -  Level of arousal and orientation to time, place, and person were intact. Language including expression, naming, repetition, comprehension was assessed and found intact. Fund of Knowledge was assessed and was intact.  Cranial Nerves II - XII - II - Visual field intact OU. III, IV, VI - Extraocular movements intact. V - Facial sensation intact bilaterally. VII - mild left facial droop. VIII - Hearing & vestibular intact bilaterally. X - Palate elevates symmetrically, mild dysarthria. XI - Chin turning & shoulder shrug intact bilaterally. XII - Tongue protrusion intact.  Motor Strength - The patient's strength was normal in RUE and RLE, however, LUE 4+/5 proximal and 3/5 distal and LLE 5-/5 and pronator drift was absent.  Bulk was normal and fasciculations were absent.   Motor Tone - Muscle tone was assessed at the neck and appendages and was normal.  Reflexes - The patient's reflexes were symmetrical in all extremities and she had no pathological reflexes.  Sensory - Light touch, temperature/pinprick were assessed and were symmetrical.    Coordination - The patient had normal movements in the right hand with no ataxia or dysmetria.  Tremor was absent.  Gait and Station - deferred.   ASSESSMENT/PLAN [name] is a 62 y.o. female with history of DM, HLD, ulcerative colitis admitted for left facial droop and left hemiparesis. tPA given.   Stroke:  right small BG infarct likely secondary to small vessel disease source  Resultant left facial droop and left hemiparesis  MRI right small BG infarct  CTA head and neck unremarkable  2D Echo EF 55-60%  LDL 136  HgbA1c 9.1  UDS negative  SCDs for VTE prophylaxis  Diet NPO for now  No antithrombotic prior to admission, now on ASA 81. Plavix on hold now in case pt needs LP.   Patient counseled to be compliant with her antithrombotic medications  Ongoing aggressive stroke risk factor  management  Therapy recommendations:  CIR  Disposition:  Pending CIR  Diabetes  HgbA1c 9.1 goal < 7.0  Uncontrolled  Hyperglycemia but with one hypoglycemia episode last night - decrease home glipizide to 40m bid  CBG monitoring  SSI  DM education and close PCP follow up  Hypertension Stable, normal range  Long term BP goal normotensive  Hyperlipidemia  Home meds:  none   LDL 136, goal < 70  On lipitor 40  Continue statin at discharge  Migraine   Hx of migraine   HA for the last 2 days  On fioricet and tylenol PRN  On topamax 545mQhs, will titrate to 5026mid in 5 days (02/25/18).   Consider to follow up with Dr. AheJaynee Eagles outpt  HIV positive  New diagnosis - do  not mention it to other family members as per pt request  HIV positive on serum testing x 2  ID on board  Viral load 5.0 and CD4 pending  RPR, HBV and HCV negative  Serum crypto Ag - negative - will need LP if CD4 < 200  Other Stroke Risk Factors  High TG - on lipitor  Other Active Problems  Ulcerative colitis  Hypokalemia - K 3.2 - supplement -> 4.0->3.6->3.3 - supplement (check mg level with next labs)  Elevated creatinine 1.03->0.94  Hospital day # 4  Rosalin Hawking, MD PhD Stroke Neurology 02/22/2018 5:25 PM    To contact Stroke Continuity provider, please refer to [email or web address] After hours, contact General Neurology

## 2018-02-22 NOTE — Progress Notes (Signed)
Patients CBG was taken out of schedule and was 61 at 0349 gave patient 15 mg of carbohydrate at 0430 and CBG was 109. Will continue to monitor

## 2018-02-23 ENCOUNTER — Encounter (HOSPITAL_COMMUNITY): Payer: Self-pay

## 2018-02-23 ENCOUNTER — Other Ambulatory Visit: Payer: Self-pay

## 2018-02-23 ENCOUNTER — Inpatient Hospital Stay (HOSPITAL_COMMUNITY)
Admission: RE | Admit: 2018-02-23 | Discharge: 2018-03-02 | DRG: 057 | Disposition: A | Discharge status: Home / Self Care | Payer: BLUE CROSS/BLUE SHIELD | Source: Intra-hospital | Attending: Physical Medicine & Rehabilitation | Admitting: Physical Medicine & Rehabilitation

## 2018-02-23 DIAGNOSIS — E1151 Type 2 diabetes mellitus with diabetic peripheral angiopathy without gangrene: Secondary | ICD-10-CM | POA: Diagnosis present

## 2018-02-23 DIAGNOSIS — E1142 Type 2 diabetes mellitus with diabetic polyneuropathy: Secondary | ICD-10-CM | POA: Diagnosis not present

## 2018-02-23 DIAGNOSIS — B20 Human immunodeficiency virus [HIV] disease: Secondary | ICD-10-CM

## 2018-02-23 DIAGNOSIS — Z79899 Other long term (current) drug therapy: Secondary | ICD-10-CM

## 2018-02-23 DIAGNOSIS — F329 Major depressive disorder, single episode, unspecified: Secondary | ICD-10-CM | POA: Diagnosis not present

## 2018-02-23 DIAGNOSIS — I69354 Hemiplegia and hemiparesis following cerebral infarction affecting left non-dominant side: Secondary | ICD-10-CM | POA: Diagnosis not present

## 2018-02-23 DIAGNOSIS — K5900 Constipation, unspecified: Secondary | ICD-10-CM | POA: Diagnosis present

## 2018-02-23 DIAGNOSIS — Z833 Family history of diabetes mellitus: Secondary | ICD-10-CM

## 2018-02-23 DIAGNOSIS — G43909 Migraine, unspecified, not intractable, without status migrainosus: Secondary | ICD-10-CM | POA: Diagnosis present

## 2018-02-23 DIAGNOSIS — E876 Hypokalemia: Secondary | ICD-10-CM | POA: Diagnosis not present

## 2018-02-23 DIAGNOSIS — I639 Cerebral infarction, unspecified: Secondary | ICD-10-CM | POA: Diagnosis present

## 2018-02-23 DIAGNOSIS — Z7984 Long term (current) use of oral hypoglycemic drugs: Secondary | ICD-10-CM

## 2018-02-23 DIAGNOSIS — E785 Hyperlipidemia, unspecified: Secondary | ICD-10-CM | POA: Diagnosis not present

## 2018-02-23 DIAGNOSIS — I69328 Other speech and language deficits following cerebral infarction: Secondary | ICD-10-CM

## 2018-02-23 DIAGNOSIS — J45909 Unspecified asthma, uncomplicated: Secondary | ICD-10-CM | POA: Diagnosis not present

## 2018-02-23 DIAGNOSIS — R29810 Facial weakness: Secondary | ICD-10-CM

## 2018-02-23 DIAGNOSIS — E119 Type 2 diabetes mellitus without complications: Secondary | ICD-10-CM

## 2018-02-23 DIAGNOSIS — Z21 Asymptomatic human immunodeficiency virus [HIV] infection status: Secondary | ICD-10-CM | POA: Diagnosis present

## 2018-02-23 DIAGNOSIS — G8114 Spastic hemiplegia affecting left nondominant side: Secondary | ICD-10-CM | POA: Diagnosis not present

## 2018-02-23 DIAGNOSIS — G43009 Migraine without aura, not intractable, without status migrainosus: Secondary | ICD-10-CM

## 2018-02-23 LAB — GLUCOSE, CAPILLARY
Glucose-Capillary: 102 mg/dL — ABNORMAL HIGH (ref 70–99)
Glucose-Capillary: 76 mg/dL (ref 70–99)
Glucose-Capillary: 111 mg/dL — ABNORMAL HIGH (ref 70–99)
Glucose-Capillary: 116 mg/dL — ABNORMAL HIGH (ref 70–99)

## 2018-02-23 LAB — CBC WITH DIFFERENTIAL/PLATELET
ABS IMMATURE GRANULOCYTES: 0 10*3/uL (ref 0.0–0.1)
BASOS ABS: 0 10*3/uL (ref 0.0–0.1)
Basophils Relative: 0 %
EOS PCT: 1 %
Eosinophils Absolute: 0 10*3/uL (ref 0.0–0.7)
HEMATOCRIT: 37.3 % (ref 36.0–46.0)
HEMOGLOBIN: 12 g/dL (ref 12.0–15.0)
Immature Granulocytes: 0 %
LYMPHS ABS: 1.8 10*3/uL (ref 0.7–4.0)
LYMPHS PCT: 45 %
MCH: 28.2 pg (ref 26.0–34.0)
MCHC: 32.2 g/dL (ref 30.0–36.0)
MCV: 87.8 fL (ref 78.0–100.0)
MONO ABS: 0.7 10*3/uL (ref 0.1–1.0)
MONOS PCT: 17 %
NEUTROS ABS: 1.5 10*3/uL — AB (ref 1.7–7.7)
Neutrophils Relative %: 37 %
Platelets: 218 10*3/uL (ref 150–400)
RBC: 4.25 MIL/uL (ref 3.87–5.11)
RDW: 13.2 % (ref 11.5–15.5)
WBC: 4.1 10*3/uL (ref 4.0–10.5)

## 2018-02-23 LAB — COMPREHENSIVE METABOLIC PANEL
ALBUMIN: 3.5 g/dL (ref 3.5–5.0)
ALK PHOS: 135 U/L — AB (ref 38–126)
ALT: 99 U/L — ABNORMAL HIGH (ref 0–44)
AST: 75 U/L — ABNORMAL HIGH (ref 15–41)
Anion gap: 8 (ref 5–15)
BILIRUBIN TOTAL: 0.6 mg/dL (ref 0.3–1.2)
BUN: 17 mg/dL (ref 8–23)
CALCIUM: 9.5 mg/dL (ref 8.9–10.3)
CO2: 22 mmol/L (ref 22–32)
Chloride: 109 mmol/L (ref 98–111)
Creatinine, Ser: 0.95 mg/dL (ref 0.44–1.00)
GFR calc non Af Amer: >60 mL/min (ref >60)
Glucose, Bld: 123 mg/dL — ABNORMAL HIGH (ref 70–99)
Potassium: 3.5 mmol/L (ref 3.5–5.1)
SODIUM: 139 mmol/L (ref 135–145)
TOTAL PROTEIN: 7.5 g/dL (ref 6.5–8.1)

## 2018-02-23 LAB — BASIC METABOLIC PANEL
ANION GAP: 9 (ref 5–15)
BUN: 15 mg/dL (ref 8–23)
CALCIUM: 9.4 mg/dL (ref 8.9–10.3)
CO2: 22 mmol/L (ref 22–32)
Chloride: 108 mmol/L (ref 98–111)
Creatinine, Ser: 0.88 mg/dL (ref 0.44–1.00)
GFR calc Af Amer: >60 mL/min (ref >60)
GLUCOSE: 91 mg/dL (ref 70–99)
Potassium: 3.8 mmol/L (ref 3.5–5.1)
Sodium: 139 mmol/L (ref 135–145)

## 2018-02-23 LAB — RPR: RPR: NONREACTIVE

## 2018-02-23 LAB — T-HELPER CELLS (CD4) COUNT (NOT AT ARMC)
CD4 % Helper T Cell: 28 % — ABNORMAL LOW (ref 33–55)
CD4 T CELL ABS: 500 /uL (ref 400–2700)

## 2018-02-23 LAB — MAGNESIUM: MAGNESIUM: 2.1 mg/dL (ref 1.7–2.4)

## 2018-02-23 MED ORDER — SERTRALINE HCL 50 MG PO TABS
25.0000 mg | ORAL_TABLET | Freq: Every day | ORAL | Status: DC
  Administered 2018-02-23 – 2018-03-01 (×7): 25 mg via ORAL
  Filled 2018-02-23 (×7): qty 1

## 2018-02-23 MED ORDER — INSULIN ASPART 100 UNIT/ML ~~LOC~~ SOLN: 0.0000 [IU] | Freq: Three times a day (TID) | SUBCUTANEOUS | 11 refills | Status: DC

## 2018-02-23 MED ORDER — TOPIRAMATE 25 MG PO TABS
50.0000 mg | ORAL_TABLET | Freq: Every day | ORAL | Status: DC
  Administered 2018-02-23 – 2018-03-01 (×7): 50 mg via ORAL
  Filled 2018-02-23 (×7): qty 2

## 2018-02-23 MED ORDER — ASPIRIN EC 81 MG PO TBEC
81.0000 mg | DELAYED_RELEASE_TABLET | Freq: Every day | ORAL | Status: DC
  Administered 2018-02-24 – 2018-03-02 (×7): 81 mg via ORAL
  Filled 2018-02-23 (×8): qty 1

## 2018-02-23 MED ORDER — ALBUTEROL SULFATE (2.5 MG/3ML) 0.083% IN NEBU: 3.0000 mL | INHALATION_SOLUTION | Freq: Four times a day (QID) | RESPIRATORY_TRACT | Status: DC | PRN

## 2018-02-23 MED ORDER — TOPIRAMATE 50 MG PO TABS: 50.0000 mg | ORAL_TABLET | Freq: Every day | ORAL | Status: DC

## 2018-02-23 MED ORDER — ACETAMINOPHEN 325 MG PO TABS
650.0000 mg | ORAL_TABLET | Freq: Four times a day (QID) | ORAL | Status: DC | PRN
  Administered 2018-02-27: 650 mg via ORAL
  Filled 2018-02-23: qty 2

## 2018-02-23 MED ORDER — SENNOSIDES-DOCUSATE SODIUM 8.6-50 MG PO TABS
1.0000 | ORAL_TABLET | Freq: Every evening | ORAL | Status: DC | PRN
Start: 2018-02-23 — End: 2018-02-28

## 2018-02-23 MED ORDER — CLOPIDOGREL BISULFATE 75 MG PO TABS: 75.0000 mg | ORAL_TABLET | Freq: Every day | ORAL | 11 refills | Status: DC

## 2018-02-23 MED ORDER — ASPIRIN 81 MG PO TBEC: 81.0000 mg | DELAYED_RELEASE_TABLET | Freq: Every day | ORAL | Status: AC

## 2018-02-23 MED ORDER — INSULIN ASPART 100 UNIT/ML ~~LOC~~ SOLN
0.0000 [IU] | Freq: Three times a day (TID) | SUBCUTANEOUS | Status: DC
  Administered 2018-02-24 – 2018-02-27 (×7): 1 [IU] via SUBCUTANEOUS
  Administered 2018-02-28 (×2): 2 [IU] via SUBCUTANEOUS
  Administered 2018-03-01: 1 [IU] via SUBCUTANEOUS
  Administered 2018-03-01: 2 [IU] via SUBCUTANEOUS
  Administered 2018-03-01 – 2018-03-02 (×3): 1 [IU] via SUBCUTANEOUS

## 2018-02-23 MED ORDER — BICTEGRAVIR-EMTRICITAB-TENOFOV 50-200-25 MG PO TABS
1.0000 | ORAL_TABLET | Freq: Every day | ORAL | Status: DC
  Administered 2018-02-24 – 2018-03-02 (×7): 1 via ORAL
  Filled 2018-02-23 (×7): qty 1

## 2018-02-23 MED ORDER — BICTEGRAVIR-EMTRICITAB-TENOFOV 50-200-25 MG PO TABS
1.0000 | ORAL_TABLET | Freq: Every day | ORAL | Status: DC
  Administered 2018-02-23: 1 via ORAL
  Filled 2018-02-23: qty 1

## 2018-02-23 MED ORDER — ATORVASTATIN CALCIUM 40 MG PO TABS: 40.0000 mg | ORAL_TABLET | Freq: Every day | ORAL | Status: DC

## 2018-02-23 MED ORDER — ATORVASTATIN CALCIUM 40 MG PO TABS
40.0000 mg | ORAL_TABLET | Freq: Every day | ORAL | Status: DC
  Administered 2018-02-24 – 2018-03-01 (×6): 40 mg via ORAL
  Filled 2018-02-23 (×6): qty 1

## 2018-02-23 MED ORDER — BICTEGRAVIR-EMTRICITAB-TENOFOV 50-200-25 MG PO TABS: 1.0000 | ORAL_TABLET | Freq: Every day | ORAL | Status: DC

## 2018-02-23 NOTE — Progress Notes (Signed)
Inpatient Rehabilitation  I have received insurance authorization for IP Rehab admission, have medical clearance, and a bed to offer.  Reviewed insurance verification letter with patient and she is in agreement.  Plan to proceed with admission today.  Updated team.  Call if questions.  Carmelia Roller., CCC/SLP Admission Coordinator  [place]  Cell [phone]

## 2018-02-23 NOTE — Progress Notes (Signed)
Pt discharge education completed. Pt discharge to CIR and report called off to nurse Angie on Rehab. Pt IV and telemetry removed. Pt transported off unit via wheelchair with belongings and family to the side. Delia Heady RN

## 2018-02-23 NOTE — Progress Notes (Signed)
Pt admitted to rehab unit during change of shift with belongings; A+Ox4; oriented to unit, rehab schedule, fall prevention plan, and rehab safety plan; pt denies needs; will continue to monitor

## 2018-02-23 NOTE — Discharge Summary (Addendum)
Stroke Discharge Summary  Patient ID: [name]   MRN: [id number]      DOB: 1956-02-05  Date of Admission: 02/18/2018 Date of Discharge: 02/23/2018  Attending Physician:  Rosalin Hawking, MD, Stroke MD Consultant(s):  Carlyle Basques MD ( ID ), Alger Simons, MD (Physical Medicine & Rehabtilitation)  Patient's PCP:  Jearld Fenton, NP  Discharge Diagnoses:  Principal Problem:   Stroke (cerebrum) ([place]) - R basal ganglia d/t small vessel dz Active Problems:   Ulcerative colitis ([place])   Type 2 diabetes mellitus ([place])   HLD (hyperlipidemia)   HIV disease ([place])   Essential hypertension   Migraine  Past Medical History:  Diagnosis Date  . Asthma   . Diabetes mellitus without complication ([place]) 01-09-46   hx. NIDDM-dx. 3 weeks ago  . Hyperlipidemia    hx  . Seasonal allergies   . Ulcerative colitis [facility])    Past Surgical History:  Procedure Laterality Date  . BUNIONECTOMY     bilateral and toe nails of big toes removed  . CESAREAN SECTION  1997/2006  . CHOLECYSTECTOMY N/A 12/01/2012   Procedure: LAPAROSCOPIC CHOLECYSTECTOMY;  Surgeon: Stark Klein, MD;  Location: [facility];  Service: General;  Laterality: N/A;  . COLONOSCOPY W/ POLYPECTOMY    . EYE SURGERY Bilateral 2013  . TONSILLECTOMY      Medications to be continued on Rehab Allergies as of 02/23/2018      Reactions   Azithromycin Itching   Muscle aches   Erythromycin Nausea And Vomiting   Penicillins Nausea And Vomiting   Has patient had a PCN reaction causing immediate rash, facial/tongue/throat swelling, SOB or lightheadedness with hypotension: Yes Has patient had a PCN reaction causing severe rash involving mucus membranes or skin necrosis: No Has patient had a PCN reaction that required hospitalization No Has patient had a PCN reaction occurring within the last 10 years: No If all of the above answers are "NO", then may proceed with Cephalosporin use.   Tetracycline Nausea And Vomiting      Medication List     STOP taking these medications   glipiZIDE 10 MG tablet Commonly known as:  GLUCOTROL   ondansetron 4 MG tablet Commonly known as:  ZOFRAN   sertraline 25 MG tablet Commonly known as:  ZOLOFT     TAKE these medications   albuterol 108 (90 Base) MCG/ACT inhaler Commonly known as:  PROVENTIL HFA;VENTOLIN HFA Inhale 2 puffs into the lungs every 6 (six) hours as needed for wheezing or shortness of breath.   aspirin 81 MG EC tablet Take 1 tablet (81 mg total) by mouth daily. Start taking on:  02/24/2018   atorvastatin 40 MG tablet Commonly known as:  LIPITOR Take 1 tablet (40 mg total) by mouth daily at 6 PM.   bictegravir-emtricitabine-tenofovir AF 50-200-25 MG Tabs tablet Commonly known as:  BIKTARVY Take 1 tablet by mouth daily.   clopidogrel 75 MG tablet Commonly known as:  PLAVIX Take 1 tablet (75 mg total) by mouth daily.   insulin aspart 100 UNIT/ML injection Commonly known as:  novoLOG Inject 0-9 Units into the skin 4 (four) times daily -  before meals and at bedtime.   topiramate 50 MG tablet Commonly known as:  TOPAMAX Take 1 tablet (50 mg total) by mouth at bedtime.       LABORATORY STUDIES CBC    Component Value Date/Time   WBC 4.4 02/21/2018 0506   RBC 4.29 02/21/2018 0506   HGB 12.0 02/21/2018  0506   HGB 12.2 06/23/2014 0503   HCT 37.2 02/21/2018 0506   HCT 37.2 06/23/2014 0503   PLT 202 02/21/2018 0506   PLT 251 06/23/2014 0503   MCV 86.7 02/21/2018 0506   MCV 89 06/23/2014 0503   MCH 28.0 02/21/2018 0506   MCHC 32.3 02/21/2018 0506   RDW 13.2 02/21/2018 0506   RDW 13.6 06/23/2014 0503   LYMPHSABS 2.2 02/18/2018 0002   LYMPHSABS 1.8 06/23/2014 0503   MONOABS 0.6 02/18/2018 0002   MONOABS 0.4 06/23/2014 0503   EOSABS 0.0 02/18/2018 0002   EOSABS 0.0 06/23/2014 0503   BASOSABS 0.0 02/18/2018 0002   BASOSABS 0.0 06/23/2014 0503   CMP    Component Value Date/Time   NA 139 02/23/2018 0308   NA 138 06/23/2014 0503   K 3.8 02/23/2018  0308   K 4.2 06/23/2014 0503   CL 108 02/23/2018 0308   CL 104 06/23/2014 0503   CO2 22 02/23/2018 0308   CO2 27 06/23/2014 0503   GLUCOSE 91 02/23/2018 0308   GLUCOSE 246 (H) 06/23/2014 0503   BUN 15 02/23/2018 0308   BUN 17 06/23/2014 0503   CREATININE 0.88 02/23/2018 0308   CREATININE 0.86 06/23/2014 0503   CALCIUM 9.4 02/23/2018 0308   CALCIUM 8.7 06/23/2014 0503   PROT 7.7 02/18/2018 0002   PROT 6.5 06/22/2014 1432   ALBUMIN 3.9 02/18/2018 0002   ALBUMIN 3.1 (L) 06/22/2014 1432   AST 44 (H) 02/18/2018 0002   AST 41 (H) 06/22/2014 1432   ALT 30 02/18/2018 0002   ALT 45 06/22/2014 1432   ALKPHOS 105 02/18/2018 0002   ALKPHOS 123 (H) 06/22/2014 1432   BILITOT 0.6 02/18/2018 0002   BILITOT 0.4 06/22/2014 1432   GFRNONAA >60 02/23/2018 0308   GFRNONAA >60 06/23/2014 0503   GFRAA >60 02/23/2018 0308   GFRAA >60 06/23/2014 0503   COAGS Lab Results  Component Value Date   INR 0.88 02/18/2018   INR 0.95 11/29/2012   Lipid Panel    Component Value Date/Time   CHOL 243 (H) 02/19/2018 0301   CHOL 211 (H) 06/22/2014 1432   TRIG 349 (H) 02/19/2018 0301   TRIG 473 (H) 06/22/2014 1432   HDL 37 (L) 02/19/2018 0301   HDL 42 06/22/2014 1432   CHOLHDL 6.6 02/19/2018 0301   VLDL 70 (H) 02/19/2018 0301   VLDL SEE COMMENT 06/22/2014 1432   LDLCALC 136 (H) 02/19/2018 0301   LDLCALC SEE COMMENT 06/22/2014 1432   HgbA1C  Lab Results  Component Value Date   HGBA1C 9.1 (H) 02/19/2018   Urinalysis    Component Value Date/Time   COLORURINE COLORLESS (A) 02/18/2018 0003   APPEARANCEUR CLEAR (A) 02/18/2018 0003   LABSPEC 1.006 02/18/2018 0003   PHURINE 7.0 02/18/2018 0003   GLUCOSEU 150 (A) 02/18/2018 0003   HGBUR SMALL (A) 02/18/2018 0003   HGBUR moderate 04/04/2008 0848   BILIRUBINUR NEGATIVE 02/18/2018 0003   BILIRUBINUR neg 10/06/2017 1036   KETONESUR NEGATIVE 02/18/2018 0003   PROTEINUR NEGATIVE 02/18/2018 0003   UROBILINOGEN 0.2 10/06/2017 1036   UROBILINOGEN 0.2  11/29/2012 1350   NITRITE NEGATIVE 02/18/2018 0003   LEUKOCYTESUR NEGATIVE 02/18/2018 0003   Urine Drug Screen     Component Value Date/Time   LABOPIA NONE DETECTED 02/18/2018 0003   COCAINSCRNUR NONE DETECTED 02/18/2018 0003   LABBENZ TEST NOT PERFORMED, REAGENT NOT AVAILABLE (A) 02/18/2018 0003   AMPHETMU NONE DETECTED 02/18/2018 0003   THCU NONE DETECTED 02/18/2018 0003  LABBARB NONE DETECTED 02/18/2018 0003    Alcohol Level    Component Value Date/Time   ETH <10 02/18/2018 0002     SIGNIFICANT DIAGNOSTIC STUDIES Ct Head Code Stroke Wo Contrast 02/18/2018 IMPRESSION:  1. No hemorrhage or mass effect.  2. ASPECTS is 10.   Ct Angio Head W Or Wo Contrast Ct Angio Neck W Or Wo Contrast 02/18/2018 IMPRESSION:  No emergent large vessel occlusion. Normal CTA of the head and neck   Mr Brain Wo Contrast 02/18/2018 IMPRESSION:  1. Acute small vessel infarct of the right basal ganglia, in close proximity to the right internal capsule, in keeping with reported left-sided weakness.  2. No hemorrhage or mass effect.   TTE - Left ventricle: The cavity size was normal. Wall thickness wasincreased in a pattern of mild LVH. Systolic function was normal.The estimated ejection fraction was in the range of 55% to 60%.Wall motion was normal; there were no regional wall motionabnormalities. Doppler parameters are consistent with abnormalleft ventricular relaxation (grade 1 diastolic dysfunction). Impressions: - No cardiac source of emboli was indentified.     HISTORY OF PRESENT ILLNESS [name] is an 62 y.o. female with past medical history of diabetes mellitus, hyperlipidemia, ulcerative colitis, asthma presents to the emergency department at [facility] as a code stroke for sudden onset slurred speech and left-sided weakness.  Last known normal well was around 11 PM on 02/17/2018 and patient suddenly had slurred speech and left-sided weakness.  Also complained  of dizziness.  She was evaluated by EDP and teleneurology specialists and received IV TPA after negative CT head.  CT angiogram was performed which showed no large vessel occlusion.  NIHSS: 5 on assessment by tele-neurologist. Patient was transferred to [facility] for further management.  On assessment patient continued to have weakness on the left side and dysarthria.  Pressure has remained below 638 systolic.  She is not on any aspirin at home. Baseline MRS 0    HOSPITAL COURSE [name] is a 63 y.o. female with history of DM, HLD, ulcerative colitis admitted for left facial droop and left hemiparesis. tPA given. Infarct due to Small vessel disease. Has vascular risk factors of HTN. HLD and DB. Found to have HIV during stroke workup. ID consulted. Started on biktarvy. Serum crptococcal neg. Considering LP if CD4>200. Stroke plans treatment with aspirin and plavix, now on aspirin but plavix on hold under after LP performed if needed. Still with L HP requiring inpatient rehab. Awaiting transfer there.   Stroke:  right small BG infarct likely secondary to small vessel disease source  Resultant left facial droop and left hemiparesis  MRI right small BG infarct  CTA head and neck unremarkable  2D Echo EF 55-60%  LDL 136  HgbA1c 9.1  UDS negative  No antithrombotic prior to admission, now on ASA 81. Add Plavix with aspirin x 3 weeks then aspirin alone.   Patient counseled to be compliant with her antithrombotic medications  Therapy recommendations:  CIR  Disposition:  CIR.   Diabetes  HgbA1c 9.1 goal < 7.0  Uncontrolled  Hypoglycemia again yesterday - down to 20   DB RN following  Have stopped all home diabetic meds d/t hypoglycemia  Continue CBG monitoring and SSI on rehab  Continue DM education and close follow up on rehab unit  Hypertension  Stable, normal range  Long term BP goal normotensive  Hyperlipidemia  Home meds:  none   LDL 136, goal  < 70  On lipitor  40  Continue statin at discharge  Migraine   Hx of migraine   HA for the last 2 days  On fioricet and tylenol PRN  On topamax 40m Qhs, will titrate to 559mbid in 5 days (02/25/18).   Consider to follow up with Dr. AhJaynee Eagless outpt  HIV positive  New diagnosis - do not mention it to other family members as per pt request  HIV positive on serum testing x 2  ID on board  Viral load 5.0 and CD4 500  RPR, HBV and HCV negative  Serum crypto Ag - negative - no LP needed as CD4 > 200  Other Stroke Risk Factors  High TG - on lipitor  Other Active Problems  Ulcerative colitis  Hypokalemia - supplement -> 3.8  mg level normal  Elevated creatinine,resolved 1.03->0.94->0..8Marland Kitchen   DISCHARGE EXAM Blood pressure 119/74, pulse 85, temperature 98.8 F (37.1 C), temperature source Oral, resp. rate 16, height 5' 2"  (1.575 m), weight 67.7 kg, SpO2 93 %. General - Well nourished, well developed, in no apparent distress.  Cardiovascular - Regular rate and rhythm.  Mental Status -  Level of arousal and orientation to time, place, and person were intact. Language including expression, naming, repetition, comprehension was assessed and found intact. Fund of Knowledge was assessed and was intact.  Cranial Nerves II - XII - II - Visual field intact OU. III, IV, VI - Extraocular movements intact. V - Facial sensation intact bilaterally. VII - mild left facial droop. Mild OS corner ptosis VIII - Hearing & vestibular intact bilaterally. X - Palate elevates symmetrically, mild dysarthria. XI - Chin turning & shoulder shrug intact bilaterally. XII - Tongue protrusion intact.  Motor Strength - The patient's strength was normal in RUE and RLE, however, LUE 4+/5 proximal and 3/5 distal and LLE 5-/5 and pronator drift was absent.  Bulk was normal and fasciculations were absent.   Motor Tone - Muscle tone was assessed at the neck and appendages and was  normal.  Reflexes - The patient's reflexes were symmetrical in all extremities and she had no pathological reflexes.  Sensory - Light touch, temperature/pinprick were assessed and were symmetrical.    Coordination - The patient had normal movements in the right hand with no ataxia or dysmetria.  Tremor was absent.  Gait and Station - deferred.  Discharge Diet   Diet Order            Diet heart healthy/carb modified Room service appropriate? Yes; Fluid consistency: Thin  Diet effective now             liquids  DISCHARGE PLAN  Disposition:  Transfer to [facility] ongoing PT, OT and ST  aspirin 81 mg daily and clopidogrel 75 mg daily for secondary stroke prevention x 3 weeks then aspirin alone  Rcommend ongoing risk factor control by Primary Care Physician at time of discharge from inpatient rehabilitation.  Follow-up BaJearld FentonNP in 2 weeks following discharge from rehab.  Follow-up in Dr. AhJaynee Eaglesn 4 weeks following discharge from rehab, office to schedule an appointment.   35 minutes were spent preparing discharge.  ShBurnetta SabinMSN, APRN, ANVP-BC, AGPCNP-BC Advanced Practice Stroke Nurse [place] Schedule & Pager information 02/23/2018 3:46 PM   ATTENDING NOTE: I reviewed above note and agree with the assessment and plan. I have made any additions or clarifications directly to the above note. Pt was seen and examined.   Patient  stable, no neuro changes.  No acute event overnight.  CD4 500.  No LP needed as per ID.  Will resume Plavix on discharge.  Continue DAPT for 3 weeks and then ASA alone.  PT/OT recommend CIR, ready for transfer to CIR today.  Titrating Topamax as scheduled, recommend to follow-up with Dr. Jaynee Eagles at [facility] for stroke and migraine.  Rosalin Hawking, MD PhD Stroke Neurology 02/23/2018 4:47 PM

## 2018-02-23 NOTE — Plan of Care (Signed)
Nutrition Education Note  RD consulted for nutrition education regarding diabetes.  Spoke with pt and daughter at bedside.  Obtained dietary recall: Breakfast: typically does not eat but may have bacon cooked in the oven or egg whites Lunch: pork sandwich with greens, corn, and potatoes Dinner: similar to lunch  Pt states she doesn't like bread except on pork sandwiches. Pt reports eating a lot of salad with grilled chicken, collards, turnips, fish, and shrimp. Pt states she drinks sweet tea and [facility] throughout the day. Pt's daughter offered that pt may like sugar-free carbonated water drinks. Pt is willing to try these after discharge.  Lab Results  Component Value Date   HGBA1C 9.1 (H) 02/19/2018    RD provided "Carbohydrate Counting for People with Diabetes" handout from the Academy of Nutrition and Dietetics. Discussed different food groups and their effects on blood sugar, emphasizing carbohydrate-containing foods. Provided list of carbohydrates and recommended serving sizes of common foods.  Discussed importance of controlled and consistent carbohydrate intake throughout the day. Provided examples of ways to balance meals/snacks and encouraged intake of high-fiber, whole grain complex carbohydrates. Teach back method used.  Expect fair compliance.  Body mass index is 27.3 kg/m. Pt meets criteria for "overweight" based on current BMI.  Current diet order is Heart Healthy/Carb Modified, patient is consuming approximately 50% of meals at this time. Labs and medications reviewed. No further nutrition interventions warranted at this time. RD contact information provided. If additional nutrition issues arise, please re-consult RD.   Gaynell Face, MS, RD, LDN Pager: [phone] Weekend/After Hours: [phone]

## 2018-02-23 NOTE — H&P (Signed)
Physical Medicine and Rehabilitation Admission H&P    : HPI: [name] is a 62 year old right-handed female with history of asthma, diabetes mellitus, ulcerative colitis.  Lives with spouse independent prior to admission.  Daughter can provide assistance during the day and husband at night.  Presented 02/18/2018 to [facility] with left-sided weakness and slurred speech.  Cranial CT scan with no hemorrhage or mass-effect.  Patient did receive TPA.  CT angiogram of head and neck with no emergent large vessel occlusion.  MRI showed small vessel infarction of the right basal ganglia and close proximity to the right internal capsule.  Echocardiogram with ejection fraction of 23% grade 1 diastolic dysfunction.  Neurology consulted maintained on aspirin and Plavix x3 weeks then aspirin alone.  Tolerating a regular diet.  As part of patient's admission for CVA she was tested for HIV disease which was positive.  Infectious disease consulted and plan HIV treatment with CD4 28.  Physical and occupational therapy evaluations completed patient was admitted for a comprehensive rehab program.  Review of Systems  Constitutional: Negative for chills and fever.  HENT: Negative for hearing loss.   Eyes: Negative for blurred vision and double vision.  Respiratory: Negative for cough and shortness of breath.   Cardiovascular: Positive for leg swelling. Negative for palpitations.  Gastrointestinal: Positive for constipation. Negative for nausea and vomiting.  Genitourinary: Negative for dysuria, flank pain and hematuria.  Musculoskeletal: Positive for myalgias.  Skin: Negative for rash.  Neurological: Positive for speech change and focal weakness.  All other systems reviewed and are negative.  Past Medical History:  Diagnosis Date  . Asthma   . Diabetes mellitus without complication ([place]) 5-36-14   hx. NIDDM-dx. 3 weeks ago  . Hyperlipidemia    hx  . Seasonal allergies   .  Ulcerative colitis [facility])    Past Surgical History:  Procedure Laterality Date  . BUNIONECTOMY     bilateral and toe nails of big toes removed  . CESAREAN SECTION  1997/2006  . CHOLECYSTECTOMY N/A 12/01/2012   Procedure: LAPAROSCOPIC CHOLECYSTECTOMY;  Surgeon: Stark Klein, MD;  Location: [facility] ORS;  Service: General;  Laterality: N/A;  . COLONOSCOPY W/ POLYPECTOMY    . EYE SURGERY Bilateral 2013  . TONSILLECTOMY     Family History  Problem Relation Age of Onset  . Hypertension Mother   . Diabetes Mother   . Cancer Father        lung ca  . Seizures Daughter        74yr   Social History:  reports that she has never smoked. She has never used smokeless tobacco. She reports that she drinks alcohol. She reports that she does not use drugs. Allergies:  Allergies  Allergen Reactions  . Azithromycin Itching    Muscle aches  . Erythromycin Nausea And Vomiting  . Penicillins Nausea And Vomiting    Has patient had a PCN reaction causing immediate rash, facial/tongue/throat swelling, SOB or lightheadedness with hypotension: Yes Has patient had a PCN reaction causing severe rash involving mucus membranes or skin necrosis: No Has patient had a PCN reaction that required hospitalization No Has patient had a PCN reaction occurring within the last 10 years: No If all of the above answers are "NO", then may proceed with Cephalosporin use.   . Tetracycline Nausea And Vomiting   Medications Prior to Admission  Medication Sig Dispense Refill  . albuterol (PROVENTIL HFA;VENTOLIN HFA) 108 (90 Base) MCG/ACT inhaler Inhale 2 puffs into the  lungs every 6 (six) hours as needed for wheezing or shortness of breath. 1 Inhaler 0  . glipiZIDE (GLUCOTROL) 10 MG tablet Take 1 tablet (10 mg total) by mouth 2 (two) times daily before a meal. 60 tablet 3  . ondansetron (ZOFRAN) 4 MG tablet Take 1 tablet (4 mg total) by mouth every 8 (eight) hours as needed. 20 tablet 0  . sertraline (ZOLOFT) 25 MG tablet Take 1  tablet (25 mg total) by mouth at bedtime. (Patient not taking: Reported on 02/18/2018) 30 tablet 2    Drug Regimen Review Drug regimen was reviewed and remains appropriate with no significant issues identified  Home: Home Living Family/patient expects to be discharged to:: Private residence Living Arrangements: Spouse/significant other Available Help at Discharge: Family, Available 24 hours/day Type of Home: House Home Access: Level entry Home Layout: Two level Alternate Level Stairs-Number of Steps: 10 Alternate Level Stairs-Rails: Left(going up) Bathroom Shower/Tub: Gaffer, Door [employer] Toilet: Standard Home Equipment: None   Functional History: Prior Function Level of Independence: Independent  Functional Status:  Mobility: Bed Mobility Overal bed mobility: Needs Assistance Bed Mobility: Supine to Sit, Sit to Supine Supine to sit: Supervision Sit to supine: Supervision General bed mobility comments: supervision for safety, VCs for attention to things in the bed on her left side Transfers Overall transfer level: Needs assistance Equipment used: None Transfers: Sit to/from Stand, Stand Pivot Transfers Sit to Stand: Min guard Stand pivot transfers: Min guard General transfer comment: Min guard, initial posterior instability but no physical assist required Ambulation/Gait Ambulation/Gait assistance: Min guard Gait Distance (Feet): (Hallway ambulation for gait retraining) Assistive device: None Gait Pattern/deviations: Step-through pattern General Gait Details: gait retraining tasks performed with 4 bouts of 100'. Min guard for safety and stability, ocassional cues for environment left side Gait velocity: decreased Gait velocity interpretation: 1.31 - 2.62 ft/sec, indicative of limited community ambulator    ADL: ADL Overall ADL's : Needs assistance/impaired Eating/Feeding: Supervision/ safety, Set up Eating/Feeding Details (indicate cue type and reason): pt.  c/o lip biting on L lower lip while chewing, also concerned with "drooping" appearance of face Grooming: Brushing hair, Standing, Minimal assistance, Cueing for compensatory techniques(applying perfume, donning wig, applying make up) Grooming Details (indicate cue type and reason): occasional LOB to left,  cues to correct, focus on B hand integration. pt. able to stabalize hair gel with L hand and apply pressure to squeeze it into palm of R hand. used both hands to hold make up sponge to apply make up Upper Body Bathing: Minimal assistance Upper Body Bathing Details (indicate cue type and reason): supported sitting Lower Body Bathing: Maximal assistance Lower Body Bathing Details (indicate cue type and reason): min A +2 sit<>stand Upper Body Dressing : Moderate assistance Upper Body Dressing Details (indicate cue type and reason): supported sitting Lower Body Dressing: Maximal assistance Lower Body Dressing Details (indicate cue type and reason): min A sit<>stand Toilet Transfer: Moderate assistance, +2 for safety/equipment, Ambulation, Comfort height toilet, Grab bars Toilet Transfer Details (indicate cue type and reason): Bil HHA Toileting- Clothing Manipulation and Hygiene: Moderate assistance Toileting - Clothing Manipulation Details (indicate cue type and reason): min A +2 sit<>stand Functional mobility during ADLs: Min guard(furniture walks in room without device) General ADL Comments: min a for standing tasks, cues for use of both hands and compensatory techniques.  pt. eager and pleased with completing tasks on her own.    Cognition: Cognition Overall Cognitive Status: Within Functional Limits for tasks assessed Orientation Level: Oriented X4  Cognition Arousal/Alertness: Awake/alert Behavior During Therapy: WFL for tasks assessed/performed Overall Cognitive Status: Within Functional Limits for tasks assessed  Physical Exam: Blood pressure 119/74, pulse 85, temperature 98.8 F  (37.1 C), temperature source Oral, resp. rate 16, height 5' 2"  (1.575 m), weight 67.7 kg, SpO2 93 %. Physical Exam  Vitals reviewed. Constitutional: No distress.  HENT:  Head: Normocephalic and atraumatic.  Eyes: Pupils are equal, round, and reactive to light. Conjunctivae and EOM are normal. Left eye exhibits no discharge.  Neck: Normal range of motion. Neck supple. No tracheal deviation present. No thyromegaly present.  Cardiovascular: Normal rate, regular rhythm and normal heart sounds. Exam reveals no friction rub.  No murmur heard. Respiratory: Effort normal and breath sounds normal. No respiratory distress.  GI: Soft. Bowel sounds are normal. She exhibits no distension.  Musculoskeletal: She exhibits no edema, tenderness or deformity.  Neurological:  Speech slightly dysarthric but intelligible. Left central 7. LUE 2+/5 prox to distal. LLE 2+ to 3+/5 prox to distal. RUE and RLE 4+/5. Sensory decreased to LT in left arm/leg. Fair insight and awareness. Normal language.   Skin: Skin is warm and dry. She is not diaphoretic.  Long finger nails  Psychiatric: She has a normal mood and affect. Her behavior is normal.    Results for orders placed or performed during the hospital encounter of 02/18/18 (from the past 48 hour(s))  RPR     Status: None   Collection Time: 02/21/18  3:39 PM  Result Value Ref Range   RPR Ser Ql Non Reactive Non Reactive    Comment: (NOTE) Performed At: [facility] [address] [place], [place] [id number] Rush Farmer MD OY:[id number]   Glucose, capillary     Status: None   Collection Time: 02/21/18  3:50 PM  Result Value Ref Range   Glucose-Capillary 77 70 - 99 mg/dL   Comment 1 Notify RN    Comment 2 Document in Chart   Glucose, capillary     Status: None   Collection Time: 02/21/18  9:25 PM  Result Value Ref Range   Glucose-Capillary 84 70 - 99 mg/dL   Comment 1 Notify RN    Comment 2 Document in Chart   RPR     Status: None    Collection Time: 02/22/18  3:28 AM  Result Value Ref Range   RPR Ser Ql Non Reactive Non Reactive    Comment: (NOTE) Performed At: [facility] [place], [place] [id number] Rush Farmer MD GG:[id number]   Glucose, capillary     Status: Abnormal   Collection Time: 02/22/18  3:49 AM  Result Value Ref Range   Glucose-Capillary 61 (L) 70 - 99 mg/dL  Glucose, capillary     Status: Abnormal   Collection Time: 02/22/18  4:34 AM  Result Value Ref Range   Glucose-Capillary 109 (H) 70 - 99 mg/dL  T-helper cells (CD4) count (not at [facility])     Status: Abnormal   Collection Time: 02/22/18  9:17 AM  Result Value Ref Range   CD4 T Cell Abs 500 400 - 2,700 /uL   CD4 % Helper T Cell 28 (L) 33 - 55 %    Comment: Performed at [facility], [place] [address] [place],  [address]  Glucose, capillary     Status: Abnormal   Collection Time: 02/22/18 11:09 AM  Result Value Ref Range   Glucose-Capillary 128 (H) 70 - 99 mg/dL  Glucose, capillary     Status: None  Collection Time: 02/22/18  4:44 PM  Result Value Ref Range   Glucose-Capillary 94 70 - 99 mg/dL  Glucose, capillary     Status: Abnormal   Collection Time: 02/22/18  8:27 PM  Result Value Ref Range   Glucose-Capillary 66 (L) 70 - 99 mg/dL   Comment 1 Notify RN    Comment 2 Document in Chart   Glucose, capillary     Status: Abnormal   Collection Time: 02/22/18  9:36 PM  Result Value Ref Range   Glucose-Capillary 127 (H) 70 - 99 mg/dL   Comment 1 Notify RN    Comment 2 Document in Chart   Basic metabolic panel     Status: None   Collection Time: 02/23/18  3:08 AM  Result Value Ref Range   Sodium 139 135 - 145 mmol/L   Potassium 3.8 3.5 - 5.1 mmol/L   Chloride 108 98 - 111 mmol/L   CO2 22 22 - 32 mmol/L   Glucose, Bld 91 70 - 99 mg/dL   BUN 15 8 - 23 mg/dL   Creatinine, Ser 0.88 0.44 - 1.00 mg/dL   Calcium 9.4 8.9 - 10.3 mg/dL   GFR calc non Af Amer >60 >60 mL/min   GFR calc Af Amer >60  >60 mL/min    Comment: (NOTE) The eGFR has been calculated using the CKD EPI equation. This calculation has not been validated in all clinical situations. eGFR's persistently <60 mL/min signify possible Chronic Kidney Disease.    Anion gap 9 5 - 15    Comment: Performed at [facility] [address], [place], [place] [address]  Magnesium     Status: None   Collection Time: 02/23/18  3:08 AM  Result Value Ref Range   Magnesium 2.1 1.7 - 2.4 mg/dL    Comment: Performed at [facility] [address] [place], [place] [address]  Glucose, capillary     Status: Abnormal   Collection Time: 02/23/18  6:10 AM  Result Value Ref Range   Glucose-Capillary 102 (H) 70 - 99 mg/dL   Comment 1 Notify RN    Comment 2 Document in Chart   Glucose, capillary     Status: None   Collection Time: 02/23/18 11:36 AM  Result Value Ref Range   Glucose-Capillary 76 70 - 99 mg/dL   Comment 1 Notify RN    Comment 2 Document in Chart    No results found.     Medical Problem List and Plan: 1.  Left-sided weakness with slurred speech secondary to right basal ganglia infarction secondary to small vessel disease  -admit to inpatien rehab 2.  DVT Prophylaxis/Anticoagulation: SCDs 3. Pain Management: Topamax 50 mg nightly, Fioricet as needed 4. Mood: Zoloft 25 mg dailly 5. Neuropsych: This patient is capable of making decisions on her own behalf. 6. Skin/Wound Care: Routine skin checks 7. Fluids/Electrolytes/Nutrition: Routine in and outs with follow-up chemistries upon admission 8.  Diabetes mellitus with peripheral neuropathy.  Hemoglobin A1c 9.1.  Glucotrol 5 mg twice daily.    -Check blood sugars before meals and at bedtime.    -Diabetic teaching  -adjust regimen as indicated 9.  Newly diagnosed HIV.  Follow-up infectious disease  -CD4 count 500  -.Biktarvy 50-200--25 mg daily 10.  Hyperlipidemia.  Lipitor 11.  History of asthma.  Albuterol inhaler as needed       Cathlyn Parsons, PA-C 02/23/2018

## 2018-02-23 NOTE — Progress Notes (Signed)
   INFECTIOUS DISEASE PROGRESS NOTE  ID: [name] is a 62 y.o. female with  Active Problems:   Stroke (cerebrum) ([place])   HIV disease ([place])  Subjective: No complaints.   Abtx:  Anti-infectives (From admission, onward)   None      Medications:  Scheduled: . aspirin EC  81 mg Oral Daily  . atorvastatin  40 mg Oral q1800  . insulin aspart  0-9 Units Subcutaneous TID AC & HS  . sertraline  25 mg Oral QHS  . topiramate  50 mg Oral QHS    Objective: Vital signs in last 24 hours: Temp:  [97.8 F (36.6 C)-98.9 F (37.2 C)] 98.8 F (37.1 C) (08/13 1242) Pulse Rate:  [75-94] 85 (08/13 1242) Resp:  [15-18] 16 (08/13 1242) BP: (108-128)/(66-84) 119/74 (08/13 1242) SpO2:  [88 %-100 %] 93 % (08/13 1242)   General appearance: alert, cooperative and no distress Resp: clear to auscultation bilaterally Cardio: regular rate and rhythm GI: normal findings: bowel sounds normal and soft, non-tender Neurologic: Cranial nerves: VII: lower facial muscle function abnormal on the left  Lab Results Recent Labs    02/21/18 0506 02/23/18 0308  WBC 4.4  --   HGB 12.0  --   HCT 37.2  --   NA 137 139  K 3.3* 3.8  CL 105 108  CO2 22 22  BUN 16 15  CREATININE 0.94 0.88   Liver Panel No results for input(s): PROT, ALBUMIN, AST, ALT, ALKPHOS, BILITOT, BILIDIR, IBILI in the last 72 hours. Sedimentation Rate No results for input(s): ESRSEDRATE in the last 72 hours. C-Reactive Protein No results for input(s): CRP in the last 72 hours.  Microbiology: Recent Results (from the past 240 hour(s))  MRSA PCR Screening     Status: None   Collection Time: 02/18/18  6:51 AM  Result Value Ref Range Status   MRSA by PCR NEGATIVE NEGATIVE Final    Comment:        The GeneXpert MRSA Assay (FDA approved for NASAL specimens only), is one component of a comprehensive MRSA colonization surveillance program. It is not intended to diagnose MRSA infection nor to guide or monitor treatment  for MRSA infections. Performed at [facility], [place] [address] [place], [place] [address]     Studies/Results: No results found.   Assessment/Plan: HIV+ CVA  Total days of antibiotics: 0  Will start her biktarvy She agreed to start "medicine"  I could not explain further as she had family in room.  Need to let DIS know for partner testing.  Her high CD4 predicts a good response to ART.  Her genotype is pending.          Bobby Rumpf MD, FACP Infectious Diseases (pager) [phone] [email or web address] 02/23/2018, 1:13 PM  LOS: 5 days

## 2018-02-23 NOTE — Progress Notes (Signed)
Inpatient Rehabilitation  Continuing to follow for timing of medical readiness and insurance authorization for IP Rehab.  Hopeful for a decision today, plan to update the team when I know.    Carmelia Roller., CCC/SLP Admission Coordinator  [place]  Cell [phone]

## 2018-02-23 NOTE — Progress Notes (Addendum)
Inpatient Diabetes Program Recommendations  AACE/ADA: New Consensus Statement on Inpatient Glycemic Control (2019)  Target Ranges:  Prepandial:   less than 140 mg/dL      Peak postprandial:   less than 180 mg/dL (1-2 hours)      Critically ill patients:  140 - 180 mg/dL   Results for Shannon, Wade (MRN [id number]) as of 02/23/2018 11:09  Ref. Range 02/22/2018 04:34 02/22/2018 11:09 02/22/2018 16:44 02/22/2018 20:27 02/22/2018 21:36 02/23/2018 06:10  Glucose-Capillary Latest Ref Range: 70 - 99 mg/dL 109 (H) 128 (H) 94 66 (L) 127 (H) 102 (H)  Results for Shannon, Wade (MRN [id number]) as of 02/23/2018 11:09  Ref. Range 02/19/2018 03:01  Hemoglobin A1C Latest Ref Range: 4.8 - 5.6 % 9.1 (H)   Review of Glycemic Control  Diabetes history: DM2 Outpatient Diabetes medications: Glipizide 5 mg BID Current orders for Inpatient glycemic control: Glipizide 5 mg BID, Novolog 0-9 units TID with meals and at bedtime  Inpatient Diabetes Program Recommendations:  Oral Agents: Please consider discontinuing Glipizide while inpatient. HgbA1C: A1C 9.1% on 02/19/2018 indicating an average glucose of 214 mg/dl over the past 2-3 months.  Addendum 02/23/18@12 :45-Spoke with patient about diabetes and home regimen for diabetes control. Patient reports that she is followed by PCP for diabetes management and currently she takes Glipizide 5 mg BID as an outpatient for diabetes control. Patient reports that she is Glipizide as prescribed. Patient states that she checks her glucose 1 time per day (randomly) and it ranges from 100-205 mg/dl. Inquired about how she felt when her glucose was down to 66 mg/dl yesterday and patient confirms she was experiencing symptoms of hypoglycemia. Patient denies any hypoglycemia at home. Explained to patient that it is recommended that Glipizide be stopped while inpatient due to hypoglycemia for past 2 days.  Inquired about prior A1C and patient reports that she does not recall her last A1C  value. Discussed A1C results (9.1% on 02/19/2018) and explained that her current A1C indicates an average glucose of 214 mg/dl over the past 2-3 months. Discussed glucose and A1C goals. Explained that glucose may be getting more elevated throughout the day and it would be beneficial to check her glucose more often. Discussed importance of checking CBGs and maintaining good CBG control to prevent long-term and short-term complications.  Discussed impact of nutrition, exercise, stress, sickness, and medications on diabetes control. Patient states that she does not really follow a carb modified diet. Patient admits that she drinks regular Mt Dew and she does not like diet sodas or water.  Discussed carbohydrates, carbohydrate goals per day and meal, along with portion sizes. Patient feels she would benefit from RD consult for further diet education. Encouraged patient to try to eliminate sugary beverages and to cut down on portion size of potatoes and pasta.  Encouraged patient to check her glucose 2-3 times per day and to keep a log book of glucose readings which she will need to take to doctor appointments. Explained how the doctor can use the log book to continue to make adjustments with DM medications if needed. Explained to patient that if she made dietary changes, she may even need to cut down on Glipizide if she experiences hypoglycemia like she has as an inpatient with Glipizide 5 mg BID. Patient verbalized understanding of information discussed and she states that she has no further questions at this time related to diabetes.  Thanks, Shannon Alderman, RN, MSN, CDE Diabetes Coordinator Inpatient Diabetes Program [phone] (Team Pager from 8am to 5pm)

## 2018-02-23 NOTE — Progress Notes (Signed)
Physical Therapy Treatment Patient Details Name: Ranyia Witting MRN: [id number] DOB: [birth year] Today's Date: 02/23/2018    History of Present Illness Puneet Selden is an 62 y.o. female with past medical history of diabetes mellitus, hyperlipidemia, ulcerative colitis, asthma presents to the emergency department at [facility] as a code stroke for sudden onset slurred speech and left-sided weakness.    PT Comments    Patient seen for activity progression. Making steady gains with mobility, balance, and overall activity tolerance. Patient was able to tolerate advanced gait training activities today with focus on dynamic performance and righting reactions. Patient continues to demonstrate some spatial/environmental awareness deficits to the left but overall, much improved this session compared to previous sessions.  Patient is highly motivated and really wants to pursue comprehensive therapies to get back to pure independence. Educated patient on importance of safety and attention to left side with activities. At this time, will continue to see and progress as tolerated.   Follow Up Recommendations  CIR(vs outpatient PT services)     Equipment Recommendations  None recommended by PT    Recommendations for Other Services      Precautions / Restrictions Precautions Precautions: Fall Restrictions Weight Bearing Restrictions: No    Mobility  Bed Mobility Overal bed mobility: Needs Assistance Bed Mobility: Supine to Sit;Sit to Supine     Supine to sit: Supervision Sit to supine: Supervision   General bed mobility comments: supervision for safety, VCs for attention to things in the bed on her left side  Transfers Overall transfer level: Needs assistance Equipment used: None Transfers: Sit to/from Stand;Stand Pivot Transfers Sit to Stand: Min guard         General transfer comment: Min guard, initial posterior instability but no physical assist  required  Ambulation/Gait Ambulation/Gait assistance: Min guard   Assistive device: None Gait Pattern/deviations: Step-through pattern Gait velocity: decreased Gait velocity interpretation: 1.31 - 2.62 ft/sec, indicative of limited community ambulator General Gait Details: gait retraining tasks performed with 4 bouts of 100'. Min guard for safety and stability, ocassional cues for environment left side   Stairs             Wheelchair Mobility    Modified Rankin (Stroke Patients Only) Modified Rankin (Stroke Patients Only) Pre-Morbid Rankin Score: No symptoms Modified Rankin: Moderately severe disability     Balance Overall balance assessment: Needs assistance Sitting-balance support: No upper extremity supported;Feet supported Sitting balance-Leahy Scale: Good     Standing balance support: No upper extremity supported Standing balance-Leahy Scale: Fair Standing balance comment: able to perform dyanmic tasks and take on challenge in standing               High Level Balance Comments: Patient with some instability, difficulty with start stop on command poor righting reaction time requiring ocassional hands on assist to maintain balance.             Cognition Arousal/Alertness: Awake/alert Behavior During Therapy: WFL for tasks assessed/performed Overall Cognitive Status: Within Functional Limits for tasks assessed                                        Exercises Other Exercises Other Exercises: dynamic gait retraining 4 x 100' with speed changes and alterred stride performance. Other Exercises: min  squats with UE dynamic involvement (reaching and clapping activities)    General Comments  Pertinent Vitals/Pain Pain Assessment: Faces Faces Pain Scale: Hurts a little bit Pain Location: headache Pain Descriptors / Indicators: Aching Pain Intervention(s): Monitored during session    Home Living                       Prior Function            PT Goals (current goals can now be found in the care plan section) Acute Rehab PT Goals Patient Stated Goal: to rehab then home PT Goal Formulation: With patient Time For Goal Achievement: 03/04/18 Potential to Achieve Goals: Good Progress towards PT goals: Progressing toward goals    Frequency    Min 4X/week      PT Plan Current plan remains appropriate    Co-evaluation              AM-PAC PT "6 Clicks" Daily Activity  Outcome Measure  Difficulty turning over in bed (including adjusting bedclothes, sheets and blankets)?: A Little Difficulty moving from lying on back to sitting on the side of the bed? : A Little Difficulty sitting down on and standing up from a chair with arms (e.g., wheelchair, bedside commode, etc,.)?: A Little Help needed moving to and from a bed to chair (including a wheelchair)?: A Little Help needed walking in hospital room?: A Little Help needed climbing 3-5 steps with a railing? : A Little 6 Click Score: 18    End of Session Equipment Utilized During Treatment: Gait belt Activity Tolerance: Patient tolerated treatment well Patient left: in bed;with call bell/phone within reach Nurse Communication: Mobility status PT Visit Diagnosis: Unsteadiness on feet (R26.81);Hemiplegia and hemiparesis;Pain Hemiplegia - Right/Left: Left Hemiplegia - dominant/non-dominant: Non-dominant Hemiplegia - caused by: Cerebral infarction Pain - Right/Left: Left Pain - part of body: Arm     Time: 4034-7425 PT Time Calculation (min) (ACUTE ONLY): 17 min  Charges:  $Gait Training: 8-22 mins                     Alben Deeds, PT DPT  Board Certified Neurologic Specialist [place] 02/23/2018, 2:26 PM

## 2018-02-23 NOTE — Progress Notes (Signed)
STROKE TEAM PROGRESS NOTE   SUBJECTIVE (INTERVAL HISTORY) Young female at bedside. Pt in bed. States she feels the same. Is hopeful for transfer to CIR today.  OBJECTIVE Temp:  [97.8 F (36.6 C)-98.9 F (37.2 C)] 98.7 F (37.1 C) (08/13 0330) Pulse Rate:  [75-81] 76 (08/13 0330) Cardiac Rhythm: Normal sinus rhythm (08/12 1900) Resp:  [18] 18 (08/13 0330) BP: (108-133)/(66-80) 108/73 (08/13 0330) SpO2:  [93 %-100 %] 100 % (08/13 0330)  Recent Labs  Lab 02/22/18 1109 02/22/18 1644 02/22/18 2027 02/22/18 2136 02/23/18 0610  GLUCAP 128* 94 66* 127* 102*   Recent Labs  Lab 02/18/18 0002 02/19/18 0301 02/20/18 0521 02/21/18 0506 02/23/18 0308  NA 136 138 136 137 139  K 3.2* 4.0 3.6 3.3* 3.8  CL 102 106 104 105 108  CO2 26 23 18* 22 22  GLUCOSE 226* 133* 115* 86 91  BUN 14 10 14 16 15   CREATININE 0.76 0.81 1.03* 0.94 0.88  CALCIUM 8.7* 9.1 9.5 9.3 9.4  MG  --   --   --   --  2.1   Recent Labs  Lab 02/18/18 0002  AST 44*  ALT 30  ALKPHOS 105  BILITOT 0.6  PROT 7.7  ALBUMIN 3.9   Recent Labs  Lab 02/18/18 0002 02/19/18 0301 02/20/18 0521 02/21/18 0506  WBC 5.2 5.0 6.2 4.4  NEUTROABS 2.4  --   --   --   HGB 12.0 12.3 12.6 12.0  HCT 35.6 37.6 39.1 37.2  MCV 85.6 86.6 87.3 86.7  PLT 209 201 213 202   Recent Labs  Lab 02/18/18 0002  TROPONINI <0.03        Component Value Date/Time   CHOL 243 (H) 02/19/2018 0301   CHOL 211 (H) 06/22/2014 1432   TRIG 349 (H) 02/19/2018 0301   TRIG 473 (H) 06/22/2014 1432   HDL 37 (L) 02/19/2018 0301   HDL 42 06/22/2014 1432   CHOLHDL 6.6 02/19/2018 0301   VLDL 70 (H) 02/19/2018 0301   VLDL SEE COMMENT 06/22/2014 1432   LDLCALC 136 (H) 02/19/2018 0301   LDLCALC SEE COMMENT 06/22/2014 1432   Lab Results  Component Value Date   HGBA1C 9.1 (H) 02/19/2018      Component Value Date/Time   LABOPIA NONE DETECTED 02/18/2018 0003   COCAINSCRNUR NONE DETECTED 02/18/2018 0003   LABBENZ TEST NOT PERFORMED, REAGENT NOT  AVAILABLE (A) 02/18/2018 0003   AMPHETMU NONE DETECTED 02/18/2018 0003   THCU NONE DETECTED 02/18/2018 0003   LABBARB NONE DETECTED 02/18/2018 0003    Recent Labs  Lab 02/18/18 0002  ETH <10   IMAGING Ct Head Code Stroke Wo Contrast 02/18/2018 IMPRESSION:  1. No hemorrhage or mass effect.  2. ASPECTS is 10.   Ct Angio Head W Or Wo Contrast Ct Angio Neck W Or Wo Contrast 02/18/2018 IMPRESSION:  No emergent large vessel occlusion. Normal CTA of the head and neck   Mr Brain Wo Contrast 02/18/2018 IMPRESSION:  1. Acute small vessel infarct of the right basal ganglia, in close proximity to the right internal capsule, in keeping with reported left-sided weakness.  2. No hemorrhage or mass effect.   TTE - Left ventricle: The cavity size was normal. Wall thickness was increased in a pattern of mild LVH. Systolic function was normal. The estimated ejection fraction was in the range of 55% to 60%. Wall motion was normal; there were no regional wall motion abnormalities. Doppler parameters are consistent with abnormal left ventricular relaxation (grade  1 diastolic dysfunction). Impressions: - No cardiac source of emboli was indentified.   PHYSICAL EXAM General - Well nourished, well developed, in no apparent distress.  Cardiovascular - Regular rate and rhythm.  Mental Status -  Level of arousal and orientation to time, place, and person were intact. Language including expression, naming, repetition, comprehension was assessed and found intact. Fund of Knowledge was assessed and was intact.  Cranial Nerves II - XII - II - Visual field intact OU. III, IV, VI - Extraocular movements intact. V - Facial sensation intact bilaterally. VII - mild left facial droop. Mild OS corner ptosis VIII - Hearing & vestibular intact bilaterally. X - Palate elevates symmetrically, mild dysarthria. XI - Chin turning & shoulder shrug intact bilaterally. XII - Tongue protrusion intact.  Motor Strength -  The patient's strength was normal in RUE and RLE, however, LUE 4+/5 proximal and 3/5 distal and LLE 5-/5 and pronator drift was absent.  Bulk was normal and fasciculations were absent.   Motor Tone - Muscle tone was assessed at the neck and appendages and was normal.  Reflexes - The patient's reflexes were symmetrical in all extremities and she had no pathological reflexes.  Sensory - Light touch, temperature/pinprick were assessed and were symmetrical.    Coordination - The patient had normal movements in the right hand with no ataxia or dysmetria.  Tremor was absent.  Gait and Station - deferred.   ASSESSMENT/PLAN [name] is a 62 y.o. female with history of DM, HLD, ulcerative colitis admitted for left facial droop and left hemiparesis. tPA given. Infarct due to Small vessel disease. Has vascular risk factors of HTN. HLD and DB. Found to have HIV during stroke workup. ID consulted. Started on biktarvy. Serum crptococcal neg. Considering LP if CD4>200. Stroke plans treatment with aspirin and plavix, now on aspirin but plavix on hold under after LP performed if needed. Still with L HP requiring inpatient rehab. Awaiting transfer there.   Stroke:  right small BG infarct likely secondary to small vessel disease source  Resultant left facial droop and left hemiparesis  MRI right small BG infarct  CTA head and neck unremarkable  2D Echo EF 55-60%  LDL 136  HgbA1c 9.1  UDS negative  SCDs for VTE prophylaxis  No antithrombotic prior to admission, now on ASA 81. Plavix on hold now in case pt needs LP.   Patient counseled to be compliant with her antithrombotic medications  Therapy recommendations:  CIR  Disposition:  Pending CIR. Medically ready once bed available and insurance approval obtained.   Diabetes  HgbA1c 9.1 goal < 7.0  Uncontrolled  home glipizide decreased earlier to 30m bid after one hypoglycemia episode.   Hypoglycemia again yesterday - down to  647  DB RN following  Will stop all home diabetic meds  Continue CBG monitoring  SSI  DM education and close PCP follow up  Hypertension Stable, normal range  Long term BP goal normotensive  Hyperlipidemia  Home meds:  none   LDL 136, goal < 70  On lipitor 40  Continue statin at discharge  Migraine   Hx of migraine   HA for the last 2 days  On fioricet and tylenol PRN  On topamax 534mQhs, will titrate to 5050mid in 5 days (02/25/18).   Consider to follow up with Dr. AheJaynee Eagles outpt  HIV positive  New diagnosis - do not mention it to other family members as per pt request  HIV positive  on serum testing x 2  ID on board  Viral load 5.0 and CD4 500  RPR, HBV and HCV negative  Serum crypto Ag - negative - planned LP if CD4 < 200, not needed  Other Stroke Risk Factors  High TG - on lipitor  Other Active Problems  Ulcerative colitis  Hypokalemia - supplement -> 3.8  mg level normal  Elevated creatinine,resolved 1.03->0.94->0.[facility] day # 5  Burnetta Sabin, MSN, APRN, ANVP-BC, AGPCNP-BC Advanced Practice Stroke Nurse [place] [facility] for Schedule & Pager information 02/23/2018 12:26 PM    To contact Stroke Continuity provider, please refer to [email or web address] After hours, contact General Neurology

## 2018-02-23 NOTE — Care Management Note (Signed)
Case Management Note  Patient Details  Name: [name] MRN: [id number] Date of Birth: 01-30-1956  Subjective/Objective:                    Action/Plan: Pt discharging to CIR today. CM signing off.   Expected Discharge Date:  02/23/18               Expected Discharge Plan:  [place]  In-House Referral:     Discharge planning Services  CM Consult  Post Acute Care Choice:    Choice offered to:     DME Arranged:    DME Agency:     HH Arranged:    HH Agency:     Status of Service:  In process, will continue to follow  If discussed at Long Length of Stay Meetings, dates discussed:    Additional Comments:  [name], RN 02/23/2018, 3:58 PM

## 2018-02-23 NOTE — H&P (Signed)
Physical Medicine and Rehabilitation Admission H&P    : HPI: [name] is a 62 year old right-handed female with history of asthma, diabetes mellitus, ulcerative colitis.  Lives with spouse independent prior to admission.  Daughter can provide assistance during the day and husband at night.  Presented 02/18/2018 to [facility] with left-sided weakness and slurred speech.  Cranial CT scan with no hemorrhage or mass-effect.  Patient did receive TPA.  CT angiogram of head and neck with no emergent large vessel occlusion.  MRI showed small vessel infarction of the right basal ganglia and close proximity to the right internal capsule.  Echocardiogram with ejection fraction of 74% grade 1 diastolic dysfunction.  Neurology consulted maintained on aspirin and Plavix x3 weeks then aspirin alone.  Tolerating a regular diet.  As part of patient's admission for CVA she was tested for HIV disease which was positive.  Infectious disease consulted and plan HIV treatment with CD4 28.  Physical and occupational therapy evaluations completed patient was admitted for a comprehensive rehab program.  Review of Systems  Constitutional: Negative for chills and fever.  HENT: Negative for hearing loss.   Eyes: Negative for blurred vision and double vision.  Respiratory: Negative for cough and shortness of breath.   Cardiovascular: Positive for leg swelling. Negative for palpitations.  Gastrointestinal: Positive for constipation. Negative for nausea and vomiting.  Genitourinary: Negative for dysuria, flank pain and hematuria.  Musculoskeletal: Positive for myalgias.  Skin: Negative for rash.  Neurological: Positive for speech change and focal weakness.  All other systems reviewed and are negative.      Past Medical History:  Diagnosis Date  . Asthma   . Diabetes mellitus without complication ([place]) 2-59-56   hx. NIDDM-dx. 3 weeks ago  . Hyperlipidemia    hx  . Seasonal  allergies   . Ulcerative colitis [facility])         Past Surgical History:  Procedure Laterality Date  . BUNIONECTOMY     bilateral and toe nails of big toes removed  . CESAREAN SECTION  1997/2006  . CHOLECYSTECTOMY N/A 12/01/2012   Procedure: LAPAROSCOPIC CHOLECYSTECTOMY;  Surgeon: Stark Klein, MD;  Location: [facility];  Service: General;  Laterality: N/A;  . COLONOSCOPY W/ POLYPECTOMY    . EYE SURGERY Bilateral 2013  . TONSILLECTOMY          Family History  Problem Relation Age of Onset  . Hypertension Mother   . Diabetes Mother   . Cancer Father        lung ca  . Seizures Daughter        65yr   Social History:  reports that she has never smoked. She has never used smokeless tobacco. She reports that she drinks alcohol. She reports that she does not use drugs. Allergies:       Allergies  Allergen Reactions  . Azithromycin Itching    Muscle aches  . Erythromycin Nausea And Vomiting  . Penicillins Nausea And Vomiting    Has patient had a PCN reaction causing immediate rash, facial/tongue/throat swelling, SOB or lightheadedness with hypotension: Yes Has patient had a PCN reaction causing severe rash involving mucus membranes or skin necrosis: No Has patient had a PCN reaction that required hospitalization No Has patient had a PCN reaction occurring within the last 10 years: No If all of the above answers are "NO", then may proceed with Cephalosporin use.   . Tetracycline Nausea And Vomiting  Medications Prior to Admission  Medication Sig Dispense Refill  . albuterol (PROVENTIL HFA;VENTOLIN HFA) 108 (90 Base) MCG/ACT inhaler Inhale 2 puffs into the lungs every 6 (six) hours as needed for wheezing or shortness of breath. 1 Inhaler 0  . glipiZIDE (GLUCOTROL) 10 MG tablet Take 1 tablet (10 mg total) by mouth 2 (two) times daily before a meal. 60 tablet 3  . ondansetron (ZOFRAN) 4 MG tablet Take 1 tablet (4 mg total) by mouth every 8 (eight) hours as  needed. 20 tablet 0  . sertraline (ZOLOFT) 25 MG tablet Take 1 tablet (25 mg total) by mouth at bedtime. (Patient not taking: Reported on 02/18/2018) 30 tablet 2    Drug Regimen Review Drug regimen was reviewed and remains appropriate with no significant issues identified  Home: Home Living Family/patient expects to be discharged to:: Private residence Living Arrangements: Spouse/significant other Available Help at Discharge: Family, Available 24 hours/day Type of Home: House Home Access: Level entry Home Layout: Two level Alternate Level Stairs-Number of Steps: 10 Alternate Level Stairs-Rails: Left(going up) Bathroom Shower/Tub: Gaffer, Door [employer] Toilet: Standard Home Equipment: None   Functional History: Prior Function Level of Independence: Independent  Functional Status:  Mobility: Bed Mobility Overal bed mobility: Needs Assistance Bed Mobility: Supine to Sit, Sit to Supine Supine to sit: Supervision Sit to supine: Supervision General bed mobility comments: supervision for safety, VCs for attention to things in the bed on her left side Transfers Overall transfer level: Needs assistance Equipment used: None Transfers: Sit to/from Stand, Stand Pivot Transfers Sit to Stand: Min guard Stand pivot transfers: Min guard General transfer comment: Min guard, initial posterior instability but no physical assist required Ambulation/Gait Ambulation/Gait assistance: Min guard Gait Distance (Feet): (Hallway ambulation for gait retraining) Assistive device: None Gait Pattern/deviations: Step-through pattern General Gait Details: gait retraining tasks performed with 4 bouts of 100'. Min guard for safety and stability, ocassional cues for environment left side Gait velocity: decreased Gait velocity interpretation: 1.31 - 2.62 ft/sec, indicative of limited community ambulator  ADL: ADL Overall ADL's : Needs assistance/impaired Eating/Feeding: Supervision/ safety,  Set up Eating/Feeding Details (indicate cue type and reason): pt. c/o lip biting on L lower lip while chewing, also concerned with "drooping" appearance of face Grooming: Brushing hair, Standing, Minimal assistance, Cueing for compensatory techniques(applying perfume, donning wig, applying make up) Grooming Details (indicate cue type and reason): occasional LOB to left,  cues to correct, focus on B hand integration. pt. able to stabalize hair gel with L hand and apply pressure to squeeze it into palm of R hand. used both hands to hold make up sponge to apply make up Upper Body Bathing: Minimal assistance Upper Body Bathing Details (indicate cue type and reason): supported sitting Lower Body Bathing: Maximal assistance Lower Body Bathing Details (indicate cue type and reason): min A +2 sit<>stand Upper Body Dressing : Moderate assistance Upper Body Dressing Details (indicate cue type and reason): supported sitting Lower Body Dressing: Maximal assistance Lower Body Dressing Details (indicate cue type and reason): min A sit<>stand Toilet Transfer: Moderate assistance, +2 for safety/equipment, Ambulation, Comfort height toilet, Grab bars Toilet Transfer Details (indicate cue type and reason): Bil HHA Toileting- Clothing Manipulation and Hygiene: Moderate assistance Toileting - Clothing Manipulation Details (indicate cue type and reason): min A +2 sit<>stand Functional mobility during ADLs: Min guard(furniture walks in room without device) General ADL Comments: min a for standing tasks, cues for use of both hands and compensatory techniques.  pt. eager and pleased with  completing tasks on her own.    Cognition: Cognition Overall Cognitive Status: Within Functional Limits for tasks assessed Orientation Level: Oriented X4 Cognition Arousal/Alertness: Awake/alert Behavior During Therapy: WFL for tasks assessed/performed Overall Cognitive Status: Within Functional Limits for tasks  assessed  Physical Exam: Blood pressure 119/74, pulse 85, temperature 98.8 F (37.1 C), temperature source Oral, resp. rate 16, height _0  (1.575 m), weight 67.7 kg, SpO2 93 %. Physical Exam  Vitals reviewed. Constitutional: No distress.  HENT:  Head: Normocephalic and atraumatic.  Eyes: Pupils are equal, round, and reactive to light. Conjunctivae and EOM are normal. Left eye exhibits no discharge.  Neck: Normal range of motion. Neck supple. No tracheal deviation present. No thyromegaly present.  Cardiovascular: Normal rate, regular rhythm and normal heart sounds. Exam reveals no friction rub.  No murmur heard. Respiratory: Effort normal and breath sounds normal. No respiratory distress.  GI: Soft. Bowel sounds are normal. She exhibits no distension.  Musculoskeletal: She exhibits no edema, tenderness or deformity.  Neurological:  Speech slightly dysarthric but intelligible. Left central 7. LUE 2+/5 prox to distal. LLE 2+ to 3+/5 prox to distal. RUE and RLE 4+/5. Sensory decreased to LT in left arm/leg. Fair insight and awareness. Normal language.   Skin: Skin is warm and dry. She is not diaphoretic.  Long finger nails  Psychiatric: She has a normal mood and affect. Her behavior is normal.    LabResultsLast48Hours        Results for orders placed or performed during the hospital encounter of 02/18/18 (from the past 48 hour(s))  RPR     Status: None   Collection Time: 02/21/18  3:39 PM  Result Value Ref Range   RPR Ser Ql Non Reactive Non Reactive    Comment: (NOTE) Performed At: [facility] [address] [place], [place] [id number] Rush Farmer MD [id number]   Glucose, capillary     Status: None   Collection Time: 02/21/18  3:50 PM  Result Value Ref Range   Glucose-Capillary 77 70 - 99 mg/dL   Comment 1 Notify RN    Comment 2 Document in Chart   Glucose, capillary     Status: None   Collection Time: 02/21/18  9:25 PM  Result Value Ref  Range   Glucose-Capillary 84 70 - 99 mg/dL   Comment 1 Notify RN    Comment 2 Document in Chart   RPR     Status: None   Collection Time: 02/22/18  3:28 AM  Result Value Ref Range   RPR Ser Ql Non Reactive Non Reactive    Comment: (NOTE) Performed At: [facility] [place], [place] [id number] Rush Farmer MD [id number]   Glucose, capillary     Status: Abnormal   Collection Time: 02/22/18  3:49 AM  Result Value Ref Range   Glucose-Capillary 61 (L) 70 - 99 mg/dL  Glucose, capillary     Status: Abnormal   Collection Time: 02/22/18  4:34 AM  Result Value Ref Range   Glucose-Capillary 109 (H) 70 - 99 mg/dL  T-helper cells (CD4) count (not at [facility])     Status: Abnormal   Collection Time: 02/22/18  9:17 AM  Result Value Ref Range   CD4 T Cell Abs 500 400 - 2,700 /uL   CD4 % Helper T Cell 28 (L) 33 - 55 %    Comment: Performed at [facility], [place] [address], [place], [place] [address]  Glucose, capillary     Status:  Abnormal   Collection Time: 02/22/18 11:09 AM  Result Value Ref Range   Glucose-Capillary 128 (H) 70 - 99 mg/dL  Glucose, capillary     Status: None   Collection Time: 02/22/18  4:44 PM  Result Value Ref Range   Glucose-Capillary 94 70 - 99 mg/dL  Glucose, capillary     Status: Abnormal   Collection Time: 02/22/18  8:27 PM  Result Value Ref Range   Glucose-Capillary 66 (L) 70 - 99 mg/dL   Comment 1 Notify RN    Comment 2 Document in Chart   Glucose, capillary     Status: Abnormal   Collection Time: 02/22/18  9:36 PM  Result Value Ref Range   Glucose-Capillary 127 (H) 70 - 99 mg/dL   Comment 1 Notify RN    Comment 2 Document in Chart   Basic metabolic panel     Status: None   Collection Time: 02/23/18  3:08 AM  Result Value Ref Range   Sodium 139 135 - 145 mmol/L   Potassium 3.8 3.5 - 5.1 mmol/L   Chloride 108 98 - 111 mmol/L   CO2 22 22 - 32 mmol/L   Glucose, Bld 91 70 -  99 mg/dL   BUN 15 8 - 23 mg/dL   Creatinine, Ser 0.88 0.44 - 1.00 mg/dL   Calcium 9.4 8.9 - 10.3 mg/dL   GFR calc non Af Amer >60 >60 mL/min   GFR calc Af Amer >60 >60 mL/min    Comment: (NOTE) The eGFR has been calculated using the CKD EPI equation. This calculation has not been validated in all clinical situations. eGFR's persistently <60 mL/min signify possible Chronic Kidney Disease.    Anion gap 9 5 - 15    Comment: Performed at [facility] [address] [place],  [address]  Magnesium     Status: None   Collection Time: 02/23/18  3:08 AM  Result Value Ref Range   Magnesium 2.1 1.7 - 2.4 mg/dL    Comment: Performed at [facility] [address], [place], [place] [address]  Glucose, capillary     Status: Abnormal   Collection Time: 02/23/18  6:10 AM  Result Value Ref Range   Glucose-Capillary 102 (H) 70 - 99 mg/dL   Comment 1 Notify RN    Comment 2 Document in Chart   Glucose, capillary     Status: None   Collection Time: 02/23/18 11:36 AM  Result Value Ref Range   Glucose-Capillary 76 70 - 99 mg/dL   Comment 1 Notify RN    Comment 2 Document in Chart      ImagingResults(Last48hours)  No results found.       Medical Problem List and Plan: 1.  Left-sided weakness with slurred speech secondary to right basal ganglia infarction secondary to small vessel disease             -admit to inpatien rehab 2.  DVT Prophylaxis/Anticoagulation: SCDs 3. Pain Management: Topamax 50 mg nightly, Fioricet as needed 4. Mood: Zoloft 25 mg dailly 5. Neuropsych: This patient is capable of making decisions on her own behalf. 6. Skin/Wound Care: Routine skin checks 7. Fluids/Electrolytes/Nutrition: Routine in and outs with follow-up chemistries upon admission 8.  Diabetes mellitus with peripheral neuropathy.  Hemoglobin A1c 9.1.  Glucotrol 5 mg twice daily.               -Check blood sugars before meals and at bedtime.                -  Diabetic teaching             -adjust regimen as indicated 9.  Newly diagnosed HIV.  Follow-up infectious disease             -CD4 count 500             -.Biktarvy 50-200--25 mg daily 10.  Hyperlipidemia.  Lipitor 11.  History of asthma.  Albuterol inhaler as needed  Post Admission Physician Evaluation: 1. Functional deficits secondary  to right basal ganglia infarct. 2. Patient is admitted to receive collaborative, interdisciplinary care between the physiatrist, rehab nursing staff, and therapy team. 3. Patient's level of medical complexity and substantial therapy needs in context of that medical necessity cannot be provided at a lesser intensity of care such as a SNF. 4. Patient has experienced substantial functional loss from his/her baseline which was documented above under the "Functional History" and "Functional Status" headings.  Judging by the patient's diagnosis, physical exam, and functional history, the patient has potential for functional progress which will result in measurable gains while on inpatient rehab.  These gains will be of substantial and practical use upon discharge  in facilitating mobility and self-care at the household level. 5. Physiatrist will provide 24 hour management of medical needs as well as oversight of the therapy plan/treatment and provide guidance as appropriate regarding the interaction of the two. 6. The Preadmission Screening has been reviewed and patient status is unchanged unless otherwise stated above. 7. 24 hour rehab nursing will assist with bladder management, bowel management, safety, skin/wound care, disease management, medication administration, pain management and patient education  and help integrate therapy concepts, techniques,education, etc. 8. PT will assess and treat for/with: Lower extremity strength, range of motion, stamina, balance, functional mobility, safety, adaptive techniques and equipment, NMR, family/pt education, community  reentry.   Goals are: mod I. 9. OT will assess and treat for/with: ADL's, functional mobility, safety, upper extremity strength, adaptive techniques and equipment, NMR, ego support, family/pt ed.   Goals are: mod I. Therapy may proceed with showering this patient. 10. SLP will assess and treat for/with: cognition, communication.  Goals are: mod I. 11. Case Management and Social Worker will assess and treat for psychological issues and discharge planning. 12. Team conference will be held weekly to assess progress toward goals and to determine barriers to discharge. 13. Patient will receive at least 3 hours of therapy per day at least 5 days per week. 14. ELOS: 7-9 days       15. Prognosis:  excellent   I have personally performed a face to face diagnostic evaluation of this patient and formulated the key components of the plan.  Additionally, I have personally reviewed laboratory data, imaging studies, as well as relevant notes and concur with the physician assistant's documentation above.  Meredith Stag[place], MD, FAAPMR       Lavon Paganini [place], PA-C 02/23/2018

## 2018-02-24 ENCOUNTER — Inpatient Hospital Stay (HOSPITAL_COMMUNITY): Payer: Self-pay

## 2018-02-24 ENCOUNTER — Inpatient Hospital Stay (HOSPITAL_COMMUNITY): Payer: BLUE CROSS/BLUE SHIELD | Admitting: Occupational Therapy

## 2018-02-24 DIAGNOSIS — Z21 Asymptomatic human immunodeficiency virus [HIV] infection status: Secondary | ICD-10-CM

## 2018-02-24 LAB — GLUCOSE, CAPILLARY
GLUCOSE-CAPILLARY: 121 mg/dL — AB (ref 70–99)
Glucose-Capillary: 130 mg/dL — ABNORMAL HIGH (ref 70–99)
Glucose-Capillary: 109 mg/dL — ABNORMAL HIGH (ref 70–99)
Glucose-Capillary: 102 mg/dL — ABNORMAL HIGH (ref 70–99)

## 2018-02-24 NOTE — Evaluation (Addendum)
Physical Therapy Assessment and Plan  Patient Details  Name: [name] MRN: [id number] Date of Birth: [birth year]  PT Diagnosis: Abnormality of gait, Cognitive deficits and Hemiparesis non-dominant Rehab Potential: Good ELOS: 5-7   Today's Date: 02/24/2018 PT Individual Time: 0800-0900, 60 min  -       Problem List:  Patient Active Problem List   Diagnosis Date Noted  . Essential hypertension 02/23/2018  . Migraine 02/23/2018  . Infarction of right basal ganglia ([place]) 02/23/2018  . Hypokalemia   . HIV disease ([place])   . 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 HLD (hyperlipidemia) 08/02/2014  . Type 2 diabetes mellitus ([place]) 07/31/2014  . Ulcerative colitis ([place]) 06/26/2008    Past Medical History:  Past Medical History:  Diagnosis Date  . Asthma   . Diabetes mellitus without complication ([place]) 1-54-00   hx. NIDDM-dx. 3 weeks ago  . Hyperlipidemia    hx  . Seasonal allergies   . Ulcerative colitis [facility])    Past Surgical History:  Past Surgical History:  Procedure Laterality Date  . BUNIONECTOMY     bilateral and toe nails of big toes removed  . CESAREAN SECTION  1997/2006  . CHOLECYSTECTOMY N/A 12/01/2012   Procedure: LAPAROSCOPIC CHOLECYSTECTOMY;  Surgeon: Stark Klein, MD;  Location: [facility];  Service: General;  Laterality: N/A;  . COLONOSCOPY W/ POLYPECTOMY    . EYE SURGERY Bilateral 2013  . TONSILLECTOMY      Assessment & Plan Clinical Impression:  [name] is a 62 year old right-handed female with history of asthma, diabetes mellitus, ulcerative colitis. Lives with spouse independent prior to admission. Daughter can provide assistance during the day and husband at night. Presented 02/18/2018 to [facility] with left-sided weakness and slurred speech. Cranial CT scan with no hemorrhage or mass-effect. Patient did receive TPA. CT angiogram of head and neck with no emergent large vessel  occlusion. MRI showed small vessel infarction of the right basal ganglia and close proximity to the right internal capsule. Echocardiogram with ejection fraction of 86% grade 1 diastolic dysfunction. Neurology consulted maintained on aspirin and Plavix x3 weeks then aspirin alone. Tolerating a regular diet. As part of patient's admission for CVA she was tested for HIV disease which was positive. Infectious disease consulted and plan HIV treatment with CD4 28.  Patient transferred to CIR on 02/23/2018 .   Patient currently requires min with mobility secondary to muscle joint tightness, decreased cardiorespiratoy endurance, impaired timing and sequencing, unbalanced muscle activation and decreased coordination, decreased visual motor skills, decreased awareness, decreased safety awareness and decreased memory and decreased sitting balance, decreased standing balance, hemiplegia and decreased balance strategies.  Prior to hospitalization, patient was independent  with mobility and lived with Daughter, Significant other, Family in a House home.  Home access is  Level entry. She has 10 steps to 2nd floor where there is a full bath.  Patient will benefit from skilled PT intervention to maximize safe functional mobility, minimize fall risk and decrease caregiver burden for planned discharge home with 24 hour supervision.  Anticipate patient will benefit from follow up OP at discharge.  PT - End of Session Activity Tolerance: Tolerates 10 - 20 min activity with multiple rests Endurance Deficit: Yes Endurance Deficit Description: multiple rest breaks secondary to fatigue PT Assessment Rehab Potential (ACUTE/IP ONLY): Good PT Patient demonstrates impairments in the following area(s): Balance;Behavior;Endurance;Motor;Sensory;Safety PT Transfers Functional Problem(s): Bed Mobility;Bed to Chair;Car;Furniture;Floor PT Locomotion Functional Problem(s): Ambulation;Wheelchair Mobility;Stairs  PT Plan PT  Intensity: Minimum of 1-2 x/day ,45 to 90 minutes PT Frequency: 5 out of 7 days PT Duration Estimated Length of Stay: 5-7 PT Treatment/Interventions: Ambulation/gait training;Community reintegration;DME/adaptive equipment instruction;Neuromuscular re-education;Psychosocial support;Stair training;UE/LE Strength taining/ROM;Wheelchair propulsion/positioning;Balance/vestibular training;Discharge planning;Functional electrical stimulation;Pain management;Therapeutic Activities;UE/LE Coordination activities;Cognitive remediation/compensation;Functional mobility training;Patient/family education;Splinting/orthotics;Therapeutic Exercise;Visual/perceptual remediation/compensation PT Transfers Anticipated Outcome(s): modified independent basic , supervision car and floor PT Locomotion Anticipated Outcome(s): supervision gait x 150' in distracting controlled and x 200' in community env, modified independent gait in quiet home env x 50', supervision up/down 10 steps 1 rail for access to 2nd floor of her house PT Recommendation Follow Up Recommendations: Outpatient PT Patient destination: Home Equipment Recommended: To be determined  Skilled Therapeutic Intervention  Pt participated well in eval.  She is very motivated and wants to be as independent as possible.  At end of session, she demonstrated poor judgement by repeated questioning regarding why she should get assistance in order to get OOB or out of her w/c.  With urging, she promised to use call bell.  Seat alarm set and all needs left at hand.  PT notified LeeAnn and requested a seat belt alarm be used.  Pt left resting in w/c.  PT Evaluation Precautions/Restrictions- fall   Pain Pain Assessment Pain Score: 0-No pain Home Living/Prior Functioning Home Living Available Help at Discharge: Family;Available 24 hours/day(daughter days/husband nights) Type of Home: House Home Access: Level entry Home Layout: Two level;1/2 bath on main  level Alternate Level Stairs-Number of Steps: 10 Alternate Level Stairs-Rails: Left(going up) Bathroom Shower/Tub: Walk-in shower;Door [employer] Toilet: Standard  Lives With: Daughter;Significant other;Family Prior Function Level of Independence: Independent with homemaking with ambulation  Able to Take Stairs?: Yes Driving: Yes Vocation: Retired Leisure: Hobbies-yes (Comment) Comments: sings gospel at church Vision/Perception pt wears contacts; she reports vision is at baseline, but demonstrates difficulties with L tracking; to be further assessed    Cognition Overall Cognitive Status: Impaired/Different from baseline Arousal/Alertness: Awake/alert(suspect depressed) Orientation Level: Oriented X4 Attention: Selective;Sustained Sustained Attention: Appears intact Selective Attention: Impaired Selective Attention Impairment: Functional complex;Verbal complex Memory: Impaired Memory Impairment: Retrieval deficit;Decreased recall of new information Awareness: Impaired Awareness Impairment: Emergent impairment Problem Solving: Impaired Problem Solving Impairment: Functional complex;Verbal complex Safety/Judgment: Impaired- pt asked about getting up by herself Sensation Sensation Light Touch: Impaired Detail Central sensation comments: diminished LLE Light Touch Impaired Details: Impaired LLE Proprioception: Impaired Detail Proprioception Impaired Details: Impaired LLE(accurate 2/5 movements great toe) Coordination Heel Shin Test: mildly limited excursion and speed LLE Motor  Motor Motor: Within Functional Limits Motor - Skilled Clinical Observations: generalized weakness  Mobility Bed Mobility Bed Mobility: Rolling Right;Rolling Left;Right Sidelying to Sit Rolling Right: Supervision/verbal cueing Rolling Left: Supervision/Verbal cueing Right Sidelying to Sit: Supervision/Verbal cueing Transfers Transfers: Stand Pivot Transfers Stand Pivot Transfers: Minimal Assistance -  Patient > 75% Stand Pivot Transfer Details: Manual facilitation for weight shifting;Verbal cues for safe use of DME/AE;Verbal cues for technique Transfer (Assistive device): None Locomotion  Gait Ambulation: Yes Gait Assistance: Minimal Assistance - Patient > 75% Gait Distance (Feet): 80 Feet Assistive device: None Gait Gait: Yes Gait Pattern: Impaired Gait Pattern: Decreased trunk rotation;Trunk rotated posteriorly on left;Narrow base of support;Left flexed knee in stance;Decreased hip/knee flexion - left Gait velocity: decreased Stairs / Additional Locomotion Stairs: No Wheelchair Mobility Wheelchair Mobility: Yes Wheelchair Assistance: Minimal assistance - Patient >75% Wheelchair Propulsion: Right lower extremity;Right upper extremity Wheelchair Parts Management: Needs assistance Distance: 50  Trunk/Postural Assessment  Cervical Assessment Cervical Assessment: Within Functional Limits  Thoracic Assessment Thoracic Assessment: Within Functional Limits Lumbar Assessment Lumbar Assessment: Within Functional Limits Postural Control Postural Control: Within Functional Limits  Balance Balance Balance Assessed: Yes Static Sitting Balance Static Sitting - Level of Assistance: 5: Stand by assistance Dynamic Sitting Balance Dynamic Sitting - Level of Assistance: 4: Min assist Static Standing Balance Static Standing - Level of Assistance: 4: Min assist Dynamic Standing Balance Dynamic Standing - Level of Assistance: 4: Min assist Extremity Assessment      RLE Assessment RLE Assessment: Within Functional Limits Passive Range of Motion (PROM) Comments: tight heel cord LLE Assessment LLE Assessment: Exceptions to [facility] Passive Range of Motion (PROM) Comments: tight heel cord General Strength Comments: grossly in sitting: hip flexion (sartorius) 3-/5, knee extension 3-/5, ankle DF 3-/5   See Function Navigator for Current Functional Status.   Refer to Care Plan for Long Term  Goals  Recommendations for other services: None   Discharge Criteria: Patient will be discharged from PT if patient refuses treatment 3 consecutive times without medical reason, if treatment goals not met, if there is a change in medical status, if patient makes no progress towards goals or if patient is discharged from hospital.  The above assessment, treatment plan, treatment alternatives and goals were discussed and mutually agreed upon: by patient  Rudolph Dobler 02/24/2018, 5:03 PM

## 2018-02-24 NOTE — Progress Notes (Signed)
PMR Admission Coordinator Pre-Admission Assessment  Patient: [name] is an 62 y.o., female MRN: [id number] DOB: 1956-06-29 Height: 5' 2"  (157.5 cm) Weight: 67.7 kg                                                                                                                                                  Insurance Information HMO:     PPO: X    PCP:        IPA:       80/20:        OTHER :   PRIMARY: [employer]   Policy#: [id number]        Subscriber: Spouse Benefits:   Phone #: [phone]       Name: Reference #[id number] Eff. Date: 07/14/17    Deduct: $3000   Out of Pocket Max: $6000      Life Max: N/A CM Name: Case Manager      Phone#:      Fax#: [phone] Pre-Cert#: [id number] for 02/22/18-03/01/18 with faxed updates due 02/28/18     Employer: Spouse's CIR: 80%/20%      SNF: 80%/20% Outpatient: 80%     Co-Pay: 20% Home Health: 80%      Co-Pay: 20% DME: 80%     Co-Pay: 20% Providers: In-network  SECONDARY: [facility]  Policy#: [id number]        Subscriber: Spouse  Benefits:  Phone #: [phone]  Name: Verified online [email or web address]  Eff. Date: 02/22/17    Deduct:        Out of Pocket Max:         Life Max:    [place] secondary as of 07/14/16; No authorization is needed if BCBS approves.      Emergency Contact Information         Contact Information    Name Relation Home Work [place] Daughter [phone]     Mcraney,Christopher Son [phone]  [phone]     Current Medical History  Patient Admitting Diagnosis: Right basal ganglia infarct  History of Present Illness: [name] is a 62 year old right-handed female with history of asthma, diabetes mellitus, ulcerative colitis. Lives with spouse independent prior to admission. Daughter can provide assistance during the day and husband at night. Presented 02/18/2018 to [facility] with left-sided  weakness and slurred speech. Cranial CT scan with no hemorrhage or mass-effect. Patient did receive TPA. CT angiogram of head and neck with no emergent large vessel occlusion. MRI showed small vessel infarction of the right basal ganglia and close proximity to the right internal capsule. Echocardiogram with ejection fraction of 16% grade 1 diastolic dysfunction. Neurology consulted maintained on aspirin and Plavix x3 weeks then aspirin alone. Tolerating a regular diet. As part of patient's admission for CVA she was tested for HIV disease which was positive. Infectious  disease consulted and plan HIV treatment with CD4 28. Physical and occupational therapy evaluations completed patient was admitted for a comprehensive rehab program 02/23/18.  Complete NIHSS TOTAL: 3  Past Medical History      Past Medical History:  Diagnosis Date  . Asthma   . Diabetes mellitus without complication ([place]) 8-34-19   hx. NIDDM-dx. 3 weeks ago  . Hyperlipidemia    hx  . Seasonal allergies   . Ulcerative colitis ([place])     Family History  family history includes Cancer in her father; Diabetes in her mother; Hypertension in her mother; Seizures in her daughter.  Prior Rehab/Hospitalizations:  Has the patient had major surgery during 100 days prior to admission? No  Current Medications   Current Facility-Administered Medications:  .  acetaminophen (TYLENOL) tablet 650 mg, 650 mg, Oral, Q6H PRN, 650 mg at 02/20/18 0123 **OR** [DISCONTINUED] acetaminophen (TYLENOL) solution 650 mg, 650 mg, Per Tube, Q4H PRN **OR** [DISCONTINUED] acetaminophen (TYLENOL) suppository 650 mg, 650 mg, Rectal, Q4H PRN, Aroor, Karena Addison R, MD .  albuterol (PROVENTIL) (2.5 MG/3ML) 0.083% nebulizer solution 3 mL, 3 mL, Inhalation, Q6H PRN, Aroor, Karena Addison R, MD .  aspirin EC tablet 81 mg, 81 mg, Oral, Daily, Rosalin Hawking, MD, 81 mg at 02/23/18 0938 .  atorvastatin (LIPITOR) tablet 40 mg, 40 mg, Oral, q1800, Rosalin Hawking,  MD, 40 mg at 02/22/18 1736 .  bictegravir-emtricitabine-tenofovir AF (BIKTARVY) 50-200-25 MG per tablet 1 tablet, 1 tablet, Oral, Daily, Hatcher, Doroteo Bradford, MD .  butalbital-acetaminophen-caffeine (FIORICET, ESGIC) [phone] MG per tablet 1 tablet, 1 tablet, Oral, Q8H PRN, Rosalin Hawking, MD, 1 tablet at 02/20/18 [phone] .  insulin aspart (novoLOG) injection 0-9 Units, 0-9 Units, Subcutaneous, TID AC & HS, Rosalin Hawking, MD, 1 Units at 02/22/18 1240 .  senna-docusate (Senokot-S) tablet 1 tablet, 1 tablet, Oral, QHS PRN, Aroor, Karena Addison R, MD .  sertraline (ZOLOFT) tablet 25 mg, 25 mg, Oral, QHS, Rosalin Hawking, MD, 25 mg at 02/22/18 2205 .  topiramate (TOPAMAX) tablet 50 mg, 50 mg, Oral, QHS, Rosalin Hawking, MD, 50 mg at 02/22/18 2200  Patients Current Diet:     Diet Order                  Diet heart healthy/carb modified Room service appropriate? Yes; Fluid consistency: Thin  Diet effective now               Precautions / Restrictions Precautions Precautions: Fall Restrictions Weight Bearing Restrictions: No   Has the patient had 2 or more falls or a fall with injury in the past year?No  Prior Activity Level Community (5-7x/wk): Prior to [place] patient was fully independent.  Lives at home with spouse and goddaughter.   Home Assistive Devices / Equipment Home Assistive Devices/Equipment: CBG Meter Home Equipment: None  Prior Device Use: Indicate devices/aids used by the patient prior to current illness, exacerbation or injury? None of the above  Prior Functional Level Prior Function Level of Independence: Independent  Self Care: Did the patient need help bathing, dressing, using the toilet or eating? Independent  Indoor Mobility: Did the patient need assistance with walking from room to room (with or without device)? Independent  Stairs: Did the patient need assistance with internal or external stairs (with or without device)? Independent  Functional Cognition:  Did the patient need help planning regular tasks such as shopping or remembering to take medications? Independent  Current Functional Level Cognition  Overall Cognitive Status: Within Functional Limits for tasks assessed Orientation Level:  Oriented X4    Extremity Assessment (includes Sensation/Coordination)  Upper Extremity Assessment: LUE deficits/detail LUE Deficits / Details: some isolated movement shoulder all the way to hand with diminishing movement as get more distal, increased tone intermittently LUE Coordination: decreased fine motor, decreased gross motor  Lower Extremity Assessment: LLE deficits/detail LLE Deficits / Details: pt able to isolate movement with some difficulty.  grossly >3/5 LLE Coordination: decreased fine motor    ADLs  Overall ADL's : Needs assistance/impaired Eating/Feeding: Supervision/ safety, Set up Eating/Feeding Details (indicate cue type and reason): pt. c/o lip biting on L lower lip while chewing, also concerned with "drooping" appearance of face Grooming: Brushing hair, Standing, Minimal assistance, Cueing for compensatory techniques(applying perfume, donning wig, applying make up) Grooming Details (indicate cue type and reason): occasional LOB to left,  cues to correct, focus on B hand integration. pt. able to stabalize hair gel with L hand and apply pressure to squeeze it into palm of R hand. used both hands to hold make up sponge to apply make up Upper Body Bathing: Minimal assistance Upper Body Bathing Details (indicate cue type and reason): supported sitting Lower Body Bathing: Maximal assistance Lower Body Bathing Details (indicate cue type and reason): min A +2 sit<>stand Upper Body Dressing : Moderate assistance Upper Body Dressing Details (indicate cue type and reason): supported sitting Lower Body Dressing: Maximal assistance Lower Body Dressing Details (indicate cue type and reason): min A sit<>stand Toilet Transfer: Moderate  assistance, +2 for safety/equipment, Ambulation, Comfort height toilet, Grab bars Toilet Transfer Details (indicate cue type and reason): Bil HHA Toileting- Clothing Manipulation and Hygiene: Moderate assistance Toileting - Clothing Manipulation Details (indicate cue type and reason): min A +2 sit<>stand Functional mobility during ADLs: Min guard(furniture walks in room without device) General ADL Comments: min a for standing tasks, cues for use of both hands and compensatory techniques.  pt. eager and pleased with completing tasks on her own.      Mobility  Overal bed mobility: Needs Assistance Bed Mobility: Supine to Sit, Sit to Supine Supine to sit: Supervision Sit to supine: Supervision General bed mobility comments: supervision for safety, VCs for attention to things in the bed on her left side    Transfers  Overall transfer level: Needs assistance Equipment used: None Transfers: Sit to/from Stand, Stand Pivot Transfers Sit to Stand: Min guard Stand pivot transfers: Min guard General transfer comment: Min guard, initial posterior instability but no physical assist required    Ambulation / Gait / Stairs / Wheelchair Mobility  Ambulation/Gait Ambulation/Gait assistance: Counsellor (Feet): (Hallway ambulation for gait retraining) Assistive device: None Gait Pattern/deviations: Step-through pattern General Gait Details: gait retraining tasks performed with 4 bouts of 100'. Min guard for safety and stability, ocassional cues for environment left side Gait velocity: decreased Gait velocity interpretation: 1.31 - 2.62 ft/sec, indicative of limited community ambulator    Posture / Balance Balance Overall balance assessment: Needs assistance Sitting-balance support: No upper extremity supported, Feet supported Sitting balance-Leahy Scale: Good Standing balance support: No upper extremity supported Standing balance-Leahy Scale: Fair Standing balance comment: able  to perform dyanmic tasks and take on challenge in standing High level balance activites: Side stepping, Direction changes, Turns High Level Balance Comments: Patient with some instability, difficulty with start stop on command poor righting reaction time requiring ocassional hands on assist to maintain balance.     Special needs/care consideration BiPAP/CPAP: No CPM: No Continuous Drip IV: No Dialysis: No  Life Vest: No Oxygen: No Special Bed: No Trach Size: No Wound Vac (area): No       Skin: WDL                               Bowel mgmt: Continent, last BM 02/22/18  Bladder mgmt: Continent  Diabetic mgmt: Yes, oral medication and CBG checks 2x/day      Previous Home Environment Living Arrangements: Spouse/significant other Available Help at Discharge: Family, Available 24 hours/day Type of Home: House Home Layout: Two level Alternate Level Stairs-Rails: Left(going up) Alternate Level Stairs-Number of Steps: 10 Home Access: Level entry Bathroom Shower/Tub: Walk-in shower, Charity fundraiser: [place]: No  Discharge Living Setting Plans for Discharge Living Setting: Patient's home, Lives with (comment)(Spouse and goddaughter) Type of Home at Discharge: House Discharge Home Layout: Two level Alternate Level Stairs-Rails: Left Alternate Level Stairs-Number of Steps: 10 Discharge Home Access: Level entry Discharge Bathroom Shower/Tub: Walk-in shower, Door Discharge Bathroom Toilet: Standard Discharge Bathroom Accessibility: Yes How Accessible: Accessible via walker Does the patient have any problems obtaining your medications?: No  Social/Family/Support Systems Patient Roles: Parent, Spouse Contact Information: see above  Anticipated Caregiver: Spouse & Friend Clinical biochemist Information: see above  Ability/Limitations of Caregiver: None Caregiver Availability: 24/7 Discharge Plan Discussed with Primary  Caregiver: No(Discussed with patient ) Is Caregiver In Agreement with Plan?: (Patient in agreement with plan; Mod I goals ) Does Caregiver/Family have Issues with Lodging/Transportation while Pt is in Rehab?: No  Goals/Additional Needs Patient/Family Goal for Rehab: PT/OT/SLP: Mod I  Expected length of stay: 7-10 days  Cultural Considerations: None Dietary Needs: Carb. Mod. and Heart Healthy diet restrictions  Equipment Needs: TBD Pt/Family Agrees to Admission and willing to participate: Yes Program Orientation Provided & Reviewed with Pt/Caregiver Including Roles  & Responsibilities: Yes Additional Information Needs: Pending CD4 may need LP so Plavix held discussed with Linna Hoff and [place] Needs to be Provided By: Team FYI  Barriers to Discharge: Medical stability  Decrease burden of Care through IP rehab admission: No  Possible need for SNF placement upon discharge: No  Patient Condition: This patient's medical and functional status has changed since the consult dated: 02/19/18 in which the Rehabilitation Physician determined and documented that the patient's condition is appropriate for intensive rehabilitative care in an inpatient rehabilitation facility. See "History of Present Illness" (above) for medical update. Functional changes are: Min A-Min guard A with gait in hallway. Patient's medical and functional status update has been discussed with the Rehabilitation physician and patient remains appropriate for inpatient rehabilitation. Will admit to inpatient rehab today.  Preadmission Screen Completed By:  Gunnar Fusi, 02/23/2018 3:46 PM ______________________________________________________________________   Discussed status with Dr. Naaman Plummer on 02/23/18 at 1549 and received telephone approval for admission today.  Admission Coordinator:  Gunnar Fusi, time 1549/Date 02/23/18           Cosigned by: Meredith Staggers, MD at 02/23/2018 4:13 PM  Revision History

## 2018-02-24 NOTE — Progress Notes (Signed)
[place] PHYSICAL MEDICINE & REHABILITATION     PROGRESS NOTE    Subjective/Complaints: Night was uneventful. Denies pain. In good spirits and ready for therapy today  ROS: Patient denies fever, rash, sore throat, blurred vision, nausea, vomiting, diarrhea, cough, shortness of breath or chest pain, joint or back pain, headache, or mood change.   Objective:  No results found. Recent Labs    02/23/18 2112  WBC 4.1  HGB 12.0  HCT 37.3  PLT 218   Recent Labs    02/23/18 0308 02/23/18 2112  NA 139 139  K 3.8 3.5  CL 108 109  GLUCOSE 91 123*  BUN 15 17  CREATININE 0.88 0.95  CALCIUM 9.4 9.5   CBG (last 3)  Recent Labs    02/23/18 2210 02/24/18 0637 02/24/18 1206  GLUCAP 116* 130* 109*    Wt Readings from Last 3 Encounters:  02/18/18 67.7 kg  02/18/18 65.9 kg  12/09/17 68.5 kg     Intake/Output Summary (Last 24 hours) at 02/24/2018 1245 Last data filed at 02/24/2018 0804 Gross per 24 hour  Intake 100 ml  Output -  Net 100 ml    Vital Signs: Blood pressure 111/75, pulse 93, temperature 98.5 F (36.9 C), temperature source Oral, resp. rate 16, height 5' 2"  (1.575 m), SpO2 96 %. Physical Exam:  Constitutional: No distress . Vital signs reviewed. HEENT: EOMI, oral membranes moist Neck: supple Cardiovascular: RRR without murmur. No JVD    Respiratory: CTA Bilaterally without wheezes or rales. Normal effort    GI: BS +, non-tender, non-distended  Musculoskeletal: She exhibits noedema,tendernessor deformity.  Neurological: Speech slightly dysarthric but intelligible. Left central 7. LUE 2+/5 prox to distal. LLE 2+ to 3+/5 prox to distal. RUE and RLE 4+/5. Sensory decreased to LT in left arm/leg. Fair insight and awareness. Language intact Skin: Skin iswarmand dry. She isnot diaphoretic.   Psychiatric: pleasant and cooperativ  Assessment/Plan: 1. Functional deficits secondary to right basal ganglia infarct which require 3+ hours per day of  interdisciplinary therapy in a comprehensive inpatient rehab setting. Physiatrist is providing close team supervision and 24 hour management of active medical problems listed below. Physiatrist and rehab team continue to assess barriers to discharge/monitor patient progress toward functional and medical goals.  Function:  Bathing Bathing position   Position: Shower  Bathing parts Body parts bathed by patient: Right arm, Left arm, Chest, Abdomen, Front perineal area, Buttocks, Right upper leg, Left upper leg, Right lower leg, Left lower leg Body parts bathed by helper: Back  Bathing assist Assist Level: Touching or steadying assistance(Pt > 75%)      Upper Body Dressing/Undressing Upper body dressing   What is the patient wearing?: Bra, Pull over shirt/dress Bra - Perfomed by patient: Thread/unthread right bra strap, Thread/unthread left bra strap Bra - Perfomed by helper: Hook/unhook bra (pull down sports bra) Pull over shirt/dress - Perfomed by patient: Thread/unthread right sleeve, Thread/unthread left sleeve, Put head through opening, Pull shirt over trunk          Upper body assist Assist Level: Touching or steadying assistance(Pt > 75%)      Lower Body Dressing/Undressing Lower body dressing   What is the patient wearing?: Underwear, Pants, Socks, Shoes Underwear - Performed by patient: Thread/unthread right underwear leg, Thread/unthread left underwear leg, Pull underwear up/down   Pants- Performed by patient: Thread/unthread right pants leg, Thread/unthread left pants leg Pants- Performed by helper: Pull pants up/down     Socks - Performed by patient:  Don/doff right sock, Don/doff left sock   Shoes - Performed by patient: Don/doff right shoe, Don/doff left shoe Shoes - Performed by helper: Fasten right, Fasten left          Lower body assist Assist for lower body dressing: Touching or steadying assistance (Pt > 75%)      Toileting Toileting   Toileting steps  completed by patient: Adjust clothing prior to toileting, Performs perineal hygiene, Adjust clothing after toileting      Toileting assist Assist level: Touching or steadying assistance (Pt.75%)   Transfers Chair/bed transfer             Locomotion Ambulation     Max distance: 61' Assist level: Touching or steadying assistance (Pt > 75%)   Wheelchair          Cognition Comprehension Comprehension assist level: Follows complex conversation/direction with extra time/assistive device  Expression Expression assist level: Expresses complex ideas: With extra time/assistive device  Social Interaction Social Interaction assist level: Interacts appropriately with others with medication or extra time (anti-anxiety, antidepressant).  Problem Solving Problem solving assist level: Solves complex 90% of the time/cues < 10% of the time  Memory Memory assist level: Recognizes or recalls 90% of the time/requires cueing < 10% of the time   Medical Problem List and Plan: 1.Left-sided weakness with slurred speechsecondary to right basal ganglia infarction secondary to small vessel disease -beginning therapies today. Very motivated 2. DVT Prophylaxis/Anticoagulation: SCDs 3. Pain Management:Topamax 50 mg nightly, Fioricet as needed 4. Mood:Zoloft 25 mg dailly 5. Neuropsych: This patientiscapable of making decisions on herown behalf. 6. Skin/Wound Care:Routine skin checks 7. Fluids/Electrolytes/Nutrition:encourage PO  -I personally reviewed the patient's labs today.   8.Diabetes mellitus with peripheral neuropathy. Hemoglobin A1c 9.1. Glucotrol 5 mg twice daily.  -sugars under reasonable control at present. -Diabetic teaching -adjust regimen as indicated 9.Newly diagnosed HIV. Follow-up infectious disease as outpt -CD4 count 500 -.Biktarvy50-200--25 mg daily  -keep an eye on  LFT's 10.Hyperlipidemia. Lipitor 11.History of asthma. Albuterol inhaler as needed   LOS (Days) [place] EVALUATION WAS PERFORMED  Meredith Staggers, MD 02/24/2018 12:45 PM

## 2018-02-24 NOTE — Evaluation (Signed)
Occupational Therapy Assessment and Plan  Patient Details  Name: [name] MRN: [id number] Date of Birth: 08-02-1955  OT Diagnosis: cognitive deficits, hemiplegia affecting non-dominant side, muscle weakness (generalized) and coordination disorder Rehab Potential: Rehab Potential (ACUTE ONLY): Good ELOS: 7 days   Today's Date: 02/24/2018 OT Individual Time: 6734-1937 OT Individual Time Calculation (min): 84 min     Problem List:  Patient Active Problem List   Diagnosis Date Noted  . Essential hypertension 02/23/2018  . Migraine 02/23/2018  . Infarction of right basal ganglia ([place]) 02/23/2018  . Hypokalemia   . HIV disease ([place])   . Stroke (cerebrum) ([place]) - R basal ganglia d/t small vessel dz 02/18/2018  . HLD (hyperlipidemia) 08/02/2014  . Type 2 diabetes mellitus ([place]) 07/31/2014  . Ulcerative colitis ([place]) 06/26/2008    Past Medical History:  Past Medical History:  Diagnosis Date  . Asthma   . Diabetes mellitus without complication ([place]) 03-16-39   hx. NIDDM-dx. 3 weeks ago  . Hyperlipidemia    hx  . Seasonal allergies   . Ulcerative colitis [facility])    Past Surgical History:  Past Surgical History:  Procedure Laterality Date  . BUNIONECTOMY     bilateral and toe nails of big toes removed  . CESAREAN SECTION  1997/2006  . CHOLECYSTECTOMY N/A 12/01/2012   Procedure: LAPAROSCOPIC CHOLECYSTECTOMY;  Surgeon: Stark Klein, MD;  Location: [facility] ORS;  Service: General;  Laterality: N/A;  . COLONOSCOPY W/ POLYPECTOMY    . EYE SURGERY Bilateral 2013  . TONSILLECTOMY      Assessment & Plan Clinical Impression: Patient is a 62 y.o. year old female  with history of asthma, diabetes mellitus, ulcerative colitis. Lives with spouse independent prior to admission. Daughter can provide assistance during the day and husband at night. Presented 02/18/2018 to [facility] with left-sided weakness and slurred speech. Cranial CT scan with no hemorrhage or  mass-effect. Patient did receive TPA. CT angiogram of head and neck with no emergent large vessel occlusion. MRI showed small vessel infarction of the right basal ganglia and close proximity to the right internal capsule. Echocardiogram with ejection fraction of 97% grade 1 diastolic dysfunction. Neurology consulted maintained on aspirin and Plavix x3 weeks then aspirin alone. Tolerating a regular diet. As part of patient's admission for CVA she was tested for HIV disease which was positive. Infectious disease consulted and plan HIV treatment with CD4 28. Physical and occupational therapy evaluations completed patient was admitted for a comprehensive rehab program. .  Patient transferred to CIR on 02/23/2018 .    Patient currently requires min with basic self-care skills and IADL secondary to muscle weakness, decreased cardiorespiratoy endurance, decreased coordination and decreased motor planning and decreased sitting balance, decreased standing balance, hemiplegia and decreased balance strategies.  Prior to hospitalization, patient could complete ADLs and self care with independent .  Patient will benefit from skilled intervention to increase independence with basic self-care skills prior to discharge home with care partner.  Anticipate patient will require intermittent supervision and follow up outpatient.  OT - End of Session Activity Tolerance: Decreased this session Endurance Deficit: Yes Endurance Deficit Description: multiple rest breaks secondary to fatigue OT Assessment Rehab Potential (ACUTE ONLY): Good OT Barriers to Discharge: Other (comments) OT Barriers to Discharge Comments: none known at this time OT Patient demonstrates impairments in the following area(s): Balance;Cognition;Endurance;Pain;Safety OT Basic ADL's Functional Problem(s): Grooming;Bathing;Dressing;Toileting OT Advanced ADL's Functional Problem(s): Laundry;Simple Meal Preparation OT Transfers Functional  Problem(s): Toilet;Tub/Shower OT Additional Impairment(s): Fuctional  Use of Upper Extremity OT Plan OT Intensity: Minimum of 1-2 x/day, 45 to 90 minutes OT Duration/Estimated Length of Stay: 7 days OT Treatment/Interventions: Balance/vestibular training;Neuromuscular re-education;Self Care/advanced ADL retraining;Therapeutic Exercise;Cognitive remediation/compensation;DME/adaptive equipment instruction;Pain management;UE/LE Strength taining/ROM;Community reintegration;Patient/family education;UE/LE Coordination activities;Discharge planning;Functional mobility training;Psychosocial support;Therapeutic Activities OT Self Feeding Anticipated Outcome(s): mod I  OT Basic Self-Care Anticipated Outcome(s): mod I  OT Toileting Anticipated Outcome(s): mod I  OT Bathroom Transfers Anticipated Outcome(s): mod I - toilet, Supervision - shower OT Recommendation Recommendations for Other Services: Neuropsych consult Patient destination: Home Follow Up Recommendations: Outpatient OT Equipment Recommended: To be determined   Skilled Therapeutic Intervention Upon entering the room, pt seated in wheelchair awaiting OT arrival. Pt with no c/o pain this session. OT educated pt on OT purpose, POC, and goals with pt verbalizing understanding and agreement. Pt ambulating with steady assist without use of AD into bathroom. Pt seated on TTB for bathing tasks with min A for balance when standing to wash buttocks and peri area. Pt was able to hold onto wash cloth with L UE to wash R UE during session without drops. Pt exiting in same manner to wheelchair for dressing with sit <>stand at sink. Pt required min A for standing balance during LB clothing management. Pt standing at sink for grooming tasks with min verbal cues to utilize LE in functional task. Pt taking seated rest break secondary to fatigue. One handed technique to don B socks and pt required assistance to don B shoes and fasten. Pt remained seated with chair  alarm activated and call bell within reach.   OT Evaluation Precautions/Restrictions  Precautions Precautions: Fall Restrictions Weight Bearing Restrictions: No General   Vital Signs   Pain Pain Assessment Pain Score: 0-No pain Home Living/Prior Functioning Home Living Family/patient expects to be discharged to:: Private residence Living Arrangements: Spouse/significant other Available Help at Discharge: Family, Available 24 hours/day Type of Home: House Home Access: Level entry Home Layout: Two level, 1/2 bath on main level Alternate Level Stairs-Number of Steps: 10 Alternate Level Stairs-Rails: Left Bathroom Shower/Tub: Gaffer, Door [employer] Toilet: Standard  Lives With: Daughter, Significant other, Family Prior Function Level of Independence: Independent with homemaking with ambulation, Independent with basic ADLs, Independent with gait, Independent with transfers  Able to Take Stairs?: Yes Driving: Yes Vocation: Retired Leisure: Hobbies-yes (Comment) Comments: 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 Vision Baseline Vision/History: (contacts bilaterally) Wears Glasses: At all times Patient Visual Report: No change from baseline Cognition Overall Cognitive Status: Within Functional Limits for tasks assessed Arousal/Alertness: Awake/alert Orientation Level: Person;Place;Situation Person: Oriented Place: Oriented Situation: Oriented Year: 2019 Month: August Day of Week: Correct Memory: Impaired Memory Impairment: Retrieval deficit Immediate Memory Recall: Sock;Blue;Bed Memory Recall: Sock;Blue;Bed Memory Recall Sock: Without Cue Memory Recall Blue: Without Cue Memory Recall Bed: Without Cue Awareness: Impaired Safety/Judgment: Appears intact Sensation Sensation Light Touch: Impaired Detail Central sensation comments: dimished LLE and L UE Light Touch Impaired Details: Impaired LLE;Impaired LUE Proprioception: Impaired Detail Proprioception  Impaired Details: Impaired LLE;Impaired LUE Coordination Gross Motor Movements are Fluid and Coordinated: No Fine Motor Movements are Fluid and Coordinated: No Motor  Motor Motor: Within Functional Limits Motor - Skilled Clinical Observations: generalized weakness Mobility  Bed Mobility Bed Mobility: Rolling Right;Rolling Left;Right Sidelying to Sit Rolling Right: Supervision/verbal cueing Rolling Left: Supervision/Verbal cueing Right Sidelying to Sit: Supervision/Verbal cueing  Trunk/Postural Assessment  Cervical Assessment Cervical Assessment: Within Functional Limits Thoracic Assessment Thoracic Assessment: Within Functional Limits Lumbar Assessment Lumbar Assessment: Within Functional Limits Postural  Control Postural Control: Within Functional Limits  Balance Balance Balance Assessed: Yes Static Sitting Balance Static Sitting - Level of Assistance: 5: Stand by assistance Dynamic Sitting Balance Dynamic Sitting - Level of Assistance: 5: Stand by assistance Dynamic Standing Balance Dynamic Standing - Level of Assistance: 4: Min assist Extremity/Trunk Assessment RUE Assessment RUE Assessment: Within Functional Limits LUE Assessment LUE Assessment: Exceptions to [facility] Passive Range of Motion (PROM) Comments: [facility] General Strength Comments: 3-/5 throughout   See Function Navigator for Current Functional Status.   Refer to Care Plan for Long Term Goals  Recommendations for other services: Neuropsych   Discharge Criteria: Patient will be discharged from OT if patient refuses treatment 3 consecutive times without medical reason, if treatment goals not met, if there is a change in medical status, if patient makes no progress towards goals or if patient is discharged from hospital.  The above assessment, treatment plan, treatment alternatives and goals were discussed and mutually agreed upon: by patient  Gypsy Decant 02/24/2018, 12:17 PM

## 2018-02-24 NOTE — Progress Notes (Signed)
Physical Medicine and Rehabilitation Consult Reason for Consult: Left-sided weakness and slurred speech Referring Physician: Dr.Xu   HPI: [name] is a 62 y.o. right-handed female with history of asthma, diabetes mellitus, hyperlipidemia, ulcerative colitis.  Patient lives with spouse.  Independent prior to admission.  Daughter can provide assistance during the day and husband at night.  Presented 02/18/2018 to [facility] with left-sided weakness and slurred speech.  Cranial CT scan with no hemorrhage or mass-effect.  Patient did receive TPA.  CT angiogram of head and neck with no emergent large vessel occlusion.  MRI showed acute small vessel infarction of the right basal ganglia, and close proximity to the right internal capsule.  Echocardiogram with ejection fraction of 99% grade 1 diastolic dysfunction.  Neurology follow-up maintained on aspirin for CVA prophylaxis.  Tolerating a regular diet.  Physical and occupational therapy evaluations completed with recommendations of physical medicine rehab consult.   Review of Systems  Constitutional: Negative for fever.       Negative fever chills  HENT: Negative for hearing loss.   Eyes: Negative for pain.  Respiratory: Negative for sputum production.   Cardiovascular:       Negative chest pain or shortness of breath  Gastrointestinal: Negative for vomiting.  Genitourinary: Negative for urgency.       Negative dysuria hematuria or urgency.  Musculoskeletal: Negative for neck pain.  Neurological: Positive for focal weakness.       Focal weakness and slurred speech.  Negative seizures  Psychiatric/Behavioral: Negative for depression.  All other systems reviewed and are negative.      Past Medical History:  Diagnosis Date  . Asthma   . Diabetes mellitus without complication ([place]) 2[birth year]   hx. NIDDM-dx. 3 weeks ago  . Hyperlipidemia    hx  . Seasonal allergies   . Ulcerative colitis [facility])      Past Surgical History:  Procedure Laterality Date  . BUNIONECTOMY     bilateral and toe nails of big toes removed  . CESAREAN SECTION  1997/2006  . CHOLECYSTECTOMY N/A 12/01/2012   Procedure: LAPAROSCOPIC CHOLECYSTECTOMY;  Surgeon: Stark Klein, MD;  Location: [facility]S;  Service: General;  Laterality: N/A;  . COLONOSCOPY W/ POLYPECTOMY    . EYE SURGERY Bilateral 2013  . TONSILLECTOMY          Family History  Problem Relation Age of Onset  . Hypertension Mother   . Diabetes Mother   . Cancer Father        lung ca  . Seizures Daughter        60yr   Social History:  reports that she has never smoked. She has never used smokeless tobacco. She reports that she drinks alcohol. She reports that she does not use drugs. Allergies:       Allergies  Allergen Reactions  . Azithromycin Itching    Muscle aches  . Erythromycin Nausea And Vomiting  . Penicillins Nausea And Vomiting    Has patient had a PCN reaction causing immediate rash, facial/tongue/throat swelling, SOB or lightheadedness with hypotension: Yes Has patient had a PCN reaction causing severe rash involving mucus membranes or skin necrosis: No Has patient had a PCN reaction that required hospitalization No Has patient had a PCN reaction occurring within the last 10 years: No If all of the above answers are "NO", then may proceed with Cephalosporin use.   . Tetracycline Nausea And Vomiting         Medications Prior to Admission  Medication Sig Dispense Refill  . albuterol (PROVENTIL HFA;VENTOLIN HFA) 108 (90 Base) MCG/ACT inhaler Inhale 2 puffs into the lungs every 6 (six) hours as needed for wheezing or shortness of breath. 1 Inhaler 0  . glipiZIDE (GLUCOTROL) 10 MG tablet Take 1 tablet (10 mg total) by mouth 2 (two) times daily before a meal. 60 tablet 3  . ondansetron (ZOFRAN) 4 MG tablet Take 1 tablet (4 mg total) by mouth every 8 (eight) hours as needed. 20 tablet 0  . sertraline (ZOLOFT) 25 MG  tablet Take 1 tablet (25 mg total) by mouth at bedtime. (Patient not taking: Reported on 02/18/2018) 30 tablet 2    Home: [place] expects to be discharged to:: Private residence Living Arrangements: Spouse/significant other Available Help at Discharge: Family, Available 24 hours/day(daughter days/husband nights) Type of Home: House Home Access: Level entry Home Layout: Two level Alternate Level Stairs-Number of Steps: 10 Alternate Level Stairs-Rails: Left(going up) Bathroom Shower/Tub: Gaffer, Door [employer] Toilet: Standard Home Equipment: None  Functional History: Prior Function Level of Independence: Independent Functional Status:  Mobility: Bed Mobility Overal bed mobility: Needs Assistance Bed Mobility: Supine to Sit Supine to sit: Mod assist General bed mobility comments: VCs for sequence and technique coming up on left side Transfers Overall transfer level: Needs assistance Equipment used: 2 person hand held assist Transfers: Sit to/from Stand, Stand Pivot Transfers Sit to Stand: Min assist, +2 physical assistance Stand pivot transfers: Mod assist, +2 physical assistance Ambulation/Gait Ambulation/Gait assistance: +2 physical assistance, Mod assist Gait Distance (Feet): 15 Feet(to BR then 30 feet with return to the chair.) Gait Pattern/deviations: Step-to pattern, Step-through pattern General Gait Details: paretic gait on the left with difficulty clearing and advancing L LE equal to that of the R LE Gait velocity interpretation: <1.31 ft/sec, indicative of household ambulator  ADL: ADL Overall ADL's : Needs assistance/impaired Eating/Feeding: Supervision/ safety, Set up Eating/Feeding Details (indicate cue type and reason): supported sitting Grooming: Moderate assistance Grooming Details (indicate cue type and reason): supported sitting Upper Body Bathing: Minimal assistance Upper Body Bathing Details (indicate cue type and reason):  supported sitting Lower Body Bathing: Maximal assistance Lower Body Bathing Details (indicate cue type and reason): min A +2 sit<>stand Upper Body Dressing : Moderate assistance Upper Body Dressing Details (indicate cue type and reason): supported sitting Lower Body Dressing: Maximal assistance Lower Body Dressing Details (indicate cue type and reason): min A sit<>stand Toilet Transfer: Moderate assistance, +2 for safety/equipment, Ambulation, Comfort height toilet, Grab bars Toilet Transfer Details (indicate cue type and reason): Bil HHA Toileting- Clothing Manipulation and Hygiene: Moderate assistance Toileting - Clothing Manipulation Details (indicate cue type and reason): min A +2 sit<>stand  Cognition: Cognition Overall Cognitive Status: Within Functional Limits for tasks assessed Orientation Level: Oriented X4 Cognition Arousal/Alertness: Awake/alert Behavior During Therapy: WFL for tasks assessed/performed Overall Cognitive Status: Within Functional Limits for tasks assessed  Blood pressure (!) 136/92, pulse 74, temperature 98.1 F (36.7 C), temperature source Oral, resp. rate 17, height 5' 2"  (1.575 m), weight 67.7 kg, SpO2 98 %. Physical Exam  Vitals reviewed. Constitutional: She appears well-developed.  Well-developed 62 year old right-handed African-American female  HENT:  Head: Atraumatic.  Eyes: Pupils are equal, round, and reactive to light.  Neck: Normal range of motion.  Cardiovascular: Normal rate.  GI: Soft.  Neurological:  Patient is alert.  She identifies her husband sitting at bedside.  Provides her name and age and follow simple commands. Left central 7, speech sl dysarthric.  R upper  and lower 4+ to 5/5. LUE 3+ to 4-/5 prox to distal. LLE 4- to 4/5 prox to distal. Decreased FMC LUE. Senses pain and light touch in all 4. Cognitively intact    LabResultsLast24Hours  Results for orders placed or performed during the hospital encounter of 02/18/18  (from the past 24 hour(s))  MRSA PCR Screening     Status: None   Collection Time: 02/18/18  6:51 AM  Result Value Ref Range   MRSA by PCR NEGATIVE NEGATIVE  Type and screen [place]     Status: None   Collection Time: 02/18/18  9:07 AM  Result Value Ref Range   ABO/RH(D) A POS    Antibody Screen NEG    Sample Expiration      02/21/2018 Performed at [facility], Center [address], [place], [place] [address]   Glucose, capillary     Status: Abnormal   Collection Time: 02/18/18 12:32 PM  Result Value Ref Range   Glucose-Capillary 129 (H) 70 - 99 mg/dL  Glucose, capillary     Status: Abnormal   Collection Time: 02/18/18  4:40 PM  Result Value Ref Range   Glucose-Capillary 143 (H) 70 - 99 mg/dL  Glucose, capillary     Status: Abnormal   Collection Time: 02/18/18  8:00 PM  Result Value Ref Range   Glucose-Capillary 110 (H) 70 - 99 mg/dL  Glucose, capillary     Status: Abnormal   Collection Time: 02/18/18 11:50 PM  Result Value Ref Range   Glucose-Capillary 101 (H) 70 - 99 mg/dL  Hemoglobin A1c     Status: Abnormal   Collection Time: 02/19/18  3:01 AM  Result Value Ref Range   Hgb A1c MFr Bld 9.1 (H) 4.8 - 5.6 %   Mean Plasma Glucose 214.47 mg/dL  Lipid panel     Status: Abnormal   Collection Time: 02/19/18  3:01 AM  Result Value Ref Range   Cholesterol 243 (H) 0 - 200 mg/dL   Triglycerides 349 (H) <150 mg/dL   HDL 37 (L) >40 mg/dL   Total CHOL/HDL Ratio 6.6 RATIO   VLDL 70 (H) 0 - 40 mg/dL   LDL Cholesterol 136 (H) 0 - 99 mg/dL  CBC     Status: None   Collection Time: 02/19/18  3:01 AM  Result Value Ref Range   WBC 5.0 4.0 - 10.5 K/uL   RBC 4.34 3.87 - 5.11 MIL/uL   Hemoglobin 12.3 12.0 - 15.0 g/dL   HCT 37.6 36.0 - 46.0 %   MCV 86.6 78.0 - 100.0 fL   MCH 28.3 26.0 - 34.0 pg   MCHC 32.7 30.0 - 36.0 g/dL   RDW 13.0 11.5 - 15.5 %   Platelets 201 150 - 400 K/uL  Basic metabolic panel     Status: Abnormal     Collection Time: 02/19/18  3:01 AM  Result Value Ref Range   Sodium 138 135 - 145 mmol/L   Potassium 4.0 3.5 - 5.1 mmol/L   Chloride 106 98 - 111 mmol/L   CO2 23 22 - 32 mmol/L   Glucose, Bld 133 (H) 70 - 99 mg/dL   BUN 10 8 - 23 mg/dL   Creatinine, Ser 0.81 0.44 - 1.00 mg/dL   Calcium 9.1 8.9 - 10.3 mg/dL   GFR calc non Af Amer >60 >60 mL/min   GFR calc Af Amer >60 >60 mL/min   Anion gap 9 5 - 15  Glucose, capillary  Status: Abnormal   Collection Time: 02/19/18  4:12 AM  Result Value Ref Range   Glucose-Capillary 133 (H) 70 - 99 mg/dL      ImagingResults(Last48hours)  Ct Angio Head W Or Wo Contrast  Result Date: 02/18/2018 CLINICAL DATA:  Slurred speech and left-sided weakness EXAM: CT ANGIOGRAPHY HEAD TECHNIQUE: Multidetector CT imaging of the head was performed using the standard protocol during bolus administration of intravenous contrast. Multiplanar CT image reconstructions and MIPs were obtained to evaluate the vascular anatomy. CONTRAST:  73m ISOVUE-370 IOPAMIDOL (ISOVUE-370) INJECTION 76% COMPARISON:  Head CT 02/18/2018 FINDINGS: CTA NECK FINDINGS AORTIC ARCH: There is no calcific atherosclerosis of the aortic arch. There is no aneurysm, dissection or hemodynamically significant stenosis of the visualized ascending aorta and aortic arch. Conventional 3 vessel aortic branching pattern. The visualized proximal subclavian arteries are widely patent. RIGHT CAROTID SYSTEM: --Common carotid artery: Widely patent origin without common carotid artery dissection or aneurysm. --Internal carotid artery: No dissection, occlusion or aneurysm. No hemodynamically significant stenosis. --External carotid artery: No acute abnormality. LEFT CAROTID SYSTEM: --Common carotid artery: Widely patent origin without common carotid artery dissection or aneurysm. --Internal carotid artery:No dissection, occlusion or aneurysm. No hemodynamically significant stenosis. --External carotid  artery: No acute abnormality. VERTEBRAL ARTERIES: Codominant configuration. Both origins are normal. No dissection, occlusion or flow-limiting stenosis to the vertebrobasilar confluence. SKELETON: There is no bony spinal canal stenosis. No lytic or blastic lesion. OTHER NECK: Normal pharynx, larynx and major salivary glands. No cervical lymphadenopathy. Unremarkable thyroid gland. UPPER CHEST: No pneumothorax or pleural effusion. No nodules or masses. CTA HEAD FINDINGS ANTERIOR CIRCULATION: --Intracranial internal carotid arteries: Normal. --Anterior cerebral arteries: Normal. Both A1 segments are present. Patent anterior communicating artery. --Middle cerebral arteries: Normal. --Posterior communicating arteries: Present bilaterally. POSTERIOR CIRCULATION: --Basilar artery: Normal. --Posterior cerebral arteries: Normal. --Superior cerebellar arteries: Normal. --Inferior cerebellar arteries: Normal anterior and posterior inferior cerebellar arteries. VENOUS SINUSES: As permitted by contrast timing, patent. ANATOMIC VARIANTS: None DELAYED PHASE: No parenchymal contrast enhancement. Review of the MIP images confirms the above findings. IMPRESSION: No emergent large vessel occlusion. Normal CTA of the head and neck Electronically Signed   By: KUlyses JarredM.D.   On: 02/18/2018 03:21   Ct Angio Neck W Or Wo Contrast  Result Date: 02/18/2018 CLINICAL DATA:  Slurred speech and left-sided weakness EXAM: CT ANGIOGRAPHY HEAD TECHNIQUE: Multidetector CT imaging of the head was performed using the standard protocol during bolus administration of intravenous contrast. Multiplanar CT image reconstructions and MIPs were obtained to evaluate the vascular anatomy. CONTRAST:  773mISOVUE-370 IOPAMIDOL (ISOVUE-370) INJECTION 76% COMPARISON:  Head CT 02/18/2018 FINDINGS: CTA NECK FINDINGS AORTIC ARCH: There is no calcific atherosclerosis of the aortic arch. There is no aneurysm, dissection or hemodynamically significant stenosis  of the visualized ascending aorta and aortic arch. Conventional 3 vessel aortic branching pattern. The visualized proximal subclavian arteries are widely patent. RIGHT CAROTID SYSTEM: --Common carotid artery: Widely patent origin without common carotid artery dissection or aneurysm. --Internal carotid artery: No dissection, occlusion or aneurysm. No hemodynamically significant stenosis. --External carotid artery: No acute abnormality. LEFT CAROTID SYSTEM: --Common carotid artery: Widely patent origin without common carotid artery dissection or aneurysm. --Internal carotid artery:No dissection, occlusion or aneurysm. No hemodynamically significant stenosis. --External carotid artery: No acute abnormality. VERTEBRAL ARTERIES: Codominant configuration. Both origins are normal. No dissection, occlusion or flow-limiting stenosis to the vertebrobasilar confluence. SKELETON: There is no bony spinal canal stenosis. No lytic or blastic lesion. OTHER NECK: Normal pharynx, larynx and major  salivary glands. No cervical lymphadenopathy. Unremarkable thyroid gland. UPPER CHEST: No pneumothorax or pleural effusion. No nodules or masses. CTA HEAD FINDINGS ANTERIOR CIRCULATION: --Intracranial internal carotid arteries: Normal. --Anterior cerebral arteries: Normal. Both A1 segments are present. Patent anterior communicating artery. --Middle cerebral arteries: Normal. --Posterior communicating arteries: Present bilaterally. POSTERIOR CIRCULATION: --Basilar artery: Normal. --Posterior cerebral arteries: Normal. --Superior cerebellar arteries: Normal. --Inferior cerebellar arteries: Normal anterior and posterior inferior cerebellar arteries. VENOUS SINUSES: As permitted by contrast timing, patent. ANATOMIC VARIANTS: None DELAYED PHASE: No parenchymal contrast enhancement. Review of the MIP images confirms the above findings. IMPRESSION: No emergent large vessel occlusion. Normal CTA of the head and neck Electronically Signed   By: Ulyses Jarred M.D.   On: 02/18/2018 03:21   Mr Brain Wo Contrast  Result Date: 02/18/2018 CLINICAL DATA:  Stroke follow-up.  Slurred speech. EXAM: MRI HEAD WITHOUT CONTRAST TECHNIQUE: Multiplanar, multiecho pulse sequences of the brain and surrounding structures were obtained without intravenous contrast. COMPARISON:  CTA head neck 02/18/2018 FINDINGS: BRAIN: Small acute infarct of the right basal ganglia along the medial aspect of the globus pallidus. No hemorrhage or mass effect. This is adjacent to the right internal capsule. The midline structures are normal. There are no old infarcts. Minimal white matter hyperintensity, nonspecific and commonly seen in asymptomatic patients of this age. The CSF spaces are normal for age, with no hydrocephalus. Susceptibility-sensitive sequences show no chronic microhemorrhage or superficial siderosis. VASCULAR: Major intracranial arterial and venous sinus flow voids are preserved. SKULL AND UPPER CERVICAL SPINE: The visualized skull base, calvarium, upper cervical spine and extracranial soft tissues are normal. SINUSES/ORBITS: No fluid levels or advanced mucosal thickening. No mastoid or middle ear effusion. The orbits are normal. IMPRESSION: 1. Acute small vessel infarct of the right basal ganglia, in close proximity to the right internal capsule, in keeping with reported left-sided weakness. 2. No hemorrhage or mass effect. Electronically Signed   By: Ulyses Jarred M.D.   On: 02/18/2018 23:49   Ct Head Code Stroke Wo Contrast  Result Date: 02/18/2018 CLINICAL DATA:  Code stroke. Sudden onset left-sided weakness and slurred speech. EXAM: CT HEAD WITHOUT CONTRAST TECHNIQUE: Contiguous axial images were obtained from the base of the skull through the vertex without intravenous contrast. COMPARISON:  None. FINDINGS: Brain: There is no mass, hemorrhage or extra-axial collection. The size and configuration of the ventricles and extra-axial CSF spaces are normal. There is no  acute or chronic infarction. The brain parenchyma is normal. Vascular: No abnormal hyperdensity of the major intracranial arteries or dural venous sinuses. No intracranial atherosclerosis. Skull: The visualized skull base, calvarium and extracranial soft tissues are normal. Sinuses/Orbits: No fluid levels or advanced mucosal thickening of the visualized paranasal sinuses. No mastoid or middle ear effusion. The orbits are normal. ASPECTS [facility] Stroke Program Early CT Score) - Ganglionic level infarction (caudate, lentiform nuclei, internal capsule, insula, M1-M3 cortex): 7 - Supraganglionic infarction (M4-M6 cortex): 3 Total score (0-10 with 10 being normal): 10 IMPRESSION: 1. No hemorrhage or mass effect. 2. ASPECTS is 10. These results were relayed by telephone at the time of interpretation on 02/18/2018 at 12:15 am to Dr. Marjean Donna via the patient's nurse. Electronically Signed   By: Ulyses Jarred M.D.   On: 02/18/2018 00:18      Assessment/Plan: Diagnosis: right basal ganglia infarct 1. Does the need for close, 24 hr/day medical supervision in concert with the patient's rehab needs make it unreasonable for this patient to be served in a less  intensive setting? Yes 2. Co-Morbidities requiring supervision/potential complications: DM2, hx of UC, post-stroke sequelae 3. Due to bladder management, bowel management, safety, skin/wound care, disease management, medication administration, pain management and patient education, does the patient require 24 hr/day rehab nursing? Yes 4. Does the patient require coordinated care of a physician, rehab nurse, PT (1-2 hrs/day, 5 days/week), OT (1-2 hrs/day, 5 days/week) and SLP (1-2 hrs/day, 5 days/week) to address physical and functional deficits in the context of the above medical diagnosis(es)? Yes Addressing deficits in the following areas: balance, endurance, locomotion, strength, transferring, bowel/bladder control, bathing, dressing, feeding, grooming,  toileting, cognition, speech and psychosocial support 5. Can the patient actively participate in an intensive therapy program of at least 3 hrs of therapy per day at least 5 days per week? Yes 6. The potential for patient to make measurable gains while on inpatient rehab is excellent 7. Anticipated functional outcomes upon discharge from inpatient rehab are modified independent  with PT, modified independent with OT, modified independent with SLP. 8. Estimated rehab length of stay to reach the above functional goals is: 7-10 days 9. Anticipated D/C setting: Home 10. Anticipated post D/C treatments: HH therapy and Outpatient therapy 11. Overall Rehab/Functional Prognosis: excellent  RECOMMENDATIONS: This patient's condition is appropriate for continued rehabilitative care in the following setting: CIR Patient has agreed to participate in recommended program. Yes Note that insurance prior authorization may be required for reimbursement for recommended care.  Comment: Pt is motivated. Has flight of stairs to bedroom/shower at home. Will do well in intensive rehab setting. Rehab Admissions Coordinator to follow up.  Thanks,  Meredith Staggers, MD, Mellody Drown  I have personally performed a face to face diagnostic evaluation of this patient. Additionally, I have reviewed and concur with the physician assistant's documentation above.    Lavon Paganini Angiulli, PA-C 02/19/2018        Revision History                        Routing History

## 2018-02-24 NOTE — Progress Notes (Signed)
   INFECTIOUS DISEASE PROGRESS NOTE  ID: [name] is a 62 y.o. female with  Active Problems:   Hypokalemia   Infarction of right basal ganglia (HCC)  Subjective: Awakens easily from sleep No complaints Asked generically how she is doing with new medication as family was in room.   Abtx:  Anti-infectives (From admission, onward)   Start     Dose/Rate Route Frequency Ordered Stop   02/24/18 0800  bictegravir-emtricitabine-tenofovir AF (BIKTARVY) 50-200-25 MG per tablet 1 tablet     1 tablet Oral Daily 02/23/18 1924        Medications:  Scheduled: . aspirin EC  81 mg Oral Daily  . atorvastatin  40 mg Oral q1800  . bictegravir-emtricitabine-tenofovir AF  1 tablet Oral Daily  . insulin aspart  0-9 Units Subcutaneous TID AC & HS  . sertraline  25 mg Oral QHS  . topiramate  50 mg Oral QHS    Objective: Vital signs in last 24 hours: Temp:  [98.2 F (36.8 C)-99 F (37.2 C)] 98.5 F (36.9 C) (08/14 0430) Pulse Rate:  [83-93] 93 (08/14 0430) Resp:  [16-20] 16 (08/14 0430) BP: (97-123)/(72-89) 111/75 (08/14 0430) SpO2:  [96 %-98 %] 96 % (08/14 0430)   General appearance: alert, cooperative and no distress  Lab Results Recent Labs    02/23/18 0308 02/23/18 2112  WBC  --  4.1  HGB  --  12.0  HCT  --  37.3  NA 139 139  K 3.8 3.5  CL 108 109  CO2 22 22  BUN 15 17  CREATININE 0.88 0.95   Liver Panel Recent Labs    02/23/18 2112  PROT 7.5  ALBUMIN 3.5  AST 75*  ALT 99*  ALKPHOS 135*  BILITOT 0.6   Sedimentation Rate No results for input(s): ESRSEDRATE in the last 72 hours. C-Reactive Protein No results for input(s): CRP in the last 72 hours.  Microbiology: Recent Results (from the past 240 hour(s))  MRSA PCR Screening     Status: None   Collection Time: 02/18/18  6:51 AM  Result Value Ref Range Status   MRSA by PCR NEGATIVE NEGATIVE Final    Comment:        The GeneXpert MRSA Assay (FDA approved for NASAL specimens only), is one component of  a comprehensive MRSA colonization surveillance program. It is not intended to diagnose MRSA infection nor to guide or monitor treatment for MRSA infections. Performed at [facility], [place] [address], [place], [place] [address]     Studies/Results: No results found.   Assessment/Plan: CVA Newly dx HIV+  Total days of antibiotics: 2 biktarvy  She is doing well with her new HIV rx She has not disclosed her status to family.  She needs to f/u in ID clinic 1-2 weeks post d/c Available as needed.          Bobby Rumpf MD, FACP Infectious Diseases (pager) [phone] [email or web address] 02/24/2018, 2:48 PM  LOS: 1 day

## 2018-02-24 NOTE — Evaluation (Signed)
Speech Language Pathology Assessment and Plan  Patient Details  Name: [name] MRN: [id number] Date of Birth: 07-Dec-1955  SLP Diagnosis: Cognitive Impairments;Dysarthria;Dysphagia  Rehab Potential: Good ELOS: 7-9 days     Today's Date: 02/24/2018 SLP Individual Time: 1100-1200 SLP Individual Time Calculation (min): 60 min   Problem List:  Patient Active Problem List   Diagnosis Date Noted  . Essential hypertension 02/23/2018  . Migraine 02/23/2018  . Infarction of right basal ganglia ([place]) 02/23/2018  . Hypokalemia   . HIV disease ([place])   . Stroke (cerebrum) ([place]) - R basal ganglia d/t small vessel dz 02/18/2018  . HLD (hyperlipidemia) 08/02/2014  . Type 2 diabetes mellitus ([place]) 07/31/2014  . Ulcerative colitis ([place]) 06/26/2008   Past Medical History:  Past Medical History:  Diagnosis Date  . Asthma   . Diabetes mellitus without complication ([place]) 10-21-79   hx. NIDDM-dx. 3 weeks ago  . Hyperlipidemia    hx  . Seasonal allergies   . Ulcerative colitis [facility])    Past Surgical History:  Past Surgical History:  Procedure Laterality Date  . BUNIONECTOMY     bilateral and toe nails of big toes removed  . CESAREAN SECTION  1997/2006  . CHOLECYSTECTOMY N/A 12/01/2012   Procedure: LAPAROSCOPIC CHOLECYSTECTOMY;  Surgeon: Stark Klein, MD;  Location: [facility]S;  Service: General;  Laterality: N/A;  . COLONOSCOPY W/ POLYPECTOMY    . EYE SURGERY Bilateral 2013  . TONSILLECTOMY      Assessment / Plan / Recommendation Clinical Impression [name] is a 62 year old right-handed female with history of asthma, diabetes mellitus, ulcerative colitis. Lives with spouse independent prior to admission. Daughter can provide assistance during the day and husband at night. Presented 02/18/2018 to [facility] with left-sided weakness and slurred speech. Cranial CT scan with no hemorrhage or mass-effect. Patient did receive TPA. CT angiogram of head  and neck with no emergent large vessel occlusion. MRI showed small vessel infarction of the right basal ganglia and close proximity to the right internal capsule. Echocardiogram with ejection fraction of 19% grade 1 diastolic dysfunction. Neurology consulted maintained on aspirin and Plavix x3 weeks then aspirin alone. Tolerating a regular diet. As part of patient's admission for CVA she was tested for HIV disease which was positive. Infectious disease consulted and plan HIV treatment with CD4 28. Physical and occupational therapy evaluations completed patient was admitted for a comprehensive rehab program.  Pt presents with mild-moderate cognitive impairments, deficits include higher level problem solving, safety/emergent awareness, recall of novel information, left inattention and selective attention. Cognitive linguistic assessment Cognistat, indicated mild impairments in reasoning, problem solving and moderate impairment in short term recall. Pt also demonstrated depressed behaviors, likely impacting performance of cognitive skill set. Pt presents with mild impairment in swallow function during oral stage with atypical mastication pattern, reduced labial seal and left buccal pocketing with regular textured foods, however pt compensated with lingual sweep with intermittent supervision A verbal cues. Pt consumed regular textured foods and thin liquids via straw/cup with no overt s/s aspiration. Pt presents with mild dysarthria in conversation with able to recall intelligibility strategies. Pt would benefit from skilled ST services in order to maximize functional independence and reduce burden of care requiring 24/hours supervision and continung ST services at next level of care upon returning home.    Skilled Therapeutic Interventions          Skilled ST services focused on cognitive skills. SLP facilitated semi-complex problem solving skills utilizing scheduling task, demonstrated  difficulty  organizing/processing information, which was further impacted by overall depressed behaviors. Pt requested to get in bed. SLP facilitated transfer with min physical A and demonstrated ability to follow functional commands. SLP posted intelligibility strategies in room. Pt was left in room with call bell within reach, bed alaram set and friend present.ST reccomends to continue skilled ST services.   SLP Assessment  Patient will need skilled Speech Lanaguage Pathology Services during CIR admission    Recommendations  SLP Diet Recommendations: Thin Liquid Administration via: Straw;Cup Medication Administration: Whole meds with liquid Supervision: Patient able to self feed;Intermittent supervision to cue for compensatory strategies Compensations: Lingual sweep for clearance of pocketing Postural Changes and/or Swallow Maneuvers: Seated upright 90 degrees Oral Care Recommendations: Patient independent with oral care Patient destination: Home Follow up Recommendations: 24 hour supervision/assistance;Home Health SLP;Outpatient SLP Equipment Recommended: None recommended by SLP    SLP Frequency 3 to 5 out of 7 days   SLP Duration  SLP Intensity  SLP Treatment/Interventions 7-9 days   Minumum of 1-2 x/day, 30 to 90 minutes  Cognitive remediation/compensation;Cueing hierarchy;Dysphagia/aspiration precaution training;Functional tasks;Patient/family education;Speech/Language facilitation    Pain Pain Assessment Pain Score: 0-No pain  Prior Functioning Cognitive/Linguistic Baseline: Within functional limits(friends present to confirm baseline ) Type of Home: House  Lives With: Significant other;Family Available Help at Discharge: Family;Available 24 hours/day Vocation: Retired  Function:  Eating Eating     Eating Assist Level: Supervision or verbal cues;Set up assist for   Eating Set Up Assist For: Opening containers;Cutting food       Cognition Comprehension Comprehension  assist level: Follows basic conversation/direction with no assist  Expression   Expression assist level: Expresses complex 90% of the time/cues < 10% of the time  Social Interaction Social Interaction assist level: Interacts appropriately 50 - 74% of the time - May be physically or verbally inappropriate.  Problem Solving Problem solving assist level: Solves basic problems with no assist  Memory Memory assist level: Recognizes or recalls 25 - 49% of the time/requires cueing 50 - 75% of the time   Short Term Goals: Week 1: SLP Short Term Goal 1 (Week 1): STG=LTG due to ELOS  Refer to Care Plan for Long Term Goals  Recommendations for other services: None  and Neuropsych  Discharge Criteria: Patient will be discharged from SLP if patient refuses treatment 3 consecutive times without medical reason, if treatment goals not met, if there is a change in medical status, if patient makes no progress towards goals or if patient is discharged from hospital.  The above assessment, treatment plan, treatment alternatives and goals were discussed and mutually agreed upon: by patient  Carren Blakley  [facility] 02/24/2018, 3:42 PM

## 2018-02-25 ENCOUNTER — Inpatient Hospital Stay (HOSPITAL_COMMUNITY): Payer: Self-pay | Admitting: Physical Therapy

## 2018-02-25 ENCOUNTER — Inpatient Hospital Stay (HOSPITAL_COMMUNITY): Payer: Self-pay | Admitting: Occupational Therapy

## 2018-02-25 ENCOUNTER — Inpatient Hospital Stay (HOSPITAL_COMMUNITY): Payer: Self-pay | Admitting: Speech Pathology

## 2018-02-25 LAB — GLUCOSE, CAPILLARY
GLUCOSE-CAPILLARY: 132 mg/dL — AB (ref 70–99)
Glucose-Capillary: 147 mg/dL — ABNORMAL HIGH (ref 70–99)
Glucose-Capillary: 110 mg/dL — ABNORMAL HIGH (ref 70–99)
Glucose-Capillary: 120 mg/dL — ABNORMAL HIGH (ref 70–99)

## 2018-02-25 NOTE — Progress Notes (Signed)
[facility] PHYSICAL MEDICINE & REHABILITATION     PROGRESS NOTE    Subjective/Complaints: Slept well again. Was tired after therapies yesterday!  ROS: Patient denies fever, rash, sore throat, blurred vision, nausea, vomiting, diarrhea, cough, shortness of breath or chest pain, joint or back pain, headache, or mood change. .   Objective:  No results found. Recent Labs    02/23/18 2112  WBC 4.1  HGB 12.0  HCT 37.3  PLT 218   Recent Labs    02/23/18 0308 02/23/18 2112  NA 139 139  K 3.8 3.5  CL 108 109  GLUCOSE 91 123*  BUN 15 17  CREATININE 0.88 0.95  CALCIUM 9.4 9.5   CBG (last 3)  Recent Labs    02/24/18 1705 02/24/18 2117 02/25/18 0802  GLUCAP 102* 121* 147*    Wt Readings from Last 3 Encounters:  02/18/18 67.7 kg  02/18/18 65.9 kg  12/09/17 68.5 kg     Intake/Output Summary (Last 24 hours) at 02/25/2018 1103 Last data filed at 02/25/2018 0853 Gross per 24 hour  Intake 342 ml  Output -  Net 342 ml    Vital Signs: Blood pressure 108/89, pulse 84, temperature 98.7 F (37.1 C), temperature source Oral, resp. rate 18, height 5' 2"  (1.575 m), SpO2 98 %. Physical Exam:  Constitutional: No distress . Vital signs reviewed. HEENT: EOMI, oral membranes moist Neck: supple Cardiovascular: RRR without murmur. No JVD    Respiratory: CTA Bilaterally without wheezes or rales. Normal effort    GI: BS +, non-tender, non-distended  Musculoskeletal: She exhibits noedema,tendernessor deformity.  Neurological: Speech slightly dysarthric but intelligible. Left central 7. LUE 2+/5 prox to distal. LLE 2+ to 3+/5 prox to distal. RUE and RLE 4+/5. Sensory decreased to LT in left arm/leg. Fair insight and awareness. Language intact Skin: Skin iswarmand dry. She isnot diaphoretic.   Psychiatric: pleasant  Assessment/Plan: 1. Functional deficits secondary to right basal ganglia infarct which require 3+ hours per day of interdisciplinary therapy in a  comprehensive inpatient rehab setting. Physiatrist is providing close team supervision and 24 hour management of active medical problems listed below. Physiatrist and rehab team continue to assess barriers to discharge/monitor patient progress toward functional and medical goals.  Function:  Bathing Bathing position   Position: Shower  Bathing parts Body parts bathed by patient: Right arm, Left arm, Chest, Abdomen, Front perineal area, Buttocks, Right upper leg, Left upper leg, Right lower leg, Left lower leg Body parts bathed by helper: Back  Bathing assist Assist Level: Touching or steadying assistance(Pt > 75%)      Upper Body Dressing/Undressing Upper body dressing   What is the patient wearing?: Bra, Pull over shirt/dress Bra - Perfomed by patient: Thread/unthread right bra strap, Thread/unthread left bra strap Bra - Perfomed by helper: Hook/unhook bra (pull down sports bra) Pull over shirt/dress - Perfomed by patient: Thread/unthread right sleeve, Thread/unthread left sleeve, Put head through opening, Pull shirt over trunk          Upper body assist Assist Level: Touching or steadying assistance(Pt > 75%)      Lower Body Dressing/Undressing Lower body dressing   What is the patient wearing?: Underwear, Pants, Socks, Shoes Underwear - Performed by patient: Thread/unthread right underwear leg, Thread/unthread left underwear leg, Pull underwear up/down   Pants- Performed by patient: Thread/unthread right pants leg, Thread/unthread left pants leg Pants- Performed by helper: Pull pants up/down     Socks - Performed by patient: Don/doff right sock, Don/doff left sock  Shoes - Performed by patient: Don/doff right shoe, Don/doff left shoe Shoes - Performed by helper: Fasten right, Fasten left          Lower body assist Assist for lower body dressing: Touching or steadying assistance (Pt > 75%)      Toileting Toileting   Toileting steps completed by patient: Adjust  clothing prior to toileting, Performs perineal hygiene, Adjust clothing after toileting      Toileting assist Assist level: Touching or steadying assistance (Pt.75%)   Transfers Chair/bed transfer   Chair/bed transfer method: Ambulatory Chair/bed transfer assist level: Touching or steadying assistance (Pt > 75%)       Locomotion Ambulation     Max distance: 15' Assist level: Touching or steadying assistance (Pt > 75%)   Wheelchair          Cognition Comprehension Comprehension assist level: Follows basic conversation/direction with no assist  Expression Expression assist level: Expresses complex 90% of the time/cues < 10% of the time  Social Interaction Social Interaction assist level: Interacts appropriately 50 - 74% of the time - May be physically or verbally inappropriate.  Problem Solving Problem solving assist level: Solves basic problems with no assist  Memory Memory assist level: Recognizes or recalls 25 - 49% of the time/requires cueing 50 - 75% of the time   Medical Problem List and Plan: 1.Left-sided weakness with slurred speechsecondary to right basal ganglia infarction secondary to small vessel disease -continue therapies 2. DVT Prophylaxis/Anticoagulation: SCDs 3. Pain Management:Topamax 50 mg nightly, Fioricet as needed 4. Mood:Zoloft 25 mg dailly 5. Neuropsych: This patientiscapable of making decisions on herown behalf. 6. Skin/Wound Care:Routine skin checks 7. Fluids/Electrolytes/Nutrition:encourage PO  -good intake so far  8.Diabetes mellitus with peripheral neuropathy. Hemoglobin A1c 9.1. Glucotrol 5 mg twice daily.  -sugars under good control 8/15. -Diabetic teaching -adjust regimen as indicated 9.Newly diagnosed HIV. Follow-up infectious disease as outpt -CD4 count 500 -.Biktarvy50-200--25 mg daily and tolerating  -keep an eye on LFT's 10.Hyperlipidemia.  Lipitor 11.History of asthma. Albuterol inhaler as needed   LOS (Days) 2 A [place] EVALUATION WAS PERFORMED  Meredith Staggers, MD 02/25/2018 11:03 AM

## 2018-02-25 NOTE — Progress Notes (Signed)
Occupational Therapy Session Note  Patient Details  Name: Nico Rogness MRN: [id number] Date of Birth: January 30, 1956  Today's Date: 02/25/2018 OT Individual Time: 1515-1600 OT Individual Time Calculation (min): 45 min   Skilled Therapeutic Interventions/Progress Updates: Patient participated in skilled OT as follows:     Upon approach for therapy she was asleep in bed and then completed supine to EOB transfer with S;    Toilet transfer EOB to walk into bathroom with close S and slower pace over incline/decline bathroom threshold (in order to maintain safe mobility/balance)and then back to sititng EOB;     Toileting= S   Patient participated in much left Upper extremeity weight bearing, putty work and other activities to increase left digital and hand grasp and strength.   She stated she has sensation on the dorsal aspect of her hand but none on the palmar aspect  As well, she and her husband were educated on several techniques to increase sensation in left hand (such as touching/rubbing with various textures and 'feeling' the sensation)     Therapy Documentation Precautions:  Precautions Precautions: Fall Restrictions Weight Bearing Restrictions: No  Pain: on dorsal aspect of left hemiparesis hand (applied heat pack; patient denied need for pain meds)   Therapy/Group: Individual Therapy  Alfredia [facility] 02/25/2018, 11:07 PM

## 2018-02-25 NOTE — Progress Notes (Signed)
Occupational Therapy Session Note  Patient Details  Name: [name] MRN: [id number] Date of Birth: Nov 17, 1955  Today's Date: 02/25/2018 OT Individual Time: 1103-1200 OT Individual Time Calculation (min): 57 min   Short Term Goals: Week 1:  OT Short Term Goal 1 (Week 1): STGs=LTGs secondary to estimated short LOS  Skilled Therapeutic Interventions/Progress Updates:    Pt greeted asleep in bed, easy to wake, and agreeable to OT treatment session. Pt came to sitting EOB with increased time and supervision. Pt then ambulated into bathroom with min HHA to transfer onto tub bench. Bathing completed with focus on functional use of L UE. Pt able to grasp wash cloth 25%, but needed some hand over hand to maintain grasp with fatigue. Pt ambulated out of shower in similar fashion. Dressing completed wc at the sink with forced use of L UE to grasp clothing and pull up. Educated pt on hemi-dressing techniques and safety awareness within BADL tasks. L hand NMR with yellow theraputty w/ focus on grip and pinch. Pt returned to room and left seated in recliner with alarm belt on and needs met.   Therapy Documentation Precautions:  Precautions Precautions: Fall Restrictions Weight Bearing Restrictions: No Pain: Pain Assessment Pain Scale: 0-10 Pain Score: 0-No pain  See Function Navigator for Current Functional Status.  Therapy/Group: Individual Therapy  Valma Cava 02/25/2018, 11:20 AM

## 2018-02-25 NOTE — Progress Notes (Signed)
Physical Therapy Session Note  Patient Details  Name: [name] MRN: [id number] Date of Birth: 21-Nov-1955  Today's Date: 02/25/2018 PT Individual Time: 0930-1025 PT Individual Time Calculation (min): 55 min   Short Term Goals: Week 1:  PT Short Term Goal 1 (Week 1): = LTGs due to ELOS  Skilled Therapeutic Interventions/Progress Updates:    pt performs gait in controlled environment with min guard assist throughout unit up to 150'. Pt with trendelenberg on Lt due to Lt hip weakness, cues for upright posture and min guard when fatigued.  Standing balance training with tapping up/down/laterally with min A for balance, cues for attention and to slow down with Lt LE.  Coordination tapping 3 and 4 number sequences with supervision to recall sequence, min A for balance and coordination.  Attempted squat and pick up bean bags with Lt hand, pt with difficulty grasping bean bag.  Seated pt able to grasp and release bean bags with min A for shoulder and elbow control.  Standing kinetron for LE strengthening 4 x 1 minute with cues for posture due to rounded shoulders and Lt trendelenberg. Pt left in bed with needs at hand, alarm set.  Therapy Documentation Precautions:  Precautions Precautions: Fall Restrictions Weight Bearing Restrictions: No Pain: Pain Assessment Pain Scale: 0-10 Pain Score: 0-No pain    Therapy/Group: Individual Therapy  [name] 02/25/2018, 10:26 AM

## 2018-02-25 NOTE — Progress Notes (Signed)
Social Work  Social Work Assessment and Plan  Patient Details  Name: [name] MRN: [id number] Date of Birth: 02-19-1956  Today's Date: 02/26/2018  Problem List:  Patient Active Problem List   Diagnosis Date Noted  . Essential hypertension 02/23/2018  . Migraine 02/23/2018  . Infarction of right basal ganglia ([place]) 02/23/2018  . Hypokalemia   . HIV disease ([place])   . 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 HLD (hyperlipidemia) 08/02/2014  . Type 2 diabetes mellitus ([place]) 07/31/2014  . Ulcerative colitis ([place]) 06/26/2008   Past Medical History:  Past Medical History:  Diagnosis Date  . Asthma   . Diabetes mellitus without complication ([place]) 0-08-67   hx. NIDDM-dx. 3 weeks ago  . Hyperlipidemia    hx  . Seasonal allergies   . Ulcerative colitis [facility])    Past Surgical History:  Past Surgical History:  Procedure Laterality Date  . BUNIONECTOMY     bilateral and toe nails of big toes removed  . CESAREAN SECTION  1997/2006  . CHOLECYSTECTOMY N/A 12/01/2012   Procedure: LAPAROSCOPIC CHOLECYSTECTOMY;  Surgeon: Stark Klein, MD;  Location: [facility];  Service: General;  Laterality: N/A;  . COLONOSCOPY W/ POLYPECTOMY    . EYE SURGERY Bilateral 2013  . TONSILLECTOMY     Social History:  reports that she has never smoked. She has never used smokeless tobacco. She reports that she drank alcohol. She reports that she does not use drugs.  Family / Support Systems Marital Status: Married How Long?: 40 yrs Patient Roles: Spouse, Parent Spouse/Significant Other: spouse, [name] Children: daughter, [name] [facility]) @ (C) [phone];  son, [name], living in [place] Other Supports: friend, Technical brewer Anticipated Caregiver: spouse, daughter (plans to stay at the home) and friend Ability/Limitations of Caregiver: None Caregiver Availability: 24/7 Family Dynamics: Pt describes all family as very supportive.    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   Abuse/Neglect Abuse/Neglect Assessment Can Be Completed: Yes Physical Abuse: Denies Verbal Abuse: Denies Sexual Abuse: Denies Exploitation of patient/patient's resources: Denies Self-Neglect: Denies  Emotional Status Pt's affect, behavior adn adjustment status: Pt lying in bed with spouse at bedside.  Very pleasant and able to complete assessment interview without difficulty.  Pt denies any significant emotional distressed.  Actually notes that she feels "blessed" to have come to the hospital so quickly and able to receive TPA.   Recent Psychosocial Issues: None Pyschiatric History: None Substance Abuse History: None  Patient / Family Perceptions, Expectations & Goals Pt/Family understanding of illness & functional limitations: Pt and spouse with good understanding of her CVA resulting limitations/ need for CIR. Premorbid pt/family roles/activities: Completely independent and acitve at home and in the community. Anticipated changes in roles/activities/participation: Goals set for independent, therefore, do not anticipate much role change needed. Pt/family expectations/goals: Pt hopeful to regain full function in arm/hand  [employer]: None Premorbid Home Care/DME Agencies: None Transportation available at discharge: yes Resource referrals recommended: Support group (specify), Neuropsychology  Discharge Planning Living Arrangements: Spouse/significant other Support Systems: Spouse/significant other, Children, Friends/neighbors Type of Residence: Private residence Insurance Resources: Multimedia programmer (specify)(BCBS and  Building control surveyor) Financial Resources: Family Support Financial Screen Referred: No Living Expenses: Higher education careers adviser Management: Spouse Does the patient have any  problems obtaining your medications?: No Home Management: pt and spouse Patient/Family Preliminary Plans: Pt to d/c home with spouse and daughter able to provide 24/7 supervision/ assistance if needed. Social Work Anticipated Follow Up Needs: HH/OP, Support Group Expected length of stay: 7-10 days   Clinical Impression Very pleasant woman here following CVA.  New 042 diagnosis and will need to discuss community referrals for follow up.  Making excellent progress from CVA and team anticipating independent goals overall.  Family able to provide 24/7 if needed.  Will follow for support and d/c planning needs.  [name] 02/26/2018, 3:57 PM

## 2018-02-25 NOTE — Progress Notes (Signed)
Speech Language Pathology Daily Session Note  Patient Details  Name: [name] MRN: [id number] Date of Birth: 26-Apr-1956  Today's Date: 02/25/2018 SLP Individual Time: 0830-0900 SLP Individual Time Calculation (min): 30 min  Short Term Goals: Week 1: SLP Short Term Goal 1 (Week 1): STG=LTG due to ELOS  Skilled Therapeutic Interventions: Skilled treatment session focused on cognitive goals. Patient was Mod I for recall of her current medications and their function and organized a BID pill box with Mod I and extra time. Patient left upright in bed with alarm on and all needs within reach. Continue with current plan of care.      Function:  Cognition Comprehension Comprehension assist level: Follows basic conversation/direction with no assist  Expression   Expression assist level: Expresses complex ideas: With extra time/assistive device  Social Interaction Social Interaction assist level: Interacts appropriately with others with medication or extra time (anti-anxiety, antidepressant).  Problem Solving Problem solving assist level: Solves complex problems: With extra time  Memory Memory assist level: More than reasonable amount of time    Pain No/Denies Pain   Therapy/Group: Individual Therapy  Alleyne Lac 02/25/2018, 12:53 PM

## 2018-02-26 ENCOUNTER — Inpatient Hospital Stay (HOSPITAL_COMMUNITY): Payer: Self-pay | Admitting: Occupational Therapy

## 2018-02-26 ENCOUNTER — Inpatient Hospital Stay (HOSPITAL_COMMUNITY): Payer: Self-pay | Admitting: Speech Pathology

## 2018-02-26 ENCOUNTER — Inpatient Hospital Stay (HOSPITAL_COMMUNITY): Payer: Self-pay | Admitting: Physical Therapy

## 2018-02-26 LAB — GLUCOSE, CAPILLARY
GLUCOSE-CAPILLARY: 120 mg/dL — AB (ref 70–99)
Glucose-Capillary: 117 mg/dL — ABNORMAL HIGH (ref 70–99)
Glucose-Capillary: 99 mg/dL (ref 70–99)
Glucose-Capillary: 106 mg/dL — ABNORMAL HIGH (ref 70–99)

## 2018-02-26 NOTE — Progress Notes (Signed)
Physical Therapy Session Note  Patient Details  Name: [name] MRN: [id number] Date of Birth: [birth year]  Today's Date: 02/26/2018 PT Individual Time: 0822-0919 PT Individual Time Calculation (min): 57 min   Short Term Goals: Week 1:  PT Short Term Goal 1 (Week 1): = LTGs due to ELOS  Skilled Therapeutic Interventions/Progress Updates:    pt performs gait throughout unit with close supervision, improved gait vs yesterday, decreased trendelenberg and widened BOS.  Gait with obstacle negotiation and tight turns with min guard, cues for attention to Lt LE.  Stair negotiation x 12 stairs with 1 handrail with min A, cues for safety.  Quadruped with alt UE lifts with mod A due to Lt UE and LE weakness.  Quadruped alt LE lifts with min/mod A, cues for trunk stability.  Supine bridge and LTR for core strengthening and control with tactile cues for core activation.  Pt requests Lt UE activity: bean bag grasp and release task with increased time, improved vs yesterday. Standing at bar with focus on Lt LE stance control with Rt LE tapping front/back/sideways with tactile cues for hip and core activation and for upright posture. Pt pleased with progress. Pt left in bed with needs at hand, alarm set.  Therapy Documentation Precautions:  Precautions Precautions: Fall Restrictions Weight Bearing Restrictions: No Pain:  no c/o pain  Therapy/Group: Individual Therapy  Cynthis Purington 02/26/2018, 9:19 AM

## 2018-02-26 NOTE — Care Management (Signed)
Inpatient [facility] Individual Statement of Services  Patient Name:  [name]  Date:  02/26/2018  Welcome to the [facility].  Our goal is to provide you with an individualized program based on your diagnosis and situation, designed to meet your specific needs.  With this comprehensive rehabilitation program, you will be expected to participate in at least 3 hours of rehabilitation therapies Monday-Friday, with modified therapy programming on the weekends.  Your rehabilitation program will include the following services:  Physical Therapy (PT), Occupational Therapy (OT), Speech Therapy (ST), 24 hour per day rehabilitation nursing, Therapeutic Recreaction (TR), Neuropsychology, Case Management (Social Worker), Rehabilitation Medicine, Nutrition Services and Pharmacy Services  Weekly team conferences will be held on Tuesdays to discuss your progress.  Your Social Worker will talk with you frequently to get your input and to update you on team discussions.  Team conferences with you and your family in attendance may also be held.  Expected length of stay: 7 days   Overall anticipated outcome: independent  Depending on your progress and recovery, your program may change. Your Social Worker will coordinate services and will keep you informed of any changes. Your Social Worker's name and contact numbers are listed  below.  The following services may also be recommended but are not provided by the [facility] will be made to provide these services after discharge if needed.  Arrangements include referral to agencies that provide these services.  Your insurance has been verified to be:  Nurse, children's Your primary doctor is:  Webb Silversmith  Pertinent information will be shared with your doctor and your insurance  company.  Social Worker:  [place], [place] or ([phone]   Information discussed with and copy given to patient by: Lennart Pall, 02/26/2018, 3:59 PM

## 2018-02-26 NOTE — Plan of Care (Signed)
  Problem: Consults Goal: RH STROKE PATIENT EDUCATION Description See Patient Education module for education specifics  Outcome: Progressing Goal: Diabetes Guidelines if Diabetic/Glucose > 140 Description If diabetic or lab glucose is > 140 mg/dl - Initiate Diabetes/Hyperglycemia Guidelines & Document Interventions  Outcome: Progressing   Problem: RH SKIN INTEGRITY Goal: RH STG SKIN FREE OF INFECTION/BREAKDOWN Description With min assist   Outcome: Progressing Goal: RH STG MAINTAIN SKIN INTEGRITY WITH ASSISTANCE Description STG Maintain Skin Integrity With min Assistance.  Outcome: Progressing   Problem: RH SAFETY Goal: RH STG ADHERE TO SAFETY PRECAUTIONS W/ASSISTANCE/DEVICE Description STG Adhere to Safety Precautions With min Assistance/Device.  Outcome: Progressing Goal: RH STG DECREASED RISK OF FALL WITH ASSISTANCE Description STG Decreased Risk of Fall With min Assistance.  Outcome: Progressing   Problem: RH COGNITION-NURSING Goal: RH STG ANTICIPATES NEEDS/CALLS FOR ASSIST W/ASSIST/CUES Description STG Anticipates Needs/Calls for Assist With min Assistance/Cues.  Outcome: Progressing   Problem: RH KNOWLEDGE DEFICIT Goal: RH STG INCREASE KNOWLEDGE OF DIABETES Outcome: Progressing Goal: RH STG INCREASE KNOWLEDGE OF HYPERTENSION Outcome: Progressing Goal: RH STG INCREASE KNOWLEGDE OF HYPERLIPIDEMIA Outcome: Progressing Goal: RH STG INCREASE KNOWLEDGE OF STROKE PROPHYLAXIS Outcome: Progressing

## 2018-02-26 NOTE — Progress Notes (Signed)
Occupational Therapy Session Note  Patient Details  Name: [name] MRN: [id number] Date of Birth: 08-Jun-1956  Today's Date: 02/26/2018 OT Individual Time: 1303-1400 OT Individual Time Calculation (min): 57 min   Short Term Goals: Week 1:  OT Short Term Goal 1 (Week 1): STGs=LTGs secondary to estimated short LOS  Skilled Therapeutic Interventions/Progress Updates:    Pt greeted asleep in bed and agreeable to OT treatment session. Pt requests to shower today and gathered clothing with CGA ambulating. Bathing completed with supervision. L UE NMR with weight bearing to wash body parts. Dressing completed seated EOB with supervision overall and verbal cues for safety as pt tried to don pants in standing. Standing grooming tasks with pt able to stabilize toothbrush with L hand today while applying toothpaste. L UE NMR with weight bearing in figure 4 position on therapy mat. Pt able to grasp large cups today with L UE and stack them. Pt returned to room at end of session and left seated in wc with chair alarm on and needs met.   Therapy Documentation Precautions:  Pain: None/denies pain See Function Navigator for Current Functional Status.  Therapy/Group: Individual Therapy  Valma Cava 02/26/2018, 1:29 PM

## 2018-02-26 NOTE — IPOC Note (Signed)
Overall Plan of Care [facility]) Patient Details Name: [name] MRN: [id number] DOB: 1955/12/31  Admitting Diagnosis: <principal problem not specified>right basal ganglia infarct  Hospital Problems: Active Problems:   Hypokalemia   Infarction of right basal ganglia (HCC)     Functional Problem List: Nursing Motor, Safety, Endurance, Medication Management, Sensory  PT Balance, Behavior, Endurance, Motor, Sensory, Safety  OT Balance, Cognition, Endurance, Pain, Safety  SLP Behavior, Safety, Cognition(depressed)  TR         Basic ADL's: OT Grooming, Bathing, Dressing, Toileting     Advanced  ADL's: OT Laundry, Simple Meal Preparation     Transfers: PT Bed Mobility, Bed to Chair, Car, [employer], Futures trader, Metallurgist: PT Ambulation, Emergency planning/management officer, Stairs     Additional Impairments: OT Fuctional Use of Upper Extremity  SLP Swallowing, Communication, Social Cognition expression Problem Solving, Memory, Attention, Awareness  TR      Anticipated Outcomes Item Anticipated Outcome  Self Feeding mod I   Swallowing  Mod I   Basic self-care  mod I   Toileting  mod I    Bathroom Transfers mod I - toilet, Supervision - shower  Bowel/Bladder  continent of bowel and bladder with mod I assist by discharge date  Transfers  modified independent basic , supervision car and floor  Locomotion  modified independent gait x 150', supervision up/down 10 steps 1 rail for access to 2nd floor of her house  Communication  Mod I  Cognition  Min- Supervision A  Pain  <2 out o 10 on  a scale from 1-10 by discharge date  Safety/Judgment  free from fall and/or injury with min assist by discharge date   Therapy Plan: PT Intensity: Minimum of 1-2 x/day ,45 to 90 minutes PT Frequency: 5 out of 7 days PT Duration Estimated Length of Stay: 5-7 OT Intensity: Minimum of 1-2 x/day, 45 to 90 minutes OT Duration/Estimated Length of Stay: 7 days SLP  Intensity: Minumum of 1-2 x/day, 30 to 90 minutes SLP Frequency: 3 to 5 out of 7 days SLP Duration/Estimated Length of Stay: 7-9 days     Team Interventions: Nursing Interventions Patient/Family Education, Medication Management, Discharge Planning, Psychosocial Support, Disease Management/Prevention  PT interventions Ambulation/gait training, Community reintegration, DME/adaptive equipment instruction, Neuromuscular re-education, Psychosocial support, Stair training, UE/LE Strength taining/ROM, Wheelchair propulsion/positioning, Training and development officer, Discharge planning, Functional electrical stimulation, Pain management, Therapeutic Activities, UE/LE Coordination activities, Cognitive remediation/compensation, Functional mobility training, Patient/family education, Splinting/orthotics, Therapeutic Exercise, Visual/perceptual remediation/compensation  OT Interventions Balance/vestibular training, Neuromuscular re-education, Self Care/advanced ADL retraining, Therapeutic Exercise, Cognitive remediation/compensation, DME/adaptive equipment instruction, Pain management, UE/LE Strength taining/ROM, Community reintegration, Barrister's clerk education, UE/LE Coordination activities, Discharge planning, Functional mobility training, Psychosocial support, Therapeutic Activities  SLP Interventions Cognitive remediation/compensation, Cueing hierarchy, Dysphagia/aspiration precaution training, Functional tasks, Patient/family education, Speech/Language facilitation  TR Interventions    SW/CM Interventions Discharge Planning, Psychosocial Support, Patient/Family Education   Barriers to Discharge MD  Medical stability  Nursing      PT      OT Other (comments) none known at this time  SLP      SW       Team Discharge Planning: Destination: PT-Home ,OT- Home , SLP-Home Projected Follow-up: PT-Outpatient PT, OT-  Outpatient OT, SLP-24 hour supervision/assistance, Home Health SLP, Outpatient  SLP Projected Equipment Needs: PT-To be determined, OT- To be determined, SLP-None recommended by SLP Equipment Details: PT- , OT-  Patient/family involved in discharge planning: PT- Patient,  OT-Patient, SLP-Patient  MD  ELOS: 7 days Medical Rehab Prognosis:  Excellent Assessment: The patient has been admitted for CIR therapies with the diagnosis of right basal ganglia infarct. The team will be addressing functional mobility, strength, stamina, balance, safety, adaptive techniques and equipment, self-care, bowel and bladder mgt, patient and caregiver education, NMR, community reentry, cognition, communication. Goals have been set at mod I for mobility, self-care and min assist for cognition/communication.    Meredith Staggers, MD, FAAPMR      See Team Conference Notes for weekly updates to the plan of care

## 2018-02-26 NOTE — Progress Notes (Signed)
Speech Language Pathology Daily Session Notes  Patient Details  Name: [name] MRN: [id number] Date of Birth: 25-May-1956  Today's Date: 02/26/2018  Session 1:SLP Individual Time: 1000-1055 SLP Individual Time Calculation (min): 55 min   Session 2: SLP Individual Time: 3338-3291 SLP Individual Time Calculation (min):  min  Short Term Goals: Week 1: SLP Short Term Goal 1 (Week 1): STG=LTG due to ELOS  Skilled Therapeutic Interventions:  Session 1: Skilled treatment session focused on cognitive goals. Patient completed basic and mildly complex money management tasks with Mod I in a timely manner. Patient with intermittent verbosity, however, suspect that is a baseline personality trait. During a functional conversation, patient required supervision verbal cues for use of a slow rate to maximize speech ineligibility to 100%. Patient left upright in bed with alarm on and all needs within reach. Continue with current plan of care.   Session 2: Skilled treatment session focused on cognitive goals. SLP facilitated session by providing initially providing supervision verbal cues for patient to complete a complex organization/planning task, however, by end of session, patient was Mod I. Patient reported that initially she "went for speed and not accuracy." Patient ambulated to and from her room with Min guard and Min A verbal cues for navigation. Patient left supine in bed with alarm on and all needs within reach. Continue with current plan of care.   Function:  Cognition Comprehension Comprehension assist level: Follows basic conversation/direction with no assist  Expression   Expression assist level: Expresses complex ideas: With extra time/assistive device  Social Interaction Social Interaction assist level: Interacts appropriately with others with medication or extra time (anti-anxiety, antidepressant).  Problem Solving Problem solving assist level: Solves complex problems: With extra  time  Memory Memory assist level: More than reasonable amount of time    Pain Pain Assessment Pain Scale: 0-10 Pain Score: 0-No pain  Therapy/Group: Individual Therapy  [name] 02/26/2018, 11:28 AM

## 2018-02-26 NOTE — Progress Notes (Signed)
[facility] PHYSICAL MEDICINE & REHABILITATION     PROGRESS NOTE    Subjective/Complaints: Pt happy with progress in therapy. No issues overnight  ROS: Patient denies fever, rash, sore throat, blurred vision, nausea, vomiting, diarrhea, cough, shortness of breath or chest pain, joint or back pain, headache, or mood change.  .   Objective:  No results found. Recent Labs    02/23/18 2112  WBC 4.1  HGB 12.0  HCT 37.3  PLT 218   Recent Labs    02/23/18 2112  NA 139  K 3.5  CL 109  GLUCOSE 123*  BUN 17  CREATININE 0.95  CALCIUM 9.5   CBG (last 3)  Recent Labs    02/25/18 1658 02/25/18 2234 02/26/18 0702  GLUCAP 110* 120* 117*    Wt Readings from Last 3 Encounters:  02/25/18 64.1 kg  02/18/18 67.7 kg  02/18/18 65.9 kg     Intake/Output Summary (Last 24 hours) at 02/26/2018 1032 Last data filed at 02/26/2018 0900 Gross per 24 hour  Intake 460 ml  Output -  Net 460 ml    Vital Signs: Blood pressure 109/67, pulse 82, temperature 98.1 F (36.7 C), temperature source Oral, resp. rate 20, height 5' 2"  (1.575 m), weight 64.1 kg, SpO2 98 %. Physical Exam:  Constitutional: No distress . Vital signs reviewed. HEENT: EOMI, oral membranes moist Neck: supple Cardiovascular: RRR without murmur. No JVD    Respiratory: CTA Bilaterally without wheezes or rales. Normal effort    GI: BS +, non-tender, non-distended  Musculoskeletal: She exhibits noedema,tendernessor deformity.  Neurological: Speech slightly dysarthric but intelligible. Left central 7. LUE 2+/5 prox to distal. LLE 2+ to 3+/5 prox to distal. RUE and RLE 4+/5. Sensory decreased to LT in left arm/leg. Fair insight and awareness. Normal language Skin: Skin iswarmand dry. She isnot diaphoretic.   Psychiatric: pleasant and cooperative  Assessment/Plan: 1. Functional deficits secondary to right basal ganglia infarct which require 3+ hours per day of interdisciplinary therapy in a comprehensive  inpatient rehab setting. Physiatrist is providing close team supervision and 24 hour management of active medical problems listed below. Physiatrist and rehab team continue to assess barriers to discharge/monitor patient progress toward functional and medical goals.  Function:  Bathing Bathing position   Position: Shower  Bathing parts Body parts bathed by patient: Right arm, Left arm, Chest, Abdomen, Front perineal area, Buttocks, Right upper leg, Left upper leg, Right lower leg, Left lower leg Body parts bathed by helper: Back  Bathing assist Assist Level: Touching or steadying assistance(Pt > 75%)      Upper Body Dressing/Undressing Upper body dressing   What is the patient wearing?: Pull over shirt/dress Bra - Perfomed by patient: Thread/unthread right bra strap, Thread/unthread left bra strap Bra - Perfomed by helper: Hook/unhook bra (pull down sports bra) Pull over shirt/dress - Perfomed by patient: Thread/unthread right sleeve, Thread/unthread left sleeve, Pull shirt over trunk, Put head through opening          Upper body assist Assist Level: Touching or steadying assistance(Pt > 75%)      Lower Body Dressing/Undressing Lower body dressing   What is the patient wearing?: Underwear, Non-skid slipper socks, Pants Underwear - Performed by patient: Thread/unthread right underwear leg, Thread/unthread left underwear leg, Pull underwear up/down   Pants- Performed by patient: Thread/unthread left pants leg, Pull pants up/down Pants- Performed by helper: Thread/unthread right pants leg Non-skid slipper socks- Performed by patient: Don/doff right sock, Don/doff left sock   Socks - Performed  by patient: Don/doff right sock, Don/doff left sock   Shoes - Performed by patient: Don/doff right shoe, Don/doff left shoe Shoes - Performed by helper: Fasten right, Fasten left          Lower body assist Assist for lower body dressing: Touching or steadying assistance (Pt > 75%)       Toileting Toileting   Toileting steps completed by patient: Adjust clothing prior to toileting, Performs perineal hygiene, Adjust clothing after toileting   Toileting Assistive Devices: Grab bar or rail  Toileting assist Assist level: Supervision or verbal cues   Transfers Chair/bed transfer   Chair/bed transfer method: Ambulatory Chair/bed transfer assist level: Touching or steadying assistance (Pt > 75%)       Locomotion Ambulation     Max distance: 15' Assist level: Touching or steadying assistance (Pt > 75%)   Wheelchair          Cognition Comprehension Comprehension assist level: Follows basic conversation/direction with no assist  Expression Expression assist level: Expresses complex ideas: With extra time/assistive device  Social Interaction Social Interaction assist level: Interacts appropriately with others with medication or extra time (anti-anxiety, antidepressant).  Problem Solving Problem solving assist level: Solves complex problems: With extra time  Memory Memory assist level: More than reasonable amount of time   Medical Problem List and Plan: 1.Left-sided weakness with slurred speechsecondary to right basal ganglia infarction secondary to small vessel disease -continue therapies 2. DVT Prophylaxis/Anticoagulation: SCDs 3. Pain Management:Topamax 50 mg nightly, Fioricet as needed 4. Mood:Zoloft 25 mg dailly 5. Neuropsych: This patientiscapable of making decisions on herown behalf. 6. Skin/Wound Care:Routine skin checks 7. Fluids/Electrolytes/Nutrition:encourage PO  -good po intake  8.Diabetes mellitus with peripheral neuropathy. Hemoglobin A1c 9.1. Glucotrol 5 mg twice daily.  -sugars under good control 8/16 -Diabetic teaching -adjust regimen as indicated 9.Newly diagnosed HIV. Follow-up infectious disease as outpt -CD4 count 500 -.Biktarvy50-200--25 mg daily  and tolerating  -keep an eye on LFT's 10.Hyperlipidemia. Lipitor 11.History of asthma. Albuterol inhaler as needed   LOS (Days) [place] EVALUATION WAS PERFORMED  Meredith Staggers, MD 02/26/2018 10:32 AM

## 2018-02-27 ENCOUNTER — Inpatient Hospital Stay (HOSPITAL_COMMUNITY): Payer: BLUE CROSS/BLUE SHIELD | Admitting: Physical Therapy

## 2018-02-27 ENCOUNTER — Inpatient Hospital Stay (HOSPITAL_COMMUNITY): Payer: Self-pay

## 2018-02-27 DIAGNOSIS — B20 Human immunodeficiency virus [HIV] disease: Secondary | ICD-10-CM

## 2018-02-27 DIAGNOSIS — I639 Cerebral infarction, unspecified: Secondary | ICD-10-CM

## 2018-02-27 DIAGNOSIS — G8114 Spastic hemiplegia affecting left nondominant side: Secondary | ICD-10-CM

## 2018-02-27 LAB — GLUCOSE, CAPILLARY
GLUCOSE-CAPILLARY: 146 mg/dL — AB (ref 70–99)
Glucose-Capillary: 125 mg/dL — ABNORMAL HIGH (ref 70–99)
Glucose-Capillary: 117 mg/dL — ABNORMAL HIGH (ref 70–99)
Glucose-Capillary: 130 mg/dL — ABNORMAL HIGH (ref 70–99)
Glucose-Capillary: 140 mg/dL — ABNORMAL HIGH (ref 70–99)

## 2018-02-27 MED ORDER — SORBITOL 70 % SOLN
30.0000 mL | Freq: Every day | Status: DC | PRN
  Administered 2018-02-27: 30 mL via ORAL
  Filled 2018-02-27: qty 30

## 2018-02-27 MED ORDER — BISACODYL 10 MG RE SUPP: 10.0000 mg | Freq: Every day | RECTAL | Status: DC | PRN

## 2018-02-27 NOTE — Progress Notes (Signed)
Speech Language Pathology Daily Session Note  Patient Details  Name: Jadalee Westcott MRN: [id number] Date of Birth: 10-Sep-1955  Today's Date: 02/27/2018 SLP Individual Time: 3832-9191 SLP Individual Time Calculation (min): 42 min  Short Term Goals: Week 1: SLP Short Term Goal 1 (Week 1): STG=LTG due to ELOS  Skilled Therapeutic Interventions: Skilled treatment session focused on cognitive goals. Patient completed complex visual organization/excutive functioning task involving deductive reasoning with Mod A verbal + visual cues. She also completed mild to moderately complex card game that involved alternating attention and executive functioning skills to include mental flexibility, set shifting, initiation, planning, and decision making given supervision question cues. No verbosity was noted this session. Pt politely declined PO trials of recommended diet consistencies to assess implementation of aspiration precautions + safe swallow strategies, though she explained and recall all recommended strategies with Mod I. Patient left upright in bed with alarm on and all needs within reach. Continue with current plan of care.   Function:  Cognition Comprehension Comprehension assist level: Follows basic conversation/direction with no assist  Expression   Expression assist level: Expresses complex ideas: With extra time/assistive device  Social Interaction Social Interaction assist level: Interacts appropriately with others with medication or extra time (anti-anxiety, antidepressant).  Problem Solving Problem solving assist level: Solves complex problems: With extra time  Memory Memory assist level: More than reasonable amount of time    Pain Pain Assessment Pain Scale: 0-10 Pain Score: 4  Pain Location: Head Pain Intervention(s): RN made aware  Therapy/Group: Individual Therapy  Shailee Foots A Ned Kakar 02/27/2018, 12:00 PM

## 2018-02-27 NOTE — Progress Notes (Signed)
[facility] PHYSICAL MEDICINE & REHABILITATION     PROGRESS NOTE    Subjective/Complaints: Nursing notes no bowel movement in several days.  Patient denies any abdominal pain.  She was walking with therapy this morning  ROS: Patient denies  nausea, vomiting, diarrhea, cough, shortness of breath or chest pain, joint or back pain, occasional headache,   Objective:  No results found. No results for input(s): WBC, HGB, HCT, PLT in the last 72 hours. No results for input(s): NA, K, CL, GLUCOSE, BUN, CREATININE, CALCIUM in the last 72 hours.  Invalid input(s): CO CBG (last 3)  Recent Labs    02/27/18 0640 02/27/18 1148 02/27/18 1155  GLUCAP 125* 117* 130*    Wt Readings from Last 3 Encounters:  02/25/18 64.1 kg  02/18/18 67.7 kg  02/18/18 65.9 kg     Intake/Output Summary (Last 24 hours) at 02/27/2018 1321 Last data filed at 02/27/2018 0800 Gross per 24 hour  Intake 480 ml  Output -  Net 480 ml    Vital Signs: Blood pressure 119/81, pulse 78, temperature 98.2 F (36.8 C), temperature source Oral, resp. rate 17, height 5' 2"  (1.575 m), weight 64.1 kg, SpO2 98 %. Physical Exam:  Constitutional: No distress . Vital signs reviewed. HEENT: EOMI, oral membranes moist Neck: supple Cardiovascular: RRR without murmur. No JVD    Respiratory: CTA Bilaterally without wheezes or rales. Normal effort    GI: BS +, non-tender, non-distended  Musculoskeletal: She exhibits noedema,tendernessor deformity.  Neurological: Speech slightly dysarthric but intelligible. Left central 7. LUE 2+/5 left deltoid, bicep, tricep, grip. LLE 2+ to 3+/5 prox to distal. RUE and RLE 4+/5. Sensory decreased to LT in left arm/leg. Fair insight and awareness. Normal language Skin: Skin iswarmand dry. She isnot diaphoretic.   Psychiatric: pleasant and cooperative  Assessment/Plan: 1. Functional deficits secondary to right basal ganglia infarct which require 3+ hours per day of interdisciplinary  therapy in a comprehensive inpatient rehab setting. Physiatrist is providing close team supervision and 24 hour management of active medical problems listed below. Physiatrist and rehab team continue to assess barriers to discharge/monitor patient progress toward functional and medical goals.  Function:  Bathing Bathing position   Position: Shower  Bathing parts Body parts bathed by patient: Right arm, Left arm, Chest, Abdomen, Front perineal area, Buttocks, Right upper leg, Left upper leg, Right lower leg, Left lower leg, Back Body parts bathed by helper: Back  Bathing assist Assist Level: Supervision or verbal cues      Upper Body Dressing/Undressing Upper body dressing   What is the patient wearing?: Pull over shirt/dress Bra - Perfomed by patient: Thread/unthread right bra strap, Thread/unthread left bra strap Bra - Perfomed by helper: Hook/unhook bra (pull down sports bra) Pull over shirt/dress - Perfomed by patient: Thread/unthread right sleeve, Thread/unthread left sleeve, Pull shirt over trunk, Put head through opening          Upper body assist Assist Level: Supervision or verbal cues      Lower Body Dressing/Undressing Lower body dressing   What is the patient wearing?: Non-skid slipper socks, Pants, Underwear Underwear - Performed by patient: Thread/unthread right underwear leg, Thread/unthread left underwear leg, Pull underwear up/down   Pants- Performed by patient: Thread/unthread right pants leg, Thread/unthread left pants leg, Pull pants up/down Pants- Performed by helper: Thread/unthread right pants leg Non-skid slipper socks- Performed by patient: Don/doff right sock, Don/doff left sock   Socks - Performed by patient: Don/doff right sock, Don/doff left sock   Shoes -  Performed by patient: Don/doff right shoe, Don/doff left shoe Shoes - Performed by helper: Fasten right, Fasten left          Lower body assist Assist for lower body dressing: Supervision or  verbal cues      Toileting Toileting   Toileting steps completed by patient: Adjust clothing prior to toileting, Performs perineal hygiene, Adjust clothing after toileting   Toileting Assistive Devices: Grab bar or rail  Toileting assist Assist level: Supervision or verbal cues   Transfers Chair/bed transfer   Chair/bed transfer method: Ambulatory Chair/bed transfer assist level: Supervision or verbal cues Chair/bed transfer assistive device: Armrests     Locomotion Ambulation     Max distance: 80 Assist level: Touching or steadying assistance (Pt > 75%)(per PT)   Wheelchair          Cognition Comprehension Comprehension assist level: Follows basic conversation/direction with no assist  Expression Expression assist level: Expresses complex ideas: With extra time/assistive device  Social Interaction Social Interaction assist level: Interacts appropriately with others with medication or extra time (anti-anxiety, antidepressant).  Problem Solving Problem solving assist level: Solves complex problems: With extra time  Memory Memory assist level: More than reasonable amount of time   Medical Problem List and Plan: 1.Left-sided weakness with slurred speechsecondary to right basal ganglia infarction secondary to small vessel disease -Continue CIR PT OT speech 2. DVT Prophylaxis/Anticoagulation: SCDs 3. Pain Management:Topamax 50 mg nightly, Fioricet as needed 4. Mood:Zoloft 25 mg dailly 5. Neuropsych: This patientiscapable of making decisions on herown behalf. 6. Skin/Wound Care:Routine skin checks 7. Fluids/Electrolytes/Nutrition:encourage PO  -good po intake  8.Diabetes mellitus with peripheral neuropathy. Hemoglobin A1c 9.1. Glucotrol 5 mg twice daily.  -sugars under good control 8/16 -Diabetic teaching - CBG (last 3)  Recent Labs    02/27/18 0640 02/27/18 1148 02/27/18 1155  GLUCAP 125* 117* 130*   Controlled 02/27/2018 9.Newly diagnosed HIV. Follow-up infectious disease as outpt, afebrile -CD4 count 500 -.Biktarvy50-200--25 mg daily and tolerating  -keep an eye on LFT's 10.Hyperlipidemia. Lipitor 11.History of asthma. Albuterol inhaler as needed   LOS (Days) 4 A FACE TO FACE EVALUATION WAS PERFORMED  Charlett Blake, MD 02/27/2018 1:21 PM

## 2018-02-27 NOTE — Progress Notes (Signed)
Occupational Therapy Session Note  Patient Details  Name: [name] MRN: [id number] Date of Birth: July 29, 1955  Today's Date: 02/27/2018 OT Individual Time: 6184-8592 OT Individual Time Calculation (min): 70 min    Short Term Goals: Week 1:  OT Short Term Goal 1 (Week 1): STGs=LTGs secondary to estimated short LOS  Skilled Therapeutic Interventions/Progress Updates:    Pt supine in bed agreeable to therapy with no c/o pain and requesting shower. Pt completed item retrieval around room, reaching distally into drawers with CGA- (S) level. Pt completed 3/3 toileting tasks with (S). Vc for grab bar use provided during transfer onto TTB in walk in shower. Pt encouraged to use L UE to grasp items in shower, with pt reporting increased sensation and motor control. Pt sat edge of bed and donned UB/LB clothing with set up. Min a required to don head scarf bimanually. Pt completed 100 ft of functional mobility to therapy gym with (S). Pt stood on foam block and completed functional reaching/grasping with L UE for improved [place], as well as to challenge dynamic standing balance. Pt required increased time and effort to grasp with L hand, with vc provided for isolating pincer and lateral pinch. Pt very pleased with progress of L hand! Pt returned to room and was left sitting up in recliner with all needs met.   Therapy Documentation Precautions:  Precautions Precautions: Fall Restrictions Weight Bearing Restrictions: No   Pain: Pain Assessment Pain Scale: 0-10 Pain Score: 0-No pain  See Function Navigator for Current Functional Status.   Therapy/Group: Individual Therapy  Curtis Sites 02/27/2018, 10:46 AM

## 2018-02-27 NOTE — Plan of Care (Signed)
  Problem: Consults Goal: RH STROKE PATIENT EDUCATION Description See Patient Education module for education specifics  Outcome: Progressing Goal: Diabetes Guidelines if Diabetic/Glucose > 140 Description If diabetic or lab glucose is > 140 mg/dl - Initiate Diabetes/Hyperglycemia Guidelines & Document Interventions  Outcome: Progressing   Problem: RH SKIN INTEGRITY Goal: RH STG SKIN FREE OF INFECTION/BREAKDOWN Description With min assist   Outcome: Progressing Goal: RH STG MAINTAIN SKIN INTEGRITY WITH ASSISTANCE Description STG Maintain Skin Integrity With min Assistance.  Outcome: Progressing   Problem: RH SAFETY Goal: RH STG ADHERE TO SAFETY PRECAUTIONS W/ASSISTANCE/DEVICE Description STG Adhere to Safety Precautions With min Assistance/Device.  Outcome: Progressing Goal: RH STG DECREASED RISK OF FALL WITH ASSISTANCE Description STG Decreased Risk of Fall With min Assistance.  Outcome: Progressing   Problem: RH COGNITION-NURSING Goal: RH STG ANTICIPATES NEEDS/CALLS FOR ASSIST W/ASSIST/CUES Description STG Anticipates Needs/Calls for Assist With min Assistance/Cues.  Outcome: Progressing   Problem: RH KNOWLEDGE DEFICIT Goal: RH STG INCREASE KNOWLEDGE OF DIABETES Outcome: Progressing Goal: RH STG INCREASE KNOWLEDGE OF HYPERTENSION Outcome: Progressing Goal: RH STG INCREASE KNOWLEGDE OF HYPERLIPIDEMIA Outcome: Progressing Goal: RH STG INCREASE KNOWLEDGE OF STROKE PROPHYLAXIS Outcome: Progressing

## 2018-02-27 NOTE — Progress Notes (Signed)
Physical Therapy Session Note  Patient Details  Name: [name] MRN: [id number] Date of Birth: [birth year]  Today's Date: 02/27/2018 PT Individual Time: 1300-1415 PT Individual Time Calculation (min): 75 min   Short Term Goals: Week 1:  PT Short Term Goal 1 (Week 1): = LTGs due to ELOS  Skilled Therapeutic Interventions/Progress Updates:  Pt was seen bedside in the pm. Pt performed multiple sit to stand transfers with S. Pt performed multiple stand pivot transfers with S to min guard, increased assistance required as pt fatigued. Pt ambulated multiple distances up to 150 feet without assistive device and min guard with verbal cues. Pt ascended/descended 11 stairs x 2 with 1 rail and min to mod A with verbal cues. Pt rode Nu-step at level 3 for 3 sets x 5 minutes with steps greater than 40. In gym treatment focused on NMR utilizing cone taps and alternating cone taps 3 sets x 10 reps each. Pt returned to room and left sitting up in bed with all needs within reach.   Therapy Documentation Precautions:  Precautions Precautions: Fall Restrictions Weight Bearing Restrictions: No General:   Pain: No c/o pain.   See Function Navigator for Current Functional Status.   Therapy/Group: Individual Therapy  Dub Amis 02/27/2018, 3:13 PM

## 2018-02-28 ENCOUNTER — Inpatient Hospital Stay (HOSPITAL_COMMUNITY): Payer: Self-pay | Admitting: Occupational Therapy

## 2018-02-28 LAB — GLUCOSE, CAPILLARY
GLUCOSE-CAPILLARY: 120 mg/dL — AB (ref 70–99)
GLUCOSE-CAPILLARY: 165 mg/dL — AB (ref 70–99)
Glucose-Capillary: 110 mg/dL — ABNORMAL HIGH (ref 70–99)
Glucose-Capillary: 168 mg/dL — ABNORMAL HIGH (ref 70–99)

## 2018-02-28 MED ORDER — LIVING WELL WITH DIABETES BOOK
Freq: Once | Status: AC
  Administered 2018-02-28: 18:00:00
  Filled 2018-02-28: qty 1

## 2018-02-28 MED ORDER — SENNOSIDES-DOCUSATE SODIUM 8.6-50 MG PO TABS
1.0000 | ORAL_TABLET | Freq: Two times a day (BID) | ORAL | Status: DC
  Administered 2018-02-28 – 2018-03-02 (×5): 1 via ORAL
  Filled 2018-02-28 (×4): qty 1

## 2018-02-28 NOTE — Progress Notes (Signed)
PHYSICAL MEDICINE & REHABILITATION     PROGRESS NOTE    Subjective/Complaints: Bowel movement last night after laxative.  Continent of bladder  ROS: Patient denies  nausea, vomiting, diarrhea, cough, shortness of breath or chest pain, joint or back pain, occasional headache,   Objective:  No results found. No results for input(s): WBC, HGB, HCT, PLT in the last 72 hours. No results for input(s): NA, K, CL, GLUCOSE, BUN, CREATININE, CALCIUM in the last 72 hours.  Invalid input(s): CO CBG (last 3)  Recent Labs    02/27/18 1652 02/27/18 2133 02/28/18 0717  GLUCAP 140* 146* 120*    Wt Readings from Last 3 Encounters:  02/25/18 64.1 kg  02/18/18 67.7 kg  02/18/18 65.9 kg     Intake/Output Summary (Last 24 hours) at 02/28/2018 1133 Last data filed at 02/28/2018 0945 Gross per 24 hour  Intake 720 ml  Output -  Net 720 ml    Vital Signs: Blood pressure 98/72, pulse 82, temperature 98 F (36.7 C), resp. rate 18, height 5' 2"  (1.575 m), weight 64.1 kg, SpO2 95 %. Physical Exam:  Constitutional: No distress . Vital signs reviewed. HEENT: EOMI, oral membranes moist Neck: supple Cardiovascular: RRR without murmur. No JVD    Respiratory: CTA Bilaterally without wheezes or rales. Normal effort    GI: BS +, non-tender, non-distended  Musculoskeletal: She exhibits noedema,tendernessor deformity.  Neurological: Speech slightly dysarthric but intelligible. Left central 7. LUE 2+/5 left deltoid, bicep, tricep, grip. LLE 2+ to 3+/5 prox to distal. RUE and RLE 4+/5. Sensory decreased to LT in left arm/leg. Fair insight and awareness. Normal language Skin: Skin iswarmand dry. She isnot diaphoretic.   Psychiatric: pleasant and cooperative  Assessment/Plan: 1. Functional deficits secondary to right basal ganglia infarct which require 3+ hours per day of interdisciplinary therapy in a comprehensive inpatient rehab setting. Physiatrist is providing close team  supervision and 24 hour management of active medical problems listed below. Physiatrist and rehab team continue to assess barriers to discharge/monitor patient progress toward functional and medical goals.  Function:  Bathing Bathing position   Position: Shower  Bathing parts Body parts bathed by patient: Right arm, Left arm, Chest, Abdomen, Front perineal area, Buttocks, Right upper leg, Left upper leg, Right lower leg, Left lower leg, Back Body parts bathed by helper: Back  Bathing assist Assist Level: Supervision or verbal cues      Upper Body Dressing/Undressing Upper body dressing   What is the patient wearing?: Pull over shirt/dress Bra - Perfomed by patient: Thread/unthread right bra strap, Thread/unthread left bra strap Bra - Perfomed by helper: Hook/unhook bra (pull down sports bra) Pull over shirt/dress - Perfomed by patient: Thread/unthread right sleeve, Thread/unthread left sleeve, Pull shirt over trunk, Put head through opening          Upper body assist Assist Level: Supervision or verbal cues      Lower Body Dressing/Undressing Lower body dressing   What is the patient wearing?: Non-skid slipper socks, Pants, Underwear Underwear - Performed by patient: Thread/unthread right underwear leg, Thread/unthread left underwear leg, Pull underwear up/down   Pants- Performed by patient: Thread/unthread right pants leg, Thread/unthread left pants leg, Pull pants up/down Pants- Performed by helper: Thread/unthread right pants leg Non-skid slipper socks- Performed by patient: Don/doff right sock, Don/doff left sock   Socks - Performed by patient: Don/doff right sock, Don/doff left sock   Shoes - Performed by patient: Don/doff right shoe, Don/doff left shoe Shoes - Performed by helper:  Fasten right, Fasten left          Lower body assist Assist for lower body dressing: Supervision or verbal cues      Toileting Toileting   Toileting steps completed by patient: Adjust  clothing prior to toileting, Performs perineal hygiene, Adjust clothing after toileting   Toileting Assistive Devices: Grab bar or rail  Toileting assist Assist level: Supervision or verbal cues   Transfers Chair/bed transfer   Chair/bed transfer method: Ambulatory Chair/bed transfer assist level: Touching or steadying assistance (Pt > 75%) Chair/bed transfer assistive device: Armrests     Locomotion Ambulation     Max distance: 150 Assist level: Touching or steadying assistance (Pt > 75%)   Wheelchair          Cognition Comprehension Comprehension assist level: Follows basic conversation/direction with no assist  Expression Expression assist level: Expresses complex ideas: With extra time/assistive device  Social Interaction Social Interaction assist level: Interacts appropriately with others with medication or extra time (anti-anxiety, antidepressant).  Problem Solving Problem solving assist level: Solves complex problems: With extra time  Memory Memory assist level: More than reasonable amount of time   Medical Problem List and Plan: 1.Left-sided weakness with slurred speechsecondary to right basal ganglia infarction secondary to small vessel disease -Continue CIR PT OT speech 2. DVT Prophylaxis/Anticoagulation: SCDs 3. Pain Management: Headache improvedTopamax 50 mg nightly, Fioricet as needed 4. Mood:Zoloft 25 mg dailly 5. Neuropsych: This patientiscapable of making decisions on herown behalf. 6. Skin/Wound Care:Routine skin checks 7. Fluids/Electrolytes/Nutrition:encourage PO  -good po intake  8.Diabetes mellitus with peripheral neuropathy. Hemoglobin A1c 9.1. Glucotrol 5 mg twice daily.  -sugars under good control 8/16 -Diabetic teaching - CBG (last 3)  Recent Labs    02/27/18 1652 02/27/18 2133 02/28/18 0717  GLUCAP 140* 146* 120*  Controlled 02/28/2018 9.Newly diagnosed HIV. Follow-up  infectious disease as outpt, afebrile -CD4 count 500 -.Biktarvy50-200--25 mg daily and tolerating  -keep an eye on LFT's 10.Hyperlipidemia. Lipitor 11.History of asthma. Albuterol inhaler as needed  12.  Constipation improved after sorbitol, will start daily dose of Senokot LOS (Days) 5 A FACE TO FACE EVALUATION WAS PERFORMED  Charlett Blake, MD 02/28/2018 11:33 AM

## 2018-02-28 NOTE — Progress Notes (Signed)
Occupational Therapy Session Note  Patient Details  Name: [name] MRN: [id number] Date of Birth: 07-21-1955  Today's Date: 02/28/2018 OT Group Time: 1100-1200 OT Group Time Calculation (min): 60 min   Skilled Therapeutic Interventions/Progress Updates:    Pt participated in therapeutic w/c level dance group with focus on UE/LE strengthening, activity tolerance, and social participation for carryover during self care tasks. Pt was guided through various dance-based exercises involving UB/LB and trunk. Emphasis placed on Lt NMR and dynamic standing balance. She was actively engaged throughout, singing to familiar songs and integrating L UE. Pt also led group in a hand-clapping gospel song when standing with steady assist! At end of session pt was taken back to room with RT.   Therapy Documentation Precautions:  Precautions Precautions: Fall Restrictions Weight Bearing Restrictions: No Pain: No c/o pain during tx  Pain Assessment Pain Scale: 0-10 Pain Score: 0-No pain ADL:       See Function Navigator for Current Functional Status.   Therapy/Group: Group Therapy  Anthony Tamburo A Dalyn Kjos 02/28/2018, 12:26 PM

## 2018-02-28 NOTE — Plan of Care (Signed)
  Problem: Consults Goal: RH STROKE PATIENT EDUCATION Description See Patient Education module for education specifics  Outcome: Progressing Goal: Diabetes Guidelines if Diabetic/Glucose > 140 Description If diabetic or lab glucose is > 140 mg/dl - Initiate Diabetes/Hyperglycemia Guidelines & Document Interventions  Outcome: Progressing   Problem: RH SKIN INTEGRITY Goal: RH STG SKIN FREE OF INFECTION/BREAKDOWN Description With min assist   Outcome: Progressing Goal: RH STG MAINTAIN SKIN INTEGRITY WITH ASSISTANCE Description STG Maintain Skin Integrity With min Assistance.  Outcome: Progressing   Problem: RH SAFETY Goal: RH STG ADHERE TO SAFETY PRECAUTIONS W/ASSISTANCE/DEVICE Description STG Adhere to Safety Precautions With min Assistance/Device.  Outcome: Progressing Goal: RH STG DECREASED RISK OF FALL WITH ASSISTANCE Description STG Decreased Risk of Fall With min Assistance.  Outcome: Progressing   Problem: RH COGNITION-NURSING Goal: RH STG ANTICIPATES NEEDS/CALLS FOR ASSIST W/ASSIST/CUES Description STG Anticipates Needs/Calls for Assist With min Assistance/Cues.  Outcome: Progressing   Problem: RH KNOWLEDGE DEFICIT Goal: RH STG INCREASE KNOWLEDGE OF DIABETES Outcome: Progressing Goal: RH STG INCREASE KNOWLEDGE OF HYPERTENSION Outcome: Progressing Goal: RH STG INCREASE KNOWLEGDE OF HYPERLIPIDEMIA Outcome: Progressing Goal: RH STG INCREASE KNOWLEDGE OF STROKE PROPHYLAXIS Outcome: Progressing

## 2018-03-01 ENCOUNTER — Inpatient Hospital Stay (HOSPITAL_COMMUNITY): Payer: Self-pay | Admitting: Physical Therapy

## 2018-03-01 ENCOUNTER — Inpatient Hospital Stay (HOSPITAL_COMMUNITY): Payer: Self-pay | Admitting: Speech Pathology

## 2018-03-01 ENCOUNTER — Inpatient Hospital Stay (HOSPITAL_COMMUNITY): Payer: Self-pay | Admitting: Occupational Therapy

## 2018-03-01 LAB — GLUCOSE, CAPILLARY
GLUCOSE-CAPILLARY: 159 mg/dL — AB (ref 70–99)
Glucose-Capillary: 126 mg/dL — ABNORMAL HIGH (ref 70–99)
Glucose-Capillary: 139 mg/dL — ABNORMAL HIGH (ref 70–99)
Glucose-Capillary: 129 mg/dL — ABNORMAL HIGH (ref 70–99)

## 2018-03-01 MED ORDER — CLOPIDOGREL BISULFATE 75 MG PO TABS
75.0000 mg | ORAL_TABLET | Freq: Every day | ORAL | Status: DC
  Administered 2018-03-01 – 2018-03-02 (×2): 75 mg via ORAL
  Filled 2018-03-01 (×2): qty 1

## 2018-03-01 MED ORDER — MUSCLE RUB 10-15 % EX CREA
TOPICAL_CREAM | CUTANEOUS | Status: DC | PRN
  Administered 2018-03-01: 17:00:00 via TOPICAL
  Filled 2018-03-01: qty 85

## 2018-03-01 NOTE — Progress Notes (Signed)
Physical Therapy Session Note  Patient Details  Name: [name] MRN: [id number] Date of Birth: 1955-07-17  Today's Date: 03/01/2018 PT Individual Time: 0830-0928 PT Individual Time Calculation (min): 58 min   Short Term Goals: Week 1:  PT Short Term Goal 1 (Week 1): = LTGs due to ELOS  Skilled Therapeutic Interventions/Progress Updates:    pt performed gait throughout unit with supervision. Gait outdoors on a variety of surfaces with supervision. Educated on safety awareness in community settings with pt verbalizing understanding.  Stair negotiation x 12 steps with 1 handrail with pt able to use step to pattern and perform safely with supervision.  Standing balance with tap downs forward and laterally for LT knee strengthening with min/mod A for balance and manual facilitation for posture.  Lt hand strength and coordination with peg task with pt able to perform with increased time and supervision.  nustep x 5 minutes with UEs and LEs with focus on UE grip strength with pt with good awareness of Lt UE and good attention during task. Pt let in chair with needs at hand.  Therapy Documentation Precautions:  Precautions Precautions: Fall Restrictions Weight Bearing Restrictions: No Pain:  no c/o pain   Therapy/Group: Individual Therapy  Suleika Donavan 03/01/2018, 9:29 AM

## 2018-03-01 NOTE — Progress Notes (Signed)
[place] PHYSICAL MEDICINE & REHABILITATION     PROGRESS NOTE    Subjective/Complaints: Up in bed. Pleased with progress. Anxious to get home!  ROS: Patient denies fever, rash, sore throat, blurred vision, nausea, vomiting, diarrhea, cough, shortness of breath or chest pain, joint or back pain, headache, or mood change.  Objective:  No results found. No results for input(s): WBC, HGB, HCT, PLT in the last 72 hours. No results for input(s): NA, K, CL, GLUCOSE, BUN, CREATININE, CALCIUM in the last 72 hours.  Invalid input(s): CO CBG (last 3)  Recent Labs    02/28/18 1713 02/28/18 2143 03/01/18 0634  GLUCAP 110* 168* 159*    Wt Readings from Last 3 Encounters:  02/25/18 64.1 kg  02/18/18 67.7 kg  02/18/18 65.9 kg     Intake/Output Summary (Last 24 hours) at 03/01/2018 1000 Last data filed at 03/01/2018 0820 Gross per 24 hour  Intake 720 ml  Output -  Net 720 ml    Vital Signs: Blood pressure 109/72, pulse 69, temperature 98.2 F (36.8 C), resp. rate 17, height 5' 2"  (1.575 m), weight 64.1 kg, SpO2 91 %. Physical Exam:  Constitutional: No distress . Vital signs reviewed. HEENT: EOMI, oral membranes moist Neck: supple Cardiovascular: RRR without murmur. No JVD    Respiratory: CTA Bilaterally without wheezes or rales. Normal effort    GI: BS +, non-tender, non-distended  Musculoskeletal: She exhibits noedema,tendernessor deformity.  Neurological: Speech slightly dysarthric but intelligible. Left central 7. LUE 3+/to 4-5 left deltoid, bicep, tricep, grip. LLE   3+ to 4-/5 prox to distal. RUE and RLE 4+/5. Sensory decreased to LT in left arm/leg. Fair insight and awareness. Normal language Skin: Skin iswarmand dry. She isnot diaphoretic.   Psychiatric: pleasant  Assessment/Plan: 1. Functional deficits secondary to right basal ganglia infarct which require 3+ hours per day of interdisciplinary therapy in a comprehensive inpatient rehab setting. Physiatrist  is providing close team supervision and 24 hour management of active medical problems listed below. Physiatrist and rehab team continue to assess barriers to discharge/monitor patient progress toward functional and medical goals.  Function:  Bathing Bathing position   Position: Shower  Bathing parts Body parts bathed by patient: Right arm, Left arm, Chest, Abdomen, Front perineal area, Buttocks, Right upper leg, Left upper leg, Right lower leg, Left lower leg, Back Body parts bathed by helper: Back  Bathing assist Assist Level: Supervision or verbal cues      Upper Body Dressing/Undressing Upper body dressing   What is the patient wearing?: Pull over shirt/dress Bra - Perfomed by patient: Thread/unthread right bra strap, Thread/unthread left bra strap Bra - Perfomed by helper: Hook/unhook bra (pull down sports bra) Pull over shirt/dress - Perfomed by patient: Thread/unthread right sleeve, Thread/unthread left sleeve, Pull shirt over trunk, Put head through opening          Upper body assist Assist Level: Supervision or verbal cues      Lower Body Dressing/Undressing Lower body dressing   What is the patient wearing?: Non-skid slipper socks, Pants, Underwear Underwear - Performed by patient: Thread/unthread right underwear leg, Thread/unthread left underwear leg, Pull underwear up/down   Pants- Performed by patient: Thread/unthread right pants leg, Thread/unthread left pants leg, Pull pants up/down Pants- Performed by helper: Thread/unthread right pants leg Non-skid slipper socks- Performed by patient: Don/doff right sock, Don/doff left sock   Socks - Performed by patient: Don/doff right sock, Don/doff left sock   Shoes - Performed by patient: Don/doff right shoe, Don/doff  left shoe Shoes - Performed by helper: Fasten right, Fasten left          Lower body assist Assist for lower body dressing: Supervision or verbal cues      Toileting Toileting   Toileting steps  completed by patient: Adjust clothing prior to toileting, Performs perineal hygiene, Adjust clothing after toileting   Toileting Assistive Devices: Grab bar or rail  Toileting assist Assist level: Supervision or verbal cues   Transfers Chair/bed transfer   Chair/bed transfer method: Ambulatory Chair/bed transfer assist level: Touching or steadying assistance (Pt > 75%) Chair/bed transfer assistive device: Armrests     Locomotion Ambulation     Max distance: 150 Assist level: Touching or steadying assistance (Pt > 75%)   Wheelchair          Cognition Comprehension Comprehension assist level: Follows basic conversation/direction with no assist  Expression Expression assist level: Expresses complex ideas: With extra time/assistive device  Social Interaction Social Interaction assist level: Interacts appropriately with others with medication or extra time (anti-anxiety, antidepressant).  Problem Solving Problem solving assist level: Solves complex problems: With extra time  Memory Memory assist level: More than reasonable amount of time   Medical Problem List and Plan: 1.Left-sided weakness with slurred speechsecondary to right basal ganglia infarction secondary to small vessel disease -Continue CIR PT OT speech  -home soon, team conf tomorrow 2. DVT Prophylaxis/Anticoagulation: SCDs 3. Pain Management: Headache improvedTopamax 50 mg nightly, Fioricet as needed 4. Mood:Zoloft 25 mg dailly 5. Neuropsych: This patientiscapable of making decisions on herown behalf. 6. Skin/Wound Care:Routine skin checks 7. Fluids/Electrolytes/Nutrition:encourage PO  -good po intake , check labs 8.Diabetes mellitus with peripheral neuropathy. Hemoglobin A1c 9.1. Glucotrol 5 mg twice daily.  -sugars under good control 8/16 -Diabetic teaching - CBG (last 3)  Recent Labs    02/28/18 1713 02/28/18 2143 03/01/18 0634  GLUCAP 110*  168* 159*  Controlled 03/01/2018 9.Newly diagnosed HIV. Follow-up infectious disease as outpt, afebrile -CD4 count 500 -.Biktarvy50-200--25 mg daily and tolerating  -check CMETCBC tomorrow 10.Hyperlipidemia. Lipitor 11.History of asthma. Albuterol inhaler as needed  12.  Constipation improved after sorbitol, cont start daily dose of Senokot   LOS (Days) 6 A FACE TO FACE EVALUATION WAS PERFORMED  Meredith Staggers, MD 03/01/2018 10:00 AM

## 2018-03-01 NOTE — Progress Notes (Signed)
Occupational Therapy Session Note  Patient Details  Name: [name] MRN: [id number] Date of Birth: Nov 29, 1955  Today's Date: 03/01/2018 OT Individual Time: 1330-1500 OT Individual Time Calculation (min): 90 min   Short Term Goals: Week 1:  OT Short Term Goal 1 (Week 1): STGs=LTGs secondary to estimated short LOS  Skilled Therapeutic Interventions/Progress Updates:    Pt greeted seated in recliner and agreeable to OT treatment session. Pt ambulated into bathroom without AD and supervision. Pt transferred onto commode and had successful BM and voided bladder. Mod I for peri-care and clothing management. Pt then ambulated into shower with supervision. Bathing completed with focus on functional use of L UE. Pt able to pump soap, grasp wash cloth, and use L hand to wash 50% of body. L grip strength with wringing out wash cloths and drying body parts holding onto towel. Dressing completed with supervision and increased time. Pt ambulated to therapy gym and worked on L fine motor coordination using medium yellow thera-putty. Provided pt with hand exercises handouts and home fine motor program. Pt not ready for fine motor program but graded some activities for pt to be able to complete now. Provided pt with pink foam cube and worked on grip strength. Pt ambulated back to room and left semi-reclined in bed with needs met.    Therapy Documentation Precautions:  Precautions Precautions: Fall Restrictions Weight Bearing Restrictions: No Pain: Pain Assessment Pain Score: 0-No pain  See Function Navigator for Current Functional Status.   Therapy/Group: Individual Therapy  Valma Cava 03/01/2018, 1:58 PM

## 2018-03-01 NOTE — Discharge Summary (Signed)
Discharge summary job [id number]

## 2018-03-01 NOTE — Discharge Summary (Signed)
NAMEBRITTIANY, [name] MEDICAL RECORD [id number] ACCOUNT [id number] DATE OF BIRTH:01-10-1956 FACILITY: [facility] LOCATION: [facility] PHYSICIAN:ZACHARY Tiberius.Chu, MD  DISCHARGE SUMMARY  DATE OF DISCHARGE:  03/02/2018  ADMIT DATE:  02/23/2018  DISCHARGE DATE:  03/02/2018  DISCHARGE DIAGNOSES: 1.  Right basal ganglia infarction secondary to small vessel disease.   2.  SCDs for deep venous thrombus prophylaxis. 3.  Pain management Depression. 4.  Diabetes mellitus. 5.  Peripheral neuropathy. 6.  Newly diagnosed human immunodeficiency virus. 7.  Hyperlipidemia. 8.  History of asthma. 9.  Constipation. 10.  Hypokalemia  HOSPITAL COURSE:  A 62 year old right-handed female with history of asthma, diabetes mellitus and ulcerative colitis.  Lives with spouse, independent prior to admission.  Has a daughter with good support.  Presented 02/18/2018 to [facility] with left-sided weakness and slurred speech.  Cranial CT scan with no hemorrhage or mass effect.  The patient did receive tPA.  CT angiogram of head and neck with no emergent large vessel occlusion.  MRI showed small vessel infarction of  the right basal ganglia in close proximity to the right internal capsule.  Echocardiogram with ejection fraction of 79%, grade I diastolic dysfunction.  Neurology consulted.  Maintained on aspirin and Plavix x3 weeks, then aspirin alone.  Tolerating a  regular diet.  As part of the patient's admission for CVA and workup, tested positive for HIV.  Infectious disease consulted.  CD4 count of 28.  Placed on immunosuppressive medication.  The patient was admitted for comprehensive rehabilitation program.  PAST MEDICAL HISTORY:  See discharge diagnoses.  SOCIAL HISTORY:  Lives with husband, independent prior to admission.  FUNCTIONAL STATUS:  Upon admission to rehab services was minimal guard to ambulate without assistive device step through pattern, minimal guard sit to stand, min mod  assist with activities of daily living.  PHYSICAL EXAMINATION: VITAL SIGNS:  Blood pressure 119/74, pulse 85, temperature 98, respirations 18. GENERAL:  Alert female in no acute distress.  Speech slightly dysarthric. HEENT:  EOMs intact. NECK:  Supple, nontender, no JVD. CARDIOVASCULAR:  Rate controlled. ABDOMEN:  Soft, nontender, good bowel sounds. LUNGS:  Clear to auscultation without wheeze.  REHABILITATION HOSPITAL COURSE:  The patient was admitted to inpatient rehabilitation services.  Therapies initiated on a 3-hour daily basis, consisting of physical therapy, occupational therapy, speech therapy and rehabilitation nursing.  The following  issues were addressed during patient's rehabilitation stay.  Pertaining to the patient's right basal ganglia infarction, we are going to maintain on aspirin as well as Plavix.  New findings of HIV, followed by infectious disease, maintained on treatment  with planned follow up outpatient.  Blood sugars overall controlled.  Continued on Lipitor for hyperlipidemia.  Bouts of headache maintained with Topamax.  The patient received weekly collaborative interdisciplinary team conferences to discuss estimated  length of stay, family teaching, any barriers to discharge.  The patient ambulating extended distances.  Educated on safety and awareness in the community.  Negotiating stairs with supervision, standing balance supervision.  Gathered belongings for  activities of daily living and homemaking.  The patient was discharged to home.  DISCHARGE MEDICATIONS:  Included aspirin 81 mg p.o. daily, Lipitor 40 mg p.o. daily, Biktarvy 50/200/25 mg 1 tablet p.o. daily, Senokot-S 1 tablet p.o. b.i.d., Zoloft 25 mg p.o. at bedtime, Topamax 50 mg p.o. at bedtime, Plavix 75 mg p.o. daily, potassium chloride 20 mEq daily  The patient's diet was a regular consistency.  The patient would follow up with Dr. Alger Simons at the outpatient  rehab service office as directed; Dr.  Erlinda Hong at [facility] Neurology Service call for appointment; Dr. Carlyle Basques infectious disease; [facility] medical management.  TN/NUANCE D:03/01/2018 T:03/01/2018 JOB:[id number]

## 2018-03-01 NOTE — Progress Notes (Signed)
Speech Language Pathology Daily Session Note  Patient Details  Name: [name] MRN: [id number] Date of Birth: 14-Apr-1956  Today's Date: 03/01/2018 SLP Individual Time: 4098-1191 SLP Individual Time Calculation (min): 55 min  Short Term Goals: Week 1: SLP Short Term Goal 1 (Week 1): STG=LTG due to ELOS  Skilled Therapeutic Interventions: Skilled treatment session focused on cognitive goals. SLP facilitated session by providing supervision verbal cues for recall of procedures to a previously learned task and for complex problem solving with the task. Patient also participated in d/c planning with Mod I. Patient left upright in recliner with all needs within reach. Continue with current plan of care.      Function:   Cognition Comprehension Comprehension assist level: Understands complex 90% of the time/cues 10% of the time  Expression   Expression assist level: Expresses complex 90% of the time/cues < 10% of the time  Social Interaction Social Interaction assist level: Interacts appropriately 90% of the time - Needs monitoring or encouragement for participation or interaction.  Problem Solving Problem solving assist level: Solves complex 90% of the time/cues < 10% of the time  Memory Memory assist level: Recognizes or recalls 90% of the time/requires cueing < 10% of the time    Pain No/Denies Pain   Therapy/Group: Individual Therapy  Angelica Frandsen 03/01/2018, 3:22 PM

## 2018-03-02 ENCOUNTER — Inpatient Hospital Stay (HOSPITAL_COMMUNITY): Payer: Self-pay | Admitting: Speech Pathology

## 2018-03-02 ENCOUNTER — Inpatient Hospital Stay (HOSPITAL_COMMUNITY): Payer: Self-pay | Admitting: Occupational Therapy

## 2018-03-02 ENCOUNTER — Inpatient Hospital Stay (HOSPITAL_COMMUNITY): Payer: Self-pay | Admitting: Physical Therapy

## 2018-03-02 LAB — COMPREHENSIVE METABOLIC PANEL
ALBUMIN: 3.7 g/dL (ref 3.5–5.0)
ALK PHOS: 109 U/L (ref 38–126)
ALT: 36 U/L (ref 0–44)
AST: 24 U/L (ref 15–41)
Anion gap: 9 (ref 5–15)
BILIRUBIN TOTAL: 0.8 mg/dL (ref 0.3–1.2)
BUN: 14 mg/dL (ref 8–23)
CO2: 24 mmol/L (ref 22–32)
Calcium: 9.1 mg/dL (ref 8.9–10.3)
Chloride: 106 mmol/L (ref 98–111)
Creatinine, Ser: 1.17 mg/dL — ABNORMAL HIGH (ref 0.44–1.00)
GFR calc Af Amer: 57 mL/min — ABNORMAL LOW (ref >60)
GFR calc non Af Amer: 49 mL/min — ABNORMAL LOW (ref >60)
GLUCOSE: 133 mg/dL — AB (ref 70–99)
POTASSIUM: 3.2 mmol/L — AB (ref 3.5–5.1)
Sodium: 139 mmol/L (ref 135–145)
TOTAL PROTEIN: 7 g/dL (ref 6.5–8.1)

## 2018-03-02 LAB — CBC
HEMATOCRIT: 35.8 % — AB (ref 36.0–46.0)
HEMOGLOBIN: 11.4 g/dL — AB (ref 12.0–15.0)
MCH: 27.9 pg (ref 26.0–34.0)
MCHC: 31.8 g/dL (ref 30.0–36.0)
MCV: 87.5 fL (ref 78.0–100.0)
Platelets: 253 10*3/uL (ref 150–400)
RBC: 4.09 MIL/uL (ref 3.87–5.11)
RDW: 12.9 % (ref 11.5–15.5)
WBC: 4.7 10*3/uL (ref 4.0–10.5)

## 2018-03-02 LAB — GLUCOSE, CAPILLARY: GLUCOSE-CAPILLARY: 126 mg/dL — AB (ref 70–99)

## 2018-03-02 MED ORDER — ACETAMINOPHEN 325 MG PO TABS: 650.0000 mg | ORAL_TABLET | Freq: Four times a day (QID) | ORAL | Status: DC | PRN

## 2018-03-02 MED ORDER — TOPIRAMATE 50 MG PO TABS: 50.0000 mg | ORAL_TABLET | Freq: Every day | ORAL | 1 refills | Status: DC

## 2018-03-02 MED ORDER — BICTEGRAVIR-EMTRICITAB-TENOFOV 50-200-25 MG PO TABS: 1.0000 | ORAL_TABLET | Freq: Every day | ORAL | 1 refills | Status: DC

## 2018-03-02 MED ORDER — ATORVASTATIN CALCIUM 40 MG PO TABS: 40.0000 mg | ORAL_TABLET | Freq: Every day | ORAL | 0 refills | Status: DC

## 2018-03-02 MED ORDER — SENNOSIDES-DOCUSATE SODIUM 8.6-50 MG PO TABS: 1.0000 | ORAL_TABLET | Freq: Two times a day (BID) | ORAL | Status: DC

## 2018-03-02 MED ORDER — POTASSIUM CHLORIDE CRYS ER 20 MEQ PO TBCR
20.0000 meq | EXTENDED_RELEASE_TABLET | Freq: Every day | ORAL | Status: DC
  Administered 2018-03-02: 20 meq via ORAL

## 2018-03-02 MED ORDER — SERTRALINE HCL 25 MG PO TABS: 25.0000 mg | ORAL_TABLET | Freq: Every day | ORAL | 2 refills | Status: DC

## 2018-03-02 MED ORDER — CLOPIDOGREL BISULFATE 75 MG PO TABS: 75.0000 mg | ORAL_TABLET | Freq: Every day | ORAL | 11 refills | Status: DC

## 2018-03-02 MED ORDER — POTASSIUM CHLORIDE CRYS ER 20 MEQ PO TBCR: 20.0000 meq | EXTENDED_RELEASE_TABLET | Freq: Every day | ORAL | 0 refills | Status: DC

## 2018-03-02 NOTE — Progress Notes (Signed)
Social Work  Discharge Note  The overall goal for the admission was met for:   Discharge location: Yes - home with spouse and adult daughter to provide any needed assistance  Length of Stay: Yes - 7 days  Discharge activity level: Yes - independent overall  Home/community participation: Yes  Services provided included: MD, RD, PT, OT, SLP, RN, TR, Pharmacy and SW  Financial Services: Private Insurance: BCBS and T[employer]  Follow-up services arranged: Home Health: PT, OT, ST via [facility], DME: tub seat via [facility] and Patient/Family has no preference for HH/DME agencies  Comments (or additional information):  Patient/Family verbalized understanding of follow-up arrangements: Yes  Individual responsible for coordination of the follow-up plan: pt  Confirmed correct DME delivered: ,  03/02/2018    ,  

## 2018-03-02 NOTE — Progress Notes (Signed)
[place] PHYSICAL MEDICINE & REHABILITATION     PROGRESS NOTE    Subjective/Complaints: Excited to leave. Feels prepared  ROS: Patient denies fever, rash, sore throat, blurred vision, nausea, vomiting, diarrhea, cough, shortness of breath or chest pain, joint or back pain, headache, or mood change.     Objective:  No results found. Recent Labs    03/02/18 0641  WBC 4.7  HGB 11.4*  HCT 35.8*  PLT 253   Recent Labs    03/02/18 0641  NA 139  K 3.2*  CL 106  GLUCOSE 133*  BUN 14  CREATININE 1.17*  CALCIUM 9.1   CBG (last 3)  Recent Labs    03/01/18 1645 03/01/18 2121 03/02/18 0653  GLUCAP 139* 129* 126*    Wt Readings from Last 3 Encounters:  02/25/18 64.1 kg  02/18/18 67.7 kg  02/18/18 65.9 kg     Intake/Output Summary (Last 24 hours) at 03/02/2018 0910 Last data filed at 03/01/2018 1834 Gross per 24 hour  Intake 422 ml  Output -  Net 422 ml    Vital Signs: Blood pressure 122/75, pulse 71, temperature 97.8 F (36.6 C), temperature source Oral, resp. rate 18, height 5' 2"  (1.575 m), weight 64.1 kg, SpO2 90 %. Physical Exam:  Constitutional: No distress . Vital signs reviewed. HEENT: EOMI, oral membranes moist Neck: supple Cardiovascular: RRR without murmur. No JVD    Respiratory: CTA Bilaterally without wheezes or rales. Normal effort    GI: BS +, non-tender, non-distended  Musculoskeletal: She exhibits noedema,tendernessor deformity.  Neurological: Speech slightly dysarthric but intelligible. Left central 7. LUE 4-/5 left deltoid, bicep, tricep, grip. LLE    4-/5 prox to distal. RUE and RLE 4+/5. Sensory decreased to LT in left arm/leg. Fair insight and awareness. Normal language Skin: Skin iswarmand dry. She isnot diaphoretic.   Psychiatric: pleasant  Assessment/Plan: 1. Functional deficits secondary to right basal ganglia infarct which require 3+ hours per day of interdisciplinary therapy in a comprehensive inpatient rehab  setting. Physiatrist is providing close team supervision and 24 hour management of active medical problems listed below. Physiatrist and rehab team continue to assess barriers to discharge/monitor patient progress toward functional and medical goals.  Function:  Bathing Bathing position   Position: Shower  Bathing parts Body parts bathed by patient: Right arm, Left arm, Chest, Abdomen, Front perineal area, Buttocks, Right upper leg, Left upper leg, Right lower leg, Left lower leg, Back Body parts bathed by helper: Back  Bathing assist Assist Level: Set up   Set up : To obtain items  Upper Body Dressing/Undressing Upper body dressing   What is the patient wearing?: Pull over shirt/dress, Bra Bra - Perfomed by patient: Thread/unthread left bra strap, Thread/unthread right bra strap, Hook/unhook bra (pull down sports bra) Bra - Perfomed by helper: Hook/unhook bra (pull down sports bra) Pull over shirt/dress - Perfomed by patient: Thread/unthread right sleeve, Thread/unthread left sleeve, Put head through opening, Pull shirt over trunk          Upper body assist Assist Level: More than reasonable time      Lower Body Dressing/Undressing Lower body dressing   What is the patient wearing?: Socks, Shoes, Pants, Underwear Underwear - Performed by patient: Thread/unthread right underwear leg, Thread/unthread left underwear leg, Pull underwear up/down   Pants- Performed by patient: Thread/unthread right pants leg, Thread/unthread left pants leg, Pull pants up/down Pants- Performed by helper: Thread/unthread right pants leg Non-skid slipper socks- Performed by patient: Don/doff right sock, Don/doff left  sock   Socks - Performed by patient: Don/doff right sock, Don/doff left sock   Shoes - Performed by patient: Don/doff left shoe, Don/doff right shoe(shoes dont fasten) Shoes - Performed by helper: Fasten right, Fasten left          Lower body assist Assist for lower body dressing:  More than reasonable time      Toileting Toileting   Toileting steps completed by patient: Adjust clothing prior to toileting, Performs perineal hygiene, Adjust clothing after toileting   Toileting Assistive Devices: Grab bar or rail  Toileting assist Assist level: More than reasonable time   Transfers Chair/bed transfer   Chair/bed transfer method: Ambulatory Chair/bed transfer assist level: Touching or steadying assistance (Pt > 75%) Chair/bed transfer assistive device: Armrests     Locomotion Ambulation     Max distance: 150 Assist level: Touching or steadying assistance (Pt > 75%)   Wheelchair          Cognition Comprehension Comprehension assist level: Understands complex 90% of the time/cues 10% of the time  Expression Expression assist level: Expresses complex 90% of the time/cues < 10% of the time  Social Interaction Social Interaction assist level: Interacts appropriately 90% of the time - Needs monitoring or encouragement for participation or interaction.  Problem Solving Problem solving assist level: Solves complex 90% of the time/cues < 10% of the time  Memory Memory assist level: Recognizes or recalls 90% of the time/requires cueing < 10% of the time   Medical Problem List and Plan: 1.Left-sided weakness with slurred speechsecondary to right basal ganglia infarction secondary to small vessel disease -dc home today  -Patient to see Rehab MD/provider in the office for transitional care encounter in 1-2 weeks.  2. DVT Prophylaxis/Anticoagulation: SCDs 3. Pain Management: Headache improved with topamax 50 mg nightly, Fioricet as needed 4. Mood:Zoloft 25 mg dailly 5. Neuropsych: This patientiscapable of making decisions on herown behalf. 6. Skin/Wound Care:Routine skin checks 7. Fluids/Electrolytes/Nutrition:encourage PO  -good po intake    -add potassium supplement 42mq daily 8.Diabetes mellitus with peripheral neuropathy. Hemoglobin  A1c 9.1. Glucotrol 5 mg twice daily.  -sugars under good control 8/16 -Diabetic teaching - CBG (last 3)  Recent Labs    03/01/18 1645 03/01/18 2121 03/02/18 0653  GLUCAP 139* 129* 126*  Controlled 03/02/2018 9.Newly diagnosed HIV. Follow-up infectious disease as outpt, afebrile -CD4 count 500 -.Biktarvy50-200--25 mg daily and tolerating  -LFT's normal today, cbc stable  -discussed with patient re: desire to keep dx discreet 10.Hyperlipidemia. Lipitor 11.History of asthma. Albuterol inhaler as needed      LOS (Days) 7CuberoEVALUATION WAS PERFORMED  ZMeredith Staggers MD 03/02/2018 9:10 AM

## 2018-03-02 NOTE — Progress Notes (Signed)
Occupational Therapy Discharge Summary  Patient Details  Name: [name] MRN: [id number] Date of Birth: [birth year]   OT treatment session focused on increased independence with BADL and iADL tasks. Pt able to access dresser drawers, collect clothing, ambulate to bathroom without AD and mod I.  Bathing/dressing completed mod I with increased time. Grooming tasks mod I. Pt ambulated to therapy apartment and practiced walk-in shower transfer in simulated home environment mod I.  Educated pt on safety  Awareness within simple meal prep task. Worked on incorporating L UE within cooking task to stir with built up handle and use L hand to stabilize objects. L UE weight bearing to wash down countertops. Educated pt on home safety awareness and BADL/iADL modifications within home environment. Pt ambulated back to room and left seated in recliner with needs met.    Today's Date: 03/02/2018 OT Individual Time: 1030-1200 OT Individual Time Calculation (min): 90 min    Patient has met 14 of 14 long term goals due to improved activity tolerance, improved balance, postural control, ability to compensate for deficits and functional use of  LEFT upper and LEFT lower extremity.  Patient to discharge at overall Modified Independent level.  Patient's care partner is independent to provide the necessary physical assistance at discharge for higher level iADL tasks.    Reasons goals not met: n/a  Recommendation:  Patient will benefit from ongoing skilled OT services in outpatient setting to continue to advance functional skills in the area of functional use of L UE.  Equipment: shower chair  Reasons for discharge: treatment goals met and discharge from hospital  Patient/family agrees with progress made and goals achieved: Yes  OT Discharge Precautions/Restrictions  Precautions Precautions: Fall Restrictions Weight Bearing Restrictions: No Pain Pain Assessment Pain Scale: 0-10 Pain Score: 0-No  pain ADL ADL Eating: Independent Grooming: Independent Upper Body Bathing: Independent Lower Body Bathing: Independent Upper Body Dressing: Independent Lower Body Dressing: Independent Toileting: Independent Toilet Transfer: Independent Science writer: Radiographer, therapeutic Method: Optometrist: Shower seat with back Cognition Overall Cognitive Status: Within Functional Limits for tasks assessed Arousal/Alertness: Awake/alert Orientation Level: Oriented X4 Sensation Sensation Light Touch: Impaired Detail Central sensation comments: diminished LLE Light Touch Impaired Details: Impaired LLE;Impaired LUE Proprioception Impaired Details: Impaired LLE;Impaired LUE Coordination Gross Motor Movements are Fluid and Coordinated: No Fine Motor Movements are Fluid and Coordinated: No Coordination and Movement Description: Improved L hand strength/coordination Heel Shin Test: mildly limited excursion and speed LLE Motor  Motor Motor: Hemiplegia Motor - Discharge Observations: Lt hemiplegia Mobility  Bed Mobility Rolling Left: Independent Right Sidelying to Sit: Independent  Trunk/Postural Assessment  Cervical Assessment Cervical Assessment: Within Functional Limits Thoracic Assessment Thoracic Assessment: Within Functional Limits Lumbar Assessment Lumbar Assessment: Within Functional Limits Postural Control Postural Control: Within Functional Limits  Balance Balance Balance Assessed: Yes Static Sitting Balance Static Sitting - Level of Assistance: 7: Independent Dynamic Sitting Balance Dynamic Sitting - Level of Assistance: 7: Independent Static Standing Balance Static Standing - Level of Assistance: 7: Independent Dynamic Standing Balance Dynamic Standing - Level of Assistance: 7: Independent Extremity/Trunk Assessment RUE Assessment RUE Assessment: Within Functional Limits LUE Assessment LUE Assessment: Exceptions to  [facility] Passive Range of Motion (PROM) Comments: WFL General Strength Comments: 4-/5 shoulder/elbow, 3+/5 wrist/hand LUE Body System: Neuro Brunstrum levels for arm and hand: Arm;Hand Brunstrum level for arm: Stage V Relative Independence from Synergy Brunstrum level for hand: Stage VI Isolated joint movements   See Function Navigator for Current Functional Status.  Daneen Schick Vena Bassinger 03/02/2018, 11:26 AM

## 2018-03-02 NOTE — Progress Notes (Signed)
Speech Language Pathology Discharge Summary  Patient Details  Name: [name] MRN: [id number] Date of Birth: 1956/06/04  Today's Date: 03/02/2018 SLP Individual Time: 1250-1345 SLP Individual Time Calculation (min): 55 min   Skilled Therapeutic Interventions:  Skilled treatment session focused on cognitive goals. SLP facilitated session by administering the MoCA (version 7.2). Patient scored 30/30 points, however, patient required extra time for complex problem solving. SLP also facilitated session with a functional conversation that focused on anticipatory awareness and d/c planning. Patient was overall Mod I throughout conversation in regards to generating a list of tasks/activties she can complete safely at home. All education is complete and patient will discharge home today with family.   Patient has met 8 of 8 long term goals.  Patient to discharge at overall Supervision;Modified Independent level.   Reasons goals not met: N/a   Clinical Impression/Discharge Summary: Patient has made excellent gains and has met 8 of 8 LTGs this admission. Currently, patient is consuming regular textures with thin liquids without overt s/s of aspiration and overall Mod I to self-monitor and correct left buccal pocketing and anterior spillage. Patient is ~90-100% intelligible at the conversation level but can require intermittent cues for a slow rate of speech and for utilization of over-articulation. Patient is also overall supervision-Mod I to complete functional and minimally complex tasks safely in regards to problem solving, recall and awareness. However, patient requires extra time and overall supervision-Min A verbal cues for mildly-moderately complex problem solving. Patient education complete and patient will discharge home with assistance from family. Patient would benefit from f/u SLP services to maximize her cognitive functioning and speech intelligibility in order to maximize her overall  functional independence.   Care Partner:  Caregiver Able to Provide Assistance: Yes     Recommendation:  Home Health SLP(intermittent supervision )  Rationale for SLP Follow Up: Maximize cognitive function and independence;Maximize functional communication;Reduce caregiver burden   Equipment: N/A   Reasons for discharge: Treatment goals met;Discharged from hospital   Patient/Family Agrees with Progress Made and Goals Achieved: Yes   Function:   Cognition Comprehension Comprehension assist level: Understands complex 90% of the time/cues 10% of the time  Expression   Expression assist level: Expresses complex 90% of the time/cues < 10% of the time  Social Interaction Social Interaction assist level: Interacts appropriately 90% of the time - Needs monitoring or encouragement for participation or interaction.  Problem Solving Problem solving assist level: Solves complex 90% of the time/cues < 10% of the time  Memory Memory assist level: Recognizes or recalls 90% of the time/requires cueing < 10% of the time   [name] 03/02/2018, 3:29 PM

## 2018-03-02 NOTE — Patient Care Conference (Addendum)
Inpatient RehabilitationTeam Conference and Plan of Care Update Date: 03/02/2018   Time: 2:57 PM    Patient Name: [name]      Medical Record Number: [id number]  Date of Birth: September 20, 1955 Sex: Female         Room/Bed: 4W13C/4W13C-01 Payor Info: Payor: TRICARE / Plan: TRICARE [place] / Product Type: *No Product type* /    Admitting Diagnosis: CVA  Admit Date/Time:  02/23/2018  7:07 PM Admission Comments: No comment available   Primary Diagnosis:  <principal problem not specified> Principal Problem: <principal problem not specified>  Patient Active Problem List   Diagnosis Date Noted  . Essential hypertension 02/23/2018  . Migraine 02/23/2018  . Infarction of right basal ganglia ([place]) 02/23/2018  . Hypokalemia   . HIV disease ([place])   . 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 HLD (hyperlipidemia) 08/02/2014  . Type 2 diabetes mellitus ([place]) 07/31/2014  . Ulcerative colitis ([place]) 06/26/2008    Expected Discharge Date: Expected Discharge Date: 03/02/18  Team Members Present: Physician leading conference: Dr. Alger Simons Social Worker Present: Lennart Pall, LCSW Nurse Present: Leonette Nutting, RN PT Present: Roderic Ovens, PT OT Present: Cherylynn Ridges, OT SLP Present: Weston Anna, SLP PPS Coordinator present : Daiva Nakayama, RN, CRRN     Current Status/Progress Goal Weekly Team Focus  Medical   right basal ganglia infarct, HIV, improving motor/sensory  finalize medical plan for dc  monitoring LFTs, cbc, post-stroke sequelae   Bowel/Bladder   Pt continent of bowel and bladder with mod i ASSIST  Manage bowel and bladder with mod I assist  monitor.    Swallow/Nutrition/ Hydration   Mod I with regular textures with thin liquids  Mod I  D/C home today    ADL's   Mod I/supervision overall  Mod I BADLs, Supervision iADLs  pt/family education, dc planning, functional use of L UE, balance, coordination, modified bathing/dressing    Mobility   mod I  mod I  family ed, d/c planning   Communication   Mod I  Mod I  D/C home today   Safety/Cognition/ Behavioral Observations  Mod I for mildly complex  Mod I  D/C home today    Pain   No complaints of pain.   <3 out of 10.   Assess and treat q shift and prn.    Skin   Clean, DRy, intact   No new breakdown.   Monitor.     Rehab Goals Patient on target to meet rehab goals: Yes *See Care Plan and progress notes for long and short-term goals.     Barriers to Discharge  Current Status/Progress Possible Resolutions Date Resolved   Physician    Medical stability               Nursing                  PT                    OT                  SLP                SW                Discharge Planning/Teaching Needs:  Pt to d/c home with spouse who can provide any needed support as well as their adult daughter.  NA - reaching independent LOF  Team Discussion:  Pt has reached independent goals and ready for d/c today with HH.  No concerns.    Revisions to Treatment Plan:  NA    Continued Need for Acute Rehabilitation Level of Care: The patient requires daily medical management by a physician with specialized training in physical medicine and rehabilitation for the following conditions: Daily direction of a multidisciplinary physical rehabilitation program to ensure safe treatment while eliciting the highest outcome that is of practical value to the patient.: Yes Daily medical management of patient stability for increased activity during participation in an intensive rehabilitation regime.: Yes Daily analysis of laboratory values and/or radiology reports with any subsequent need for medication adjustment of medical intervention for : Neurological problems;Other  Jadon Ressler 03/02/2018, 2:57 PM

## 2018-03-02 NOTE — Progress Notes (Signed)
Physical Therapy Discharge Summary  Patient Details  Name: [name] MRN: [id number] Date of Birth: 02-Jul-1956  Today's Date: 03/02/2018 PT Individual Time: 0830-0925 PT Individual Time Calculation (min): 55 min   Pt performs gait throughout unit with mod I including home and controlled environments. Pt performs gait on uneven surfaces, ramp and curb negotiation with supervision, no LOB.  Stair negotiation with 1 handrail with supervision.  Quadruped alternating LE and UE lifts for Lt side strength and stability with min manual facilitation especially for Lt scapular stability.  Floor transfer and education on fall recovery and safety with pt able to perform with supervision.  wii balance tasks for balance and strengthening with wii balance soccer, tight rope and skiing games.  Pt able to identify difficulty with Rt wt shifts and improved with repetition and manual facilitation.  Pt states she feels ready to d/c home today.  Patient has met 8 of 8 long term goals due to improved activity tolerance, improved balance, improved postural control, ability to compensate for deficits and functional use of  left lower extremity.  Patient to discharge at an ambulatory level Independent.     Reasons goals not met: n/a  Recommendation:  Patient will benefit from ongoing skilled PT services in home health setting to continue to advance safe functional mobility, address ongoing impairments in strength, balance, gait, and minimize fall risk.  Equipment: No equipment provided  Reasons for discharge: treatment goals met and discharge from hospital  Patient/family agrees with progress made and goals achieved: Yes  PT Discharge Precautions/Restrictions Precautions Precautions: Fall Restrictions Weight Bearing Restrictions: No Pain Pain Assessment Pain Score: 0-No pain  Cognition Overall Cognitive Status: Within Functional Limits for tasks assessed Arousal/Alertness:  Awake/alert Sensation Sensation Central sensation comments: diminished LLE Light Touch Impaired Details: Impaired LLE;Impaired LUE Proprioception Impaired Details: Impaired LLE;Impaired LUE Coordination Gross Motor Movements are Fluid and Coordinated: No Fine Motor Movements are Fluid and Coordinated: No Heel Shin Test: mildly limited excursion and speed LLE Motor  Motor Motor: Hemiplegia Motor - Discharge Observations: Lt hemiplegia  Mobility Bed Mobility Rolling Left: Independent Right Sidelying to Sit: Independent Transfers Stand Pivot Transfers: Independent Transfer (Assistive device): None Locomotion  Gait Gait Assistance: Independent Gait Distance (Feet): 200 Feet Assistive device: None Stairs / Additional Locomotion Stairs: Yes Stairs Assistance: Supervision/Verbal cueing Stair Management Technique: One rail Left Number of Stairs: 12 Ramp: Supervision/Verbal cueing Curb: Supervision/Verbal cueing Wheelchair Mobility Wheelchair Mobility: No  Trunk/Postural Assessment  Cervical Assessment Cervical Assessment: Within Functional Limits Thoracic Assessment Thoracic Assessment: Within Functional Limits Lumbar Assessment Lumbar Assessment: Within Functional Limits Postural Control Postural Control: Within Functional Limits  Balance Static Sitting Balance Static Sitting - Level of Assistance: 7: Independent Dynamic Sitting Balance Dynamic Sitting - Level of Assistance: 7: Independent Static Standing Balance Static Standing - Level of Assistance: 7: Independent Dynamic Standing Balance Dynamic Standing - Level of Assistance: 7: Independent Extremity Assessment      RLE Assessment RLE Assessment: Within Functional Limits LLE Assessment General Strength Comments: grossly 3+/5   See Function Navigator for Current Functional Status.  DONAWERTH,KAREN 03/02/2018, 9:29 AM

## 2018-03-02 NOTE — Discharge Instructions (Signed)
Inpatient Rehab Discharge Instructions  [name] Discharge date and time: No discharge date for patient encounter.   Activities/Precautions/ Functional Status: Activity: activity as tolerated Diet: diabetic diet Wound Care: none needed Functional status:  ___ No restrictions     ___ Walk up steps independently ___ 24/7 supervision/assistance   ___ Walk up steps with assistance ___ Intermittent supervision/assistance  ___ Bathe/dress independently ___ Walk with walker     _x STROKE/TIA DISCHARGE INSTRUCTIONS SMOKING Cigarette smoking nearly doubles your risk of having a stroke & is the single most alterable risk factor  If you smoke or have smoked in the last 12 months, you are advised to quit smoking for your health.  Most of the excess cardiovascular risk related to smoking disappears within a year of stopping.  Ask you doctor about anti-smoking medications  [place] Quit Line: 1-800-QUIT NOW  Free Smoking Cessation Classes [phone]  CHOLESTEROL Know your levels; limit fat & cholesterol in your diet  Lipid Panel     Component Value Date/Time   CHOL 243 (H) 02/19/2018 0301   CHOL 211 (H) 06/22/2014 1432   TRIG 349 (H) 02/19/2018 0301   TRIG 473 (H) 06/22/2014 1432   HDL 37 (L) 02/19/2018 0301   HDL 42 06/22/2014 1432   CHOLHDL 6.6 02/19/2018 0301   VLDL 70 (H) 02/19/2018 0301   VLDL SEE COMMENT 06/22/2014 1432   LDLCALC 136 (H) 02/19/2018 0301   LDLCALC SEE COMMENT 06/22/2014 1432      Many patients benefit from treatment even if their cholesterol is at goal.  Goal: Total Cholesterol (CHOL) less than 160  Goal:  Triglycerides (TRIG) less than 150  Goal:  HDL greater than 40  Goal:  LDL (LDLCALC) less than 100   BLOOD PRESSURE American Stroke Association blood pressure target is less that 120/80 mm/Hg  Your discharge blood pressure is:  BP: 111/75  Monitor your blood pressure  Limit your salt and alcohol intake  Many individuals will require more than  one medication for high blood pressure  DIABETES (A1c is a blood sugar average for last 3 months) Goal HGBA1c is under 7% (HBGA1c is blood sugar average for last 3 months)  Diabetes:    Lab Results  Component Value Date   HGBA1C 9.1 (H) 02/19/2018     Your HGBA1c can be lowered with medications, healthy diet, and exercise.  Check your blood sugar as directed by your physician  Call your physician if you experience unexplained or low blood sugars.  PHYSICAL ACTIVITY/REHABILITATION Goal is 30 minutes at least 4 days per week  Activity: Increase activity slowly, Therapies: Physical Therapy: Home Health Return to work:   Activity decreases your risk of heart attack and stroke and makes your heart stronger.  It helps control your weight and blood pressure; helps you relax and can improve your mood.  Participate in a regular exercise program.  Talk with your doctor about the best form of exercise for you (dancing, walking, swimming, cycling).  DIET/WEIGHT Goal is to maintain a healthy weight  Your discharge diet is:  Diet Order            Diet heart healthy/carb modified Room service appropriate? Yes; Fluid consistency: Thin  Diet effective now              liquids Your height is:  Height: 5' 2"  (157.5 cm) Your current weight is:   Your Body Mass Index (BMI) is:     Following the type of diet specifically  designed for you will help prevent another stroke.  Your goal weight range is:    Your goal Body Mass Index (BMI) is 19-24.  Healthy food habits can help reduce 3 risk factors for stroke:  High cholesterol, hypertension, and excess weight.  RESOURCES Stroke/Support Group:  Call [phone]   STROKE EDUCATION PROVIDED/REVIEWED AND GIVEN TO PATIENT Stroke warning signs and symptoms How to activate emergency medical system (call 911). Medications prescribed at discharge. Need for follow-up after discharge. Personal risk factors for stroke. Pneumonia vaccine given:  Flu  vaccine given:  My questions have been answered, the writing is legible, and I understand these instructions.  I will adhere to these goals & educational materials that have been provided to me after my discharge from the hospital.   __ Bathe/dress with assistance ___ Walk Independently    ___ Shower independently ___ Walk with assistance    ___ Shower with assistance ___ No alcohol     ___ Return to work/school ________     COMMUNITY REFERRALS UPON DISCHARGE:    Home Health:   PT     OT     ST                     Agency:  [facility] Phone: [phone]   Medical Equipment/Items Ordered:  Tub seat                                                      Agency/Supplier:  [facility] @ [phone]   GENERAL COMMUNITY RESOURCES FOR PATIENT/FAMILY:  Support Groups:  Stroke Support Group (flyer)        Special Instructions:    My questions have been answered and I understand these instructions. I will adhere to these goals and the provided educational materials after my discharge from the hospital.  Patient/Caregiver Signature _______________________________ Date __________  Clinician Signature _______________________________________ Date __________  Please bring this form and your medication list with you to all your follow-up doctor's appointments.

## 2018-03-02 NOTE — Progress Notes (Signed)
Pt given discharge instructions via PA. Pt has all home equipment at bedside and all questions were answered. Pt discharged to home with family.

## 2018-03-03 LAB — GLUCOSE, CAPILLARY: GLUCOSE-CAPILLARY: 137 mg/dL — AB (ref 70–99)

## 2018-03-04 ENCOUNTER — Telehealth: Payer: Self-pay

## 2018-03-04 ENCOUNTER — Telehealth: Payer: Self-pay | Admitting: *Deleted

## 2018-03-04 NOTE — Telephone Encounter (Signed)
Transitional Care call  Patient name: [name], [name]) DOB: (December 18, 1955) 1. Are you/is patient experiencing any problems since coming home? (NO) a. Are there any questions regarding any aspect of care? (NO) 2. Are there any questions regarding medications administration/dosing? (NO) a. Are meds being taken as prescribed? (YES) b. "Patient should review meds with caller to confirm"  3. Have there been any falls? (YES, SLID OFF COUCH, NO INURY) 4. Has Home Health been to the house and/or have they contacted you? (YES) a. If not, have you tried to contact them? (NA) b. Can we help you contact them? (NA) 5. Are bowels and bladder emptying properly? (YES) a. Are there any unexpected incontinence issues? (NA) b. If applicable, is patient following bowel/bladder programs? (NA) 6. Any fevers, problems with breathing, unexpected pain? (NO) 7. Are there any skin problems or new areas of breakdown? (NO) 8. Has the patient/family member arranged specialty MD follow up (ie cardiology/neurology/renal/surgical/etc.)?  (YES) a. Can we help arrange? (NA) 9. Does the patient need any other services or support that we can help arrange? (NO) 10. Are caregivers following through as expected in assisting the patient? (YES) 11. Has the patient quit smoking, drinking alcohol, or using drugs as recommended? (NA)  Appointment date/time (03-08-2018 / 2:00pm), arrive time (1:40pm) and who it is with here (Dr. Naaman Plummer) [place]

## 2018-03-04 NOTE — Telephone Encounter (Signed)
Transition Care Management Follow-up Telephone Call   Date discharged? 03/02/2018     How have you been since you were released from the hospital? L hand is a little weak   Do you understand why you were in the hospital? yes   Do you understand the discharge instructions? yes   Where were you discharged to? home   Items Reviewed:  Medications reviewed: yes  Allergies reviewed: yes  Dietary changes reviewed: yes  Referrals reviewed: yes   Functional Questionnaire:   Activities of Daily Living (ADLs):   She states they are independent in the following: independent in all areas    Any transportation issues/concerns?: no   Any patient concerns? no   Confirmed importance and date/time of follow-up visits scheduled yes  Provider Appointment booked with [facility] @ 03/08/2018 @ 1545  Confirmed with patient if condition begins to worsen call PCP or go to the ER.  Patient was given the office number and encouraged to call back with question or concerns.  : yes

## 2018-03-05 ENCOUNTER — Telehealth: Payer: Self-pay

## 2018-03-05 DIAGNOSIS — G43909 Migraine, unspecified, not intractable, without status migrainosus: Secondary | ICD-10-CM | POA: Diagnosis not present

## 2018-03-05 DIAGNOSIS — J45909 Unspecified asthma, uncomplicated: Secondary | ICD-10-CM | POA: Diagnosis not present

## 2018-03-05 DIAGNOSIS — E785 Hyperlipidemia, unspecified: Secondary | ICD-10-CM | POA: Diagnosis not present

## 2018-03-05 DIAGNOSIS — I69318 Other symptoms and signs involving cognitive functions following cerebral infarction: Secondary | ICD-10-CM | POA: Diagnosis not present

## 2018-03-05 DIAGNOSIS — I69354 Hemiplegia and hemiparesis following cerebral infarction affecting left non-dominant side: Secondary | ICD-10-CM | POA: Diagnosis not present

## 2018-03-05 DIAGNOSIS — I10 Essential (primary) hypertension: Secondary | ICD-10-CM | POA: Diagnosis not present

## 2018-03-05 DIAGNOSIS — K5190 Ulcerative colitis, unspecified, without complications: Secondary | ICD-10-CM | POA: Diagnosis not present

## 2018-03-05 DIAGNOSIS — I69322 Dysarthria following cerebral infarction: Secondary | ICD-10-CM | POA: Diagnosis not present

## 2018-03-05 DIAGNOSIS — B20 Human immunodeficiency virus [HIV] disease: Secondary | ICD-10-CM | POA: Diagnosis not present

## 2018-03-05 DIAGNOSIS — E1142 Type 2 diabetes mellitus with diabetic polyneuropathy: Secondary | ICD-10-CM | POA: Diagnosis not present

## 2018-03-05 DIAGNOSIS — Z7982 Long term (current) use of aspirin: Secondary | ICD-10-CM | POA: Diagnosis not present

## 2018-03-05 DIAGNOSIS — I69391 Dysphagia following cerebral infarction: Secondary | ICD-10-CM | POA: Diagnosis not present

## 2018-03-05 DIAGNOSIS — R1310 Dysphagia, unspecified: Secondary | ICD-10-CM | POA: Diagnosis not present

## 2018-03-05 NOTE — Telephone Encounter (Signed)
Merry Proud, PT/ADV[facility] called requesting verbal orders for PT for 1wk1, 2wk2, 1wk1 for strengthen balance and gait training. Verbal orders approved per discharge summary

## 2018-03-08 ENCOUNTER — Encounter: Payer: Self-pay | Admitting: Internal Medicine

## 2018-03-08 ENCOUNTER — Encounter: Payer: Self-pay | Admitting: Physical Medicine & Rehabilitation

## 2018-03-08 ENCOUNTER — Ambulatory Visit (INDEPENDENT_AMBULATORY_CARE_PROVIDER_SITE_OTHER): Payer: BLUE CROSS/BLUE SHIELD | Admitting: Internal Medicine

## 2018-03-08 VITALS — BP 124/80 | HR 72 | Temp 98.3°F | Wt 139.0 lb

## 2018-03-08 DIAGNOSIS — F0630 Mood disorder due to known physiological condition, unspecified: Secondary | ICD-10-CM

## 2018-03-08 DIAGNOSIS — E7800 Pure hypercholesterolemia, unspecified: Secondary | ICD-10-CM | POA: Diagnosis not present

## 2018-03-08 DIAGNOSIS — J45909 Unspecified asthma, uncomplicated: Secondary | ICD-10-CM | POA: Diagnosis not present

## 2018-03-08 DIAGNOSIS — B20 Human immunodeficiency virus [HIV] disease: Secondary | ICD-10-CM

## 2018-03-08 DIAGNOSIS — G43909 Migraine, unspecified, not intractable, without status migrainosus: Secondary | ICD-10-CM | POA: Diagnosis not present

## 2018-03-08 DIAGNOSIS — R51 Headache: Secondary | ICD-10-CM | POA: Diagnosis not present

## 2018-03-08 DIAGNOSIS — I69354 Hemiplegia and hemiparesis following cerebral infarction affecting left non-dominant side: Secondary | ICD-10-CM | POA: Diagnosis not present

## 2018-03-08 DIAGNOSIS — I69318 Other symptoms and signs involving cognitive functions following cerebral infarction: Secondary | ICD-10-CM | POA: Diagnosis not present

## 2018-03-08 DIAGNOSIS — I69322 Dysarthria following cerebral infarction: Secondary | ICD-10-CM | POA: Diagnosis not present

## 2018-03-08 DIAGNOSIS — I10 Essential (primary) hypertension: Secondary | ICD-10-CM | POA: Diagnosis not present

## 2018-03-08 DIAGNOSIS — E1142 Type 2 diabetes mellitus with diabetic polyneuropathy: Secondary | ICD-10-CM | POA: Diagnosis not present

## 2018-03-08 DIAGNOSIS — K5190 Ulcerative colitis, unspecified, without complications: Secondary | ICD-10-CM | POA: Diagnosis not present

## 2018-03-08 DIAGNOSIS — I69398 Other sequelae of cerebral infarction: Secondary | ICD-10-CM

## 2018-03-08 DIAGNOSIS — E1165 Type 2 diabetes mellitus with hyperglycemia: Secondary | ICD-10-CM

## 2018-03-08 DIAGNOSIS — I69392 Facial weakness following cerebral infarction: Secondary | ICD-10-CM | POA: Diagnosis not present

## 2018-03-08 DIAGNOSIS — R519 Headache, unspecified: Secondary | ICD-10-CM

## 2018-03-08 DIAGNOSIS — I6312 Cerebral infarction due to embolism of basilar artery: Secondary | ICD-10-CM

## 2018-03-08 DIAGNOSIS — R1310 Dysphagia, unspecified: Secondary | ICD-10-CM | POA: Diagnosis not present

## 2018-03-08 DIAGNOSIS — E785 Hyperlipidemia, unspecified: Secondary | ICD-10-CM | POA: Diagnosis not present

## 2018-03-08 DIAGNOSIS — Z7982 Long term (current) use of aspirin: Secondary | ICD-10-CM | POA: Diagnosis not present

## 2018-03-08 DIAGNOSIS — I69391 Dysphagia following cerebral infarction: Secondary | ICD-10-CM | POA: Diagnosis not present

## 2018-03-08 MED ORDER — TOPIRAMATE 100 MG PO TABS: 100.0000 mg | ORAL_TABLET | Freq: Every evening | ORAL | 2 refills | Status: DC | PRN

## 2018-03-08 MED ORDER — GLIPIZIDE 10 MG PO TABS: 10.0000 mg | ORAL_TABLET | Freq: Two times a day (BID) | ORAL | 3 refills | Status: DC

## 2018-03-09 ENCOUNTER — Telehealth: Payer: Self-pay

## 2018-03-09 DIAGNOSIS — Z7982 Long term (current) use of aspirin: Secondary | ICD-10-CM | POA: Diagnosis not present

## 2018-03-09 DIAGNOSIS — J45909 Unspecified asthma, uncomplicated: Secondary | ICD-10-CM | POA: Diagnosis not present

## 2018-03-09 DIAGNOSIS — E785 Hyperlipidemia, unspecified: Secondary | ICD-10-CM | POA: Diagnosis not present

## 2018-03-09 DIAGNOSIS — I69322 Dysarthria following cerebral infarction: Secondary | ICD-10-CM | POA: Diagnosis not present

## 2018-03-09 DIAGNOSIS — B20 Human immunodeficiency virus [HIV] disease: Secondary | ICD-10-CM | POA: Diagnosis not present

## 2018-03-09 DIAGNOSIS — I10 Essential (primary) hypertension: Secondary | ICD-10-CM | POA: Diagnosis not present

## 2018-03-09 DIAGNOSIS — G43909 Migraine, unspecified, not intractable, without status migrainosus: Secondary | ICD-10-CM | POA: Diagnosis not present

## 2018-03-09 DIAGNOSIS — I69354 Hemiplegia and hemiparesis following cerebral infarction affecting left non-dominant side: Secondary | ICD-10-CM | POA: Diagnosis not present

## 2018-03-09 DIAGNOSIS — I69318 Other symptoms and signs involving cognitive functions following cerebral infarction: Secondary | ICD-10-CM | POA: Diagnosis not present

## 2018-03-09 DIAGNOSIS — K5190 Ulcerative colitis, unspecified, without complications: Secondary | ICD-10-CM | POA: Diagnosis not present

## 2018-03-09 DIAGNOSIS — R1310 Dysphagia, unspecified: Secondary | ICD-10-CM | POA: Diagnosis not present

## 2018-03-09 DIAGNOSIS — I69391 Dysphagia following cerebral infarction: Secondary | ICD-10-CM | POA: Diagnosis not present

## 2018-03-09 DIAGNOSIS — E1142 Type 2 diabetes mellitus with diabetic polyneuropathy: Secondary | ICD-10-CM | POA: Diagnosis not present

## 2018-03-09 NOTE — Telephone Encounter (Signed)
Jonna Coup, ST/[facility] called requesting verbal orders for 2wk3 for ST. Approved orders per discharge note.

## 2018-03-09 NOTE — Telephone Encounter (Signed)
Copied from [place] [id number]. Topic: General - Other >> Mar 09, 2018  2:08 PM Keene Breath wrote: Reason for CRM: Patient called to request a prescription for her meter.  Patient needs the Free Style Strips for the meter.  Please advise.  CB# [phone].

## 2018-03-10 ENCOUNTER — Encounter: Payer: Self-pay | Admitting: Internal Medicine

## 2018-03-10 DIAGNOSIS — R1310 Dysphagia, unspecified: Secondary | ICD-10-CM | POA: Diagnosis not present

## 2018-03-10 DIAGNOSIS — Z7982 Long term (current) use of aspirin: Secondary | ICD-10-CM | POA: Diagnosis not present

## 2018-03-10 DIAGNOSIS — B20 Human immunodeficiency virus [HIV] disease: Secondary | ICD-10-CM | POA: Diagnosis not present

## 2018-03-10 DIAGNOSIS — K5190 Ulcerative colitis, unspecified, without complications: Secondary | ICD-10-CM | POA: Diagnosis not present

## 2018-03-10 DIAGNOSIS — I10 Essential (primary) hypertension: Secondary | ICD-10-CM | POA: Diagnosis not present

## 2018-03-10 DIAGNOSIS — E1142 Type 2 diabetes mellitus with diabetic polyneuropathy: Secondary | ICD-10-CM | POA: Diagnosis not present

## 2018-03-10 DIAGNOSIS — I69354 Hemiplegia and hemiparesis following cerebral infarction affecting left non-dominant side: Secondary | ICD-10-CM | POA: Diagnosis not present

## 2018-03-10 DIAGNOSIS — E785 Hyperlipidemia, unspecified: Secondary | ICD-10-CM | POA: Diagnosis not present

## 2018-03-10 DIAGNOSIS — I69318 Other symptoms and signs involving cognitive functions following cerebral infarction: Secondary | ICD-10-CM | POA: Diagnosis not present

## 2018-03-10 DIAGNOSIS — G43909 Migraine, unspecified, not intractable, without status migrainosus: Secondary | ICD-10-CM | POA: Diagnosis not present

## 2018-03-10 DIAGNOSIS — J45909 Unspecified asthma, uncomplicated: Secondary | ICD-10-CM | POA: Diagnosis not present

## 2018-03-10 DIAGNOSIS — I69391 Dysphagia following cerebral infarction: Secondary | ICD-10-CM | POA: Diagnosis not present

## 2018-03-10 DIAGNOSIS — I69322 Dysarthria following cerebral infarction: Secondary | ICD-10-CM | POA: Diagnosis not present

## 2018-03-10 LAB — HIV GENOSURE(R) MG

## 2018-03-10 LAB — HIV-1 RNA, PCR (GRAPH) RFX/GENO EDI
HIV-1 RNA BY PCR: 113000 {copies}/mL
HIV-1 RNA QUANT, LOG: 5.053 {Log_copies}/mL

## 2018-03-10 LAB — REFLEX TO GENOSURE(R) MG EDI: HIV GenoSure(R): 1

## 2018-03-10 MED ORDER — GLUCOSE BLOOD VI STRP: 1.0000 | ORAL_STRIP | Freq: Two times a day (BID) | 5 refills | Status: DC

## 2018-03-10 NOTE — Telephone Encounter (Signed)
Pt called to f/up on this request.   °

## 2018-03-10 NOTE — Progress Notes (Signed)
Subjective:    Patient ID: [name], female    DOB: 08/22/1955, 62 y.o.   MRN: [id number]  HPI  Pt presents to the clinic today for [facility] Follow up. She went to the ER 8/7, with c/o slurred speech, left arm and left leg weakness. Labs revealed hyperglycemia, hypokalemia. ECG was normal. CTA head and neck was normal. TPA was initiated. She was transferred to [facility] for further treatment. During workup at [facility], her A1C was 9.1%. She had issues with hypoglycemia in the hospital so they stopped her Carollee Massed, because they had her on insulin in the hospital.  MRI brain showed acute small vessel infarct of the right basal ganglia. TTE showed EF > 55% with grade 1 diastolic dysfunction. She was found to be HIV + during workup. Id was consulted and she was started on Biktarvy. She was started on AS and Plavix and transferred to inpatient rehab on 8/13. She got ST, PT and OT daily for 3 hours. She was having issues with her migraine so they increased her Topamax to 100 mg daily. She was discharged home on 8/20. She continues to get ST, OT and PT. She reports she is sleeping okay. Her headaches are present but not as severe. She still has intermittent dizziness. She continues to have mild left facial droop but no difficulty with swallowing or speech. She denies chest pain or shortness of breath. She still has some weakness of mainly her left lower extremity. She is walking without a device. Her biggest concern is the HIV diagnosis. She still does not want to tell her husband and wants to know if it required she do so. Emotionally, she seems to be doing okay. She has follow ups scheduled with Neurology and ID.  Review of Systems  Past Medical History:  Diagnosis Date  . Asthma   . Diabetes mellitus without complication ([place]) 3-54-56   hx. NIDDM-dx. 3 weeks ago  . Hyperlipidemia    hx  . Seasonal allergies   . Ulcerative colitis ([place])     Current Outpatient Medications  Medication Sig Dispense  Refill  . acetaminophen (TYLENOL) 325 MG tablet Take 2 tablets (650 mg total) by mouth every 6 (six) hours as needed for mild pain (or temp > 37.5 C (99.5 F)).    Marland Kitchen albuterol (PROVENTIL HFA;VENTOLIN HFA) 108 (90 Base) MCG/ACT inhaler Inhale 2 puffs into the lungs every 6 (six) hours as needed for wheezing or shortness of breath. 1 Inhaler 0  . atorvastatin (LIPITOR) 40 MG tablet Take 1 tablet (40 mg total) by mouth daily at 6 PM. 30 tablet 0  . bictegravir-emtricitabine-tenofovir AF (BIKTARVY) 50-200-25 MG TABS tablet Take 1 tablet by mouth daily. 30 tablet 1  . clopidogrel (PLAVIX) 75 MG tablet Take 1 tablet (75 mg total) by mouth daily. 30 tablet 11  . potassium chloride SA (K-DUR,KLOR-CON) 20 MEQ tablet Take 1 tablet (20 mEq total) by mouth daily. 30 tablet 0  . sertraline (ZOLOFT) 25 MG tablet Take 1 tablet (25 mg total) by mouth daily. 30 tablet 2  . aspirin EC 81 MG EC tablet Take 1 tablet (81 mg total) by mouth daily. (Patient not taking: Reported on 03/08/2018)    . glipiZIDE (GLUCOTROL) 10 MG tablet Take 1 tablet (10 mg total) by mouth 2 (two) times daily before a meal. 60 tablet 3  . glucose blood (FREESTYLE LITE) test strip 1 each by Other route 2 (two) times daily. 100 each 5  . topiramate (TOPAMAX) 100  MG tablet Take 1 tablet (100 mg total) by mouth at bedtime as needed. 30 tablet 2   No current facility-administered medications for this visit.     Allergies  Allergen Reactions  . Azithromycin Itching    Muscle aches  . Erythromycin Nausea And Vomiting  . Penicillins Nausea And Vomiting    Has patient had a PCN reaction causing immediate rash, facial/tongue/throat swelling, SOB or lightheadedness with hypotension: Yes Has patient had a PCN reaction causing severe rash involving mucus membranes or skin necrosis: No Has patient had a PCN reaction that required hospitalization No Has patient had a PCN reaction occurring within the last 10 years: No If all of the above answers are  "NO", then may proceed with Cephalosporin use.   . Tetracycline Nausea And Vomiting    Family History  Problem Relation Age of Onset  . Hypertension Mother   . Diabetes Mother   . Cancer Father        lung ca  . Seizures Daughter        33yr    Social History   Socioeconomic History  . Marital status: Legally Separated    Spouse name: Not on file  . Number of children: Not on file  . Years of education: Not on file  . Highest education level: Not on file  Occupational History  . Not on file  Social Needs  . Financial resource strain: Not on file  . Food insecurity:    Worry: Not on file    Inability: Not on file  . Transportation needs:    Medical: Not on file    Non-medical: Not on file  Tobacco Use  . Smoking status: Never Smoker  . Smokeless tobacco: Never Used  Substance and Sexual Activity  . Alcohol use: Not Currently    Alcohol/week: 0.0 standard drinks    Comment: occasional  . Drug use: No  . Sexual activity: Not Currently  Lifestyle  . Physical activity:    Days per week: Not on file    Minutes per session: Not on file  . Stress: Not on file  Relationships  . Social connections:    Talks on phone: Not on file    Gets together: Not on file    Attends religious service: Not on file    Active member of club or organization: Not on file    Attends meetings of clubs or organizations: Not on file    Relationship status: Not on file  . Intimate partner violence:    Fear of current or ex partner: Not on file    Emotionally abused: Not on file    Physically abused: Not on file    Forced sexual activity: Not on file  Other Topics Concern  . Not on file  Social History Narrative  . Not on file     Constitutional: Pt reports intermittent headaches. Denies fever, malaise, fatigue, or abrupt weight changes.  HEENT: Denies eye pain, eye redness, ear pain, ringing in the ears, wax buildup, runny nose, nasal congestion, bloody nose, or sore  throat. Respiratory: Denies difficulty breathing, shortness of breath, cough or sputum production.   Cardiovascular: Denies chest pain, chest tightness, palpitations or swelling in the hands or feet.  Gastrointestinal: Denies abdominal pain, bloating, constipation, diarrhea or blood in the stool.  GU: Denies urgency, frequency, pain with urination, burning sensation, blood in urine, odor or discharge. Musculoskeletal: Pt reports left lower extremity weakness. Denies decrease in range of motion, difficulty with  gait, muscle pain or joint pain and swelling.  Skin: Denies redness, rashes, lesions or ulcercations.  Neurological:Pt reports dizziness. Denies difficulty with memory, difficulty with speech or problems with balance and coordination.  Psych: Denies anxiety, depression, SI/HI.  No other specific complaints in a complete review of systems (except as listed in HPI above).     Objective:   Physical Exam  BP 124/80   Pulse 72   Temp 98.3 F (36.8 C) (Oral)   Wt 139 lb (63 kg)   BMI 25.42 kg/m  Wt Readings from Last 3 Encounters:  03/08/18 139 lb (63 kg)  02/25/18 141 lb 5 oz (64.1 kg)  02/18/18 149 lb 4 oz (67.7 kg)    General: Appears her stated age, well developed, well nourished in NAD. Cardiovascular: Normal rate and rhythm. S1,S2 noted.  No murmur, rubs or gallops noted. No JVD or BLE edema. No carotid bruits noted. Pulmonary/Chest: Normal effort and positive vesicular breath sounds. No respiratory distress. No wheezes, rales or ronchi noted.  Musculoskeletal: Strength 4/5 LUE, 4/5 LLE. Gait slow, steady without device. Neurological: Alert and oriented. Coordination normal.  Psychiatric: Mood and affect normal. Behavior is normal. Judgment and thought content normal.     BMET    Component Value Date/Time   NA 139 03/02/2018 0641   NA 138 06/23/2014 0503   K 3.2 (L) 03/02/2018 0641   K 4.2 06/23/2014 0503   CL 106 03/02/2018 0641   CL 104 06/23/2014 0503   CO2 24  03/02/2018 0641   CO2 27 06/23/2014 0503   GLUCOSE 133 (H) 03/02/2018 0641   GLUCOSE 246 (H) 06/23/2014 0503   BUN 14 03/02/2018 0641   BUN 17 06/23/2014 0503   CREATININE 1.17 (H) 03/02/2018 0641   CREATININE 0.86 06/23/2014 0503   CALCIUM 9.1 03/02/2018 0641   CALCIUM 8.7 06/23/2014 0503   GFRNONAA 49 (L) 03/02/2018 0641   GFRNONAA >60 06/23/2014 0503   GFRAA 57 (L) 03/02/2018 0641   GFRAA >60 06/23/2014 0503    Lipid Panel     Component Value Date/Time   CHOL 243 (H) 02/19/2018 0301   CHOL 211 (H) 06/22/2014 1432   TRIG 349 (H) 02/19/2018 0301   TRIG 473 (H) 06/22/2014 1432   HDL 37 (L) 02/19/2018 0301   HDL 42 06/22/2014 1432   CHOLHDL 6.6 02/19/2018 0301   VLDL 70 (H) 02/19/2018 0301   VLDL SEE COMMENT 06/22/2014 1432   LDLCALC 136 (H) 02/19/2018 0301   LDLCALC SEE COMMENT 06/22/2014 1432    CBC    Component Value Date/Time   WBC 4.7 03/02/2018 0641   RBC 4.09 03/02/2018 0641   HGB 11.4 (L) 03/02/2018 0641   HGB 12.2 06/23/2014 0503   HCT 35.8 (L) 03/02/2018 0641   HCT 37.2 06/23/2014 0503   PLT 253 03/02/2018 0641   PLT 251 06/23/2014 0503   MCV 87.5 03/02/2018 0641   MCV 89 06/23/2014 0503   MCH 27.9 03/02/2018 0641   MCHC 31.8 03/02/2018 0641   RDW 12.9 03/02/2018 0641   RDW 13.6 06/23/2014 0503   LYMPHSABS 1.8 02/23/2018 2112   LYMPHSABS 1.8 06/23/2014 0503   MONOABS 0.7 02/23/2018 2112   MONOABS 0.4 06/23/2014 0503   EOSABS 0.0 02/23/2018 2112   EOSABS 0.0 06/23/2014 0503   BASOSABS 0.0 02/23/2018 2112   BASOSABS 0.0 06/23/2014 0503    Hgb A1C Lab Results  Component Value Date   HGBA1C 9.1 (H) 02/19/2018  Assessment & Plan:   [facility] Follow UP for Stroke, DM 2 Uncontrolled, HLD, Headaches, and HIV +:  Hospital notes, labs and imaging reviewed Continue Atorvastatin, Plavix, Potassium She has not picked up ASA, advised her to do this ASAP She will follow up with neurology as scheduled She will continue Biktarvy and  followup with ID as scheduled Will start her on Zoloft for her moods- update me in 4-6 weeks and let me know how you are doing She is not checking her sugars, no longer taking insulin. She reports she restarted her Glipizide and needs a refill today- repeat A1C in 3 months. Continue Topamax 100 mg daily, refilled today  Follow up with me in 3 months Webb Silversmith, NP

## 2018-03-10 NOTE — Patient Instructions (Signed)

## 2018-03-10 NOTE — Telephone Encounter (Signed)
Rx sent through e-scribe  

## 2018-03-10 NOTE — Telephone Encounter (Signed)
Patient calling back its the freestyle freedom lite.

## 2018-03-10 NOTE — Telephone Encounter (Signed)
Called pt lmovm requesting full meter name for test strips there are many Freestyle meters, freestyle lite, freestyle precision and others as well

## 2018-03-11 DIAGNOSIS — Z7982 Long term (current) use of aspirin: Secondary | ICD-10-CM | POA: Diagnosis not present

## 2018-03-11 DIAGNOSIS — E785 Hyperlipidemia, unspecified: Secondary | ICD-10-CM | POA: Diagnosis not present

## 2018-03-11 DIAGNOSIS — R1310 Dysphagia, unspecified: Secondary | ICD-10-CM | POA: Diagnosis not present

## 2018-03-11 DIAGNOSIS — I69391 Dysphagia following cerebral infarction: Secondary | ICD-10-CM | POA: Diagnosis not present

## 2018-03-11 DIAGNOSIS — K5190 Ulcerative colitis, unspecified, without complications: Secondary | ICD-10-CM | POA: Diagnosis not present

## 2018-03-11 DIAGNOSIS — I10 Essential (primary) hypertension: Secondary | ICD-10-CM | POA: Diagnosis not present

## 2018-03-11 DIAGNOSIS — B20 Human immunodeficiency virus [HIV] disease: Secondary | ICD-10-CM | POA: Diagnosis not present

## 2018-03-11 DIAGNOSIS — I69322 Dysarthria following cerebral infarction: Secondary | ICD-10-CM | POA: Diagnosis not present

## 2018-03-11 DIAGNOSIS — I69354 Hemiplegia and hemiparesis following cerebral infarction affecting left non-dominant side: Secondary | ICD-10-CM | POA: Diagnosis not present

## 2018-03-11 DIAGNOSIS — I69318 Other symptoms and signs involving cognitive functions following cerebral infarction: Secondary | ICD-10-CM | POA: Diagnosis not present

## 2018-03-11 DIAGNOSIS — G43909 Migraine, unspecified, not intractable, without status migrainosus: Secondary | ICD-10-CM | POA: Diagnosis not present

## 2018-03-11 DIAGNOSIS — E1142 Type 2 diabetes mellitus with diabetic polyneuropathy: Secondary | ICD-10-CM | POA: Diagnosis not present

## 2018-03-11 DIAGNOSIS — J45909 Unspecified asthma, uncomplicated: Secondary | ICD-10-CM | POA: Diagnosis not present

## 2018-03-12 ENCOUNTER — Telehealth: Payer: Self-pay | Admitting: *Deleted

## 2018-03-12 DIAGNOSIS — G43909 Migraine, unspecified, not intractable, without status migrainosus: Secondary | ICD-10-CM | POA: Diagnosis not present

## 2018-03-12 DIAGNOSIS — I69318 Other symptoms and signs involving cognitive functions following cerebral infarction: Secondary | ICD-10-CM | POA: Diagnosis not present

## 2018-03-12 DIAGNOSIS — E1142 Type 2 diabetes mellitus with diabetic polyneuropathy: Secondary | ICD-10-CM | POA: Diagnosis not present

## 2018-03-12 DIAGNOSIS — I69354 Hemiplegia and hemiparesis following cerebral infarction affecting left non-dominant side: Secondary | ICD-10-CM | POA: Diagnosis not present

## 2018-03-12 DIAGNOSIS — J45909 Unspecified asthma, uncomplicated: Secondary | ICD-10-CM | POA: Diagnosis not present

## 2018-03-12 DIAGNOSIS — I10 Essential (primary) hypertension: Secondary | ICD-10-CM | POA: Diagnosis not present

## 2018-03-12 DIAGNOSIS — B20 Human immunodeficiency virus [HIV] disease: Secondary | ICD-10-CM | POA: Diagnosis not present

## 2018-03-12 DIAGNOSIS — I69391 Dysphagia following cerebral infarction: Secondary | ICD-10-CM | POA: Diagnosis not present

## 2018-03-12 DIAGNOSIS — I69322 Dysarthria following cerebral infarction: Secondary | ICD-10-CM | POA: Diagnosis not present

## 2018-03-12 DIAGNOSIS — Z7982 Long term (current) use of aspirin: Secondary | ICD-10-CM | POA: Diagnosis not present

## 2018-03-12 DIAGNOSIS — K5190 Ulcerative colitis, unspecified, without complications: Secondary | ICD-10-CM | POA: Diagnosis not present

## 2018-03-12 DIAGNOSIS — R1310 Dysphagia, unspecified: Secondary | ICD-10-CM | POA: Diagnosis not present

## 2018-03-12 DIAGNOSIS — E785 Hyperlipidemia, unspecified: Secondary | ICD-10-CM | POA: Diagnosis not present

## 2018-03-12 NOTE — Telephone Encounter (Signed)
Esther calling for OT orders 1wk1,3wk1,2wk1.  Approval given.

## 2018-03-15 DIAGNOSIS — I69318 Other symptoms and signs involving cognitive functions following cerebral infarction: Secondary | ICD-10-CM | POA: Diagnosis not present

## 2018-03-15 DIAGNOSIS — J45909 Unspecified asthma, uncomplicated: Secondary | ICD-10-CM | POA: Diagnosis not present

## 2018-03-15 DIAGNOSIS — B20 Human immunodeficiency virus [HIV] disease: Secondary | ICD-10-CM | POA: Diagnosis not present

## 2018-03-15 DIAGNOSIS — E785 Hyperlipidemia, unspecified: Secondary | ICD-10-CM | POA: Diagnosis not present

## 2018-03-15 DIAGNOSIS — I69391 Dysphagia following cerebral infarction: Secondary | ICD-10-CM | POA: Diagnosis not present

## 2018-03-15 DIAGNOSIS — I10 Essential (primary) hypertension: Secondary | ICD-10-CM | POA: Diagnosis not present

## 2018-03-15 DIAGNOSIS — I69354 Hemiplegia and hemiparesis following cerebral infarction affecting left non-dominant side: Secondary | ICD-10-CM | POA: Diagnosis not present

## 2018-03-15 DIAGNOSIS — I69322 Dysarthria following cerebral infarction: Secondary | ICD-10-CM | POA: Diagnosis not present

## 2018-03-15 DIAGNOSIS — Z7982 Long term (current) use of aspirin: Secondary | ICD-10-CM | POA: Diagnosis not present

## 2018-03-15 DIAGNOSIS — E1142 Type 2 diabetes mellitus with diabetic polyneuropathy: Secondary | ICD-10-CM | POA: Diagnosis not present

## 2018-03-15 DIAGNOSIS — R1310 Dysphagia, unspecified: Secondary | ICD-10-CM | POA: Diagnosis not present

## 2018-03-15 DIAGNOSIS — K5190 Ulcerative colitis, unspecified, without complications: Secondary | ICD-10-CM | POA: Diagnosis not present

## 2018-03-15 DIAGNOSIS — G43909 Migraine, unspecified, not intractable, without status migrainosus: Secondary | ICD-10-CM | POA: Diagnosis not present

## 2018-03-16 ENCOUNTER — Telehealth: Payer: Self-pay

## 2018-03-16 DIAGNOSIS — Z7982 Long term (current) use of aspirin: Secondary | ICD-10-CM | POA: Diagnosis not present

## 2018-03-16 DIAGNOSIS — K5190 Ulcerative colitis, unspecified, without complications: Secondary | ICD-10-CM | POA: Diagnosis not present

## 2018-03-16 DIAGNOSIS — R1310 Dysphagia, unspecified: Secondary | ICD-10-CM | POA: Diagnosis not present

## 2018-03-16 DIAGNOSIS — I10 Essential (primary) hypertension: Secondary | ICD-10-CM | POA: Diagnosis not present

## 2018-03-16 DIAGNOSIS — I69322 Dysarthria following cerebral infarction: Secondary | ICD-10-CM | POA: Diagnosis not present

## 2018-03-16 DIAGNOSIS — E785 Hyperlipidemia, unspecified: Secondary | ICD-10-CM | POA: Diagnosis not present

## 2018-03-16 DIAGNOSIS — G43909 Migraine, unspecified, not intractable, without status migrainosus: Secondary | ICD-10-CM | POA: Diagnosis not present

## 2018-03-16 DIAGNOSIS — I69354 Hemiplegia and hemiparesis following cerebral infarction affecting left non-dominant side: Secondary | ICD-10-CM | POA: Diagnosis not present

## 2018-03-16 DIAGNOSIS — J45909 Unspecified asthma, uncomplicated: Secondary | ICD-10-CM | POA: Diagnosis not present

## 2018-03-16 DIAGNOSIS — B20 Human immunodeficiency virus [HIV] disease: Secondary | ICD-10-CM | POA: Diagnosis not present

## 2018-03-16 DIAGNOSIS — I69318 Other symptoms and signs involving cognitive functions following cerebral infarction: Secondary | ICD-10-CM | POA: Diagnosis not present

## 2018-03-16 DIAGNOSIS — I69391 Dysphagia following cerebral infarction: Secondary | ICD-10-CM | POA: Diagnosis not present

## 2018-03-16 DIAGNOSIS — E1142 Type 2 diabetes mellitus with diabetic polyneuropathy: Secondary | ICD-10-CM | POA: Diagnosis not present

## 2018-03-16 NOTE — Telephone Encounter (Signed)
Copied from [place] [id number]. Topic: General - Other >> Mar 16, 2018  8:26 AM Oneta Rack wrote: Relation to pt: self  Call back number: [phone]   Reason for call:  Patient states brother in law passed away and she would like to travel to [place] on Wednesday, patient states since she experienced a stroke last week, patient seeking approval, please advise.

## 2018-03-17 ENCOUNTER — Telehealth: Payer: Self-pay | Admitting: *Deleted

## 2018-03-17 DIAGNOSIS — E785 Hyperlipidemia, unspecified: Secondary | ICD-10-CM | POA: Diagnosis not present

## 2018-03-17 DIAGNOSIS — I69322 Dysarthria following cerebral infarction: Secondary | ICD-10-CM | POA: Diagnosis not present

## 2018-03-17 DIAGNOSIS — I69318 Other symptoms and signs involving cognitive functions following cerebral infarction: Secondary | ICD-10-CM | POA: Diagnosis not present

## 2018-03-17 DIAGNOSIS — Z7982 Long term (current) use of aspirin: Secondary | ICD-10-CM | POA: Diagnosis not present

## 2018-03-17 DIAGNOSIS — J45909 Unspecified asthma, uncomplicated: Secondary | ICD-10-CM | POA: Diagnosis not present

## 2018-03-17 DIAGNOSIS — R1310 Dysphagia, unspecified: Secondary | ICD-10-CM | POA: Diagnosis not present

## 2018-03-17 DIAGNOSIS — E1142 Type 2 diabetes mellitus with diabetic polyneuropathy: Secondary | ICD-10-CM | POA: Diagnosis not present

## 2018-03-17 DIAGNOSIS — I10 Essential (primary) hypertension: Secondary | ICD-10-CM | POA: Diagnosis not present

## 2018-03-17 DIAGNOSIS — I69391 Dysphagia following cerebral infarction: Secondary | ICD-10-CM | POA: Diagnosis not present

## 2018-03-17 DIAGNOSIS — K5190 Ulcerative colitis, unspecified, without complications: Secondary | ICD-10-CM | POA: Diagnosis not present

## 2018-03-17 DIAGNOSIS — B20 Human immunodeficiency virus [HIV] disease: Secondary | ICD-10-CM | POA: Diagnosis not present

## 2018-03-17 DIAGNOSIS — G43909 Migraine, unspecified, not intractable, without status migrainosus: Secondary | ICD-10-CM | POA: Diagnosis not present

## 2018-03-17 DIAGNOSIS — I69354 Hemiplegia and hemiparesis following cerebral infarction affecting left non-dominant side: Secondary | ICD-10-CM | POA: Diagnosis not present

## 2018-03-17 NOTE — Telephone Encounter (Signed)
[name] called to  Let us know that Karisha cancelled 2 Wade appts last week and she will be needing to see her next week. No call back needed.

## 2018-03-18 ENCOUNTER — Inpatient Hospital Stay: Payer: Self-pay | Admitting: Internal Medicine

## 2018-03-22 DIAGNOSIS — K5190 Ulcerative colitis, unspecified, without complications: Secondary | ICD-10-CM | POA: Diagnosis not present

## 2018-03-22 DIAGNOSIS — I69354 Hemiplegia and hemiparesis following cerebral infarction affecting left non-dominant side: Secondary | ICD-10-CM | POA: Diagnosis not present

## 2018-03-22 DIAGNOSIS — J45909 Unspecified asthma, uncomplicated: Secondary | ICD-10-CM | POA: Diagnosis not present

## 2018-03-22 DIAGNOSIS — E1142 Type 2 diabetes mellitus with diabetic polyneuropathy: Secondary | ICD-10-CM | POA: Diagnosis not present

## 2018-03-22 DIAGNOSIS — I69318 Other symptoms and signs involving cognitive functions following cerebral infarction: Secondary | ICD-10-CM | POA: Diagnosis not present

## 2018-03-22 DIAGNOSIS — I10 Essential (primary) hypertension: Secondary | ICD-10-CM | POA: Diagnosis not present

## 2018-03-22 DIAGNOSIS — I69322 Dysarthria following cerebral infarction: Secondary | ICD-10-CM | POA: Diagnosis not present

## 2018-03-22 DIAGNOSIS — G43909 Migraine, unspecified, not intractable, without status migrainosus: Secondary | ICD-10-CM | POA: Diagnosis not present

## 2018-03-22 DIAGNOSIS — E785 Hyperlipidemia, unspecified: Secondary | ICD-10-CM | POA: Diagnosis not present

## 2018-03-22 DIAGNOSIS — R1310 Dysphagia, unspecified: Secondary | ICD-10-CM | POA: Diagnosis not present

## 2018-03-22 DIAGNOSIS — B20 Human immunodeficiency virus [HIV] disease: Secondary | ICD-10-CM | POA: Diagnosis not present

## 2018-03-22 DIAGNOSIS — I69391 Dysphagia following cerebral infarction: Secondary | ICD-10-CM | POA: Diagnosis not present

## 2018-03-22 DIAGNOSIS — Z7982 Long term (current) use of aspirin: Secondary | ICD-10-CM | POA: Diagnosis not present

## 2018-03-23 ENCOUNTER — Telehealth: Payer: Self-pay | Admitting: *Deleted

## 2018-03-23 DIAGNOSIS — I639 Cerebral infarction, unspecified: Secondary | ICD-10-CM

## 2018-03-23 DIAGNOSIS — K5190 Ulcerative colitis, unspecified, without complications: Secondary | ICD-10-CM | POA: Diagnosis not present

## 2018-03-23 DIAGNOSIS — I10 Essential (primary) hypertension: Secondary | ICD-10-CM | POA: Diagnosis not present

## 2018-03-23 DIAGNOSIS — E1142 Type 2 diabetes mellitus with diabetic polyneuropathy: Secondary | ICD-10-CM | POA: Diagnosis not present

## 2018-03-23 DIAGNOSIS — J45909 Unspecified asthma, uncomplicated: Secondary | ICD-10-CM | POA: Diagnosis not present

## 2018-03-23 DIAGNOSIS — I69322 Dysarthria following cerebral infarction: Secondary | ICD-10-CM | POA: Diagnosis not present

## 2018-03-23 DIAGNOSIS — R1310 Dysphagia, unspecified: Secondary | ICD-10-CM | POA: Diagnosis not present

## 2018-03-23 DIAGNOSIS — B20 Human immunodeficiency virus [HIV] disease: Secondary | ICD-10-CM | POA: Diagnosis not present

## 2018-03-23 DIAGNOSIS — I69318 Other symptoms and signs involving cognitive functions following cerebral infarction: Secondary | ICD-10-CM | POA: Diagnosis not present

## 2018-03-23 DIAGNOSIS — I69391 Dysphagia following cerebral infarction: Secondary | ICD-10-CM | POA: Diagnosis not present

## 2018-03-23 DIAGNOSIS — G43909 Migraine, unspecified, not intractable, without status migrainosus: Secondary | ICD-10-CM | POA: Diagnosis not present

## 2018-03-23 DIAGNOSIS — E785 Hyperlipidemia, unspecified: Secondary | ICD-10-CM | POA: Diagnosis not present

## 2018-03-23 DIAGNOSIS — I69354 Hemiplegia and hemiparesis following cerebral infarction affecting left non-dominant side: Secondary | ICD-10-CM | POA: Diagnosis not present

## 2018-03-23 DIAGNOSIS — Z7982 Long term (current) use of aspirin: Secondary | ICD-10-CM | POA: Diagnosis not present

## 2018-03-23 NOTE — Telephone Encounter (Signed)
Sherlynn Stalls, OT, [facility]  left a message asking for patient to be referred to outpatient OT at [facility]. She states patient is doing great. No longer needing PT or ST services. Does need to continue OT in outpatient setting.

## 2018-03-24 DIAGNOSIS — E1142 Type 2 diabetes mellitus with diabetic polyneuropathy: Secondary | ICD-10-CM | POA: Diagnosis not present

## 2018-03-24 DIAGNOSIS — I69318 Other symptoms and signs involving cognitive functions following cerebral infarction: Secondary | ICD-10-CM | POA: Diagnosis not present

## 2018-03-24 DIAGNOSIS — I10 Essential (primary) hypertension: Secondary | ICD-10-CM | POA: Diagnosis not present

## 2018-03-24 DIAGNOSIS — Z7982 Long term (current) use of aspirin: Secondary | ICD-10-CM | POA: Diagnosis not present

## 2018-03-24 DIAGNOSIS — I69354 Hemiplegia and hemiparesis following cerebral infarction affecting left non-dominant side: Secondary | ICD-10-CM | POA: Diagnosis not present

## 2018-03-24 DIAGNOSIS — I69391 Dysphagia following cerebral infarction: Secondary | ICD-10-CM | POA: Diagnosis not present

## 2018-03-24 DIAGNOSIS — G43909 Migraine, unspecified, not intractable, without status migrainosus: Secondary | ICD-10-CM | POA: Diagnosis not present

## 2018-03-24 DIAGNOSIS — R1310 Dysphagia, unspecified: Secondary | ICD-10-CM | POA: Diagnosis not present

## 2018-03-24 DIAGNOSIS — K5190 Ulcerative colitis, unspecified, without complications: Secondary | ICD-10-CM | POA: Diagnosis not present

## 2018-03-24 DIAGNOSIS — J45909 Unspecified asthma, uncomplicated: Secondary | ICD-10-CM | POA: Diagnosis not present

## 2018-03-24 DIAGNOSIS — I69322 Dysarthria following cerebral infarction: Secondary | ICD-10-CM | POA: Diagnosis not present

## 2018-03-24 DIAGNOSIS — E785 Hyperlipidemia, unspecified: Secondary | ICD-10-CM | POA: Diagnosis not present

## 2018-03-24 DIAGNOSIS — B20 Human immunodeficiency virus [HIV] disease: Secondary | ICD-10-CM | POA: Diagnosis not present

## 2018-03-24 NOTE — Telephone Encounter (Signed)
Ok referral made

## 2018-03-24 NOTE — Telephone Encounter (Signed)
Contacted [facility] and notified as requested

## 2018-03-25 DIAGNOSIS — J45909 Unspecified asthma, uncomplicated: Secondary | ICD-10-CM | POA: Diagnosis not present

## 2018-03-25 DIAGNOSIS — K5190 Ulcerative colitis, unspecified, without complications: Secondary | ICD-10-CM | POA: Diagnosis not present

## 2018-03-25 DIAGNOSIS — E785 Hyperlipidemia, unspecified: Secondary | ICD-10-CM | POA: Diagnosis not present

## 2018-03-25 DIAGNOSIS — I69322 Dysarthria following cerebral infarction: Secondary | ICD-10-CM | POA: Diagnosis not present

## 2018-03-25 DIAGNOSIS — I69354 Hemiplegia and hemiparesis following cerebral infarction affecting left non-dominant side: Secondary | ICD-10-CM | POA: Diagnosis not present

## 2018-03-25 DIAGNOSIS — I69318 Other symptoms and signs involving cognitive functions following cerebral infarction: Secondary | ICD-10-CM | POA: Diagnosis not present

## 2018-03-25 DIAGNOSIS — E1142 Type 2 diabetes mellitus with diabetic polyneuropathy: Secondary | ICD-10-CM | POA: Diagnosis not present

## 2018-03-25 DIAGNOSIS — Z7982 Long term (current) use of aspirin: Secondary | ICD-10-CM | POA: Diagnosis not present

## 2018-03-25 DIAGNOSIS — G43909 Migraine, unspecified, not intractable, without status migrainosus: Secondary | ICD-10-CM | POA: Diagnosis not present

## 2018-03-25 DIAGNOSIS — I69391 Dysphagia following cerebral infarction: Secondary | ICD-10-CM | POA: Diagnosis not present

## 2018-03-25 DIAGNOSIS — R1310 Dysphagia, unspecified: Secondary | ICD-10-CM | POA: Diagnosis not present

## 2018-03-25 DIAGNOSIS — B20 Human immunodeficiency virus [HIV] disease: Secondary | ICD-10-CM | POA: Diagnosis not present

## 2018-03-25 DIAGNOSIS — I10 Essential (primary) hypertension: Secondary | ICD-10-CM | POA: Diagnosis not present

## 2018-03-29 ENCOUNTER — Encounter: Payer: Self-pay | Admitting: Physical Medicine & Rehabilitation

## 2018-03-29 ENCOUNTER — Encounter
Payer: BLUE CROSS/BLUE SHIELD | Attending: Physical Medicine & Rehabilitation | Admitting: Physical Medicine & Rehabilitation

## 2018-03-29 VITALS — BP 133/83 | HR 74 | Ht 62.0 in | Wt 140.6 lb

## 2018-03-29 DIAGNOSIS — Z8709 Personal history of other diseases of the respiratory system: Secondary | ICD-10-CM | POA: Diagnosis not present

## 2018-03-29 DIAGNOSIS — I69828 Other speech and language deficits following other cerebrovascular disease: Secondary | ICD-10-CM | POA: Diagnosis not present

## 2018-03-29 DIAGNOSIS — Z9049 Acquired absence of other specified parts of digestive tract: Secondary | ICD-10-CM | POA: Insufficient documentation

## 2018-03-29 DIAGNOSIS — M25562 Pain in left knee: Secondary | ICD-10-CM | POA: Diagnosis not present

## 2018-03-29 DIAGNOSIS — F329 Major depressive disorder, single episode, unspecified: Secondary | ICD-10-CM | POA: Diagnosis not present

## 2018-03-29 DIAGNOSIS — Z8249 Family history of ischemic heart disease and other diseases of the circulatory system: Secondary | ICD-10-CM | POA: Diagnosis not present

## 2018-03-29 DIAGNOSIS — Z833 Family history of diabetes mellitus: Secondary | ICD-10-CM | POA: Diagnosis not present

## 2018-03-29 DIAGNOSIS — Z9889 Other specified postprocedural states: Secondary | ICD-10-CM | POA: Insufficient documentation

## 2018-03-29 DIAGNOSIS — I639 Cerebral infarction, unspecified: Secondary | ICD-10-CM | POA: Diagnosis not present

## 2018-03-29 DIAGNOSIS — M792 Neuralgia and neuritis, unspecified: Secondary | ICD-10-CM

## 2018-03-29 DIAGNOSIS — B20 Human immunodeficiency virus [HIV] disease: Secondary | ICD-10-CM | POA: Insufficient documentation

## 2018-03-29 DIAGNOSIS — G8194 Hemiplegia, unspecified affecting left nondominant side: Secondary | ICD-10-CM | POA: Diagnosis not present

## 2018-03-29 DIAGNOSIS — I6381 Other cerebral infarction due to occlusion or stenosis of small artery: Secondary | ICD-10-CM

## 2018-03-29 DIAGNOSIS — E1142 Type 2 diabetes mellitus with diabetic polyneuropathy: Secondary | ICD-10-CM | POA: Diagnosis not present

## 2018-03-29 DIAGNOSIS — Z801 Family history of malignant neoplasm of trachea, bronchus and lung: Secondary | ICD-10-CM | POA: Diagnosis not present

## 2018-03-29 DIAGNOSIS — I69854 Hemiplegia and hemiparesis following other cerebrovascular disease affecting left non-dominant side: Secondary | ICD-10-CM | POA: Diagnosis not present

## 2018-03-29 DIAGNOSIS — R51 Headache: Secondary | ICD-10-CM | POA: Diagnosis not present

## 2018-03-29 MED ORDER — DICLOFENAC SODIUM 1 % TD GEL: 1.0000 "application " | Freq: Three times a day (TID) | TRANSDERMAL | 4 refills | Status: DC

## 2018-03-29 MED ORDER — CITALOPRAM HYDROBROMIDE 10 MG PO TABS: 10.0000 mg | ORAL_TABLET | Freq: Every day | ORAL | 2 refills | Status: DC

## 2018-03-29 MED ORDER — TOPIRAMATE 50 MG PO TABS: 50.0000 mg | ORAL_TABLET | Freq: Every day | ORAL | 2 refills | Status: DC

## 2018-03-29 NOTE — Patient Instructions (Addendum)
PLEASE FEEL FREE TO CALL OUR OFFICE WITH ANY PROBLEMS OR QUESTIONS ([phone])   YOU MAY DRIVE ON YOUR OWN. LOCALLY AND DURING THE DAY

## 2018-03-29 NOTE — Progress Notes (Signed)
Subjective:    Patient ID: [name], female    DOB: April 13, 1956, 62 y.o.   MRN: [id number]  HPI   [name] is here in follw up of her right BG infarct. HH therapy has completed, and I made a referral to [facility] for continued OT last week.  She is independent at a household level with her personal care although she still favors the right side of the left due to ongoing weakness in the left upper extremity.  She is walking without a device.  Reports no falls or near misses..   She has continued pain in her left upper extremity which can fluctuate in location.  Pain is often worse at night.  It can affect her sleep.  Also her left knee bothers her with weight bearing. She has had knee pain since coming home from the hospital.   Her mood was a little down this weekend but she states that her mood has been generally under control. She's taking zoloft at night. Sleep is poor.   Pain Inventory Average Pain 5 Pain Right Now 5 My pain is aching  In the last 24 hours, has pain interfered with the following? General activity 5 Relation with others 0 Enjoyment of life 2 What TIME of day is your pain at its worst? all Sleep (in general) Fair  Pain is worse with: walking and bending Pain improves with: . Relief from Meds: .  Mobility walk without assistance  Function what is your job? [employer]  Neuro/Psych weakness spasms depression  Prior Studies Any changes since last visit?  no  Physicians involved in your care Any changes since last visit?  no   Family History  Problem Relation Age of Onset  . Hypertension Mother   . Diabetes Mother   . Cancer Father        lung ca  . Seizures Daughter        66yr   Social History   Socioeconomic History  . Marital status: Legally Separated    Spouse name: Not on file  . Number of children: Not on file  . Years of education: Not on file  . Highest education level: Not on file  Occupational History  . Not on file  Social  Needs  . Financial resource strain: Not on file  . Food insecurity:    Worry: Not on file    Inability: Not on file  . Transportation needs:    Medical: Not on file    Non-medical: Not on file  Tobacco Use  . Smoking status: Never Smoker  . Smokeless tobacco: Never Used  Substance and Sexual Activity  . Alcohol use: Not Currently    Alcohol/week: 0.0 standard drinks    Comment: occasional  . Drug use: No  . Sexual activity: Not Currently  Lifestyle  . Physical activity:    Days per week: Not on file    Minutes per session: Not on file  . Stress: Not on file  Relationships  . Social connections:    Talks on phone: Not on file    Gets together: Not on file    Attends religious service: Not on file    Active member of club or organization: Not on file    Attends meetings of clubs or organizations: Not on file    Relationship status: Not on file  Other Topics Concern  . Not on file  Social History Narrative  . Not on file   Past Surgical History:  Procedure Laterality Date  . BUNIONECTOMY     bilateral and toe nails of big toes removed  . CESAREAN SECTION  1997/2006  . CHOLECYSTECTOMY N/A 12/01/2012   Procedure: LAPAROSCOPIC CHOLECYSTECTOMY;  Surgeon: Stark Klein, MD;  Location: [facility] ORS;  Service: General;  Laterality: N/A;  . COLONOSCOPY W/ POLYPECTOMY    . EYE SURGERY Bilateral 2013  . TONSILLECTOMY     Past Medical History:  Diagnosis Date  . Asthma   . Diabetes mellitus without complication ([place]) 2-53-66   hx. NIDDM-dx. 3 weeks ago  . Hyperlipidemia    hx  . Seasonal allergies   . Ulcerative colitis ([place])    There were no vitals taken for this visit.  Opioid Risk Score:   Fall Risk Score:  `1  Depression screen PHQ 2/9  Depression screen [facility] 2/9 03/10/2018 04/28/2017 08/02/2014  Decreased Interest 1 1 0  Down, Depressed, Hopeless 1 2 0  PHQ - 2 Score 2 3 0  Altered sleeping 0 2 -  Tired, decreased energy 1 2 -  Change in appetite 0 2 -  Feeling bad  or failure about yourself  0 1 -  Trouble concentrating 0 1 -  Moving slowly or fidgety/restless 0 0 -  Suicidal thoughts 0 0 -  PHQ-9 Score 3 11 -  Difficult doing work/chores - Very difficult -     Review of Systems  Constitutional: Negative.   HENT: Negative.   Eyes: Negative.   Respiratory: Negative.   Cardiovascular: Negative.   Gastrointestinal: Negative.   Endocrine: Negative.   Genitourinary: Negative.   Musculoskeletal: Positive for arthralgias and myalgias.  Skin: Negative.   Allergic/Immunologic: Negative.   Neurological: Positive for weakness.  Hematological: Negative.   Psychiatric/Behavioral: Positive for dysphoric mood.       Objective:   Physical Exam   General: Alert and oriented x 3, No apparent distress HEENT: Head is normocephalic, atraumatic, PERRLA, EOMI, sclera anicteric, oral mucosa pink and moist, dentition intact, ext ear canals clear,  Neck: Supple without JVD or lymphadenopathy Heart: Reg rate and rhythm. No murmurs rubs or gallops Chest: CTA bilaterally without wheezes, rales, or rhonchi; no distress Abdomen: Soft, non-tender, non-distended, bowel sounds positive. Extremities: No clubbing, cyanosis, or edema. Pulses are 2+ Skin: Clean and intact without signs of breakdown Neuro: Pt is cognitively appropriate with normal insight, memory, and awareness. Cranial nerves 2-12 are intact. Sensory exam is normal. Reflexes are 2+ in all 4's. Fine motor coordination is intact. No tremors. Motor function is grossly 5/5 in the right upper and right lower.  Left upper extremity grossly 4- to 4 out of 5 proximal distal.  She is limited slightly more in the shoulder.  Left lower extremity is 4 to 4+ out of 5..  Has some dysesthesias in the left wrist and hand. Musculoskeletal: Full ROM, No pain with AROM or PROM in the neck, trunk, or extremities. Posture appropriate.  Mild crepitus left knee.  Should there is some antalgia with weightbearing on the left lower  extremity.  No effusion appreciated.  No major malalignment seen. Psych: Pt's affect is appropriate. Pt is cooperative        Assessment & Plan:  1.Left-sided weakness with slurred speechsecondary to right basal ganglia infarction secondary to small vessel disease -beginning outpt therapy at [place] her permission to drive today locally and during the day 2. Pain Management: resume topamax 50 mg nightly for headaches.  Discussed that as needed use of Topamax will not give  her ideal control of her headaches.  Also the nightly Topamax may help with some of her left-sided neuropathic stroke pain.  -left knee pain---check xrays   -voltaren gel TID  4. Mood: change zoloft to celexa 17m qhs. May help more with sleep as well 5. Diabetes mellitus with peripheral neuropathy. Per primary  -pt reports improved control 6.Newly diagnosed HIV. Follow-up infectious disease as outpt, afebrile -CD4 count 500 -.Biktarvy50-200--25 mg daily and tolerating            -LFT's normal today, cbc stable                30 minutes of direct patient care time was spent today reviewing her treatment plan and in reviewing labs and imaging  Follow-up with me in about 2 months time.

## 2018-04-01 ENCOUNTER — Telehealth: Payer: Self-pay | Admitting: *Deleted

## 2018-04-01 ENCOUNTER — Other Ambulatory Visit: Payer: Self-pay

## 2018-04-01 ENCOUNTER — Encounter: Payer: Self-pay | Admitting: Infectious Diseases

## 2018-04-01 ENCOUNTER — Ambulatory Visit (INDEPENDENT_AMBULATORY_CARE_PROVIDER_SITE_OTHER): Payer: BLUE CROSS/BLUE SHIELD | Admitting: Licensed Clinical Social Worker

## 2018-04-01 ENCOUNTER — Encounter: Payer: Self-pay | Admitting: Occupational Therapy

## 2018-04-01 ENCOUNTER — Ambulatory Visit: Payer: BLUE CROSS/BLUE SHIELD | Attending: Physical Medicine & Rehabilitation | Admitting: Occupational Therapy

## 2018-04-01 ENCOUNTER — Ambulatory Visit (INDEPENDENT_AMBULATORY_CARE_PROVIDER_SITE_OTHER): Payer: BLUE CROSS/BLUE SHIELD | Admitting: Infectious Diseases

## 2018-04-01 VITALS — BP 106/73 | HR 74 | Temp 98.1°F

## 2018-04-01 DIAGNOSIS — R278 Other lack of coordination: Secondary | ICD-10-CM | POA: Diagnosis not present

## 2018-04-01 DIAGNOSIS — I639 Cerebral infarction, unspecified: Secondary | ICD-10-CM

## 2018-04-01 DIAGNOSIS — Z0000 Encounter for general adult medical examination without abnormal findings: Secondary | ICD-10-CM | POA: Insufficient documentation

## 2018-04-01 DIAGNOSIS — B20 Human immunodeficiency virus [HIV] disease: Secondary | ICD-10-CM | POA: Diagnosis not present

## 2018-04-01 DIAGNOSIS — F4321 Adjustment disorder with depressed mood: Secondary | ICD-10-CM | POA: Diagnosis not present

## 2018-04-01 DIAGNOSIS — R279 Unspecified lack of coordination: Secondary | ICD-10-CM | POA: Insufficient documentation

## 2018-04-01 DIAGNOSIS — M6281 Muscle weakness (generalized): Secondary | ICD-10-CM

## 2018-04-01 MED ORDER — BICTEGRAVIR-EMTRICITAB-TENOFOV 50-200-25 MG PO TABS: 1.0000 | ORAL_TABLET | Freq: Every day | ORAL | 5 refills | Status: DC

## 2018-04-01 NOTE — Assessment & Plan Note (Signed)
Struggling with diagnosis. Will have her meet with Rollene Fare today to establish supportive care. Will have her return for ongoing support and education.

## 2018-04-01 NOTE — Progress Notes (Signed)
Patient: [name]  DOB: 1956-06-17 MRN: [id number] PCP: Jearld Fenton, NP   Patient Active Problem List   Diagnosis Date Noted  . Adjustment disorder with depressed mood 04/01/2018  . Healthcare maintenance 04/01/2018  . Essential hypertension 02/23/2018  . Migraine 02/23/2018  . HIV disease ([place])   . Stroke (cerebrum) ([place]) - R basal ganglia d/t small vessel dz 02/18/2018  . HLD (hyperlipidemia) 08/02/2014  . Type 2 diabetes mellitus ([place]) 07/31/2014  . Ulcerative colitis ([place]) 06/26/2008     Subjective:   Chief Complaint  Patient presents with  . New Patient (Initial Visit)    [name] is a 62 y.o. female here to establish care for her HIV infection that was recently diagnosed in the hospital during admission for CVA in August of this year. She has a past medical history of DM, Asthma and this new R basal ganglia stroke. Her screening 4th generation HIV test was positive with positive HIV-1 Ab and VL 118,000 copies, CD4 500. She was last tested > 18yrago prior to this and was negative at that time. At the time she was very upset with the diagnosis and did not feel as if she could tell her husband. There was a period of time that they were separated (2013-2017) and had another partner at that time. She was started on Biktarvy in the hospital and is here for a 4 week follow up today.   Other labs worked up revealed (-) RPR, hypercholesterolemia, (-) HC Ab, (-) HB sAg. Genotype 02/2018 - no significant mutations  Since returning home from the hospital she has been recovering well from stroke standpoint. She still finds that she drools from the left side occasionally but progressing well and now getting physical therapy at home. Able to swallow food/medications well without concern. Got hung up on some food once but not since that time. She Is still very upset with her "status". She feels that the [facility] and making her very stressed out and  feels that it is not good for her health considering her recent stroke. She has not told her husband yet - they have not been involved sexually in over 20 years. MMomohad a female sexual partner about 3 years ago and feels that is when she acquired infection -she is uncertain as to what his HIV status was. She has taken her Biktarvy every day and not missed any doses. She is still hopeful that she may not need this medication the rest of her life. Reports no complaints today suggestive of associated opportunistic infection or advancing HIV disease such as fevers, night sweats, weight loss, anorexia, cough, SOB, nausea, vomiting, diarrhea, headache, sensory changes, lymphadenopathy or oral thrush.    Has not had a mammogram in some time nor a pap smear. No history of abnormal cytologies in the past per her recollection.    Review of Systems  Constitutional: Negative for chills, fever and malaise/fatigue.  HENT: Negative for tinnitus.   Eyes: Negative for blurred vision and photophobia.  Respiratory: Negative for cough and sputum production.   Cardiovascular: Negative for chest pain.  Gastrointestinal: Negative for diarrhea, nausea and vomiting.  Genitourinary: Negative for dysuria.  Skin: Negative for rash.  Neurological: Positive for speech change (slightly slurred since stroke ) and focal weakness (LUE weakness). Negative for headaches.    Past Medical History:  Diagnosis Date  . Asthma   . Diabetes mellitus without complication ([place] 50-17-51  hx. NIDDM-dx. 3 weeks ago  . Hyperlipidemia    hx  . Seasonal allergies   . Ulcerative colitis ([place])     Outpatient Medications Prior to Visit  Medication Sig Dispense Refill  . albuterol (PROVENTIL HFA;VENTOLIN HFA) 108 (90 Base) MCG/ACT inhaler Inhale 2 puffs into the lungs every 6 (six) hours as needed for wheezing or shortness of breath. 1 Inhaler 0  . aspirin EC 81 MG EC tablet Take 1 tablet (81 mg total) by mouth daily.    Marland Kitchen atorvastatin  (LIPITOR) 40 MG tablet Take 1 tablet (40 mg total) by mouth daily at 6 PM. 30 tablet 0  . citalopram (CELEXA) 10 MG tablet Take 1 tablet (10 mg total) by mouth at bedtime. 30 tablet 2  . clopidogrel (PLAVIX) 75 MG tablet Take 1 tablet (75 mg total) by mouth daily. 30 tablet 11  . diclofenac sodium (VOLTAREN) 1 % GEL Apply 1 application topically 3 (three) times daily. Left knee 3 Tube 4  . glipiZIDE (GLUCOTROL) 10 MG tablet Take 1 tablet (10 mg total) by mouth 2 (two) times daily before a meal. 60 tablet 3  . glucose blood (FREESTYLE LITE) test strip 1 each by Other route 2 (two) times daily. 100 each 5  . potassium chloride SA (K-DUR,KLOR-CON) 20 MEQ tablet Take 1 tablet (20 mEq total) by mouth daily. 30 tablet 0  . topiramate (TOPAMAX) 50 MG tablet Take 1 tablet (50 mg total) by mouth at bedtime. 30 tablet 2  . bictegravir-emtricitabine-tenofovir AF (BIKTARVY) 50-200-25 MG TABS tablet Take 1 tablet by mouth daily. 30 tablet 1  . acetaminophen (TYLENOL) 325 MG tablet Take 2 tablets (650 mg total) by mouth every 6 (six) hours as needed for mild pain (or temp > 37.5 C (99.5 F)). (Patient not taking: Reported on 04/01/2018)     No facility-administered medications prior to visit.      Allergies  Allergen Reactions  . Azithromycin Itching    Muscle aches  . Erythromycin Nausea And Vomiting  . Penicillins Nausea And Vomiting    Has patient had a PCN reaction causing immediate rash, facial/tongue/throat swelling, SOB or lightheadedness with hypotension: Yes Has patient had a PCN reaction causing severe rash involving mucus membranes or skin necrosis: No Has patient had a PCN reaction that required hospitalization No Has patient had a PCN reaction occurring within the last 10 years: No If all of the above answers are "NO", then may proceed with Cephalosporin use.   . Tetracycline Nausea And Vomiting    Social History   Tobacco Use  . Smoking status: Never Smoker  . Smokeless tobacco: Never  Used  Substance Use Topics  . Alcohol use: Not Currently    Alcohol/week: 0.0 standard drinks    Comment: occasional  . Drug use: No    Family History  Problem Relation Age of Onset  . Hypertension Mother   . Diabetes Mother   . Cancer Father        lung ca  . Seizures Daughter        52yr    Objective:   Vitals:   04/01/18 1636  BP: 106/73  Pulse: 74  Temp: 98.1 F (36.7 C)   There is no height or weight on file to calculate BMI.  Physical Exam  Constitutional: She is oriented to person, place, and time. She appears well-developed and well-nourished.  Seated comfortably in chair.   HENT:  Mouth/Throat: Oropharynx is clear and moist and mucous membranes are normal.  No oral lesions. Normal dentition. No dental abscesses. No oropharyngeal exudate.  Eyes: Pupils are equal, round, and reactive to light. No scleral icterus.  Cardiovascular: Normal rate, regular rhythm and normal heart sounds.  Pulmonary/Chest: Effort normal and breath sounds normal.  Abdominal: Soft. She exhibits no distension. There is no tenderness.  Musculoskeletal:  Left sided weakness  Lymphadenopathy:    She has no cervical adenopathy.  Neurological: She is alert and oriented to person, place, and time.  Left facial droop   Skin: Skin is warm and dry. No rash noted.  Psychiatric: She has a normal mood and affect. Judgment normal.  Upset during encounter. Frustrated and angry.     Lab Results: Lab Results  Component Value Date   WBC 4.7 03/02/2018   HGB 11.4 (L) 03/02/2018   HCT 35.8 (L) 03/02/2018   MCV 87.5 03/02/2018   PLT 253 03/02/2018    Lab Results  Component Value Date   CREATININE 1.17 (H) 03/02/2018   BUN 14 03/02/2018   NA 139 03/02/2018   K 3.2 (L) 03/02/2018   CL 106 03/02/2018   CO2 24 03/02/2018    Lab Results  Component Value Date   ALT 36 03/02/2018   AST 24 03/02/2018   ALKPHOS 109 03/02/2018   BILITOT 0.8 03/02/2018     Assessment & Plan:   Problem List  Items Addressed This Visit      Cardiovascular and Mediastinum   Stroke (cerebrum) ([place]) - R basal ganglia d/t small vessel dz    Improving with therapy. Taking all medications as prescribed. Follow up with neurology soon.        Other   Adjustment disorder with depressed mood    Struggling with diagnosis. Will have her meet with Rollene Fare today to establish supportive care. Will have her return for ongoing support and education.       Healthcare maintenance    Will order screening mammogram. Pap smear at upcoming appointment with me.       HIV disease ([place]) - Primary    New patient here to establish for HIV care. I discussed with [name] treatment options/side effects, benefits of treatment and long-term outcomes. I discussed how HIV is transmitted and the process of untreated HIV including increased risk for opportunistic infections, cancer, dementia and renal failure. She was counseled on routine HIV care including medication adherence, blood monitoring, necessary vaccines and follow up visits. Counseled regarding safe sex practices including: condom use, partner disclosure, limiting partners. She spent time talking with our pharmacist Cassie regarding successful practices of ART and understands to reach out to our clinic in the future with questions.   General introduction to our clinic and integrated services. She was introduced to [place] and [facility] today.   She has Pharmacist, community and provided a co-pay card for further prescriptions. She has been tolerating her Biktarvy very well with no side effects. Will recheck remaining HIV related labs and reassess VL today to determine medication effect.   I spent greater than 30 minutes with the patient today. Greater than 50% of the time spent face-to-face counseling and coordination of care re: HIV and health maintenance.        Relevant Medications   bictegravir-emtricitabine-tenofovir AF (BIKTARVY) 50-200-25 MG TABS tablet    Other Relevant Orders   Hepatitis B Core Antibody, total   Hepatitis A Ab, Total   HIV-1 RNA quant-no reflex-bld   HLA B*5701   QuantiFERON-TB Gold Plus     Return in  about 2 months (around 06/01/2018).   Janene Madeira, MSN, NP-C [facility] for Infectious [place] Pager: [phone] Office: [phone]  04/01/18  10:29 PM

## 2018-04-01 NOTE — Patient Instructions (Addendum)
Wonderful to meet you today! Happy to hear you are recovering from your stroke well.   Please continue taking your biktarvy once a day every day as you are. We are going to repeat your blood work today to see how good the medication is working.   Please sign up with MyChart to access your labs and set up email communication with our clinic for non-urgent medical concerns.    Please come back to see Colletta Maryland in 2 months for a follow up appointment and a pap smear (30 min appointment).

## 2018-04-01 NOTE — Assessment & Plan Note (Signed)
Improving with therapy. Taking all medications as prescribed. Follow up with neurology soon.

## 2018-04-01 NOTE — Therapy (Addendum)
[place] MAIN [facility] SERVICES [address] [place], [place], [address] Phone: [phone]   Fax:  [phone]  Occupational Therapy Evaluation  Patient Details  Name: [name] MRN: [id number] Date of Birth: Aug 11, 1955 Referring Provider: Alger Simons T   Encounter Date: 04/01/2018  OT End of Session - 04/01/18 1315    Visit Number  1    Number of Visits  24    Date for OT Re-Evaluation  06/24/18    OT Start Time  0915    OT Stop Time  1015    OT Time Calculation (min)  60 min    Activity Tolerance  Patient tolerated treatment well    Behavior During Therapy  [facility] for tasks assessed/performed       Past Medical History:  Diagnosis Date  . Asthma   . Diabetes mellitus without complication ([place]) 7-98-92   hx. NIDDM-dx. 3 weeks ago  . Hyperlipidemia    hx  . Seasonal allergies   . Ulcerative colitis [facility])     Past Surgical History:  Procedure Laterality Date  . BUNIONECTOMY     bilateral and toe nails of big toes removed  . CESAREAN SECTION  1997/2006  . CHOLECYSTECTOMY N/A 12/01/2012   Procedure: LAPAROSCOPIC CHOLECYSTECTOMY;  Surgeon: Stark Klein, MD;  Location: [facility] ORS;  Service: General;  Laterality: N/A;  . COLONOSCOPY W/ POLYPECTOMY    . EYE SURGERY Bilateral 2013  . TONSILLECTOMY      There were no vitals filed for this visit.  Subjective Assessment - 04/01/18 1159    Subjective   Pt reports that she values her independence and does not wish to depend on her husband for daily tasks    Pertinent History  Pt is a 62 y.o. female presenting with LUE weakness and decreased [place] as a result of R basal ganglia CVA on 8/08. Pt was hospitalized for 1 week where she received inpatient therapy and was then discharged home where she received home health therapy for 2 weeks. Prior to CVA, pt was independent for ADLs and IADLs. Currently, pt requires increased assistance, increased time, and/or modified strategies for ADLs and  IADLs.     Patient Stated Goals  Pt wants to be able to do everthing with her L hand that her R hand can do, including making wigs, picking up laundry baskets, manipulating coins for shopping, and grooming hair.    Currently in Pain?  No/denies    Pain Score  5     Pain Location  Arm    Pain Descriptors / Indicators  Aching    Pain Type  Chronic pain    Multiple Pain Sites  No        [facility] OT Assessment - 04/01/18 0924      Assessment   Medical Diagnosis  R BG CVA    Referring Provider  Alger Simons T    Onset Date/Surgical Date  02/18/18    Hand Dominance  Right    Next MD Visit  04/06/2018   Neurologist appt   Prior Therapy  [facility] PT, OT, SLP      Balance Screen   Has the patient fallen in the past 6 months  No    Has the patient had a decrease in activity level because of a fear of falling?   No    Is the patient reluctant to leave their home because of a fear of falling?   No      Home  Environment   Family/patient expects to be discharged to:  Private residence    Living Arrangements  Spouse/significant other    Available Help at Discharge  Family    Type of [place] Access  Level entry    Home Layout  Two level    Bathroom Shower/Tub  Walk-in Mudlogger  Yes    Adaptive equipment  Other (comment)   Social worker  None    Lives With  Spouse      Prior Function   Level of Independence  Independent    Vocation  Full time Administrator, using phone   typing, using phone   Leisure  [place]      ADL   Eating/Feeding  Modified independent    Grooming  Modified independent    Lower Body Bathing  Increased time    Upper Body Dressing  Increased time    Lower Body Dressing  Increased time    Armed forces technical officer  Modified independent    Toileting - Clothing Manipulation  Increased time    Toileting -  Hygiene  Increase time    Electrical engineer  Modified independent      IADL   Prior Level of [place]  Needs to be accompanied on any shopping trip    Prior Level of [place]  Needs help with all home maintenance tasks    Prior Level of Function Meal Prep  Independent    Meal Prep  Able to complete simple warm meal prep    Prior Level of Function Sales executive  Drives own vehicle    Prior Level of Function Medication Managment  Independent    Medication Management  Is responsible for taking medication in correct dosages at correct time    Prior Level of Function Financial Management  Independent      Mobility   Mobility Status  Independent      Written Expression   Dominant Hand  Right    Written Experience  Within Functional Limits      Vision - History   Baseline Vision  Wears contact      Activity Tolerance   Activity Tolerance  --   Tolerates 20 min activity with minimal rest breaks     Cognition   Overall Cognitive Status  Impaired/Different from baseline    Area of Impairment  Memory;Attention;Problem solving      Sensation   Light Touch  Appears Intact    Stereognosis  Appears Intact    Hot/Cold  Appears Intact    Proprioception  Appears Intact    Additional Comments  Sharp/dull impaired by gross assessment      Coordination   Right 9 Hole Peg Test  23 secs    Left 9 Hole Peg Test  3 mins 06 secs      ROM / Strength   AROM / PROM / Strength  AROM;PROM;Strength      AROM   Overall AROM   Deficits    Overall AROM Comments  --   AROM L shoulder abduction 52 degrees     PROM   Overall PROM   Deficits    Overall PROM Comments  L shoulder 52  degrees      Strength   Overall Strength  Deficits    Left Shoulder Flexion  3+/5    Left Shoulder ABduction  3+/5    Right/Left Elbow  Left    Left Elbow Flexion  3+/5    Left Elbow Extension  3+/5    Right/Left Forearm   Left    Left Forearm Pronation  3/5    Left Forearm Supination  3/5    Right/Left Wrist  Left    Left Wrist Flexion  3+/5    Left Wrist Extension  3+/5      Hand Function   Right Hand Gross Grasp  Functional    Right Hand Grip (lbs)  44    Right Hand Lateral Pinch  14 lbs    Right Hand 3 Point Pinch  12 lbs    Left Hand Gross Grasp  Impaired    Left Hand Grip (lbs)  5    Left Hand Lateral Pinch  3 lbs    Left 3 point pinch  2 lbs                      OT Education - 04/01/18 1302    Education Details  OT intial eval, goals    Person(s) Educated  Patient    Methods  Explanation    Comprehension  Verbalized understanding          OT Long Term Goals - 04/01/18 1214      OT LONG TERM GOAL #1   Title  Pt will decreased [place] by 15 secs of speed in order to independently manipulate coins during grocery shopping    Baseline  Pt drops coins when checking out at grocery store; [facility] 3 mins 06 secs    Time  12    Period  Weeks    Status  New    [place] Date  06/24/18      OT LONG TERM GOAL #2   Title  Pt will increase LUE strength by 1 mm level in order to independently pick up laundry baskets during IADLs    Baseline  Pt is unable to pick up laundry baskets; MMT LUE 3+/5    Time  12    Period  Weeks    Status  New    [place] Date  06/24/18      OT LONG TERM GOAL #3   Title  Pt will increase L shoulder ROM by 10 degrees in efficiently bathe and dress UB    Baseline  Pt is unable to use L hand to bathe and dress efficiently; L AROM shoulder abd 52 degrees    Time  12    Period  Weeks    Status  New    Target Date  06/24/18      OT LONG TERM GOAL #4   Title  Pt will increase L lateral pinch by 3# in order to clip nails independently    Baseline  Pt is unable to use L hand to clip nails; L lateral pinch is 3#    Time  12    Period  Weeks    Status  New    Target Date  06/24/18      OT LONG TERM GOAL #5   Title  Pt will increase L 3 point pinch by 4# to be  able to independently button small buttons    Baseline  Pt requires increased time to button clothes using L  hand; L 3 point pinch is 2#    Time  12    Period  Weeks    Status  New    Target Date  06/24/18            Plan - 04/01/18 1803    Clinical Impression Statement  Pt is a 62 yo female that presents with decreased L hand [place], LUE strength, LUE ROM due to R basal ganglia CVA. As a result of the CVA, pt.is unable to participate in daily tasks such as light cleaning, personal grooming, and zip and button clothes, and handle money during shopping. Pt. presented with a sum score of 72/80 on the MAM-20. Pt would benefit from skilled OT services to decrease LUE pain, increase L FMC, increase LUE ROM in order to increase independence during ADLs and IADLs.    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 Pt enjoys singing for leisure however has not been able to do so since experiencing CVA.    Occupational performance deficits (Please refer to evaluation for details):  ADL's;IADL's;Leisure    Rehab Potential  Good    Current Impairments/barriers affecting progress:  Positive: age, motivate, family suppory; Negative:     OT Frequency  2x / week    OT Duration  12 weeks    OT Treatment/Interventions  Self-care/ADL training;Therapeutic exercise;Neuromuscular education;Therapeutic activities;Manual Therapy;Passive range of motion    Clinical Decision Making  Several treatment options, min-mod task modification necessary    Consulted and Agree with Plan of Care  Patient       Patient will benefit from skilled therapeutic intervention in order to improve the following deficits and impairments:  Pain, Decreased cognition, Decreased range of motion, Decreased strength, Impaired perceived functional ability, Impaired UE functional use  Visit Diagnosis: Muscle  weakness (generalized)  Decreased coordination    Problem List Patient Active Problem List   Diagnosis Date Noted  . Depression, major, single episode, mild ([place]) 04/01/2018  . Essential hypertension 02/23/2018  . Migraine 02/23/2018  . HIV disease ([place])   . Stroke (cerebrum) ([place]) - R basal ganglia d/t small vessel dz 02/18/2018  . HLD (hyperlipidemia) 08/02/2014  . Type 2 diabetes mellitus ([place]) 07/31/2014  . Ulcerative colitis ([place]) 06/26/2008    Oliver Hum, OTS 04/01/2018, 6:04 PM   This entire session was performed under direct supervision and direction of a licensed therapist/therapist assistant . I have personally read, edited and approve of the note as written.  Harrel Carina, MS, OTR/L   [place] MAIN [facility] SERVICES [address] [place], [place], [address] Phone: [phone]   Fax:  [phone]  Name: [name] MRN: [id number] Date of Birth: Apr 02, 1956

## 2018-04-01 NOTE — Assessment & Plan Note (Signed)
Will order screening mammogram. Pap smear at upcoming appointment with me.

## 2018-04-01 NOTE — Telephone Encounter (Signed)
Patient asked if there is a way to have her medication in monthly bubble packs.  RN advised that [employer] would be able to package her medication this way, would perhaps be able to deliver.  She would like to switch to [facility] for this benefit.  RN contacted [facility], gave demographic info and allergies as well as current pharmacy.  [facility] will contact [facility] to transfer profile. Landis Gandy, RN

## 2018-04-01 NOTE — Assessment & Plan Note (Signed)
New patient here to establish for HIV care. I discussed with [name] treatment options/side effects, benefits of treatment and long-term outcomes. I discussed how HIV is transmitted and the process of untreated HIV including increased risk for opportunistic infections, cancer, dementia and renal failure. She was counseled on routine HIV care including medication adherence, blood monitoring, necessary vaccines and follow up visits. Counseled regarding safe sex practices including: condom use, partner disclosure, limiting partners. She spent time talking with our pharmacist Cassie regarding successful practices of ART and understands to reach out to our clinic in the future with questions.   General introduction to our clinic and integrated services. She was introduced to [place] and [facility] today.   She has Pharmacist, community and provided a co-pay card for further prescriptions. She has been tolerating her Biktarvy very well with no side effects. Will recheck remaining HIV related labs and reassess VL today to determine medication effect.   I spent greater than 30 minutes with the patient today. Greater than 50% of the time spent face-to-face counseling and coordination of care re: HIV and health maintenance.

## 2018-04-01 NOTE — Progress Notes (Signed)
HPI: [name] is a 62 y.o. female who presents to the [facility] to see Colletta [place] for her HIV infection.  Patient Active Problem List   Diagnosis Date Noted  . Essential hypertension 02/23/2018  . Migraine 02/23/2018  . HIV disease ([place])   . Stroke (cerebrum) ([place]) - R basal ganglia d/t small vessel dz 02/18/2018  . HLD (hyperlipidemia) 08/02/2014  . Type 2 diabetes mellitus ([place]) 07/31/2014  . Ulcerative colitis ([place]) 06/26/2008    Patient's Medications  New Prescriptions   No medications on file  Previous Medications   ACETAMINOPHEN (TYLENOL) 325 MG TABLET    Take 2 tablets (650 mg total) by mouth every 6 (six) hours as needed for mild pain (or temp > 37.5 C (99.5 F)).   ALBUTEROL (PROVENTIL HFA;VENTOLIN HFA) 108 (90 BASE) MCG/ACT INHALER    Inhale 2 puffs into the lungs every 6 (six) hours as needed for wheezing or shortness of breath.   ASPIRIN EC 81 MG EC TABLET    Take 1 tablet (81 mg total) by mouth daily.   ATORVASTATIN (LIPITOR) 40 MG TABLET    Take 1 tablet (40 mg total) by mouth daily at 6 PM.   CITALOPRAM (CELEXA) 10 MG TABLET    Take 1 tablet (10 mg total) by mouth at bedtime.   CLOPIDOGREL (PLAVIX) 75 MG TABLET    Take 1 tablet (75 mg total) by mouth daily.   DICLOFENAC SODIUM (VOLTAREN) 1 % GEL    Apply 1 application topically 3 (three) times daily. Left knee   GLIPIZIDE (GLUCOTROL) 10 MG TABLET    Take 1 tablet (10 mg total) by mouth 2 (two) times daily before a meal.   GLUCOSE BLOOD (FREESTYLE LITE) TEST STRIP    1 each by Other route 2 (two) times daily.   POTASSIUM CHLORIDE SA (K-DUR,KLOR-CON) 20 MEQ TABLET    Take 1 tablet (20 mEq total) by mouth daily.   TOPIRAMATE (TOPAMAX) 50 MG TABLET    Take 1 tablet (50 mg total) by mouth at bedtime.  Modified Medications   Modified Medication Previous Medication   BICTEGRAVIR-EMTRICITABINE-TENOFOVIR AF (BIKTARVY) 50-200-25 MG TABS TABLET bictegravir-emtricitabine-tenofovir AF (BIKTARVY) 50-200-25 MG TABS tablet        Take 1 tablet by mouth daily.    Take 1 tablet by mouth daily.  Discontinued Medications   No medications on file    Allergies: Allergies  Allergen Reactions  . Azithromycin Itching    Muscle aches  . Erythromycin Nausea And Vomiting  . Penicillins Nausea And Vomiting    Has patient had a PCN reaction causing immediate rash, facial/tongue/throat swelling, SOB or lightheadedness with hypotension: Yes Has patient had a PCN reaction causing severe rash involving mucus membranes or skin necrosis: No Has patient had a PCN reaction that required hospitalization No Has patient had a PCN reaction occurring within the last 10 years: No If all of the above answers are "NO", then may proceed with Cephalosporin use.   . Tetracycline Nausea And Vomiting    Past Medical History: Past Medical History:  Diagnosis Date  . Asthma   . Diabetes mellitus without complication ([place]) 6-80-32   hx. NIDDM-dx. 3 weeks ago  . Hyperlipidemia    hx  . Seasonal allergies   . Ulcerative colitis ([place])     Social History: Social History   Socioeconomic History  . Marital status: Legally Separated    Spouse name: Not on file  . Number of children: Not on file  .  Years of education: Not on file  . Highest education level: Not on file  Occupational History  . Not on file  Social Needs  . Financial resource strain: Not on file  . Food insecurity:    Worry: Not on file    Inability: Not on file  . Transportation needs:    Medical: Not on file    Non-medical: Not on file  Tobacco Use  . Smoking status: Never Smoker  . Smokeless tobacco: Never Used  Substance and Sexual Activity  . Alcohol use: Not Currently    Alcohol/week: 0.0 standard drinks    Comment: occasional  . Drug use: No  . Sexual activity: Not Currently  Lifestyle  . Physical activity:    Days per week: Not on file    Minutes per session: Not on file  . Stress: Not on file  Relationships  . Social connections:    Talks  on phone: Not on file    Gets together: Not on file    Attends religious service: Not on file    Active member of club or organization: Not on file    Attends meetings of clubs or organizations: Not on file    Relationship status: Not on file  Other Topics Concern  . Not on file  Social History Narrative  . Not on file    Labs: Lab Results  Component Value Date   HIV1RNAQUANT 118,000 02/19/2018   CD4TABS 500 02/22/2018    RPR and STI Lab Results  Component Value Date   LABRPR Non Reactive 02/22/2018   LABRPR Non Reactive 02/21/2018   LABRPR NON REAC 04/04/2008    STI Results CT  Latest Ref Rng & Units NEGATIVE  04/04/2008 NEGATIVE    Hepatitis B Lab Results  Component Value Date   HEPBSAB Non Reactive 02/19/2018   HEPBSAG Negative 02/19/2018   Hepatitis C No results found for: HEPCAB, HCVRNAPCRQN Hepatitis A No results found for: HAV Lipids: Lab Results  Component Value Date   CHOL 243 (H) 02/19/2018   TRIG 349 (H) 02/19/2018   HDL 37 (L) 02/19/2018   CHOLHDL 6.6 02/19/2018   VLDL 70 (H) 02/19/2018   LDLCALC 136 (H) 02/19/2018    Current HIV Regimen: Biktarvy  Assessment: [name] is here today to follow-up for her newly diagnosed HIV infection.  She started [employer] several weeks ago and is having no issues tolerating it or taking it.  No side effects or issues paying for it or getting it from the pharmacy.  I gave her my card and told her to call me with any questions or concerns.  Plan: - Continue Biktarvy PO once daily  Micahel Omlor L. Safiyyah Vasconez, PharmD, [facility] 04/01/2018, 4:22 PM

## 2018-04-02 ENCOUNTER — Other Ambulatory Visit: Payer: Self-pay | Admitting: Internal Medicine

## 2018-04-02 NOTE — Telephone Encounter (Signed)
Copied from [place] [id number]. Topic: Quick Communication - Rx Refill/Question >> Apr 02, 2018  3:56 PM Blase Mess A wrote: Medication: atorvastatin (LIPITOR) 40 MG tablet [[id number]] , potassium chloride SA (K-DUR,KLOR-CON) 20 MEQ tablet [[id number]]  Has the patient contacted their pharmacy? Yes  (Agent: If no, request that the patient contact the pharmacy for the refill.) (Agent: If yes, when and what did the pharmacy advise?)  Preferred Pharmacy (with phone number or street name): [facility], [place] [address] [place] [address] Phone: [phone] Fax: [phone] Not a 24 hour pharmacy; exact hours not known.    Agent: Please be advised that RX refills may take up to 3 business days. We ask that you follow-up with your pharmacy.

## 2018-04-02 NOTE — Telephone Encounter (Signed)
Pt requesting refills of Atorvastatin and Potassium Chloride that were previously prescribed by a different provider.  LOV:03/08/18 PCP: [facility]: [place]

## 2018-04-05 ENCOUNTER — Ambulatory Visit: Payer: BLUE CROSS/BLUE SHIELD | Admitting: Occupational Therapy

## 2018-04-05 DIAGNOSIS — M6281 Muscle weakness (generalized): Secondary | ICD-10-CM

## 2018-04-05 DIAGNOSIS — R278 Other lack of coordination: Secondary | ICD-10-CM

## 2018-04-05 DIAGNOSIS — R279 Unspecified lack of coordination: Secondary | ICD-10-CM | POA: Diagnosis not present

## 2018-04-05 MED ORDER — ATORVASTATIN CALCIUM 40 MG PO TABS: 40.0000 mg | ORAL_TABLET | Freq: Every day | ORAL | 0 refills | Status: DC

## 2018-04-05 MED ORDER — POTASSIUM CHLORIDE CRYS ER 20 MEQ PO TBCR: 20.0000 meq | EXTENDED_RELEASE_TABLET | Freq: Every day | ORAL | 0 refills | Status: DC

## 2018-04-05 NOTE — BH Specialist Note (Signed)
Integrated Behavioral Health Initial Visit  MRN: [id number] Name: [name]  Number of [facility] Clinician visits:: 1/6 Session Start time: 4:07pm  Session End time: 4:23 pm Total time: 15 minutes  Type of Service: [place] Interpretor:No. Interpretor Name and Language: n/a   Warm Hand Off Completed.       SUBJECTIVE: Eretria Wade is a 62 y.o. female accompanied by self Patient was referred by Janene Madeira for adjustment concerns. Patient reports the following symptoms/concerns: feeling sad/defeated, blunted affect, trouble accepting HIV diagnosis, strained relationship with husband, lack of support Duration of problem: 1 month; Severity of problem: moderate  OBJECTIVE: Mood: Depressed and Affect: Blunt Risk of harm to self or others: No plan to harm self or others  LIFE CONTEXT: Patient recently had a stroke and while in the hospital was diagnosed as HIV+. She reports that she and her husband have not been sexually active for 20+ years, but they were separated for a few years, during which she had a partner from whom she may have contracted the virus. Patient and husband still live together, but have "separate lives". She has one close friend with whom she has shared her diagnosis.   GOALS ADDRESSED: Patient will: 1. Demonstrate ability to: Increase healthy adjustment to current life circumstances  INTERVENTIONS: Interventions utilized: Motivational Interviewing and Supportive Counseling   ASSESSMENT: Patient currently experiencing difficulty accepting new HIV diagnosis. She reports feeling shocked, and hoping that she will not have to continue taking the medications for her whole life. She presents with a blunted affect and depressed mood. The most appropriate diagnosis for her symptoms is Adjustment Disorder, with Depressed Mood. Counselor guided patient to talk about her diagnosis, but patient stated  she was "dealing with it" the best she could and would not say more. Counselor explored with patient her thoughts about the diagnosis, but patient shared only that she hopes she will not have to always take the medicine. Counselor educated patient on counseling services available at [facility], and encouraged her to take advantage of them. Counselor explained to patient that acceptance often comes in phases, and that support will be helpful to her as she goes through them. Patient declined counseling services, but took counselor's card and agreed to contact counselor if she would like follow up sessions in the future.   Patient may benefit from ongoing CBT to assist with adjustment to diagnosis and changing life circumstances.  PLAN: 1. Patient declined services at this time.  Lillie Fragmin, LCSW

## 2018-04-06 ENCOUNTER — Encounter: Payer: Self-pay | Admitting: Adult Health

## 2018-04-06 ENCOUNTER — Ambulatory Visit (INDEPENDENT_AMBULATORY_CARE_PROVIDER_SITE_OTHER): Payer: BLUE CROSS/BLUE SHIELD | Admitting: Adult Health

## 2018-04-06 VITALS — BP 125/80 | HR 75 | Ht 62.0 in | Wt 139.8 lb

## 2018-04-06 DIAGNOSIS — E119 Type 2 diabetes mellitus without complications: Secondary | ICD-10-CM | POA: Diagnosis not present

## 2018-04-06 DIAGNOSIS — E782 Mixed hyperlipidemia: Secondary | ICD-10-CM

## 2018-04-06 DIAGNOSIS — I69354 Hemiplegia and hemiparesis following cerebral infarction affecting left non-dominant side: Secondary | ICD-10-CM | POA: Diagnosis not present

## 2018-04-06 DIAGNOSIS — I63511 Cerebral infarction due to unspecified occlusion or stenosis of right middle cerebral artery: Secondary | ICD-10-CM | POA: Diagnosis not present

## 2018-04-06 MED ORDER — POTASSIUM CHLORIDE CRYS ER 20 MEQ PO TBCR: 20.0000 meq | EXTENDED_RELEASE_TABLET | Freq: Every day | ORAL | 0 refills | Status: DC

## 2018-04-06 MED ORDER — ATORVASTATIN CALCIUM 40 MG PO TABS: 40.0000 mg | ORAL_TABLET | Freq: Every day | ORAL | 0 refills | Status: DC

## 2018-04-06 NOTE — Patient Instructions (Addendum)
Continue aspirin 81 mg daily  and Lipitor 47m  for secondary stroke prevention  Stop plavix at this time as you have completed 3 weeks of therapy  Continue to follow up with PCP regarding choelsterol and blood pressure management   Try CoQ10 2064mdaily (over the counter in the vitamin section) to see if this helps with arm pain along with continuing of OT. If arm pain continues, we can consider change in cholesterol management   Continue to stay active and maintain a healthy diet  Continue to monitor blood pressure at home  Maintain strict control of hypertension with blood pressure goal below 130/90, diabetes with hemoglobin A1c goal below 6.5% and cholesterol with LDL cholesterol (bad cholesterol) goal below 70 mg/dL. I also advised the patient to eat a healthy diet with plenty of whole grains, cereals, fruits and vegetables, exercise regularly and maintain ideal body weight.  Followup in the future with me in 3 months or call earlier if needed       Thank you for coming [facility]. I hope we have been able to provide you high quality care today.  You may receive a patient satisfaction survey over the next few weeks. We would appreciate your feedback and comments so that we may continue to improve ourselves and the health of our patients.

## 2018-04-06 NOTE — Telephone Encounter (Signed)
Per med list atorvastatin and K was not sent electronically but printed. Will send electronically now to [facility].

## 2018-04-06 NOTE — Progress Notes (Signed)
[facility] [address] [place]. [place] [address] [phone]       OFFICE FOLLOW UP NOTE  [name] Date of Birth:  [birth year] Medical Record Number:  [id number]   Reason for Referral:  hospital stroke follow up  CHIEF COMPLAINT:  Chief Complaint  Patient presents with  . Follow-up    Follow up for stroke hospital room 9 pt alone    HPI: [name] is being seen today for initial visit in the office for right BG infarct secondary to small vessel disease on 02/18/2018. History obtained from patient and chart review. Reviewed all radiology images and labs personally.  Shannon Hemingwayis a 62 y.o.femalewith history of DM, HLD, ulcerative colitis who was admitted to [facility] hospital for left facial droop, slurred speech and left hemiparesis. Patient was evaluated by telemetry neurology and after negative CT had obtained,  IV tPA was administered without complication and was transferred to [facility] for further evaluation and management.  MRI brain reviewed and showed right small BG infarct.  CTA head and neck unremarkable.  2D echo showed an EF of 55 to 60%.  Determined thatinfarct was due tosmall vessel disease.Does have a history of vascular risk factors of HTN, HLD and DB.  Patient was also found to have HIV during stroke workup. ID was consulted and was started on biktarvy (per notes, patient requested to not mention this other family members). LDL 136 and his patient was on statin PTA recommended Lipitor 40 mg at discharge.  HTN stable during admission and recommended long-term BP goal normotensive range.  A1c 9.1 and per notes, patient had stopped all home diabetic medications due to hypoglycemia which was confirmed during hospital admission with BG 66.  Recommended continued PCP follow-up for DM management.  Patient was not on antithrombotic PTA and recommended DAPT for 3 weeks and then aspirin alone.  Also endorses history of migraine  and did increase Topamax dosage during hospital admission with possible consideration of following up with Dr. Jaynee Eagles as outpatient for continued management.  Patient was discharged to [facility] for continued PT/OT/ST.  Patient is being seen today for hospital follow-up.  She continues to have left hemiparesis and dysarthria but has been improving.  She has completed PT/ST and is currently just receiving OT at this time.  She has completed 3 weeks of DAPT but has continued to take aspirin 81 mg along with Plavix 75 mg . Denies side effects of bleeding or bruising spoke to patient regarding discontinuation of Plavix at this time and continuation of aspirin 81 mg daily.  Continues to take Lipitor 40 mg.  She does endorse left shoulder/arm pain which has been present since a stroke and is unable to determine if this could be due to statin myalgias or left hemiparesis.  Blood pressure today satisfactory 125/82.  Does monitor glucose levels at home and this morning's level 84.  She takes Topamax 50 mg at night consistently where prior to admission, she was taking it as needed.  She denies any recent headaches.  Denies new or worsening stroke/TIA symptoms.    ROS:   14 system review of systems performed and negative with exception of fatigue  PMH:  Past Medical History:  Diagnosis Date  . Asthma   . Diabetes mellitus without complication ([place]) 09-01-73   hx. NIDDM-dx. 3 weeks ago  . Hyperlipidemia    hx  . Seasonal allergies   . Stroke ([place])   . Ulcerative colitis ([place])  PSH:  Past Surgical History:  Procedure Laterality Date  . BUNIONECTOMY     bilateral and toe nails of big toes removed  . CESAREAN SECTION  1997/2006  . CHOLECYSTECTOMY N/A 12/01/2012   Procedure: LAPAROSCOPIC CHOLECYSTECTOMY;  Surgeon: Stark Klein, MD;  Location: [facility];  Service: General;  Laterality: N/A;  . COLONOSCOPY W/ POLYPECTOMY    . EYE SURGERY Bilateral 2013  . TONSILLECTOMY      Social History:  Social History    Socioeconomic History  . Marital status: Legally Separated    Spouse name: Not on file  . Number of children: Not on file  . Years of education: Not on file  . Highest education level: Not on file  Occupational History  . Not on file  Social Needs  . Financial resource strain: Not on file  . Food insecurity:    Worry: Not on file    Inability: Not on file  . Transportation needs:    Medical: Not on file    Non-medical: Not on file  Tobacco Use  . Smoking status: Never Smoker  . Smokeless tobacco: Never Used  Substance and Sexual Activity  . Alcohol use: Not Currently    Alcohol/week: 0.0 standard drinks    Comment: occasional  . Drug use: No  . Sexual activity: Not Currently    Partners: Male    Comment: offered condoms  Lifestyle  . Physical activity:    Days per week: Not on file    Minutes per session: Not on file  . Stress: Not on file  Relationships  . Social connections:    Talks on phone: Not on file    Gets together: Not on file    Attends religious service: Not on file    Active member of club or organization: Not on file    Attends meetings of clubs or organizations: Not on file    Relationship status: Not on file  . Intimate partner violence:    Fear of current or ex partner: Not on file    Emotionally abused: Not on file    Physically abused: Not on file    Forced sexual activity: Not on file  Other Topics Concern  . Not on file  Social History Narrative  . Not on file    Family History:  Family History  Problem Relation Age of Onset  . Hypertension Mother   . Diabetes Mother   . Cancer Father        lung ca  . Seizures Daughter        56yr    Medications:   Current Outpatient Medications on File Prior to Visit  Medication Sig Dispense Refill  . acetaminophen (TYLENOL) 325 MG tablet Take 2 tablets (650 mg total) by mouth every 6 (six) hours as needed for mild pain (or temp > 37.5 C (99.5 F)).    .Marland Kitchenalbuterol (PROVENTIL HFA;VENTOLIN HFA)  108 (90 Base) MCG/ACT inhaler Inhale 2 puffs into the lungs every 6 (six) hours as needed for wheezing or shortness of breath. 1 Inhaler 0  . aspirin EC 81 MG EC tablet Take 1 tablet (81 mg total) by mouth daily.    .Marland Kitchenatorvastatin (LIPITOR) 40 MG tablet Take 1 tablet (40 mg total) by mouth daily at 6 PM. 30 tablet 0  . bictegravir-emtricitabine-tenofovir AF (BIKTARVY) 50-200-25 MG TABS tablet Take 1 tablet by mouth daily. 30 tablet 5  . citalopram (CELEXA) 10 MG tablet Take 1 tablet (10 mg total) by  mouth at bedtime. 30 tablet 2  . clopidogrel (PLAVIX) 75 MG tablet Take 1 tablet (75 mg total) by mouth daily. 30 tablet 11  . diclofenac sodium (VOLTAREN) 1 % GEL Apply 1 application topically 3 (three) times daily. Left knee 3 Tube 4  . glipiZIDE (GLUCOTROL) 10 MG tablet Take 1 tablet (10 mg total) by mouth 2 (two) times daily before a meal. 60 tablet 3  . glucose blood (FREESTYLE LITE) test strip 1 each by Other route 2 (two) times daily. 100 each 5  . potassium chloride SA (K-DUR,KLOR-CON) 20 MEQ tablet Take 1 tablet (20 mEq total) by mouth daily. 30 tablet 0  . topiramate (TOPAMAX) 50 MG tablet Take 1 tablet (50 mg total) by mouth at bedtime. 30 tablet 2  . sertraline (ZOLOFT) 25 MG tablet TK 1 T PO DAILY  2   No current facility-administered medications on file prior to visit.     Allergies:   Allergies  Allergen Reactions  . Azithromycin Itching    Muscle aches  . Erythromycin Nausea And Vomiting  . Penicillins Nausea And Vomiting    Has patient had a PCN reaction causing immediate rash, facial/tongue/throat swelling, SOB or lightheadedness with hypotension: Yes Has patient had a PCN reaction causing severe rash involving mucus membranes or skin necrosis: No Has patient had a PCN reaction that required hospitalization No Has patient had a PCN reaction occurring within the last 10 years: No If all of the above answers are "NO", then may proceed with Cephalosporin use.   . Tetracycline  Nausea And Vomiting     Physical Exam  Vitals:   04/06/18 0951  BP: 125/80  Pulse: 75  Weight: 139 lb 12.8 oz (63.4 kg)  Height: 5' 2"  (1.575 m)   Body mass index is 25.57 kg/m. No exam data present  General: well developed, well nourished, pleasant middle-aged African-American female, seated, in no evident distress Head: head normocephalic and atraumatic.   Neck: supple with no carotid or supraclavicular bruits Cardiovascular: regular rate and rhythm, no murmurs Musculoskeletal: no deformity Skin:  no rash/petichiae Vascular:  Normal pulses all extremities  Neurologic Exam Mental Status: Awake and fully alert.  Mild dysarthria.  Oriented to place and time. Recent and remote memory intact. Attention span, concentration and fund of knowledge appropriate. Mood and affect appropriate.  Cranial Nerves: Fundoscopic exam reveals sharp disc margins. Pupils equal, briskly reactive to light. Extraocular movements full without nystagmus. Visual fields full to confrontation. Hearing intact. Facial sensation intact. Face, tongue, palate moves normally and symmetrically.  Motor: Normal bulk and tone.  LUE: 4-/5, and grip weakness noted, LLE: 4-/5 greater than hip flexor; full strength RUE and RLE Sensory.:  Decreased sensation on left upper and lower extremity compared to right upper and lower extremity Coordination: Rapid alternating movements normal on right side. Finger-to-nose and heel-to-shin performed accurately on right side.  Orbits right arm over left arm.  Decreased left hand dexterity Gait and Station: Arises from chair without difficulty. Stance is normal. Gait demonstrates normal stride length and balance except for mild hemiplegic gait -does not use assistive device.  Unable to tandem walk. Reflexes: 1+ and symmetric. Toes downgoing.    NIHSS  1 Modified Rankin  1    Diagnostic Data (Labs, Imaging, Testing)  Ct Head Code Stroke Wo Contrast 02/18/2018 IMPRESSION:  1. No  hemorrhage or mass effect.  2. ASPECTS is 10.   Ct Angio Head W Or Wo Contrast Ct Angio Neck W Or Wo Contrast  02/18/2018 IMPRESSION:  No emergent large vessel occlusion. Normal CTA of the head and neck   Mr Brain Wo Contrast 02/18/2018 IMPRESSION:  1. Acute small vessel infarct of the right basal ganglia, in close proximity to the right internal capsule, in keeping with reported left-sided weakness.  2. No hemorrhage or mass effect.   TTE - Left ventricle: The cavity size was normal. Wall thickness wasincreased in a pattern of mild LVH. Systolic function was normal.The estimated ejection fraction was in the range of 55% to 60%.Wall motion was normal; there were no regional wall motionabnormalities. Doppler parameters are consistent with abnormalleft ventricular relaxation (grade 1 diastolic dysfunction). Impressions: - No cardiac source of emboli was indentified.    ASSESSMENT: [name] is a 62 y.o. year old female here with right BG infarct on 02/18/2018 secondary to small vessel disease. Vascular risk factors include HLD and DM.  Patient is being seen today for hospital follow-up and continues to have left hemiparesis and dysarthria but has been improving.    PLAN: -Continue aspirin 81 mg daily  and Lipitor 40 mg for secondary stroke prevention -Stop Plavix at this time has 3-week DAPT completed -Unclear if left arm pain is due to statin myalgia versus left hemiparesis -recommend trial of CoQ10 20 mg daily along with continuation of OT -if left arm pain continues, can consider change in statin -F/u with PCP regarding your HLD and DM management -continue to monitor BP at home along with glucose levels -advised to continue to stay active and maintain a healthy diet along with continuation of home exercises for continued left hemiparesis with dysarthria -Maintain strict control of hypertension with blood pressure goal below 130/90, diabetes with hemoglobin A1c goal below  6.5% and cholesterol with LDL cholesterol (bad cholesterol) goal below 70 mg/dL. I also advised the patient to eat a healthy diet with plenty of whole grains, cereals, fruits and vegetables, exercise regularly and maintain ideal body weight.  Follow up in 3 months or call earlier if needed   Greater than 50% of time during this 25 minute visit was spent on counseling,explanation of diagnosis of right BG infarct, reviewing risk factor management of HLD and DM, planning of further management, discussion with patient and family and coordination of care    Venancio Poisson, AGNP-BC  [facility] [address] [place], [place] [address]  Phone [phone] Fax [phone] Note: This document was prepared with digital dictation and possible smart phrase technology. Any transcriptional errors that result from this process are unintentional.

## 2018-04-06 NOTE — Telephone Encounter (Signed)
[facility] calling because they did not get the scripts prescribed yesterday. Please resend e-script.

## 2018-04-06 NOTE — Addendum Note (Signed)
Addended by: Helene Shoe on: 04/06/2018 11:37 AM   Modules accepted: Orders

## 2018-04-08 ENCOUNTER — Ambulatory Visit: Payer: BLUE CROSS/BLUE SHIELD | Admitting: Occupational Therapy

## 2018-04-08 ENCOUNTER — Encounter: Payer: Self-pay | Admitting: Occupational Therapy

## 2018-04-08 DIAGNOSIS — M6281 Muscle weakness (generalized): Secondary | ICD-10-CM

## 2018-04-08 DIAGNOSIS — R279 Unspecified lack of coordination: Principal | ICD-10-CM

## 2018-04-08 DIAGNOSIS — R278 Other lack of coordination: Secondary | ICD-10-CM | POA: Diagnosis not present

## 2018-04-08 LAB — HIV-1 RNA QUANT-NO REFLEX-BLD
HIV 1 RNA Quant: 39 {copies}/mL — ABNORMAL HIGH
HIV-1 RNA Quant, Log: 1.59 {Log_copies}/mL — ABNORMAL HIGH

## 2018-04-08 LAB — HLA B*5701: HLA-B*5701 w/rflx HLA-B High: NEGATIVE

## 2018-04-08 LAB — HEPATITIS A ANTIBODY, TOTAL: Hepatitis A AB,Total: NONREACTIVE

## 2018-04-08 LAB — QUANTIFERON-TB GOLD PLUS
NIL: 0.05 [IU]/mL
QuantiFERON-TB Gold Plus: NEGATIVE
TB1-NIL: <0 IU/mL
TB2-NIL: <0 IU/mL

## 2018-04-08 LAB — HEPATITIS B CORE ANTIBODY, TOTAL: Hep B Core Total Ab: NONREACTIVE

## 2018-04-08 NOTE — Therapy (Addendum)
[place] MAIN [facility] SERVICES [address] [place], [place], [address] Phone: [phone]   Fax:  [phone]  Occupational Therapy Treatment  Patient Details  Name: [name] MRN: [id number] Date of Birth: 1955-10-18 No data recorded  Encounter Date: 04/08/2018  OT End of Session - 04/08/18 1329    Visit Number  2    Number of Visits  24    Date for OT Re-Evaluation  06/24/18    Authorization Type  visit 2 of 10 for progress report period starting 04/01/2018    OT Start Time  1110    OT Stop Time  1145    OT Time Calculation (min)  35 min    Activity Tolerance  Patient tolerated treatment well    Behavior During Therapy  [facility] for tasks assessed/performed       Past Medical History:  Diagnosis Date  . Asthma   . Diabetes mellitus without complication ([place]) 1-77-93   hx. NIDDM-dx. 3 weeks ago  . Hyperlipidemia    hx  . Seasonal allergies   . Stroke ([place])   . Ulcerative colitis [facility])     Past Surgical History:  Procedure Laterality Date  . BUNIONECTOMY     bilateral and toe nails of big toes removed  . CESAREAN SECTION  1997/2006  . CHOLECYSTECTOMY N/A 12/01/2012   Procedure: LAPAROSCOPIC CHOLECYSTECTOMY;  Surgeon: Stark Klein, MD;  Location: [facility];  Service: General;  Laterality: N/A;  . COLONOSCOPY W/ POLYPECTOMY    . EYE SURGERY Bilateral 2013  . TONSILLECTOMY      There were no vitals filed for this visit.  Subjective Assessment - 04/08/18 1326    Subjective   Pt reports she is doing well today    Pertinent History  Pt is a 62 y.o. female presenting with LUE weakness and decreased [place] as a result of R basal ganglia CVA on 8/08. Pt was hospitalized for 1 week where she received inpatient therapy and was then discharged home where she received home health therapy for 2 weeks. Prior to CVA, pt was independent for ADLs and IADLs. Currently, pt requires increased assistance, increased time, and/or modified strategies for  ADLs and IADLs.     Patient Stated Goals  Pt wants to be able to do everthing with her L hand that her R hand can do, including making wigs, picking up laundry baskets, manipulating coins for shopping, and grooming hair.    Currently in Pain?  Yes    Pain Score  5     Pain Location  Shoulder    Pain Orientation  Left    Pain Descriptors / Indicators  Tightness;Aching    Pain Type  Chronic pain    Pain Onset  More than a month ago    Pain Frequency  Occasional    Aggravating Factors   Raising L arm    Multiple Pain Sites  No       OT TREATMENT  Therapeutic exercise:  Pt completed L shoulder AAROM exercises including shoulder flexion, abduction, and horizontal abduction while lying supine in preparation for treatment. Pt reported L shoulder pain of ~5-6 prior to exercises. After L shoulder AAROM pt reported no pain when at rest, and pain of ~3-4 when active. Pt completed LUE exercises using a 1# dumbbell including bicep curls. Pt required stabilization at wrist and support for full muscle excursion. Pt was able to complete shoulder and wrist exercises using 1# dumbbell.  Neuromuscular re-education:  Pt  completed [facility] tasks that required her to use L hand to remove 1" washers from a magnetic dish while opposing thumb to 2nd and 3rd digit before placing in rows on table top. Pt required increased time and demonstrated increased coordination as activity progressed. Pt completed task that required her to use L and to place and remove 1" circular pegs from pegboard while alternating, opposing thumb to 2nd and 3rd digits. Pt required increased time to place pegs on pegboard. Pt demonstrated increased coordination using thumb and 2nd digit to remove pegs.                OT Education - 04/08/18 1329    Education Details  [place] skills, UE strengthening, HEP    Person(s) Educated  Patient    Methods  Explanation;Demonstration    Comprehension  Verbalized understanding;Returned  demonstration          OT Long Term Goals - 04/01/18 1214      OT LONG TERM GOAL #1   Title  Pt will decreased [place] by 15 secs of speed in order to independently manipulate coins during grocery shopping    Baseline  Pt drops coins when checking out at grocery store; [facility] 3 mins 06 secs    Time  12    Period  Weeks    Status  New    Target Date  06/24/18      OT LONG TERM GOAL #2   Title  Pt will increase LUE strength by 1 mm level in order to independently pick up laundry baskets during IADLs    Baseline  Pt is unable to pick up laundry baskets; MMT LUE 3+/5    Time  12    Period  Weeks    Status  New    Target Date  06/24/18      OT LONG TERM GOAL #3   Title  Pt will increase L shoulder ROM by 10 degrees in efficiently bathe and dress UB    Baseline  Pt is unable to use L hand to bathe and dress efficiently; L AROM shoulder abd 52 degrees    Time  12    Period  Weeks    Status  New    Target Date  06/24/18      OT LONG TERM GOAL #4   Title  Pt will increase L lateral pinch by 3# in order to clip nails independently    Baseline  Pt is unable to use L hand to clip nails; L lateral pinch is 3#    Time  12    Period  Weeks    Status  New    Target Date  06/24/18      OT LONG TERM GOAL #5   Title  Pt will increase L 3 point pinch by 4# to be able to independently button small buttons    Baseline  Pt requires increased time to button clothes using L hand; L 3 point pinch is 2#    Time  12    Period  Weeks    Status  New    Target Date  06/24/18            Plan - 04/08/18 1330    Clinical Impression Statement  Pt presents with L [facility] defcits, decreased LUE ROM, and LUE pain. Pt demonstrates improvements in L [facility] skills and is able to move L hand more than at eval. Pt experiences LUE pain with shoulder AROM, pt experiences  decreased pain with shoulder AAROM. Pt still requires assistance for daily tasks such as light cleaning, handling coins while grocery shopping, and  grooming.    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 Pt enjoys singing for leisure however has not been able to do so since experiencing CVA.    Occupational performance deficits (Please refer to evaluation for details):  ADL's;IADL's;Leisure    Rehab Potential  Good    Current Impairments/barriers affecting progress:  Positive: age, motivate, family suppory; Negative:     OT Frequency  2x / week    OT Duration  12 weeks    OT Treatment/Interventions  Self-care/ADL training;Therapeutic exercise;Neuromuscular education;Therapeutic activities;Manual Therapy;Passive range of motion    Clinical Decision Making  Several treatment options, min-mod task modification necessary    Consulted and Agree with Plan of Care  Patient       Patient will benefit from skilled therapeutic intervention in order to improve the following deficits and impairments:  Pain, Decreased cognition, Decreased range of motion, Decreased strength, Impaired perceived functional ability, Impaired UE functional use  Visit Diagnosis: Decreased coordination  Muscle weakness (generalized)    Problem List Patient Active Problem List   Diagnosis Date Noted  . Adjustment disorder with depressed mood 04/01/2018  . Healthcare maintenance 04/01/2018  . Essential hypertension 02/23/2018  . Migraine 02/23/2018  . HIV disease ([place])   . Stroke (cerebrum) ([place]) - R basal ganglia d/t small vessel dz 02/18/2018  . HLD (hyperlipidemia) 08/02/2014  . Type 2 diabetes mellitus ([place]) 07/31/2014  . Ulcerative colitis ([place]) 06/26/2008    Oliver Hum, OTS 04/08/2018, 1:39 PM   This entire session was performed under direct supervision and direction of a licensed therapist/therapist assistant . I have personally read, edited and approve of the note as written.  Harrel Carina, MS, OTR/L   [place] MAIN [facility] SERVICES [address] [place], [place], [address] Phone: [phone]   Fax:  [phone]  Name: [name] MRN: [id number] Date of Birth: July 24, 1955

## 2018-04-09 NOTE — Progress Notes (Signed)
I agree with the above plan 

## 2018-04-10 NOTE — Therapy (Signed)
[place] MAIN [facility] SERVICES [address] [place], [place], [address] Phone: [phone]   Fax:  [phone]  Occupational Therapy Treatment  Patient Details  Name: [name] MRN: [id number] Date of Birth: 09-Aug-1955 No data recorded  Encounter Date: 04/05/2018    Past Medical History:  Diagnosis Date  . Asthma   . Diabetes mellitus without complication ([place]) 01-07-02   hx. NIDDM-dx. 3 weeks ago  . Hyperlipidemia    hx  . Seasonal allergies   . Stroke ([place])   . Ulcerative colitis [facility])     Past Surgical History:  Procedure Laterality Date  . BUNIONECTOMY     bilateral and toe nails of big toes removed  . CESAREAN SECTION  1997/2006  . CHOLECYSTECTOMY N/A 12/01/2012   Procedure: LAPAROSCOPIC CHOLECYSTECTOMY;  Surgeon: Stark Klein, MD;  Location: [facility];  Service: General;  Laterality: N/A;  . COLONOSCOPY W/ POLYPECTOMY    . EYE SURGERY Bilateral 2013  . TONSILLECTOMY      There were no vitals filed for this visit.                             OT Long Term Goals - 04/01/18 1214      OT LONG TERM GOAL #1   Title  Pt will decreased [place] by 15 secs of speed in order to independently manipulate coins during grocery shopping    Baseline  Pt drops coins when checking out at grocery store; [facility] 3 mins 06 secs    Time  12    Period  Weeks    Status  New    Target Date  06/24/18      OT LONG TERM GOAL #2   Title  Pt will increase LUE strength by 1 mm level in order to independently pick up laundry baskets during IADLs    Baseline  Pt is unable to pick up laundry baskets; MMT LUE 3+/5    Time  12    Period  Weeks    Status  New    Target Date  06/24/18      OT LONG TERM GOAL #3   Title  Pt will increase L shoulder ROM by 10 degrees in efficiently bathe and dress UB    Baseline  Pt is unable to use L hand to bathe and dress efficiently; L AROM shoulder abd 52 degrees    Time  12    Period  Weeks     Status  New    Target Date  06/24/18      OT LONG TERM GOAL #4   Title  Pt will increase L lateral pinch by 3# in order to clip nails independently    Baseline  Pt is unable to use L hand to clip nails; L lateral pinch is 3#    Time  12    Period  Weeks    Status  New    Target Date  06/24/18      OT LONG TERM GOAL #5   Title  Pt will increase L 3 point pinch by 4# to be able to independently button small buttons    Baseline  Pt requires increased time to button clothes using L hand; L 3 point pinch is 2#    Time  12    Period  Weeks    Status  New    Target Date  06/24/18  Patient will benefit from skilled therapeutic intervention in order to improve the following deficits and impairments:     Visit Diagnosis: Muscle weakness (generalized)  Other lack of coordination    Problem List Patient Active Problem List   Diagnosis Date Noted  . Adjustment disorder with depressed mood 04/01/2018  . Healthcare maintenance 04/01/2018  . Essential hypertension 02/23/2018  . Migraine 02/23/2018  . HIV disease ([place])   . Stroke (cerebrum) ([place]) - R basal ganglia d/t small vessel dz 02/18/2018  . HLD (hyperlipidemia) 08/02/2014  . Type 2 diabetes mellitus ([place]) 07/31/2014  . Ulcerative colitis ([place]) 06/26/2008   [name], OTR/L, CLT  Angeles Paolucci 04/10/2018, 7:21 PM  [place] MAIN [facility] SERVICES [address] [place], [place], [address] Phone: [phone]   Fax:  [phone]  Name: [name] MRN: [id number] Date of Birth: March 17, 1956

## 2018-04-12 ENCOUNTER — Telehealth: Payer: Self-pay | Admitting: *Deleted

## 2018-04-12 NOTE — Telephone Encounter (Signed)
Oh I am so glad she called back to review. I am very proud of her!

## 2018-04-12 NOTE — Telephone Encounter (Signed)
Patient called to review her lab results, feels incredibly proud of her results. She has switched to [employer] and is happy with them. She confirmed her upcoming appointment in November. Landis Gandy, RN

## 2018-04-13 ENCOUNTER — Ambulatory Visit: Payer: BLUE CROSS/BLUE SHIELD | Admitting: Occupational Therapy

## 2018-04-14 ENCOUNTER — Encounter: Payer: Self-pay | Admitting: Infectious Diseases

## 2018-04-15 ENCOUNTER — Ambulatory Visit: Payer: BLUE CROSS/BLUE SHIELD | Admitting: Occupational Therapy

## 2018-04-19 ENCOUNTER — Other Ambulatory Visit: Payer: Self-pay | Admitting: Internal Medicine

## 2018-04-19 ENCOUNTER — Encounter: Payer: Self-pay | Admitting: Occupational Therapy

## 2018-04-19 DIAGNOSIS — E876 Hypokalemia: Secondary | ICD-10-CM

## 2018-04-19 NOTE — Therapy (Signed)
[place] MAIN [facility] SERVICES [address] [place], [place], [address] Phone: [phone]   Fax:  [phone]  Occupational Therapy Treatment  Patient Details  Name: [name] MRN: [id number] Date of Birth: 03-Jan-1956 No data recorded  Encounter Date: 04/05/2018  OT End of Session - 04/19/18 1336    Visit Number  2    Number of Visits  24    Date for OT Re-Evaluation  06/24/18    Authorization Type  visit 2 of 10 for progress report period starting 04/01/2018    OT Start Time  1300    OT Stop Time  1345    OT Time Calculation (min)  45 min    Activity Tolerance  Patient tolerated treatment well    Behavior During Therapy  [facility] for tasks assessed/performed       Past Medical History:  Diagnosis Date  . Asthma   . Diabetes mellitus without complication ([place]) 7-49-44   hx. NIDDM-dx. 3 weeks ago  . Hyperlipidemia    hx  . Seasonal allergies   . Stroke ([place])   . Ulcerative colitis [facility])     Past Surgical History:  Procedure Laterality Date  . BUNIONECTOMY     bilateral and toe nails of big toes removed  . CESAREAN SECTION  1997/2006  . CHOLECYSTECTOMY N/A 12/01/2012   Procedure: LAPAROSCOPIC CHOLECYSTECTOMY;  Surgeon: Stark Klein, MD;  [facility];  Service: General;  Laterality: N/A;  . COLONOSCOPY W/ POLYPECTOMY    . EYE SURGERY Bilateral 2013  . TONSILLECTOMY      There were no vitals filed for this visit.  Subjective Assessment - 04/19/18 1335    Subjective   Patient reports she would like to work on her shoulder to decrease pain and improve motion    Pertinent History  Pt is a 62 y.o. female presenting with LUE weakness and decreased [place] as a result of R basal ganglia CVA on 8/08. Pt was hospitalized for 1 week where she received inpatient therapy and was then discharged home where she received home health therapy for 2 weeks. Prior to CVA, pt was independent for ADLs and IADLs. Currently, pt requires increased  assistance, increased time, and/or modified strategies for ADLs and IADLs.     Patient Stated Goals  Pt wants to be able to do everthing with her L hand that her R hand can do, including making wigs, picking up laundry baskets, manipulating coins for shopping, and grooming hair.    Currently in Pain?  Yes    Pain Score  5     Pain Location  Shoulder    Pain Orientation  Left    Pain Descriptors / Indicators  Aching;Tightness    Pain Type  Chronic pain    Pain Onset  More than a month ago    Pain Frequency  Intermittent    Multiple Pain Sites  No         Pt seen for exercises in supine as follows:   LUE scapular mobilizations for elevation, retraction, upwards rotation in sidelying  Supine:  AAROM for shoulder flexion to 90 degrees with guiding for 10 reps, place and hold of arm in 90 degrees of flexion With cues and therapist min guard, shoulder protraction for 10 reps with cues for form and technique.   ABC exercise for 2 trials with cues and some occasional min assist. Patient performing bilateral ROM with use of ball and hands in neutral position, shoulder flexion  to 110 for 10 reps for 2 sets Alternating ball to each shoulder for 10 reps, chest press for 10 reps X 2.    Fine motor coordination tasks in sitting with manipulation of 1 inch objects with cues for left hand use. Slow to move and difficulty with isolated movements of the hand.                    OT Education - 04/19/18 1336    Education Details  shoulder stabilization exercises    Person(s) Educated  Patient    Methods  Explanation;Demonstration    Comprehension  Verbalized understanding;Returned demonstration          OT Long Term Goals - 04/01/18 1214      OT LONG TERM GOAL #1   Title  Pt will decreased [place] by 15 secs of speed in order to independently manipulate coins during grocery shopping    Baseline  Pt drops coins when checking out at grocery store; [facility] 3 mins 06 secs    Time  12     Period  Weeks    Status  New    Target Date  06/24/18      OT LONG TERM GOAL #2   Title  Pt will increase LUE strength by 1 mm level in order to independently pick up laundry baskets during IADLs    Baseline  Pt is unable to pick up laundry baskets; MMT LUE 3+/5    Time  12    Period  Weeks    Status  New    Target Date  06/24/18      OT LONG TERM GOAL #3   Title  Pt will increase L shoulder ROM by 10 degrees in efficiently bathe and dress UB    Baseline  Pt is unable to use L hand to bathe and dress efficiently; L AROM shoulder abd 52 degrees    Time  12    Period  Weeks    Status  New    Target Date  06/24/18      OT LONG TERM GOAL #4   Title  Pt will increase L lateral pinch by 3# in order to clip nails independently    Baseline  Pt is unable to use L hand to clip nails; L lateral pinch is 3#    Time  12    Period  Weeks    Status  New    Target Date  06/24/18      OT LONG TERM GOAL #5   Title  Pt will increase L 3 point pinch by 4# to be able to independently button small buttons    Baseline  Pt requires increased time to button clothes using L hand; L 3 point pinch is 2#    Time  12    Period  Weeks    Status  New    Target Date  06/24/18            Plan - 04/19/18 1336    Clinical Impression Statement  Pt with pain in left UE this date 5/10 at beginning of session and reports reduction to 2/10 at the end of the session.  Focused on exercises in supine for ROM and shoulder stabilization to decrease pain and increase motion in LUE. Patient continues to demonstrate limitations in left UE which impair her ability to complete basic self care and IADL tasks.  Continue to work towards reducing pain, increasing motion and increasing independence  in daily tasks.     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 Pt enjoys singing for leisure  however has not been able to do so since experiencing CVA.    Occupational performance deficits (Please refer to evaluation for details):  ADL's;IADL's;Leisure    Rehab Potential  Good    Current Impairments/barriers affecting progress:  Positive: age, motivate, family suppory; Negative:     OT Frequency  2x / week    OT Duration  12 weeks    OT Treatment/Interventions  Self-care/ADL training;Therapeutic exercise;Neuromuscular education;Therapeutic activities;Manual Therapy;Passive range of motion    Consulted and Agree with Plan of Care  Patient       Patient will benefit from skilled therapeutic intervention in order to improve the following deficits and impairments:  Pain, Decreased cognition, Decreased range of motion, Decreased strength, Impaired perceived functional ability, Impaired UE functional use  Visit Diagnosis: Muscle weakness (generalized)  Other lack of coordination    Problem List Patient Active Problem List   Diagnosis Date Noted  . Adjustment disorder with depressed mood 04/01/2018  . Healthcare maintenance 04/01/2018  . Essential hypertension 02/23/2018  . Migraine 02/23/2018  . HIV disease ([place])   . Stroke (cerebrum) ([place]) - R basal ganglia d/t small vessel dz 02/18/2018  . HLD (hyperlipidemia) 08/02/2014  . Type 2 diabetes mellitus ([place]) 07/31/2014  . Ulcerative colitis ([place]) 06/26/2008   Tatum Corl T Emmalise Huard, OTR/L, CLT  Dallyn Bergland 04/19/2018, 1:40 PM  [place] MAIN [facility] SERVICES [address] [place], [place], [address] Phone: [phone]   Fax:  [phone]  Name: [name] MRN: [id number] Date of Birth: 1956/03/12

## 2018-04-20 ENCOUNTER — Ambulatory Visit: Payer: BLUE CROSS/BLUE SHIELD | Attending: Physical Medicine & Rehabilitation | Admitting: Occupational Therapy

## 2018-04-20 ENCOUNTER — Encounter: Payer: Self-pay | Admitting: Occupational Therapy

## 2018-04-20 DIAGNOSIS — R278 Other lack of coordination: Secondary | ICD-10-CM | POA: Insufficient documentation

## 2018-04-20 DIAGNOSIS — M6281 Muscle weakness (generalized): Secondary | ICD-10-CM | POA: Insufficient documentation

## 2018-04-20 NOTE — Therapy (Signed)
[place] MAIN [facility] SERVICES [address] [place], [place], [address] Phone: [phone]   Fax:  [phone]  Occupational Therapy Treatment  Patient Details  Name: Anzleigh Slaven MRN: [id number] Date of Birth: 12-Dec-1955 No data recorded  Encounter Date: 04/20/2018  OT End of Session - 04/20/18 0952    Visit Number  4    Number of Visits  24    Date for OT Re-Evaluation  06/24/18    Authorization Type  visit 4 of 10 for progress report period starting 04/01/2018    OT Start Time  0857    OT Stop Time  0931    OT Time Calculation (min)  34 min    Activity Tolerance  Patient tolerated treatment well    Behavior During Therapy  [facility] for tasks assessed/performed       Past Medical History:  Diagnosis Date  . Asthma   . Diabetes mellitus without complication ([place]) 1-85-63   hx. NIDDM-dx. 3 weeks ago  . Hyperlipidemia    hx  . Seasonal allergies   . Stroke ([place])   . Ulcerative colitis [facility])     Past Surgical History:  Procedure Laterality Date  . BUNIONECTOMY     bilateral and toe nails of big toes removed  . CESAREAN SECTION  1997/2006  . CHOLECYSTECTOMY N/A 12/01/2012   Procedure: LAPAROSCOPIC CHOLECYSTECTOMY;  Surgeon: Stark Klein, MD;  Location: [facility];  Service: General;  Laterality: N/A;  . COLONOSCOPY W/ POLYPECTOMY    . EYE SURGERY Bilateral 2013  . TONSILLECTOMY      There were no vitals filed for this visit.  Subjective Assessment - 04/20/18 0948    Subjective   Patient reports she had a migraine last week and couldn't make it.  She states she is having a great day today and in a good mood.  She reports some pain with left shoulder when attempting to raise.     Pertinent History  Pt is a 62 y.o. female presenting with LUE weakness and decreased [place] as a result of R basal ganglia CVA on 8/08. Pt was hospitalized for 1 week where she received inpatient therapy and was then discharged home where she received home health  therapy for 2 weeks. Prior to CVA, pt was independent for ADLs and IADLs. Currently, pt requires increased assistance, increased time, and/or modified strategies for ADLs and IADLs.     Patient Stated Goals  Pt wants to be able to do everthing with her L hand that her R hand can do, including making wigs, picking up laundry baskets, manipulating coins for shopping, and grooming hair.    Currently in Pain?  Yes    Pain Score  8     Pain Location  Shoulder    Pain Orientation  Left    Pain Descriptors / Indicators  Aching;Tightness    Pain Type  Chronic pain    Pain Onset  More than a month ago    Pain Frequency  Intermittent    Multiple Pain Sites  No          Pt seen for exercises in supine as follows:   LUE scapular mobilizations for elevation, retraction, upwards rotation in sidelying with therapist performing PROM followed by AAROM with cues and guiding.   Supine:  AAROM for shoulder flexion to 90 degrees with guiding for 10 reps, place and hold of arm in 90 degrees of flexion, added external perturbations this date to increase challenge With  cues and therapist min guard, shoulder protraction for 10 reps with cues for form and technique, circles clockwise and counterclockwise.   ABC exercise for 1 trial with cues for size of movements Patient performing bilateral ROM with use of ball and hands in neutral position, shoulder flexion to 110 for 10 reps for 2 sets,  Alternating ball to each shoulder for 10 reps, chest press for 10 reps X 2.    Manipulation of [place] discs with left hand with cues to turn and flip to opposite side, cues to use middle finger and thumb to pick up and then turn with index finger.                   OT Education - 04/20/18 [phone]    Education Details  ROM, shoulder stabilization, manipulation skills with left hand    Person(s) Educated  Patient    Methods  Explanation;Demonstration;Verbal cues    Comprehension  Verbalized  understanding;Returned demonstration;Verbal cues required          OT Long Term Goals - 04/01/18 1214      OT LONG TERM GOAL #1   Title  Pt will decreased [place] by 15 secs of speed in order to independently manipulate coins during grocery shopping    Baseline  Pt drops coins when checking out at grocery store; [facility] 3 mins 06 secs    Time  12    Period  Weeks    Status  New    Target Date  06/24/18      OT LONG TERM GOAL #2   Title  Pt will increase LUE strength by 1 mm level in order to independently pick up laundry baskets during IADLs    Baseline  Pt is unable to pick up laundry baskets; MMT LUE 3+/5    Time  12    Period  Weeks    Status  New    Target Date  06/24/18      OT LONG TERM GOAL #3   Title  Pt will increase L shoulder ROM by 10 degrees in efficiently bathe and dress UB    Baseline  Pt is unable to use L hand to bathe and dress efficiently; L AROM shoulder abd 52 degrees    Time  12    Period  Weeks    Status  New    Target Date  06/24/18      OT LONG TERM GOAL #4   Title  Pt will increase L lateral pinch by 3# in order to clip nails independently    Baseline  Pt is unable to use L hand to clip nails; L lateral pinch is 3#    Time  12    Period  Weeks    Status  New    Target Date  06/24/18      OT LONG TERM GOAL #5   Title  Pt will increase L 3 point pinch by 4# to be able to independently button small buttons    Baseline  Pt requires increased time to button clothes using L hand; L 3 point pinch is 2#    Time  12    Period  Weeks    Status  New    Target Date  06/24/18            Plan - 04/20/18 8527    Clinical Impression Statement  Patient missed her appts last week but reports she did participate in some exercises at home,  she purchased a ball to use and also a 1# and 2# weight.  Patient continues to complain of pain, 8/10 today however decreased to 3/10 with treatment.  Patient requires cues and guiding for exercises in supine working towards  shoulder stabilization.  Continues to demonstrate decreased isolated finger movements with manipulation of objects, cues to use index finger.     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 Pt enjoys singing for leisure however has not been able to do so since experiencing CVA.    Occupational performance deficits (Please refer to evaluation for details):  ADL's;IADL's;Leisure    Rehab Potential  Good    Current Impairments/barriers affecting progress:  Positive: age, motivate, family suppory; Negative:     OT Frequency  2x / week    OT Duration  12 weeks    OT Treatment/Interventions  Self-care/ADL training;Therapeutic exercise;Neuromuscular education;Therapeutic activities;Manual Therapy;Passive range of motion    Consulted and Agree with Plan of Care  Patient       Patient will benefit from skilled therapeutic intervention in order to improve the following deficits and impairments:  Pain, Decreased cognition, Decreased range of motion, Decreased strength, Impaired perceived functional ability, Impaired UE functional use  Visit Diagnosis: Muscle weakness (generalized)  Other lack of coordination    Problem List Patient Active Problem List   Diagnosis Date Noted  . Adjustment disorder with depressed mood 04/01/2018  . Healthcare maintenance 04/01/2018  . Essential hypertension 02/23/2018  . Migraine 02/23/2018  . HIV disease ([place])   . Stroke (cerebrum) ([place]) - R basal ganglia d/t small vessel dz 02/18/2018  . HLD (hyperlipidemia) 08/02/2014  . Type 2 diabetes mellitus ([place]) 07/31/2014  . Ulcerative colitis ([place]) 06/26/2008   Kenyatta Gloeckner T Ebrima Ranta, OTR/L, CLT  Talyia Allende 04/20/2018, 9:59 AM  [place] MAIN [facility] SERVICES [address] [place], [place], [address] Phone: [phone]   Fax:  [phone]  Name:  Yina Riviere MRN: [id number] Date of Birth: [birth year]

## 2018-04-26 ENCOUNTER — Other Ambulatory Visit (INDEPENDENT_AMBULATORY_CARE_PROVIDER_SITE_OTHER): Payer: BLUE CROSS/BLUE SHIELD

## 2018-04-26 ENCOUNTER — Other Ambulatory Visit: Payer: Self-pay

## 2018-04-26 DIAGNOSIS — E876 Hypokalemia: Secondary | ICD-10-CM

## 2018-04-27 ENCOUNTER — Ambulatory Visit: Payer: BLUE CROSS/BLUE SHIELD | Admitting: Occupational Therapy

## 2018-04-27 ENCOUNTER — Encounter: Payer: Self-pay | Admitting: Occupational Therapy

## 2018-04-27 DIAGNOSIS — R278 Other lack of coordination: Secondary | ICD-10-CM | POA: Diagnosis not present

## 2018-04-27 DIAGNOSIS — M6281 Muscle weakness (generalized): Secondary | ICD-10-CM | POA: Diagnosis not present

## 2018-04-27 LAB — POTASSIUM: Potassium: 3.8 mEq/L (ref 3.5–5.1)

## 2018-04-27 NOTE — Therapy (Addendum)
[place] MAIN [facility] SERVICES [address] [place], [place], [address] Phone: [phone]   Fax:  [phone]  Occupational Therapy Treatment  Patient Details  Name: [name] MRN: [id number] Date of Birth: 1956-07-11 No data recorded  Encounter Date: 04/27/2018  OT End of Session - 04/27/18 1709    Visit Number  5    Number of Visits  24    Date for OT Re-Evaluation  06/24/18    Authorization Type  visit 5 of 10 for progress report period starting 04/01/2018    OT Start Time  1615    OT Stop Time  1700    OT Time Calculation (min)  45 min    Activity Tolerance  Patient tolerated treatment well    Behavior During Therapy  [facility] for tasks assessed/performed       Past Medical History:  Diagnosis Date  . Asthma   . Diabetes mellitus without complication ([place]) 2-77-41   hx. NIDDM-dx. 3 weeks ago  . Hyperlipidemia    hx  . Seasonal allergies   . Stroke ([place])   . Ulcerative colitis [facility])     Past Surgical History:  Procedure Laterality Date  . BUNIONECTOMY     bilateral and toe nails of big toes removed  . CESAREAN SECTION  1997/2006  . CHOLECYSTECTOMY N/A 12/01/2012   Procedure: LAPAROSCOPIC CHOLECYSTECTOMY;  Surgeon: Stark Klein, MD;  Location: [facility];  Service: General;  Laterality: N/A;  . COLONOSCOPY W/ POLYPECTOMY    . EYE SURGERY Bilateral 2013  . TONSILLECTOMY      There were no vitals filed for this visit.  Subjective Assessment - 04/27/18 1704    Subjective   Pt reports she has noticed increased swelling in her L hand with activity. Pt reports she is having a good day regardless of increased L shoulder pain.    Pertinent History  Pt is a 62 y.o. female presenting with LUE weakness and decreased [place] as a result of R basal ganglia CVA on 8/08. Pt was hospitalized for 1 week where she received inpatient therapy and was then discharged home where she received home health therapy for 2 weeks. Prior to CVA, pt was  independent for ADLs and IADLs. Currently, pt requires increased assistance, increased time, and/or modified strategies for ADLs and IADLs.     Patient Stated Goals  Pt wants to be able to do everthing with her L hand that her R hand can do, including making wigs, picking up laundry baskets, manipulating coins for shopping, and grooming hair.    Currently in Pain?  Yes    Pain Score  10-Worst pain ever    Pain Location  Shoulder   5/10 at rest; 0/10 with PROM in all planes   Pain Orientation  Left    Pain Descriptors / Indicators  Throbbing;Aching;Tightness    Pain Type  Chronic pain;Acute pain    Pain Onset  More than a month ago    Pain Frequency  Intermittent    Aggravating Factors   Moving L arm    Multiple Pain Sites  No       OT TREATMENT  Neuromuscular re-education:   Pt tolerated PROM of left shoulder while lying supine on mat.Pt reported pain of 10/10 today with any active movement of the left shoulder. Pt reported pain of 5/10 in her left UE while at rest and pain of 0/10 during PROM. Pt completed left shoulder stabilization exercise that required her to maintain  90 degrees of shoulder flexion against light pertubations in various directions with no pain LUE ROM. Shoulder stabilization is required to support functional independence during daily tasks including ADLs and IADLs.  Manual therapy:   Pt tolerated retrograde massage secondary to right hand edema while pt. was in supine with her hand supported in elevation. Retrograde massage was performed in preparation for ROM, and functional use of left hand during ADLs and IADLs. Decreased edema was noted after massage. Pt reported left hand felt less tight and demonstrated increased AROM of digits and hand. Pt was educated on techniques to use at home to address swelling.                OT Education - 04/27/18 1708    Education Details left shoulder ROM    Person(s) Educated  Patient    Methods  Explanation;Verbal  cues    Comprehension  Verbalized understanding;Verbal cues required          OT Long Term Goals - 04/01/18 1214      OT LONG TERM GOAL #1   Title  Pt will decreased [place] by 15 secs of speed in order to independently manipulate coins during grocery shopping    Baseline  Pt drops coins when checking out at grocery store; [facility] 3 mins 06 secs    Time  12    Period  Weeks    Status  New    Target Date  06/24/18      OT LONG TERM GOAL #2   Title  Pt will increase LUE strength by 1 mm level in order to independently pick up laundry baskets during IADLs    Baseline  Pt is unable to pick up laundry baskets; MMT LUE 3+/5    Time  12    Period  Weeks    Status  New    Target Date  06/24/18      OT LONG TERM GOAL #3   Title  Pt will increase L shoulder ROM by 10 degrees in efficiently bathe and dress UB    Baseline  Pt is unable to use L hand to bathe and dress efficiently; L AROM shoulder abd 52 degrees    Time  12    Period  Weeks    Status  New    Target Date  06/24/18      OT LONG TERM GOAL #4   Title  Pt will increase L lateral pinch by 3# in order to clip nails independently    Baseline  Pt is unable to use L hand to clip nails; L lateral pinch is 3#    Time  12    Period  Weeks    Status  New    Target Date  06/24/18      OT LONG TERM GOAL #5   Title  Pt will increase L 3 point pinch by 4# to be able to independently button small buttons    Baseline  Pt requires increased time to button clothes using L hand; L 3 point pinch is 2#    Time  12    Period  Weeks    Status  New    Target Date  06/24/18            Plan - 04/27/18 1709    Clinical Impression Statement Pt presents with 5/10 left shoulder pain at rest. Pt. presents with10/10 pain with any active movement. Pt reports AROM of left shoulder abduction is excruciating and  unbearable when attempting to initiate Active movement. Pt tolerates left  shoulder PROM without any pain.  Pt has increased edema in her left  hand today. Pt. Reports that it started after using her hands for an increased duration  of 3 hours this past weekend making a wig. Pt. Could benefit from an orthopedic consult. Pt. Continues to work on improving  Left UE functioning for improved independence with ADLs and IADLs.    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 Pt enjoys singing for leisure however has not been able to do so since experiencing CVA.    Occupational performance deficits (Please refer to evaluation for details):  ADL's;IADL's;Leisure    Rehab Potential  Good    Current Impairments/barriers affecting progress:  Positive: age, motivate, family suppory; Negative:     OT Frequency  2x / week    OT Duration  12 weeks    OT Treatment/Interventions  Self-care/ADL training;Therapeutic exercise;Neuromuscular education;Therapeutic activities;Manual Therapy;Passive range of motion    Clinical Decision Making  Several treatment options, min-mod task modification necessary    Consulted and Agree with Plan of Care  Patient       Patient will benefit from skilled therapeutic intervention in order to improve the following deficits and impairments:  Pain, Decreased cognition, Decreased range of motion, Decreased strength, Impaired perceived functional ability, Impaired UE functional use  Visit Diagnosis: Muscle weakness (generalized)    Problem List Patient Active Problem List   Diagnosis Date Noted  . Adjustment disorder with depressed mood 04/01/2018  . Healthcare maintenance 04/01/2018  . Essential hypertension 02/23/2018  . Migraine 02/23/2018  . HIV disease ([place])   . Stroke (cerebrum) ([place]) - R basal ganglia d/t small vessel dz 02/18/2018  . HLD (hyperlipidemia) 08/02/2014  . Type 2 diabetes mellitus ([place]) 07/31/2014  . Ulcerative colitis ([place]) 06/26/2008    Oliver Hum,  OTS 04/27/2018, 5:17 PM   This entire session was performed under direct supervision and direction of a licensed therapist/therapist assistant . I have personally read, edited and approve of the note as written.  Harrel Carina, MS, OTR/L    [place] MAIN [facility] SERVICES [address] [place], [place], [address] Phone: [phone]   Fax:  [phone]  Name: [name] MRN: [id number] Date of Birth: 24-Jul-1955

## 2018-04-28 ENCOUNTER — Ambulatory Visit: Payer: Self-pay | Admitting: *Deleted

## 2018-04-28 NOTE — Telephone Encounter (Signed)
Patient has been going to physical therapy- she is not getting better. She states PT have suggested that she may have injured her shoulder and may need referral to ortho. Patient is having shoulder pain that is hindering her pt- she is finding it hard to move and have full ROM. Patient is also concerned that the swelling is not going away in her hand and wrist. They did massaged it yesterday and that did help the swelling some- patient states the swelling in her hand is very painful.Patient keeps her hand elevated - but that does not help- the only thing that helped was the massage that they did yesterday.Patient is scheduled for Monday with R Baity- 30 minutes for evaluation of shoulder and arm. Told patient would send message for review and if she felt she could evaluate her in a shorter time slot they may call her back to move appointment up. Reason for Disposition . [1] Small area of LOCALIZED swelling AND [2] not better after 3 days  Answer Assessment - Initial Assessment Questions 1. ONSET: "When did the swelling start?" (e.g., minutes, hours, days)     Swelling started after the stroke and patient reports she is experiencing swelling so bad that her joints are hurting in hands and wrists. Patient does not have any strength in arm.  2. LOCATION: "What part of the arm is swollen?"  "Are both arms swollen or just one arm?"     Finger tips to wrist- possible swelling at forearm- left side 3. SEVERITY: "How bad is the swelling?" (e.g., localized; mild, moderate, severe)   - LOCALIZED: Small area of puffiness or swelling on just one arm   - JOINT SWELLING: Swelling of one joint   - MILD: Puffiness or swelling of hand   - MODERATE: Puffiness or swollen feeling of entire arm    - SEVERE: All of arm looks swollen; pitting edema     mild 4. REDNESS: "Does the swelling look red or infected?"     no 5. PAIN: "Is the swelling painful to touch?" If so, ask: "How painful is it?"   (Scale 1-10; mild,  moderate or severe)     Touch, lift up- if moves it - 10 6. FEVER: "Do you have a fever?" If so, ask: "What is it, how was it measured, and when did it start?"      no 7. CAUSE: "What do you think is causing the arm swelling?"     Stroke in August- circulation 8. MEDICAL HISTORY: "Do you have a history of heart failure, kidney disease, liver failure, or cancer?"     Stroke hx 9. RECURRENT SYMPTOM: "Have you had arm swelling before?" If so, ask: "When was the last time?" "What happened that time?"     No swelling before the stroke 10. OTHER SYMPTOMS: "Do you have any other symptoms?" (e.g., chest pain, difficulty breathing)       no 11. PREGNANCY: "Is there any chance you are pregnant?" "When was your last menstrual period?"       n/a  Protocols used: ARM SWELLING AND EDEMA-A-AH

## 2018-04-29 ENCOUNTER — Ambulatory Visit: Payer: BLUE CROSS/BLUE SHIELD | Admitting: Occupational Therapy

## 2018-04-29 ENCOUNTER — Encounter: Payer: Self-pay | Admitting: Occupational Therapy

## 2018-04-29 DIAGNOSIS — R278 Other lack of coordination: Secondary | ICD-10-CM

## 2018-04-29 DIAGNOSIS — M6281 Muscle weakness (generalized): Secondary | ICD-10-CM | POA: Diagnosis not present

## 2018-04-29 NOTE — Therapy (Addendum)
[place] MAIN [facility] SERVICES [address] [place], [place], [address] Phone: [phone]   Fax:  [phone]  Occupational Therapy Treatment  Patient Details  Name: [name] MRN: [id number] Date of Birth: 09/26/1955 No data recorded  Encounter Date: 04/29/2018  OT End of Session - 04/29/18 1735    Visit Number  6    Number of Visits  24    Date for OT Re-Evaluation  06/24/18    Authorization Type  visit 6 of 10 for progress report period starting 04/01/2018    OT Start Time  1651    OT Stop Time  1730    OT Time Calculation (min)  39 min    Activity Tolerance  Patient tolerated treatment well    Behavior During Therapy  [facility] for tasks assessed/performed       Past Medical History:  Diagnosis Date  . Asthma   . Diabetes mellitus without complication ([place]) 4-43-15   hx. NIDDM-dx. 3 weeks ago  . Hyperlipidemia    hx  . Seasonal allergies   . Stroke ([place])   . Ulcerative colitis [facility])     Past Surgical History:  Procedure Laterality Date  . BUNIONECTOMY     bilateral and toe nails of big toes removed  . CESAREAN SECTION  1997/2006  . CHOLECYSTECTOMY N/A 12/01/2012   Procedure: LAPAROSCOPIC CHOLECYSTECTOMY;  Surgeon: Stark Klein, MD;  Location: [facility];  Service: General;  Laterality: N/A;  . COLONOSCOPY W/ POLYPECTOMY    . EYE SURGERY Bilateral 2013  . TONSILLECTOMY      There were no vitals filed for this visit.  Subjective Assessment - 04/29/18 1734    Subjective   Pt reports that she is experiencing increased pain in her LUE and has scheduled an appt with her PCP.    Pertinent History  Pt is a 62 y.o. female presenting with LUE weakness and decreased [place] as a result of R basal ganglia CVA on 8/08. Pt was hospitalized for 1 week where she received inpatient therapy and was then discharged home where she received home health therapy for 2 weeks. Prior to CVA, pt was independent for ADLs and IADLs. Currently, pt requires  increased assistance, increased time, and/or modified strategies for ADLs and IADLs.     Patient Stated Goals  Pt wants to be able to do everthing with her L hand that her R hand can do, including making wigs, picking up laundry baskets, manipulating coins for shopping, and grooming hair.    Currently in Pain?  Yes    Pain Score  5     Pain Location  Arm    Pain Orientation  Left    Pain Descriptors / Indicators  Aching;Throbbing;Tightness    Pain Type  Acute pain    Pain Onset  More than a month ago    Pain Frequency  Intermittent    Aggravating Factors   Moving L arm    Multiple Pain Sites  No       OT TREATMENT  Neuromuscular re-education:  Pt completed [facility] task that required her to manipulate [place] style pegs. Pt required verbal and tactile cuing to use thumb opposed to 3rd digit to pick up pegs and turn over using 2nd digit. Pt was unable to complete this movement today. Pt dropped x3 pegs during the task. Pt stacked x4 pegs for x3 trials without dropping pegs. Pt completed the same exercises with checkers pieces. Pt required increased time to complete  with checkers. Pt used a gross grasp and slid them to the edge of the table to pick up pieces before turning them over. Pt required cuing to placed pieces on the tabletop opposed to dropping them.                    OT Education - 04/29/18 1735    Education Details  L hand [place] skills    Person(s) Educated  Patient    Methods  Explanation;Verbal cues    Comprehension  Verbalized understanding;Verbal cues required          OT Long Term Goals - 04/01/18 1214      OT LONG TERM GOAL #1   Title  Pt will decreased [place] by 15 secs of speed in order to independently manipulate coins during grocery shopping    Baseline  Pt drops coins when checking out at grocery store; [facility] 3 mins 06 secs    Time  12    Period  Weeks    Status  New    [place] Date  06/24/18      OT LONG TERM GOAL #2   Title  Pt will increase LUE  strength by 1 mm level in order to independently pick up laundry baskets during IADLs    Baseline  Pt is unable to pick up laundry baskets; MMT LUE 3+/5    Time  12    Period  Weeks    Status  New    [place] Date  06/24/18      OT LONG TERM GOAL #3   Title  Pt will increase L shoulder ROM by 10 degrees in efficiently bathe and dress UB    Baseline  Pt is unable to use L hand to bathe and dress efficiently; L AROM shoulder abd 52 degrees    Time  12    Period  Weeks    Status  New    [place] Date  06/24/18      OT LONG TERM GOAL #4   Title  Pt will increase L lateral pinch by 3# in order to clip nails independently    Baseline  Pt is unable to use L hand to clip nails; L lateral pinch is 3#    Time  12    Period  Weeks    Status  New    [place] Date  06/24/18      OT LONG TERM GOAL #5   Title  Pt will increase L 3 point pinch by 4# to be able to independently button small buttons    Baseline  Pt requires increased time to button clothes using L hand; L 3 point pinch is 2#    Time  12    Period  Weeks    Status  New    [place] Date  06/24/18            Plan - 04/29/18 1736    Clinical Impression Statement  Pt. has scheduled an MD visit  with her primary care physician secondary to LUE pain. Pt. presents with 5/10 left shoulder pain at rest,  Pt has increased edema in the palmar surface of her left hand today. Pt. education was provided about edema control techniques for the left hand.  Pt. focused on [facility] skills during this treatment session. Pt continues to present with [facility] limitations. Pt has difficulties manipulating buttons and small items throughout her day. Pt continues to work on improving LUE [facility] skills. Increased [facility]  skills is required for improved independence with ADLs and IADLs.    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 Pt enjoys  singing for leisure however has not been able to do so since experiencing CVA.    Occupational performance deficits (Please refer to evaluation for details):  ADL's;IADL's;Leisure    Rehab Potential  Good    Current Impairments/barriers affecting progress:  Positive: age, motivate, family suppory; Negative:     OT Frequency  2x / week    OT Duration  12 weeks    OT Treatment/Interventions  Self-care/ADL training;Therapeutic exercise;Neuromuscular education;Therapeutic activities;Manual Therapy;Passive range of motion    Clinical Decision Making  Several treatment options, min-mod task modification necessary    Consulted and Agree with Plan of Care  Patient       Patient will benefit from skilled therapeutic intervention in order to improve the following deficits and impairments:  Pain, Decreased cognition, Decreased range of motion, Decreased strength, Impaired perceived functional ability, Impaired UE functional use  Visit Diagnosis: Other lack of coordination    Problem List Patient Active Problem List   Diagnosis Date Noted  . Adjustment disorder with depressed mood 04/01/2018  . Healthcare maintenance 04/01/2018  . Essential hypertension 02/23/2018  . Migraine 02/23/2018  . HIV disease ([place])   . Stroke (cerebrum) ([place]) - R basal ganglia d/t small vessel dz 02/18/2018  . HLD (hyperlipidemia) 08/02/2014  . Type 2 diabetes mellitus ([place]) 07/31/2014  . Ulcerative colitis ([place]) 06/26/2008    Oliver Hum, OTS 04/29/2018, 5:57 PM   This entire session was performed under direct supervision and direction of a licensed therapist/therapist assistant . I have personally read, edited and approve of the note as written.  Harrel Carina, MS, OTR/L   [place] MAIN [facility] SERVICES [address] [place], [place], [address] Phone: [phone]   Fax:  [phone]  Name: [name] MRN: [id number] Date of Birth: 1956/05/15

## 2018-05-03 ENCOUNTER — Ambulatory Visit (INDEPENDENT_AMBULATORY_CARE_PROVIDER_SITE_OTHER)
Admission: RE | Admit: 2018-05-03 | Discharge: 2018-05-03 | Disposition: A | Discharge status: Home / Self Care | Payer: BLUE CROSS/BLUE SHIELD | Source: Ambulatory Visit | Attending: Internal Medicine | Admitting: Internal Medicine

## 2018-05-03 ENCOUNTER — Ambulatory Visit (INDEPENDENT_AMBULATORY_CARE_PROVIDER_SITE_OTHER): Payer: BLUE CROSS/BLUE SHIELD | Admitting: Internal Medicine

## 2018-05-03 ENCOUNTER — Other Ambulatory Visit: Payer: Self-pay | Admitting: Internal Medicine

## 2018-05-03 ENCOUNTER — Encounter: Payer: Self-pay | Admitting: Internal Medicine

## 2018-05-03 VITALS — BP 120/80 | HR 77 | Temp 98.2°F | Wt 140.0 lb

## 2018-05-03 DIAGNOSIS — M25512 Pain in left shoulder: Secondary | ICD-10-CM | POA: Diagnosis not present

## 2018-05-03 DIAGNOSIS — M19032 Primary osteoarthritis, left wrist: Secondary | ICD-10-CM | POA: Diagnosis not present

## 2018-05-03 DIAGNOSIS — M7989 Other specified soft tissue disorders: Secondary | ICD-10-CM

## 2018-05-03 DIAGNOSIS — G8929 Other chronic pain: Secondary | ICD-10-CM | POA: Diagnosis not present

## 2018-05-03 DIAGNOSIS — M25532 Pain in left wrist: Secondary | ICD-10-CM

## 2018-05-03 DIAGNOSIS — M79602 Pain in left arm: Secondary | ICD-10-CM | POA: Diagnosis not present

## 2018-05-03 NOTE — Patient Instructions (Signed)
Shoulder Exercises Ask your health care provider which exercises are safe for you. Do exercises exactly as told by your health care provider and adjust them as directed. It is normal to feel mild stretching, pulling, tightness, or discomfort as you do these exercises, but you should stop right away if you feel sudden pain or your pain gets worse.Do not begin these exercises until told by your health care provider. RANGE OF MOTION EXERCISES These exercises warm up your muscles and joints and improve the movement and flexibility of your shoulder. These exercises also help to relieve pain, numbness, and tingling. These exercises involve stretching your injured shoulder directly. Exercise A: Pendulum  1. Stand near a wall or a surface that you can hold onto for balance. 2. Bend at the waist and let your left / right arm hang straight down. Use your other arm to support you. Keep your back straight and do not lock your knees. 3. Relax your left / right arm and shoulder muscles, and move your hips and your trunk so your left / right arm swings freely. Your arm should swing because of the motion of your body, not because you are using your arm or shoulder muscles. 4. Keep moving your body so your arm swings in the following directions, as told by your health care provider: ? Side to side. ? Forward and backward. ? In clockwise and counterclockwise circles. 5. Continue each motion for __________ seconds, or for as long as told by your health care provider. 6. Slowly return to the starting position. Repeat __________ times. Complete this exercise __________ times a day. Exercise B:Flexion, Standing  1. Stand and hold a broomstick, a cane, or a similar object. Place your hands a little more than shoulder-width apart on the object. Your left / right hand should be palm-up, and your other hand should be palm-down. 2. Keep your elbow straight and keep your shoulder muscles relaxed. Push the stick down with  your healthy arm to raise your left / right arm in front of your body, and then over your head until you feel a stretch in your shoulder. ? Avoid shrugging your shoulder while you raise your arm. Keep your shoulder blade tucked down toward the middle of your back. 3. Hold for __________ seconds. 4. Slowly return to the starting position. Repeat __________ times. Complete this exercise __________ times a day. Exercise C: Abduction, Standing 1. Stand and hold a broomstick, a cane, or a similar object. Place your hands a little more than shoulder-width apart on the object. Your left / right hand should be palm-up, and your other hand should be palm-down. 2. While keeping your elbow straight and your shoulder muscles relaxed, push the stick across your body toward your left / right side. Raise your left / right arm to the side of your body and then over your head until you feel a stretch in your shoulder. ? Do not raise your arm above shoulder height, unless your health care provider tells you to do that. ? Avoid shrugging your shoulder while you raise your arm. Keep your shoulder blade tucked down toward the middle of your back. 3. Hold for __________ seconds. 4. Slowly return to the starting position. Repeat __________ times. Complete this exercise __________ times a day. Exercise D:Internal Rotation  1. Place your left / right hand behind your back, palm-up. 2. Use your other hand to dangle an exercise band, a towel, or a similar object over your shoulder. Grasp the band with   your left / right hand so you are holding onto both ends. 3. Gently pull up on the band until you feel a stretch in the front of your left / right shoulder. ? Avoid shrugging your shoulder while you raise your arm. Keep your shoulder blade tucked down toward the middle of your back. 4. Hold for __________ seconds. 5. Release the stretch by letting go of the band and lowering your hands. Repeat __________ times. Complete  this exercise __________ times a day. STRETCHING EXERCISES These exercises warm up your muscles and joints and improve the movement and flexibility of your shoulder. These exercises also help to relieve pain, numbness, and tingling. These exercises are done using your healthy shoulder to help stretch the muscles of your injured shoulder. Exercise E: Corner Stretch (External Rotation and Abduction)  1. Stand in a doorway with one of your feet slightly in front of the other. This is called a staggered stance. If you cannot reach your forearms to the door frame, stand facing a corner of a room. 2. Choose one of the following positions as told by your health care provider: ? Place your hands and forearms on the door frame above your head. ? Place your hands and forearms on the door frame at the height of your head. ? Place your hands on the door frame at the height of your elbows. 3. Slowly move your weight onto your front foot until you feel a stretch across your chest and in the front of your shoulders. Keep your head and chest upright and keep your abdominal muscles tight. 4. Hold for __________ seconds. 5. To release the stretch, shift your weight to your back foot. Repeat __________ times. Complete this stretch __________ times a day. Exercise F:Extension, Standing 1. Stand and hold a broomstick, a cane, or a similar object behind your back. ? Your hands should be a little wider than shoulder-width apart. ? Your palms should face away from your back. 2. Keeping your elbows straight and keeping your shoulder muscles relaxed, move the stick away from your body until you feel a stretch in your shoulder. ? Avoid shrugging your shoulders while you move the stick. Keep your shoulder blade tucked down toward the middle of your back. 3. Hold for __________ seconds. 4. Slowly return to the starting position. Repeat __________ times. Complete this exercise __________ times a day. STRENGTHENING  EXERCISES These exercises build strength and endurance in your shoulder. Endurance is the ability to use your muscles for a long time, even after they get tired. Exercise G:External Rotation  1. Sit in a stable chair without armrests. 2. Secure an exercise band at elbow height on your left / right side. 3. Place a soft object, such as a folded towel or a small pillow, between your left / right upper arm and your body to move your elbow a few inches away (about 10 cm) from your side. 4. Hold the end of the band so it is tight and there is no slack. 5. Keeping your elbow pressed against the soft object, move your left / right forearm out, away from your abdomen. Keep your body steady so only your forearm moves. 6. Hold for __________ seconds. 7. Slowly return to the starting position. Repeat __________ times. Complete this exercise __________ times a day. Exercise H:Shoulder Abduction  1. Sit in a stable chair without armrests, or stand. 2. Hold a __________ weight in your left / right hand, or hold an exercise band with both hands.   3. Start with your arms straight down and your left / right palm facing in, toward your body. 4. Slowly lift your left / right hand out to your side. Do not lift your hand above shoulder height unless your health care provider tells you that this is safe. ? Keep your arms straight. ? Avoid shrugging your shoulder while you do this movement. Keep your shoulder blade tucked down toward the middle of your back. 5. Hold for __________ seconds. 6. Slowly lower your arm, and return to the starting position. Repeat __________ times. Complete this exercise __________ times a day. Exercise I:Shoulder Extension 1. Sit in a stable chair without armrests, or stand. 2. Secure an exercise band to a stable object in front of you where it is at shoulder height. 3. Hold one end of the exercise band in each hand. Your palms should face each other. 4. Straighten your elbows and  lift your hands up to shoulder height. 5. Step back, away from the secured end of the exercise band, until the band is tight and there is no slack. 6. Squeeze your shoulder blades together as you pull your hands down to the sides of your thighs. Stop when your hands are straight down by your sides. Do not let your hands go behind your body. 7. Hold for __________ seconds. 8. Slowly return to the starting position. Repeat __________ times. Complete this exercise __________ times a day. Exercise J:Standing Shoulder Row 1. Sit in a stable chair without armrests, or stand. 2. Secure an exercise band to a stable object in front of you so it is at waist height. 3. Hold one end of the exercise band in each hand. Your palms should be in a thumbs-up position. 4. Bend each of your elbows to an "L" shape (about 90 degrees) and keep your upper arms at your sides. 5. Step back until the band is tight and there is no slack. 6. Slowly pull your elbows back behind you. 7. Hold for __________ seconds. 8. Slowly return to the starting position. Repeat __________ times. Complete this exercise __________ times a day. Exercise K:Shoulder Press-Ups  1. Sit in a stable chair that has armrests. Sit upright, with your feet flat on the floor. 2. Put your hands on the armrests so your elbows are bent and your fingers are pointing forward. Your hands should be about even with the sides of your body. 3. Push down on the armrests and use your arms to lift yourself off of the chair. Straighten your elbows and lift yourself up as much as you comfortably can. ? Move your shoulder blades down, and avoid letting your shoulders move up toward your ears. ? Keep your feet on the ground. As you get stronger, your feet should support less of your body weight as you lift yourself up. 4. Hold for __________ seconds. 5. Slowly lower yourself back into the chair. Repeat __________ times. Complete this exercise __________ times a  day. Exercise L: Wall Push-Ups  1. Stand so you are facing a stable wall. Your feet should be about one arm-length away from the wall. 2. Lean forward and place your palms on the wall at shoulder height. 3. Keep your feet flat on the floor as you bend your elbows and lean forward toward the wall. 4. Hold for __________ seconds. 5. Straighten your elbows to push yourself back to the starting position. Repeat __________ times. Complete this exercise __________ times a day. This information is not intended to replace advice   given to you by your health care provider. Make sure you discuss any questions you have with your health care provider. Document Released: 05/14/2005 Document Revised: 03/24/2016 Document Reviewed: 03/11/2015 [employer] Interactive Patient Education  2018 [employer]  

## 2018-05-03 NOTE — Progress Notes (Signed)
Subjective:    Patient ID: [name], female    DOB: 1956/07/08, 62 y.o.   MRN: [id number]  HPI  Pt presents to the clinic today with c/o left shoulder, wrist and hand pain. She reports this started 2 months ago right after her stroke. She describes the pain as pulling. She denies numbness or tingling but has had some weakness and swelling. She had a stroke 02/2018, and has been undergoing PT since that time. She has spoken to her neurologist about this who reports this pain should not be related to her stroke. She has tried cool compresses but has not tried anything OTC for this. She denies prior injury to left arm prior.  Review of Systems      Past Medical History:  Diagnosis Date  . Asthma   . Diabetes mellitus without complication ([place]) 2-68-34   hx. NIDDM-dx. 3 weeks ago  . Hyperlipidemia    hx  . Seasonal allergies   . Stroke ([place])   . Ulcerative colitis ([place])     Current Outpatient Medications  Medication Sig Dispense Refill  . acetaminophen (TYLENOL) 325 MG tablet Take 2 tablets (650 mg total) by mouth every 6 (six) hours as needed for mild pain (or temp > 37.5 C (99.5 F)).    Marland Kitchen albuterol (PROVENTIL HFA;VENTOLIN HFA) 108 (90 Base) MCG/ACT inhaler Inhale 2 puffs into the lungs every 6 (six) hours as needed for wheezing or shortness of breath. 1 Inhaler 0  . aspirin EC 81 MG EC tablet Take 1 tablet (81 mg total) by mouth daily.    Marland Kitchen atorvastatin (LIPITOR) 40 MG tablet TAKE 1 TABLET (40 MG TOTAL) BY MOUTH DAILY AT 6 PM. 30 tablet 0  . bictegravir-emtricitabine-tenofovir AF (BIKTARVY) 50-200-25 MG TABS tablet Take 1 tablet by mouth daily. 30 tablet 5  . citalopram (CELEXA) 10 MG tablet Take 1 tablet (10 mg total) by mouth at bedtime. 30 tablet 2  . diclofenac sodium (VOLTAREN) 1 % GEL Apply 1 application topically 3 (three) times daily. Left knee 3 Tube 4  . glipiZIDE (GLUCOTROL) 10 MG tablet Take 1 tablet (10 mg total) by mouth 2 (two) times daily before a meal. 60  tablet 3  . glucose blood (FREESTYLE LITE) test strip 1 each by Other route 2 (two) times daily. 100 each 5  . potassium chloride SA (K-DUR,KLOR-CON) 20 MEQ tablet Take 1 tablet (20 mEq total) by mouth daily. 30 tablet 0  . topiramate (TOPAMAX) 50 MG tablet Take 1 tablet (50 mg total) by mouth at bedtime. 30 tablet 2   No current facility-administered medications for this visit.     Allergies  Allergen Reactions  . Azithromycin Itching    Muscle aches  . Erythromycin Nausea And Vomiting  . Penicillins Nausea And Vomiting    Has patient had a PCN reaction causing immediate rash, facial/tongue/throat swelling, SOB or lightheadedness with hypotension: Yes Has patient had a PCN reaction causing severe rash involving mucus membranes or skin necrosis: No Has patient had a PCN reaction that required hospitalization No Has patient had a PCN reaction occurring within the last 10 years: No If all of the above answers are "NO", then may proceed with Cephalosporin use.   . Tetracycline Nausea And Vomiting    Family History  Problem Relation Age of Onset  . Hypertension Mother   . Diabetes Mother   . Cancer Father        lung ca  . Seizures Daughter  2yr    Social History   Socioeconomic History  . Marital status: Legally Separated    Spouse name: Not on file  . Number of children: Not on file  . Years of education: Not on file  . Highest education level: Not on file  Occupational History  . Not on file  Social Needs  . Financial resource strain: Not on file  . Food insecurity:    Worry: Not on file    Inability: Not on file  . Transportation needs:    Medical: Not on file    Non-medical: Not on file  Tobacco Use  . Smoking status: Never Smoker  . Smokeless tobacco: Never Used  Substance and Sexual Activity  . Alcohol use: Not Currently    Alcohol/week: 0.0 standard drinks    Comment: occasional  . Drug use: No  . Sexual activity: Not Currently    Partners: Male      Comment: offered condoms  Lifestyle  . Physical activity:    Days per week: Not on file    Minutes per session: Not on file  . Stress: Not on file  Relationships  . Social connections:    Talks on phone: Not on file    Gets together: Not on file    Attends religious service: Not on file    Active member of club or organization: Not on file    Attends meetings of clubs or organizations: Not on file    Relationship status: Not on file  . Intimate partner violence:    Fear of current or ex partner: Not on file    Emotionally abused: Not on file    Physically abused: Not on file    Forced sexual activity: Not on file  Other Topics Concern  . Not on file  Social History Narrative  . Not on file     Constitutional: Denies fever, malaise, fatigue, headache or abrupt weight changes.  Musculoskeletal: Pt reports left arm pain and swelling. Denies difficulty with gait, muscle pain.  Skin: Denies redness, rashes, lesions or ulcercations.  Neurological: Denies dizziness, difficulty with memory, difficulty with speech or problems with balance and coordination.   No other specific complaints in a complete review of systems (except as listed in HPI above).  Objective:   Physical Exam   BP 120/80   Pulse 77   Temp 98.2 F (36.8 C) (Oral)   Wt 140 lb (63.5 kg)   BMI 25.61 kg/m  Wt Readings from Last 3 Encounters:  05/03/18 140 lb (63.5 kg)  04/06/18 139 lb 12.8 oz (63.4 kg)  03/29/18 140 lb 9.6 oz (63.8 kg)    General: Appears her stated age, well developed, well nourished in NAD. Cardiovascular: Radial pulse 2+ bilaterally. Pulmonary/Chest: Normal effort and positive vesicular breath sounds. No respiratory distress. No wheezes, rales or ronchi noted.  Musculoskeletal: Normal flexion, extension and rotation of the cervical spine. No bony tenderness noted over the spine. Decreased internal and external rotation of the left shoulder due to pain. Pain with palpation over the left  AC joint, trapezius, subacromial bursa and anterior proximal biceps tendon. Decreased flexion, extension and rotation of the left elbow due to pain. Decreased flexion, extension and rotation of the left wrist due to pain. Swelling noted of 2nd, 3rd, 4th fingers. Strength 4/5 LUE, 5/5 RUE. Hand grips unequal, R>L. No difficulty with gait.  Neurological: Alert and oriented. Sensation intact to BUE. Fine motor coordination decreased LUE. BMET    Component Value  Date/Time   NA 139 03/02/2018 0641   NA 138 06/23/2014 0503   K 3.8 04/26/2018 1436   K 4.2 06/23/2014 0503   CL 106 03/02/2018 0641   CL 104 06/23/2014 0503   CO2 24 03/02/2018 0641   CO2 27 06/23/2014 0503   GLUCOSE 133 (H) 03/02/2018 0641   GLUCOSE 246 (H) 06/23/2014 0503   BUN 14 03/02/2018 0641   BUN 17 06/23/2014 0503   CREATININE 1.17 (H) 03/02/2018 0641   CREATININE 0.86 06/23/2014 0503   CALCIUM 9.1 03/02/2018 0641   CALCIUM 8.7 06/23/2014 0503   GFRNONAA 49 (L) 03/02/2018 0641   GFRNONAA >60 06/23/2014 0503   GFRAA 57 (L) 03/02/2018 0641   GFRAA >60 06/23/2014 0503    Lipid Panel     Component Value Date/Time   CHOL 243 (H) 02/19/2018 0301   CHOL 211 (H) 06/22/2014 1432   TRIG 349 (H) 02/19/2018 0301   TRIG 473 (H) 06/22/2014 1432   HDL 37 (L) 02/19/2018 0301   HDL 42 06/22/2014 1432   CHOLHDL 6.6 02/19/2018 0301   VLDL 70 (H) 02/19/2018 0301   VLDL SEE COMMENT 06/22/2014 1432   LDLCALC 136 (H) 02/19/2018 0301   LDLCALC SEE COMMENT 06/22/2014 1432    CBC    Component Value Date/Time   WBC 4.7 03/02/2018 0641   RBC 4.09 03/02/2018 0641   HGB 11.4 (L) 03/02/2018 0641   HGB 12.2 06/23/2014 0503   HCT 35.8 (L) 03/02/2018 0641   HCT 37.2 06/23/2014 0503   PLT 253 03/02/2018 0641   PLT 251 06/23/2014 0503   MCV 87.5 03/02/2018 0641   MCV 89 06/23/2014 0503   MCH 27.9 03/02/2018 0641   MCHC 31.8 03/02/2018 0641   RDW 12.9 03/02/2018 0641   RDW 13.6 06/23/2014 0503   LYMPHSABS 1.8 02/23/2018 2112    LYMPHSABS 1.8 06/23/2014 0503   MONOABS 0.7 02/23/2018 2112   MONOABS 0.4 06/23/2014 0503   EOSABS 0.0 02/23/2018 2112   EOSABS 0.0 06/23/2014 0503   BASOSABS 0.0 02/23/2018 2112   BASOSABS 0.0 06/23/2014 0503    Hgb A1C Lab Results  Component Value Date   HGBA1C 9.1 (H) 02/19/2018           Assessment & Plan:   Left Arm Pain., Left Shoulder Pain, Left Wrist Pain, Swelling of Left Hand:  Will obtain xray of left shoulder and left wrist Encouraged elevation, Ibuprofen 400 mg Q8H prn Advised ice for 10 minutes BID May need referral to orthopedics  Will follow up after xray, return precautions discussed Webb Silversmith, NP

## 2018-05-04 ENCOUNTER — Ambulatory Visit: Payer: BLUE CROSS/BLUE SHIELD | Admitting: Occupational Therapy

## 2018-05-04 ENCOUNTER — Ambulatory Visit: Payer: Self-pay

## 2018-05-04 NOTE — Telephone Encounter (Signed)
Patient called and says "I was seen by [facility] yesterday for shoulder, wrist, neck pain. She asked did I have pain when I turned my head. I told her no because I didn't know why she was asking me. I do have pain in my neck and it is hard to turn. Does that mean I am having another stroke and should I go to the emergency room?" She was asking as part of her assessment, so she will know what is going on. She says "well, let her know I am having pain and the pain in my wrist and shoulder is not any better. I can't even do occupational therapy because it hurts so bad." I advised of the recommendations to take Ibuprofen 400 mg Q8H, Ice to area x 10 min BID and possible orthopedic referral. Patient says "I didn't take Ibuprofen, because I didn't know how much to take, so I will start that and ice. I would like the orthopedic referral, anything to help this pain." I advised I will send in her request for orthopedic referral to [facility] and someone will call back with the appointment, she verbalized understanding.   Reason for Disposition . Health Information question, no triage required and triager able to answer question  Protocols used: INFORMATION ONLY CALL-A-AH

## 2018-05-05 NOTE — Telephone Encounter (Signed)
Her potassium was normal, does she need to continue? I do not see that she is on a diuretic... Please advise

## 2018-05-06 ENCOUNTER — Other Ambulatory Visit: Payer: Self-pay | Admitting: Internal Medicine

## 2018-05-06 ENCOUNTER — Encounter: Payer: Self-pay | Admitting: Occupational Therapy

## 2018-05-06 ENCOUNTER — Ambulatory Visit: Payer: BLUE CROSS/BLUE SHIELD | Admitting: Occupational Therapy

## 2018-05-06 DIAGNOSIS — M6281 Muscle weakness (generalized): Secondary | ICD-10-CM | POA: Diagnosis not present

## 2018-05-06 DIAGNOSIS — M25532 Pain in left wrist: Secondary | ICD-10-CM

## 2018-05-06 DIAGNOSIS — M25512 Pain in left shoulder: Secondary | ICD-10-CM

## 2018-05-06 DIAGNOSIS — G8929 Other chronic pain: Secondary | ICD-10-CM

## 2018-05-06 DIAGNOSIS — R278 Other lack of coordination: Secondary | ICD-10-CM

## 2018-05-06 DIAGNOSIS — M79602 Pain in left arm: Secondary | ICD-10-CM

## 2018-05-06 NOTE — Telephone Encounter (Signed)
Yes should continue potassium. Hx of hypokalemia, not diuretic induced.

## 2018-05-06 NOTE — Therapy (Addendum)
[place] MAIN [facility] SERVICES [address] [place], [place], [address] Phone: [phone]   Fax:  [phone]  Occupational Therapy Treatment  Patient Details  Name: [name] MRN: [id number] Date of Birth: September 22, 1955 No data recorded  Encounter Date: 05/06/2018  OT End of Session - 05/06/18 1320    Visit Number  7    Number of Visits  24    Date for OT Re-Evaluation  06/24/18    Authorization Type  visit 7 of 10 for progress report period starting 04/01/2018    OT Start Time  1100    OT Stop Time  1145    OT Time Calculation (min)  45 min    Activity Tolerance  Patient tolerated treatment well    Behavior During Therapy  [facility] for tasks assessed/performed       Past Medical History:  Diagnosis Date  . Asthma   . Diabetes mellitus without complication ([place]) 2-29-79   hx. NIDDM-dx. 3 weeks ago  . Hyperlipidemia    hx  . Seasonal allergies   . Stroke ([place])   . Ulcerative colitis [facility])     Past Surgical History:  Procedure Laterality Date  . BUNIONECTOMY     bilateral and toe nails of big toes removed  . CESAREAN SECTION  1997/2006  . CHOLECYSTECTOMY N/A 12/01/2012   Procedure: LAPAROSCOPIC CHOLECYSTECTOMY;  Surgeon: Stark Klein, MD;  Location: [facility] ORS;  Service: General;  Laterality: N/A;  . COLONOSCOPY W/ POLYPECTOMY    . EYE SURGERY Bilateral 2013  . TONSILLECTOMY      There were no vitals filed for this visit.  Subjective Assessment - 05/06/18 1318    Subjective   Pt reports that she is feeling a little better after using OTC medication and ice for LUE pain.    Pertinent History  Pt is a 63 y.o. female presenting with LUE weakness and decreased [place] as a result of R basal ganglia CVA on 8/08. Pt was hospitalized for 1 week where she received inpatient therapy and was then discharged home where she received home health therapy for 2 weeks. Prior to CVA, pt was independent for ADLs and IADLs. Currently, pt requires  increased assistance, increased time, and/or modified strategies for ADLs and IADLs.     Patient Stated Goals  Pt wants to be able to do everthing with her L hand that her R hand can do, including making wigs, picking up laundry baskets, manipulating coins for shopping, and grooming hair.    Currently in Pain?  Yes    Pain Score  2     Pain Location  Arm    Pain Orientation  Left    Pain Descriptors / Indicators  Aching    Pain Type  Acute pain    Pain Onset  More than a month ago    Pain Frequency  Intermittent    Aggravating Factors   Moving L arm    Pain Relieving Factors  OTC medication and ice    Multiple Pain Sites  No       OT TREATMENT  Manual therapy:   Pt tolerated retrograde massage on the L digits, hand, and wrist. Decreased edema was noted after completing massage and pt demonstrated increased AROM.   Therapeutic exercise:   Pt worked on yellow theraputty exercises for hand strengthening. Exercises included: gross gripping, gross digit extension, lateral, and 3pt. pinch strengthening, digit abduction, and thumb opposition. Pt reported owning yellow theraputty and was encouraged  to use it at home for strengthening. Pt was familiar with exercises covered during today's treatment and demonstrated proper form and technique after review.  Neuromuscular re-education:   Pt. worked on left hand motor control, and coordination skills needed to grasp 1/2" washers and place them in a neat configuration on the tabletop. Pt required verbal cuing to use thumb opposed to 2nd and 3rd digit when grasping washers. Pt was able to stack x5 washers using her left hand. Pt demonstrated decreased coordination and motor control as the task progressed.                    OT Education - 05/06/18 1320    Education Details  L hand strengthening, [place] skills    Person(s) Educated  Patient    Methods  Explanation;Verbal cues    Comprehension  Verbalized understanding;Verbal cues  required          OT Long Term Goals - 04/01/18 1214      OT LONG TERM GOAL #1   Title  Pt will decreased [place] by 15 secs of speed in order to independently manipulate coins during grocery shopping    Baseline  Pt drops coins when checking out at grocery store; [facility] 3 mins 06 secs    Time  12    Period  Weeks    Status  New    Target Date  06/24/18      OT LONG TERM GOAL #2   Title  Pt will increase LUE strength by 1 mm level in order to independently pick up laundry baskets during IADLs    Baseline  Pt is unable to pick up laundry baskets; MMT LUE 3+/5    Time  12    Period  Weeks    Status  New    Target Date  06/24/18      OT LONG TERM GOAL #3   Title  Pt will increase L shoulder ROM by 10 degrees in efficiently bathe and dress UB    Baseline  Pt is unable to use L hand to bathe and dress efficiently; L AROM shoulder abd 52 degrees    Time  12    Period  Weeks    Status  New    Target Date  06/24/18      OT LONG TERM GOAL #4   Title  Pt will increase L lateral pinch by 3# in order to clip nails independently    Baseline  Pt is unable to use L hand to clip nails; L lateral pinch is 3#    Time  12    Period  Weeks    Status  New    Target Date  06/24/18      OT LONG TERM GOAL #5   Title  Pt will increase L 3 point pinch by 4# to be able to independently button small buttons    Baseline  Pt requires increased time to button clothes using L hand; L 3 point pinch is 2#    Time  12    Period  Weeks    Status  New    Target Date  06/24/18            Plan - 05/06/18 1321    Clinical Impression Statement Pt reports decreased LUE pain after her appt with her PCP. Pt. Reports using OTC medication and ice. Pt. Has an appointment to follow-up with an orthopedic physician. Pt presents with edema throughout her left  digits, hand, and wrist. Pt worked on [facility] skills and strengthening today. Pt continues to present with decreased [place] and strenghth in the LUE. Increased FMC and  strength is required to improve pt's independence during ADLs and IADLs.    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 Pt enjoys singing for leisure however has not been able to do so since experiencing CVA.    Occupational performance deficits (Please refer to evaluation for details):  ADL's;IADL's;Leisure    Rehab Potential  Good    Current Impairments/barriers affecting progress:  Positive: age, motivate, family suppory; Negative:     OT Frequency  2x / week    OT Duration  12 weeks    OT Treatment/Interventions  Self-care/ADL training;Therapeutic exercise;Neuromuscular education;Therapeutic activities;Manual Therapy;Passive range of motion    Clinical Decision Making  Several treatment options, min-mod task modification necessary    Consulted and Agree with Plan of Care  Patient       Patient will benefit from skilled therapeutic intervention in order to improve the following deficits and impairments:  Pain, Decreased cognition, Decreased range of motion, Decreased strength, Impaired perceived functional ability, Impaired UE functional use  Visit Diagnosis: Muscle weakness (generalized)  Other lack of coordination    Problem List Patient Active Problem List   Diagnosis Date Noted  . Adjustment disorder with depressed mood 04/01/2018  . Essential hypertension 02/23/2018  . Migraine 02/23/2018  . HIV disease ([place])   . Stroke (cerebrum) ([place]) - R basal ganglia d/t small vessel dz 02/18/2018  . HLD (hyperlipidemia) 08/02/2014  . Type 2 diabetes mellitus ([place]) 07/31/2014  . Ulcerative colitis ([place]) 06/26/2008    Oliver Hum, OTS 05/06/2018, 1:26 PM   This entire session was performed under direct supervision and direction of a licensed therapist/therapist assistant . I have personally read, edited and approve of the note as written.  Harrel Carina, MS, OTR/L   [place] MAIN [facility] SERVICES [address] [place], [place], [address] Phone: [phone]   Fax:  [phone]  Name: [name] MRN: [id number] Date of Birth: [birth year]

## 2018-05-07 ENCOUNTER — Encounter: Payer: Self-pay | Admitting: Family Medicine

## 2018-05-07 ENCOUNTER — Ambulatory Visit (INDEPENDENT_AMBULATORY_CARE_PROVIDER_SITE_OTHER): Payer: BLUE CROSS/BLUE SHIELD | Admitting: Family Medicine

## 2018-05-07 ENCOUNTER — Telehealth: Payer: Self-pay

## 2018-05-07 VITALS — BP 120/74 | HR 70 | Temp 97.9°F | Ht 62.0 in | Wt 137.8 lb

## 2018-05-07 DIAGNOSIS — F4321 Adjustment disorder with depressed mood: Secondary | ICD-10-CM

## 2018-05-07 DIAGNOSIS — E162 Hypoglycemia, unspecified: Secondary | ICD-10-CM

## 2018-05-07 DIAGNOSIS — IMO0002 Reserved for concepts with insufficient information to code with codable children: Secondary | ICD-10-CM

## 2018-05-07 DIAGNOSIS — E118 Type 2 diabetes mellitus with unspecified complications: Secondary | ICD-10-CM | POA: Diagnosis not present

## 2018-05-07 DIAGNOSIS — K51919 Ulcerative colitis, unspecified with unspecified complications: Secondary | ICD-10-CM | POA: Diagnosis not present

## 2018-05-07 DIAGNOSIS — M542 Cervicalgia: Secondary | ICD-10-CM

## 2018-05-07 DIAGNOSIS — E1165 Type 2 diabetes mellitus with hyperglycemia: Secondary | ICD-10-CM

## 2018-05-07 DIAGNOSIS — I63511 Cerebral infarction due to unspecified occlusion or stenosis of right middle cerebral artery: Secondary | ICD-10-CM

## 2018-05-07 DIAGNOSIS — R202 Paresthesia of skin: Secondary | ICD-10-CM

## 2018-05-07 MED ORDER — METHOCARBAMOL 500 MG PO TABS: 500.0000 mg | ORAL_TABLET | Freq: Two times a day (BID) | ORAL | 0 refills | Status: DC | PRN

## 2018-05-07 MED ORDER — GLUCOSE 4 G PO CHEW: 1.0000 | CHEWABLE_TABLET | ORAL | 3 refills | Status: AC | PRN

## 2018-05-07 NOTE — Telephone Encounter (Signed)
Seen today. 

## 2018-05-07 NOTE — Telephone Encounter (Signed)
Pt spoke with [place] last night; pt had tingling in hands and lightheadedness during therapy on 05/06/18. Last night did not have tingling in hands. BS last night was 80 which was normal for pt. I spoke with pt this morning and tingling in hands has started again; lightheaded on and off. No weakness in arms or legs and pt is speaking clearly.Pt has not cked BS this morning. Pt has appt with Dr Darnell Level 05/07/18 at 9:15. [facility] note in Dr Synthia Innocent in box.

## 2018-05-07 NOTE — Telephone Encounter (Signed)
I spoke to pt and she was seen in office today by Dr Darnell Level

## 2018-05-07 NOTE — Telephone Encounter (Signed)
I dont think she is having another stroke. I have referred her to ortho for further evaluation.

## 2018-05-07 NOTE — Patient Instructions (Addendum)
I think healthy diet has significantly helped sugar control. Stop glipizide.  If sugars start rising, then may restart 1/2 tablet glipizide - always take with meals. Keep [place] updated with how your sugars are running - call me in 1 week with readings.  Ok to try wrist brace - but see our referral coordinators today to schedule ortho appointment.  Change celexa to the morning.  For neck pain - try muscle relaxant sent to pharmacy.

## 2018-05-07 NOTE — Progress Notes (Addendum)
BP 120/74 (BP Location: Left Arm, Patient Position: Sitting, Cuff Size: Normal)   Pulse 70   Temp 97.9 F (36.6 C) (Oral)   Ht 5' 2"  (1.575 m)   Wt 137 lb 12 oz (62.5 kg)   SpO2 99%   BMI 25.19 kg/m    CC: tingling Subjective:    Patient ID: [name], female    DOB: 1956-01-27, 62 y.o.   MRN: [id number]  HPI: [name] is a 62 y.o. female presenting on 05/07/2018 for Numbness (C/o tingling in in bilateral hands, worse in right. Started yesterday. Had x-rays at our office 05/03/18.); Dizziness (C/o lightheadness that started yesterday. Concerned about having another stroke. Also, thinks it could be BS related. H/o vertigo. ); and Neck Pain (C/o neck pain that started about 2 days ago. )   Recent embolic R basal ganglia CVA suffered 02/2018. Presented with L facial droop, slurred speech, and L hemiparesis. Treated with tPA. Found to have HIV, started on biktarvy (family unaware). Given DAPT x3 wks then transitioned to aspirin alone. Residual L hemiparesis and L neuropathic stroke pain. Recent notes reviewed (PCP, neurology, ID, and rehab). On topamax 30m nightly for migraine prevention.   Presents today with 1d h/o entire R > L hand tingling associated with lightheadedness.   She has h/o vertigo.  DM on glipizide 193mbid. Sugars recently dropping regularly to 30-40s. Trouble tolerating metformin due to ulcerative colitis history.  Lab Results  Component Value Date   HGBA1C 9.1 (H) 02/19/2018    Some insomnia since she started celexa at night.  Relevant past medical, surgical, family and social history reviewed and updated as indicated. Interim medical history since our last visit reviewed. Allergies and medications reviewed and updated. Outpatient Medications Prior to Visit  Medication Sig Dispense Refill  . acetaminophen (TYLENOL) 325 MG tablet Take 2 tablets (650 mg total) by mouth every 6 (six) hours as needed for mild pain (or temp > 37.5 C (99.5 F)).    . [place] Kitchenalbuterol (PROVENTIL HFA;VENTOLIN HFA) 108 (90 Base) MCG/ACT inhaler Inhale 2 puffs into the lungs every 6 (six) hours as needed for wheezing or shortness of breath. 1 Inhaler 0  . aspirin EC 81 MG EC tablet Take 1 tablet (81 mg total) by mouth daily.    . [place] Kitchentorvastatin (LIPITOR) 40 MG tablet TAKE 1 TABLET (40 MG TOTAL) BY MOUTH DAILY AT 6 PM. 30 tablet 5  . bictegravir-emtricitabine-tenofovir AF (BIKTARVY) 50-200-25 MG TABS tablet Take 1 tablet by mouth daily. 30 tablet 5  . citalopram (CELEXA) 10 MG tablet Take 1 tablet (10 mg total) by mouth at bedtime. 30 tablet 2  . diclofenac sodium (VOLTAREN) 1 % GEL Apply 1 application topically 3 (three) times daily. Left knee 3 Tube 4  . glipiZIDE (GLUCOTROL) 10 MG tablet Take 1 tablet (10 mg total) by mouth 2 (two) times daily before a meal. 60 tablet 3  . glucose blood (FREESTYLE LITE) test strip 1 each by Other route 2 (two) times daily. 100 each 5  . potassium chloride SA (K-DUR,KLOR-CON) 20 MEQ tablet Take 1 tablet (20 mEq total) by mouth daily. 30 tablet 5  . topiramate (TOPAMAX) 50 MG tablet Take 1 tablet (50 mg total) by mouth at bedtime. 30 tablet 2   No facility-administered medications prior to visit.      Per HPI unless specifically indicated in ROS section below Review of Systems     Objective:    BP 120/74 (BP Location: Left Arm, Patient  Position: Sitting, Cuff Size: Normal)   Pulse 70   Temp 97.9 F (36.6 C) (Oral)   Ht 5' 2"  (1.575 m)   Wt 137 lb 12 oz (62.5 kg)   SpO2 99%   BMI 25.19 kg/m   Wt Readings from Last 3 Encounters:  05/07/18 137 lb 12 oz (62.5 kg)  05/03/18 140 lb (63.5 kg)  04/06/18 139 lb 12.8 oz (63.4 kg)    Physical Exam  Constitutional: She appears well-developed and well-nourished. No distress.  HENT:  Head: Normocephalic and atraumatic.  Mouth/Throat: Oropharynx is clear and moist. No oropharyngeal exudate.  Neck: Normal range of motion. Neck supple. Carotid bruit is not present. No thyromegaly  present.  Cardiovascular: Normal rate, regular rhythm and normal heart sounds.  No murmur heard. Pulmonary/Chest: Effort normal and breath sounds normal. No respiratory distress. She has no wheezes. She has no rales.  Musculoskeletal: She exhibits no edema.  FROM cervical neck, with discomfort on lateral flexion and lateral rotation to right Tender to palpation midline cervical spine as well as R>L paracervical mm and R trap mm  Neurological: She is alert. A cranial nerve deficit is present. No sensory deficit.  CN 2-12 intact except for noted L facial weakness EOMI Persistent L hemiparesis Slowed FTN on left Diminished strength LUE and grip strength Sensation intact to light touch bilateral hands  Skin: Capillary refill takes less than 2 seconds. No rash noted.  Psychiatric: She has a normal mood and affect.  Nursing note and vitals reviewed.  Results for orders placed or performed in visit on 04/26/18  Potassium  Result Value Ref Range   Potassium 3.8 3.5 - 5.1 mEq/L      Assessment & Plan:  She has f/u with PCP scheduled in 1 month.  I did ask her to call me in 1 wk with sugar log.  I asked her to see our referral coordinators to schedule ortho eval of L wrist pain. Problem List Items Addressed This Visit    Ulcerative colitis ([place])    Carries this diagnosis - aavoids metformin.       Stroke (cerebrum) (HCC) - R basal ganglia d/t small vessel dz    Recent stroke - on statin. Completed 3 wks DAPT, now on aspirin 67m daily. Never had hypertension. Will work towards diabetic control.       Paresthesia of both hands    ?DM vs HIV vs cervical radiculopathy related - monitor for now. I don't think this is herald of stroke recurrence. I am more concerned about her recent hypoglycemic episodes.  She will try wrist brace and has ortho f/u planned.       Neck pain    Anticipate cervical strain - discussed tylenol use, Rx robaxin, rec gentle stretches and heating pad. Update if  not improving.       Hypoglycemia - Primary    Drastic diet changes (low carb, low sodium) over the last few weeks after her stroke 2 months ago - but has continued prior sulfonylurea regimen with multiple episodes of low sugars and hypoglycemia noted. Discussed this, recommend she fully stop her glipizide 171mbid at this time and monitor sugar readings, if they start trending up then restart 59m459mnce daily then up to bid with meals if needed. Hypoglycemia could account for some of her symptoms.  Reviewed hypoglycemia treatment plan. I also prescribed glucose tablets to use if hypoglycemia recurs.  Pt agrees with plan.       Diabetes mellitus type  2, uncontrolled, with complications ([place])    Intolerant to metformin. Now worried sulfonylurea is causing hypoglycemia in setting of healthy diet changes. Will stop antihyperglycemic and monitor for need for sugar control. Would consider low dose metformin vs SGLT2 inhibitor for cardiovascular benefit (if affordable). Will defer to PCP.       Relevant Medications   glucose 4 GM chewable tablet   Adjustment disorder with depressed mood    Some insomnia with celexa at night - rec switch to AM dosing.           Meds ordered this encounter  Medications  . glucose 4 GM chewable tablet    Sig: Chew 1 tablet (4 g total) by mouth as needed for low blood sugar.    Dispense:  30 tablet    Refill:  3  . methocarbamol (ROBAXIN) 500 MG tablet    Sig: Take 1 tablet (500 mg total) by mouth 2 (two) times daily as needed for muscle spasms (sedation precaution).    Dispense:  30 tablet    Refill:  0   No orders of the defined types were placed in this encounter.   Follow up plan: No follow-ups on file.  Ria Bush, MD

## 2018-05-08 DIAGNOSIS — M542 Cervicalgia: Secondary | ICD-10-CM | POA: Insufficient documentation

## 2018-05-08 DIAGNOSIS — R202 Paresthesia of skin: Secondary | ICD-10-CM | POA: Insufficient documentation

## 2018-05-08 DIAGNOSIS — E162 Hypoglycemia, unspecified: Secondary | ICD-10-CM | POA: Insufficient documentation

## 2018-05-08 NOTE — Assessment & Plan Note (Addendum)
Intolerant to metformin. Now worried sulfonylurea is causing hypoglycemia in setting of healthy diet changes. Will stop antihyperglycemic and monitor for need for sugar control. Would consider low dose metformin vs SGLT2 inhibitor for cardiovascular benefit (if affordable). Will defer to PCP.

## 2018-05-08 NOTE — Assessment & Plan Note (Addendum)
Drastic diet changes (low carb, low sodium) over the last few weeks after her stroke 2 months ago - but has continued prior sulfonylurea regimen with multiple episodes of low sugars and hypoglycemia noted. Discussed this, recommend she fully stop her glipizide 55m bid at this time and monitor sugar readings, if they start trending up then restart 512monce daily then up to bid with meals if needed. Hypoglycemia could account for some of her symptoms.  Reviewed hypoglycemia treatment plan. I also prescribed glucose tablets to use if hypoglycemia recurs.  Pt agrees with plan.

## 2018-05-08 NOTE — Assessment & Plan Note (Addendum)
Carries this diagnosis - aavoids metformin.

## 2018-05-08 NOTE — Assessment & Plan Note (Addendum)
?  DM vs HIV vs cervical radiculopathy related - monitor for now. I don't think this is herald of stroke recurrence. I am more concerned about her recent hypoglycemic episodes.  She will try wrist brace and has ortho f/u planned.

## 2018-05-08 NOTE — Assessment & Plan Note (Signed)
Some insomnia with celexa at night - rec switch to AM dosing.

## 2018-05-08 NOTE — Assessment & Plan Note (Addendum)
Recent stroke - on statin. Completed 3 wks DAPT, now on aspirin 29m daily. Never had hypertension. Will work towards diabetic control.

## 2018-05-08 NOTE — Assessment & Plan Note (Signed)
Anticipate cervical strain - discussed tylenol use, Rx robaxin, rec gentle stretches and heating pad. Update if not improving.

## 2018-05-11 ENCOUNTER — Ambulatory Visit: Payer: BLUE CROSS/BLUE SHIELD | Admitting: Occupational Therapy

## 2018-05-11 ENCOUNTER — Encounter: Payer: Self-pay | Admitting: Occupational Therapy

## 2018-05-11 DIAGNOSIS — R278 Other lack of coordination: Secondary | ICD-10-CM | POA: Diagnosis not present

## 2018-05-11 DIAGNOSIS — M6281 Muscle weakness (generalized): Secondary | ICD-10-CM | POA: Diagnosis not present

## 2018-05-11 NOTE — Therapy (Addendum)
[place] MAIN [facility] SERVICES [address] [place], [place], [address] Phone: [phone]   Fax:  [phone]  Occupational Therapy Treatment  Patient Details  Name: [name] MRN: [id number] Date of Birth: Mar 04, 1956 No data recorded  Encounter Date: 05/11/2018  OT End of Session - 05/11/18 1616    Visit Number  8    Number of Visits  24    Date for OT Re-Evaluation  06/24/18    Authorization Type  visit 8 of 10 for progress report period starting 04/01/2018    OT Start Time  1300    OT Stop Time  1345    OT Time Calculation (min)  45 min    Activity Tolerance  Patient tolerated treatment well    Behavior During Therapy  [facility] for tasks assessed/performed       Past Medical History:  Diagnosis Date  . Asthma   . Diabetes mellitus without complication ([place]) 0-25-85   hx. NIDDM-dx. 3 weeks ago  . Hyperlipidemia    hx  . Seasonal allergies   . Stroke ([place])   . Ulcerative colitis [facility])     Past Surgical History:  Procedure Laterality Date  . BUNIONECTOMY     bilateral and toe nails of big toes removed  . CESAREAN SECTION  1997/2006  . CHOLECYSTECTOMY N/A 12/01/2012   Procedure: LAPAROSCOPIC CHOLECYSTECTOMY;  Surgeon: Stark Klein, MD;  Location: [facility] ORS;  Service: General;  Laterality: N/A;  . COLONOSCOPY W/ POLYPECTOMY    . EYE SURGERY Bilateral 2013  . TONSILLECTOMY      There were no vitals filed for this visit.  Subjective Assessment - 05/11/18 1613    Subjective   Pt reports seeing her physician for an ortho referral and to discuss the increased tingling in her hands.    Pertinent History  Pt is a 62 y.o. female presenting with LUE weakness and decreased [place] as a result of R basal ganglia CVA on 8/08. Pt was hospitalized for 1 week where she received inpatient therapy and was then discharged home where she received home health therapy for 2 weeks. Prior to CVA, pt was independent for ADLs and IADLs. Currently, pt  requires increased assistance, increased time, and/or modified strategies for ADLs and IADLs.     Patient Stated Goals  Pt wants to be able to do everthing with her L hand that her R hand can do, including making wigs, picking up laundry baskets, manipulating coins for shopping, and grooming hair.    Currently in Pain?  Yes    Pain Score  5     Pain Location  Hand    Pain Orientation  Right;Left    Pain Descriptors / Indicators  Aching;Tender    Pain Type  Acute pain    Pain Onset  1 to 4 weeks ago    Pain Frequency  Intermittent      OT TREATMENT  Therapeutic exercise:   Pt. worked on pinch strengthening in the left hand for lateral, and 3pt. pinch using yellow and red resistive clips. Pt. worked on placing the clips at a horizontal angle. Tactile and verbal cues were required for eliciting the desired movement. Pt demonstrated increased hand fatigue as the task progressed and required x2 short rest breaks. Pt demonstrated decreased strength as she reach to place clips further from midline.  Neuromuscular re-education:   Pt completed [facility] task that required her to manipulate [place] style pegs. Pt required verbal and tactile cuing to  use thumb opposed to 3rd digit to pick up pegs and turn over using 2nd digit. Pt was unable to complete this movement today. Pt dropped x3 pegs during the task. Pt. worked on grasping coins of various sizes from a tabletop surface and placing them into stacks of x10. Pt required increased time to complete the task. Pt demonstrated increased coordination as the task progressed.                     OT Education - 05/11/18 1615    Education Details  L hand strengthening, [place] skills    Person(s) Educated  Patient    Methods  Explanation;Verbal cues    Comprehension  Verbalized understanding;Verbal cues required          OT Long Term Goals - 04/01/18 1214      OT LONG TERM GOAL #1   Title  Pt will decreased [place] by 15 secs of speed in  order to independently manipulate coins during grocery shopping    Baseline  Pt drops coins when checking out at grocery store; [facility] 3 mins 06 secs    Time  12    Period  Weeks    Status  New    Target Date  06/24/18      OT LONG TERM GOAL #2   Title  Pt will increase LUE strength by 1 mm level in order to independently pick up laundry baskets during IADLs    Baseline  Pt is unable to pick up laundry baskets; MMT LUE 3+/5    Time  12    Period  Weeks    Status  New    Target Date  06/24/18      OT LONG TERM GOAL #3   Title  Pt will increase L shoulder ROM by 10 degrees in efficiently bathe and dress UB    Baseline  Pt is unable to use L hand to bathe and dress efficiently; L AROM shoulder abd 52 degrees    Time  12    Period  Weeks    Status  New    Target Date  06/24/18      OT LONG TERM GOAL #4   Title  Pt will increase L lateral pinch by 3# in order to clip nails independently    Baseline  Pt is unable to use L hand to clip nails; L lateral pinch is 3#    Time  12    Period  Weeks    Status  New    Target Date  06/24/18      OT LONG TERM GOAL #5   Title  Pt will increase L 3 point pinch by 4# to be able to independently button small buttons    Baseline  Pt requires increased time to button clothes using L hand; L 3 point pinch is 2#    Time  12    Period  Weeks    Status  New    Target Date  06/24/18            Plan - 05/11/18 1616    Clinical Impression Statement Pt has been experiencing tingling bilaterally in her hands. Pt. Continues to be limited by pain in the left shoulder, wrist, and hand. Pt presents today with edema throughout her left digits, hand, and wrist. Pt worked on left hand [facility] skills and strengthening today. Increased [facility] skills and strengthening is required to improve pt's independence during ADLs and IADLs.  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 Pt enjoys singing for leisure however has not been able to do so since experiencing CVA.    Occupational performance deficits (Please refer to evaluation for details):  ADL's;IADL's;Leisure    Rehab Potential  Good    Current Impairments/barriers affecting progress:  Positive: age, motivate, family suppory; Negative:     OT Frequency  2x / week    OT Duration  12 weeks    OT Treatment/Interventions  Self-care/ADL training;Therapeutic exercise;Neuromuscular education;Therapeutic activities;Manual Therapy;Passive range of motion    Clinical Decision Making  Several treatment options, min-mod task modification necessary    Consulted and Agree with Plan of Care  Patient       Patient will benefit from skilled therapeutic intervention in order to improve the following deficits and impairments:  Pain, Decreased cognition, Decreased range of motion, Decreased strength, Impaired perceived functional ability, Impaired UE functional use  Visit Diagnosis: Other lack of coordination  Muscle weakness (generalized)    Problem List Patient Active Problem List   Diagnosis Date Noted  . Hypoglycemia 05/08/2018  . Neck pain 05/08/2018  . Paresthesia of both hands 05/08/2018  . Adjustment disorder with depressed mood 04/01/2018  . Migraine 02/23/2018  . HIV disease ([place])   . Stroke (cerebrum) ([place]) - R basal ganglia d/t small vessel dz 02/18/2018  . HLD (hyperlipidemia) 08/02/2014  . Diabetes mellitus type 2, uncontrolled, with complications ([place]) 76/19/5093  . Ulcerative colitis ([place]) 06/26/2008    Oliver Hum, OTS 05/11/2018, 4:23 PM   This entire session was performed under direct supervision and direction of a licensed therapist/therapist assistant . I have personally read, edited and approve of the note as written.  Harrel Carina, MS, OTR/L   [place] MAIN [facility] SERVICES [address]  [place], [place], [address] Phone: [phone]   Fax:  [phone]  Name: [name] MRN: [id number] Date of Birth: March 07, 1956

## 2018-05-12 DIAGNOSIS — M25512 Pain in left shoulder: Secondary | ICD-10-CM | POA: Diagnosis not present

## 2018-05-12 DIAGNOSIS — S63511A Sprain of carpal joint of right wrist, initial encounter: Secondary | ICD-10-CM | POA: Diagnosis not present

## 2018-05-13 ENCOUNTER — Encounter: Payer: Self-pay | Admitting: Occupational Therapy

## 2018-05-13 ENCOUNTER — Ambulatory Visit: Payer: BLUE CROSS/BLUE SHIELD | Admitting: Occupational Therapy

## 2018-05-13 DIAGNOSIS — M792 Neuralgia and neuritis, unspecified: Secondary | ICD-10-CM | POA: Diagnosis not present

## 2018-05-13 DIAGNOSIS — R278 Other lack of coordination: Secondary | ICD-10-CM

## 2018-05-13 DIAGNOSIS — M6281 Muscle weakness (generalized): Secondary | ICD-10-CM | POA: Diagnosis not present

## 2018-05-13 DIAGNOSIS — M25532 Pain in left wrist: Secondary | ICD-10-CM | POA: Diagnosis not present

## 2018-05-13 NOTE — Therapy (Addendum)
[place] MAIN [facility] SERVICES [address] [place], [place], [address] Phone: [phone]   Fax:  [phone]  Occupational Therapy Treatment  Patient Details  Name: [name] MRN: [id number] Date of Birth: [birth year] No data recorded  Encounter Date: 05/13/2018  OT End of Session - 05/13/18 1629    Visit Number  9    Number of Visits  24    Date for OT Re-Evaluation  06/24/18    Authorization Type  visit 9 of 10 for progress report period starting 04/01/2018    OT Start Time  1612    OT Stop Time  1653    OT Time Calculation (min)  41 min    Activity Tolerance  Patient tolerated treatment well    Behavior During Therapy  [facility] for tasks assessed/performed       Past Medical History:  Diagnosis Date  . Asthma   . Diabetes mellitus without complication ([place]) 3-54-65   hx. NIDDM-dx. 3 weeks ago  . Hyperlipidemia    hx  . Seasonal allergies   . Stroke ([place])   . Ulcerative colitis [facility])     Past Surgical History:  Procedure Laterality Date  . BUNIONECTOMY     bilateral and toe nails of big toes removed  . CESAREAN SECTION  1997/2006  . CHOLECYSTECTOMY N/A 12/01/2012   Procedure: LAPAROSCOPIC CHOLECYSTECTOMY;  Surgeon: Stark Klein, MD;  Location: [facility];  Service: General;  Laterality: N/A;  . COLONOSCOPY W/ POLYPECTOMY    . EYE SURGERY Bilateral 2013  . TONSILLECTOMY      There were no vitals filed for this visit.  Subjective Assessment - 05/13/18 1618    Subjective   Pt reports seeing an orthopedist that believes she may have nerve damage in her left arm. Pt recieved a cortisone shot during the visit.    Pertinent History  Pt is a 62 y.o. female presenting with LUE weakness and decreased [place] as a result of R basal ganglia CVA on 8/08. Pt was hospitalized for 1 week where she received inpatient therapy and was then discharged home where she received home health therapy for 2 weeks. Prior to CVA, pt was independent for ADLs  and IADLs. Currently, pt requires increased assistance, increased time, and/or modified strategies for ADLs and IADLs.     Patient Stated Goals  Pt wants to be able to do everthing with her L hand that her R hand can do, including making wigs, picking up laundry baskets, manipulating coins for shopping, and grooming hair.    Currently in Pain?  Yes    Pain Score  5     Pain Location  Arm    Pain Orientation  Left    Pain Descriptors / Indicators  Aching;Tender    Pain Type  Acute pain    Pain Onset  1 to 4 weeks ago    Pain Frequency  Intermittent    Multiple Pain Sites  No      OT TREATMENT  Neuromuscular re-education:  Pt. worked on grasping and positioning magnetic hooks on a whiteboard positioned at a vertical angle on the tabletop. Pt. worked on [facility] skills grasping 1/2" flat washers and placing & removing them on the hooks. Pt had increased difficulty removing washers from magnetic dish. Pt. then used heavy duty magnets on whiteboard. Pt was unable to remove magnets with her left hand and required increased time to place them onto board. Pt was able to place and remove small  peg magnets with increased time. Pt reported pain when reaching higher on the board. Pt was instructed to use the lower half of the board to complete task.                   OT Education - 05/13/18 1624    Education Details  left hand [facility]    Person(s) Educated  Patient    Methods  Explanation;Verbal cues    Comprehension  Verbalized understanding;Verbal cues required          OT Long Term Goals - 04/01/18 1214      OT LONG TERM GOAL #1   Title  Pt will decreased [place] by 15 secs of speed in order to independently manipulate coins during grocery shopping    Baseline  Pt drops coins when checking out at grocery store; [facility] 3 mins 06 secs    Time  12    Period  Weeks    Status  New    Target Date  06/24/18      OT LONG TERM GOAL #2   Title  Pt will increase LUE strength by 1 mm level in  order to independently pick up laundry baskets during IADLs    Baseline  Pt is unable to pick up laundry baskets; MMT LUE 3+/5    Time  12    Period  Weeks    Status  New    Target Date  06/24/18      OT LONG TERM GOAL #3   Title  Pt will increase L shoulder ROM by 10 degrees in efficiently bathe and dress UB    Baseline  Pt is unable to use L hand to bathe and dress efficiently; L AROM shoulder abd 52 degrees    Time  12    Period  Weeks    Status  New    Target Date  06/24/18      OT LONG TERM GOAL #4   Title  Pt will increase L lateral pinch by 3# in order to clip nails independently    Baseline  Pt is unable to use L hand to clip nails; L lateral pinch is 3#    Time  12    Period  Weeks    Status  New    Target Date  06/24/18      OT LONG TERM GOAL #5   Title  Pt will increase L 3 point pinch by 4# to be able to independently button small buttons    Baseline  Pt requires increased time to button clothes using L hand; L 3 point pinch is 2#    Time  12    Period  Weeks    Status  New    Target Date  06/24/18            Plan - 05/13/18 1631    Clinical Impression Statement  Pt, reports have had x-rays, and seeing the orthopedic physician for her shoulder, and another for her left wrist. Imaging of her shoulder revealed mild spurring of the acromioclavicular joint,, and slight widening of the scapholunate space consistent with disruption of the scapholunate ligament, and well as arthritic changes of the radiocarpal joint at the 1st [place]. Pt. reports having a cortizone shot in her left shoulder. Pt presents today wearing a pre-fabricated wrist brace. Pt continues to work to improve left hand [facility] skills to increase independence during ADLs and IADLs.    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 Pt enjoys singing for leisure however has not  been able to do so since experiencing CVA.    Occupational performance deficits (Please refer to evaluation for details):  ADL's;IADL's;Leisure    Rehab Potential  Good    Current Impairments/barriers affecting progress:  Positive: age, motivate, family suppory; Negative:     OT Frequency  2x / week    OT Duration  12 weeks    OT Treatment/Interventions  Self-care/ADL training;Therapeutic exercise;Neuromuscular education;Therapeutic activities;Manual Therapy;Passive range of motion    Clinical Decision Making  Several treatment options, min-mod task modification necessary    Consulted and Agree with Plan of Care  Patient       Patient will benefit from skilled therapeutic intervention in order to improve the following deficits and impairments:  Pain, Decreased cognition, Decreased range of motion, Decreased strength, Impaired perceived functional ability, Impaired UE functional use  Visit Diagnosis: Other lack of coordination    Problem List Patient Active Problem List   Diagnosis Date Noted  . Hypoglycemia 05/08/2018  . Neck pain 05/08/2018  . Paresthesia of both hands 05/08/2018  . Adjustment disorder with depressed mood 04/01/2018  . Migraine 02/23/2018  . HIV disease ([place])   . Stroke (cerebrum) ([place]) - R basal ganglia d/t small vessel dz 02/18/2018  . HLD (hyperlipidemia) 08/02/2014  . Diabetes mellitus type 2, uncontrolled, with complications ([place]) 88/41/6606  . Ulcerative colitis ([place]) 06/26/2008   Oliver Hum, OTS  This entire session was performed under direct supervision and direction of a licensed therapist/therapist assistant . I have personally read, edited and approve of the note as written.  Harrel Carina, MS, OTR/L 05/13/2018, 5:30 PM  [place] MAIN [facility] SERVICES [address] [place], [place], [address] Phone: [phone]   Fax:  [phone]  Name: [name] MRN: [id number] Date of Birth: 07-04-1956

## 2018-05-17 ENCOUNTER — Encounter: Payer: Self-pay | Admitting: Occupational Therapy

## 2018-05-18 ENCOUNTER — Encounter: Payer: Self-pay | Admitting: Occupational Therapy

## 2018-05-18 ENCOUNTER — Ambulatory Visit: Payer: BLUE CROSS/BLUE SHIELD | Attending: Physical Medicine & Rehabilitation | Admitting: Occupational Therapy

## 2018-05-18 DIAGNOSIS — R278 Other lack of coordination: Secondary | ICD-10-CM | POA: Insufficient documentation

## 2018-05-18 DIAGNOSIS — M6281 Muscle weakness (generalized): Secondary | ICD-10-CM | POA: Insufficient documentation

## 2018-05-18 NOTE — Therapy (Addendum)
[place] MAIN [facility] SERVICES [address] [place], [place], [address] Phone: [phone]   Fax:  [phone]  Occupational Therapy Treatment/10th Visit Note  Patient Details  Name: [name] MRN: [id number] Date of Birth: Oct 12, 1955 No data recorded  Encounter Date: 05/18/2018  OT End of Session - 05/18/18 1649    Visit Number  10    Number of Visits  24    Date for OT Re-Evaluation  06/24/18    Authorization Type  visit 10 of 10 for progress report period starting 04/01/2018    OT Start Time  1602    OT Stop Time  1647    OT Time Calculation (min)  45 min    Activity Tolerance  Patient tolerated treatment well    Behavior During Therapy  [facility] for tasks assessed/performed       Past Medical History:  Diagnosis Date  . Asthma   . Diabetes mellitus without complication ([place]) 4-46-28   hx. NIDDM-dx. 3 weeks ago  . Hyperlipidemia    hx  . Seasonal allergies   . Stroke ([place])   . Ulcerative colitis [facility])     Past Surgical History:  Procedure Laterality Date  . BUNIONECTOMY     bilateral and toe nails of big toes removed  . CESAREAN SECTION  1997/2006  . CHOLECYSTECTOMY N/A 12/01/2012   Procedure: LAPAROSCOPIC CHOLECYSTECTOMY;  Surgeon: Stark Klein, MD;  Location: [facility];  Service: General;  Laterality: N/A;  . COLONOSCOPY W/ POLYPECTOMY    . EYE SURGERY Bilateral 2013  . TONSILLECTOMY      There were no vitals filed for this visit.  Subjective Assessment - 05/18/18 1612    Subjective   Pt reports having a good day today and that she has not experienced any greater pain since her last appointment.    Pertinent History  Pt is a 62 y.o. female presenting with LUE weakness and decreased [place] as a result of R basal ganglia CVA on 8/08. Pt was hospitalized for 1 week where she received inpatient therapy and was then discharged home where she received home health therapy for 2 weeks. Prior to CVA, pt was independent for ADLs and  IADLs. Currently, pt requires increased assistance, increased time, and/or modified strategies for ADLs and IADLs.     Patient Stated Goals  Pt wants to be able to do everthing with her L hand that her R hand can do, including making wigs, picking up laundry baskets, manipulating coins for shopping, and grooming hair.    Currently in Pain?  Yes    Pain Score  3     Pain Location  Arm    Pain Orientation  Left    Pain Descriptors / Indicators  Aching;Tender    Pain Type  Acute pain    Pain Onset  More than a month ago    Pain Frequency  Intermittent    Aggravating Factors   Moving L arm    Multiple Pain Sites  No         [facility] OT Assessment - 05/18/18 1656      Coordination   Left 9 Hole Peg Test  1 min 26 secs      Hand Function   Left Hand Grip (lbs)  1#    Left Hand Lateral Pinch  3 lbs    Left 3 point pinch  1 lbs    Comment  Left hand 2 point pinch 1#  OT TREATMENT  Goals were reviewed, measurements for grip/pinch strength and FMC were obtained.  Therapeutic exercise:   Pt tolerated PROM of left shoulder while lying supine on mat. Pt reported pain of 10/10 today with any active movement of the left shoulder. Pt reported pain of 3/10 in her left UE while at rest and pain of 0/10 during PROM. Pt required max verbal cuing to relax LUE during PROM as she would experience increased pain if voluntarily completing any movement. Pt presents with increased tension and guarded movement patterns due to recent pain history.                 OT Education - 05/18/18 1648    Education Details  POC, goals, LUE ROM    Person(s) Educated  Patient    Methods  Explanation;Verbal cues    Comprehension  Verbalized understanding;Verbal cues required          OT Long Term Goals - 05/18/18 1655      OT LONG TERM GOAL #1   Title  Pt will decreased [place] by 15 secs of speed in order to independently manipulate coins during grocery shopping    Baseline  Pt drops coins when  checking out at grocery store; [facility] 3 mins 06 secs    Time  12    Period  Weeks    Status  On-going    Target Date  06/24/18      OT LONG TERM GOAL #2   Title  Pt will increase LUE strength by 1 mm level in order to independently pick up laundry baskets during IADLs    Baseline  Pt is unable to pick up laundry baskets; MMT LUE 3+/5    Time  12    Period  Weeks    Status  On-going    Target Date  06/24/18      OT LONG TERM GOAL #3   Title  Pt will increase L shoulder ROM by 10 degrees in efficiently bathe and dress UB    Baseline  Pt is unable to use L hand to bathe and dress efficiently; L AROM shoulder abd 52 degrees    Time  12    Period  Weeks    Status  On-going    Target Date  06/24/18      OT LONG TERM GOAL #4   Title  Pt will increase L lateral pinch by 3# in order to clip nails independently    Baseline  Pt is unable to use L hand to clip nails; L lateral pinch is 3#    Time  12    Period  Weeks    Status  On-going    Target Date  06/24/18      OT LONG TERM GOAL #5   Title  Pt will increase L 3 point pinch by 4# to be able to independently button small buttons    Baseline  Pt requires increased time to button clothes using L hand; L 3 point pinch is 2#    Time  12    Period  Weeks    Status  On-going    Target Date  06/24/18            Plan - 05/18/18 1650    Clinical Impression Statement  Goals were reviewed and measurements for pinch/grip strength and [facility] skills were obtained. Pt reports experiencing increased LUE pain with active movement. Pt presents with tense and gaurded movements of the LUE. Pt  completed LUE PROM to further stiffness of the arm and shoulder. Pt has developed compensatory strategies for daily tasks such as washing the dishes while experiencing such pain. Pt to continue to work on strengthening to increase function and independence during ADLs and IADLs.     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 Pt enjoys singing for leisure however has not been able to do so since experiencing CVA.    Occupational performance deficits (Please refer to evaluation for details):  ADL's;IADL's;Leisure    Rehab Potential  Good    Current Impairments/barriers affecting progress:  Positive: age, motivate, family suppory; Negative:     OT Frequency  2x / week    OT Duration  12 weeks    OT Treatment/Interventions  Self-care/ADL training;Therapeutic exercise;Neuromuscular education;Therapeutic activities;Manual Therapy;Passive range of motion    Clinical Decision Making  Several treatment options, min-mod task modification necessary    Consulted and Agree with Plan of Care  Patient       Patient will benefit from skilled therapeutic intervention in order to improve the following deficits and impairments:  Pain, Decreased cognition, Decreased range of motion, Decreased strength, Impaired perceived functional ability, Impaired UE functional use  Visit Diagnosis: Muscle weakness (generalized)    Problem List Patient Active Problem List   Diagnosis Date Noted  . Hypoglycemia 05/08/2018  . Neck pain 05/08/2018  . Paresthesia of both hands 05/08/2018  . Adjustment disorder with depressed mood 04/01/2018  . Migraine 02/23/2018  . HIV disease ([place])   . Stroke (cerebrum) ([place]) - R basal ganglia d/t small vessel dz 02/18/2018  . HLD (hyperlipidemia) 08/02/2014  . Diabetes mellitus type 2, uncontrolled, with complications ([place]) 18/86/7737  . Ulcerative colitis ([place]) 06/26/2008    Oliver Hum, OTS 05/18/2018, 4:58 PM   This entire session was performed under direct supervision and direction of a licensed therapist/therapist assistant . I have personally read, edited and approve of the note as written.  Harrel Carina, MS, OTR/L   [place] MAIN [facility] SERVICES [address] [place], [place], [address] Phone: [phone]   Fax:  [phone]  Name: [name] MRN: [id number] Date of Birth: [birth year]

## 2018-05-19 ENCOUNTER — Telehealth: Payer: Self-pay | Admitting: Internal Medicine

## 2018-05-19 NOTE — Telephone Encounter (Signed)
Pt's medications has been denied by her insurance and need prior auth to [facility]  Glipizide 10 mg Potassium Chloride SA 20 MEQ

## 2018-05-20 ENCOUNTER — Encounter: Payer: Self-pay | Admitting: Occupational Therapy

## 2018-05-20 ENCOUNTER — Ambulatory Visit: Payer: BLUE CROSS/BLUE SHIELD | Admitting: Occupational Therapy

## 2018-05-20 DIAGNOSIS — M6281 Muscle weakness (generalized): Secondary | ICD-10-CM | POA: Diagnosis not present

## 2018-05-20 DIAGNOSIS — R278 Other lack of coordination: Secondary | ICD-10-CM

## 2018-05-20 NOTE — Therapy (Addendum)
[place] MAIN [facility] SERVICES [address] [place], [place], [address] Phone: [phone]   Fax:  [phone]  Occupational Therapy Treatment  Patient Details  Name: [name] MRN: [id number] Date of Birth: Nov 24, 1955 No data recorded  Encounter Date: 05/20/2018  OT End of Session - 05/20/18 1411    Visit Number  11    Number of Visits  24    Date for OT Re-Evaluation  06/24/18    Authorization Type  visit 1 of 10 for progress report period starting 05/20/2018    OT Start Time  1402    OT Stop Time  1445    OT Time Calculation (min)  43 min    Activity Tolerance  Patient tolerated treatment well    Behavior During Therapy  [facility] for tasks assessed/performed       Past Medical History:  Diagnosis Date  . Asthma   . Diabetes mellitus without complication ([place]) 0-08-67   hx. NIDDM-dx. 3 weeks ago  . Hyperlipidemia    hx  . Seasonal allergies   . Stroke ([place])   . Ulcerative colitis [facility])     Past Surgical History:  Procedure Laterality Date  . BUNIONECTOMY     bilateral and toe nails of big toes removed  . CESAREAN SECTION  1997/2006  . CHOLECYSTECTOMY N/A 12/01/2012   Procedure: LAPAROSCOPIC CHOLECYSTECTOMY;  Surgeon: Stark Klein, MD;  Location: [facility];  Service: General;  Laterality: N/A;  . COLONOSCOPY W/ POLYPECTOMY    . EYE SURGERY Bilateral 2013  . TONSILLECTOMY      There were no vitals filed for this visit.  Subjective Assessment - 05/20/18 1407    Subjective   Pt reports still experiencing LUE pain and planning to pick up a new Rx for pain after treatment today.    Pertinent History  Pt is a 62 y.o. female presenting with LUE weakness and decreased [place] as a result of R basal ganglia CVA on 8/08. Pt was hospitalized for 1 week where she received inpatient therapy and was then discharged home where she received home health therapy for 2 weeks. Prior to CVA, pt was independent for ADLs and IADLs. Currently, pt  requires increased assistance, increased time, and/or modified strategies for ADLs and IADLs.     Patient Stated Goals  Pt wants to be able to do everthing with her L hand that her R hand can do, including making wigs, picking up laundry baskets, manipulating coins for shopping, and grooming hair.    Currently in Pain?  Yes    Pain Score  6     Pain Location  Arm    Pain Orientation  Left    Pain Descriptors / Indicators  Aching;Stabbing    Pain Type  Acute pain    Pain Onset  More than a month ago    Pain Frequency  Intermittent    Aggravating Factors   Moving L arm    Pain Relieving Factors  Medication    Multiple Pain Sites  No      OT TREATMENT  Pt. Tolerated moist heat modality for 8 min. to the left shoulder prior to ROM.  Therapeutic exercise:   Pt tolerated PROM of the LUE while lying supine on the mat.  PROM included shoulder flexion & extension and elbow flexion & extension. Pt was not able to tolerate shoulder abduction, forearm supination/pronation secondary to pain limitations. Pt reported pain of a 10/10 >90 degrees of shoulder flexion during  ROM. Pt. reports pain with any attempts of active ROM. Pt.'s pain fluctuates. Pt. reports pain when attempting to reposition her LUE. Pt tolerated moist heat in preparation for LUE PROM.  Neuromuscular re-education:  Pt completed left hand [facility] task that required her to store x3-5 flat marbles in her palm before translating them to her fingertips to place into container. Pt required increased time and compensatory postural patterns to store marbles in her palm. Pt was unable to translate marbles, one at a time to her fingertips to place into container. Pt noted pain in her wrist when supinating to store marbles in her palm. Pt to continue to develop left hand [facility] skills in order to increase independence during ADLs and IADLs including managing fasteners, laces, and coins.             OT Education - 05/20/18 1410    Education  Details  LUE ROM, left hand [place] skills    Person(s) Educated  Patient    Methods  Explanation;Verbal cues;Demonstration    Comprehension  Verbalized understanding;Verbal cues required          OT Long Term Goals - 05/18/18 1655      OT LONG TERM GOAL #1   Title  Pt will decreased [place] by 15 secs of speed in order to independently manipulate coins during grocery shopping    Baseline  Pt drops coins when checking out at grocery store; [facility] 3 mins 06 secs    Time  12    Period  Weeks    Status  On-going    Target Date  06/24/18      OT LONG TERM GOAL #2   Title  Pt will increase LUE strength by 1 mm level in order to independently pick up laundry baskets during IADLs    Baseline  Pt is unable to pick up laundry baskets; MMT LUE 3+/5    Time  12    Period  Weeks    Status  On-going    Target Date  06/24/18      OT LONG TERM GOAL #3   Title  Pt will increase L shoulder ROM by 10 degrees in efficiently bathe and dress UB    Baseline  Pt is unable to use L hand to bathe and dress efficiently; L AROM shoulder abd 52 degrees    Time  12    Period  Weeks    Status  On-going    Target Date  06/24/18      OT LONG TERM GOAL #4   Title  Pt will increase L lateral pinch by 3# in order to clip nails independently    Baseline  Pt is unable to use L hand to clip nails; L lateral pinch is 3#    Time  12    Period  Weeks    Status  On-going    Target Date  06/24/18      OT LONG TERM GOAL #5   Title  Pt will increase L 3 point pinch by 4# to be able to independently button small buttons    Baseline  Pt requires increased time to button clothes using L hand; L 3 point pinch is 2#    Time  12    Period  Weeks    Status  On-going    Target Date  06/24/18            Plan - 05/20/18 1411    Clinical Impression Statement Pt. was  late for the session today. Pt. reports planning to begin a new pain medication, and will be picking it up after her session today. Pain in the LUE is limiting  tolerance for ROM.  Pt presented with pain of 6/10 while at rest today. Pt. Was able to tolerate PROM for shoulder flexion to 90 degrees. Pt. Presented with 10/10 pain when attempting PROM beyond 90 degrees. Pt. was unable to tolerate PROM for any shoulder abduction, forearm supination, pronation. Pt. Has 10/10 pain reports for any active shoulder movements today, and during any attempts for repositioning. Pt.'s pain fluctuates. Pt. Reports this pain is interfering with her sleep, and her ability to find a position of comfort. Pt. Education was provided about positioning. Pt. has a left wrist brace in place.    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 Pt enjoys singing for leisure however has not been able to do so since experiencing CVA.    Occupational performance deficits (Please refer to evaluation for details):  ADL's;IADL's;Leisure    Rehab Potential  Good    Current Impairments/barriers affecting progress:  Positive: age, motivate, family suppory; Negative:     OT Frequency  2x / week    OT Duration  12 weeks    OT Treatment/Interventions  Self-care/ADL training;Therapeutic exercise;Neuromuscular education;Therapeutic activities;Manual Therapy;Passive range of motion    Clinical Decision Making  Several treatment options, min-mod task modification necessary    Consulted and Agree with Plan of Care  Patient       Patient will benefit from skilled therapeutic intervention in order to improve the following deficits and impairments:  Pain, Decreased cognition, Decreased range of motion, Decreased strength, Impaired perceived functional ability, Impaired UE functional use  Visit Diagnosis: Muscle weakness (generalized)  Other lack of coordination    Problem List Patient Active Problem List   Diagnosis Date Noted  . Hypoglycemia 05/08/2018  . Neck pain  05/08/2018  . Paresthesia of both hands 05/08/2018  . Adjustment disorder with depressed mood 04/01/2018  . Migraine 02/23/2018  . HIV disease ([place])   . Stroke (cerebrum) ([place]) - R basal ganglia d/t small vessel dz 02/18/2018  . HLD (hyperlipidemia) 08/02/2014  . Diabetes mellitus type 2, uncontrolled, with complications ([place]) 01/22/1974  . Ulcerative colitis ([place]) 06/26/2008    Oliver Hum, OTS 05/20/2018, 3:41 PM   This entire session was performed under direct supervision and direction of a licensed therapist/therapist assistant . I have personally read, edited and approve of the note as written.  Harrel Carina, MS, OTR/L   [place] MAIN [facility] SERVICES [address] [place], [place], [address] Phone: [phone]   Fax:  [phone]  Name: [name] MRN: [id number] Date of Birth: 1955-12-28

## 2018-05-20 NOTE — Telephone Encounter (Signed)
Left detailed msg on VM per HIPAA These Rx were last sent to [place] but she has [facility] listed in chart. I need to verify where she is trying to get filled so that I can call the pharmacy to get sdditional information

## 2018-05-25 ENCOUNTER — Encounter: Payer: Self-pay | Admitting: Occupational Therapy

## 2018-05-25 ENCOUNTER — Ambulatory Visit: Payer: BLUE CROSS/BLUE SHIELD | Admitting: Occupational Therapy

## 2018-05-25 DIAGNOSIS — M6281 Muscle weakness (generalized): Secondary | ICD-10-CM

## 2018-05-25 DIAGNOSIS — R278 Other lack of coordination: Secondary | ICD-10-CM | POA: Diagnosis not present
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
## 2018-05-27 ENCOUNTER — Ambulatory Visit: Payer: BLUE CROSS/BLUE SHIELD | Admitting: Occupational Therapy

## 2018-05-27 ENCOUNTER — Other Ambulatory Visit: Payer: Self-pay

## 2018-05-27 DIAGNOSIS — M25512 Pain in left shoulder: Secondary | ICD-10-CM | POA: Diagnosis not present

## 2018-05-27 NOTE — Patient Outreach (Signed)
Telephone outreach to patient to obtain mRs was successfully completed. mRs= 2.

## 2018-05-31 ENCOUNTER — Other Ambulatory Visit: Payer: Self-pay | Admitting: Internal Medicine

## 2018-05-31 ENCOUNTER — Other Ambulatory Visit: Payer: Self-pay | Admitting: Physical Medicine & Rehabilitation

## 2018-05-31 DIAGNOSIS — F329 Major depressive disorder, single episode, unspecified: Secondary | ICD-10-CM

## 2018-05-31 NOTE — Telephone Encounter (Signed)
Last filled 03/29/18 w/ refill.... Please advise

## 2018-06-01 ENCOUNTER — Ambulatory Visit: Payer: BLUE CROSS/BLUE SHIELD | Admitting: Occupational Therapy

## 2018-06-01 ENCOUNTER — Encounter: Payer: Self-pay | Admitting: Infectious Diseases

## 2018-06-01 ENCOUNTER — Ambulatory Visit (INDEPENDENT_AMBULATORY_CARE_PROVIDER_SITE_OTHER): Payer: BLUE CROSS/BLUE SHIELD | Admitting: Infectious Diseases

## 2018-06-01 VITALS — BP 152/83 | HR 76 | Temp 98.6°F | Wt 133.0 lb

## 2018-06-01 DIAGNOSIS — B20 Human immunodeficiency virus [HIV] disease: Secondary | ICD-10-CM

## 2018-06-01 DIAGNOSIS — Z0000 Encounter for general adult medical examination without abnormal findings: Secondary | ICD-10-CM | POA: Diagnosis not present

## 2018-06-01 DIAGNOSIS — Z1239 Encounter for other screening for malignant neoplasm of breast: Secondary | ICD-10-CM

## 2018-06-01 DIAGNOSIS — Z23 Encounter for immunization: Secondary | ICD-10-CM

## 2018-06-01 DIAGNOSIS — E1165 Type 2 diabetes mellitus with hyperglycemia: Secondary | ICD-10-CM

## 2018-06-01 DIAGNOSIS — R002 Palpitations: Secondary | ICD-10-CM | POA: Insufficient documentation

## 2018-06-01 DIAGNOSIS — E118 Type 2 diabetes mellitus with unspecified complications: Secondary | ICD-10-CM

## 2018-06-01 DIAGNOSIS — IMO0002 Reserved for concepts with insufficient information to code with codable children: Secondary | ICD-10-CM

## 2018-06-01 NOTE — Assessment & Plan Note (Addendum)
She has normal cardiac exam today and regular controlled rhythm. Will defer EKG today. I suspect this as well as her high blood pressure and anxiety is related to the steroid injection. She does not think it is related to stress but possible. No further work up today but if continues to have high BP and these complaints may benefit from beta blocker.

## 2018-06-01 NOTE — Assessment & Plan Note (Signed)
Trouble regulating blood sugars. I think most of her weight loss is through diet modification she has made. Recommended discussion about continuous glucose monitoring machine with her managing provider. I told her that she may be able to manage through diet alone based on what she tells me.

## 2018-06-01 NOTE — Patient Instructions (Addendum)
Wonderful to see you today! You look beautiful.   I think your blood pressure, fluttering in your chest is due to the steroid shot you have had. I suspect this should start to improve over the next few weeks.   Continuous Glucose Monitor - ask your diabetes doctor about this. I think this would be helpful to help you regulate.   Continue your Biktarvy every day - you are doing very well!   We gave you your first Hepatitis A and B vaccines today. Please schedule another injection for 1 month.   Return to see Colletta [place] in 4 months for a visit and a pap smear (30 minute appointment). Will have you check labs 2 weeks before this visit if you can.

## 2018-06-01 NOTE — Progress Notes (Signed)
Patient: [name]  DOB: 11-Feb-1956 MRN: [id number] PCP: Jearld Fenton, NP   Patient Active Problem List   Diagnosis Date Noted  . Palpitations 06/01/2018  . Hypoglycemia 05/08/2018  . Neck pain 05/08/2018  . Paresthesia of both hands 05/08/2018  . Healthcare maintenance 04/01/2018  . Migraine 02/23/2018  . HIV disease ([place])   . 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 HLD (hyperlipidemia) 08/02/2014  . Diabetes mellitus type 2, uncontrolled, with complications ([place]) 40/98/1191  . Ulcerative colitis ([place]) 06/26/2008     Subjective:  Brief ID:  [name] is a 62 y.o. female with HIV infection diagnosed August 2019 on routine screening after having had a stroke. VL 118,000 copies, CD4 500. HIV Risk: heterosexual. OI Hx: none  Previous Regimens:   Biktarvy 02-2018: suppressed   Resistance Testing:   02-2018 Genosure w/o mutations   CC:  Follow up HIV care. Chest fluttering, high blood pressure concern.  HPI/ROS:  She has continued to recover from her stroke and is now back singing at church. She is doing very well on her [place] and takes this every day as prescribed. She estimates no missed doses and 'does what I tell her to stay healthy.' She has been working with her diabetes doctor on managing her diabetes better through diet and medications. She had at one point stopped eating carbohydrates and started having low blood sugars (33); now that she has added some carbs back she has had some sugars >250. Checks multiple times a day. Worried about losing 7 lbs over the last month. She reports some constipation that she takes magnesium citrate for with good effect. Has noticed that since her steroid injection of the left shoulder she has had some chest fluttering and gittery/anxious feeling. Concerned it may be her heart.   Review of Systems  Constitutional: Negative for chills, fever and malaise/fatigue.  HENT: Negative for  tinnitus.   Eyes: Negative for blurred vision and photophobia.  Respiratory: Positive for cough. Negative for sputum production.   Cardiovascular: Positive for palpitations. Negative for chest pain.  Gastrointestinal: Negative for diarrhea, nausea and vomiting.  Genitourinary: Negative for dysuria.  Skin: Negative for rash.  Neurological: Positive for focal weakness (LUE weakness). Negative for speech change (slight left facial droop ) and headaches.  Psychiatric/Behavioral: The patient is nervous/anxious.     Past Medical History:  Diagnosis Date  . Asthma   . Diabetes mellitus without complication ([place]) 4-78-29   hx. NIDDM-dx. 3 weeks ago  . Hyperlipidemia    hx  . Seasonal allergies   . Stroke ([place])   . Ulcerative colitis ([place])     Outpatient Medications Prior to Visit  Medication Sig Dispense Refill  . acetaminophen (TYLENOL) 325 MG tablet Take 2 tablets (650 mg total) by mouth every 6 (six) hours as needed for mild pain (or temp > 37.5 C (99.5 F)).    Marland Kitchen albuterol (PROVENTIL HFA;VENTOLIN HFA) 108 (90 Base) MCG/ACT inhaler Inhale 2 puffs into the lungs every 6 (six) hours as needed for wheezing or shortness of breath. 1 Inhaler 0  . aspirin EC 81 MG EC tablet Take 1 tablet (81 mg total) by mouth daily.    Marland Kitchen atorvastatin (LIPITOR) 40 MG tablet TAKE 1 TABLET (40 MG TOTAL) BY MOUTH DAILY AT 6 PM. 30 tablet 5  . bictegravir-emtricitabine-tenofovir AF (BIKTARVY) 50-200-25 MG TABS tablet Take 1 tablet by mouth daily. 30 tablet 5  . citalopram (CELEXA)  10 MG tablet Take 1 tablet (10 mg total) by mouth at bedtime. 30 tablet 2  . diclofenac sodium (VOLTAREN) 1 % GEL Apply 1 application topically 3 (three) times daily. Left knee 3 Tube 4  . glipiZIDE (GLUCOTROL) 10 MG tablet Take 1 tablet (10 mg total) by mouth 2 (two) times daily before a meal. 60 tablet 3  . glucose 4 GM chewable tablet Chew 1 tablet (4 g total) by mouth as needed for low blood sugar. 30 tablet 3  . glucose blood  (FREESTYLE LITE) test strip 1 each by Other route 2 (two) times daily. 100 each 5  . methocarbamol (ROBAXIN) 500 MG tablet Take 1 tablet (500 mg total) by mouth 2 (two) times daily as needed for muscle spasms (sedation precaution). 30 tablet 0  . potassium chloride SA (K-DUR,KLOR-CON) 20 MEQ tablet Take 1 tablet (20 mEq total) by mouth daily. 30 tablet 5  . topiramate (TOPAMAX) 100 MG tablet TAKE ONE TABLET BY MOUTH AT BEDTIME AS NEEDED 30 tablet 2  . topiramate (TOPAMAX) 50 MG tablet Take 1 tablet (50 mg total) by mouth at bedtime. 30 tablet 2   No facility-administered medications prior to visit.      Allergies  Allergen Reactions  . Azithromycin Itching    Muscle aches  . Erythromycin Nausea And Vomiting  . Penicillins Nausea And Vomiting    Has patient had a PCN reaction causing immediate rash, facial/tongue/throat swelling, SOB or lightheadedness with hypotension: Yes Has patient had a PCN reaction causing severe rash involving mucus membranes or skin necrosis: No Has patient had a PCN reaction that required hospitalization No Has patient had a PCN reaction occurring within the last 10 years: No If all of the above answers are "NO", then may proceed with Cephalosporin use.   . Tetracycline Nausea And Vomiting    Social History   Tobacco Use  . Smoking status: Never Smoker  . Smokeless tobacco: Never Used  Substance Use Topics  . Alcohol use: Not Currently    Alcohol/week: 0.0 standard drinks    Comment: occasional  . Drug use: No    Objective:   Vitals:   06/01/18 1012  BP: (!) 152/83  Pulse: 76  Temp: 98.6 F (37 C)  TempSrc: Oral  Weight: 133 lb (60.3 kg)   Body mass index is 24.33 kg/m.  Physical Exam  Constitutional: She is oriented to person, place, and time. She appears well-developed and well-nourished.  Seated comfortably in chair. Appears well today and in good spirits.   HENT:  Mouth/Throat: Oropharynx is clear and moist and mucous membranes are  normal. No oral lesions. Normal dentition. No dental abscesses. No oropharyngeal exudate.  Eyes: Pupils are equal, round, and reactive to light. No scleral icterus.  Cardiovascular: Normal rate, regular rhythm and normal heart sounds.  No murmur heard. Pulmonary/Chest: Effort normal and breath sounds normal. No respiratory distress. She has no rales.  Abdominal: Soft. She exhibits no distension. There is no tenderness.  Musculoskeletal:  Left sided weakness. L wrist in splint.   Lymphadenopathy:    She has no cervical adenopathy.  Neurological: She is alert and oriented to person, place, and time.  Left facial droop   Skin: Skin is warm and dry. No rash noted.  Psychiatric: She has a normal mood and affect. Judgment normal.  Vitals reviewed.   Lab Results: HIV 1 RNA Quant (copies/mL)  Date Value  04/01/2018 39 (H)  02/19/2018 118,000   CD4 T Cell Abs (/uL)  Date Value  02/22/2018 500    Lab Results  Component Value Date   WBC 4.7 03/02/2018   HGB 11.4 (L) 03/02/2018   HCT 35.8 (L) 03/02/2018   MCV 87.5 03/02/2018   PLT 253 03/02/2018    Lab Results  Component Value Date   CREATININE 1.17 (H) 03/02/2018   BUN 14 03/02/2018   NA 139 03/02/2018   K 3.8 04/26/2018   CL 106 03/02/2018   CO2 24 03/02/2018    Lab Results  Component Value Date   ALT 36 03/02/2018   AST 24 03/02/2018   ALKPHOS 109 03/02/2018   BILITOT 0.8 03/02/2018     Assessment & Plan:   Problem List Items Addressed This Visit      Unprioritized   Diabetes mellitus type 2, uncontrolled, with complications ([place])    Trouble regulating blood sugars. I think most of her weight loss is through diet modification she has made. Recommended discussion about continuous glucose monitoring machine with her managing provider. I told her that she may be able to manage through diet alone based on what she tells me.       Healthcare maintenance    Hep B#1 and Hep A #1 given today.  She will need Hep B #2 in  25mat nurse visit.  Prevnar in 02/2019.  Pap smear at next appointment with me. Mammography has not been done yet - orders entered       HIV disease ([facility]    Reviewed labs with her today and congratulated on excellent job. Will continue her Biktarvy. Repeat viral load today. Discussed U=U concept in addition to safe sex counseling and prevention of other STIs. She will return in 4 months with VL/CD4 and CMET prior to visit.        Relevant Orders   HIV-1 RNA quant-no reflex-bld   HIV-1 RNA quant-no reflex-bld   T-helper cell (CD4)- ([facility] clinic only)   COMPLETE METABOLIC PANEL WITH GFR   Palpitations    She has normal cardiac exam today and regular controlled rhythm. Will defer EKG today. I suspect this as well as her high blood pressure and anxiety is related to the steroid injection. She does not think it is related to stress but possible. No further work up today but if continues to have high BP and these complaints may benefit from beta blocker.        Other Visit Diagnoses    Screening for breast cancer       Relevant Orders   MM Digital Screening     Return in about 4 months (around 09/30/2018).   SJanene Madeira MSN, NP-C ROak Point Surgical Suites LLCfor Infectious DNanty-GloPager: [phone]: [phone] 06/01/18  11:24 AM

## 2018-06-01 NOTE — Assessment & Plan Note (Signed)
Reviewed labs with her today and congratulated on excellent job. Will continue her Biktarvy. Repeat viral load today. Discussed U=U concept in addition to safe sex counseling and prevention of other STIs. She will return in 4 months with VL/CD4 and CMET prior to visit.

## 2018-06-01 NOTE — Assessment & Plan Note (Addendum)
Hep B#1 and Hep A #1 given today.  She will need Hep B #2 in 26mat nurse visit.  Prevnar in 02/2019.  Pap smear at next appointment with me. Mammography has not been done yet - orders entered

## 2018-06-02 ENCOUNTER — Ambulatory Visit: Payer: BLUE CROSS/BLUE SHIELD | Admitting: Occupational Therapy

## 2018-06-02 ENCOUNTER — Encounter: Payer: Self-pay | Admitting: Occupational Therapy

## 2018-06-02 DIAGNOSIS — R278 Other lack of coordination: Secondary | ICD-10-CM

## 2018-06-02 DIAGNOSIS — M6281 Muscle weakness (generalized): Secondary | ICD-10-CM | POA: Diagnosis not present

## 2018-06-02 NOTE — Telephone Encounter (Signed)
Spoke to the pharmacy and they state the error message just states insurance will not pay for refills, not saying PA is required.... They stated they will fax over PPW in reference to her Rx and they include the following: Glipizide, Potassium, Topamax and Lipitor.... Waiting on PPW to proceed

## 2018-06-02 NOTE — Therapy (Addendum)
[place] MAIN [facility] SERVICES [address] [place], [place], [address] Phone: [phone]   Fax:  [phone]  Occupational Therapy Treatment  Patient Details  Name: [name] MRN: [id number] Date of Birth: 05-19-1956 No data recorded  Encounter Date: 06/02/2018  OT End of Session - 06/02/18 1113    Visit Number  13    Number of Visits  24    Date for OT Re-Evaluation  06/24/18    Authorization Type  visit 3 of 10 for progress report period starting 05/20/2018    OT Start Time  0840    OT Stop Time  0925    OT Time Calculation (min)  45 min    Activity Tolerance  Patient tolerated treatment well    Behavior During Therapy  [facility] for tasks assessed/performed       Past Medical History:  Diagnosis Date  . Asthma   . Diabetes mellitus without complication ([place]) 9-70-26   hx. NIDDM-dx. 3 weeks ago  . Hyperlipidemia    hx  . Seasonal allergies   . Stroke ([place])   . Ulcerative colitis [facility])     Past Surgical History:  Procedure Laterality Date  . BUNIONECTOMY     bilateral and toe nails of big toes removed  . CESAREAN SECTION  1997/2006  . CHOLECYSTECTOMY N/A 12/01/2012   Procedure: LAPAROSCOPIC CHOLECYSTECTOMY;  Surgeon: Stark Klein, MD;  Location: [facility];  Service: General;  Laterality: N/A;  . COLONOSCOPY W/ POLYPECTOMY    . EYE SURGERY Bilateral 2013  . TONSILLECTOMY      There were no vitals filed for this visit.  Subjective Assessment - 06/02/18 1111    Subjective   Pt reports she has increased ROM in her LUE and decreased pain after having a steroid injection last week.    Pertinent History  Pt is a 62 y.o. female presenting with LUE weakness and decreased [place] as a result of R basal ganglia CVA on 8/08. Pt was hospitalized for 1 week where she received inpatient therapy and was then discharged home where she received home health therapy for 2 weeks. Prior to CVA, pt was independent for ADLs and IADLs. Currently, pt  requires increased assistance, increased time, and/or modified strategies for ADLs and IADLs.     Patient Stated Goals  Pt wants to be able to do everything with her L hand that her R hand can do, including making wigs, picking up laundry baskets, manipulating coins for shopping, and grooming hair.    Currently in Pain?  Yes    Pain Score  4     Pain Location  Arm    Pain Orientation  Left    Pain Descriptors / Indicators  Aching;Sharp;Stabbing    Pain Type  Acute pain;Chronic pain    Pain Onset  More than a month ago    Pain Frequency  Intermittent    Aggravating Factors   Moving left arm    Pain Relieving Factors  Moist heat    Multiple Pain Sites  No      OT TREATMENT  LUE ROM measurements were obtained  Neuromuscular re-education:   Pt. worked on left and [facility] task while grasping and flipping 2" large pegs on the [employer] placed at a tabletop surface. Pt followed a pattern for placement of the pegs. Pt required increased time when using her left hand to flip pegs once placed into board. Container of pegs was positioned on a low surface to  encourage patient to reach low. Pt demonstrates increased isolation of each digit and [place] during treatment today. Pt reports feeling more encouraged and gaining a positive outlook on treatment after finding pain relief from a recent steroid injection.                   OT Education - 06/02/18 1113    Education Details  Left hand [place] skills    Person(s) Educated  Patient    Methods  Explanation;Verbal cues;Demonstration    Comprehension  Verbalized understanding;Verbal cues required          OT Long Term Goals - 05/18/18 1655      OT LONG TERM GOAL #1   Title  Pt will decreased [place] by 15 secs of speed in order to independently manipulate coins during grocery shopping    Baseline  Pt drops coins when checking out at grocery store; [facility] 3 mins 06 secs    Time  12    Period  Weeks    Status  On-going    Target Date   06/24/18      OT LONG TERM GOAL #2   Title  Pt will increase LUE strength by 1 mm level in order to independently pick up laundry baskets during IADLs    Baseline  Pt is unable to pick up laundry baskets; MMT LUE 3+/5    Time  12    Period  Weeks    Status  On-going    Target Date  06/24/18      OT LONG TERM GOAL #3   Title  Pt will increase L shoulder ROM by 10 degrees in efficiently bathe and dress UB    Baseline  Pt is unable to use L hand to bathe and dress efficiently; L AROM shoulder abd 52 degrees    Time  12    Period  Weeks    Status  On-going    Target Date  06/24/18      OT LONG TERM GOAL #4   Title  Pt will increase L lateral pinch by 3# in order to clip nails independently    Baseline  Pt is unable to use L hand to clip nails; L lateral pinch is 3#    Time  12    Period  Weeks    Status  On-going    Target Date  06/24/18      OT LONG TERM GOAL #5   Title  Pt will increase L 3 point pinch by 4# to be able to independently button small buttons    Baseline  Pt requires increased time to button clothes using L hand; L 3 point pinch is 2#    Time  12    Period  Weeks    Status  On-going    Target Date  06/24/18            Plan - 06/02/18 1114    Clinical Impression Statement Upon arrival, pt. reported complaints of increased bilateral hand tingling which she typically experiences with low blood sugar. Pt's blood Glucose was checked 125. Pt. had a steroid injection last week at [facility]. Pt. presents today with increased left shoulder AROM for flexion and abduction without complaints of pain. Pt. presents today with 87 degrees of shoulder flexion, and 73 degrees of shoulder abduction painfree movement in sitting. Pt. reports being able to use her LUE to complete more of her self-care including: bathing her right upper arm and axilla without  pain. Pt. completed table toptasks for left hand [facility] skills today. Pt. continues to work on isolating each digit and  increasing voluntary movement of the left hand in order to promote independence during ADLs and IADLs.    Occupational Profile and client history currently impacting functional performance Pt. 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 Pt enjoys singing for leisure however has not been able to do so since experiencing CVA.    Occupational performance deficits (Please refer to evaluation for details):  ADL's;IADL's;Leisure    Rehab Potential  Good    Current Impairments/barriers affecting progress:  Positive: age, motivate, family suppory; Negative:     OT Frequency  2x / week    OT Duration  12 weeks    OT Treatment/Interventions  Self-care/ADL training;Therapeutic exercise;Neuromuscular education;Therapeutic activities;Manual Therapy;Passive range of motion    Clinical Decision Making  Several treatment options, min-mod task modification necessary    Consulted and Agree with Plan of Care  Patient       Patient will benefit from skilled therapeutic intervention in order to improve the following deficits and impairments:  Pain, Decreased cognition, Decreased range of motion, Decreased strength, Impaired perceived functional ability, Impaired UE functional use  Visit Diagnosis: Other lack of coordination    Problem List Patient Active Problem List   Diagnosis Date Noted  . Palpitations 06/01/2018  . Hypoglycemia 05/08/2018  . Neck pain 05/08/2018  . Paresthesia of both hands 05/08/2018  . Healthcare maintenance 04/01/2018  . Migraine 02/23/2018  . HIV disease ([place])   . Stroke (cerebrum) ([place]) - R basal ganglia d/t small vessel dz 02/18/2018  . HLD (hyperlipidemia) 08/02/2014  . Diabetes mellitus type 2, uncontrolled, with complications ([place]) 89/78/4784  . Ulcerative colitis ([place]) 06/26/2008    Oliver Hum, OTS 06/02/2018, 11:23 AM   This entire session was performed under direct supervision and direction of a licensed  therapist/therapist assistant . I have personally read, edited and approve of the note as written.  Harrel Carina, MS, OTR/L   [place] MAIN [facility] SERVICES [address] [place], [place], [address] Phone: [phone]   Fax:  [phone]  Name: [name] MRN: [id number] Date of Birth: 1955-09-24

## 2018-06-03 ENCOUNTER — Ambulatory Visit: Payer: BLUE CROSS/BLUE SHIELD | Admitting: Occupational Therapy

## 2018-06-03 DIAGNOSIS — M25532 Pain in left wrist: Secondary | ICD-10-CM | POA: Diagnosis not present

## 2018-06-03 DIAGNOSIS — R202 Paresthesia of skin: Secondary | ICD-10-CM | POA: Diagnosis not present

## 2018-06-03 LAB — HIV-1 RNA QUANT-NO REFLEX-BLD
HIV 1 RNA Quant: <20 copies/mL
HIV-1 RNA QUANT, LOG: NOT DETECTED {Log_copies}/mL

## 2018-06-08 ENCOUNTER — Ambulatory Visit: Payer: BLUE CROSS/BLUE SHIELD | Admitting: Occupational Therapy

## 2018-06-08 ENCOUNTER — Ambulatory Visit: Payer: Self-pay | Admitting: Internal Medicine

## 2018-06-08 ENCOUNTER — Ambulatory Visit (INDEPENDENT_AMBULATORY_CARE_PROVIDER_SITE_OTHER): Payer: BLUE CROSS/BLUE SHIELD | Admitting: Internal Medicine

## 2018-06-08 ENCOUNTER — Encounter: Payer: Self-pay | Admitting: Internal Medicine

## 2018-06-08 ENCOUNTER — Encounter: Payer: Self-pay | Admitting: Occupational Therapy

## 2018-06-08 VITALS — BP 122/82 | HR 72 | Temp 98.1°F | Wt 132.0 lb

## 2018-06-08 DIAGNOSIS — Z0289 Encounter for other administrative examinations: Secondary | ICD-10-CM

## 2018-06-08 DIAGNOSIS — G43009 Migraine without aura, not intractable, without status migrainosus: Secondary | ICD-10-CM

## 2018-06-08 DIAGNOSIS — K51919 Ulcerative colitis, unspecified with unspecified complications: Secondary | ICD-10-CM

## 2018-06-08 DIAGNOSIS — R278 Other lack of coordination: Secondary | ICD-10-CM

## 2018-06-08 DIAGNOSIS — E1165 Type 2 diabetes mellitus with hyperglycemia: Secondary | ICD-10-CM | POA: Diagnosis not present

## 2018-06-08 DIAGNOSIS — E782 Mixed hyperlipidemia: Secondary | ICD-10-CM | POA: Diagnosis not present

## 2018-06-08 DIAGNOSIS — B20 Human immunodeficiency virus [HIV] disease: Secondary | ICD-10-CM

## 2018-06-08 DIAGNOSIS — I63511 Cerebral infarction due to unspecified occlusion or stenosis of right middle cerebral artery: Secondary | ICD-10-CM

## 2018-06-08 DIAGNOSIS — M6281 Muscle weakness (generalized): Secondary | ICD-10-CM | POA: Diagnosis not present

## 2018-06-08 DIAGNOSIS — F339 Major depressive disorder, recurrent, unspecified: Secondary | ICD-10-CM

## 2018-06-08 NOTE — Therapy (Addendum)
[place] MAIN [facility] SERVICES [address] [place], [place], [address] Phone: [phone]   Fax:  [phone]  Occupational Therapy Treatment  Patient Details  Name: [name] MRN: [id number] Date of Birth: [birth year] No data recorded  Encounter Date: 06/08/2018  OT End of Session - 06/08/18 1030    Visit Number  14    Number of Visits  24    Date for OT Re-Evaluation  06/24/18    Authorization Type  visit 4 of 10 for progress report period starting 05/20/2018    OT Start Time  1018    OT Stop Time  1100    OT Time Calculation (min)  42 min    Activity Tolerance  Patient tolerated treatment well    Behavior During Therapy  [facility] for tasks assessed/performed       Past Medical History:  Diagnosis Date  . Asthma   . Diabetes mellitus without complication ([place]) 08-10-76   hx. NIDDM-dx. 3 weeks ago  . Hyperlipidemia    hx  . Seasonal allergies   . Stroke ([place])   . Ulcerative colitis [facility])     Past Surgical History:  Procedure Laterality Date  . BUNIONECTOMY     bilateral and toe nails of big toes removed  . CESAREAN SECTION  1997/2006  . CHOLECYSTECTOMY N/A 12/01/2012   Procedure: LAPAROSCOPIC CHOLECYSTECTOMY;  Surgeon: Stark Klein, MD;  Location: [facility]S;  Service: General;  Laterality: N/A;  . COLONOSCOPY W/ POLYPECTOMY    . EYE SURGERY Bilateral 2013  . TONSILLECTOMY      There were no vitals filed for this visit.  Subjective Assessment - 06/08/18 1024    Subjective   Pt reports having a nerve conduction test done and getting a report of no nerve damage.    Pertinent History  Pt is a 62 y.o. female presenting with LUE weakness and decreased [place] as a result of R basal ganglia CVA on 8/08. Pt was hospitalized for 1 week where she received inpatient therapy and was then discharged home where she received home health therapy for 2 weeks. Prior to CVA, pt was independent for ADLs and IADLs. Currently, pt requires increased  assistance, increased time, and/or modified strategies for ADLs and IADLs.     Patient Stated Goals  Pt wants to be able to do everything with her L hand that her R hand can do, including making wigs, picking up laundry baskets, manipulating coins for shopping, and grooming hair.    Currently in Pain?  Yes    Pain Score  2     Pain Location  Shoulder    Pain Orientation  Left    Pain Descriptors / Indicators  Aching    Pain Type  Acute pain;Chronic pain    Pain Onset  More than a month ago    Pain Frequency  Intermittent      OT TREATMENT  Neuromuscular re-education:  Pt. worked on grasping, and positioning magnetic hooks on a whiteboard positioned at a vertical angle on the tabletop. Pt. worked on left hand [facility] skills grasping 1/2" flat washers, and placing them on the hooks at various heights. Pt was not able to store multiple washers in her palm to translate them to her finger tips at this time. Pt was able to isolate digit movements to move washers in her hand one at a time. Pt. worked on left hand [place] grasping 1/2" small magnetic pegs, disconnecting them, and placing them on a  positioned at a vertical angle at a tabletop. Pt. worked on removing the pegs while opposing thumb opposition to the tip of the 2nd digit. Pt was not able to disconnect pegs that were positioned end-to-end using her left hand.    Self-care/IADLs:  Pt completed simulated laundry tasks  folding linens, as well as motor planning through anticipating reaching for the handle of a laundry basket. Pt. Education was provided about compensatory strategies, movement patterns, and motor planning when reaching for a small empty laundry basket, as well as one with a few linens, towels, and washcloths.  Pt was able to fold various items while standing. Pt demonstrates postural compensatory patterns such as leaning laterally and posteriorly when folding larger items. Pt did not experience pain during the task today. Pt was educated  on strategies to use to address motor planning motor planning when completing tasks at home as pt. reports she has been doing laundry at home.     OT Education - 06/08/18 1030    Education Details  Left hand [place] skills, reaching    Person(s) Educated  Patient    Methods  Explanation;Verbal cues;Demonstration    Comprehension  Verbalized understanding;Verbal cues required          OT Long Term Goals - 05/18/18 1655      OT LONG TERM GOAL #1   Title  Pt will decreased [place] by 15 secs of speed in order to independently manipulate coins during grocery shopping    Baseline  Pt drops coins when checking out at grocery store; [facility] 3 mins 06 secs    Time  12    Period  Weeks    Status  On-going    Target Date  06/24/18      OT LONG TERM GOAL #2   Title  Pt will increase LUE strength by 1 mm level in order to independently pick up laundry baskets during IADLs    Baseline  Pt is unable to pick up laundry baskets; MMT LUE 3+/5    Time  12    Period  Weeks    Status  On-going    Target Date  06/24/18      OT LONG TERM GOAL #3   Title  Pt will increase L shoulder ROM by 10 degrees in efficiently bathe and dress UB    Baseline  Pt is unable to use L hand to bathe and dress efficiently; L AROM shoulder abd 52 degrees    Time  12    Period  Weeks    Status  On-going    Target Date  06/24/18      OT LONG TERM GOAL #4   Title  Pt will increase L lateral pinch by 3# in order to clip nails independently    Baseline  Pt is unable to use L hand to clip nails; L lateral pinch is 3#    Time  12    Period  Weeks    Status  On-going    Target Date  06/24/18      OT LONG TERM GOAL #5   Title  Pt will increase L 3 point pinch by 4# to be able to independently button small buttons    Baseline  Pt requires increased time to button clothes using L hand; L 3 point pinch is 2#    Time  12    Period  Weeks    Status  On-going    Target Date  06/24/18  Plan - 06/08/18 1030     Clinical Impression Statement Pt presents today with decreased pain compared to previous sessions. Pt. Reports she is using her left UE during more tasks at home.Pt tolerated treatment well as she was able to engage and use her left UE during reaching tasks and functional tasks without complaints of increased pain. Pt continues to work on left hand [facility] tasks to increase functional hand use. Pt worked on incorporating reaching during [employer] tasks today. Pt demonstrates impaired motor planning with the LUE ROM during ADLs, and functional tasks. Pt. presented with increased compensation proximally in the shoulder, and trunk during reaching, and laundry tasks. Pt. has has experienced improvement with pain today.     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 Pt enjoys singing for leisure however has not been able to do so since experiencing CVA.    Occupational performance deficits (Please refer to evaluation for details):  ADL's;IADL's;Leisure    Rehab Potential  Good    Current Impairments/barriers affecting progress:  Positive: age, motivate, family suppory; Negative:     OT Frequency  2x / week    OT Duration  12 weeks    OT Treatment/Interventions  Self-care/ADL training;Therapeutic exercise;Neuromuscular education;Therapeutic activities;Manual Therapy;Passive range of motion    Clinical Decision Making  Several treatment options, min-mod task modification necessary    Consulted and Agree with Plan of Care  Patient       Patient will benefit from skilled therapeutic intervention in order to improve the following deficits and impairments:  Pain, Decreased cognition, Decreased range of motion, Decreased strength, Impaired perceived functional ability, Impaired UE functional use  Visit Diagnosis: Other lack of coordination    Problem List Patient Active  Problem List   Diagnosis Date Noted  . Palpitations 06/01/2018  . Hypoglycemia 05/08/2018  . Neck pain 05/08/2018  . Paresthesia of both hands 05/08/2018  . Healthcare maintenance 04/01/2018  . Migraine 02/23/2018  . HIV disease ([place])   . Stroke (cerebrum) ([place]) - R basal ganglia d/t small vessel dz 02/18/2018  . HLD (hyperlipidemia) 08/02/2014  . Diabetes mellitus type 2, uncontrolled, with complications ([place]) 19/07/2222  . Ulcerative colitis ([place]) 06/26/2008    Loran Senters, OTS 06/08/2018, 5:30 PM   This entire session was performed under direct supervision and direction of a licensed therapist/therapist assistant . I have personally read, edited and approve of the note as written.  Harrel Carina, MS, OTR/L   [place] MAIN [facility] SERVICES [address] [place], [place], [address] Phone: [phone]   Fax:  [phone]  Name: [name] MRN: [id number] Date of Birth: 1956-05-13

## 2018-06-08 NOTE — Progress Notes (Signed)
Subjective:    Patient ID: [name], female    DOB: 1956/04/08, 62 y.o.   MRN: [id number]  HPI  Pt presents to the clinic today for 3 month follow up of chronic conditions.  DM 2: Her last A1C was 9.1%. She does not monitor her sugars. She is taking Glipizide as prescribed. She checks her feet routinely. Her last eye exam was in 2018. Flu 04/2017. Pneumovax 02/2018. She does not follow with endocrinology.  HLD: With Hx of Stroke with left hemiparesis. She is currently in rehab, following with Physical Medicine and Neurology. She is taking Atorvastatin as prescribed and denies myalgias. She is on ASA.  HIV: Her last viral load was undetectable, CD4 count 500. She is taking Biktarvy as prescribed. She follows with ID.  Migraines: Chronic but stable on Topamax. She follow with neurology.  Ulcerative Colitis: Currently not an issue. Not medicated. Currently not following with GI.  Depression: Secondary to recent stroke, dx of HIV. She was started on Sertraline at her last visit, but this was switched to Celexa by another provider. She reports.  Review of Systems      Past Medical History:  Diagnosis Date  . Asthma   . Diabetes mellitus without complication ([place]) 2-70-35   hx. NIDDM-dx. 3 weeks ago  . Hyperlipidemia    hx  . Seasonal allergies   . Stroke ([place])   . Ulcerative colitis ([place])     Current Outpatient Medications  Medication Sig Dispense Refill  . acetaminophen (TYLENOL) 325 MG tablet Take 2 tablets (650 mg total) by mouth every 6 (six) hours as needed for mild pain (or temp > 37.5 C (99.5 F)).    Marland Kitchen albuterol (PROVENTIL HFA;VENTOLIN HFA) 108 (90 Base) MCG/ACT inhaler Inhale 2 puffs into the lungs every 6 (six) hours as needed for wheezing or shortness of breath. 1 Inhaler 0  . aspirin EC 81 MG EC tablet Take 1 tablet (81 mg total) by mouth daily.    Marland Kitchen atorvastatin (LIPITOR) 40 MG tablet TAKE 1 TABLET (40 MG TOTAL) BY MOUTH DAILY AT 6 PM. 30 tablet 5  .  bictegravir-emtricitabine-tenofovir AF (BIKTARVY) 50-200-25 MG TABS tablet Take 1 tablet by mouth daily. 30 tablet 5  . citalopram (CELEXA) 10 MG tablet TAKE ONE TABLET BY MOUTH AT BEDTIME 30 tablet 1  . diclofenac sodium (VOLTAREN) 1 % GEL Apply 1 application topically 3 (three) times daily. Left knee 3 Tube 4  . glipiZIDE (GLUCOTROL) 10 MG tablet Take 1 tablet (10 mg total) by mouth 2 (two) times daily before a meal. 60 tablet 3  . glucose 4 GM chewable tablet Chew 1 tablet (4 g total) by mouth as needed for low blood sugar. 30 tablet 3  . glucose blood (FREESTYLE LITE) test strip 1 each by Other route 2 (two) times daily. 100 each 5  . methocarbamol (ROBAXIN) 500 MG tablet Take 1 tablet (500 mg total) by mouth 2 (two) times daily as needed for muscle spasms (sedation precaution). 30 tablet 0  . potassium chloride SA (K-DUR,KLOR-CON) 20 MEQ tablet Take 1 tablet (20 mEq total) by mouth daily. 30 tablet 5  . topiramate (TOPAMAX) 100 MG tablet TAKE ONE TABLET BY MOUTH AT BEDTIME AS NEEDED 30 tablet 2  . topiramate (TOPAMAX) 50 MG tablet Take 1 tablet (50 mg total) by mouth at bedtime. 30 tablet 2   No current facility-administered medications for this visit.     Allergies  Allergen Reactions  . Azithromycin Itching  Muscle aches  . Erythromycin Nausea And Vomiting  . Penicillins Nausea And Vomiting    Has patient had a PCN reaction causing immediate rash, facial/tongue/throat swelling, SOB or lightheadedness with hypotension: Yes Has patient had a PCN reaction causing severe rash involving mucus membranes or skin necrosis: No Has patient had a PCN reaction that required hospitalization No Has patient had a PCN reaction occurring within the last 10 years: No If all of the above answers are "NO", then may proceed with Cephalosporin use.   . Tetracycline Nausea And Vomiting    Family History  Problem Relation Age of Onset  . Hypertension Mother   . Diabetes Mother   . Cancer Father         lung ca  . Seizures Daughter        84yr    Social History   Socioeconomic History  . Marital status: Legally Separated    Spouse name: Not on file  . Number of children: Not on file  . Years of education: Not on file  . Highest education level: Not on file  Occupational History  . Not on file  Social Needs  . Financial resource strain: Not on file  . Food insecurity:    Worry: Not on file    Inability: Not on file  . Transportation needs:    Medical: Not on file    Non-medical: Not on file  Tobacco Use  . Smoking status: Never Smoker  . Smokeless tobacco: Never Used  Substance and Sexual Activity  . Alcohol use: Not Currently    Alcohol/week: 0.0 standard drinks    Comment: occasional  . Drug use: No  . Sexual activity: Not Currently    Partners: Male    Comment: offered condoms  Lifestyle  . Physical activity:    Days per week: Not on file    Minutes per session: Not on file  . Stress: Not on file  Relationships  . Social connections:    Talks on phone: Not on file    Gets together: Not on file    Attends religious service: Not on file    Active member of club or organization: Not on file    Attends meetings of clubs or organizations: Not on file    Relationship status: Not on file  . Intimate partner violence:    Fear of current or ex partner: Not on file    Emotionally abused: Not on file    Physically abused: Not on file    Forced sexual activity: Not on file  Other Topics Concern  . Not on file  Social History Narrative  . Not on file     Constitutional: Denies fever, malaise, fatigue, headache or abrupt weight changes.  Respiratory: Denies difficulty breathing, shortness of breath, cough or sputum production.   Cardiovascular: Denies chest pain, chest tightness, palpitations or swelling in the hands or feet.  Skin: Denies redness, rashes, lesions or ulcercations.  Neurological: Pt reports numbness and tingling of left upper extremity. Denies  dizziness, difficulty with memory, difficulty with speech or problems with balance and coordination.  Psych: Pt reports depression, insomnia. Denies anxiety, SI/HI.  No other specific complaints in a complete review of systems (except as listed in HPI above).  Objective:   Physical Exam   BP 122/82   Pulse 72   Temp 98.1 F (36.7 C) (Oral)   Wt 132 lb (59.9 kg)   BMI 24.14 kg/m  Wt Readings from Last 3 Encounters:  06/08/18 132 lb (59.9 kg)  06/01/18 133 lb (60.3 kg)  05/07/18 137 lb 12 oz (62.5 kg)    General: Appears her stated age, well developed, well nourished in NAD. Skin: Warm, dry and intact. No ulcerations noted. Cardiovascular: Normal rate and rhythm. S1,S2 noted.  No murmur, rubs or gallops noted.  Pulmonary/Chest: Normal effort and positive vesicular breath sounds. No respiratory distress. No wheezes, rales or ronchi noted.  Neurological: Alert and oriented. Sensation intact to BLE. Left wrist in brace. Psychiatric: Mood and affect normal. Behavior is normal. Judgment and thought content normal.    BMET    Component Value Date/Time   NA 139 03/02/2018 0641   NA 138 06/23/2014 0503   K 3.8 04/26/2018 1436   K 4.2 06/23/2014 0503   CL 106 03/02/2018 0641   CL 104 06/23/2014 0503   CO2 24 03/02/2018 0641   CO2 27 06/23/2014 0503   GLUCOSE 133 (H) 03/02/2018 0641   GLUCOSE 246 (H) 06/23/2014 0503   BUN 14 03/02/2018 0641   BUN 17 06/23/2014 0503   CREATININE 1.17 (H) 03/02/2018 0641   CREATININE 0.86 06/23/2014 0503   CALCIUM 9.1 03/02/2018 0641   CALCIUM 8.7 06/23/2014 0503   GFRNONAA 49 (L) 03/02/2018 0641   GFRNONAA >60 06/23/2014 0503   GFRAA 57 (L) 03/02/2018 0641   GFRAA >60 06/23/2014 0503    Lipid Panel     Component Value Date/Time   CHOL 243 (H) 02/19/2018 0301   CHOL 211 (H) 06/22/2014 1432   TRIG 349 (H) 02/19/2018 0301   TRIG 473 (H) 06/22/2014 1432   HDL 37 (L) 02/19/2018 0301   HDL 42 06/22/2014 1432   CHOLHDL 6.6 02/19/2018 0301     VLDL 70 (H) 02/19/2018 0301   VLDL SEE COMMENT 06/22/2014 1432   LDLCALC 136 (H) 02/19/2018 0301   LDLCALC SEE COMMENT 06/22/2014 1432    CBC    Component Value Date/Time   WBC 4.7 03/02/2018 0641   RBC 4.09 03/02/2018 0641   HGB 11.4 (L) 03/02/2018 0641   HGB 12.2 06/23/2014 0503   HCT 35.8 (L) 03/02/2018 0641   HCT 37.2 06/23/2014 0503   PLT 253 03/02/2018 0641   PLT 251 06/23/2014 0503   MCV 87.5 03/02/2018 0641   MCV 89 06/23/2014 0503   MCH 27.9 03/02/2018 0641   MCHC 31.8 03/02/2018 0641   RDW 12.9 03/02/2018 0641   RDW 13.6 06/23/2014 0503   LYMPHSABS 1.8 02/23/2018 2112   LYMPHSABS 1.8 06/23/2014 0503   MONOABS 0.7 02/23/2018 2112   MONOABS 0.4 06/23/2014 0503   EOSABS 0.0 02/23/2018 2112   EOSABS 0.0 06/23/2014 0503   BASOSABS 0.0 02/23/2018 2112   BASOSABS 0.0 06/23/2014 0503    Hgb A1C Lab Results  Component Value Date   HGBA1C 9.1 (H) 02/19/2018           Assessment & Plan:

## 2018-06-09 ENCOUNTER — Other Ambulatory Visit: Payer: Self-pay | Admitting: Orthopaedic Surgery

## 2018-06-09 ENCOUNTER — Encounter: Payer: Self-pay | Admitting: Internal Medicine

## 2018-06-09 ENCOUNTER — Ambulatory Visit
Admission: RE | Admit: 2018-06-09 | Discharge: 2018-06-09 | Disposition: A | Discharge status: Home / Self Care | Payer: BLUE CROSS/BLUE SHIELD | Source: Ambulatory Visit | Attending: Infectious Diseases | Admitting: Infectious Diseases

## 2018-06-09 DIAGNOSIS — Z1239 Encounter for other screening for malignant neoplasm of breast: Secondary | ICD-10-CM

## 2018-06-09 DIAGNOSIS — M25532 Pain in left wrist: Secondary | ICD-10-CM

## 2018-06-09 DIAGNOSIS — Z1231 Encounter for screening mammogram for malignant neoplasm of breast: Secondary | ICD-10-CM | POA: Diagnosis not present

## 2018-06-09 LAB — COMPREHENSIVE METABOLIC PANEL
ALBUMIN: 4.1 g/dL (ref 3.5–5.2)
ALK PHOS: 83 U/L (ref 39–117)
ALT: 36 U/L — AB (ref 0–35)
AST: 18 U/L (ref 0–37)
BILIRUBIN TOTAL: 0.4 mg/dL (ref 0.2–1.2)
BUN: 17 mg/dL (ref 6–23)
CALCIUM: 9.3 mg/dL (ref 8.4–10.5)
CO2: 25 meq/L (ref 19–32)
Chloride: 107 mEq/L (ref 96–112)
Creatinine, Ser: 1.02 mg/dL (ref 0.40–1.20)
GFR: 70.77 mL/min (ref >60.00)
Glucose, Bld: 169 mg/dL — ABNORMAL HIGH (ref 70–99)
Potassium: 3.6 mEq/L (ref 3.5–5.1)
Sodium: 140 mEq/L (ref 135–145)
TOTAL PROTEIN: 7.1 g/dL (ref 6.0–8.3)

## 2018-06-09 LAB — LIPID PANEL
CHOLESTEROL: 123 mg/dL (ref 0–200)
HDL: 41.7 mg/dL (ref >39.00)
LDL Cholesterol: 51 mg/dL (ref 0–99)
NonHDL: 81.68
TRIGLYCERIDES: 154 mg/dL — AB (ref 0.0–149.0)
Total CHOL/HDL Ratio: 3
VLDL: 30.8 mg/dL (ref 0.0–40.0)

## 2018-06-09 LAB — CBC
HCT: 36.2 % (ref 36.0–46.0)
Hemoglobin: 11.9 g/dL — ABNORMAL LOW (ref 12.0–15.0)
MCHC: 33 g/dL (ref 30.0–36.0)
MCV: 93.5 fl (ref 78.0–100.0)
PLATELETS: 228 10*3/uL (ref 150.0–400.0)
RBC: 3.87 Mil/uL (ref 3.87–5.11)
RDW: 16.1 % — ABNORMAL HIGH (ref 11.5–15.5)
WBC: 6.2 10*3/uL (ref 4.0–10.5)

## 2018-06-09 LAB — HEMOGLOBIN A1C: Hgb A1c MFr Bld: 6.6 % — ABNORMAL HIGH (ref 4.6–6.5)

## 2018-06-09 LAB — MICROALBUMIN / CREATININE URINE RATIO
CREATININE, U: 180.7 mg/dL
Microalb Creat Ratio: 0.6 mg/g (ref 0.0–30.0)
Microalb, Ur: 1.2 mg/dL (ref 0.0–1.9)

## 2018-06-09 NOTE — Assessment & Plan Note (Signed)
Continue Topamax Continue to follow with neurology

## 2018-06-09 NOTE — Patient Instructions (Signed)
Fat and Cholesterol Restricted Diet Getting too much fat and cholesterol in your diet may cause health problems. Following this diet helps keep your fat and cholesterol at normal levels. This can keep you from getting sick. What types of fat should I choose?  Choose monosaturated and polyunsaturated fats. These are found in foods such as olive oil, canola oil, flaxseeds, walnuts, almonds, and seeds.  Eat more omega-3 fats. Good choices include salmon, mackerel, sardines, tuna, flaxseed oil, and ground flaxseeds.  Limit saturated fats. These are in animal products such as meats, butter, and cream. They can also be in plant products such as palm oil, palm kernel oil, and coconut oil.  Avoid foods with partially hydrogenated oils in them. These contain trans fats. Examples of foods that have trans fats are stick margarine, some tub margarines, cookies, crackers, and other baked goods. What general guidelines do I need to follow?  Check food labels. Look for the words "trans fat" and "saturated fat."  When preparing a meal: ? Fill half of your plate with vegetables and green salads. ? Fill one fourth of your plate with whole grains. Look for the word "whole" as the first word in the ingredient list. ? Fill one fourth of your plate with lean protein foods.  Eat more foods that have fiber, like apples, carrots, beans, peas, and barley.  Eat more home-cooked foods. Eat less at restaurants and buffets.  Limit or avoid alcohol.  Limit foods high in starch and sugar.  Limit fried foods.  Cook foods without frying them. Baking, boiling, grilling, and broiling are all great options.  Lose weight if you are overweight. Losing even a small amount of weight can help your overall health. It can also help prevent diseases such as diabetes and heart disease. What foods can I eat? Grains Whole grains, such as whole wheat or whole grain breads, crackers, cereals, and pasta. Unsweetened oatmeal,  bulgur, barley, quinoa, or brown rice. Corn or whole wheat flour tortillas. Vegetables Fresh or frozen vegetables (raw, steamed, roasted, or grilled). Green salads. Fruits All fresh, canned (in natural juice), or frozen fruits. Meat and Other Protein Products Ground beef (85% or leaner), grass-fed beef, or beef trimmed of fat. Skinless chicken or turkey. Ground chicken or turkey. Pork trimmed of fat. All fish and seafood. Eggs. Dried beans, peas, or lentils. Unsalted nuts or seeds. Unsalted canned or dry beans. Dairy Low-fat dairy products, such as skim or 1% milk, 2% or reduced-fat cheeses, low-fat ricotta or cottage cheese, or plain low-fat yogurt. Fats and Oils Tub margarines without trans fats. Light or reduced-fat mayonnaise and salad dressings. Avocado. Olive, canola, sesame, or safflower oils. Natural peanut or almond butter (choose ones without added sugar and oil). The items listed above may not be a complete list of recommended foods or beverages. Contact your dietitian for more options. What foods are not recommended? Grains White bread. White pasta. White rice. Cornbread. Bagels, pastries, and croissants. Crackers that contain trans fat. Vegetables White potatoes. Corn. Creamed or fried vegetables. Vegetables in a cheese sauce. Fruits Dried fruits. Canned fruit in light or heavy syrup. Fruit juice. Meat and Other Protein Products Fatty cuts of meat. Ribs, chicken wings, bacon, sausage, bologna, salami, chitterlings, fatback, hot dogs, bratwurst, and packaged luncheon meats. Liver and organ meats. Dairy Whole or 2% milk, cream, half-and-half, and cream cheese. Whole milk cheeses. Whole-fat or sweetened yogurt. Full-fat cheeses. Nondairy creamers and whipped toppings. Processed cheese, cheese spreads, or cheese curds. Sweets and Desserts Corn   syrup, sugars, honey, and molasses. Candy. Jam and jelly. Syrup. Sweetened cereals. Cookies, pies, cakes, donuts, muffins, and ice  cream. Fats and Oils Butter, stick margarine, lard, shortening, ghee, or bacon fat. Coconut, palm kernel, or palm oils. Beverages Alcohol. Sweetened drinks (such as sodas, lemonade, and fruit drinks or punches). The items listed above may not be a complete list of foods and beverages to avoid. Contact your dietitian for more information. This information is not intended to replace advice given to you by your health care provider. Make sure you discuss any questions you have with your health care provider. Document Released: 12/30/2011 Document Revised: 03/06/2016 Document Reviewed: 09/29/2013 Elsevier Interactive Patient Education  2018 [employer]vier Inc[employer]  

## 2018-06-09 NOTE — Assessment & Plan Note (Signed)
HIV and CD 4 counts reviewed Continue  Biktarvy She will continue to follow with ID

## 2018-06-09 NOTE — Assessment & Plan Note (Signed)
CMET and Lipid profile today Encouraged her to consume a low fat diet Continue Atorvastatin for now

## 2018-06-09 NOTE — Assessment & Plan Note (Signed)
Controlled off meds CBC today

## 2018-06-09 NOTE — Assessment & Plan Note (Signed)
A1C and microalbumin today Encouraged her to consume a low carb diet Continue Glipizide Encouraged yearly eye exam.  Pneumovax UTD She declines flu shot today Foot exam today

## 2018-06-09 NOTE — Assessment & Plan Note (Signed)
No real improvement with Celexa Consider stopping, adding Remeron for sleep and appetite Support offered today

## 2018-06-09 NOTE — Assessment & Plan Note (Signed)
With left hemiparesis In therapy Following with physical med and neurology Continue Atorvastatin and ASA CBC, CMET and Lipid profile today

## 2018-06-11 DIAGNOSIS — H524 Presbyopia: Secondary | ICD-10-CM | POA: Diagnosis not present

## 2018-06-11 DIAGNOSIS — E089 Diabetes mellitus due to underlying condition without complications: Secondary | ICD-10-CM | POA: Diagnosis not present

## 2018-06-11 DIAGNOSIS — H2513 Age-related nuclear cataract, bilateral: Secondary | ICD-10-CM | POA: Diagnosis not present

## 2018-06-11 DIAGNOSIS — H18613 Keratoconus, stable, bilateral: Secondary | ICD-10-CM | POA: Diagnosis not present

## 2018-06-11 DIAGNOSIS — E119 Type 2 diabetes mellitus without complications: Secondary | ICD-10-CM | POA: Diagnosis not present

## 2018-06-12 ENCOUNTER — Other Ambulatory Visit: Payer: Self-pay | Admitting: Internal Medicine

## 2018-06-12 MED ORDER — MIRTAZAPINE 7.5 MG PO TABS: 7.5000 mg | ORAL_TABLET | Freq: Every day | ORAL | 0 refills | Status: DC

## 2018-06-12 NOTE — Progress Notes (Signed)
remer

## 2018-06-14 ENCOUNTER — Ambulatory Visit: Payer: BLUE CROSS/BLUE SHIELD | Attending: Physical Medicine & Rehabilitation | Admitting: Occupational Therapy

## 2018-06-14 ENCOUNTER — Encounter: Payer: Self-pay | Admitting: Occupational Therapy

## 2018-06-14 DIAGNOSIS — R278 Other lack of coordination: Secondary | ICD-10-CM | POA: Diagnosis not present

## 2018-06-14 DIAGNOSIS — M6281 Muscle weakness (generalized): Secondary | ICD-10-CM | POA: Diagnosis not present

## 2018-06-14 NOTE — Therapy (Signed)
[place] MAIN [facility] SERVICES [address] [place], [place], [address] Phone: [phone]   Fax:  [phone]  Occupational Therapy Treatment  Patient Details  Name: [name] MRN: [id number] Date of Birth: 1955/09/21 No data recorded  Encounter Date: 06/14/2018  OT End of Session - 06/14/18 1149    Visit Number  15    Number of Visits  24    Date for OT Re-Evaluation  06/24/18    Authorization Type  visit 5 of 10 for progress report period starting 05/20/2018    OT Start Time  1105    OT Stop Time  1145    OT Time Calculation (min)  40 min    Activity Tolerance  Patient tolerated treatment well    Behavior During Therapy  [facility] for tasks assessed/performed       Past Medical History:  Diagnosis Date  . Asthma   . Diabetes mellitus without complication ([place]) 3-53-29   hx. NIDDM-dx. 3 weeks ago  . Hyperlipidemia    hx  . Seasonal allergies   . Stroke ([place])   . Ulcerative colitis [facility])     Past Surgical History:  Procedure Laterality Date  . BUNIONECTOMY     bilateral and toe nails of big toes removed  . CESAREAN SECTION  1997/2006  . CHOLECYSTECTOMY N/A 12/01/2012   Procedure: LAPAROSCOPIC CHOLECYSTECTOMY;  Surgeon: Stark Klein, MD;  Location: [facility];  Service: General;  Laterality: N/A;  . COLONOSCOPY W/ POLYPECTOMY    . EYE SURGERY Bilateral 2013  . TONSILLECTOMY      There were no vitals filed for this visit.  Subjective Assessment - 06/14/18 1107    Subjective   Pt. reports having a CT schedule for her right hand on Dec. 10th.     Pertinent History  Pt is a 62 y.o. female presenting with LUE weakness and decreased [place] as a result of R basal ganglia CVA on 8/08. Pt was hospitalized for 1 week where she received inpatient therapy and was then discharged home where she received home health therapy for 2 weeks. Prior to CVA, pt was independent for ADLs and IADLs. Currently, pt requires increased assistance, increased  time, and/or modified strategies for ADLs and IADLs.     Patient Stated Goals  Pt wants to be able to do everything with her L hand that her R hand can do, including making wigs, picking up laundry baskets, manipulating coins for shopping, and grooming hair.    Currently in Pain?  No/denies      OT TREATMENT    Neuro muscular re-education:  Pt. Worked on grasping, and reaching to place coins in a larger jar positioned at various angle to encourage reaching with no pain. Pt. worked on left hand grasping, and manipulating 1/2" washers from a magnetic dish using point grasp pattern. Pt. worked on reaching up with her left UE to place the washer over a small precise target on vertical dowels positioned at various angles. Stabilization of the dowel was required. Pt. Worked on untying knots progressing from larger rope to thinner string.  Therapeutic Exercise:  Pt. Worked on [employer] using a swiss ball on a flat surface in standing. Pt. Worked on tabletop exercises using medium to large wedges for increasing the incline from a seated position. Pt. Worked on the [employer] for 3 min.with no resistance. Forward , and reverse motion. Pt. Tolerated it well, without any complaints of pain.  OT Education - 06/14/18 1144    Education Details  Left [place] skills, reaching    Person(s) Educated  Patient    Methods  Explanation;Verbal cues;Demonstration    Comprehension  Verbalized understanding;Verbal cues required          OT Long Term Goals - 05/18/18 1655      OT LONG TERM GOAL #1   Title  Pt will decreased [place] by 15 secs of speed in order to independently manipulate coins during grocery shopping    Baseline  Pt drops coins when checking out at grocery store; [facility] 3 mins 06 secs    Time  12    Period  Weeks    Status  On-going    Target Date  06/24/18      OT LONG TERM GOAL #2   Title  Pt will increase LUE strength by 1 mm level in order to  independently pick up laundry baskets during IADLs    Baseline  Pt is unable to pick up laundry baskets; MMT LUE 3+/5    Time  12    Period  Weeks    Status  On-going    Target Date  06/24/18      OT LONG TERM GOAL #3   Title  Pt will increase L shoulder ROM by 10 degrees in efficiently bathe and dress UB    Baseline  Pt is unable to use L hand to bathe and dress efficiently; L AROM shoulder abd 52 degrees    Time  12    Period  Weeks    Status  On-going    Target Date  06/24/18      OT LONG TERM GOAL #4   Title  Pt will increase L lateral pinch by 3# in order to clip nails independently    Baseline  Pt is unable to use L hand to clip nails; L lateral pinch is 3#    Time  12    Period  Weeks    Status  On-going    Target Date  06/24/18      OT LONG TERM GOAL #5   Title  Pt will increase L 3 point pinch by 4# to be able to independently button small buttons    Baseline  Pt requires increased time to button clothes using L hand; L 3 point pinch is 2#    Time  12    Period  Weeks    Status  On-going    Target Date  06/24/18            Plan - 06/14/18 1149    Clinical Impression Statement Pt. reports no pain today. Pt. reports that she moved her left shoulder wrong yesterday at church when she was startled by someone. Pt. reports her left UE hurt yesterday, however has no pain today. Pt. continues to work on improving left UE motor control, [facility] skills, and ROM in order to improve performance with  ADL, and IADL tasks.    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 Pt enjoys singing for leisure however has not been able to do so since experiencing CVA.    Occupational performance deficits (Please refer to evaluation for details):  ADL's;IADL's;Leisure    Rehab Potential  Good    Current Impairments/barriers affecting progress:  Positive: age,  motivate, family suppory; Negative:     OT Frequency  2x / week    OT Duration  12 weeks    OT Treatment/Interventions  Self-care/ADL training;Therapeutic exercise;Neuromuscular education;Therapeutic activities;Manual Therapy;Passive range of motion    Clinical Decision Making  Several treatment options, min-mod task modification necessary    Consulted and Agree with Plan of Care  Patient       Patient will benefit from skilled therapeutic intervention in order to improve the following deficits and impairments:  Pain, Decreased cognition, Decreased range of motion, Decreased strength, Impaired perceived functional ability, Impaired UE functional use  Visit Diagnosis: Muscle weakness (generalized)  Other lack of coordination    Problem List Patient Active Problem List   Diagnosis Date Noted  . Migraine 02/23/2018  . HIV disease ([place])   . Stroke (cerebrum) ([place]) - R basal ganglia d/t small vessel dz 02/18/2018  . HLD (hyperlipidemia) 08/02/2014  . Diabetes mellitus type 2, uncontrolled, with complications ([place]) 90/06/2240  . Ulcerative colitis ([place]) 06/26/2008  . Depression, recurrent ([place]) 04/04/2008    Harrel Carina, MS, OTR/L 06/14/2018, 11:56 AM  [place] MAIN [facility] SERVICES [address] [place], [place], [address] Phone: [phone]   Fax:  [phone]  Name: [name] MRN: [id number] Date of Birth: 1955/07/22

## 2018-06-16 ENCOUNTER — Telehealth: Payer: Self-pay

## 2018-06-16 ENCOUNTER — Ambulatory Visit: Payer: BLUE CROSS/BLUE SHIELD | Admitting: Occupational Therapy

## 2018-06-16 NOTE — Telephone Encounter (Signed)
[facility] faxed a note that last night pt took lorazepam and citalopram at bedtime. Pt was advised to call poison control by [facility] nurse. I spoke with pt and pt did call poison control last night and was advised it was OK to take the Mirtazapine( pt does not have lorazepam; and pt verified she took mirtazapine instead of lorazepam) and citalopram together but it might make pt drowsy. Pt stated she did fine. Pt is going to stop taking citalopram because she thinks it is for the same purpose as the mirtazapine. Pt request cb to let her know if R Baity NP wants her to take both the mirtazapine and citalopram.

## 2018-06-16 NOTE — Telephone Encounter (Signed)
Ok to stop Citalopram. Not working for her anyway. Continue Mirtazapine.

## 2018-06-17 NOTE — Telephone Encounter (Addendum)
Pt notified as instructed and pt voiced understanding.Citalopram discontinued from pt s med list. Pt said she had spoken with [facility] CMA about PA for glipizide; pt has spoken with the pharmacy and pharmacy had pts named spelled Merilyn instead of Deiona. Pt request to resubmit PA for glipizide.

## 2018-06-17 NOTE — Addendum Note (Signed)
Addended by: Helene Shoe on: 06/17/2018 12:12 PM   Modules accepted: Orders

## 2018-06-18 NOTE — Telephone Encounter (Signed)
Spoke to pt and advised for the 3rd time that it is not an issue with the pharmacy, but with her insurance. I have attempted to complete PA 4 times and I keep getting an error msg that pt can not be found... Pt advised to call insurance company not pharmacy... Pt expressed understanding

## 2018-06-21 ENCOUNTER — Encounter: Payer: Self-pay | Admitting: Occupational Therapy

## 2018-06-21 ENCOUNTER — Ambulatory Visit: Payer: BLUE CROSS/BLUE SHIELD | Admitting: Occupational Therapy

## 2018-06-21 DIAGNOSIS — R278 Other lack of coordination: Secondary | ICD-10-CM

## 2018-06-21 DIAGNOSIS — M6281 Muscle weakness (generalized): Secondary | ICD-10-CM | POA: Diagnosis not present

## 2018-06-21 NOTE — Therapy (Signed)
[place] MAIN [facility] SERVICES [address] [place], [place], [address] Phone: [phone]   Fax:  [phone]  Occupational Therapy Treatment  Patient Details  Name: [name] MRN: [id number] Date of Birth: July 03, 1956 No data recorded  Encounter Date: 06/21/2018  OT End of Session - 06/21/18 1122    Visit Number  16    Number of Visits  24    Date for OT Re-Evaluation  06/24/18    Authorization Type  visit 6 of 10 for progress report period starting 05/20/2018    OT Start Time  1057    OT Stop Time  1130    OT Time Calculation (min)  33 min    Activity Tolerance  Patient tolerated treatment well    Behavior During Therapy  [facility] for tasks assessed/performed       Past Medical History:  Diagnosis Date  . Asthma   . Diabetes mellitus without complication ([place]) 09-05-95   hx. NIDDM-dx. 3 weeks ago  . Hyperlipidemia    hx  . Seasonal allergies   . Stroke ([place])   . Ulcerative colitis [facility])     Past Surgical History:  Procedure Laterality Date  . BUNIONECTOMY     bilateral and toe nails of big toes removed  . CESAREAN SECTION  1997/2006  . CHOLECYSTECTOMY N/A 12/01/2012   Procedure: LAPAROSCOPIC CHOLECYSTECTOMY;  Surgeon: Stark Klein, MD;  [facility];  Service: General;  Laterality: N/A;  . COLONOSCOPY W/ POLYPECTOMY    . EYE SURGERY Bilateral 2013  . TONSILLECTOMY      There were no vitals filed for this visit.  Subjective Assessment - 06/21/18 1118    Subjective   Pt. has a CT scan scheduled for tomorrow    Pertinent History  Pt is a 61 y.o. female presenting with LUE weakness and decreased [place] as a result of R basal ganglia CVA on 8/08. Pt was hospitalized for 1 week where she received inpatient therapy and was then discharged home where she received home health therapy for 2 weeks. Prior to CVA, pt was independent for ADLs and IADLs. Currently, pt requires increased assistance, increased time, and/or modified  strategies for ADLs and IADLs.     Patient Stated Goals  Pt wants to be able to do everything with her L hand that her R hand can do, including making wigs, picking up laundry baskets, manipulating coins for shopping, and grooming hair.    Currently in Pain?  No/denies      OT TREATMENT    Neuro muscular re-education:  Pt. performed [facility] tasks using the Grooved pegboard. Pt. worked on grasping the grooved pegs from a horizontal position, and moving the pegs with her fingers to a vertical position in the hand to prepare for placing them in the grooved slot. Pt. Required verbal cues. Encouragement was provided during the task.  Therapeutic Exercise:  Pt. Tolerated shoulder AAROM in painfree range for shoulder flexion using a medium, and large angled wedge.                          OT Education - 06/21/18 1118    Education Details  Left [place] skills, reaching    Person(s) Educated  Patient    Methods  Explanation;Verbal cues;Demonstration    Comprehension  Verbalized understanding;Verbal cues required          OT Long Term Goals - 05/18/18 1655      OT  LONG TERM GOAL #1   Title  Pt will decreased [place] by 15 secs of speed in order to independently manipulate coins during grocery shopping    Baseline  Pt drops coins when checking out at grocery store; [facility] 3 mins 06 secs    Time  12    Period  Weeks    Status  On-going    Target Date  06/24/18      OT LONG TERM GOAL #2   Title  Pt will increase LUE strength by 1 mm level in order to independently pick up laundry baskets during IADLs    Baseline  Pt is unable to pick up laundry baskets; MMT LUE 3+/5    Time  12    Period  Weeks    Status  On-going    Target Date  06/24/18      OT LONG TERM GOAL #3   Title  Pt will increase L shoulder ROM by 10 degrees in efficiently bathe and dress UB    Baseline  Pt is unable to use L hand to bathe and dress efficiently; L AROM shoulder abd 52 degrees    Time  12     Period  Weeks    Status  On-going    Target Date  06/24/18      OT LONG TERM GOAL #4   Title  Pt will increase L lateral pinch by 3# in order to clip nails independently    Baseline  Pt is unable to use L hand to clip nails; L lateral pinch is 3#    Time  12    Period  Weeks    Status  On-going    Target Date  06/24/18      OT LONG TERM GOAL #5   Title  Pt will increase L 3 point pinch by 4# to be able to independently button small buttons    Baseline  Pt requires increased time to button clothes using L hand; L 3 point pinch is 2#    Time  12    Period  Weeks    Status  On-going    Target Date  06/24/18            Plan - 06/21/18 1137    Clinical Impression Statement  Pt. reports that she may have done too much over the weekend with her left arm. Pt. reports wondering if she needs another injection in her left shoulder, and plans to follow-up with her orthopedic Physician. Pt. reports having a CT scan scheduled for tomorrow. Pt. continues to work on improving LUE functioning, motor control, and [facility] skills within painfree range. Plan to recert on the next treatment date.    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 Pt enjoys singing for leisure however has not been able to do so since experiencing CVA.    Occupational performance deficits (Please refer to evaluation for details):  ADL's;IADL's;Leisure    Rehab Potential  Good    Current Impairments/barriers affecting progress:  Positive: age, motivate, family suppory; Negative:     OT Frequency  2x / week    OT Duration  12 weeks    OT Treatment/Interventions  Self-care/ADL training;Therapeutic exercise;Neuromuscular education;Therapeutic activities;Manual Therapy;Passive range of motion    Clinical Decision Making  Several treatment options, min-mod task modification necessary    Consulted and  Agree with Plan of Care  Patient       Patient will benefit from skilled therapeutic intervention in order to improve the following deficits and impairments:  Pain, Decreased cognition, Decreased range of motion, Decreased strength, Impaired perceived functional ability, Impaired UE functional use  Visit Diagnosis: Muscle weakness (generalized)  Other lack of coordination    Problem List Patient Active Problem List   Diagnosis Date Noted  . Migraine 02/23/2018  . HIV disease ([place])   . Stroke (cerebrum) ([place]) - R basal ganglia d/t small vessel dz 02/18/2018  . HLD (hyperlipidemia) 08/02/2014  . Diabetes mellitus type 2, uncontrolled, with complications ([place]) 70/92/9574  . Ulcerative colitis ([place]) 06/26/2008  . Depression, recurrent ([place]) 04/04/2008    Harrel Carina, MS, OTR/L 06/21/2018, 12:28 PM  [place] MAIN [facility] SERVICES [address] [place], [place], [address] Phone: [phone]   Fax:  [phone]  Name: [name] MRN: [id number] Date of Birth: 21-Sep-1955

## 2018-06-22 ENCOUNTER — Ambulatory Visit
Admission: RE | Admit: 2018-06-22 | Discharge: 2018-06-22 | Disposition: A | Discharge status: Home / Self Care | Payer: BLUE CROSS/BLUE SHIELD | Source: Ambulatory Visit | Attending: Orthopaedic Surgery | Admitting: Orthopaedic Surgery

## 2018-06-22 ENCOUNTER — Other Ambulatory Visit: Payer: Self-pay | Admitting: Internal Medicine

## 2018-06-22 DIAGNOSIS — M25532 Pain in left wrist: Secondary | ICD-10-CM | POA: Insufficient documentation

## 2018-06-23 ENCOUNTER — Ambulatory Visit: Payer: BLUE CROSS/BLUE SHIELD | Admitting: Occupational Therapy

## 2018-06-23 ENCOUNTER — Encounter: Payer: Self-pay | Admitting: Occupational Therapy

## 2018-06-23 DIAGNOSIS — M6281 Muscle weakness (generalized): Secondary | ICD-10-CM

## 2018-06-23 DIAGNOSIS — R278 Other lack of coordination: Secondary | ICD-10-CM

## 2018-06-23 NOTE — Therapy (Addendum)
[place] MAIN [facility] SERVICES [address] [place], [place], [address] Phone: [phone]   Fax:  [phone]  Occupational Therapy Treatment/Recertification Note  Patient Details  Name: [name] MRN: [id number] Date of Birth: 1955-09-27 No data recorded  Encounter Date: 06/23/2018  OT End of Session - 06/23/18 1110    Visit Number  17    Number of Visits  24    Date for OT Re-Evaluation  09/15/18    Authorization Type  Visit 7 of 10 for progress report period starting 05/20/2018    OT Start Time  0937    OT Stop Time  1015    OT Time Calculation (min)  38 min    Activity Tolerance  Patient tolerated treatment well    Behavior During Therapy  [facility] for tasks assessed/performed       Past Medical History:  Diagnosis Date  . Asthma   . Diabetes mellitus without complication ([place]) 5-67-01   hx. NIDDM-dx. 3 weeks ago  . Hyperlipidemia    hx  . Seasonal allergies   . Stroke ([place])   . Ulcerative colitis [facility])     Past Surgical History:  Procedure Laterality Date  . BUNIONECTOMY     bilateral and toe nails of big toes removed  . CESAREAN SECTION  1997/2006  . CHOLECYSTECTOMY N/A 12/01/2012   Procedure: LAPAROSCOPIC CHOLECYSTECTOMY;  Surgeon: Stark Klein, MD;  [facility];  Service: General;  Laterality: N/A;  . COLONOSCOPY W/ POLYPECTOMY    . EYE SURGERY Bilateral 2013  . TONSILLECTOMY      There were no vitals filed for this visit.  Subjective Assessment - 06/23/18 1004    Subjective  Pt. reports having had a CT scan yesterday, but it hurt her LUE, and was not able to get her arm into the position they needed.    Pertinent History  Pt is a 62 y.o. female presenting with LUE weakness and decreased [place] as a result of R basal ganglia CVA on 8/08. Pt was hospitalized for 1 week where she received inpatient therapy and was then discharged home where she received home health therapy for 2 weeks. Prior to CVA, pt was  independent for ADLs and IADLs. Currently, pt requires increased assistance, increased time, and/or modified strategies for ADLs and IADLs.     Patient Stated Goals  Pt wants to be able to do everything with her L hand that her R hand can do, including making wigs, picking up laundry baskets, manipulating coins for shopping, and grooming hair.    Currently in Pain?  No/denies      OT TREATMENT    Measurements were obtained, and goals were reviewed with the pt.  Neuro muscular re-education:  Pt. worked on grasping, and manipulating 2" pegs on the [employer] using her LUE. Pt. was able to reach into the container placed at various angles to retrieve the pegs, and place them onto the board following a design pattern. Pt. worked on moving the pegs within her hand as needed. Pt. worked on grasping 2" sticks, and placed them into a small pegboard placed at a vertical angle.        [facility] OT Assessment - 06/23/18 1212      AROM   Overall AROM Comments  Left shoulder flexion in sitting: 78 degrees, abduction 65 painfree range  OT Education - 06/23/18 1109    Education Details  Left [place] skills, reaching    Person(s) Educated  Patient    Methods  Explanation;Verbal cues;Demonstration    Comprehension  Verbalized understanding;Verbal cues required          OT Long Term Goals - 06/23/18 1126      OT LONG TERM GOAL #1   Title  Pt will improve [place] by 10 secs of speed in order to independently manipulate coins during grocery shopping    Baseline  06/23/2018: Left [place]: 43 sec. Pt. continues to have difficulty manipulating small objects, and coins.    Time  12    Period  Weeks    Status  Revised    Target Date  09/15/18      OT LONG TERM GOAL #2   Title Pt. will perform light homemaking tasks with modified independence using energy conservation/work simplification techniques as needed.   Baseline  12/11/209: Pt. continues to have  difficulty   Time  12    Period  Weeks    Status  Revised   Target Date  09/15/18      OT LONG TERM GOAL #3   Title  Pt will perform UE ADL tasks with modified independence    Baseline  06/23/2018: Pt. continues to be limited    Time  12    Period  Weeks    Status  On-going    Target Date  09/15/18      OT LONG TERM GOAL #4   Title  Pt. will perform hair/wig care with modified independence using compensatory strategies.     Baseline  06/23/2018: Pt. has difficulty performing hair/wig care    Time  12    Period  Weeks    Status  Revised    Target Date  09/15/18      OT LONG TERM GOAL #5   Title  Pt. will manipulate small buttons with modified independence.    Baseline  06/23/2018: Pt. Continues to have difficulty manipulating buttons.   Time  12    Period  Weeks    Status  On-going    Target Date  09/15/18            Plan - 06/23/18 1111    Clinical Impression Statement Pt. has made excellent progress with left hand [facility] skills, and has improved speed, and dexterity by 43 sec. on the 9 hole peg test. Pt. continues to present impaired motor control skills, and limited [facility] skills needed to manipulate objects during ADLS, and IADL. Pt. continues to wear her wrist brace. Left shoulder pain has improved,  however is still limiting her ability to complete daily tasks efficiently at home.  Pt. continues to present with LUE weakness, and wrist limitations. Pt. Has difficulty performing hair/wig care,  manipulating coins, and fastening buttons, and performing daily self-care efficiently. Pt. continues to work on improving LUE functioning within her painfree range in order to improve ADL, and IADL functioning.    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 Pt enjoys singing for leisure however has not been able to do so since experiencing CVA.     Occupational performance deficits (Please refer to evaluation for details):  ADL's;IADL's;Leisure    Rehab Potential  Good    Current Impairments/barriers affecting progress:  Positive: age, motivate, family suppory; Negative:     OT  Frequency  2x / week    OT Duration  12 weeks    OT Treatment/Interventions  Self-care/ADL training;Therapeutic exercise;Neuromuscular education;Therapeutic activities;Manual Therapy;Passive range of motion    Clinical Decision Making  Several treatment options, min-mod task modification necessary    Consulted and Agree with Plan of Care  Patient       Patient will benefit from skilled therapeutic intervention in order to improve the following deficits and impairments:  Pain, Decreased cognition, Decreased range of motion, Decreased strength, Impaired perceived functional ability, Impaired UE functional use  Visit Diagnosis: Muscle weakness (generalized) - Plan: Ot plan of care cert/re-cert  Other lack of coordination - Plan: Ot plan of care cert/re-cert    Problem List Patient Active Problem List   Diagnosis Date Noted  . Migraine 02/23/2018  . HIV disease ([place])   . Stroke (cerebrum) ([place]) - R basal ganglia d/t small vessel dz 02/18/2018  . HLD (hyperlipidemia) 08/02/2014  . Diabetes mellitus type 2, uncontrolled, with complications ([place]) 23/95/3202  . Ulcerative colitis ([place]) 06/26/2008  . Depression, recurrent ([place]) 04/04/2008    Harrel Carina, MS, OTR/L 06/23/2018, 12:24 PM  [place] MAIN [facility] SERVICES [address] [place], [place], [address] Phone: [phone]   Fax:  [phone]  Name: [name] MRN: [id number] Date of Birth: May 14, 1956

## 2018-06-23 NOTE — Addendum Note (Signed)
Addended by: Lucia Bitter on: 06/23/2018 12:27 PM   Modules accepted: Orders

## 2018-06-25 ENCOUNTER — Emergency Department
Admission: EM | Admit: 2018-06-25 | Discharge: 2018-06-25 | Disposition: A | Discharge status: Home / Self Care | Payer: BLUE CROSS/BLUE SHIELD | Attending: Emergency Medicine | Admitting: Emergency Medicine

## 2018-06-25 ENCOUNTER — Other Ambulatory Visit: Payer: Self-pay

## 2018-06-25 ENCOUNTER — Encounter: Payer: Self-pay | Admitting: Emergency Medicine

## 2018-06-25 DIAGNOSIS — Z7982 Long term (current) use of aspirin: Secondary | ICD-10-CM | POA: Diagnosis not present

## 2018-06-25 DIAGNOSIS — B20 Human immunodeficiency virus [HIV] disease: Secondary | ICD-10-CM | POA: Insufficient documentation

## 2018-06-25 DIAGNOSIS — Z79899 Other long term (current) drug therapy: Secondary | ICD-10-CM | POA: Diagnosis not present

## 2018-06-25 DIAGNOSIS — Z7984 Long term (current) use of oral hypoglycemic drugs: Secondary | ICD-10-CM | POA: Diagnosis not present

## 2018-06-25 DIAGNOSIS — M25512 Pain in left shoulder: Secondary | ICD-10-CM | POA: Diagnosis not present

## 2018-06-25 DIAGNOSIS — J45909 Unspecified asthma, uncomplicated: Secondary | ICD-10-CM | POA: Insufficient documentation

## 2018-06-25 DIAGNOSIS — E1165 Type 2 diabetes mellitus with hyperglycemia: Secondary | ICD-10-CM | POA: Insufficient documentation

## 2018-06-25 DIAGNOSIS — R739 Hyperglycemia, unspecified: Secondary | ICD-10-CM

## 2018-06-25 LAB — URINALYSIS, COMPLETE (UACMP) WITH MICROSCOPIC
Bacteria, UA: NONE SEEN
Bilirubin Urine: NEGATIVE
Ketones, ur: NEGATIVE mg/dL
Leukocytes, UA: NEGATIVE
Nitrite: NEGATIVE
Protein, ur: NEGATIVE mg/dL
Specific Gravity, Urine: 1.012 (ref 1.005–1.030)
pH: 8 (ref 5.0–8.0)

## 2018-06-25 LAB — CBC
HCT: 37.9 % (ref 36.0–46.0)
Hemoglobin: 12.3 g/dL (ref 12.0–15.0)
MCH: 30.2 pg (ref 26.0–34.0)
MCHC: 32.5 g/dL (ref 30.0–36.0)
MCV: 93.1 fL (ref 80.0–100.0)
PLATELETS: 221 10*3/uL (ref 150–400)
RBC: 4.07 MIL/uL (ref 3.87–5.11)
RDW: 15 % (ref 11.5–15.5)
WBC: 8 10*3/uL (ref 4.0–10.5)
nRBC: 0.3 % — ABNORMAL HIGH (ref 0.0–0.2)

## 2018-06-25 LAB — GLUCOSE, CAPILLARY: Glucose-Capillary: 283 mg/dL — ABNORMAL HIGH (ref 70–99)

## 2018-06-25 LAB — BASIC METABOLIC PANEL
Anion gap: 8 (ref 5–15)
BUN: 16 mg/dL (ref 8–23)
CO2: 20 mmol/L — ABNORMAL LOW (ref 22–32)
CREATININE: 0.93 mg/dL (ref 0.44–1.00)
Calcium: 9.7 mg/dL (ref 8.9–10.3)
Chloride: 107 mmol/L (ref 98–111)
GFR calc Af Amer: >60 mL/min (ref >60)
GFR calc non Af Amer: >60 mL/min (ref >60)
Glucose, Bld: 302 mg/dL — ABNORMAL HIGH (ref 70–99)
Potassium: 4 mmol/L (ref 3.5–5.1)
Sodium: 135 mmol/L (ref 135–145)

## 2018-06-25 NOTE — ED Triage Notes (Signed)
Pt arrives POV to triage with c/o hyperglycemia. Pt states that she received a steroid shot in her left shoulder earlier today and her CBG has been going up. Pt states that her CBG at home was 300 and she doubled her dose to 20 mg of "glypozine" in response. Pt has previous left sided facial droop as a result from a previous stroke. Pt is in NAD.

## 2018-06-25 NOTE — ED Provider Notes (Signed)
[facility] Emergency Department Provider Note   ____________________________________________    I have reviewed the triage vital signs and the nursing notes.   HISTORY  Chief Complaint Hyperglycemia     HPI [name] is a 62 y.o. female who presents with complaints of elevated blood sugar.  Patient reports that she was given a shot of steroids this morning and since then she noticed that her glucose went above 400, she took 20 mg of her diabetic medication which she takes "as needed ".  She reports polyuria but no polydipsia.  No change in vision/blurry vision.  No abdominal pain, did feel briefly nauseated.  Currently feels quite well no complaints   Past Medical History:  Diagnosis Date  . Asthma   . Diabetes mellitus without complication ([place]) 3-76-28   hx. NIDDM-dx. 3 weeks ago  . Hyperlipidemia    hx  . Seasonal allergies   . Stroke ([place])   . Ulcerative colitis [facility])     Patient Active Problem List   Diagnosis Date Noted  . Migraine 02/23/2018  . HIV disease ([place])   . Stroke (cerebrum) ([place]) - R basal ganglia d/t small vessel dz 02/18/2018  . HLD (hyperlipidemia) 08/02/2014  . Diabetes mellitus type 2, uncontrolled, with complications ([place]) 31/51/7616  . Ulcerative colitis ([place]) 06/26/2008  . Depression, recurrent ([place]) 04/04/2008    Past Surgical History:  Procedure Laterality Date  . BUNIONECTOMY     bilateral and toe nails of big toes removed  . CESAREAN SECTION  1997/2006  . CHOLECYSTECTOMY N/A 12/01/2012   Procedure: LAPAROSCOPIC CHOLECYSTECTOMY;  Surgeon: Stark Klein, MD;  Location: [facility] ORS;  Service: General;  Laterality: N/A;  . COLONOSCOPY W/ POLYPECTOMY    . EYE SURGERY Bilateral 2013  . TONSILLECTOMY      Prior to Admission medications   Medication Sig Start Date End Date Taking? Authorizing Provider  acetaminophen (TYLENOL) 325 MG tablet Take 2 tablets (650 mg total) by mouth every 6 (six) hours as needed  for mild pain (or temp > 37.5 C (99.5 F)). 03/02/18   Angiulli, Lavon Paganini, PA-C  albuterol (PROVENTIL HFA;VENTOLIN HFA) 108 (90 Base) MCG/ACT inhaler Inhale 2 puffs into the lungs every 6 (six) hours as needed for wheezing or shortness of breath. 02/26/16   Jearld Fenton, NP  aspirin EC 81 MG EC tablet Take 1 tablet (81 mg total) by mouth daily. 02/24/18   Donzetta Starch, NP  atorvastatin (LIPITOR) 40 MG tablet TAKE 1 TABLET (40 MG TOTAL) BY MOUTH DAILY AT 6 PM. 05/06/18   Baity, Coralie Keens, NP  bictegravir-emtricitabine-tenofovir AF (BIKTARVY) 50-200-25 MG TABS tablet Take 1 tablet by mouth daily. 04/01/18   [place] Callas, NP  diclofenac sodium (VOLTAREN) 1 % GEL Apply 1 application topically 3 (three) times daily. Left knee 03/29/18   Meredith Staggers, MD  glipiZIDE (GLUCOTROL) 10 MG tablet TAKE ONE TABLET BY MOUTH TWICE A DAY BEFORE A MEAL 06/23/18   Baity, Coralie Keens, NP  glucose 4 GM chewable tablet Chew 1 tablet (4 g total) by mouth as needed for low blood sugar. 05/07/18   Ria Bush, MD  glucose blood (FREESTYLE LITE) test strip 1 each by Other route 2 (two) times daily. 03/10/18   Jearld Fenton, NP  methocarbamol (ROBAXIN) 500 MG tablet Take 1 tablet (500 mg total) by mouth 2 (two) times daily as needed for muscle spasms (sedation precaution). 05/07/18   Ria Bush, MD  mirtazapine (REMERON) 7.5  MG tablet Take 1 tablet (7.5 mg total) by mouth at bedtime. 06/12/18   Jearld Fenton, NP  potassium chloride SA (K-DUR,KLOR-CON) 20 MEQ tablet Take 1 tablet (20 mEq total) by mouth daily. 05/06/18   Jearld Fenton, NP  topiramate (TOPAMAX) 100 MG tablet TAKE ONE TABLET BY MOUTH AT BEDTIME AS NEEDED 06/01/18   Jearld Fenton, NP  topiramate (TOPAMAX) 50 MG tablet Take 1 tablet (50 mg total) by mouth at bedtime. 03/29/18   Meredith Staggers, MD     Allergies Azithromycin; Erythromycin; Penicillins; and Tetracycline  Family History  Problem Relation Age of Onset  . Hypertension  Mother   . Diabetes Mother   . Cancer Father        lung ca  . Seizures Daughter        29yr    Social History Social History   Tobacco Use  . Smoking status: Never Smoker  . Smokeless tobacco: Never Used  Substance Use Topics  . Alcohol use: Not Currently    Alcohol/week: 0.0 standard drinks    Comment: occasional  . Drug use: No    Review of Systems  Constitutional: No fever/chills Eyes: No visual changes.  ENT: No sore throat. Cardiovascular: Denies chest pain. Respiratory: Denies shortness of breath. Gastrointestinal: No abdominal pain Genitourinary: Negative for dysuria. Musculoskeletal: Negative for back pain. Skin: Negative for rash. Neurological: Negative for headaches   ____________________________________________   PHYSICAL EXAM:  VITAL SIGNS: ED Triage Vitals [06/25/18 2011]  Enc Vitals Group     BP (!) 163/95     Pulse Rate 94     Resp 18     Temp 97.9 F (36.6 C)     Temp Source Oral     SpO2 97 %     Weight 61.2 kg (135 lb)     Height 1.575 m (5' 2" )     Head Circumference      Peak Flow      Pain Score 0     Pain Loc      Pain Edu?      Excl. in [place]     Constitutional: Alert and oriented. No acute distress. Pleasant and interactive    Mouth/Throat: Mucous membranes are moist.    Cardiovascular: Normal rate, regular rhythm. Grossly normal heart sounds.  Good peripheral circulation. Respiratory: Normal respiratory effort.  No retractions. Lungs CTAB. Gastrointestinal: Soft and nontender. No distention.  Genitourinary: deferred Musculoskeletal:  Warm and well perfused Neurologic:  Normal speech and language.  Skin:  Skin is warm, dry and intact. No rash noted. Psychiatric: Mood and affect are normal. Speech and behavior are normal.  ____________________________________________   LABS (all labs ordered are listed, but only abnormal results are displayed)  Labs Reviewed  BASIC METABOLIC PANEL - Abnormal; Notable for the  following components:      Result Value   CO2 20 (*)    Glucose, Bld 302 (*)    All other components within normal limits  CBC - Abnormal; Notable for the following components:   nRBC 0.3 (*)    All other components within normal limits  URINALYSIS, COMPLETE (UACMP) WITH MICROSCOPIC - Abnormal; Notable for the following components:   Color, Urine STRAW (*)    APPearance CLEAR (*)    Glucose, UA >=500 (*)    Hgb urine dipstick SMALL (*)    All other components within normal limits  GLUCOSE, CAPILLARY - Abnormal; Notable for the following components:   Glucose-Capillary 283 (*)  All other components within normal limits  CBG MONITORING, ED   ____________________________________________  EKG   ____________________________________________  RADIOLOGY  None ____________________________________________   PROCEDURES  Procedure(s) performed: No  Procedures   Critical Care performed: No ____________________________________________   INITIAL IMPRESSION / ASSESSMENT AND PLAN / ED COURSE  Pertinent labs & imaging results that were available during my care of the patient were reviewed by me and considered in my medical decision making (see chart for details).  Patient well-appearing and in no acute distress.  Glucose is already trending back down.  I advised the patient that she should take her diabetic medication daily, recommend follow-up with PCP.    ____________________________________________   FINAL CLINICAL IMPRESSION(S) / ED DIAGNOSES  Final diagnoses:  Hyperglycemia        Note:  This document was prepared using Dragon voice recognition software and may include unintentional dictation errors.    Lavonia Drafts, MD 06/25/18 2129

## 2018-06-28 ENCOUNTER — Ambulatory Visit: Payer: BLUE CROSS/BLUE SHIELD | Admitting: Occupational Therapy

## 2018-06-28 ENCOUNTER — Telehealth: Payer: Self-pay

## 2018-06-28 ENCOUNTER — Encounter: Payer: Self-pay | Admitting: Occupational Therapy

## 2018-06-28 DIAGNOSIS — M6281 Muscle weakness (generalized): Secondary | ICD-10-CM

## 2018-06-28 DIAGNOSIS — R278 Other lack of coordination: Secondary | ICD-10-CM

## 2018-06-28 NOTE — Addendum Note (Signed)
Addended by: Lucia Bitter on: 06/28/2018 08:25 AM   Modules accepted: Orders

## 2018-06-28 NOTE — Therapy (Signed)
[place] MAIN [facility] SERVICES [address] [place], [place], [address] Phone: [phone]   Fax:  [phone]  Occupational Therapy Treatment  Patient Details  Name: [name] MRN: [id number] Date of Birth: Nov 17, 1955 No data recorded  Encounter Date: 06/28/2018  OT End of Session - 06/28/18 1250    Visit Number  18    Number of Visits  24    Date for OT Re-Evaluation  09/15/18    Authorization Type  Visit 8 of 10 for progress report period starting 05/20/2018    OT Start Time  0955    OT Stop Time  1015    OT Time Calculation (min)  20 min    Activity Tolerance  Patient tolerated treatment well    Behavior During Therapy  [facility] for tasks assessed/performed       Past Medical History:  Diagnosis Date  . Asthma   . Diabetes mellitus without complication ([place]) 9-83-38   hx. NIDDM-dx. 3 weeks ago  . Hyperlipidemia    hx  . Seasonal allergies   . Stroke ([place])   . Ulcerative colitis [facility])     Past Surgical History:  Procedure Laterality Date  . BUNIONECTOMY     bilateral and toe nails of big toes removed  . CESAREAN SECTION  1997/2006  . CHOLECYSTECTOMY N/A 12/01/2012   Procedure: LAPAROSCOPIC CHOLECYSTECTOMY;  Surgeon: Stark Klein, MD;  Location: [facility] ORS;  Service: General;  Laterality: N/A;  . COLONOSCOPY W/ POLYPECTOMY    . EYE SURGERY Bilateral 2013  . TONSILLECTOMY      There were no vitals filed for this visit.  Subjective Assessment - 06/28/18 1248    Subjective   Pt. reports having limited support at home, and reports she has a new attitude.    Patient Stated Goals  Pt wants to be able to do everything with her L hand that her R hand can do, including making wigs, picking up laundry baskets, manipulating coins for shopping, and grooming hair.    Currently in Pain?  No/denies       OT TREATMENT    Therapeutic Exercise:  Pt. Worked on shoulder AAROM for shoulder while seated at the tabletop using a wedge with  various incline angles. Pt. was able to tolerate medium, and large without difficulty. No reports of pain. Pt. worked on reaching in multiple planes, and angles to place yellow light resistive clips onto a dowel. AROM shoulder flexion: 107, abduction 95 painfree                       OT Education - 06/28/18 1249    Education Details  Reaching    Person(s) Educated  Patient    Methods  Explanation;Verbal cues;Demonstration    Comprehension  Verbalized understanding;Verbal cues required          OT Long Term Goals - 06/23/18 1126      OT LONG TERM GOAL #1   Title  Pt will improve [place] by 10 secs of speed in order to independently manipulate coins during grocery shopping    Baseline  06/23/2018: Left [place]: 43 sec. Pt. continues to have difficulty manipulating objects, and coins.    Time  12    Period  Weeks    Status  Revised    Target Date  09/15/18      OT LONG TERM GOAL #2   Title  Pt. will perform light homemaking tasks with modified independence using  energy conservation/work simplification techniques as needed.    Baseline  06/23/2018: Pt. continues to have difficulty with home management tasks.    Time  12    Period  Weeks    Status  Deferred    Target Date  09/15/18      OT LONG TERM GOAL #3   Title  Pt will perform UE ADL tasks with modified independence    Baseline  06/23/2018: Pt. continues to be limited    Time  12    Period  Weeks    Status  On-going    Target Date  09/15/18      OT LONG TERM GOAL #4   Title  Pt. will perform hair/wig care with modified indpendence using compensatory strategies.     Baseline  06/23/2018: Pt. has difficulty performing hair/wig care    Time  12    Period  Weeks    Status  Revised    Target Date  09/15/18      OT LONG TERM GOAL #5   Title  Pt. will manipulate small buttons with modified independence.    Baseline  06/24/2019: Pt requires increased time to button clothes using L hand; L 3 point pinch is 2#     Time  12    Period  Weeks    Status  On-going    Target Date  09/15/18            Plan - 06/28/18 1251    Clinical Impression Statement  Pt. had an orthopedic follow up appointment at the end of last week for her left shoulder. Pt. reports having had another injection. Pt. presents with less pain today, and increased ROM. in her left shoulder. Pt. continues to present with LUE weakness, imprired motor control, and coordination skills.  Pt. continues to work on improving LUE functioning for improved engagement in ADL, and IADL tasks.    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 Pt enjoys singing for leisure however has not been able to do so since experiencing CVA.    Occupational performance deficits (Please refer to evaluation for details):  ADL's;IADL's;Leisure    Rehab Potential  Good    Current Impairments/barriers affecting progress:  Positive: age, motivate, family suppory; Negative:     OT Frequency  2x / week    OT Duration  12 weeks    OT Treatment/Interventions  Self-care/ADL training;Therapeutic exercise;Neuromuscular education;Therapeutic activities;Manual Therapy;Passive range of motion    Clinical Decision Making  Several treatment options, min-mod task modification necessary    Consulted and Agree with Plan of Care  Patient       Patient will benefit from skilled therapeutic intervention in order to improve the following deficits and impairments:  Pain, Decreased cognition, Decreased range of motion, Decreased strength, Impaired perceived functional ability, Impaired UE functional use  Visit Diagnosis: Muscle weakness (generalized)  Other lack of coordination    Problem List Patient Active Problem List   Diagnosis Date Noted  . Migraine 02/23/2018  . HIV disease ([place])   . Stroke (cerebrum) ([place]) - R basal ganglia d/t small  vessel dz 02/18/2018  . HLD (hyperlipidemia) 08/02/2014  . Diabetes mellitus type 2, uncontrolled, with complications ([place]) 56/38/7564  . Ulcerative colitis ([place]) 06/26/2008  . Depression, recurrent ([place]) 04/04/2008    Harrel Carina 06/28/2018, 1:00 PM  [facility]  [place], [place], [address] Phone: [phone]   Fax:  [phone]  Name: [name] MRN: [id number] Date of Birth: June 23, 1956

## 2018-06-28 NOTE — Telephone Encounter (Signed)
[facility] faxed note that pt was seen at [facility] ED on 06/25/18. I spoke with pt and she scheduled 16' ED FU on 06/29/18 at 2:45. When asked how BS was running pt said BS still going up and down.FYI to Avie Echevaria NP. Copy of TH note in R Baity NP in box.

## 2018-06-28 NOTE — Telephone Encounter (Signed)
Noted, will discuss at hospital follow up.

## 2018-06-29 ENCOUNTER — Ambulatory Visit (INDEPENDENT_AMBULATORY_CARE_PROVIDER_SITE_OTHER): Payer: BLUE CROSS/BLUE SHIELD | Admitting: Internal Medicine

## 2018-06-29 ENCOUNTER — Encounter: Payer: Self-pay | Admitting: Internal Medicine

## 2018-06-29 VITALS — BP 124/84 | HR 72 | Temp 97.9°F | Wt 138.0 lb

## 2018-06-29 DIAGNOSIS — E1165 Type 2 diabetes mellitus with hyperglycemia: Secondary | ICD-10-CM | POA: Diagnosis not present

## 2018-06-29 NOTE — Progress Notes (Signed)
Subjective:    Patient ID: [name], female    DOB: [birth year], 62 y.o.   MRN: [id number]  HPI  Pt presents to the clinic today for ER follow up . She went to the ER 12/13 with c/o hyperglycemia. She reports she had received a steroid shot by ortho earlier that morning. She had been checking her sugar at home, it was over 400. She reports she took 2 Glipizide, which she only takes as needed. Her last A1C was 6.6%, 05/2018. They advised her to take her Glipizide as prescribed, not as needed, and follow up with her PCP. Since discharge, she reports she is taking Glipizide 10 mg BID. Her sugars range 78-398.   Review of Systems      Past Medical History:  Diagnosis Date  . Asthma   . Diabetes mellitus without complication ([place]) 0-17-49   hx. NIDDM-dx. 3 weeks ago  . Hyperlipidemia    hx  . Seasonal allergies   . Stroke ([place])   . Ulcerative colitis ([place])     Current Outpatient Medications  Medication Sig Dispense Refill  . acetaminophen (TYLENOL) 325 MG tablet Take 2 tablets (650 mg total) by mouth every 6 (six) hours as needed for mild pain (or temp > 37.5 C (99.5 F)).    Marland Kitchen albuterol (PROVENTIL HFA;VENTOLIN HFA) 108 (90 Base) MCG/ACT inhaler Inhale 2 puffs into the lungs every 6 (six) hours as needed for wheezing or shortness of breath. 1 Inhaler 0  . aspirin EC 81 MG EC tablet Take 1 tablet (81 mg total) by mouth daily.    Marland Kitchen atorvastatin (LIPITOR) 40 MG tablet TAKE 1 TABLET (40 MG TOTAL) BY MOUTH DAILY AT 6 PM. 30 tablet 5  . bictegravir-emtricitabine-tenofovir AF (BIKTARVY) 50-200-25 MG TABS tablet Take 1 tablet by mouth daily. 30 tablet 5  . diclofenac sodium (VOLTAREN) 1 % GEL Apply 1 application topically 3 (three) times daily. Left knee 3 Tube 4  . glipiZIDE (GLUCOTROL) 10 MG tablet TAKE ONE TABLET BY MOUTH TWICE A DAY BEFORE A MEAL 60 tablet 1  . glucose 4 GM chewable tablet Chew 1 tablet (4 g total) by mouth as needed for low blood sugar. 30 tablet 3  . glucose  blood (FREESTYLE LITE) test strip 1 each by Other route 2 (two) times daily. 100 each 5  . methocarbamol (ROBAXIN) 500 MG tablet Take 1 tablet (500 mg total) by mouth 2 (two) times daily as needed for muscle spasms (sedation precaution). 30 tablet 0  . mirtazapine (REMERON) 7.5 MG tablet Take 1 tablet (7.5 mg total) by mouth at bedtime. 30 tablet 0  . potassium chloride SA (K-DUR,KLOR-CON) 20 MEQ tablet Take 1 tablet (20 mEq total) by mouth daily. 30 tablet 5  . topiramate (TOPAMAX) 100 MG tablet TAKE ONE TABLET BY MOUTH AT BEDTIME AS NEEDED 30 tablet 2  . topiramate (TOPAMAX) 50 MG tablet Take 1 tablet (50 mg total) by mouth at bedtime. 30 tablet 2   No current facility-administered medications for this visit.     Allergies  Allergen Reactions  . Azithromycin Itching    Muscle aches  . Erythromycin Nausea And Vomiting  . Penicillins Nausea And Vomiting    Has patient had a PCN reaction causing immediate rash, facial/tongue/throat swelling, SOB or lightheadedness with hypotension: Yes Has patient had a PCN reaction causing severe rash involving mucus membranes or skin necrosis: No Has patient had a PCN reaction that required hospitalization No Has patient had a PCN reaction occurring  within the last 10 years: No If all of the above answers are "NO", then may proceed with Cephalosporin use.   . Tetracycline Nausea And Vomiting    Family History  Problem Relation Age of Onset  . Hypertension Mother   . Diabetes Mother   . Cancer Father        lung ca  . Seizures Daughter        52yr    Social History   Socioeconomic History  . Marital status: Legally Separated    Spouse name: Not on file  . Number of children: Not on file  . Years of education: Not on file  . Highest education level: Not on file  Occupational History  . Not on file  Social Needs  . Financial resource strain: Not on file  . Food insecurity:    Worry: Not on file    Inability: Not on file  .  Transportation needs:    Medical: Not on file    Non-medical: Not on file  Tobacco Use  . Smoking status: Never Smoker  . Smokeless tobacco: Never Used  Substance and Sexual Activity  . Alcohol use: Not Currently    Alcohol/week: 0.0 standard drinks    Comment: occasional  . Drug use: No  . Sexual activity: Not Currently    Partners: Male    Comment: offered condoms  Lifestyle  . Physical activity:    Days per week: Not on file    Minutes per session: Not on file  . Stress: Not on file  Relationships  . Social connections:    Talks on phone: Not on file    Gets together: Not on file    Attends religious service: Not on file    Active member of club or organization: Not on file    Attends meetings of clubs or organizations: Not on file    Relationship status: Not on file  . Intimate partner violence:    Fear of current or ex partner: Not on file    Emotionally abused: Not on file    Physically abused: Not on file    Forced sexual activity: Not on file  Other Topics Concern  . Not on file  Social History Narrative  . Not on file     Constitutional: Denies fever, malaise, fatigue, headache or abrupt weight changes.  Respiratory: Denies difficulty breathing, shortness of breath, cough or sputum production.   Cardiovascular: Denies chest pain, chest tightness, palpitations or swelling in the hands or feet.  Skin: Denies redness, rashes, lesions or ulcercations.    No other specific complaints in a complete review of systems (except as listed in HPI above).  Objective:   Physical Exam   BP 124/84   Pulse 72   Temp 97.9 F (36.6 C) (Oral)   Wt 138 lb (62.6 kg)   BMI 25.24 kg/m  Wt Readings from Last 3 Encounters:  06/29/18 138 lb (62.6 kg)  06/25/18 135 lb (61.2 kg)  06/08/18 132 lb (59.9 kg)    General: Appears her stated age, well developed, well nourished in NAD. Skin: Warm, dry and intact. No ulcerations noted. Cardiovascular: Normal rate and rhythm.    Pulmonary/Chest: Normal effort and positive vesicular breath sounds. No respiratory distress. No wheezes, rales or ronchi noted.  Neurological: Alert and oriented.   BMET    Component Value Date/Time   NA 135 06/25/2018 2015   NA 138 06/23/2014 0503   K 4.0 06/25/2018 2015   K  4.2 06/23/2014 0503   CL 107 06/25/2018 2015   CL 104 06/23/2014 0503   CO2 20 (L) 06/25/2018 2015   CO2 27 06/23/2014 0503   GLUCOSE 302 (H) 06/25/2018 2015   GLUCOSE 246 (H) 06/23/2014 0503   BUN 16 06/25/2018 2015   BUN 17 06/23/2014 0503   CREATININE 0.93 06/25/2018 2015   CREATININE 0.86 06/23/2014 0503   CALCIUM 9.7 06/25/2018 2015   CALCIUM 8.7 06/23/2014 0503   GFRNONAA >60 06/25/2018 2015   GFRNONAA >60 06/23/2014 0503   GFRAA >60 06/25/2018 2015   GFRAA >60 06/23/2014 0503    Lipid Panel     Component Value Date/Time   CHOL 123 06/08/2018 1606   CHOL 211 (H) 06/22/2014 1432   TRIG 154.0 (H) 06/08/2018 1606   TRIG 473 (H) 06/22/2014 1432   HDL 41.70 06/08/2018 1606   HDL 42 06/22/2014 1432   CHOLHDL 3 06/08/2018 1606   VLDL 30.8 06/08/2018 1606   VLDL SEE COMMENT 06/22/2014 1432   LDLCALC 51 06/08/2018 1606   LDLCALC SEE COMMENT 06/22/2014 1432    CBC    Component Value Date/Time   WBC 8.0 06/25/2018 2015   RBC 4.07 06/25/2018 2015   HGB 12.3 06/25/2018 2015   HGB 12.2 06/23/2014 0503   HCT 37.9 06/25/2018 2015   HCT 37.2 06/23/2014 0503   PLT 221 06/25/2018 2015   PLT 251 06/23/2014 0503   MCV 93.1 06/25/2018 2015   MCV 89 06/23/2014 0503   MCH 30.2 06/25/2018 2015   MCHC 32.5 06/25/2018 2015   RDW 15.0 06/25/2018 2015   RDW 13.6 06/23/2014 0503   LYMPHSABS 1.8 02/23/2018 2112   LYMPHSABS 1.8 06/23/2014 0503   MONOABS 0.7 02/23/2018 2112   MONOABS 0.4 06/23/2014 0503   EOSABS 0.0 02/23/2018 2112   EOSABS 0.0 06/23/2014 0503   BASOSABS 0.0 02/23/2018 2112   BASOSABS 0.0 06/23/2014 0503    Hgb A1C Lab Results  Component Value Date   HGBA1C 6.6 (H) 06/08/2018            Assessment & Plan:   ER Follow Up for DM 2  With Hyperglycemia:  ER notes and labs reviewed Continue Glipizide 10 mg BID Monitor sugars, notify me if < 70 or > 200 Encouraged low carb diet and exercise  RTC in 2 months for follow up A1C Webb Silversmith, NP

## 2018-06-29 NOTE — Patient Instructions (Signed)

## 2018-06-30 ENCOUNTER — Ambulatory Visit: Payer: BLUE CROSS/BLUE SHIELD | Admitting: Occupational Therapy

## 2018-07-01 ENCOUNTER — Ambulatory Visit (INDEPENDENT_AMBULATORY_CARE_PROVIDER_SITE_OTHER): Payer: BLUE CROSS/BLUE SHIELD | Admitting: Behavioral Health

## 2018-07-01 DIAGNOSIS — Z23 Encounter for immunization: Secondary | ICD-10-CM | POA: Diagnosis not present

## 2018-07-01 DIAGNOSIS — B20 Human immunodeficiency virus [HIV] disease: Secondary | ICD-10-CM | POA: Diagnosis not present

## 2018-07-05 ENCOUNTER — Ambulatory Visit: Payer: BLUE CROSS/BLUE SHIELD | Admitting: Occupational Therapy

## 2018-07-06 ENCOUNTER — Telehealth: Payer: Self-pay | Admitting: Internal Medicine

## 2018-07-06 DIAGNOSIS — E1165 Type 2 diabetes mellitus with hyperglycemia: Secondary | ICD-10-CM

## 2018-07-06 MED ORDER — GLUCOSE BLOOD VI STRP: 1.0000 | ORAL_STRIP | Freq: Two times a day (BID) | 2 refills | Status: DC

## 2018-07-06 MED ORDER — TRUE METRIX METER W/DEVICE KIT: 1.0000 | PACK | Freq: Once | 0 refills | Status: AC

## 2018-07-06 NOTE — Telephone Encounter (Signed)
Rx sent through [facility] as requested

## 2018-07-06 NOTE — Telephone Encounter (Signed)
Pt called office stating she has a new glucose monitor. It is Metrix brand and is requesting a prescription for the test strips to be sent to [facility] in [place]. Pt stated she is out of test strips.

## 2018-07-08 ENCOUNTER — Ambulatory Visit: Payer: BLUE CROSS/BLUE SHIELD | Admitting: Occupational Therapy

## 2018-07-12 ENCOUNTER — Ambulatory Visit: Payer: BLUE CROSS/BLUE SHIELD | Admitting: Occupational Therapy

## 2018-07-13 ENCOUNTER — Ambulatory Visit: Payer: BLUE CROSS/BLUE SHIELD | Admitting: Occupational Therapy

## 2018-07-13 DIAGNOSIS — M6281 Muscle weakness (generalized): Secondary | ICD-10-CM

## 2018-07-13 DIAGNOSIS — R278 Other lack of coordination: Secondary | ICD-10-CM

## 2018-07-13 NOTE — Therapy (Signed)
[place] MAIN [facility] SERVICES [address] [place], [place], [address] Phone: [phone]   Fax:  [phone]  Occupational Therapy Treatment  Patient Details  Name: [name] MRN: [id number] Date of Birth: Sep 08, 1955 No data recorded  Encounter Date: 07/13/2018  OT End of Session - 07/13/18 1645    Visit Number  19    Number of Visits  24    Date for OT Re-Evaluation  09/15/18    Authorization Type  Visit 9 of 10 for progress report period starting 05/20/2018    OT Start Time  1028    OT Stop Time  1100    OT Time Calculation (min)  32 min    Activity Tolerance  Patient tolerated treatment well    Behavior During Therapy  [facility] for tasks assessed/performed       Past Medical History:  Diagnosis Date  . Asthma   . Diabetes mellitus without complication ([place]) 0-88-11   hx. NIDDM-dx. 3 weeks ago  . Hyperlipidemia    hx  . Seasonal allergies   . Stroke ([place])   . Ulcerative colitis [facility])     Past Surgical History:  Procedure Laterality Date  . BUNIONECTOMY     bilateral and toe nails of big toes removed  . CESAREAN SECTION  1997/2006  . CHOLECYSTECTOMY N/A 12/01/2012   Procedure: LAPAROSCOPIC CHOLECYSTECTOMY;  Surgeon: Stark Klein, MD;  Location: [facility];  Service: General;  Laterality: N/A;  . COLONOSCOPY W/ POLYPECTOMY    . EYE SURGERY Bilateral 2013  . TONSILLECTOMY      There were no vitals filed for this visit.  Subjective Assessment - 07/13/18 1640    Subjective   Pt. reports having had a nice Christmas,a nd plans to sing at church for [employer].    Pertinent History  Pt is a 62 y.o. female presenting with LUE weakness and decreased [place] as a result of R basal ganglia CVA on 8/08. Pt was hospitalized for 1 week where she received inpatient therapy and was then discharged home where she received home health therapy for 2 weeks. Prior to CVA, pt was independent for ADLs and IADLs. Currently, pt requires increased  assistance, increased time, and/or modified strategies for ADLs and IADLs.     Currently in Pain?  Yes    Pain Score  7    Pain fluctuates from No pain to 7/10. Pt. reports her pain is typically 10/10.   Pain Location  Shoulder    Pain Orientation  Left    Pain Descriptors / Indicators  Aching      OT TREATMENT    Neuro muscular re-education:  Pt. worked on reaching tasks with her RUE grasping, and manipulating turning, and flipping [place] style discs prior to reaching. Pt. Worked on reaching to place the discs in a container at multiple angles. Pt. Reached with scaption. Pt. Worked on grasping one inch resistive cubes alternating thumb opposition to the tip of the 2nd digit. The board was positioned at a vertical angle to encourage shoulder elevation. Pt. Worked on pressing them back into place while isolating her 2nd with the board and a flat surface at the tabletop.  Therapeutic Exercise:  Pt. worked on [employer] at a flat tabletop surface, followed by on an incline angle using a medium, and large wedge incline surface. AROM for shoulder flexion 47 degrees, at the beginning of the session improved to 87 degrees at the end.  OT Education - 07/13/18 1645    Education Details  Reaching    Person(s) Educated  Patient    Methods  Explanation;Verbal cues;Demonstration    Comprehension  Verbalized understanding;Verbal cues required          OT Long Term Goals - 06/23/18 1126      OT LONG TERM GOAL #1   Title  Pt will improve [place] by 10 secs of speed in order to independently manipulate coins during grocery shopping    Baseline  06/23/2018: Left [place]: 43 sec. Pt. continues to have difficulty manipulating objects, and coins.    Time  12    Period  Weeks    Status  Revised    Target Date  09/15/18      OT LONG TERM GOAL #2   Title  Pt. will perform light homemaking tasks with modified independence using energy conservation/work simplification  techniques as needed.    Baseline  06/23/2018: Pt. continues to have difficulty with home management tasks.    Time  12    Period  Weeks    Status  Deferred    Target Date  09/15/18      OT LONG TERM GOAL #3   Title  Pt will perform UE ADL tasks with modified independence    Baseline  06/23/2018: Pt. continues to be limited    Time  12    Period  Weeks    Status  On-going    Target Date  09/15/18      OT LONG TERM GOAL #4   Title  Pt. will perform hair/wig care with modified indpendence using compensatory strategies.     Baseline  06/23/2018: Pt. has difficulty performing hair/wig care    Time  12    Period  Weeks    Status  Revised    Target Date  09/15/18      OT LONG TERM GOAL #5   Title  Pt. will manipulate small buttons with modified independence.    Baseline  06/24/2019: Pt requires increased time to button clothes using L hand; L 3 point pinch is 2#    Time  12    Period  Weeks    Status  On-going    Target Date  09/15/18            Plan - 07/13/18 1646    Clinical Impression Statement  Pt. reports she her Primary Care Physician does not her to have anymore injections in her shoulder secondary to her blood sugar levels. Pain continues to limit ROM. Pt. tolerated session well today. Pt. reports that she is trying to engage her RUE during ADL/IADL tasks at home. Pt. 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 Pt enjoys singing for leisure however has not been able to do so since experiencing CVA.    Occupational performance deficits (Please refer to evaluation for details):  ADL's;IADL's;Leisure    Rehab Potential  Good    Current Impairments/barriers affecting progress:  Positive: age, motivate, family  suppory; Negative:     OT Frequency  2x / week    OT Duration  12 weeks    OT Treatment/Interventions  Self-care/ADL training;Therapeutic exercise;Neuromuscular education;Therapeutic activities;Manual Therapy;Passive range of motion    Clinical Decision  Making  Several treatment options, min-mod task modification necessary    Consulted and Agree with Plan of Care  Patient       Patient will benefit from skilled therapeutic intervention in order to improve the following deficits and impairments:  Pain, Decreased cognition, Decreased range of motion, Decreased strength, Impaired perceived functional ability, Impaired UE functional use  Visit Diagnosis: Muscle weakness (generalized)  Other lack of coordination    Problem List Patient Active Problem List   Diagnosis Date Noted  . Migraine 02/23/2018  . HIV disease ([place])   . Stroke (cerebrum) ([place]) - R basal ganglia d/t small vessel dz 02/18/2018  . HLD (hyperlipidemia) 08/02/2014  . Diabetes mellitus type 2, uncontrolled, with complications ([place]) 71/58/0638  . Ulcerative colitis ([place]) 06/26/2008  . Depression, recurrent ([place]) 04/04/2008    Harrel Carina, MS, OTR/L 07/13/2018, 4:59 PM  [place] MAIN [facility] SERVICES [address] [place], [place], [address] Phone: [phone]   Fax:  [phone]  Name: Dartha Rozzell MRN: [id number] Date of Birth: Dec 03, 1955

## 2018-07-14 ENCOUNTER — Other Ambulatory Visit: Payer: Self-pay | Admitting: Internal Medicine

## 2018-07-15 ENCOUNTER — Encounter: Payer: Self-pay | Admitting: Occupational Therapy

## 2018-07-15 ENCOUNTER — Ambulatory Visit: Payer: BLUE CROSS/BLUE SHIELD | Attending: Physical Medicine & Rehabilitation | Admitting: Occupational Therapy

## 2018-07-15 DIAGNOSIS — R278 Other lack of coordination: Secondary | ICD-10-CM | POA: Insufficient documentation

## 2018-07-15 DIAGNOSIS — M6281 Muscle weakness (generalized): Secondary | ICD-10-CM | POA: Diagnosis not present

## 2018-07-15 NOTE — Therapy (Signed)
[place] MAIN [facility] SERVICES [address] [place], [place], [address] Phone: [phone]   Fax:  [phone]  Occupational Therapy Progress Note  Dates of reporting period  05/20/2018  to   07/15/2018  Patient Details  Name: [name] MRN: [id number] Date of Birth: 08/30/1955 No data recorded  Encounter Date: 07/15/2018  OT End of Session - 07/15/18 1237    Visit Number  20    Number of Visits  24    Date for OT Re-Evaluation  09/15/18    Authorization Type  Visit 10 of 10 for progress report period starting 05/20/2018    OT Start Time  1028    OT Stop Time  1100    OT Time Calculation (min)  32 min    Activity Tolerance  Patient tolerated treatment well    Behavior During Therapy  [facility] for tasks assessed/performed       Past Medical History:  Diagnosis Date  . Asthma   . Diabetes mellitus without complication ([place]) 4-62-70   hx. NIDDM-dx. 3 weeks ago  . Hyperlipidemia    hx  . Seasonal allergies   . Stroke ([place])   . Ulcerative colitis [facility])     Past Surgical History:  Procedure Laterality Date  . BUNIONECTOMY     bilateral and toe nails of big toes removed  . CESAREAN SECTION  1997/2006  . CHOLECYSTECTOMY N/A 12/01/2012   Procedure: LAPAROSCOPIC CHOLECYSTECTOMY;  Surgeon: Stark Klein, MD;  Location[facility];  Service: General;  Laterality: N/A;  . COLONOSCOPY W/ POLYPECTOMY    . EYE SURGERY Bilateral 2013  . TONSILLECTOMY      There were no vitals filed for this visit.  Subjective Assessment - 07/15/18 1236    Subjective   Pt. reports singing in 2 performances on New Years Eve.    Pertinent History  Pt is a 63 y.o. female presenting with LUE weakness and decreased [place] as a result of R basal ganglia CVA on 8/08. Pt was hospitalized for 1 week where she received inpatient therapy and was then discharged home where she received home health therapy for 2 weeks. Prior to CVA, pt was independent for ADLs and IADLs.  Currently, pt requires increased assistance, increased time, and/or modified strategies for ADLs and IADLs.     Patient Stated Goals  Pt wants to be able to do everything with her L hand that her R hand can do, including making wigs, picking up laundry baskets, manipulating coins for shopping, and grooming hair.    Currently in Pain?  Yes    Pain Score  5     Pain Location  Shoulder    Pain Orientation  Left    Pain Descriptors / Indicators  Aching       OT TREATMENT    Neuro muscular re-education:  Pt. Worked on reaching with her LUE using the shape tower grasping the shapes with,a nd moving them through the 1st two rungs. Pt. worked on left [facility] skills grasping 1/2" small magnetic pegs, disconnecting them, and placing them on a positioned at a vertical angle at a tabletop, on an elevated angle.Pt. Worked on removing the pegs while alternating thumb opposition to the tip of the 2nd through 5th digits.   There. Ex:  Pt. Worked on AAROM with the LUE using medium, and large incline wedges.  OT Education - 07/15/18 1237    Education Details  Reaching    Person(s) Educated  Patient    Methods  Explanation;Verbal cues;Demonstration    Comprehension  Verbalized understanding;Verbal cues required          OT Long Term Goals - 07/15/18 1247      OT LONG TERM GOAL #1   Title  Pt will improve [place] by 10 secs of speed in order to independently manipulate coins during grocery shopping    Baseline  07/15/2018: [facility] skills are improving. Pt. continues to have difficulty manipulating objects, and coins.    Time  12    Period  Weeks    Status  On-going    Target Date  09/15/18      OT LONG TERM GOAL #2   Title  Pt. will perform light homemaking tasks with modified independence using energy conservation/work simplification techniques as needed.    Baseline  07/15/2018: Pt. continues to have difficulty with home management tasks.    Time  12    Period   Weeks    Status  On-going    Target Date  09/15/18      OT LONG TERM GOAL #3   Title  Pt will perform UE ADL tasks with modified independence    Baseline  07/15/2018: Pt. continues to be limited    Time  12    Period  Weeks    Status  On-going    Target Date  09/15/18      OT LONG TERM GOAL #4   Title  Pt. will perform hair/wig care with modified indpendence using compensatory strategies.     Baseline  06/23/2018: Pt. conitnues to have difficulty performing hair/wig care    Time  12    Period  Weeks    Status  On-going    Target Date  09/15/18      OT LONG TERM GOAL #5   Title  Pt. will manipulate small buttons with modified independence.    Baseline  06/24/2019: Pt conitnues to require increased time to complete buttoning.    Time  12    Period  Weeks    Status  On-going    Target Date  09/15/18            Plan - 07/15/18 1238    Clinical Impression Statement  Pt. reports that she slept all day yesterday. Pt. reports that her PCP does not want her to have anymore injections in her left shoulder due to her blood sugar levels following the injections. Pt. reports wearing her left wrist brace about 2x's a week. Pt. continues to present with left shoulder pain which limits her ability to reach during ADL, and IADL tasks. Pt. continues to present with impaired motor control, and [facility] skills. Pt. continues to work on improving ADL, and IADL functioning.     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 Pt enjoys singing for leisure however has not been able to do so since experiencing CVA.    Occupational performance deficits (Please refer to evaluation for details):  ADL's;IADL's;Leisure    Rehab Potential  Good    Current Impairments/barriers affecting progress:  Positive: age, motivate, family suppory; Negative:     OT Frequency  2x / week    OT  Duration  12 weeks    OT Treatment/Interventions  Self-care/ADL training;Therapeutic exercise;Neuromuscular education;Therapeutic  activities;Manual Therapy;Passive range of motion    Clinical Decision Making  Several treatment options, min-mod task modification necessary    Consulted and Agree with Plan of Care  Patient       Patient will benefit from skilled therapeutic intervention in order to improve the following deficits and impairments:  Pain, Decreased cognition, Decreased range of motion, Decreased strength, Impaired perceived functional ability, Impaired UE functional use  Visit Diagnosis: Muscle weakness (generalized)  Other lack of coordination    Problem List Patient Active Problem List   Diagnosis Date Noted  . Migraine 02/23/2018  . HIV disease ([place])   . Stroke (cerebrum) ([place]) - R basal ganglia d/t small vessel dz 02/18/2018  . HLD (hyperlipidemia) 08/02/2014  . Diabetes mellitus type 2, uncontrolled, with complications ([place]) 24/17/5301  . Ulcerative colitis ([place]) 06/26/2008  . Depression, recurrent ([place]) 04/04/2008    Harrel Carina, MS, OTR/L 07/15/2018, 12:50 PM  [place] MAIN [facility] SERVICES [address] [place], [place], [address] Phone: [phone]   Fax:  [phone]  Name: Capricia Serda MRN: [id number] Date of Birth: [birth year]

## 2018-07-16 MED ORDER — MIRTAZAPINE 7.5 MG PO TABS: 7.5000 mg | ORAL_TABLET | Freq: Every day | ORAL | 5 refills | Status: DC

## 2018-07-16 NOTE — Telephone Encounter (Signed)
Last filled 06/12/2018... please advise if okay for pt to continue.

## 2018-07-20 ENCOUNTER — Ambulatory Visit: Payer: BLUE CROSS/BLUE SHIELD | Admitting: Occupational Therapy

## 2018-07-20 ENCOUNTER — Encounter: Payer: Self-pay | Admitting: Occupational Therapy

## 2018-07-20 DIAGNOSIS — R278 Other lack of coordination: Secondary | ICD-10-CM | POA: Diagnosis not present

## 2018-07-20 DIAGNOSIS — M6281 Muscle weakness (generalized): Secondary | ICD-10-CM | POA: Diagnosis not present

## 2018-07-20 NOTE — Therapy (Signed)
[place] MAIN [facility] SERVICES [address] [place], [place], [address] Phone: [phone]   Fax:  [phone]  Occupational Therapy Treatment  Patient Details  Name: [name] MRN: [id number] Date of Birth: 13-Dec-1955 No data recorded  Encounter Date: 07/20/2018  OT End of Session - 07/20/18 1104    Visit Number  21    Number of Visits  24    Date for OT Re-Evaluation  09/15/18    Authorization Type  Visit 1 of 10 for progress report period starting 05/20/2018       Past Medical History:  Diagnosis Date  . Asthma   . Diabetes mellitus without complication ([place]) 8-92-11   hx. NIDDM-dx. 3 weeks ago  . Hyperlipidemia    hx  . Seasonal allergies   . Stroke ([place])   . Ulcerative colitis [facility])     Past Surgical History:  Procedure Laterality Date  . BUNIONECTOMY     bilateral and toe nails of big toes removed  . CESAREAN SECTION  1997/2006  . CHOLECYSTECTOMY N/A 12/01/2012   Procedure: LAPAROSCOPIC CHOLECYSTECTOMY;  Surgeon: Stark Klein, MD;  Location: [facility];  Service: General;  Laterality: N/A;  . COLONOSCOPY W/ POLYPECTOMY    . EYE SURGERY Bilateral 2013  . TONSILLECTOMY      There were no vitals filed for this visit.  Subjective Assessment - 07/20/18 1155    Subjective   Pt. reports that she is doing what she can.    Pertinent History  Pt is a 63 y.o. female presenting with LUE weakness and decreased [place] as a result of R basal ganglia CVA on 8/08. Pt was hospitalized for 1 week where she received inpatient therapy and was then discharged home where she received home health therapy for 2 weeks. Prior to CVA, pt was independent for ADLs and IADLs. Currently, pt requires increased assistance, increased time, and/or modified strategies for ADLs and IADLs.     Patient Stated Goals  Pt wants to be able to do everything with her L hand that her R hand can do, including making wigs, picking up laundry baskets, manipulating coins for  shopping, and grooming hair.    Currently in Pain?  Yes    Pain Score  7     Pain Location  Shoulder    Pain Orientation  Left      OT TREATMENT    Neuro muscular re-education:  Pt. worked on  Left hand [facility] skills translatory movements of the hand using 1/2" flat marbles with 3 at a time in her palm. Pt. worked on grasping, and storing 1/2" flat marbles 12 at a time. Pt. worked on grasping 1" resistive cubes alternating thumb opposition to the tip of the 2nd digit while the board is placed at a vertical angle. Pt. worked on pressing the cubes back into place with her 2nd digit.  Therapeutic Exercise:  Pt. worked on [employer] for LUE shoulder flexion using a flat tabletop surface, followed by using a medium incline wedge. Pt. worked on elbow flexion, extension, forearm supination, and pronation strengthening with 1# dumbbell weight.                                 OT Education - 07/20/18 1156    Education Details  LUE functioning    Person(s) Educated  Patient    Methods  Explanation;Verbal cues;Demonstration    Comprehension  Verbalized understanding;Verbal  cues required          OT Long Term Goals - 07/15/18 1247      OT LONG TERM GOAL #1   Title  Pt will improve [place] by 10 secs of speed in order to independently manipulate coins during grocery shopping    Baseline  07/15/2018: [facility] skills are improving. Pt. continues to have difficulty manipulating objects, and coins.    Time  12    Period  Weeks    Status  On-going    Target Date  09/15/18      OT LONG TERM GOAL #2   Title  Pt. will perform light homemaking tasks with modified independence using energy conservation/work simplification techniques as needed.    Baseline  07/15/2018: Pt. continues to have difficulty with home management tasks.    Time  12    Period  Weeks    Status  On-going    Target Date  09/15/18      OT LONG TERM GOAL #3   Title  Pt will perform UE ADL tasks with modified  independence    Baseline  07/15/2018: Pt. continues to be limited    Time  12    Period  Weeks    Status  On-going    Target Date  09/15/18      OT LONG TERM GOAL #4   Title  Pt. will perform hair/wig care with modified indpendence using compensatory strategies.     Baseline  06/23/2018: Pt. conitnues to have difficulty performing hair/wig care    Time  12    Period  Weeks    Status  On-going    Target Date  09/15/18      OT LONG TERM GOAL #5   Title  Pt. will manipulate small buttons with modified independence.    Baseline  06/24/2019: Pt conitnues to require increased time to complete buttoning.    Time  12    Period  Weeks    Status  On-going    Target Date  09/15/18            Plan - 07/20/18 1157    Clinical Impression Statement Pt. presents with LUE pain, limited left shoulder ROM, weakness, and limited motor control, and [place] skills in the LUE which hinders her ability to perform functional reaching, and functional hand use during ADLs, and IADL tasks. Pt. continues to work on impoving overall ADL, and IADL functioning.      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 Pt enjoys singing for leisure however has not been able to do so since experiencing CVA.    Occupational performance deficits (Please refer to evaluation for details):  ADL's;IADL's;Leisure    Rehab Potential  Good    Current Impairments/barriers affecting progress:  Positive: age, motivate, family suppory; Negative:     OT Frequency  2x / week    OT Duration  12 weeks    OT Treatment/Interventions  Self-care/ADL training;Therapeutic exercise;Neuromuscular education;Therapeutic activities;Manual Therapy;Passive range of motion    Clinical Decision Making  Several treatment options, min-mod task modification necessary    Consulted and Agree with Plan of Care  Patient        Patient will benefit from skilled therapeutic intervention in order to improve the following deficits and impairments:  Pain, Decreased cognition, Decreased range of motion, Decreased strength, Impaired perceived functional ability, Impaired UE functional  use  Visit Diagnosis: Muscle weakness (generalized)    Problem List Patient Active Problem List   Diagnosis Date Noted  . Migraine 02/23/2018  . HIV disease ([place])   . Stroke (cerebrum) ([place]) - R basal ganglia d/t small vessel dz 02/18/2018  . HLD (hyperlipidemia) 08/02/2014  . Diabetes mellitus type 2, uncontrolled, with complications ([place]) 16/04/9603  . Ulcerative colitis ([place]) 06/26/2008  . Depression, recurrent ([place]) 04/04/2008    Harrel Carina, MS, OTR/L 07/20/2018, 12:10 PM  [place] MAIN [facility] SERVICES [address] [place], [place], [address] Phone: [phone]   Fax:  [phone]  Name: [name] MRN: [id number] Date of Birth: 1955/12/08

## 2018-07-21 ENCOUNTER — Ambulatory Visit (INDEPENDENT_AMBULATORY_CARE_PROVIDER_SITE_OTHER): Payer: BLUE CROSS/BLUE SHIELD | Admitting: Family Medicine

## 2018-07-21 ENCOUNTER — Encounter: Payer: Self-pay | Admitting: Family Medicine

## 2018-07-21 VITALS — BP 120/84 | HR 68 | Temp 98.4°F | Ht 62.0 in | Wt 140.2 lb

## 2018-07-21 DIAGNOSIS — E1165 Type 2 diabetes mellitus with hyperglycemia: Secondary | ICD-10-CM

## 2018-07-21 DIAGNOSIS — M25512 Pain in left shoulder: Secondary | ICD-10-CM

## 2018-07-21 DIAGNOSIS — I63511 Cerebral infarction due to unspecified occlusion or stenosis of right middle cerebral artery: Secondary | ICD-10-CM | POA: Diagnosis not present

## 2018-07-21 DIAGNOSIS — M25332 Other instability, left wrist: Secondary | ICD-10-CM | POA: Diagnosis not present

## 2018-07-21 NOTE — Patient Instructions (Signed)
REFERRALS TO SPECIALISTS, SPECIAL TESTS (MRI, CT, ULTRASOUNDS)  MARION or  Anastasiya will help you. ASK CHECK-IN FOR HELP.  Specialist appointment times vary a great deal, based on their schedule / openings. -- Some specialists have very long wait times. (Example. Dermatology)

## 2018-07-21 NOTE — Progress Notes (Signed)
Dr. Frederico Hamman T. Nohely Whitehorn, MD, [facility] [place], [address] Phone: [phone] Fax: [phone]  07/21/2018  Patient: [name], MRN: [id number], DOB: 02-20-1956, 63 y.o.  Primary Physician:  Jearld Fenton, NP   Chief Complaint  Patient presents with  . Muscle Pain    S/P Stroke 02/18/18  . Arm Pain    Left   Subjective:   [name] is a 63 y.o. very pleasant female patient who presents with the following:  H/o 02/2018 stroke  She has been doing some OT and has seen [place] 12/19 and she has also seen hand surgeon Dr. Peggye Ley and dx with a scapholunate dissociation CT proven - it appears that he ordered an MR arthrogram on 06/25/2018, but no one contacted her and it has not been done.  Post stroke, she has been having difficulty with her left arm in general.  Dr. Mack Guise has done per the record an intra-articular injection, subacromial injection, and the patient tells me she also had a biceps tendon sheath injection.  She continues to have significant pain and dysfunction as well as weakness and loss of motion on the left side at her shoulder.  She also has been doing occupational therapy.  She also continues to have some pain, dysfunction, inability to form a complete grip as well as some swelling in the wrist and pain in the wrist in general.  On record review, it looks as if both Dr. Mack Guise and the hand surgeon both wanted her to follow-up, but some of this seems to have been lost in translation.  Past Medical History, Surgical History, Social History, Family History, Problem List, Medications, and Allergies have been reviewed and updated if relevant.  Patient Active Problem List   Diagnosis Date Noted  . Migraine 02/23/2018  . HIV disease ([place])   . Stroke (cerebrum) ([place]) - R basal ganglia d/t small vessel dz 02/18/2018  . HLD (hyperlipidemia) 08/02/2014  . Diabetes mellitus type 2,  uncontrolled, with complications ([place]) 62/86/3817  . Ulcerative colitis ([place]) 06/26/2008  . Depression, recurrent ([place]) 04/04/2008    Past Medical History:  Diagnosis Date  . Asthma   . Diabetes mellitus without complication ([place]) [birth year]   hx. NIDDM-dx. 3 weeks ago  . Hyperlipidemia    hx  . Seasonal allergies   . Stroke ([place])   . Ulcerative colitis [facility])     Past Surgical History:  Procedure Laterality Date  . BUNIONECTOMY     bilateral and toe nails of big toes removed  . CESAREAN SECTION  1997/2006  . CHOLECYSTECTOMY N/A 12/01/2012   Procedure: LAPAROSCOPIC CHOLECYSTECTOMY;  Surgeon: Stark Klein, MD;  Location: [facility];  Service: General;  Laterality: N/A;  . COLONOSCOPY W/ POLYPECTOMY    . EYE SURGERY Bilateral 2013  . TONSILLECTOMY      Social History   Socioeconomic History  . Marital status: Legally Separated    Spouse name: Not on file  . Number of children: Not on file  . Years of education: Not on file  . Highest education level: Not on file  Occupational History  . Not on file  Social Needs  . Financial resource strain: Not on file  . Food insecurity:    Worry: Not on file    Inability: Not on file  . Transportation needs:    Medical: Not on file    Non-medical: Not on file  Tobacco Use  . Smoking status:  Never Smoker  . Smokeless tobacco: Never Used  Substance and Sexual Activity  . Alcohol use: Not Currently    Alcohol/week: 0.0 standard drinks    Comment: occasional  . Drug use: No  . Sexual activity: Not Currently    Partners: Male    Comment: offered condoms  Lifestyle  . Physical activity:    Days per week: Not on file    Minutes per session: Not on file  . Stress: Not on file  Relationships  . Social connections:    Talks on phone: Not on file    Gets together: Not on file    Attends religious service: Not on file    Active member of club or organization: Not on file    Attends meetings of clubs or organizations: Not on file     Relationship status: Not on file  . Intimate partner violence:    Fear of current or ex partner: Not on file    Emotionally abused: Not on file    Physically abused: Not on file    Forced sexual activity: Not on file  Other Topics Concern  . Not on file  Social History Narrative  . Not on file    Family History  Problem Relation Age of Onset  . Hypertension Mother   . Diabetes Mother   . Cancer Father        lung ca  . Seizures Daughter        7yr    Allergies  Allergen Reactions  . Azithromycin Itching    Muscle aches  . Erythromycin Nausea And Vomiting  . Penicillins Nausea And Vomiting    Has patient had a PCN reaction causing immediate rash, facial/tongue/throat swelling, SOB or lightheadedness with hypotension: Yes Has patient had a PCN reaction causing severe rash involving mucus membranes or skin necrosis: No Has patient had a PCN reaction that required hospitalization No Has patient had a PCN reaction occurring within the last 10 years: No If all of the above answers are "NO", then may proceed with Cephalosporin use.   . Tetracycline Nausea And Vomiting    Medication list reviewed and updated in full in [facility]  GEN: No fevers, chills. Nontoxic. Primarily MSK c/o today. MSK: Detailed in the HPI GI: tolerating PO intake without difficulty Neuro: No numbness, parasthesias, or tingling associated. Otherwise the pertinent positives of the ROS are noted above.   Objective:   BP 120/84   Pulse 68   Temp 98.4 F (36.9 C) (Oral)   Ht 5' 2"  (1.575 m)   Wt 140 lb 4 oz (63.6 kg)   BMI 25.65 kg/m    GEN: WDWN, NAD, Non-toxic, Alert & Oriented x 3 HEENT: Atraumatic, Normocephalic.  Ears and Nose: No external deformity. EXTR: No clubbing/cyanosis/edema NEURO: Normal gait.  PSYCH: Normally interactive. Conversant. Not depressed or anxious appearing.  Calm demeanor.    At the left wrist, patient does have some obvious fullness and swelling at the  true wrist compared to the right side.  She does have some pain with extension and flexion at the wrist is well is some significant pain with resisted supination at the wrist.  She also is unable to make a complete composite fist on the left.  Grip strength is markedly decreased compared to the right side.  Left shoulder: Nontender along the clavicle, minimally tender at the [facility].  She does have some tenderness in the bicipital groove.  She also has some tenderness in  the anterior as well as posterior shoulder.  Shoulder is located, and she has a negative sulcus sign.  Abduction: 4/5.  Patient is limited in abduction to approximately 130 degrees. Flexion: 4+/5, limited to 155 degrees. Internal range of motion: Limited to 25 degrees and 90 degrees of abduction.  Strength is 5/5 External range of motion: Limited to 75 degrees on the left, strength is 4+/5.  Crossover causes pain.  O'Brien's is positive, and Michel Bickers and Neer testing are both positive.  Radiology: Ct Wrist Left Wo Contrast  Result Date: 06/22/2018 CLINICAL DATA:  Left wrist pain EXAM: CT OF THE LEFT WRIST WITHOUT CONTRAST TECHNIQUE: Multidetector CT imaging was performed according to the standard protocol. Multiplanar CT image reconstructions were also generated. COMPARISON:  None. FINDINGS: Bones/Joint/Cartilage Generalized osteopenia. No fracture or dislocation. Normal alignment. Small radiocarpal and midcarpal joint effusion. No aggressive osseous lesion. Widening of the scapholunate distance as can be seen with a scapholunate ligament tear. Moderate osteoarthritis of the first Scofield joint. Mild osteoarthritis of the first MCP joint. Dorsal tilting of the lunate which may be positional versus secondary to DISI. Ligaments Ligaments are suboptimally evaluated by CT. Muscles and Tendons Muscles are normal. Flexor and extensor compartment tendons are intact. Soft tissue No fluid collection or hematoma.  No soft tissue mass.  IMPRESSION: 1. Widening of the scapholunate distance as can be seen with a scapholunate ligament tear. Dorsal tilting of the lunate which may be positional versus secondary to DISI. 2.  No acute osseous injury of the left wrist. Electronically Signed   By: Kathreen Devoid   On: 06/22/2018 16:30   CLINICAL DATA:  Left shoulder pain x 2 months  EXAM: LEFT SHOULDER - 2+ VIEW  COMPARISON:  None.  FINDINGS: Glenohumeral joint is intact. No evidence of scapular fracture or humeral fracture. The acromioclavicular joint is intact. No significant arthropathy. Mild spurring of the acromioclavicular joint.  IMPRESSION: No significant arthropathy.  No acute findings.   Electronically Signed   By: Suzy Bouchard M.D.   On: 05/03/2018 17:00  Assessment and Plan:   Left shoulder pain, unspecified chronicity - Plan: MR SHOULDER LEFT W CONTRAST, DG FLUORO GUIDED NEEDLE PLC ASPIRATION/INJECTION LOC, Ambulatory referral to Orthopedic Surgery  Scapholunate dissociation of left wrist  Cerebrovascular accident (CVA) due to stenosis of right middle cerebral artery ([place])  Type 2 diabetes mellitus with hyperglycemia, without long-term current use of insulin ([facility])  Current scapholunate dissociation, she has been seen by hand surgery, and I think really seeing her again would be most advisable option.  Other options such as wrist injection versus potential surgery could be considered, but ultimately I would defer to hand surgery in this case.  For now, I am going to have her resume wearing her wrist brace intermittently when she is more active with her hands, and ice her wrist twice a day.  I completely agree with Dr. Harden Mo plan of care.  She has failed conservative management thus far and she has functional disability at the left shoulder and ongoing pain.  Limited strength as well as loss of motion.  Obtain an MR arthrogram of the left shoulder to evaluate for rotator cuff integrity, and  additionally evaluate for labral tear and possible SLAP lesion.  Exam is decidedly abnormal as above. F/u with Dr. Mack Guise after MR arthrogram has returned.  Orders Placed This Encounter  Procedures  . MR SHOULDER LEFT W CONTRAST  . DG FLUORO GUIDED NEEDLE PLC ASPIRATION/INJECTION LOC  . Ambulatory referral  to Orthopedic Surgery    Signed,  Frederico Hamman T. Karas Pickerill, MD   Outpatient Encounter Medications as of 07/21/2018  Medication Sig  . acetaminophen (TYLENOL) 325 MG tablet Take 2 tablets (650 mg total) by mouth every 6 (six) hours as needed for mild pain (or temp > 37.5 C (99.5 F)).  Marland Kitchen albuterol (PROVENTIL HFA;VENTOLIN HFA) 108 (90 Base) MCG/ACT inhaler Inhale 2 puffs into the lungs every 6 (six) hours as needed for wheezing or shortness of breath.  Marland Kitchen aspirin EC 81 MG EC tablet Take 1 tablet (81 mg total) by mouth daily.  Marland Kitchen atorvastatin (LIPITOR) 40 MG tablet TAKE 1 TABLET (40 MG TOTAL) BY MOUTH DAILY AT 6 PM.  . bictegravir-emtricitabine-tenofovir AF (BIKTARVY) 50-200-25 MG TABS tablet Take 1 tablet by mouth daily.  . diclofenac sodium (VOLTAREN) 1 % GEL Apply 1 application topically 3 (three) times daily. Left knee  . glipiZIDE (GLUCOTROL) 10 MG tablet TAKE ONE TABLET BY MOUTH TWICE A DAY BEFORE A MEAL  . glucose 4 GM chewable tablet Chew 1 tablet (4 g total) by mouth as needed for low blood sugar.  Marland Kitchen glucose blood (TRUE METRIX BLOOD GLUCOSE TEST) test strip 1 each by Other route 2 (two) times daily. Use as instructed  . methocarbamol (ROBAXIN) 500 MG tablet Take 1 tablet (500 mg total) by mouth 2 (two) times daily as needed for muscle spasms (sedation precaution).  . mirtazapine (REMERON) 7.5 MG tablet Take 1 tablet (7.5 mg total) by mouth at bedtime.  . potassium chloride SA (K-DUR,KLOR-CON) 20 MEQ tablet Take 1 tablet (20 mEq total) by mouth daily.  Marland Kitchen topiramate (TOPAMAX) 100 MG tablet TAKE ONE TABLET BY MOUTH AT BEDTIME AS NEEDED  . topiramate (TOPAMAX) 50 MG tablet Take 1 tablet (50  mg total) by mouth at bedtime.   No facility-administered encounter medications on file as of 07/21/2018.

## 2018-07-22 ENCOUNTER — Ambulatory Visit: Payer: BLUE CROSS/BLUE SHIELD | Admitting: Occupational Therapy

## 2018-07-22 DIAGNOSIS — H18603 Keratoconus, unspecified, bilateral: Secondary | ICD-10-CM | POA: Diagnosis not present

## 2018-07-23 ENCOUNTER — Telehealth: Payer: Self-pay | Admitting: Family Medicine

## 2018-07-23 NOTE — Telephone Encounter (Signed)
Called the patient to schedule MRI and Ortho followup. Patient is scheduled for MRI on 07/27/2018 and has a Dr followup after the MRI on 07/30/2018 with [facility]. Please cancel the MRI order and I will cancel the Ortho followup referral as patient has been taken care of.

## 2018-07-23 NOTE — Telephone Encounter (Signed)
done

## 2018-07-23 NOTE — Addendum Note (Signed)
Addended by: Owens Loffler on: 07/23/2018 09:59 AM   Modules accepted: Orders

## 2018-07-26 ENCOUNTER — Other Ambulatory Visit: Payer: Self-pay | Admitting: Physical Medicine & Rehabilitation

## 2018-07-26 DIAGNOSIS — F329 Major depressive disorder, single episode, unspecified: Secondary | ICD-10-CM

## 2018-07-27 ENCOUNTER — Encounter: Payer: Self-pay | Admitting: Adult Health

## 2018-07-27 ENCOUNTER — Ambulatory Visit (INDEPENDENT_AMBULATORY_CARE_PROVIDER_SITE_OTHER): Payer: BLUE CROSS/BLUE SHIELD | Admitting: Adult Health

## 2018-07-27 VITALS — BP 116/69 | HR 73 | Ht 62.0 in | Wt 142.2 lb

## 2018-07-27 DIAGNOSIS — I10 Essential (primary) hypertension: Secondary | ICD-10-CM | POA: Diagnosis not present

## 2018-07-27 DIAGNOSIS — E785 Hyperlipidemia, unspecified: Secondary | ICD-10-CM

## 2018-07-27 DIAGNOSIS — E119 Type 2 diabetes mellitus without complications: Secondary | ICD-10-CM

## 2018-07-27 DIAGNOSIS — I63511 Cerebral infarction due to unspecified occlusion or stenosis of right middle cerebral artery: Secondary | ICD-10-CM

## 2018-07-27 MED ORDER — TIZANIDINE HCL 2 MG PO CAPS: 4.0000 mg | ORAL_CAPSULE | Freq: Every day | ORAL | 0 refills | Status: DC

## 2018-07-27 NOTE — Progress Notes (Signed)
[facility] [address] [place]. [place] [address] [phone]       OFFICE FOLLOW UP NOTE  [name] Date of Birth:  May 28, 1956 Medical Record Number:  [id number]   Reason for Referral:  hospital stroke follow up  CHIEF COMPLAINT:  Chief Complaint  Patient presents with  . Follow-up    6 month follow up. Alone. Treatment room. No new concerns at this time.     HPI:  Interval history 07/27/2018: Patient is being seen today for follow-up visit.  She continues to have left arm pain with trial of injections but per notes, no relief.  CT wrist showed scapholunate dissociation. She states that she will have MRI completed today and then Friday will have follow up appointment. She states pain continues all through hand up to shoulder. Does endorse difficulty sleeping at night due to continued pain. She was prescribed remeron by PCP which has been helping her sleep but does continue to wake up occassionally due to pain.  Patient is frustrated by this continued pain as she has been having studies done but has not been receiving results per patient along with being told that different treatment plans should be done such as undergoing surgery versus not undergoing surgery.  She does endorse having NCV with EMG performed and was told that she has no indication of nerve damage.  She is fearful of ending up with frozen shoulder as a result of limited ROM in right arm along with difficulty participating in therapies for continued weakness.  She does continue to have left-sided facial paralysis with slurred speech and occasional mental slowing but otherwise has been recovering well from a stroke standpoint.  She continues on aspirin 81 mg without side effects of bleeding or bruising.  Continues on atorvastatin without side effects myalgias.  Blood pressure today satisfactory 116/69.  No further concerns at this time.  Denies new or worsening stroke/TIA symptoms.    Initial  visit 04/06/2018: Patient is being seen today for hospital follow-up.  She continues to have left hemiparesis and dysarthria but has been improving.  She has completed PT/ST and is currently just receiving OT at this time.  She has completed 3 weeks of DAPT but has continued to take aspirin 81 mg along with Plavix 75 mg . Denies side effects of bleeding or bruising spoke to patient regarding discontinuation of Plavix at this time and continuation of aspirin 81 mg daily.  Continues to take Lipitor 40 mg.  She does endorse left shoulder/arm pain which has been present since a stroke and is unable to determine if this could be due to statin myalgias or left hemiparesis.  Blood pressure today satisfactory 125/82.  Does monitor glucose levels at home and this morning's level 84.  She takes Topamax 50 mg at night consistently where prior to admission, she was taking it as needed.  She denies any recent headaches.  Denies new or worsening stroke/TIA symptoms.   Hospital admission 02/18/2018: ShannonShannon Wade a 63 y.o.femalewith history of DM, HLD, ulcerative colitis who was admitted to [facility] hospital for left facial droop, slurred speech and left hemiparesis. Patient was evaluated by telemetry neurology and after negative CT had obtained,  IV tPA was administered without complication and was transferred to [facility] for further evaluation and management.  MRI brain reviewed and showed right small BG infarct.  CTA head and neck unremarkable.  2D echo showed an EF of 55 to 60%.  Determined thatinfarct was due tosmall vessel  disease.Does have a history of vascular risk factors of HTN, HLD and DB.  Patient was also found to have HIV during stroke workup. ID was consulted and was started on biktarvy (per notes, patient requested to not mention this other family members). LDL 136 and his patient was on statin PTA recommended Lipitor 40 mg at discharge.  HTN stable during admission and recommended long-term BP  goal normotensive range.  A1c 9.1 and per notes, patient had stopped all home diabetic medications due to hypoglycemia which was confirmed during hospital admission with BG 66.  Recommended continued PCP follow-up for DM management.  Patient was not on antithrombotic PTA and recommended DAPT for 3 weeks and then aspirin alone.  Also endorses history of migraine and did increase Topamax dosage during hospital admission with possible consideration of following up with Dr. Jaynee Eagles as outpatient for continued management.  Patient was discharged to [facility] for continued PT/OT/ST.     ROS:   14 system review of systems performed and negative with exception of joint pain, joint swelling, aching muscles and muscle cramps  PMH:  Past Medical History:  Diagnosis Date  . Asthma   . Diabetes mellitus without complication ([place]) 9-38-18   hx. NIDDM-dx. 3 weeks ago  . Hyperlipidemia    hx  . Seasonal allergies   . Stroke ([place])   . Ulcerative colitis ([place])     PSH:  Past Surgical History:  Procedure Laterality Date  . BUNIONECTOMY     bilateral and toe nails of big toes removed  . CESAREAN SECTION  1997/2006  . CHOLECYSTECTOMY N/A 12/01/2012   Procedure: LAPAROSCOPIC CHOLECYSTECTOMY;  Surgeon: Stark Klein, MD;  Location: [facility];  Service: General;  Laterality: N/A;  . COLONOSCOPY W/ POLYPECTOMY    . EYE SURGERY Bilateral 2013  . TONSILLECTOMY      Social History:  Social History   Socioeconomic History  . Marital status: Legally Separated    Spouse name: Not on file  . Number of children: Not on file  . Years of education: Not on file  . Highest education level: Not on file  Occupational History  . Not on file  Social Needs  . Financial resource strain: Not on file  . Food insecurity:    Worry: Not on file    Inability: Not on file  . Transportation needs:    Medical: Not on file    Non-medical: Not on file  Tobacco Use  . Smoking status: Never Smoker  . Smokeless tobacco: Never Used    Substance and Sexual Activity  . Alcohol use: Not Currently    Alcohol/week: 0.0 standard drinks    Comment: occasional  . Drug use: No  . Sexual activity: Not Currently    Partners: Male    Comment: offered condoms  Lifestyle  . Physical activity:    Days per week: Not on file    Minutes per session: Not on file  . Stress: Not on file  Relationships  . Social connections:    Talks on phone: Not on file    Gets together: Not on file    Attends religious service: Not on file    Active member of club or organization: Not on file    Attends meetings of clubs or organizations: Not on file    Relationship status: Not on file  . Intimate partner violence:    Fear of current or ex partner: Not on file    Emotionally abused: Not on file  Physically abused: Not on file    Forced sexual activity: Not on file  Other Topics Concern  . Not on file  Social History Narrative  . Not on file    Family History:  Family History  Problem Relation Age of Onset  . Hypertension Mother   . Diabetes Mother   . Cancer Father        lung ca  . Seizures Daughter        38yr    Medications:   Current Outpatient Medications on File Prior to Visit  Medication Sig Dispense Refill  . acetaminophen (TYLENOL) 325 MG tablet Take 2 tablets (650 mg total) by mouth every 6 (six) hours as needed for mild pain (or temp > 37.5 C (99.5 F)).    .Marland Kitchenalbuterol (PROVENTIL HFA;VENTOLIN HFA) 108 (90 Base) MCG/ACT inhaler Inhale 2 puffs into the lungs every 6 (six) hours as needed for wheezing or shortness of breath. 1 Inhaler 0  . aspirin EC 81 MG EC tablet Take 1 tablet (81 mg total) by mouth daily.    .Marland Kitchenatorvastatin (LIPITOR) 40 MG tablet TAKE 1 TABLET (40 MG TOTAL) BY MOUTH DAILY AT 6 PM. 30 tablet 5  . bictegravir-emtricitabine-tenofovir AF (BIKTARVY) 50-200-25 MG TABS tablet Take 1 tablet by mouth daily. 30 tablet 5  . citalopram (CELEXA) 10 MG tablet TAKE ONE TABLET BY MOUTH AT BEDTIME 30 tablet 0  .  diclofenac sodium (VOLTAREN) 1 % GEL Apply 1 application topically 3 (three) times daily. Left knee 3 Tube 4  . glipiZIDE (GLUCOTROL) 10 MG tablet TAKE ONE TABLET BY MOUTH TWICE A DAY BEFORE A MEAL 60 tablet 1  . glucose 4 GM chewable tablet Chew 1 tablet (4 g total) by mouth as needed for low blood sugar. 30 tablet 3  . glucose blood (TRUE METRIX BLOOD GLUCOSE TEST) test strip 1 each by Other route 2 (two) times daily. Use as instructed 100 each 2  . methocarbamol (ROBAXIN) 500 MG tablet Take 1 tablet (500 mg total) by mouth 2 (two) times daily as needed for muscle spasms (sedation precaution). 30 tablet 0  . mirtazapine (REMERON) 7.5 MG tablet Take 1 tablet (7.5 mg total) by mouth at bedtime. 30 tablet 5  . potassium chloride SA (K-DUR,KLOR-CON) 20 MEQ tablet Take 1 tablet (20 mEq total) by mouth daily. 30 tablet 5  . topiramate (TOPAMAX) 100 MG tablet TAKE ONE TABLET BY MOUTH AT BEDTIME AS NEEDED 30 tablet 2  . topiramate (TOPAMAX) 50 MG tablet Take 1 tablet (50 mg total) by mouth at bedtime. 30 tablet 2   No current facility-administered medications on file prior to visit.     Allergies:   Allergies  Allergen Reactions  . Azithromycin Itching    Muscle aches  . Erythromycin Nausea And Vomiting  . Penicillins Nausea And Vomiting    Has patient had a PCN reaction causing immediate rash, facial/tongue/throat swelling, SOB or lightheadedness with hypotension: Yes Has patient had a PCN reaction causing severe rash involving mucus membranes or skin necrosis: No Has patient had a PCN reaction that required hospitalization No Has patient had a PCN reaction occurring within the last 10 years: No If all of the above answers are "NO", then may proceed with Cephalosporin use.   . Tetracycline Nausea And Vomiting     Physical Exam  Vitals:   07/27/18 1039  BP: 116/69  Pulse: 73  Weight: 142 lb 3.2 oz (64.5 kg)  Height: 5' 2"  (1.575 m)  Body mass index is 26.01 kg/m. No exam data  present  General: well developed, well nourished, pleasant middle-aged African-American female, seated, in no evident distress Head: head normocephalic and atraumatic.   Neck: supple with no carotid or supraclavicular bruits Cardiovascular: regular rate and rhythm, no murmurs Musculoskeletal: no deformity Skin:  no rash/petichiae Vascular:  Normal pulses all extremities  Neurologic Exam Mental Status: Awake and fully alert.  Mild dysarthria.  Oriented to place and time. Recent and remote memory intact. Attention span, concentration and fund of knowledge appropriate. Mood and affect appropriate.  Cranial Nerves: Fundoscopic exam reveals sharp disc margins. Pupils equal, briskly reactive to light. Extraocular movements full without nystagmus. Visual fields full to confrontation. Hearing intact. Facial sensation intact. Face, tongue, palate moves normally and symmetrically.  Motor: Normal bulk and tone.  LUE: Weak grip strength with difficulty examining upper LUE strength due to pain and limited ROM; LLE: 4-/5 hip flexor; full strength RUE and RLE Sensory.:  Decreased sensation on left upper and lower extremity compared to right upper and lower extremity Coordination: Rapid alternating movements normal on right side. Finger-to-nose and heel-to-shin performed accurately on right side.  Orbits right arm over left arm.  Decreased left hand dexterity Gait and Station: Arises from chair without difficulty. Stance is normal. Gait demonstrates normal stride length and balance except for mild hemiplegic gait -does not use assistive device.  Unable to tandem walk. Reflexes: 1+ and symmetric. Toes downgoing.       Diagnostic Data (Labs, Imaging, Testing)  Ct Head Code Stroke Wo Contrast 02/18/2018 IMPRESSION:  1. No hemorrhage or mass effect.  2. ASPECTS is 10.   Ct Angio Head W Or Wo Contrast Ct Angio Neck W Or Wo Contrast 02/18/2018 IMPRESSION:  No emergent large vessel occlusion. Normal CTA of  the head and neck   Mr Brain Wo Contrast 02/18/2018 IMPRESSION:  1. Acute small vessel infarct of the right basal ganglia, in close proximity to the right internal capsule, in keeping with reported left-sided weakness.  2. No hemorrhage or mass effect.   TTE - Left ventricle: The cavity size was normal. Wall thickness wasincreased in a pattern of mild LVH. Systolic function was normal.The estimated ejection fraction was in the range of 55% to 60%.Wall motion was normal; there were no regional wall motionabnormalities. Doppler parameters are consistent with abnormalleft ventricular relaxation (grade 1 diastolic dysfunction). Impressions: - No cardiac source of emboli was indentified.    ASSESSMENT: [name] is a 63 y.o. year old female here with right BG infarct on 02/18/2018 secondary to small vessel disease. Vascular risk factors include HLD and DM.  Patient is being seen today for follow-up visit and does continue to have left facial droop with slurred speech and LUE weakness with continued pain.    PLAN: -Continue aspirin 81 mg daily  and Lipitor 40 mg for secondary stroke prevention -Spoke to patient in regards to possible pain related to post stroke pain syndrome but did recommend to continue to follow with orthopedics to ensure no underlying condition. -Initiate Zanaflex 4 mg nightly for possible post stroke pain including muscle spasms and spasticity -F/u with PCP regarding your HLD and DM management -continue to monitor BP at home along with glucose levels -Maintain strict control of hypertension with blood pressure goal below 130/90, diabetes with hemoglobin A1c goal below 6.5% and cholesterol with LDL cholesterol (bad cholesterol) goal below 70 mg/dL. I also advised the patient to eat a healthy diet with plenty of whole grains,  cereals, fruits and vegetables, exercise regularly and maintain ideal body weight.  Follow up in 3 months or call earlier if  needed   Greater than 50% of time during this 25 minute visit was spent on counseling,explanation of diagnosis of right BG infarct, reviewing risk factor management of HLD and DM, planning of further management, discussion with patient and family and coordination of care    Venancio Poisson, AGNP-BC  [facility] [address] [place], [place] [address]  Phone [phone] Fax [phone] Note: This document was prepared with digital dictation and possible smart phrase technology. Any transcriptional errors that result from this process are unintentional.

## 2018-07-27 NOTE — Patient Instructions (Addendum)
Continue aspirin 81 mg daily  and lipitor  for secondary stroke prevention  Continue to follow up with PCP regarding cholesterol, blood presure and diabetes management   Start Zanaflex 44m nightly for muscle pain - after 1 week, if you continue to experience pain, please call office and we can consider increasing at that time   Continue therapies along with f/u with orthopedics for left arm pain - consider possible second opinion through [facility]. This pain could be due to post stroke pain but recommend to continue to follow with orthopedics to ensure no underlying issue  Continue to monitor blood pressure at home  Maintain strict control of hypertension with blood pressure goal below 130/90, diabetes with hemoglobin A1c goal below 6.5% and cholesterol with LDL cholesterol (bad cholesterol) goal below 70 mg/dL. I also advised the patient to eat a healthy diet with plenty of whole grains, cereals, fruits and vegetables, exercise regularly and maintain ideal body weight.  Followup in the future with me in 3 months or call earlier if needed       Thank you for coming [facility]. I hope we have been able to provide you high quality care today.  You may receive a patient satisfaction survey over the next few weeks. We would appreciate your feedback and comments so that we may continue to improve ourselves and the health of our patients.

## 2018-07-28 DIAGNOSIS — Z0271 Encounter for disability determination: Secondary | ICD-10-CM

## 2018-07-29 ENCOUNTER — Ambulatory Visit: Payer: BLUE CROSS/BLUE SHIELD | Admitting: Occupational Therapy

## 2018-07-29 ENCOUNTER — Encounter: Payer: Self-pay | Admitting: Occupational Therapy

## 2018-07-29 DIAGNOSIS — R278 Other lack of coordination: Secondary | ICD-10-CM

## 2018-07-29 DIAGNOSIS — M6281 Muscle weakness (generalized): Secondary | ICD-10-CM | POA: Diagnosis not present

## 2018-07-29 NOTE — Progress Notes (Signed)
I agree with the above plan 

## 2018-07-29 NOTE — Therapy (Signed)
[place] MAIN [facility] [address] [place], [place], [address] Phone: [phone]   Fax:  [phone]  Occupational Therapy Treatment  Patient Details  Name: [name] MRN: [id number] Date of Birth: 19-May-1956 No data recorded  Encounter Date: 07/29/2018  OT End of Session - 07/29/18 1313    Visit Number  22    Number of Visits  24    Date for OT Re-Evaluation  09/15/18    Authorization Type  Progress report period starting 05/20/2018    OT Start Time  1020    OT Stop Time  1045    OT Time Calculation (min)  25 min    Activity Tolerance  Patient tolerated treatment well    Behavior During Therapy  [facility] for tasks assessed/performed       Past Medical History:  Diagnosis Date  . Asthma   . Diabetes mellitus without complication ([place]) 5-36-64   hx. NIDDM-dx. 3 weeks ago  . Hyperlipidemia    hx  . Seasonal allergies   . Stroke ([place])   . Ulcerative colitis [facility])     Past Surgical History:  Procedure Laterality Date  . BUNIONECTOMY     bilateral and toe nails of big toes removed  . CESAREAN SECTION  1997/2006  . CHOLECYSTECTOMY N/A 12/01/2012   Procedure: LAPAROSCOPIC CHOLECYSTECTOMY;  Surgeon: Stark Klein, MD;  Location: [facility];  Service: General;  Laterality: N/A;  . COLONOSCOPY W/ POLYPECTOMY    . EYE SURGERY Bilateral 2013  . TONSILLECTOMY      There were no vitals filed for this visit.  Subjective Assessment - 07/29/18 1309    Subjective   Pt. reports she is trying to use her LUE during tasks at home.    Pertinent History  Pt is a 63 y.o. female presenting with LUE weakness and decreased [place] as a result of R basal ganglia CVA on 8/08. Pt was hospitalized for 1 week where she received inpatient therapy and was then discharged home where she received home health therapy for 2 weeks. Prior to CVA, pt was independent for ADLs and IADLs. Currently, pt requires increased assistance, increased time, and/or modified  strategies for ADLs and IADLs.     Patient Stated Goals  Pt wants to be able to do everything with her L hand that her R hand can do, including making wigs, picking up laundry baskets, manipulating coins for shopping, and grooming hair.    Currently in Pain?  Yes    Pain Score  9     Pain Location  Shoulder    Pain Orientation  Left    Pain Descriptors / Indicators  Aching    Pain Type  Chronic pain;Acute pain    Pain Onset  More than a month ago    Pain Score  5    Pain Location  Wrist    Pain Orientation  Left    Pain Descriptors / Indicators  Aching    Pain Type  Acute pain;Chronic pain    Pain Radiating Towards  The Left UE      OT TREATMENT    Neuro muscular re-education:   Pt. worked on [facility] skills seated at the table. Pt. worked on using her left 2nd digit to slide coins to edge to the table to her thumb in preparation for formulating a 2 pt. Grasp, and manipulating pennies, and dimes. Pt. worked on grasping, and stacking one coin at a time. Pt. required the height of  her seat to be adjusted, and increased with a pillow for improved access to the tabletop surface secondary to shoulder pain. Pt. tolerated the [facility] tasks without pain.                         OT Education - 07/29/18 1313    Education Details  Left hand [place]    Person(s) Educated  Patient    Methods  Explanation;Verbal cues;Demonstration    Comprehension  Verbalized understanding;Verbal cues required          OT Long Term Goals - 07/15/18 1247      OT LONG TERM GOAL #1   Title  Pt will improve [place] by 10 secs of speed in order to independently manipulate coins during grocery shopping    Baseline  07/15/2018: [facility] skills are improving. Pt. continues to have difficulty manipulating objects, and coins.    Time  12    Period  Weeks    Status  On-going    Target Date  09/15/18      OT LONG TERM GOAL #2   Title  Pt. will perform light homemaking tasks with modified independence using energy  conservation/work simplification techniques as needed.    Baseline  07/15/2018: Pt. continues to have difficulty with home management tasks.    Time  12    Period  Weeks    Status  On-going    Target Date  09/15/18      OT LONG TERM GOAL #3   Title  Pt will perform UE ADL tasks with modified independence    Baseline  07/15/2018: Pt. continues to be limited    Time  12    Period  Weeks    Status  On-going    Target Date  09/15/18      OT LONG TERM GOAL #4   Title  Pt. will perform hair/wig care with modified indpendence using compensatory strategies.     Baseline  06/23/2018: Pt. conitnues to have difficulty performing hair/wig care    Time  12    Period  Weeks    Status  On-going    Target Date  09/15/18      OT LONG TERM GOAL #5   Title  Pt. will manipulate small buttons with modified independence.    Baseline  06/24/2019: Pt conitnues to require increased time to complete buttoning.    Time  12    Period  Weeks    Status  On-going    Target Date  09/15/18            Plan - 07/29/18 1319    Clinical Impression Statement  Pt. was late for the treat session today. Pt. had an MRI scheduled, however reports she was unable to tolerate it secondary to left shoulder pain. Pt. reports that she has to go back for another MRI tomorrow. Pt. has a follow-up appointment scheduled with [facility] next week to reassess her Left shoulder. Left shoulder pain is significantly limiting pt.'s ability to participation in, and progress with OT, ADLs, and IADLs. Pt. also has left wrist limitations. Pt. reports that she wears her left wrist brace at night. Pt. will also call , and arrange a follow-up appointment for her left wrist at [facility].    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 Pt enjoys singing for leisure however has not  been able to do so since  experiencing CVA.    Occupational performance deficits (Please refer to evaluation for details):  ADL's;IADL's;Leisure    Rehab Potential  Good    Current Impairments/barriers affecting progress:  Positive: age, motivate, family suppory; Negative:     OT Frequency  2x / week    OT Duration  12 weeks    OT Treatment/Interventions  Self-care/ADL training;Therapeutic exercise;Neuromuscular education;Therapeutic activities;Manual Therapy;Passive range of motion    Clinical Decision Making  Several treatment options, min-mod task modification necessary    Consulted and Agree with Plan of Care  Patient       Patient will benefit from skilled therapeutic intervention in order to improve the following deficits and impairments:  Pain, Decreased cognition, Decreased range of motion, Decreased strength, Impaired perceived functional ability, Impaired UE functional use  Visit Diagnosis: Other lack of coordination    Problem List Patient Active Problem List   Diagnosis Date Noted  . Migraine 02/23/2018  . HIV disease ([place])   . Stroke (cerebrum) ([place]) - R basal ganglia d/t small vessel dz 02/18/2018  . HLD (hyperlipidemia) 08/02/2014  . Diabetes mellitus type 2, uncontrolled, with complications ([place]) 29/57/4734  . Ulcerative colitis ([place]) 06/26/2008  . Depression, recurrent ([place]) 04/04/2008    Harrel Carina, MS, OTR/L 07/29/2018, 1:44 PM  [place] MAIN [facility] SERVICES [address] [place], [place], [address] Phone: [phone]   Fax:  [phone]  Name: [name] MRN: [id number] Date of Birth: [birth year]

## 2018-07-30 DIAGNOSIS — M25512 Pain in left shoulder: Secondary | ICD-10-CM | POA: Diagnosis not present

## 2018-08-03 ENCOUNTER — Other Ambulatory Visit (HOSPITAL_COMMUNITY): Payer: Self-pay | Admitting: Rehabilitation

## 2018-08-03 ENCOUNTER — Ambulatory Visit: Payer: BLUE CROSS/BLUE SHIELD | Admitting: Occupational Therapy

## 2018-08-03 DIAGNOSIS — M25532 Pain in left wrist: Secondary | ICD-10-CM | POA: Diagnosis not present

## 2018-08-03 DIAGNOSIS — M25512 Pain in left shoulder: Secondary | ICD-10-CM

## 2018-08-04 ENCOUNTER — Ambulatory Visit: Payer: BLUE CROSS/BLUE SHIELD

## 2018-08-04 ENCOUNTER — Telehealth: Payer: Self-pay

## 2018-08-04 ENCOUNTER — Encounter: Payer: Self-pay | Admitting: Family Medicine

## 2018-08-04 ENCOUNTER — Ambulatory Visit (INDEPENDENT_AMBULATORY_CARE_PROVIDER_SITE_OTHER): Payer: BLUE CROSS/BLUE SHIELD | Admitting: Family Medicine

## 2018-08-04 VITALS — BP 126/62 | HR 72 | Temp 97.6°F | Ht 62.0 in | Wt 142.5 lb

## 2018-08-04 DIAGNOSIS — R42 Dizziness and giddiness: Secondary | ICD-10-CM | POA: Diagnosis not present

## 2018-08-04 NOTE — Telephone Encounter (Signed)
For one wk each morning when wakes up has dizziness, pt lays back down and when gets back up dizziness is gone. No H/a, SOB,CP and no changes in vision. Pt has tightness in lt side of chest when tries to raise arm since CVA 02/2019. Pt said she is sure related to muscles and not heart issue. Pt advised can make FU appt about tight muscles if needed. Pt voiced understanding. FYI to Dr Einar Pheasant.

## 2018-08-04 NOTE — Progress Notes (Signed)
   Subjective:     [name] is a 63 y.o. female presenting for Dizziness (x 1 week. Usually in the morning when she tries to get up. This subsides and does not bother her that much during the day. No syncope. Does not chekc her b/p.)     Dizziness  This is a new problem. The current episode started 1 to 4 weeks ago. The problem occurs daily (morning). The problem has been unchanged. Associated symptoms include coughing and fatigue. Pertinent negatives include no abdominal pain, chills, diaphoresis, fever, headaches or urinary symptoms. The symptoms are aggravated by standing.   Drinking: drinks soda (2 bottles/cans), water (2-3, 8oz serving of water), and Orange Juice  Review of Systems  Constitutional: Positive for fatigue. Negative for chills, diaphoresis and fever.  Respiratory: Positive for cough.   Gastrointestinal: Negative for abdominal pain.  Neurological: Positive for dizziness. Negative for headaches.     Social History   Tobacco Use  Smoking Status Never Smoker  Smokeless Tobacco Never Used        Objective:    BP Readings from Last 3 Encounters:  08/04/18 126/62  07/27/18 116/69  07/21/18 120/84   Wt Readings from Last 3 Encounters:  08/04/18 142 lb 8 oz (64.6 kg)  07/27/18 142 lb 3.2 oz (64.5 kg)  07/21/18 140 lb 4 oz (63.6 kg)    BP 126/62   Pulse 72   Temp 97.6 F (36.4 C)   Ht 5' 2"  (1.575 m)   Wt 142 lb 8 oz (64.6 kg)   SpO2 100%   BMI 26.06 kg/m   Orthostatic VS Supine 130/82, HR 73 Sitting 126/88, HR 74 Standing 126/84, HR 78   Physical Exam Constitutional:      General: She is not in acute distress.    Appearance: She is well-developed. She is not diaphoretic.  HENT:     Right Ear: External ear normal.     Left Ear: External ear normal.     Nose: Nose normal.  Eyes:     Conjunctiva/sclera: Conjunctivae normal.  Neck:     Musculoskeletal: Neck supple.  Cardiovascular:     Rate and Rhythm: Normal rate and regular  rhythm.     Heart sounds: No murmur.  Pulmonary:     Effort: Pulmonary effort is normal. No respiratory distress.     Breath sounds: Normal breath sounds. No wheezing.  Skin:    General: Skin is warm and dry.     Capillary Refill: Capillary refill takes less than 2 seconds.  Neurological:     Mental Status: She is alert. Mental status is at baseline.  Psychiatric:        Mood and Affect: Mood normal.        Behavior: Behavior normal.           Assessment & Plan:   Problem List Items Addressed This Visit      Other   Postural dizziness - Primary    Orthostatic vitals are normal. Recommended slow transitions and increased hydration as likely a factor. If not improving recommend follow-up in 1 week to consider further work-up.           Return if symptoms worsen or fail to improve.  Lesleigh Noe, MD

## 2018-08-04 NOTE — Patient Instructions (Signed)
Orthostatic Hypotension 1) Drink more water -- try to get 6, 8oz glasses a day 2) Consider reducing the amount of caffeine you have as this can dehydrate you 3) Criss - Cross legs in bed 10-15 times before you get out of bed.  4) Sit on the side of the bed for 3-5 minutes before standing up   If symptoms are continuing or worsening return to see Shannon Fenton, NP to discuss if medication changes would be helpful   Orthostatic Hypotension Blood pressure is a measurement of how strongly, or weakly, your blood is pressing against the walls of your arteries. Orthostatic hypotension is a sudden drop in blood pressure that happens when you quickly change positions, such as when you get up from sitting or lying down. Arteries are blood vessels that carry blood from your heart throughout your body. When blood pressure is too low, you may not get enough blood to your brain or to the rest of your organs. This can cause weakness, light-headedness, rapid heartbeat, and fainting. This can last for just a few seconds or for up to a few minutes. Orthostatic hypotension is usually not a serious problem. However, if it happens frequently or gets worse, it may be a sign of something more serious. What are the causes? This condition may be caused by:  Sudden changes in posture, such as standing up quickly after you have been sitting or lying down.  Blood loss.  Loss of body fluids (dehydration).  Heart problems.  Hormone (endocrine) problems.  Pregnancy.  Severe infection.  Lack of certain nutrients.  Severe allergic reactions (anaphylaxis).  Certain medicines, such as blood pressure medicine or medicines that make the body lose excess fluids (diuretics). Sometimes, this condition can be caused by not taking medicine as directed, such as taking too much of a certain medicine. What increases the risk? The following factors may make you more likely to develop this condition:  Age. Risk increases  as you get older.  Conditions that affect the heart or the central nervous system.  Taking certain medicines, such as blood pressure medicine or diuretics.  Being pregnant. What are the signs or symptoms? Symptoms of this condition may include:  Weakness.  Light-headedness.  Dizziness.  Blurred vision.  Fatigue.  Rapid heartbeat.  Fainting, in severe cases. How is this diagnosed? This condition is diagnosed based on:  Your medical history.  Your symptoms.  Your blood pressure measurement. Your health care provider will check your blood pressure when you are: ? Lying down. ? Sitting. ? Standing. A blood pressure reading is recorded as two numbers, such as "120 over 80" (or 120/80). The first ("top") number is called the systolic pressure. It is a measure of the pressure in your arteries as your heart beats. The second ("bottom") number is called the diastolic pressure. It is a measure of the pressure in your arteries when your heart relaxes between beats. Blood pressure is measured in a unit called mm Hg. Healthy blood pressure for most adults is 120/80. If your blood pressure is below 90/60, you may be diagnosed with hypotension. Other information or tests that may be used to diagnose orthostatic hypotension include:  Your other vital signs, such as your heart rate and temperature.  Blood tests.  Tilt table test. For this test, you will be safely secured to a table that moves you from a lying position to an upright position. Your heart rhythm and blood pressure will be monitored during the test. How is  this treated? This condition may be treated by:  Changing your diet. This may involve eating more salt (sodium) or drinking more water.  Taking medicines to raise your blood pressure.  Changing the dosage of certain medicines you are taking that might be lowering your blood pressure.  Wearing compression stockings. These stockings help to prevent blood clots and  reduce swelling in your legs. In some cases, you may need to go to the hospital for:  Fluid replacement. This means you will receive fluids through an IV.  Blood replacement. This means you will receive donated blood through an IV (transfusion).  Treating an infection or heart problems, if this applies.  Monitoring. You may need to be monitored while medicines that you are taking wear off. Follow these instructions at home: Eating and drinking   Drink enough fluid to keep your urine pale yellow.  Eat a healthy diet, and follow instructions from your health care provider about eating or drinking restrictions. A healthy diet includes: ? Fresh fruits and vegetables. ? Whole grains. ? Lean meats. ? Low-fat dairy products.  Eat extra salt only as directed. Do not add extra salt to your diet unless your health care provider told you to do that.  Eat frequent, small meals.  Avoid standing up suddenly after eating. Medicines  Take over-the-counter and prescription medicines only as told by your health care provider. ? Follow instructions from your health care provider about changing the dosage of your current medicines, if this applies. ? Do not stop or adjust any of your medicines on your own. General instructions   Wear compression stockings as told by your health care provider.  Get up slowly from lying down or sitting positions. This gives your blood pressure a chance to adjust.  Avoid hot showers and excessive heat as directed by your health care provider.  Return to your normal activities as told by your health care provider. Ask your health care provider what activities are safe for you.  Do not use any products that contain nicotine or tobacco, such as cigarettes, e-cigarettes, and chewing tobacco. If you need help quitting, ask your health care provider.  Keep all follow-up visits as told by your health care provider. This is important. Contact a health care provider if  you:  Vomit.  Have diarrhea.  Have a fever for more than 2-3 days.  Feel more thirsty than usual.  Feel weak and tired. Get help right away if you:  Have chest pain.  Have a fast or irregular heartbeat.  Develop numbness in any part of your body.  Cannot move your arms or your legs.  Have trouble speaking.  Become sweaty or feel light-headed.  Faint.  Feel short of breath.  Have trouble staying awake.  Feel confused. Summary  Orthostatic hypotension is a sudden drop in blood pressure that happens when you quickly change positions.  Orthostatic hypotension is usually not a serious problem.  It is diagnosed by having your blood pressure taken lying down, sitting, and then standing.  It may be treated by changing your diet or adjusting your medicines. This information is not intended to replace advice given to you by your health care provider. Make sure you discuss any questions you have with your health care provider. Document Released: 06/20/2002 Document Revised: 12/24/2017 Document Reviewed: 12/24/2017 Elsevier Interactive Patient Education  [employer].

## 2018-08-04 NOTE — Assessment & Plan Note (Signed)
Orthostatic vitals are normal. Recommended slow transitions and increased hydration as likely a factor. If not improving recommend follow-up in 1 week to consider further work-up.

## 2018-08-05 ENCOUNTER — Ambulatory Visit: Payer: BLUE CROSS/BLUE SHIELD | Admitting: Occupational Therapy

## 2018-08-05 ENCOUNTER — Encounter: Payer: Self-pay | Admitting: Occupational Therapy

## 2018-08-05 DIAGNOSIS — M6281 Muscle weakness (generalized): Secondary | ICD-10-CM

## 2018-08-05 DIAGNOSIS — R278 Other lack of coordination: Secondary | ICD-10-CM | POA: Diagnosis not present

## 2018-08-05 NOTE — Therapy (Signed)
[place] MAIN [facility] SERVICES [address] [place], [place], [address] Phone: [phone]   Fax:  [phone]  Occupational Therapy Treatment  Patient Details  Name: [name] MRN: [id number] Date of Birth: 10/03/1955 No data recorded  Encounter Date: 08/05/2018  OT End of Session - 08/05/18 1152    Visit Number  23    Number of Visits  24    Date for OT Re-Evaluation  09/15/18    Authorization Type  Progress report period starting 05/20/2018    OT Start Time  1017    OT Stop Time  1100    OT Time Calculation (min)  43 min    Activity Tolerance  Patient tolerated treatment well    Behavior During Therapy  [facility] for tasks assessed/performed       Past Medical History:  Diagnosis Date  . Asthma   . Diabetes mellitus without complication ([place]) 8-54-62   hx. NIDDM-dx. 3 weeks ago  . Hyperlipidemia    hx  . Seasonal allergies   . Stroke ([place])   . Ulcerative colitis [facility])     Past Surgical History:  Procedure Laterality Date  . BUNIONECTOMY     bilateral and toe nails of big toes removed  . CESAREAN SECTION  1997/2006  . CHOLECYSTECTOMY N/A 12/01/2012   Procedure: LAPAROSCOPIC CHOLECYSTECTOMY;  Surgeon: Stark Klein, MD;  Location: [facility] ORS;  Service: General;  Laterality: N/A;  . COLONOSCOPY W/ POLYPECTOMY    . EYE SURGERY Bilateral 2013  . TONSILLECTOMY      There were no vitals filed for this visit.  Subjective Assessment - 08/05/18 1149    Subjective   Pt.  reports singing in the choir this past weekend.   Pertinent History  Pt is a 63 y.o. female presenting with LUE weakness and decreased [place] as a result of R basal ganglia CVA on 8/08. Pt was hospitalized for 1 week where she received inpatient therapy and was then discharged home where she received home health therapy for 2 weeks. Prior to CVA, pt was independent for ADLs and IADLs. Currently, pt requires increased assistance, increased time, and/or modified strategies for  ADLs and IADLs.     Patient Stated Goals  Pt wants to be able to do everything with her L hand that her R hand can do, including making wigs, picking up laundry baskets, manipulating coins for shopping, and grooming hair.    Currently in Pain?  Yes    Pain Score  --   Not rated    Pain Orientation  Left    Pain Descriptors / Indicators  Aching    Pain Type  Chronic pain;Acute pain    Aggravating Factors   LUE movement    Multiple Pain Sites  Yes    Pain Orientation  Left    Pain Descriptors / Indicators  Aching    Pain Type  Chronic pain;Acute pain    Pain Radiating Towards  left wrist, and left digit extension initially with MP, PIP, and DIP flexion.      OT TREATMENT    Neuro muscular re-education:  Pt. worked on grasping 1" resistive cubes alternating thumb opposition to the tip of the 2nd digit while the board is placed at a vertical angle. Pt. worked on pressing the cubes back into place using 2nd digit extension. Pt. worked on grasping 1/4" small pegs. And picking them up using a 2 point grasp.    Therapeutic Exercise:  Pt. worked on  intrinsic AROMs tretches for fisting with left hand. Pt. required verbal cues, and visual demonstration for proper technique. Digit flexion to the [facility]: 2nd digit: 3cm, 3rd digit: 2cm, 4th:1com, and 5th digit:1cm pt. Pt. has long fingernails. Pt. Is doing table to [facility] shoulder exercises at home.                       OT Education - 08/05/18 1152    Education Details  Left hand [place]    Comprehension  Verbalized understanding;Verbal cues required          OT Long Term Goals - 07/15/18 1247      OT LONG TERM GOAL #1   Title  Pt will improve [place] by 10 secs of speed in order to independently manipulate coins during grocery shopping    Baseline  07/15/2018: [facility] skills are improving. Pt. continues to have difficulty manipulating objects, and coins.    Time  12    Period  Weeks    Status  On-going    Target Date  09/15/18       OT LONG TERM GOAL #2   Title  Pt. will perform light homemaking tasks with modified independence using energy conservation/work simplification techniques as needed.    Baseline  07/15/2018: Pt. continues to have difficulty with home management tasks.    Time  12    Period  Weeks    Status  On-going    Target Date  09/15/18      OT LONG TERM GOAL #3   Title  Pt will perform UE ADL tasks with modified independence    Baseline  07/15/2018: Pt. continues to be limited    Time  12    Period  Weeks    Status  On-going    Target Date  09/15/18      OT LONG TERM GOAL #4   Title  Pt. will perform hair/wig care with modified indpendence using compensatory strategies.     Baseline  06/23/2018: Pt. conitnues to have difficulty performing hair/wig care    Time  12    Period  Weeks    Status  On-going    Target Date  09/15/18      OT LONG TERM GOAL #5   Title  Pt. will manipulate small buttons with modified independence.    Baseline  06/24/2019: Pt conitnues to require increased time to complete buttoning.    Time  12    Period  Weeks    Status  On-going    Target Date  09/15/18            Plan - 08/05/18 1159    Clinical Impression Statement  Pt. reports having to go back for a 3rd attempt at an MRI. Pt. was unable to tolerate it secondary to left shoulder pain. Pt.'s new appointment is the beginning of February. Pt. reports that they will have to put her to sleep to do it. Pt. also reports that she had a follow up appointment for her wrist, and reports there is nothing more that they can do for it. Pt. is planning to seek a second opinion. Left shoulder pain, and wrist pain are limiting factors. Pt. reports that she is now able to hold a glass,or cup in her left hand, however is not able to hold a full cup, or one with handles. Pt. has a follow-up appointment with her orthopedic MD following it, as well as an appointment for a PT  evaluation.    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 Pt enjoys singing for leisure however has not been able to do so since experiencing CVA.    Occupational performance deficits (Please refer to evaluation for details):  ADL's;IADL's;Leisure    Rehab Potential  Good    Current Impairments/barriers affecting progress:  Positive: age, motivate, family suppory; Negative:     OT Frequency  2x / week    OT Duration  12 weeks    OT Treatment/Interventions  Self-care/ADL training;Therapeutic exercise;Neuromuscular education;Therapeutic activities;Manual Therapy;Passive range of motion    Clinical Decision Making  Several treatment options, min-mod task modification necessary    Consulted and Agree with Plan of Care  Patient       Patient will benefit from skilled therapeutic intervention in order to improve the following deficits and impairments:  Pain, Decreased cognition, Decreased range of motion, Decreased strength, Impaired perceived functional ability, Impaired UE functional use  Visit Diagnosis: Muscle weakness (generalized)  Other lack of coordination    Problem List Patient Active Problem List   Diagnosis Date Noted  . Postural dizziness 08/04/2018  . Migraine 02/23/2018  . HIV disease ([place])   . Stroke (cerebrum) ([place]) - R basal ganglia d/t small vessel dz 02/18/2018  . HLD (hyperlipidemia) 08/02/2014  . Diabetes mellitus type 2, uncontrolled, with complications ([place]) 02/63/7858  . Ulcerative colitis ([place]) 06/26/2008  . Depression, recurrent ([place]) 04/04/2008    Harrel Carina, MS, OTR/L 08/05/2018, 12:20 PM  [place] MAIN [facility] SERVICES [address] [place], [place], [address] Phone: [phone]   Fax:  [phone]  Name: [name] MRN: [id number] Date of Birth: 1955/10/18

## 2018-08-10 ENCOUNTER — Encounter: Payer: Self-pay | Admitting: Occupational Therapy

## 2018-08-10 ENCOUNTER — Ambulatory Visit: Payer: BLUE CROSS/BLUE SHIELD | Admitting: Occupational Therapy

## 2018-08-10 DIAGNOSIS — M6281 Muscle weakness (generalized): Secondary | ICD-10-CM | POA: Diagnosis not present

## 2018-08-10 DIAGNOSIS — R278 Other lack of coordination: Secondary | ICD-10-CM | POA: Diagnosis not present

## 2018-08-10 NOTE — Therapy (Signed)
[place] MAIN [facility] SERVICES [address] [place], [place], [address] Phone: [phone]   Fax:  [phone]  Occupational Therapy Treatment  Patient Details  Name: Bryant Lipps MRN: [id number] Date of Birth: [birth year] No data recorded  Encounter Date: 08/10/2018  OT End of Session - 08/10/18 1549    Visit Number  24    Number of Visits  85    Date for OT Re-Evaluation  09/15/18    Authorization Type  Progress report period starting 05/20/2018    OT Start Time  1028    OT Stop Time  1100    OT Time Calculation (min)  32 min    Activity Tolerance  Patient tolerated treatment well    Behavior During Therapy  [facility] for tasks assessed/performed       Past Medical History:  Diagnosis Date  . Asthma   . Diabetes mellitus without complication ([place]) 6-96-29   hx. NIDDM-dx. 3 weeks ago  . Hyperlipidemia    hx  . Seasonal allergies   . Stroke ([place])   . Ulcerative colitis [facility])     Past Surgical History:  Procedure Laterality Date  . BUNIONECTOMY     bilateral and toe nails of big toes removed  . CESAREAN SECTION  1997/2006  . CHOLECYSTECTOMY N/A 12/01/2012   Procedure: LAPAROSCOPIC CHOLECYSTECTOMY;  Surgeon: Stark Klein, MD;  Location: [facility];  Service: General;  Laterality: N/A;  . COLONOSCOPY W/ POLYPECTOMY    . EYE SURGERY Bilateral 2013  . TONSILLECTOMY      There were no vitals filed for this visit.  Subjective Assessment - 08/10/18 1547    Subjective   Pt. reports she sang in the choir this weekend. Pt. had a lead part in the choir the previous weekend.    Pertinent History  Pt is a 63 y.o. female presenting with LUE weakness and decreased [place] as a result of R basal ganglia CVA on 8/08. Pt was hospitalized for 1 week where she received inpatient therapy and was then discharged home where she received home health therapy for 2 weeks. Prior to CVA, pt was independent for ADLs and IADLs. Currently, pt requires increased  assistance, increased time, and/or modified strategies for ADLs and IADLs.     Patient Stated Goals  Pt wants to be able to do everything with her L hand that her R hand can do, including making wigs, picking up laundry baskets, manipulating coins for shopping, and grooming hair.    Currently in Pain?  Yes    Pain Score  10-Worst pain ever    Pain Location  Shoulder    Pain Orientation  Left    Pain Descriptors / Indicators  Aching      OT TREATMENT    Neuro muscular re-education:  Pt. worked on left hand [facility] skills grasping, and manipulating1/2" washers, and worked on placing them one a horzontal dowel. Pt. worked on grasping 1/2" coins from yellow theraputty. Pt. was able to retrieve all coins, and press them back into place without difficulty.   Therapeutic Exercise:  Pt. worked on intrinsic stretches formulating a fist. Pt. Requires visual demonstration, and cues for proper technique.  Pt. Tolerated moist heat pack to the left shoulder while working on [facility] skills at the table.                      OT Education - 08/10/18 1552    Education Details  Left hand [facility]  Person(s) Educated  Patient    Methods  Explanation;Verbal cues;Demonstration    Comprehension  Verbalized understanding;Verbal cues required          OT Long Term Goals - 07/15/18 1247      OT LONG TERM GOAL #1   Title  Pt will improve [place] by 10 secs of speed in order to independently manipulate coins during grocery shopping    Baseline  07/15/2018: [facility] skills are improving. Pt. continues to have difficulty manipulating objects, and coins.    Time  12    Period  Weeks    Status  On-going    Target Date  09/15/18      OT LONG TERM GOAL #2   Title  Pt. will perform light homemaking tasks with modified independence using energy conservation/work simplification techniques as needed.    Baseline  07/15/2018: Pt. continues to have difficulty with home management tasks.    Time  12    Period   Weeks    Status  On-going    Target Date  09/15/18      OT LONG TERM GOAL #3   Title  Pt will perform UE ADL tasks with modified independence    Baseline  07/15/2018: Pt. continues to be limited    Time  12    Period  Weeks    Status  On-going    Target Date  09/15/18      OT LONG TERM GOAL #4   Title  Pt. will perform hair/wig care with modified indpendence using compensatory strategies.     Baseline  06/23/2018: Pt. conitnues to have difficulty performing hair/wig care    Time  12    Period  Weeks    Status  On-going    Target Date  09/15/18      OT LONG TERM GOAL #5   Title  Pt. will manipulate small buttons with modified independence.    Baseline  06/24/2019: Pt conitnues to require increased time to complete buttoning.    Time  12    Period  Weeks    Status  On-going    Target Date  09/15/18            Plan - 08/10/18 1550    Clinical Impression Statement  Pt. reports that she woke up this morning with greater than 10/10 pain in her left shoulder. Pt. reports that it made her cry, and that she almost did not make it to therapy today. Pt. was late for the session. Pt. has a 3rd attempt at an MRI scheduled for next week. Pt. also has and orthopedic surgeon follow-up appointment, and a PT evaluation next week. Pt. left shoulder pain is a barrier to function. Pt. is able to tolerate OT treatment for [facility] skills in order to be able to manipulate buttons, and small objects for ADLs, and IADLs.    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 Pt enjoys singing for leisure however has not been able to do so since experiencing CVA.    Occupational performance deficits (Please refer to evaluation for details):  ADL's;IADL's;Leisure    Rehab Potential  Good    Current Impairments/barriers affecting progress:  Positive: age, motivate, family suppory;  Negative:     OT Frequency  2x / week    OT Duration  12 weeks    OT Treatment/Interventions  Self-care/ADL training;Therapeutic exercise;Neuromuscular education;Therapeutic  activities;Manual Therapy;Passive range of motion    Clinical Decision Making  Several treatment options, min-mod task modification necessary    Consulted and Agree with Plan of Care  Patient       Patient will benefit from skilled therapeutic intervention in order to improve the following deficits and impairments:  Pain, Decreased cognition, Decreased range of motion, Decreased strength, Impaired perceived functional ability, Impaired UE functional use  Visit Diagnosis: Muscle weakness (generalized)  Other lack of coordination    Problem List Patient Active Problem List   Diagnosis Date Noted  . Postural dizziness 08/04/2018  . Migraine 02/23/2018  . HIV disease ([place])   . Stroke (cerebrum) ([place]) - R basal ganglia d/t small vessel dz 02/18/2018  . HLD (hyperlipidemia) 08/02/2014  . Diabetes mellitus type 2, uncontrolled, with complications ([place]) 94/03/501  . Ulcerative colitis ([place]) 06/26/2008  . Depression, recurrent ([place]) 04/04/2008    Harrel Carina, MS, OTR/L 08/10/2018, 5:07 PM  [place] MAIN [facility] SERVICES [address] [place], [place], [address] Phone: [phone]   Fax:  [phone]  Name: Retaj Hilbun MRN: [id number] Date of Birth: [birth year]

## 2018-08-11 ENCOUNTER — Telehealth: Payer: Self-pay | Admitting: Pharmacy Technician

## 2018-08-11 NOTE — Telephone Encounter (Signed)
[name] called and has a large copay.  Got a copay card ID [id number] B [id number] P ACCESS G [id number]

## 2018-08-12 ENCOUNTER — Ambulatory Visit: Payer: BLUE CROSS/BLUE SHIELD | Admitting: Occupational Therapy

## 2018-08-12 ENCOUNTER — Telehealth: Payer: Self-pay | Admitting: Internal Medicine

## 2018-08-12 DIAGNOSIS — M792 Neuralgia and neuritis, unspecified: Secondary | ICD-10-CM

## 2018-08-12 DIAGNOSIS — I639 Cerebral infarction, unspecified: Secondary | ICD-10-CM

## 2018-08-12 DIAGNOSIS — I6381 Other cerebral infarction due to occlusion or stenosis of small artery: Secondary | ICD-10-CM

## 2018-08-12 NOTE — Telephone Encounter (Signed)
Pt stated her pharmacy has changed to [facility] for a 90 day supply [phone]    Need refills  Glipizide 10 mg  Atorvastatin  40 mg  topiramate  61m  Potassium chloride 20 meq

## 2018-08-16 MED ORDER — POTASSIUM CHLORIDE CRYS ER 20 MEQ PO TBCR: 20.0000 meq | EXTENDED_RELEASE_TABLET | Freq: Every day | ORAL | 0 refills | Status: DC

## 2018-08-16 MED ORDER — ATORVASTATIN CALCIUM 40 MG PO TABS: 40.0000 mg | ORAL_TABLET | Freq: Every day | ORAL | 0 refills | Status: DC

## 2018-08-16 MED ORDER — GLIPIZIDE 10 MG PO TABS: ORAL_TABLET | ORAL | 0 refills | Status: DC

## 2018-08-16 MED ORDER — TOPIRAMATE 100 MG PO TABS: 100.0000 mg | ORAL_TABLET | Freq: Every evening | ORAL | 0 refills | Status: DC | PRN

## 2018-08-16 NOTE — Telephone Encounter (Signed)
I do not see where pt is on diuretic and last 2 labs were normal...Marland Kitchen please advise  other Rx sent through [facility]

## 2018-08-16 NOTE — Telephone Encounter (Signed)
I believe she has chronic hypokalemia, not related to diuretic use. Ok to send refills.

## 2018-08-16 NOTE — Addendum Note (Signed)
Addended by: Lurlean Nanny on: 08/16/2018 04:59 PM   Modules accepted: Orders

## 2018-08-17 ENCOUNTER — Ambulatory Visit: Payer: BLUE CROSS/BLUE SHIELD | Attending: Internal Medicine | Admitting: Occupational Therapy

## 2018-08-17 DIAGNOSIS — M6281 Muscle weakness (generalized): Secondary | ICD-10-CM | POA: Insufficient documentation

## 2018-08-17 DIAGNOSIS — M25612 Stiffness of left shoulder, not elsewhere classified: Secondary | ICD-10-CM | POA: Insufficient documentation

## 2018-08-17 DIAGNOSIS — R278 Other lack of coordination: Secondary | ICD-10-CM | POA: Insufficient documentation

## 2018-08-17 DIAGNOSIS — G8929 Other chronic pain: Secondary | ICD-10-CM | POA: Insufficient documentation

## 2018-08-17 DIAGNOSIS — M25512 Pain in left shoulder: Secondary | ICD-10-CM | POA: Insufficient documentation

## 2018-08-18 ENCOUNTER — Ambulatory Visit: Payer: Self-pay

## 2018-08-18 DIAGNOSIS — M25512 Pain in left shoulder: Secondary | ICD-10-CM | POA: Diagnosis not present

## 2018-08-19 ENCOUNTER — Encounter: Payer: Self-pay | Admitting: Occupational Therapy

## 2018-08-19 ENCOUNTER — Other Ambulatory Visit: Payer: Self-pay

## 2018-08-19 ENCOUNTER — Ambulatory Visit: Payer: BLUE CROSS/BLUE SHIELD | Admitting: Occupational Therapy

## 2018-08-19 ENCOUNTER — Ambulatory Visit: Payer: BLUE CROSS/BLUE SHIELD

## 2018-08-19 DIAGNOSIS — G8929 Other chronic pain: Secondary | ICD-10-CM | POA: Diagnosis not present

## 2018-08-19 DIAGNOSIS — R278 Other lack of coordination: Secondary | ICD-10-CM

## 2018-08-19 DIAGNOSIS — M25612 Stiffness of left shoulder, not elsewhere classified: Secondary | ICD-10-CM

## 2018-08-19 DIAGNOSIS — M25512 Pain in left shoulder: Principal | ICD-10-CM

## 2018-08-19 DIAGNOSIS — M6281 Muscle weakness (generalized): Secondary | ICD-10-CM | POA: Diagnosis not present

## 2018-08-19 NOTE — Therapy (Signed)
[place] MAIN [facility] SERVICES [address] [place], [place], [address] Phone: [phone]   Fax:  [phone]  Occupational Therapy Treatment  Patient Details  Name: [name] MRN: [id number] Date of Birth: 26-Jun-1956 No data recorded  Encounter Date: 08/19/2018  OT End of Session - 08/19/18 1511    Visit Number  25    Number of Visits  95    Date for OT Re-Evaluation  09/15/18    Authorization Type  Progress report period starting 05/20/2018    OT Start Time  1018    OT Stop Time  1100    OT Time Calculation (min)  42 min    Activity Tolerance  Patient tolerated treatment well    Behavior During Therapy  [facility] for tasks assessed/performed;Anxious       Past Medical History:  Diagnosis Date  . Asthma   . Diabetes mellitus without complication ([place]) 8-34-19   hx. NIDDM-dx. 3 weeks ago  . Hyperlipidemia    hx  . Seasonal allergies   . Stroke ([place])   . Ulcerative colitis [facility])     Past Surgical History:  Procedure Laterality Date  . BUNIONECTOMY     bilateral and toe nails of big toes removed  . CESAREAN SECTION  1997/2006  . CHOLECYSTECTOMY N/A 12/01/2012   Procedure: LAPAROSCOPIC CHOLECYSTECTOMY;  Surgeon: Stark Klein, MD;  Location: [facility];  Service: General;  Laterality: N/A;  . COLONOSCOPY W/ POLYPECTOMY    . EYE SURGERY Bilateral 2013  . TONSILLECTOMY      There were no vitals filed for this visit.  Subjective Assessment - 08/19/18 1509    Subjective   Pt. continues to sing in the church this weekend.     Pertinent History  Pt is a 63 y.o. female presenting with LUE weakness and decreased [place] as a result of R basal ganglia CVA on 8/08. Pt was hospitalized for 1 week where she received inpatient therapy and was then discharged home where she received home health therapy for 2 weeks. Prior to CVA, pt was independent for ADLs and IADLs. Currently, pt requires increased assistance, increased time, and/or modified  strategies for ADLs and IADLs.     Patient Stated Goals  Pt wants to be able to do everything with her L hand that her R hand can do, including making wigs, picking up laundry baskets, manipulating coins for shopping, and grooming hair.    Currently in Pain?  Yes    Pain Score  5     Pain Location  Shoulder    Pain Orientation  Left      OT TREATMENT    Neuro muscular re-education:  Pt. worked on left hand [place] skills grasping 1/2 inch flat marbles using a 2pt. grasp.  Pt. worked on grasping one at a time, and storing them in the palm of her hand. Pt. worked on increasing the amount of marbles that she was able to store at a time in the palm of her hand. Pt. worked on grasping one Public librarian at a time, and sliding sliding it off the edge of an elevated surface to the tip of her 2nd digit, and thumb. Pt. was positioned upright in a chair on a pillow to improve height of the chair when working at the tabletop surface. Pt. tolerated the session well.   There. Ex:  Pt. performed intrinsic stretches for formulating a fist. Pt. required cues for the proper technique.  Pt. worked on Engineer, mining  strengthening in the left hand for lateral, and 3pt. pinch using yellow resistive clips. Pt. worked on placing the clips at various vertical and horizontal angles. Tactile and verbal cues were required for eliciting the desired movement. Pt. tolerated this well.                          OT Education - 08/19/18 1511    Education Details  Left hand [facility]    Person(s) Educated  Patient    Methods  Explanation;Verbal cues;Demonstration    Comprehension  Verbalized understanding;Verbal cues required          OT Long Term Goals - 07/15/18 1247      OT LONG TERM GOAL #1   Title  Pt will improve [place] by 10 secs of speed in order to independently manipulate coins during grocery shopping    Baseline  07/15/2018: [facility] skills are improving. Pt. continues to have difficulty manipulating objects, and  coins.    Time  12    Period  Weeks    Status  On-going    Target Date  09/15/18      OT LONG TERM GOAL #2   Title  Pt. will perform light homemaking tasks with modified independence using energy conservation/work simplification techniques as needed.    Baseline  07/15/2018: Pt. continues to have difficulty with home management tasks.    Time  12    Period  Weeks    Status  On-going    Target Date  09/15/18      OT LONG TERM GOAL #3   Title  Pt will perform UE ADL tasks with modified independence    Baseline  07/15/2018: Pt. continues to be limited    Time  12    Period  Weeks    Status  On-going    Target Date  09/15/18      OT LONG TERM GOAL #4   Title  Pt. will perform hair/wig care with modified indpendence using compensatory strategies.     Baseline  06/23/2018: Pt. conitnues to have difficulty performing hair/wig care    Time  12    Period  Weeks    Status  On-going    Target Date  09/15/18      OT LONG TERM GOAL #5   Title  Pt. will manipulate small buttons with modified independence.    Baseline  06/24/2019: Pt conitnues to require increased time to complete buttoning.    Time  12    Period  Weeks    Status  On-going    Target Date  09/15/18            Plan - 08/19/18 1511    Clinical Impression Statement Pt. reports that she found out she has to go to [facility] for her MRI at the end of February, and was not able to have it performed in [place] yesterday. Pt. reports having had her orthopedic follow-up appointment.  Pt. had an initial evaluation this morning with a PT shoulder specialist. Pt. continues to work on improving left hand motor control, and [facility] skills in order to be able to improve hand function during ADL, and IADL tasks.    Occupational performance deficits (Please refer to evaluation for details):  ADL's;IADL's;Leisure    Rehab Potential  Good    Current Impairments/barriers affecting progress:  Positive: age, motivate, family suppory;  Negative:     OT Frequency  2x / week  OT Duration  12 weeks    OT Treatment/Interventions  Self-care/ADL training;Therapeutic exercise;Neuromuscular education;Therapeutic activities;Manual Therapy;Passive range of motion    Clinical Decision Making  Several treatment options, min-mod task modification necessary    Consulted and Agree with Plan of Care  Patient       Patient will benefit from skilled therapeutic intervention in order to improve the following deficits and impairments:  Pain, Decreased cognition, Decreased range of motion, Decreased strength, Impaired perceived functional ability, Impaired UE functional use  Visit Diagnosis: Other lack of coordination    Problem List Patient Active Problem List   Diagnosis Date Noted  . Postural dizziness 08/04/2018  . Migraine 02/23/2018  . HIV disease ([place])   . Stroke (cerebrum) ([place]) - R basal ganglia d/t small vessel dz 02/18/2018  . HLD (hyperlipidemia) 08/02/2014  . Diabetes mellitus type 2, uncontrolled, with complications ([place]) 67/54/4920  . Ulcerative colitis ([place]) 06/26/2008  . Depression, recurrent ([place]) 04/04/2008    Harrel Carina, MS, OTR/L 08/19/2018, 3:22 PM  [place] MAIN [facility] SERVICES [address] [place], [place], [address] Phone: [phone]   Fax:  [phone]  Name: [name] MRN: [id number] Date of Birth: 09/01/1955

## 2018-08-19 NOTE — Patient Instructions (Signed)
Access Code: [email or web address]  URL: [email or web address]  Date: 08/19/2018  Prepared by: Janna Arch   Exercises  Seated Flexion Stretch with Swiss Ball - 10 reps - 2 sets - 5 hold - 1x daily - 7x weekly  Seated Scapular Retraction - 10 reps - 2 sets - 5 hold - 1x daily - 7x weekly

## 2018-08-19 NOTE — Therapy (Signed)
[place] MAIN [facility] SERVICES [address] [place], [place], [address] Phone: [phone]   Fax:  [phone]  Physical Therapy Evaluation  Patient Details  Name: [name] MRN: [id number] Date of Birth: 02-25-1956 Referring Provider (PT): Webb Silversmith   Encounter Date: 08/19/2018  PT End of Session - 08/19/18 1047    Visit Number  1    Number of Visits  16    Date for PT Re-Evaluation  10/14/18    Authorization Type  1/20 visits allowed PT    Authorization Time Period  1/10 eval 2/6     PT Start Time  0916    PT Stop Time  1010    PT Time Calculation (min)  54 min    Activity Tolerance  Patient limited by pain    Behavior During Therapy  [facility] for tasks assessed/performed;Anxious       Past Medical History:  Diagnosis Date  . Asthma   . Diabetes mellitus without complication ([place]) 10-23-85   hx. NIDDM-dx. 3 weeks ago  . Hyperlipidemia    hx  . Seasonal allergies   . Stroke ([place])   . Ulcerative colitis [facility])     Past Surgical History:  Procedure Laterality Date  . BUNIONECTOMY     bilateral and toe nails of big toes removed  . CESAREAN SECTION  1997/2006  . CHOLECYSTECTOMY N/A 12/01/2012   Procedure: LAPAROSCOPIC CHOLECYSTECTOMY;  Surgeon: Stark Klein, MD;  Location: [facility];  Service: General;  Laterality: N/A;  . COLONOSCOPY W/ POLYPECTOMY    . EYE SURGERY Bilateral 2013  . TONSILLECTOMY      There were no vitals filed for this visit.   Subjective Assessment - 08/19/18 1024    Subjective  Patient is a pleasant 63 year old female who presents for L shoulder pain and limited ROM s/p R basal ganglia CVA 8/08.    Pertinent History  Patient is a pleasant 63 year old female who presents for L shoulder pain and limited ROM s/p R basal ganglia CVA 8/08. Pt was hospitalized for 1 week where she received inpatient therapy and was then discharged home where she received home health therapy for 2 weeks. PMH includes: ulcerative  colitis, stroke, HLD, DM, asthma.  Prior to CVA, pt was independent for ADLs and IADLs. Patient reports she has been having pain in L shoulder since CVA and is unable to lift arm to perform ADL/iADLs such as dressing, doing hair/makeup. Etc. Reports pain is sore/throbbing. Per patient report X ray and nerve testing report were clear    Limitations  Sitting;Lifting;Walking;House hold activities;Writing;Other (comment)    How long can you sit comfortably?  supports L arm with R hand    How long can you stand comfortably?  n/a    How long can you walk comfortably?  limited swing of L arm    Diagnostic tests  X ray, nerve conduction test, getting MRI on 09/09/18    Patient Stated Goals  reduce pain, improve ROM and strength to be able to reach into counter, do hair and makeup, tie shoes.     Currently in Pain?  Yes    Pain Score  8     Pain Location  Shoulder    Pain Orientation  Left    Pain Descriptors / Indicators  Aching    Pain Type  Chronic pain;Acute pain    Pain Radiating Towards  anterior portion of arm down to elbow    Pain Onset  More than a month ago    Pain Frequency  Intermittent    Aggravating Factors   LUE movement, reaching, dressing    Pain Relieving Factors  moist heat/shower    Effect of Pain on Daily Activities  limits all ADL/iADL            PAIN: Current pain: 8/10 Worst pain: 10/10 Pulsing, anterior aspect of GH joint Best: 6-7/10 (during warm shower)  POSTURE: Seated: R arm supporting L UE. Slight anterior rotation of L shoulder with hike and L head tilt  Standing: L arm tucked into body, anterior shift with protracted scapula  PROM/AROM: Standing AROM Right Left  Shoulder Flexion 155 50 with elbow bent *  Shoulder Abduction 134 24 with elbow bent*   ER WFL, T4; Unable to perform due to pain *  IR T8 Unable to perform due to pain *  *painful  PROM SUPINE Right Left  Shoulder Flexion WFL 38 with arm straight *  Shoulder Abduction WFL 28 with arm  straight *  ER WFL -10 with arm at side *  IR WFL limited ability to assess*      Joint Mobilizations/Glides: Glenohumeral AP/PA: empty due to pain; Glenohumeral inferior: relieved pain   STRENGTH:  Graded on a 0-5 scale Muscle Group Right Left  Shoulder flex 4+/5 2+/5  Shoulder Abd 4+/5 2+/5  Shoulder Ext 4+/5 2+/5  Elbow 4+/5 flexion, 4+/5 flexion 3/5 flexion/extension; painful   SENSATION/ Palpation: Pulsing/heavy in LUE s/p CVA Painful to touch: hypertensive: potential early stage allodynia  -excessive tightness and pain to anterior aspect of shoulder and bicep short and long head as well as pectoral region.    SPECIAL TESTS: Unable to perform due to limited ROM  FUNCTIONAL MOBILITY: Painful in supine: requires pillow under L elbow to decrease pain and make position tolerable.  OUTCOME MEASURES: TEST Outcome Interpretation  QuickDash 93.2% High level of perceived disability                          [facility] PT Assessment - 08/19/18 1016      Assessment   Medical Diagnosis  CVA     Referring Provider (PT)  Webb Silversmith    Onset Date/Surgical Date  02/18/18    Hand Dominance  Right    Next MD Visit  --   unsure   Prior Therapy  HH PT, OT, SLP      Precautions   Precautions  None    Required Braces or Orthoses  --   no longer wearing hand brace     Restrictions   Weight Bearing Restrictions  No    Other Position/Activity Restrictions  painful in supine without adjustements (pillows)      Balance Screen   Has the patient fallen in the past 6 months  No    Has the patient had a decrease in activity level because of a fear of falling?   Yes    Is the patient reluctant to leave their home because of a fear of falling?   No      Home Environment   Living Environment  Private residence    Living Arrangements  Spouse/significant other    Available Help at Discharge  Family    Type of [place] Access  Level entry    [place]  Two level     Alternate Level Stairs-Number of Steps  --   standard floor  levels   Alternate Level Stairs-Rails  Left   left going up, right coming down   Home Equipment  None      Prior Function   Level of Independence  Independent    Vocation  Full time employment    Vocation Requirements  Typing, using phone    Leisure  [place]      Cognition   Overall Cognitive Status  History of cognitive impairments - at baseline    Area of Impairment  Memory;Attention;Problem solving    Memory  Decreased short-term memory       TREAT:  Review and perform HEP for demonstration of understanding  Access Code: [email or web address]  URL: [email or web address]  Date: 08/19/2018  Prepared by: Janna Arch   Exercises  Seated Flexion Stretch with Swiss Ball - 10 reps - 2 sets - 5 hold - 1x daily - 7x weekly  Seated Scapular Retraction - 10 reps - 2 sets - 5 hold - 1x daily - 7x weekly        Objective measurements completed on examination: See above findings.              PT Education - 08/19/18 1047    Education Details  goals, POC, HEP     Person(s) Educated  Patient    Methods  Explanation;Demonstration;Tactile cues;Verbal cues;Handout    Comprehension  Verbal cues required;Returned demonstration;Verbalized understanding;Tactile cues required;Need further instruction       PT Short Term Goals - 08/19/18 1101      PT SHORT TERM GOAL #1   Title  Patient will be independent in home exercise program to improve strength/mobility for better functional independence with ADLs.    Baseline  2/6: HEP given     Time  2    Period  Weeks    Status  New    Target Date  09/02/18      PT SHORT TERM GOAL #2   Title  Patient will report a worst pain of 8/10 on VAS in L shoulder to improve tolerance with ADLs and reduced symptoms with activities.     Baseline  2/6: 10/10 pain    Time  2    Period  Weeks    Status  New    Target Date  09/02/18        PT Long Term Goals - 08/19/18 1102       PT LONG TERM GOAL #1   Title  Patient will report a worst pain of 4/10 on VAS in L shoulder to improve tolerance with ADLs and reduced symptoms with activities.     Baseline  2/6: 10/10     Time  8    Period  Weeks    Status  New    Target Date  10/14/18      PT LONG TERM GOAL #2   Title  Patient will improve shoulder AROM to > 115 degrees of flexion  and abduction for improved ability to perform overhead activities.    Baseline  2/6: AROM: flexion 50degrees with elbow bent, 24 with elbow bent (unable to straighten arm)     Time  8    Period  Weeks    Status  New    Target Date  10/14/18      PT LONG TERM GOAL #3   Title  Patient will decrease Quick DASH score by > 8 points (to <85%) demonstrating reduced self-reported upper extremity disability.    Baseline  2/6: 93.2%  Time  8    Period  Weeks    Status  New    Target Date  10/14/18      PT LONG TERM GOAL #4   Title  Patient will demonstrate adequate shoulder ROM and strength (>3+/5) to be able to dress, perform hair care, tie shoes, and don undergarments.     Baseline  2/6: unable to use LUE for any ADLs such as dressing    Time  8    Period  Weeks    Status  New    Target Date  10/14/18             Plan - 08/19/18 1050    Clinical Impression Statement  Patient is a pleasant 63 year old female who presents for L shoulder pain, weakness, and limited ROM s/p CVA (02/2017). Patient presents with limited AROM and PROM with protective muscle tightness limiting available range and increasing pain with movement attempt. Due to patient's prolonged pain and immobility muscle tissue length of surrounding tissues is limited and made assessment of bony mobility challenging and painful. At this time patient will benefit from skilled physical therapy to decrease pain, improve ROM to functional levels, and increase strength to perform ADLs/iADLs.     History and Personal Factors relevant to plan of care:  This patient presents with  3, personal factors/ comorbidities, and  4  body elements including body structures and functions, activity limitations and or participation restrictions. Patient's condition is  evolving,    Clinical Presentation  Evolving    Clinical Presentation due to:  small predicitable fluctuations of pain, progressive pain and limitations of movement in the past year     Clinical Decision Making  Moderate    Rehab Potential  Fair    Clinical Impairments Affecting Rehab Potential  (+) motivated, external support system, age/fitness prior to CVA (-) chronicity, severity of pain, no MRI/known etiology    PT Frequency  2x / week    PT Duration  8 weeks    PT Treatment/Interventions  ADLs/Self Care Home Management;Aquatic Therapy;Biofeedback;Cryotherapy;Electrical Stimulation;Iontophoresis 16m/ml Dexamethasone;Moist Heat;Traction;Ultrasound;Functional mobility training;Therapeutic activities;Therapeutic exercise;Patient/family education;Neuromuscular re-education;Manual techniques;Energy conservation;Passive range of motion;Compression bandaging;Splinting;Taping    PT Next Visit Plan  review HEP, STM to musculature, PROM, pain control    PT Home Exercise Plan  see above    Recommended Other Services  already seeing OT.     Consulted and Agree with Plan of Care  Patient       Patient will benefit from skilled therapeutic intervention in order to improve the following deficits and impairments:  Decreased activity tolerance, Decreased coordination, Decreased endurance, Decreased mobility, Decreased range of motion, Decreased strength, Increased fascial restricitons, Hypomobility, Increased muscle spasms, Impaired perceived functional ability, Impaired flexibility, Impaired UE functional use, Impaired sensation, Improper body mechanics, Postural dysfunction, Pain  Visit Diagnosis: Chronic left shoulder pain  Muscle weakness (generalized)  Stiffness of left shoulder, not elsewhere classified     Problem  List Patient Active Problem List   Diagnosis Date Noted  . Postural dizziness 08/04/2018  . Migraine 02/23/2018  . HIV disease ([place]   . Stroke (cerebrum) ([place] - R basal ganglia d/t small vessel dz 02/18/2018  . HLD (hyperlipidemia) 08/02/2014  . Diabetes mellitus type 2, uncontrolled, with complications ([place] 016/04/9603 . Ulcerative colitis ([place] 06/26/2008  . Depression, recurrent ([place] 04/04/2008   MJanna Arch PT, DPT   08/19/2018, 11:10 AM  [place] [facility] [address]  [place], [place], [address] Phone: [phone]   Fax:  [phone]  Name: [name] MRN: [id number] Date of Birth: 05/14/1956

## 2018-08-24 ENCOUNTER — Ambulatory Visit: Payer: BLUE CROSS/BLUE SHIELD

## 2018-08-24 ENCOUNTER — Encounter: Payer: Self-pay | Admitting: Occupational Therapy

## 2018-08-24 ENCOUNTER — Ambulatory Visit: Payer: BLUE CROSS/BLUE SHIELD | Admitting: Occupational Therapy

## 2018-08-24 DIAGNOSIS — M6281 Muscle weakness (generalized): Secondary | ICD-10-CM | POA: Diagnosis not present

## 2018-08-24 DIAGNOSIS — R278 Other lack of coordination: Secondary | ICD-10-CM

## 2018-08-24 DIAGNOSIS — M25512 Pain in left shoulder: Principal | ICD-10-CM

## 2018-08-24 DIAGNOSIS — M25612 Stiffness of left shoulder, not elsewhere classified: Secondary | ICD-10-CM

## 2018-08-24 DIAGNOSIS — G8929 Other chronic pain: Secondary | ICD-10-CM

## 2018-08-24 NOTE — Therapy (Signed)
[place] MAIN [facility] SERVICES [address] [place], [place], [address] Phone: [phone]   Fax:  [phone]  Physical Therapy Treatment  Patient Details  Name: [name] MRN: [id number] Date of Birth: 1956-05-22 Referring Provider (PT): Webb Silversmith   Encounter Date: 08/24/2018  PT End of Session - 08/24/18 1012    Visit Number  2    Number of Visits  16    Date for PT Re-Evaluation  10/14/18    Authorization Type  2/20 visits allowed PT    Authorization Time Period  2/10 eval 2/6     PT Start Time  0921    PT Stop Time  1001    PT Time Calculation (min)  40 min    Activity Tolerance  Patient limited by pain    Behavior During Therapy  [facility] for tasks assessed/performed;Anxious       Past Medical History:  Diagnosis Date  . Asthma   . Diabetes mellitus without complication ([place]) 1-32-44   hx. NIDDM-dx. 3 weeks ago  . Hyperlipidemia    hx  . Seasonal allergies   . Stroke ([place])   . Ulcerative colitis [facility])     Past Surgical History:  Procedure Laterality Date  . BUNIONECTOMY     bilateral and toe nails of big toes removed  . CESAREAN SECTION  1997/2006  . CHOLECYSTECTOMY N/A 12/01/2012   Procedure: LAPAROSCOPIC CHOLECYSTECTOMY;  Surgeon: Stark Klein, MD;  Location: [facility];  Service: General;  Laterality: N/A;  . COLONOSCOPY W/ POLYPECTOMY    . EYE SURGERY Bilateral 2013  . TONSILLECTOMY      There were no vitals filed for this visit.  Subjective Assessment - 08/24/18 1008    Subjective  Patient arrived late to session limiting session duration. Has been compliant with HEP but is noting neurological tingling in hand.     Pertinent History  Patient is a pleasant 63 year old female who presents for L shoulder pain and limited ROM s/p R basal ganglia CVA 8/08. Pt was hospitalized for 1 week where she received inpatient therapy and was then discharged home where she received home health therapy for 2 weeks. PMH includes:  ulcerative colitis, stroke, HLD, DM, asthma.  Prior to CVA, pt was independent for ADLs and IADLs. Patient reports she has been having pain in L shoulder since CVA and is unable to lift arm to perform ADL/iADLs such as dressing, doing hair/makeup. Etc. Reports pain is sore/throbbing. Per patient report X ray and nerve testing report were clear    Limitations  Sitting;Lifting;Walking;House hold activities;Writing;Other (comment)    How long can you sit comfortably?  supports L arm with R hand    How long can you stand comfortably?  n/a    How long can you walk comfortably?  limited swing of L arm    Diagnostic tests  X ray, nerve conduction test, getting MRI on 09/09/18    Patient Stated Goals  reduce pain, improve ROM and strength to be able to reach into counter, do hair and makeup, tie shoes.     Currently in Pain?  Yes    Pain Location  Shoulder    Pain Orientation  Left    Pain Descriptors / Indicators  Aching    Pain Type  Chronic pain;Acute pain    Pain Onset  More than a month ago    Pain Frequency  Intermittent        Ultrasound : small head travel  2.1 with small head, warming cycle 9 minutes to anterior aspect of shoulder and pectoral and bicep heads. Utilized for pain reduction with patient educated on ultrasound at that time.   Passive ROM: patient unable to relax enough to allow physical therapist to provide manual range, tensing prior to movement resulting in severe pain indicating fear of pain cycle.   AAROM with UE ranger: Placing Right hand over left to guide left hand into the following movements for pain reduction.   Flexion/extension: forward movement with slow increase in range. x2 minutes  Abduction/adduction cross body: slow increase in range: 3 minutes  Small circle: clockwise 3 minutes, counterclockwise 4 minutes due to slower velocity  STM to bicep tendon and pectoral region x 6 minutes.    Scapular retractions 10x.   Patient educated on purse lipped breathing  technique for pain reduction, intermittently thorughout session.   Frequent rest breaks required as well as heat pad and positioning of pillow for pain reduction.                       PT Education - 08/24/18 1011    Education Details  ultrasound, AAROM     Person(s) Educated  Patient    Methods  Explanation;Demonstration;Tactile cues;Verbal cues    Comprehension  Returned demonstration;Verbalized understanding;Verbal cues required;Tactile cues required;Need further instruction       PT Short Term Goals - 08/19/18 1101      PT SHORT TERM GOAL #1   Title  Patient will be independent in home exercise program to improve strength/mobility for better functional independence with ADLs.    Baseline  2/6: HEP given     Time  2    Period  Weeks    Status  New    Target Date  09/02/18      PT SHORT TERM GOAL #2   Title  Patient will report a worst pain of 8/10 on VAS in L shoulder to improve tolerance with ADLs and reduced symptoms with activities.     Baseline  2/6: 10/10 pain    Time  2    Period  Weeks    Status  New    Target Date  09/02/18        PT Long Term Goals - 08/19/18 1102      PT LONG TERM GOAL #1   Title  Patient will report a worst pain of 4/10 on VAS in L shoulder to improve tolerance with ADLs and reduced symptoms with activities.     Baseline  2/6: 10/10     Time  8    Period  Weeks    Status  New    Target Date  10/14/18      PT LONG TERM GOAL #2   Title  Patient will improve shoulder AROM to > 115 degrees of flexion  and abduction for improved ability to perform overhead activities.    Baseline  2/6: AROM: flexion 50degrees with elbow bent, 24 with elbow bent (unable to straighten arm)     Time  8    Period  Weeks    Status  New    Target Date  10/14/18      PT LONG TERM GOAL #3   Title  Patient will decrease Quick DASH score by > 8 points (to <85%) demonstrating reduced self-reported upper extremity disability.    Baseline  2/6:  93.2%    Time  8    Period  Weeks  Status  New    Target Date  10/14/18      PT LONG TERM GOAL #4   Title  Patient will demonstrate adequate shoulder ROM and strength (>3+/5) to be able to dress, perform hair care, tie shoes, and don undergarments.     Baseline  2/6: unable to use LUE for any ADLs such as dressing    Time  8    Period  Weeks    Status  New    Target Date  10/14/18            Plan - 08/24/18 1016    Clinical Impression Statement  Patient's session limited by late arrival and fera of pain cycle resulting in tension of muscle prior to movement creating a painful self fulfilling cycle. Patient is less fearful allowing more motion when she performs an activity with AAROM having right UE lead left. At this time patient will benefit from skilled physical therapy to decrease pain, improve ROM to functional levels, and increase strength to perform ADLs/iADLs.     Rehab Potential  Fair    Clinical Impairments Affecting Rehab Potential  (+) motivated, external support system, age/fitness prior to CVA (-) chronicity, severity of pain, no MRI/known etiology    PT Frequency  2x / week    PT Duration  8 weeks    PT Treatment/Interventions  ADLs/Self Care Home Management;Aquatic Therapy;Biofeedback;Cryotherapy;Electrical Stimulation;Iontophoresis 27m/ml Dexamethasone;Moist Heat;Traction;Ultrasound;Functional mobility training;Therapeutic activities;Therapeutic exercise;Patient/family education;Neuromuscular re-education;Manual techniques;Energy conservation;Passive range of motion;Compression bandaging;Splinting;Taping    PT Next Visit Plan  review HEP, STM to musculature, PROM, pain control    PT Home Exercise Plan  see above    Consulted and Agree with Plan of Care  Patient       Patient will benefit from skilled therapeutic intervention in order to improve the following deficits and impairments:  Decreased activity tolerance, Decreased coordination, Decreased endurance,  Decreased mobility, Decreased range of motion, Decreased strength, Increased fascial restricitons, Hypomobility, Increased muscle spasms, Impaired perceived functional ability, Impaired flexibility, Impaired UE functional use, Impaired sensation, Improper body mechanics, Postural dysfunction, Pain  Visit Diagnosis: Chronic left shoulder pain  Muscle weakness (generalized)  Stiffness of left shoulder, not elsewhere classified  Other lack of coordination     Problem List Patient Active Problem List   Diagnosis Date Noted  . Postural dizziness 08/04/2018  . Migraine 02/23/2018  . HIV disease ([facility]   . Stroke (cerebrum) ([place] - R basal ganglia d/t small vessel dz 02/18/2018  . HLD (hyperlipidemia) 08/02/2014  . Diabetes mellitus type 2, uncontrolled, with complications ([place] 060/73/7106 . Ulcerative colitis ([place] 06/26/2008  . Depression, recurrent ([place] 04/04/2008   MJanna Arch PT, DPT    08/24/2018, 10:20 AM   [facility]SERV[facility] [address] [address]Phone: [phone]  Fax:  [phone] Name: Shannon PrimeauMRN: [id number] of Birth: [birth year]

## 2018-08-24 NOTE — Therapy (Addendum)
[place] MAIN [facility] SERVICES [address] [place], [place], [address] Phone: [phone]   Fax:  [phone]  Occupational Therapy Treatment  Patient Details  Name: [name] MRN: [id number] Date of Birth: 23-Mar-1956 No data recorded  Encounter Date: 08/24/2018  OT End of Session - 08/24/18 1110    Visit Number  26    Number of Visits  38    Date for OT Re-Evaluation  09/15/18    Authorization Type  Progress report period starting 05/20/2018    OT Start Time  1017    OT Stop Time  1055    OT Time Calculation (min)  38 min    Activity Tolerance  Patient tolerated treatment well    Behavior During Therapy  [facility] for tasks assessed/performed;Anxious       Past Medical History:  Diagnosis Date  . Asthma   . Diabetes mellitus without complication ([place]) 4-94-49   hx. NIDDM-dx. 3 weeks ago  . Hyperlipidemia    hx  . Seasonal allergies   . Stroke ([place])   . Ulcerative colitis [facility])     Past Surgical History:  Procedure Laterality Date  . BUNIONECTOMY     bilateral and toe nails of big toes removed  . CESAREAN SECTION  1997/2006  . CHOLECYSTECTOMY N/A 12/01/2012   Procedure: LAPAROSCOPIC CHOLECYSTECTOMY;  Surgeon: Stark Klein, MD;  Location: [facility];  Service: General;  Laterality: N/A;  . COLONOSCOPY W/ POLYPECTOMY    . EYE SURGERY Bilateral 2013  . TONSILLECTOMY      There were no vitals filed for this visit.  Subjective Assessment - 08/24/18 1108    Subjective   Pt. reports that she has been singing in church each weekend, however has not done any solos.     Pertinent History  Pt is a 63 y.o. female presenting with LUE weakness and decreased [place] as a result of R basal ganglia CVA on 8/08. Pt was hospitalized for 1 week where she received inpatient therapy and was then discharged home where she received home health therapy for 2 weeks. Prior to CVA, pt was independent for ADLs and IADLs. Currently, pt requires increased  assistance, increased time, and/or modified strategies for ADLs and IADLs.     Patient Stated Goals  Pt wants to be able to do everything with her L hand that her R hand can do, including making wigs, picking up laundry baskets, manipulating coins for shopping, and grooming hair.    Currently in Pain?  Yes    Pain Score  5     Pain Location  Shoulder    Pain Orientation  Left    Pain Onset  More than a month ago       OT TREATMENT    Neuro muscular re-education:  Pt. worked on left hand [facility] skills grasping and manipulating 2" sticks, and placing them onto a pegboard. Pt. dropped multiple sticks from the left hand. Pt. worked on using a yellow resistive clip to remove the sticks from the pegboard. Pt. was able to remove 1/3 of the sticks. Pt. required verbal cues for clip position. Pt. worked on isolating her 2nd digit to slide the coins off a raised edge of a board placed at the tabletop to the tip of her thumb for 2pt pinch/grasp. Pt. worked on [employer] worked from larger to [employer] ending with [place]. Pt. worked on left hand [facility] skills to grasp 1/2" washers from a magnetic dish using  2pt, grasp. Pt. attempted to grasp and store the washers in her hand, however was unable to hold them. Pt. tolerated [place] tasks well today, however did require positioning with a pillow on the chair to increase her height, and decrease distance to the tabletop surface. Pt. required a pillow on her lap for additional support of her LUE during [facility] tasks. Pt. tolerated heat to her left shoulder.   Therapeutic Exercise:  Pt. worked on gentle AROM formulating a fist using intrinsic stretches. Pt. required visual demonstration, and cues for proper form for each each exercise.                          OT Education - 08/24/18 1110    Education Details  Left hand [place]    Person(s) Educated  Patient    Methods  Explanation;Verbal cues;Demonstration    Comprehension  Verbalized  understanding;Verbal cues required          OT Long Term Goals - 07/15/18 1247      OT LONG TERM GOAL #1   Title  Pt will improve [place] by 10 secs of speed in order to independently manipulate coins during grocery shopping    Baseline  07/15/2018: [facility] skills are improving. Pt. continues to have difficulty manipulating objects, and coins.    Time  12    Period  Weeks    Status  On-going    Target Date  09/15/18      OT LONG TERM GOAL #2   Title  Pt. will perform light homemaking tasks with modified independence using energy conservation/work simplification techniques as needed.    Baseline  07/15/2018: Pt. continues to have difficulty with home management tasks.    Time  12    Period  Weeks    Status  On-going    Target Date  09/15/18      OT LONG TERM GOAL #3   Title  Pt will perform UE ADL tasks with modified independence    Baseline  07/15/2018: Pt. continues to be limited    Time  12    Period  Weeks    Status  On-going    Target Date  09/15/18      OT LONG TERM GOAL #4   Title  Pt. will perform hair/wig care with modified indpendence using compensatory strategies.     Baseline  06/23/2018: Pt. conitnues to have difficulty performing hair/wig care    Time  12    Period  Weeks    Status  On-going    Target Date  09/15/18      OT LONG TERM GOAL #5   Title  Pt. will manipulate small buttons with modified independence.    Baseline  06/24/2019: Pt conitnues to require increased time to complete buttoning.    Time  12    Period  Weeks    Status  On-going    Target Date  09/15/18            Plan - 08/24/18 1111    Clinical Impression Statement  Pt. reports that she has been engaging her LUE during laundry tasks over this past weekend, and used it when doing dishes. Pt. reports doing alot of laundry, and may have over done it this weekend, but reports the work had to get done. Pt.'s left shoulder pain limits overall progress.     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 Pt enjoys singing for leisure however has not been able to do so since experiencing CVA.    Occupational performance deficits (Please refer to evaluation for details):  ADL's;IADL's;Leisure    Rehab Potential  Good    Current Impairments/barriers affecting progress:  Positive: age, motivate, family suppory; Negative:     OT Frequency  2x / week    OT Duration  12 weeks    OT Treatment/Interventions  Self-care/ADL training;Therapeutic exercise;Neuromuscular education;Therapeutic activities;Manual Therapy;Passive range of motion    Clinical Decision Making  Several treatment options, min-mod task modification necessary    Consulted and Agree with Plan of Care  Patient       Patient will benefit from skilled therapeutic intervention in order to improve the following deficits and impairments:  Pain, Decreased cognition, Decreased range of motion, Decreased strength, Impaired perceived functional ability, Impaired UE functional use  Visit Diagnosis: Muscle weakness (generalized)  Other lack of coordination    Problem List Patient Active Problem List   Diagnosis Date Noted  . Postural dizziness 08/04/2018  . Migraine 02/23/2018  . HIV disease ([place])   . Stroke (cerebrum) ([place]) - R basal ganglia d/t small vessel dz 02/18/2018  . HLD (hyperlipidemia) 08/02/2014  . Diabetes mellitus type 2, uncontrolled, with complications ([place]) 88/28/0034  . Ulcerative colitis ([place]) 06/26/2008  . Depression, recurrent ([place]) 04/04/2008    Harrel Carina, MS, OTR/L 08/24/2018, 3:24 PM  [place] MAIN [facility] SERVICES [address] [place], [place], [address] Phone: [phone]   Fax:  [phone]  Name: [name] MRN: [id number] Date of Birth: May 27, 1956

## 2018-08-25 NOTE — Addendum Note (Signed)
Addended by: Lurlean Nanny on: 08/25/2018 02:11 PM   Modules accepted: Orders

## 2018-08-26 ENCOUNTER — Ambulatory Visit: Payer: BLUE CROSS/BLUE SHIELD | Admitting: Occupational Therapy

## 2018-08-26 ENCOUNTER — Encounter: Payer: Self-pay | Admitting: Occupational Therapy

## 2018-08-26 ENCOUNTER — Ambulatory Visit: Payer: BLUE CROSS/BLUE SHIELD

## 2018-08-26 DIAGNOSIS — M25612 Stiffness of left shoulder, not elsewhere classified: Secondary | ICD-10-CM

## 2018-08-26 DIAGNOSIS — G8929 Other chronic pain: Secondary | ICD-10-CM

## 2018-08-26 DIAGNOSIS — M6281 Muscle weakness (generalized): Secondary | ICD-10-CM

## 2018-08-26 DIAGNOSIS — R278 Other lack of coordination: Secondary | ICD-10-CM | POA: Diagnosis not present

## 2018-08-26 DIAGNOSIS — M25512 Pain in left shoulder: Secondary | ICD-10-CM

## 2018-08-26 NOTE — Therapy (Signed)
[place] MAIN [facility] SERVICES [address] [place], [place], [address] Phone: [phone]   Fax:  [phone]  Occupational Therapy Treatment  Patient Details  Name: [name] MRN: [id number] Date of Birth: 21-Apr-1956 No data recorded  Encounter Date: 08/26/2018  OT End of Session - 08/26/18 1503    Visit Number  27    Number of Visits  71    Date for OT Re-Evaluation  09/15/18    Authorization Type  Progress report period starting 05/20/2018    OT Start Time  1018    OT Stop Time  1100    OT Time Calculation (min)  42 min    Activity Tolerance  Patient tolerated treatment well    Behavior During Therapy  [facility] for tasks assessed/performed;Anxious       Past Medical History:  Diagnosis Date  . Asthma   . Diabetes mellitus without complication ([place]) 0-09-23   hx. NIDDM-dx. 3 weeks ago  . Hyperlipidemia    hx  . Seasonal allergies   . Stroke ([place])   . Ulcerative colitis [facility])     Past Surgical History:  Procedure Laterality Date  . BUNIONECTOMY     bilateral and toe nails of big toes removed  . CESAREAN SECTION  1997/2006  . CHOLECYSTECTOMY N/A 12/01/2012   Procedure: LAPAROSCOPIC CHOLECYSTECTOMY;  Surgeon: Stark Klein, MD;  Location[facility];  Service: General;  Laterality: N/A;  . COLONOSCOPY W/ POLYPECTOMY    . EYE SURGERY Bilateral 2013  . TONSILLECTOMY      There were no vitals filed for this visit.  Subjective Assessment - 08/26/18 1502    Subjective   Pt. reports not sleeping well at night.    Pertinent History  Pt is a 63 y.o. female presenting with LUE weakness and decreased [place] as a result of R basal ganglia CVA on 8/08. Pt was hospitalized for 1 week where she received inpatient therapy and was then discharged home where she received home health therapy for 2 weeks. Prior to CVA, pt was independent for ADLs and IADLs. Currently, pt requires increased assistance, increased time, and/or modified strategies for  ADLs and IADLs.     Patient Stated Goals  Pt wants to be able to do everything with her L hand that her R hand can do, including making wigs, picking up laundry baskets, manipulating coins for shopping, and grooming hair.    Currently in Pain?  Yes    Pain Score  8     Pain Location  Shoulder    Pain Orientation  Left    Pain Descriptors / Indicators  Aching    Pain Type  Chronic pain;Acute pain    Pain Onset  More than a month ago      OT TREATMENT    Neuro muscular re-education:  Pt. Worked on left hand [facility] skills grasping single wide clips using 2pt. Grasp with her thumb, and 2nd digit, and placing them onto a horizontal dowel. Pt. worked on manipulating  miniclips with the distal tip of her 2nd digit, and thumb placing them on the tabletop. Pt. Worked on grasping, and manipulating 1" resistive cubes from a  resistive board. Pt. worked on pressing them into place while alternating isolating her 2nd, and 3rd digits. Pt. Worked on flipping cards using a fingers on thumb movement pattern. Pt. Was unable to perform a thumb on fingers movement pattern. Pt. Worked on grasping small earring backings. Pt. Worked on sliding them off  of an elevated surface with her thumb to the tip of her thumb. Pt. Worked on bilateral hand coordination untying knots. Pt. Worked at the tabletop while sitting on a pillow to decrease table height distance, and her LUE supported on a pillow. Pt. Tolerated moist heat modality to her left shoulder during coordination tasks.  Therapeutic Exercise:  Pt. worked on gentle AROM formulating a fist using intrinsic stretches. Pt. required visual demonstration, and cues for proper form for each each exercise.                              OT Education - 08/26/18 1503    Education Details  Left hand [facility]    Person(s) Educated  Patient    Methods  Explanation;Verbal cues;Demonstration    Comprehension  Verbalized understanding;Verbal cues required           OT Long Term Goals - 07/15/18 1247      OT LONG TERM GOAL #1   Title  Pt will improve [place] by 10 secs of speed in order to independently manipulate coins during grocery shopping    Baseline  07/15/2018: [facility] skills are improving. Pt. continues to have difficulty manipulating objects, and coins.    Time  12    Period  Weeks    Status  On-going    Target Date  09/15/18      OT LONG TERM GOAL #2   Title  Pt. will perform light homemaking tasks with modified independence using energy conservation/work simplification techniques as needed.    Baseline  07/15/2018: Pt. continues to have difficulty with home management tasks.    Time  12    Period  Weeks    Status  On-going    Target Date  09/15/18      OT LONG TERM GOAL #3   Title  Pt will perform UE ADL tasks with modified independence    Baseline  07/15/2018: Pt. continues to be limited    Time  12    Period  Weeks    Status  On-going    Target Date  09/15/18      OT LONG TERM GOAL #4   Title  Pt. will perform hair/wig care with modified indpendence using compensatory strategies.     Baseline  06/23/2018: Pt. conitnues to have difficulty performing hair/wig care    Time  12    Period  Weeks    Status  On-going    Target Date  09/15/18      OT LONG TERM GOAL #5   Title  Pt. will manipulate small buttons with modified independence.    Baseline  06/24/2019: Pt conitnues to require increased time to complete buttoning.    Time  12    Period  Weeks    Status  On-going    Target Date  09/15/18            Plan - 08/26/18 1504    Clinical Impression Statement Pt. reports that shoulder pain interferes with her sleep at nigeporst that the medication she has been prescribed by her MD does not take the pain away. Pt. continues to present with limited left UE motor control, hand function, and [facility] skills. Pt. continues to work on improving left hand [facility] skills in order to be able to manipulate coins, button buttons, and perform  hair/wig care.     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 Pt enjoys singing for leisure however has not been able to do so since experiencing CVA.    Occupational performance deficits (Please refer to evaluation for details):  ADL's;IADL's;Leisure    Rehab Potential  Good    Current Impairments/barriers affecting progress:  Positive: age, motivate, family suppory; Negative:     OT Frequency  2x / week    OT Duration  12 weeks    OT Treatment/Interventions  Self-care/ADL training;Therapeutic exercise;Neuromuscular education;Therapeutic activities;Manual Therapy;Passive range of motion    Clinical Decision Making  Several treatment options, min-mod task modification necessary    Consulted and Agree with Plan of Care  Patient       Patient will benefit from skilled therapeutic intervention in order to improve the following deficits and impairments:  Pain, Decreased cognition, Decreased range of motion, Decreased strength, Impaired perceived functional ability, Impaired UE functional use  Visit Diagnosis: Muscle weakness (generalized)  Other lack of coordination    Problem List Patient Active Problem List   Diagnosis Date Noted  . Postural dizziness 08/04/2018  . Migraine 02/23/2018  . HIV disease ([place])   . Stroke (cerebrum) ([place]) - R basal ganglia d/t small vessel dz 02/18/2018  . HLD (hyperlipidemia) 08/02/2014  . Diabetes mellitus type 2, uncontrolled, with complications ([place]) 36/06/2448  . Ulcerative colitis ([place]) 06/26/2008  . Depression, recurrent ([place]) 04/04/2008    Harrel Carina, MS, OTR/L 08/26/2018, 3:15 PM  [place] MAIN [facility] SERVICES [address] [place], [place], [address] Phone: [phone]   Fax:  [phone]  Name: [name] MRN: [id number] Date of Birth: May 12, 1956

## 2018-08-26 NOTE — Therapy (Signed)
[place] MAIN [facility] SERVICES [address] [place], [place], [address] Phone: [phone]   Fax:  [phone]  Physical Therapy Treatment  Patient Details  Name: [name] MRN: [id number] Date of Birth: [birth year] Referring Provider (PT): Webb Silversmith   Encounter Date: 08/26/2018  PT End of Session - 08/26/18 1152    Visit Number  3    Number of Visits  16    Date for PT Re-Evaluation  10/14/18    Authorization Type  3/20 visits allowed PT    Authorization Time Period  3/10 eval 2/6     PT Start Time  1102    PT Stop Time  1143    PT Time Calculation (min)  41 min    Activity Tolerance  Patient limited by pain    Behavior During Therapy  [facility] for tasks assessed/performed;Anxious       Past Medical History:  Diagnosis Date  . Asthma   . Diabetes mellitus without complication ([place]) 7-79-39   hx. NIDDM-dx. 3 weeks ago  . Hyperlipidemia    hx  . Seasonal allergies   . Stroke ([place])   . Ulcerative colitis [facility])     Past Surgical History:  Procedure Laterality Date  . BUNIONECTOMY     bilateral and toe nails of big toes removed  . CESAREAN SECTION  1997/2006  . CHOLECYSTECTOMY N/A 12/01/2012   Procedure: LAPAROSCOPIC CHOLECYSTECTOMY;  Surgeon: Stark Klein, MD;  [facility];  Service: General;  Laterality: N/A;  . COLONOSCOPY W/ POLYPECTOMY    . EYE SURGERY Bilateral 2013  . TONSILLECTOMY      There were no vitals filed for this visit.  Subjective Assessment - 08/26/18 1151    Subjective  Patient reports she did not sleep well last night, having 8/10 pain last night and so was sore in the morning.     Pertinent History  Patient is a pleasant 63 year old female who presents for L shoulder pain and limited ROM s/p R basal ganglia CVA 8/08. Pt was hospitalized for 1 week where she received inpatient therapy and was then discharged home where she received home health therapy for 2 weeks. PMH includes: ulcerative colitis,  stroke, HLD, DM, asthma.  Prior to CVA, pt was independent for ADLs and IADLs. Patient reports she has been having pain in L shoulder since CVA and is unable to lift arm to perform ADL/iADLs such as dressing, doing hair/makeup. Etc. Reports pain is sore/throbbing. Per patient report X ray and nerve testing report were clear    Limitations  Sitting;Lifting;Walking;House hold activities;Writing;Other (comment)    How long can you sit comfortably?  supports L arm with R hand    How long can you stand comfortably?  n/a    How long can you walk comfortably?  limited swing of L arm    Diagnostic tests  X ray, nerve conduction test, getting MRI on 09/09/18    Patient Stated Goals  reduce pain, improve ROM and strength to be able to reach into counter, do hair and makeup, tie shoes.     Currently in Pain?  Yes    Pain Score  6     Pain Location  Shoulder    Pain Orientation  Left    Pain Descriptors / Indicators  Aching    Pain Type  Chronic pain;Acute pain    Pain Onset  More than a month ago    Pain Frequency  Intermittent  AAROM with UE ranger: Placing Right hand over left to guide left hand into the following movements for pain reduction. Verbal cueing for body mechanics, postioning of limbs for optimal movement and visual cueing for arc of movement range:              Flexion/extension: forward movement with slow increase in range. x2 minutes             Abduction/adduction cross body: slow increase in range: 3 minutes            circles: clockwise 3 minutes, counterclockwise 4 minutes due to slower velocity  Swiss ball flexion AAROM; seated in chair with forward flexion of UE with RUE leading LUE, verbal cueing for positioning. 10x 5 second holds  AAROM ER with dowel on lap: 10 x with assistance required to start back at neutral. Second trial performed increased movement due to concurrent scapular mobilizations.    STM to bicep tendon and pectoral region x 6 minutes. Gentle gradual  increase in pressure for muscle tissue lengthening due to limited patient tolerance.   Scapular mobilizations: retractions: 10x 5 second holds, 2 sets. Depression 10x 5 second holds, 3 sets. Pain relief reported by patient with repeated mobilizations   Scapular retractions 10x. Tactile cueing for body mechanics.    Patient educated on purse lipped breathing technique for pain reduction, intermittently thorughout session.  Frequent rest breaks required as well as heat pad and positioning of pillow for pain reduction  Patient is limited by fear of pain creating pain cycle of movement. Slight improvement noted with use of AAROM allowing patient to feel/be more in control reducing her pain. Scapula mobilizations additionally reduced pain. Pain by end of session 2/10. At this time patient will benefit from skilled physical therapy to decrease pain, improve ROM to functional levels, and increase strength to perform ADLs/iADLs.                       PT Education - 08/26/18 1101    Education Details  body mechanics AAROM    Person(s) Educated  Patient    Methods  Explanation;Demonstration;Tactile cues;Verbal cues    Comprehension  Verbalized understanding;Returned demonstration;Tactile cues required;Need further instruction;Verbal cues required       PT Short Term Goals - 08/19/18 1101      PT SHORT TERM GOAL #1   Title  Patient will be independent in home exercise program to improve strength/mobility for better functional independence with ADLs.    Baseline  2/6: HEP given     Time  2    Period  Weeks    Status  New    Target Date  09/02/18      PT SHORT TERM GOAL #2   Title  Patient will report a worst pain of 8/10 on VAS in L shoulder to improve tolerance with ADLs and reduced symptoms with activities.     Baseline  2/6: 10/10 pain    Time  2    Period  Weeks    Status  New    Target Date  09/02/18        PT Long Term Goals - 08/19/18 1102      PT LONG TERM  GOAL #1   Title  Patient will report a worst pain of 4/10 on VAS in L shoulder to improve tolerance with ADLs and reduced symptoms with activities.     Baseline  2/6: 10/10     Time  8  Period  Weeks    Status  New    Target Date  10/14/18      PT LONG TERM GOAL #2   Title  Patient will improve shoulder AROM to > 115 degrees of flexion  and abduction for improved ability to perform overhead activities.    Baseline  2/6: AROM: flexion 50degrees with elbow bent, 24 with elbow bent (unable to straighten arm)     Time  8    Period  Weeks    Status  New    Target Date  10/14/18      PT LONG TERM GOAL #3   Title  Patient will decrease Quick DASH score by > 8 points (to <85%) demonstrating reduced self-reported upper extremity disability.    Baseline  2/6: 93.2%    Time  8    Period  Weeks    Status  New    Target Date  10/14/18      PT LONG TERM GOAL #4   Title  Patient will demonstrate adequate shoulder ROM and strength (>3+/5) to be able to dress, perform hair care, tie shoes, and don undergarments.     Baseline  2/6: unable to use LUE for any ADLs such as dressing    Time  8    Period  Weeks    Status  New    Target Date  10/14/18              Patient will benefit from skilled therapeutic intervention in order to improve the following deficits and impairments:     Visit Diagnosis: Muscle weakness (generalized)  Other lack of coordination  Chronic left shoulder pain  Stiffness of left shoulder, not elsewhere classified     Problem List Patient Active Problem List   Diagnosis Date Noted  . Postural dizziness 08/04/2018  . Migraine 02/23/2018  . HIV disease ([place])   . Stroke (cerebrum) ([place]) - R basal ganglia d/t small vessel dz 02/18/2018  . HLD (hyperlipidemia) 08/02/2014  . Diabetes mellitus type 2, uncontrolled, with complications ([place]) 71/16/5790  . Ulcerative colitis ([place]) 06/26/2008  . Depression, recurrent ([place]) 04/04/2008    Janna Arch, PT,  DPT   08/26/2018, 12:19 PM  [place] MAIN [facility] SERVICES [address] [place], [place], [address] Phone: [phone]   Fax:  [phone]  Name: [name] MRN: [id number] Date of Birth: 11-18-1955

## 2018-08-30 ENCOUNTER — Ambulatory Visit: Payer: BLUE CROSS/BLUE SHIELD | Admitting: Occupational Therapy

## 2018-08-30 ENCOUNTER — Encounter: Payer: Self-pay | Admitting: Occupational Therapy

## 2018-08-30 ENCOUNTER — Ambulatory Visit: Payer: BLUE CROSS/BLUE SHIELD

## 2018-08-30 DIAGNOSIS — M25512 Pain in left shoulder: Secondary | ICD-10-CM

## 2018-08-30 DIAGNOSIS — G8929 Other chronic pain: Secondary | ICD-10-CM | POA: Diagnosis not present

## 2018-08-30 DIAGNOSIS — M25612 Stiffness of left shoulder, not elsewhere classified: Secondary | ICD-10-CM

## 2018-08-30 DIAGNOSIS — M6281 Muscle weakness (generalized): Secondary | ICD-10-CM

## 2018-08-30 DIAGNOSIS — R278 Other lack of coordination: Secondary | ICD-10-CM | POA: Diagnosis not present

## 2018-08-30 NOTE — Therapy (Signed)
[place] MAIN [facility] SERVICES [address] [place], [place], [address] Phone: [phone]   Fax:  [phone]  Physical Therapy Treatment  Patient Details  Name: [name] MRN: [id number] Date of Birth: 1956/02/23 Referring Provider (PT): Webb Silversmith   Encounter Date: 08/30/2018  PT End of Session - 08/30/18 1025    Visit Number  4    Number of Visits  16    Date for PT Re-Evaluation  10/14/18    Authorization Type  4/20 visits allowed PT    Authorization Time Period  4/10 eval 2/6     PT Start Time  0941    PT Stop Time  1015    PT Time Calculation (min)  34 min    Activity Tolerance  Patient limited by pain    Behavior During Therapy  [facility] for tasks assessed/performed       Past Medical History:  Diagnosis Date  . Asthma   . Diabetes mellitus without complication ([place]) 2-97-98   hx. NIDDM-dx. 3 weeks ago  . Hyperlipidemia    hx  . Seasonal allergies   . Stroke ([place])   . Ulcerative colitis [facility])     Past Surgical History:  Procedure Laterality Date  . BUNIONECTOMY     bilateral and toe nails of big toes removed  . CESAREAN SECTION  1997/2006  . CHOLECYSTECTOMY N/A 12/01/2012   Procedure: LAPAROSCOPIC CHOLECYSTECTOMY;  Surgeon: Stark Klein, MD;  Location: [facility];  Service: General;  Laterality: N/A;  . COLONOSCOPY W/ POLYPECTOMY    . EYE SURGERY Bilateral 2013  . TONSILLECTOMY      There were no vitals filed for this visit.  Subjective Assessment - 08/30/18 0943    Subjective  Patient reported that after her last PT session she was sore. Reported that she has 8/10 pain at start of session and that she is not getting any sleep due to pain and difficulty with positioning.    Pertinent History  Patient is a pleasant 63 year old female who presents for L shoulder pain and limited ROM s/p R basal ganglia CVA 8/08. Pt was hospitalized for 1 week where she received inpatient therapy and was then discharged home where she  received home health therapy for 2 weeks. PMH includes: ulcerative colitis, stroke, HLD, DM, asthma.  Prior to CVA, pt was independent for ADLs and IADLs. Patient reports she has been having pain in L shoulder since CVA and is unable to lift arm to perform ADL/iADLs such as dressing, doing hair/makeup. Etc. Reports pain is sore/throbbing. Per patient report X ray and nerve testing report were clear    Limitations  Sitting;Lifting;Walking;House hold activities;Writing;Other (comment)    How long can you sit comfortably?  supports L arm with R hand    How long can you stand comfortably?  n/a    How long can you walk comfortably?  limited swing of L arm    Diagnostic tests  X ray, nerve conduction test, getting MRI on 09/09/18    Currently in Pain?  Yes    Pain Score  8     Pain Location  Shoulder    Pain Orientation  Left    Pain Descriptors / Indicators  Aching;Sore    Pain Type  Chronic pain    Pain Orientation  --       TREATMENT:  Therapeutic exercises:  AAROM with UE ranger: Placing Right hand over left to guide left hand into the following movements  for pain reduction. Verbal cueing for body mechanics, postioning of limbs for optimal movement and visual/tactile cueing for arc of movement range. PT assist for horizontal abduction, ranger repositioned multiple times to increased ROM.             Flexion/extension: forward movement with slow increase in range. x2 minutes             Abduction/adduction cross body: slow increase in range: 3 minutes            circles: clockwise 3 minutes, counterclockwise 3 minutes due to slower velocity    AROM of supination and pronation in neutral positioning in sitting for tissue length 10-12x  Scapular retractions 10x. Tactile/verbal cueing for body mechanics.   Supine half foam roll pec and L shoulder stretch with PT assist, in varying positions, rhythmic mobilizations for relaxations, verbal cues. Improved ROM noted with time.   Manual therapy:   STM to bicep tendon and pectoral region x 10 minutes. Gentle gradual increase in pressure for muscle tissue lengthening due to limited patient tolerance.    Patient educated on purse lipped breathing technique for pain reduction, intermittently thorughout session.  Frequent rest breaks required    Patient with late arrival to session, shortened accordingly. Pt demonstrates significant tightness in L pec major/minor musculature due to muscle gaurding for increased pain, as well as tenderness of short head of bicep on coracoid process and long head of bicep tendon in bicipital groove. Mild improvement in bicep muscle tension after STM. Pt AROM/tolerance limited due to pain/fear of pain this session. Overall the patient would benefit from further skilled PT to continue to progress ROM, strength, and tolerance to activity with continued pain education/pain management techniques as well.    PT Education - 08/30/18 1006    Education Details  body mechanics, AAROM, relaxation    Person(s) Educated  Patient    Methods  Explanation    Comprehension  Verbalized understanding       PT Short Term Goals - 08/19/18 1101      PT SHORT TERM GOAL #1   Title  Patient will be independent in home exercise program to improve strength/mobility for better functional independence with ADLs.    Baseline  2/6: HEP given     Time  2    Period  Weeks    Status  New    Target Date  09/02/18      PT SHORT TERM GOAL #2   Title  Patient will report a worst pain of 8/10 on VAS in L shoulder to improve tolerance with ADLs and reduced symptoms with activities.     Baseline  2/6: 10/10 pain    Time  2    Period  Weeks    Status  New    Target Date  09/02/18        PT Long Term Goals - 08/19/18 1102      PT LONG TERM GOAL #1   Title  Patient will report a worst pain of 4/10 on VAS in L shoulder to improve tolerance with ADLs and reduced symptoms with activities.     Baseline  2/6: 10/10     Time  8    Period   Weeks    Status  New    Target Date  10/14/18      PT LONG TERM GOAL #2   Title  Patient will improve shoulder AROM to > 115 degrees of flexion  and abduction for improved  ability to perform overhead activities.    Baseline  2/6: AROM: flexion 50degrees with elbow bent, 24 with elbow bent (unable to straighten arm)     Time  8    Period  Weeks    Status  New    Target Date  10/14/18      PT LONG TERM GOAL #3   Title  Patient will decrease Quick DASH score by > 8 points (to <85%) demonstrating reduced self-reported upper extremity disability.    Baseline  2/6: 93.2%    Time  8    Period  Weeks    Status  New    Target Date  10/14/18      PT LONG TERM GOAL #4   Title  Patient will demonstrate adequate shoulder ROM and strength (>3+/5) to be able to dress, perform hair care, tie shoes, and don undergarments.     Baseline  2/6: unable to use LUE for any ADLs such as dressing    Time  8    Period  Weeks    Status  New    Target Date  10/14/18            Plan - 08/30/18 1022    Clinical Impression Statement  Patient with late arrival to session, shortened accordingly. Pt demonstrates significant tightness in L pec major/minor musculature due to muscle gaurding for increased pain, as well as tenderness of short head of bicep on coracoid process and long head of bicep tendon in bicipital groove. Mild improvement in bicep muscle tension after STM. Pt AROM limited due to pain/fear of pain this session. Overall the patient would benefit from further skilled PT to continue to progress ROM, strength, and tolerance to activity with continued pain education/pain management techniques as well.     Rehab Potential  Fair    Clinical Impairments Affecting Rehab Potential  (+) motivated, external support system, age/fitness prior to CVA (-) chronicity, severity of pain, no MRI/known etiology    PT Frequency  2x / week    PT Duration  8 weeks    PT Treatment/Interventions  ADLs/Self Care Home  Management;Aquatic Therapy;Biofeedback;Cryotherapy;Electrical Stimulation;Iontophoresis 67m/ml Dexamethasone;Moist Heat;Traction;Ultrasound;Functional mobility training;Therapeutic activities;Therapeutic exercise;Patient/family education;Neuromuscular re-education;Manual techniques;Energy conservation;Passive range of motion;Compression bandaging;Splinting;Taping    PT Next Visit Plan  review HEP, STM to musculature, PROM, pain control    PT Home Exercise Plan  see above    Consulted and Agree with Plan of Care  Patient       Patient will benefit from skilled therapeutic intervention in order to improve the following deficits and impairments:  Decreased activity tolerance, Decreased coordination, Decreased endurance, Decreased mobility, Decreased range of motion, Decreased strength, Increased fascial restricitons, Hypomobility, Increased muscle spasms, Impaired perceived functional ability, Impaired flexibility, Impaired UE functional use, Impaired sensation, Improper body mechanics, Postural dysfunction, Pain  Visit Diagnosis: Muscle weakness (generalized)  Chronic left shoulder pain  Stiffness of left shoulder, not elsewhere classified     Problem List Patient Active Problem List   Diagnosis Date Noted  . Postural dizziness 08/04/2018  . Migraine 02/23/2018  . HIV disease ([place]   . Stroke (cerebrum) ([place] - R basal ganglia d/t small vessel dz 02/18/2018  . HLD (hyperlipidemia) 08/02/2014  . Diabetes mellitus type 2, uncontrolled, with complications ([place] 016/04/9603 . Ulcerative colitis ([place] 06/26/2008  . Depression, recurrent ([place] 04/04/2008    DLieutenant DiegoPT, DPT 10:32 AM,08/30/18 [address] [facility] [address]  [place], [place], [address] Phone: [phone]   Fax:  [phone]  Name: [name] MRN: [id number] Date of Birth: 03-16-1956

## 2018-08-30 NOTE — Therapy (Signed)
[place] MAIN [facility] SERVICES [address] [place], [place], [address] Phone: [phone]   Fax:  [phone]  Occupational Therapy Treatment  Patient Details  Name: [name] MRN: [id number] Date of Birth: 1956-02-18 No data recorded  Encounter Date: 08/30/2018  OT End of Session - 08/30/18 1020    Visit Number  28    Number of Visits  43    Date for OT Re-Evaluation  09/15/18    Authorization Type  Progress report period starting 05/20/2018    OT Start Time  1015    OT Stop Time  1100    OT Time Calculation (min)  45 min    Activity Tolerance  Patient tolerated treatment well    Behavior During Therapy  [facility] for tasks assessed/performed;Anxious       Past Medical History:  Diagnosis Date  . Asthma   . Diabetes mellitus without complication ([place]) 9-32-67   hx. NIDDM-dx. 3 weeks ago  . Hyperlipidemia    hx  . Seasonal allergies   . Stroke ([place])   . Ulcerative colitis [facility])     Past Surgical History:  Procedure Laterality Date  . BUNIONECTOMY     bilateral and toe nails of big toes removed  . CESAREAN SECTION  1997/2006  . CHOLECYSTECTOMY N/A 12/01/2012   Procedure: LAPAROSCOPIC CHOLECYSTECTOMY;  Surgeon: Stark Klein, MD;  Location: [facility];  Service: General;  Laterality: N/A;  . COLONOSCOPY W/ POLYPECTOMY    . EYE SURGERY Bilateral 2013  . TONSILLECTOMY      OT TREATMENT    Neuro muscular re-education:  Pt. Worked on left hand [facility] skills grasping single wide clips using 2pt. Grasp with her thumb, and 2nd digit, and placing them onto a horizontal dowel. Pt. worked on grasping 1/2" washers  From a magnetic resistive dish using 2pt. Grasp, and placed them on a horizontal dowel stabilized with her right hand. Pt. Worked on grasping 1" sticks, and placed them on the Purdue pegboard, pt. Required the position of the pegboard to be placed horizontally. Pt. Worked on rolling a 1" foam roll in her hand distal to proximal with  her fingers. Pt. Then worked on rolling a smaller width object in her hand using the thumb to assist. Pt. worked on Engineer, production backings with a larger base. Pt. Had difficulty manipulating th ones with a smaller base. Pt. worked at the tabletop while sitting on a pillow to decrease table height distance, and her LUE supported on a pillow. Pt. does compensate proximally by leaning with the trunk during some coordination tasks.  Pt. Presents with guarding of the left shoulder during the tasks.  Therapeutic Exercise:  Pt. worked on gentle AROM formulating a fist using intrinsic stretches. Pt. required visual demonstration, and cues for proper form for each each exercise.                                 OT Long Term Goals - 07/15/18 1247      OT LONG TERM GOAL #1   Title  Pt will improve [place] by 10 secs of speed in order to independently manipulate coins during grocery shopping    Baseline  07/15/2018: [facility] skills are improving. Pt. continues to have difficulty manipulating objects, and coins.    Time  12    Period  Weeks    Status  On-going    Target Date  09/15/18      OT LONG TERM GOAL #2   Title  Pt. will perform light homemaking tasks with modified independence using energy conservation/work simplification techniques as needed.    Baseline  07/15/2018: Pt. continues to have difficulty with home management tasks.    Time  12    Period  Weeks    Status  On-going    Target Date  09/15/18      OT LONG TERM GOAL #3   Title  Pt will perform UE ADL tasks with modified independence    Baseline  07/15/2018: Pt. continues to be limited    Time  12    Period  Weeks    Status  On-going    Target Date  09/15/18      OT LONG TERM GOAL #4   Title  Pt. will perform hair/wig care with modified indpendence using compensatory strategies.     Baseline  06/23/2018: Pt. conitnues to have difficulty performing hair/wig care    Time  12    Period  Weeks    Status   On-going    Target Date  09/15/18      OT LONG TERM GOAL #5   Title  Pt. will manipulate small buttons with modified independence.    Baseline  06/24/2019: Pt conitnues to require increased time to complete buttoning.    Time  12    Period  Weeks    Status  On-going    Target Date  09/15/18            Plan - 08/30/18 1033    Occupational Profile and client history currently impacting functional performance  Pt is a wife and god-mother and was previously employed as a Radiation protection practitioner. Pt enjoys singing for leisure however has not been able to do so since experiencing CVA.    Occupational performance deficits (Please refer to evaluation for details):  ADL's;IADL's;Leisure    Rehab Potential  Good    Current Impairments/barriers affecting progress:  Positive: age, motivate, family suppory; Negative:     OT Frequency  2x / week    OT Duration  12 weeks    OT Treatment/Interventions  Self-care/ADL training;Therapeutic exercise;Neuromuscular education;Therapeutic activities;Manual Therapy;Passive range of motion    Clinical Decision Making  Several treatment options, min-mod task modification necessary    Consulted and Agree with Plan of Care  Patient       Patient will benefit from skilled therapeutic intervention in order to improve the following deficits and impairments:  Pain, Decreased cognition, Decreased range of motion, Decreased strength, Impaired perceived functional ability, Impaired UE functional use  Visit Diagnosis: Muscle weakness (generalized)  Other lack of coordination    Problem List Patient Active Problem List   Diagnosis Date Noted  . Postural dizziness 08/04/2018  . Migraine 02/23/2018  . HIV disease ([place])   . Stroke (cerebrum) ([place]) - R basal ganglia d/t small vessel dz 02/18/2018  . HLD (hyperlipidemia) 08/02/2014  . Diabetes mellitus type 2, uncontrolled, with complications ([place]) 05/39/7673  . Ulcerative colitis ([place]) 06/26/2008  .  Depression, recurrent ([place]) 04/04/2008    Harrel Carina, MS, OTR/L 08/30/2018, 10:47 AM  [place] MAIN [facility] SERVICES [address] [place], [place], [address] Phone: [phone]   Fax:  [phone]  Name: Almeda Ezra MRN: [id number] Date of Birth: [birth year]

## 2018-08-31 ENCOUNTER — Other Ambulatory Visit: Payer: Self-pay

## 2018-08-31 ENCOUNTER — Encounter: Payer: Self-pay | Admitting: Occupational Therapy

## 2018-09-02 ENCOUNTER — Ambulatory Visit: Payer: BLUE CROSS/BLUE SHIELD | Admitting: Occupational Therapy

## 2018-09-02 ENCOUNTER — Ambulatory Visit: Payer: BLUE CROSS/BLUE SHIELD

## 2018-09-02 DIAGNOSIS — G8929 Other chronic pain: Secondary | ICD-10-CM | POA: Diagnosis not present

## 2018-09-02 DIAGNOSIS — R278 Other lack of coordination: Secondary | ICD-10-CM

## 2018-09-02 DIAGNOSIS — M6281 Muscle weakness (generalized): Secondary | ICD-10-CM

## 2018-09-02 DIAGNOSIS — M25612 Stiffness of left shoulder, not elsewhere classified: Secondary | ICD-10-CM | POA: Diagnosis not present

## 2018-09-02 DIAGNOSIS — M25512 Pain in left shoulder: Secondary | ICD-10-CM | POA: Diagnosis not present

## 2018-09-02 NOTE — Therapy (Addendum)
[place] MAIN [facility] SERVICES [address] [place], [place], [address] Phone: [phone]   Fax:  [phone]  Physical Therapy Treatment  Patient Details  Name: [name] MRN: [id number] Date of Birth: August 09, 1955 Referring Provider (PT): Webb Silversmith   Encounter Date: 09/02/2018  PT End of Session - 09/02/18 1222    Visit Number  5    Number of Visits  16    Date for PT Re-Evaluation  10/14/18    Authorization Type  5/20 visits allowed PT    Authorization Time Period  5/10 eval 2/6     PT Start Time  1102    PT Stop Time  1145    PT Time Calculation (min)  43 min    Activity Tolerance  Patient limited by pain    Behavior During Therapy  [facility] for tasks assessed/performed       Past Medical History:  Diagnosis Date  . Asthma   . Diabetes mellitus without complication ([place]) 8-52-77   hx. NIDDM-dx. 3 weeks ago  . Hyperlipidemia    hx  . Seasonal allergies   . Stroke ([place])   . Ulcerative colitis [facility])     Past Surgical History:  Procedure Laterality Date  . BUNIONECTOMY     bilateral and toe nails of big toes removed  . CESAREAN SECTION  1997/2006  . CHOLECYSTECTOMY N/A 12/01/2012   Procedure: LAPAROSCOPIC CHOLECYSTECTOMY;  Surgeon: Stark Klein, MD;  Location: [facility];  Service: General;  Laterality: N/A;  . COLONOSCOPY W/ POLYPECTOMY    . EYE SURGERY Bilateral 2013  . TONSILLECTOMY      There were no vitals filed for this visit.  Subjective Assessment - 09/02/18 1104    Subjective  Patient reports her pain was 9/10 at arrival. Took a bath yesterday with epsom salts but then had a hard time to get out.     Pertinent History  Patient is a pleasant 63 year old female who presents for L shoulder pain and limited ROM s/p R basal ganglia CVA 8/08. Pt was hospitalized for 1 week where she received inpatient therapy and was then discharged home where she received home health therapy for 2 weeks. PMH includes: ulcerative  colitis, stroke, HLD, DM, asthma.  Prior to CVA, pt was independent for ADLs and IADLs. Patient reports she has been having pain in L shoulder since CVA and is unable to lift arm to perform ADL/iADLs such as dressing, doing hair/makeup. Etc. Reports pain is sore/throbbing. Per patient report X ray and nerve testing report were clear    Limitations  Sitting;Lifting;Walking;House hold activities;Writing;Other (comment)    How long can you sit comfortably?  supports L arm with R hand    How long can you stand comfortably?  n/a    How long can you walk comfortably?  limited swing of L arm    Diagnostic tests  X ray, nerve conduction test, getting MRI on 09/09/18    Currently in Pain?  Yes    Pain Score  9     Pain Location  Shoulder    Pain Orientation  Left;Right    Pain Descriptors / Indicators  Aching;Sore    Pain Type  Chronic pain    Pain Onset  More than a month ago    Pain Frequency  Intermittent       TREATMENT:   Therapeutic exercises:  AAROM with UE ranger: Placing Right hand over left to guide left hand into the following  movements for pain reduction. Verbal cueing for body mechanics, postioning of limbs for optimal movement and visual/tactile cueing for arc of movement range. PT assist for horizontal abduction, ranger repositioned multiple times to increased ROM.-unable to perform this session due to pain              Isometric: flexion, extension, abduction 20-40% muscle contraction 10x 3 second holds  AROM of supination and pronation in neutral positioning in sitting for tissue length 10-12x   Scapular retractions 10x. Tactile/verbal cueing for body mechanics.   AAROM flexion with opp UE leading LUE to ~90 degrees in seated position x 5.    AAROM ER with dowel on lap: 10 x with assistance required to start back at neutral. Second trial performed increased movement due to concurrent scapular mobilizations.    Manual therapy:  STM to bicep tendon and pectoral region x 10  minutes. Gentle gradual increase in pressure for muscle tissue lengthening due to limited patient tolerance.  Scapular mobilizations: retractions: 10x 5 second holds, 2 sets. Depression 10x 5 second holds, 3 sets. Pain relief reported by patient with repeated mobilizations   Patient educated on purse lipped breathing technique for pain reduction, intermittently thorughout session.  Frequent rest breaks required  Ultrasound: 3.3 Mz with wide head, heating on. 10 minutes to anterior and lateral aspect of GH and biceps                            PT Education - 09/02/18 1217    Education Details  bdoy mechanics, AAROM, relaxation     Person(s) Educated  Patient    Methods  Explanation;Demonstration;Tactile cues;Verbal cues    Comprehension  Verbalized understanding;Returned demonstration;Verbal cues required;Tactile cues required;Need further instruction       PT Short Term Goals - 08/19/18 1101      PT SHORT TERM GOAL #1   Title  Patient will be independent in home exercise program to improve strength/mobility for better functional independence with ADLs.    Baseline  2/6: HEP given     Time  2    Period  Weeks    Status  New    Target Date  09/02/18      PT SHORT TERM GOAL #2   Title  Patient will report a worst pain of 8/10 on VAS in L shoulder to improve tolerance with ADLs and reduced symptoms with activities.     Baseline  2/6: 10/10 pain    Time  2    Period  Weeks    Status  New    Target Date  09/02/18        PT Long Term Goals - 08/19/18 1102      PT LONG TERM GOAL #1   Title  Patient will report a worst pain of 4/10 on VAS in L shoulder to improve tolerance with ADLs and reduced symptoms with activities.     Baseline  2/6: 10/10     Time  8    Period  Weeks    Status  New    Target Date  10/14/18      PT LONG TERM GOAL #2   Title  Patient will improve shoulder AROM to > 115 degrees of flexion  and abduction for improved ability to perform  overhead activities.    Baseline  2/6: AROM: flexion 50degrees with elbow bent, 24 with elbow bent (unable to straighten arm)     Time  8    Period  Weeks    Status  New    Target Date  10/14/18      PT LONG TERM GOAL #3   Title  Patient will decrease Quick DASH score by > 8 points (to <85%) demonstrating reduced self-reported upper extremity disability.    Baseline  2/6: 93.2%    Time  8    Period  Weeks    Status  New    Target Date  10/14/18      PT LONG TERM GOAL #4   Title  Patient will demonstrate adequate shoulder ROM and strength (>3+/5) to be able to dress, perform hair care, tie shoes, and don undergarments.     Baseline  2/6: unable to use LUE for any ADLs such as dressing    Time  8    Period  Weeks    Status  New    Target Date  10/14/18            Plan - 09/02/18 1223    Clinical Impression Statement  Patient initially presented with high pain that severely limited session however as session progressed increased ability to perform AAROM was noted. Pain decreased to 5/10 by end of session. Pt demonstrates significant tightness in L pec major/minor musculature due to muscle gaurding for increased pain, as well as tenderness of short head of bicep on coracoid process and long head of bicep tendon in bicipital groove. Mild improvement in bicep muscle tension after STM.Overall the patient would benefit from further skilled PT to continue to progress ROM, strength, and tolerance to activity with continued pain education/pain management techniques as well.    Rehab Potential  Fair    Clinical Impairments Affecting Rehab Potential  (+) motivated, external support system, age/fitness prior to CVA (-) chronicity, severity of pain, no MRI/known etiology    PT Frequency  2x / week    PT Duration  8 weeks    PT Treatment/Interventions  ADLs/Self Care Home Management;Aquatic Therapy;Biofeedback;Cryotherapy;Electrical Stimulation;Iontophoresis 16m/ml Dexamethasone;Moist  Heat;Traction;Ultrasound;Functional mobility training;Therapeutic activities;Therapeutic exercise;Patient/family education;Neuromuscular re-education;Manual techniques;Energy conservation;Passive range of motion;Compression bandaging;Splinting;Taping    PT Next Visit Plan  review HEP, STM to musculature, PROM, pain control    PT Home Exercise Plan  see above    Consulted and Agree with Plan of Care  Patient       Patient will benefit from skilled therapeutic intervention in order to improve the following deficits and impairments:  Decreased activity tolerance, Decreased coordination, Decreased endurance, Decreased mobility, Decreased range of motion, Decreased strength, Increased fascial restricitons, Hypomobility, Increased muscle spasms, Impaired perceived functional ability, Impaired flexibility, Impaired UE functional use, Impaired sensation, Improper body mechanics, Postural dysfunction, Pain  Visit Diagnosis: Muscle weakness (generalized)  Other lack of coordination  Chronic left shoulder pain  Stiffness of left shoulder, not elsewhere classified     Problem List Patient Active Problem List   Diagnosis Date Noted  . Postural dizziness 08/04/2018  . Migraine 02/23/2018  . HIV disease ([place]   . Stroke (cerebrum) ([place] - R basal ganglia d/t small vessel dz 02/18/2018  . HLD (hyperlipidemia) 08/02/2014  . Diabetes mellitus type 2, uncontrolled, with complications ([place] 054/00/8676 . Ulcerative colitis ([place] 06/26/2008  . Depression, recurrent ([place] 04/04/2008   MJanna Arch PT, DPT   09/02/2018, 12:24 PM  [place] [facility] [address] [address]Phone: [phone]  Fax:  [phone] Name: MLanell CarpenterMRN: [id number]  Date of Birth: 1955/10/21

## 2018-09-02 NOTE — Therapy (Signed)
[place] MAIN [facility] SERVICES [address] [place], [place], [address] Phone: [phone]   Fax:  [phone] Occupational Therapy Progress Note  Dates of reporting period  05/30/2018   to   09/02/2018  Patient Details  Name: [name] MRN: [id number] Date of Birth: 05-09-1956 No data recorded  Encounter Date: 09/02/2018  OT End of Session - 09/02/18 1626    Visit Number  30    Number of Visits  30    Date for OT Re-Evaluation  09/15/18    Authorization Type  Progress report period starting 05/20/2018    OT Start Time  1025    OT Stop Time  1100    OT Time Calculation (min)  35 min    Activity Tolerance  Patient tolerated treatment well    Behavior During Therapy  [facility] for tasks assessed/performed       Past Medical History:  Diagnosis Date  . Asthma   . Diabetes mellitus without complication ([place]) 3-50-09   hx. NIDDM-dx. 3 weeks ago  . Hyperlipidemia    hx  . Seasonal allergies   . Stroke ([place])   . Ulcerative colitis [facility])     Past Surgical History:  Procedure Laterality Date  . BUNIONECTOMY     bilateral and toe nails of big toes removed  . CESAREAN SECTION  1997/2006  . CHOLECYSTECTOMY N/A 12/01/2012   Procedure: LAPAROSCOPIC CHOLECYSTECTOMY;  Surgeon: Stark Klein, MD;  [facility];  Service: General;  Laterality: N/A;  . COLONOSCOPY W/ POLYPECTOMY    . EYE SURGERY Bilateral 2013  . TONSILLECTOMY      There were no vitals filed for this visit.      [facility] OT Assessment - 09/02/18 0001      Coordination   Left 9 Hole Peg Test  55 sec.      Hand Function   Left Hand Grip (lbs)  8#    Left Hand Lateral Pinch  5 lbs    Left 3 point pinch  3 lbs      Measurements were obtained. Goals were reviewed with the pt.  OT TREATMENT    Pt. was provided with moist heat pack modality to her left shoulder during the session.   Neuro muscular re-education:  Pt. Worked on left hand [facility] skills grasping 1/2" flat  washes from a resistive magnetic dish. Pt. Worked on placing them onto a horizontal dowel.  Therapeutic Exercise:  Pt. Tolerated active gentle intrinsic stretches in preparation for formulating a left hand fist. Verbal cues, and step-by-step visual demonstration of the exercises were performed with the pt. Pt. worked on [employer]. pinch strength grasping yellow, and red resistive clips with her left hand removing them using lateral pinch. Visual demonstration, and repositioning of the clips were required.   Pt. Tolerate the session well.                        OT Long Term Goals - 09/02/18 1052      OT LONG TERM GOAL #1   Title  Pt will improve [place] by 10 secs of speed in order to independently manipulate coins during grocery shopping    Baseline  09/02/2018: Left hand [facility] skills are improving. Pt. continues to have difficulty manipulating small objects, and coins.    Time  12    Period  Weeks    Status  On-going    Target Date  09/15/18  OT LONG TERM GOAL #2   Title  Pt. will perform light homemaking tasks with modified independence using energy conservation/work simplification techniques as needed.    Baseline  09/02/2018: Pt. continues to work on improving home management tasks  using energy conservation, and work simplification techniques.    Time  12    Period  Weeks    Status  On-going    Target Date  09/02/18      OT LONG TERM GOAL #3   Title  Pt will perform UE ADL tasks with modified independence    Baseline  09/02/2018: Pt. is able to independently perform UE dressing.    Time  12    Period  Weeks    Status  Achieved    Target Date  09/15/18      OT LONG TERM GOAL #4   Title  Pt. will perform hair/wig care with modified indpendence using compensatory strategies.     Baseline  09/02/2018: Pt. continues to  work on hand function skills in order to be able to perform hair/wig care efficiently    Time  12    Period  Weeks    Status  On-going    Target  Date  09/15/18      OT LONG TERM GOAL #5   Title  Pt. will manipulate small buttons with modified independence.    Baseline  09/02/2018: Pt continues to have difficulty with buttoning.    Time  12    Period  Weeks    Status  On-going    Target Date  09/15/18            Plan - 09/02/18 1623    Clinical Impression Statement Pt. continues to not sleep well at night. Pt. continues to try to use her left  hand during ADL, and IADL tasks at home. Pt. continues to have Left shoulder pain which limits overall LUE functioning. Pt.. has progressed with Left hand function, and [facility] skills. Pt. continues to work on formulating a fist, improving LE hand function, motor control,and coordination skills in order to improve ADL, and IADL functioning.    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 Pt enjoys singing for leisure however has not been able to do so since experiencing CVA.    Occupational performance deficits (Please refer to evaluation for details):  ADL's;IADL's;Leisure    Rehab Potential  Good    Current Impairments/barriers affecting progress:  Positive: age, motivate, family suppory; Negative:     OT Frequency  2x / week    OT Duration  12 weeks    OT Treatment/Interventions  Self-care/ADL training;Therapeutic exercise;Neuromuscular education;Therapeutic activities;Manual Therapy;Passive range of motion    Clinical Decision Making  Several treatment options, min-mod task modification necessary    Consulted and Agree with Plan of Care  Patient       Patient will benefit from skilled therapeutic intervention in order to improve the following deficits and impairments:  Pain, Decreased cognition, Decreased range of motion, Decreased strength, Impaired perceived functional ability, Impaired UE functional use  Visit Diagnosis: Muscle weakness  (generalized)  Other lack of coordination    Problem List Patient Active Problem List   Diagnosis Date Noted  . Postural dizziness 08/04/2018  . Migraine 02/23/2018  . HIV disease ([place])   . Stroke (cerebrum) ([place]) - R basal ganglia d/t small vessel dz 02/18/2018  . HLD (  hyperlipidemia) 08/02/2014  . Diabetes mellitus type 2, uncontrolled, with complications ([place]) 07/24/347  . Ulcerative colitis ([place]) 06/26/2008  . Depression, recurrent ([place]) 04/04/2008    Harrel Carina, MS, OTR/L 09/02/2018, 4:30 PM  [place] MAIN [facility] SERVICES [address] [place], [place], [address] Phone: [phone]   Fax:  [phone]  Name: Porshe Fleagle MRN: [id number] Date of Birth: Mar 15, 1956

## 2018-09-07 ENCOUNTER — Encounter: Payer: Self-pay | Admitting: Occupational Therapy

## 2018-09-07 ENCOUNTER — Encounter (HOSPITAL_COMMUNITY): Payer: Self-pay | Admitting: *Deleted

## 2018-09-07 ENCOUNTER — Other Ambulatory Visit: Payer: Self-pay

## 2018-09-07 ENCOUNTER — Ambulatory Visit: Payer: BLUE CROSS/BLUE SHIELD | Admitting: Occupational Therapy

## 2018-09-07 ENCOUNTER — Ambulatory Visit: Payer: BLUE CROSS/BLUE SHIELD

## 2018-09-07 DIAGNOSIS — M6281 Muscle weakness (generalized): Secondary | ICD-10-CM

## 2018-09-07 DIAGNOSIS — M25512 Pain in left shoulder: Secondary | ICD-10-CM | POA: Diagnosis not present

## 2018-09-07 DIAGNOSIS — R278 Other lack of coordination: Secondary | ICD-10-CM

## 2018-09-07 DIAGNOSIS — G8929 Other chronic pain: Secondary | ICD-10-CM | POA: Diagnosis not present

## 2018-09-07 DIAGNOSIS — M25612 Stiffness of left shoulder, not elsewhere classified: Secondary | ICD-10-CM

## 2018-09-07 NOTE — Progress Notes (Signed)
Anesthesia Chart Review: [name]   Case:  [id number] Date/Time:  09/09/18 0945   Procedure:  MRI WITH ANESTHESIA LEFT SHOULDER WITH CONTRAST (Left )   Anesthesia type:  General   Pre-op diagnosis:  pain in left shoulder joint   Location:  [place] [facility] [place] OR   Surgeon:  Radiologist, Medication, MD     MRI of left shoulder ordered by Arcedo, Perico, DO (PM&R, [facility]). H&P dated 08/18/18 completed by Elroy Channel, MD with [facility] and can be viewed in [facility]. Patient was unable to tolerate MRI arthrogram with Valium, and reportedly moved to [facility] because and [place] does not provide provide MRI under anesthesia. She is scheduled for injection in radiology at 9:00 AM then will proceed with MRI under anesthesia. [facility] RN Flow Coordinator aware and discussed with radiology staff.)   DISCUSSION: Patient is a 63 year old female scheduled for the above procedure.  History includes never smoker, DM2, asthma, ulcerative colitis, HIV (diagnosed 02/2018; had not disclosed to family at that time), HLD, CVA (acute small vessel infarct right basal ganglia 02/18/18, received tPA), migraines.  - ED visit 06/25/18 for hyperglycemia (400+) following steroid injection earlier that day. Patient took glipizide. ED glucose 283-302. CO2 20, anion gap 8. A1c 06/08/18 6.6. She was discharged home.  CVA > 6 months ago. Last A1c showed good DM control. Treated for HIV (previous ID note 02/2018 indicated she had not disclosed to family, so avoid discussion of history and medication with visitors present unless otherwise clarified by patient). She will get updated labs and evaluation by anesthesia team on the day of her procedure.    PROVIDERS: Jearld Fenton, NP is PCP - Antony Contras, MD is neurologist. Last visit 07/27/18 with Venancio Poisson, NP. Patient still with left sided facial paralysis with slurred speech and occasional mental slowing at that time.  Carlyle Basques, MD is ID. Last visit 06/01/18 with Janene Madeira, NP for HIV follow-up with labs. Continue Biktarvy. Hep B and Hep A vaccination series started.  - She is not followed routinely by cardiology, but was seen by Kathlyn Sacramento, MD on 07/31/14 for chest pain hospitalization follow-up. She had a low risk stress test. PRN cardiology follow-up recommended.   LABS: She is for updated labs prior to procedure. Labs from 06/25/18 show Cr 0.93 and normal CBC. A1c 6.6 06/08/18.    IMAGES: MRI brain 02/18/18: IMPRESSION: 1. Acute small vessel infarct of the right basal ganglia, in close proximity to the right internal capsule, in keeping with reported left-sided weakness. 2. No hemorrhage or mass effect.  CTA head/neck 02/18/18: IMPRESSION: No emergent large vessel occlusion. Normal CTA of the head and neck   EKG: EKG 02/18/18: SR, low voltage, precordial leads. Non-specific ST/T wave changes. Overall, I think EKG is not significantly changed when compared to 10/08/15 tracing.   CV: Echo 02/18/18: Study Conclusions - Left ventricle: The cavity size was normal. Wall thickness was   increased in a pattern of mild LVH. Systolic function was normal.   The estimated ejection fraction was in the range of 55% to 60%.   Wall motion was normal; there were no regional wall motion   abnormalities. Doppler parameters are consistent with abnormal   left ventricular relaxation (grade 1 diastolic dysfunction). Impressions: - No cardiac source of emboli was indentified.  Nuclear stress test 06/23/14: 1.  Exercise myocardial perfusion study with no significant ischemia. 2.  There is a fixed anterior wall defect  which is likely due to breast attenuation. 3.  No significant wall motion abnormality noted.   4.  The estimated ejection fraction is 70%. 5.  The left ventricular global function was normal. 6.  There are equivocal EKG changes. 7.  There is attenuation artifact noted on the study. 8.   Overall, this is a low risk scan.   Past Medical History:  Diagnosis Date  . Asthma   . Diabetes mellitus without complication ([place]) 3-38-32   hx. NIDDM-dx. 3 weeks ago  . Hyperlipidemia    hx  . Seasonal allergies   . Stroke ([place])   . Ulcerative colitis [facility])     Past Surgical History:  Procedure Laterality Date  . BUNIONECTOMY     bilateral and toe nails of big toes removed  . CESAREAN SECTION  1997/2006  . CHOLECYSTECTOMY N/A 12/01/2012   Procedure: LAPAROSCOPIC CHOLECYSTECTOMY;  Surgeon: Stark Klein, MD;  Location[facility];  Service: General;  Laterality: N/A;  . COLONOSCOPY W/ POLYPECTOMY    . EYE SURGERY Bilateral 2013  . TONSILLECTOMY      MEDICATIONS: No current facility-administered medications for this encounter.    Marland Kitchen albuterol (PROVENTIL HFA;VENTOLIN HFA) 108 (90 Base) MCG/ACT inhaler  . aspirin EC 81 MG EC tablet  . atorvastatin (LIPITOR) 40 MG tablet  . bictegravir-emtricitabine-tenofovir AF (BIKTARVY) 50-200-25 MG TABS tablet  . diclofenac sodium (VOLTAREN) 1 % GEL  . glipiZIDE (GLUCOTROL) 10 MG tablet  . glucose 4 GM chewable tablet  . HYDROcodone-acetaminophen (NORCO/VICODIN) 5-325 MG tablet  . mirtazapine (REMERON) 7.5 MG tablet  . potassium chloride SA (K-DUR,KLOR-CON) 20 MEQ tablet  . topiramate (TOPAMAX) 50 MG tablet  . citalopram (CELEXA) 10 MG tablet  . glucose blood (TRUE METRIX BLOOD GLUCOSE TEST) test strip  . methocarbamol (ROBAXIN) 500 MG tablet  . tizanidine (ZANAFLEX) 2 MG capsule  . topiramate (TOPAMAX) 100 MG tablet    Myra Gianotti, PA-C Surgical Short Stay/Anesthesiology [facility] Phone [phone] [facility] Phone [phone] 09/07/2018 1:26 PM

## 2018-09-07 NOTE — Therapy (Signed)
[place] MAIN [facility] SERVICES [address] [place], [place], [address] Phone: [phone]   Fax:  [phone]  Occupational Therapy Treatment  Patient Details  Name: [name] MRN: [id number] Date of Birth: [birth year] No data recorded  Encounter Date: 09/07/2018  OT End of Session - 09/07/18 1635    Visit Number  31    Number of Visits  31    Date for OT Re-Evaluation  09/15/18    Authorization Type  Progress report period starting 05/20/2018    OT Start Time  1035    OT Stop Time  1100    OT Time Calculation (min)  25 min    Activity Tolerance  Patient tolerated treatment well    Behavior During Therapy  [facility] for tasks assessed/performed       Past Medical History:  Diagnosis Date  . Asthma   . Diabetes mellitus without complication ([place]) 2-97-98   hx. NIDDM-dx. 3 weeks ago  . Headache    migraines  . HIV (human immunodeficiency virus infection) ([place])   . Hyperlipidemia    hx  . Seasonal allergies   . Stroke ([place])   . Ulcerative colitis ([place])   . Wears contact lenses   . Wears partial dentures     Past Surgical History:  Procedure Laterality Date  . BUNIONECTOMY     bilateral and toe nails of big toes removed  . CESAREAN SECTION  1997/2006  . CHOLECYSTECTOMY N/A 12/01/2012   Procedure: LAPAROSCOPIC CHOLECYSTECTOMY;  Surgeon: Stark Klein, MD;  Location: [facility];  Service: General;  Laterality: N/A;  . COLONOSCOPY W/ POLYPECTOMY    . EYE SURGERY Bilateral 2013  . MULTIPLE TOOTH EXTRACTIONS    . TONSILLECTOMY    . TUBAL LIGATION      There were no vitals filed for this visit.  Subjective Assessment - 09/07/18 1634    Subjective   Pt. has an MRI this week.    Pertinent History  Pt is a 63 y.o. female presenting with LUE weakness and decreased [place] as a result of R basal ganglia CVA on 8/08. Pt was hospitalized for 1 week where she received inpatient therapy and was then discharged home where she received home health  therapy for 2 weeks. Prior to CVA, pt was independent for ADLs and IADLs. Currently, pt requires increased assistance, increased time, and/or modified strategies for ADLs and IADLs.     Patient Stated Goals  Pt wants to be able to do everything with her L hand that her R hand can do, including making wigs, picking up laundry baskets, manipulating coins for shopping, and grooming hair.    Currently in Pain?  Yes    Pain Score  8     Pain Location  Shoulder    Pain Orientation  Left      OT TREATMENT    Neuro muscular re-education:  Pt. worked on left hand [facility] skills grasping 1/2" washers with her left hand using a 2pt. pinch and placing them on a small horizontal dowel.  Therapeutic Exercise:  Pt. performed intrinsic stretches in preparation for formulating a fist with her left hand. Pt. required cues and visual demonstration.Pt. worked on pinch strengthening in the left hand for lateral, and 3pt. pinch using yellow, and red resistive clips.  Pt. worked on placing the clips at various vertical and horizontal angles. Tactile and verbal cues were required for eliciting the desired movement. Pt. required a pillow placed at her seat, and  under her left UE, as well as heat to the shoulder.                         OT Education - 09/07/18 1635    Education Details  Left hand [place] skills    Person(s) Educated  Patient    Methods  Explanation;Verbal cues;Demonstration    Comprehension  Verbalized understanding;Verbal cues required          OT Long Term Goals - 09/02/18 1052      OT LONG TERM GOAL #1   Title  Pt will improve [place] by 10 secs of speed in order to independently manipulate coins during grocery shopping    Baseline  09/02/2018: Left hand [facility] skills are improving. Pt. continues to have difficulty manipulating small objects, and coins.    Time  12    Period  Weeks    Status  On-going    Target Date  09/15/18      OT LONG TERM GOAL #2   Title  Pt. will  perform light homemaking tasks with modified independence using energy conservation/work simplification techniques as needed.    Baseline  09/02/2018: Pt. continues to work on improving home management tasks  using energy conservation, and work simplification techniques.    Time  12    Period  Weeks    Status  On-going    Target Date  09/02/18      OT LONG TERM GOAL #3   Title  Pt will perform UE ADL tasks with modified independence    Baseline  09/02/2018: Pt. is able to independently perform UE dressing.    Time  12    Period  Weeks    Status  Achieved    Target Date  09/15/18      OT LONG TERM GOAL #4   Title  Pt. will perform hair/wig care with modified indpendence using compensatory strategies.     Baseline  09/02/2018: Pt. continues to  work on hand function skills in order to be able to perform hair/wig care efficiently    Time  12    Period  Weeks    Status  On-going    Target Date  09/15/18      OT LONG TERM GOAL #5   Title  Pt. will manipulate small buttons with modified independence.    Baseline  09/02/2018: Pt continues to have difficulty with buttoning.    Time  12    Period  Weeks    Status  On-going    Target Date  09/15/18            Plan - 09/07/18 1636    Clinical Impression Statement  Pt. was very late to the session today. Pt. reported being very upset that she is not going to be seeing her orthopedic physician until 2  weeks after her MRI. Pt. reported being on the phone with  her orthopedic physicians office this morning. Pt. continues to present with left shoulder pain that continues to be limiting. Pt. is making progress with left hand [facility] skills.    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 Pt enjoys singing for leisure however has not been able to do so since experiencing CVA.    Occupational performance deficits  (Please refer to evaluation for details):  ADL's;IADL's;Leisure    Rehab Potential  Good  Current Impairments/barriers affecting progress:  Positive: age, motivate, family suppory; Negative:     OT Frequency  2x / week    OT Treatment/Interventions  Self-care/ADL training;Therapeutic exercise;Neuromuscular education;Therapeutic activities;Manual Therapy;Passive range of motion    Clinical Decision Making  Several treatment options, min-mod task modification necessary    Consulted and Agree with Plan of Care  Patient       Patient will benefit from skilled therapeutic intervention in order to improve the following deficits and impairments:  Pain, Decreased cognition, Decreased range of motion, Decreased strength, Impaired perceived functional ability, Impaired UE functional use  Visit Diagnosis: Muscle weakness (generalized)  Other lack of coordination    Problem List Patient Active Problem List   Diagnosis Date Noted  . Postural dizziness 08/04/2018  . Migraine 02/23/2018  . HIV disease ([place])   . Stroke (cerebrum) ([place]) - R basal ganglia d/t small vessel dz 02/18/2018  . HLD (hyperlipidemia) 08/02/2014  . Diabetes mellitus type 2, uncontrolled, with complications ([place]) 10/62/6948  . Ulcerative colitis ([place]) 06/26/2008  . Depression, recurrent ([place]) 04/04/2008    Harrel Carina, MS, OTR/L 09/07/2018, 4:49 PM  [place] MAIN [facility] SERVICES [address] [place], [place], [address] Phone: [phone]   Fax:  [phone]  Name: [name] MRN: [id number] Date of Birth: 1956/04/23

## 2018-09-07 NOTE — Anesthesia Preprocedure Evaluation (Addendum)
Anesthesia Evaluation  Patient identified by MRN, date of birth, ID band Patient awake    Reviewed: Allergy & Precautions, NPO status , Patient's Chart, lab work & pertinent test results  Airway Mallampati: III  TM Distance: >3 FB Neck ROM: Full    Dental no notable dental hx. (+) Partial Lower, Dental Advisory Given   Pulmonary asthma ,    Pulmonary exam normal breath sounds clear to auscultation       Cardiovascular negative cardio ROS Normal cardiovascular exam Rhythm:Regular Rate:Normal  Stress Test 2015 unremarkable  TTE 2019 Normal EF, valves normal   Neuro/Psych PSYCHIATRIC DISORDERS Depression CVA (left arm weakness), Residual Symptoms    GI/Hepatic negative GI ROS, Neg liver ROS, H/o UC   Endo/Other  negative endocrine ROSdiabetes, Type 2  Renal/GU negative Renal ROS  negative genitourinary   Musculoskeletal negative musculoskeletal ROS (+)   Abdominal   Peds  Hematology  (+) HIV,   Anesthesia Other Findings Left shoulder MRI  Reproductive/Obstetrics negative OB ROS                           Anesthesia Physical Anesthesia Plan  ASA: III  Anesthesia Plan: General   Post-op Pain Management:    Induction: Intravenous  PONV Risk Score and Plan: 3 and Ondansetron, Dexamethasone and Midazolam  Airway Management Planned: Oral ETT  Additional Equipment:   Intra-op Plan:   Post-operative Plan: Extubation in OR  Informed Consent: I have reviewed the patients History and Physical, chart, labs and discussed the procedure including the risks, benefits and alternatives for the proposed anesthesia with the patient or authorized representative who has indicated his/her understanding and acceptance.     Dental advisory given  Plan Discussed with: CRNA  Anesthesia Plan Comments: ( )       Anesthesia Quick Evaluation

## 2018-09-07 NOTE — Therapy (Signed)
[place] MAIN [facility] SERVICES [address] [place], [place], [address] Phone: [phone]   Fax:  [phone]  Physical Therapy Treatment  Patient Details  Name: [name] MRN: [id number] Date of Birth: 08/14/1955 Referring Provider (PT): Webb Silversmith   Encounter Date: 09/07/2018  PT End of Session - 09/07/18 1152    Visit Number  6    Number of Visits  16    Date for PT Re-Evaluation  10/14/18    Authorization Type  6/20 visits allowed PT    Authorization Time Period  6/10 eval 2/6     PT Start Time  1103    PT Stop Time  1143    PT Time Calculation (min)  40 min    Activity Tolerance  Patient limited by pain    Behavior During Therapy  [facility] for tasks assessed/performed       Past Medical History:  Diagnosis Date  . Asthma   . Diabetes mellitus without complication ([place]) 11-29-82   hx. NIDDM-dx. 3 weeks ago  . Hyperlipidemia    hx  . Seasonal allergies   . Stroke ([place])   . Ulcerative colitis [facility])     Past Surgical History:  Procedure Laterality Date  . BUNIONECTOMY     bilateral and toe nails of big toes removed  . CESAREAN SECTION  1997/2006  . CHOLECYSTECTOMY N/A 12/01/2012   Procedure: LAPAROSCOPIC CHOLECYSTECTOMY;  Surgeon: Stark Klein, MD;  Location[facility];  Service: General;  Laterality: N/A;  . COLONOSCOPY W/ POLYPECTOMY    . EYE SURGERY Bilateral 2013  . TONSILLECTOMY      There were no vitals filed for this visit.  Subjective Assessment - 09/07/18 1103    Subjective  Patient reports pain is a 7-8/10 with pain medicine. Did not sleep last night. Reports it was very aggrevated over the weekend since she tried to do dishes, folding clothes, etc.     Pertinent History  Patient is a pleasant 63 year old female who presents for L shoulder pain and limited ROM s/p R basal ganglia CVA 8/08. Pt was hospitalized for 1 week where she received inpatient therapy and was then discharged home where she received home  health therapy for 2 weeks. PMH includes: ulcerative colitis, stroke, HLD, DM, asthma.  Prior to CVA, pt was independent for ADLs and IADLs. Patient reports she has been having pain in L shoulder since CVA and is unable to lift arm to perform ADL/iADLs such as dressing, doing hair/makeup. Etc. Reports pain is sore/throbbing. Per patient report X ray and nerve testing report were clear    Limitations  Sitting;Lifting;Walking;House hold activities;Writing;Other (comment)    How long can you sit comfortably?  supports L arm with R hand    How long can you stand comfortably?  n/a    How long can you walk comfortably?  limited swing of L arm    Diagnostic tests  X ray, nerve conduction test, getting MRI on 09/09/18    Currently in Pain?  Yes    Pain Score  8     Pain Location  Shoulder    Pain Orientation  Left    Pain Descriptors / Indicators  Aching;Sore    Pain Type  Chronic pain    Pain Onset  More than a month ago    Pain Frequency  Intermittent    Aggravating Factors   LUE movement, reaching, dressing       TREATMENT:   Therapeutic  exercises:  AAROM with UE ranger: Placing Right hand over left to guide left hand into the following movements for pain reduction. Verbal cueing for body mechanics, postioning of limbs for optimal movement and visual/tactile cueing for arc of movement range. PT assist for horizontal abduction, ranger repositioned multiple times to increased ROM., performed 2 trials, one pre-tape one post tape. Improved ROM post tape.     Isometric: flexion, extension, abduction 20-40% muscle contraction 10x 3 second holds   Scapular retractions 10x. Tactile/verbal cueing for body mechanics.    AAROM flexion with dowel with opp UE leading LUE to ~90 degrees in seated position x 5.     AAROM ER with dowel on lap: 10 x with assistance required to start back at neutral. Second trial performed increased movement due to concurrent scapular mobilizations.   Seated AROM: grab cone  and bring laterally for abduction and ER to table, 7x, performed opposite direction for cross body adduction and IR 7x. Slight increase in pain 2 minutes.   Manual therapy:  STM to bicep tendon and pectoral region x 7 minutes. Gentle gradual increase in pressure for muscle tissue lengthening due to limited patient tolerance.   Scapular mobilizations: retractions: 10x 5 second holds, 2 sets. Depression 10x 5 second holds, 3 sets. Pain relief reported by patient with repeated mobilizations    K tape performed with three pieces: Two AP for right scapular depression, one A to P laterally for ER. Patient reports immediate pain relief and performs AROM of ~15 degrees.  Patient educated on purse lipped breathing technique for pain reduction, intermittently throughout session.   Frequent rest breaks required                              PT Education - 09/07/18 1151    Education Details  K tape, posture brace, AAROM, AROM     Person(s) Educated  Patient    Methods  Explanation;Demonstration;Tactile cues;Handout    Comprehension  Verbalized understanding;Returned demonstration;Verbal cues required;Tactile cues required;Need further instruction       PT Short Term Goals - 08/19/18 1101      PT SHORT TERM GOAL #1   Title  Patient will be independent in home exercise program to improve strength/mobility for better functional independence with ADLs.    Baseline  2/6: HEP given     Time  2    Period  Weeks    Status  New    Target Date  09/02/18      PT SHORT TERM GOAL #2   Title  Patient will report a worst pain of 8/10 on VAS in L shoulder to improve tolerance with ADLs and reduced symptoms with activities.     Baseline  2/6: 10/10 pain    Time  2    Period  Weeks    Status  New    Target Date  09/02/18        PT Long Term Goals - 08/19/18 1102      PT LONG TERM GOAL #1   Title  Patient will report a worst pain of 4/10 on VAS in L shoulder to improve  tolerance with ADLs and reduced symptoms with activities.     Baseline  2/6: 10/10     Time  8    Period  Weeks    Status  New    Target Date  10/14/18      PT LONG TERM GOAL #  2   Title  Patient will improve shoulder AROM to > 115 degrees of flexion  and abduction for improved ability to perform overhead activities.    Baseline  2/6: AROM: flexion 50degrees with elbow bent, 24 with elbow bent (unable to straighten arm)     Time  8    Period  Weeks    Status  New    Target Date  10/14/18      PT LONG TERM GOAL #3   Title  Patient will decrease Quick DASH score by > 8 points (to <85%) demonstrating reduced self-reported upper extremity disability.    Baseline  2/6: 93.2%    Time  8    Period  Weeks    Status  New    Target Date  10/14/18      PT LONG TERM GOAL #4   Title  Patient will demonstrate adequate shoulder ROM and strength (>3+/5) to be able to dress, perform hair care, tie shoes, and don undergarments.     Baseline  2/6: unable to use LUE for any ADLs such as dressing    Time  8    Period  Weeks    Status  New    Target Date  10/14/18            Plan - 09/07/18 1153    Clinical Impression Statement  Patient demonstrates improved posture, pain tolerance, and ROM with use of K tape for postural correction and R scapular depression and retraction.  Pain decreased to 4/10 by end of session. Patient watched video on pain and pain cycle due to patient tensing prior to motion resulting in pain prior to movement fulfillment. Overall the patient would benefit from further skilled PT to continue to progress ROM, strength, and tolerance to activity with continued pain education/pain management techniques as well.    Rehab Potential  Fair    Clinical Impairments Affecting Rehab Potential  (+) motivated, external support system, age/fitness prior to CVA (-) chronicity, severity of pain, no MRI/known etiology    PT Frequency  2x / week    PT Duration  8 weeks    PT  Treatment/Interventions  ADLs/Self Care Home Management;Aquatic Therapy;Biofeedback;Cryotherapy;Electrical Stimulation;Iontophoresis 35m/ml Dexamethasone;Moist Heat;Traction;Ultrasound;Functional mobility training;Therapeutic activities;Therapeutic exercise;Patient/family education;Neuromuscular re-education;Manual techniques;Energy conservation;Passive range of motion;Compression bandaging;Splinting;Taping    PT Next Visit Plan  review HEP, STM to musculature, PROM, pain control    PT Home Exercise Plan  see above    Consulted and Agree with Plan of Care  Patient       Patient will benefit from skilled therapeutic intervention in order to improve the following deficits and impairments:  Decreased activity tolerance, Decreased coordination, Decreased endurance, Decreased mobility, Decreased range of motion, Decreased strength, Increased fascial restricitons, Hypomobility, Increased muscle spasms, Impaired perceived functional ability, Impaired flexibility, Impaired UE functional use, Impaired sensation, Improper body mechanics, Postural dysfunction, Pain  Visit Diagnosis: Muscle weakness (generalized)  Other lack of coordination  Chronic left shoulder pain  Stiffness of left shoulder, not elsewhere classified     Problem List Patient Active Problem List   Diagnosis Date Noted  . Postural dizziness 08/04/2018  . Migraine 02/23/2018  . HIV disease ([place]   . [place]) [place] - R basal ganglia d/t small vessel dz 02/18/2018  . HLD (hyperlipidemia) 08/02/2014  . Diabetes mellitus type 2, uncontrolled, with complications ([place] 041/28/7867 . Ulcerative colitis ([place] 06/26/2008  . Depression, recurrent ([place] 04/04/2008   MJanna Arch PT, DPT   09/07/2018,  11:54 AM  [place] MAIN [facility] SERVICES [address] [place], [place], [address] Phone: [phone]   Fax:  [phone]  Name: [name] MRN: [id number] Date of Birth:  [birth year]

## 2018-09-07 NOTE — Progress Notes (Signed)
Per pt request, do not discuss pt health information in front of anyone other than pt on day of procedure. Pt stated that she needs to have anesthesia pior to having the contrast injected.  Pt denies SOB, chest pain, and being under the care of a cardiologist.Pt denies having a cardiac cath. Pt denies recent labs. Pt made aware to hold Glipizide the night prior to MRI and day of MRI. Pt made aware to stop taking vitamins and herbal medications. Pt made aware to check BG every 2 hours prior to arrival to hospital on DOS. Pt made aware to treat a BG < 70 with 4 ounces of apple or cranberry juice, wait 15 minutes after intervention to recheck BG, if BG remains < 70, call Short Stay unit to speak with a nurse. Pt verbalized understanding of all pre-op instructions.

## 2018-09-09 ENCOUNTER — Ambulatory Visit (HOSPITAL_COMMUNITY): Payer: BLUE CROSS/BLUE SHIELD | Admitting: Vascular Surgery

## 2018-09-09 ENCOUNTER — Ambulatory Visit: Payer: Self-pay

## 2018-09-09 ENCOUNTER — Ambulatory Visit (HOSPITAL_COMMUNITY)
Admission: RE | Admit: 2018-09-09 | Discharge: 2018-09-09 | Disposition: A | Discharge status: Home / Self Care | Payer: BLUE CROSS/BLUE SHIELD | Attending: Rehabilitation | Admitting: Rehabilitation

## 2018-09-09 ENCOUNTER — Encounter (HOSPITAL_COMMUNITY): Payer: Self-pay

## 2018-09-09 ENCOUNTER — Other Ambulatory Visit: Payer: Self-pay

## 2018-09-09 ENCOUNTER — Encounter (HOSPITAL_COMMUNITY)
Admission: RE | Disposition: A | Discharge status: Home / Self Care | Payer: Self-pay | Source: Home / Self Care | Attending: Diagnostic Radiology

## 2018-09-09 ENCOUNTER — Ambulatory Visit (HOSPITAL_COMMUNITY)
Admission: RE | Admit: 2018-09-09 | Discharge: 2018-09-09 | Disposition: A | Discharge status: Home / Self Care | Payer: BLUE CROSS/BLUE SHIELD | Source: Ambulatory Visit | Attending: Rehabilitation | Admitting: Rehabilitation

## 2018-09-09 ENCOUNTER — Encounter: Payer: Self-pay | Admitting: Occupational Therapy

## 2018-09-09 DIAGNOSIS — Z8673 Personal history of transient ischemic attack (TIA), and cerebral infarction without residual deficits: Secondary | ICD-10-CM | POA: Diagnosis not present

## 2018-09-09 DIAGNOSIS — Z7982 Long term (current) use of aspirin: Secondary | ICD-10-CM | POA: Diagnosis not present

## 2018-09-09 DIAGNOSIS — M19012 Primary osteoarthritis, left shoulder: Secondary | ICD-10-CM

## 2018-09-09 DIAGNOSIS — M25512 Pain in left shoulder: Secondary | ICD-10-CM

## 2018-09-09 DIAGNOSIS — J45909 Unspecified asthma, uncomplicated: Secondary | ICD-10-CM | POA: Insufficient documentation

## 2018-09-09 DIAGNOSIS — E119 Type 2 diabetes mellitus without complications: Secondary | ICD-10-CM | POA: Insufficient documentation

## 2018-09-09 DIAGNOSIS — Z79899 Other long term (current) drug therapy: Secondary | ICD-10-CM | POA: Diagnosis not present

## 2018-09-09 DIAGNOSIS — E785 Hyperlipidemia, unspecified: Secondary | ICD-10-CM | POA: Insufficient documentation

## 2018-09-09 DIAGNOSIS — Z7984 Long term (current) use of oral hypoglycemic drugs: Secondary | ICD-10-CM | POA: Insufficient documentation

## 2018-09-09 DIAGNOSIS — Z791 Long term (current) use of non-steroidal anti-inflammatories (NSAID): Secondary | ICD-10-CM | POA: Diagnosis not present

## 2018-09-09 HISTORY — DX: Headache: R51

## 2018-09-09 HISTORY — DX: Presence of spectacles and contact lenses: Z97.3

## 2018-09-09 HISTORY — DX: Presence of dental prosthetic device (complete) (partial): Z97.2

## 2018-09-09 HISTORY — DX: Asymptomatic human immunodeficiency virus (hiv) infection status: Z21

## 2018-09-09 HISTORY — PX: RADIOLOGY WITH ANESTHESIA: SHX6223

## 2018-09-09 HISTORY — DX: Human immunodeficiency virus (HIV) disease: B20

## 2018-09-09 HISTORY — DX: Headache, unspecified: R51.9

## 2018-09-09 LAB — CBC
HCT: 36.3 % (ref 36.0–46.0)
Hemoglobin: 11.5 g/dL — ABNORMAL LOW (ref 12.0–15.0)
MCH: 30.3 pg (ref 26.0–34.0)
MCHC: 31.7 g/dL (ref 30.0–36.0)
MCV: 95.5 fL (ref 80.0–100.0)
Platelets: 210 10*3/uL (ref 150–400)
RBC: 3.8 MIL/uL — ABNORMAL LOW (ref 3.87–5.11)
RDW: 13.2 % (ref 11.5–15.5)
WBC: 5.7 10*3/uL (ref 4.0–10.5)
nRBC: 0 % (ref 0.0–0.2)

## 2018-09-09 LAB — BASIC METABOLIC PANEL
Anion gap: 7 (ref 5–15)
BUN: 12 mg/dL (ref 8–23)
CO2: 23 mmol/L (ref 22–32)
Calcium: 9.2 mg/dL (ref 8.9–10.3)
Chloride: 111 mmol/L (ref 98–111)
Creatinine, Ser: 0.97 mg/dL (ref 0.44–1.00)
GFR calc Af Amer: >60 mL/min (ref >60)
GFR calc non Af Amer: >60 mL/min (ref >60)
Glucose, Bld: 146 mg/dL — ABNORMAL HIGH (ref 70–99)
POTASSIUM: 3.7 mmol/L (ref 3.5–5.1)
Sodium: 141 mmol/L (ref 135–145)

## 2018-09-09 LAB — GLUCOSE, CAPILLARY
Glucose-Capillary: 129 mg/dL — ABNORMAL HIGH (ref 70–99)
Glucose-Capillary: 120 mg/dL — ABNORMAL HIGH (ref 70–99)
Glucose-Capillary: 111 mg/dL — ABNORMAL HIGH (ref 70–99)
Glucose-Capillary: 94 mg/dL (ref 70–99)
Glucose-Capillary: 168 mg/dL — ABNORMAL HIGH (ref 70–99)

## 2018-09-09 SURGERY — MRI WITH ANESTHESIA
Anesthesia: General | Laterality: Left

## 2018-09-09 MED ORDER — LACTATED RINGERS IV SOLN
INTRAVENOUS | Status: DC
  Administered 2018-09-09: 07:00:00 via INTRAVENOUS

## 2018-09-09 MED ORDER — LORAZEPAM 0.5 MG PO TABS
ORAL_TABLET | ORAL | Status: AC
  Filled 2018-09-09: qty 1

## 2018-09-09 MED ORDER — IOPAMIDOL (ISOVUE-M 200) INJECTION 41%
10.0000 mL | Freq: Once | INTRAMUSCULAR | Status: AC
  Administered 2018-09-09: 10 mL via INTRA_ARTICULAR

## 2018-09-09 MED ORDER — GADOBENATE DIMEGLUMINE 529 MG/ML IV SOLN
5.0000 mL | Freq: Once | INTRAVENOUS | Status: AC | PRN
  Administered 2018-09-09: 0.1 mL via INTRAVENOUS

## 2018-09-09 MED ORDER — LORAZEPAM 1 MG PO TABS
1.0000 mg | ORAL_TABLET | Freq: Once | ORAL | Status: AC
  Administered 2018-09-09: 1 mg via ORAL

## 2018-09-09 MED ORDER — LIDOCAINE HCL (PF) 1 % IJ SOLN
5.0000 mL | Freq: Once | INTRAMUSCULAR | Status: AC
  Administered 2018-09-09: 5 mL via INTRADERMAL

## 2018-09-09 NOTE — Transfer of Care (Signed)
Immediate Anesthesia Transfer of Care Note  Patient: [name]  Procedure(s) Performed: MRI WITH ANESTHESIA LEFT SHOULDER WITH CONTRAST (Left )  Patient Location: PACU  Anesthesia Type:General  Level of Consciousness: awake, oriented and patient cooperative  Airway & Oxygen Therapy: Patient Spontanous Breathing and Patient connected to nasal cannula oxygen  Post-op Assessment: Report given to RN and Post -op Vital signs reviewed and stable  Post vital signs: Reviewed  Last Vitals:  Vitals Value Taken Time  BP 130/61 09/09/2018 12:58 PM  Temp    Pulse 79 09/09/2018  1:05 PM  Resp 17 09/09/2018  1:05 PM  SpO2 100 % 09/09/2018  1:05 PM  Vitals shown include unvalidated device data.  Last Pain:  Vitals:   09/09/18 0717  TempSrc:   PainSc: 6       Patients Stated Pain Goal: 3 (47/99/87 2158)  Complications: No apparent anesthesia complications

## 2018-09-10 ENCOUNTER — Encounter (HOSPITAL_COMMUNITY): Payer: Self-pay | Admitting: Radiology

## 2018-09-10 MED FILL — Lidocaine HCl(Cardiac) IV PF Soln Pref Syr 100 MG/5ML (2%): INTRAVENOUS | Qty: 5 | Status: AC

## 2018-09-10 MED FILL — Sugammadex Sodium IV 200 MG/2ML (Base Equivalent): INTRAVENOUS | Qty: 2 | Status: AC

## 2018-09-10 MED FILL — Fentanyl Citrate Preservative Free (PF) Inj 100 MCG/2ML: INTRAMUSCULAR | Qty: 2 | Status: AC

## 2018-09-10 MED FILL — Dexamethasone Sodium Phosphate Inj 10 MG/ML: INTRAMUSCULAR | Qty: 0.5 | Status: AC

## 2018-09-10 MED FILL — Propofol IV Emul 200 MG/20ML (10 MG/ML): INTRAVENOUS | Qty: 10 | Status: AC

## 2018-09-10 MED FILL — Rocuronium Bromide IV Soln 50 MG/5ML (10 MG/ML): INTRAVENOUS | Qty: 5 | Status: AC

## 2018-09-10 MED FILL — Phenylephrine-NaCl Pref Syr 0.4 MG/10ML-0.9% (40 MCG/ML): INTRAVENOUS | Qty: 10 | Status: AC

## 2018-09-10 NOTE — Anesthesia Postprocedure Evaluation (Signed)
Anesthesia Post Note  Patient: Tamyia Minich  Procedure(s) Performed: MRI WITH ANESTHESIA LEFT SHOULDER WITH CONTRAST (Left )     Patient location during evaluation: PACU Anesthesia Type: General Level of consciousness: awake and alert Pain management: pain level controlled Vital Signs Assessment: post-procedure vital signs reviewed and stable Respiratory status: spontaneous breathing, nonlabored ventilation, respiratory function stable and patient connected to nasal cannula oxygen Cardiovascular status: blood pressure returned to baseline and stable Postop Assessment: no apparent nausea or vomiting Anesthetic complications: no    Last Vitals:  Vitals:   09/09/18 1342 09/09/18 1353  BP: 135/72 128/70  Pulse: 81 88  Resp: 17 18  Temp: 36.7 C 36.7 C  SpO2: 100% 100%    Last Pain:  Vitals:   09/09/18 1353  TempSrc:   PainSc: 0-No pain                 Fatimata Talsma L Kenley Troop

## 2018-09-13 ENCOUNTER — Ambulatory Visit: Payer: BLUE CROSS/BLUE SHIELD | Attending: Internal Medicine

## 2018-09-13 ENCOUNTER — Other Ambulatory Visit (INDEPENDENT_AMBULATORY_CARE_PROVIDER_SITE_OTHER): Payer: BLUE CROSS/BLUE SHIELD

## 2018-09-13 ENCOUNTER — Ambulatory Visit: Payer: BLUE CROSS/BLUE SHIELD | Admitting: Occupational Therapy

## 2018-09-13 DIAGNOSIS — M25612 Stiffness of left shoulder, not elsewhere classified: Secondary | ICD-10-CM | POA: Insufficient documentation

## 2018-09-13 DIAGNOSIS — E1165 Type 2 diabetes mellitus with hyperglycemia: Secondary | ICD-10-CM

## 2018-09-13 DIAGNOSIS — R278 Other lack of coordination: Secondary | ICD-10-CM | POA: Insufficient documentation

## 2018-09-13 DIAGNOSIS — M6281 Muscle weakness (generalized): Secondary | ICD-10-CM | POA: Insufficient documentation

## 2018-09-13 DIAGNOSIS — M25512 Pain in left shoulder: Secondary | ICD-10-CM | POA: Insufficient documentation

## 2018-09-13 DIAGNOSIS — G8929 Other chronic pain: Secondary | ICD-10-CM | POA: Insufficient documentation

## 2018-09-13 LAB — POCT GLYCOSYLATED HEMOGLOBIN (HGB A1C): Hemoglobin A1C: 6.5 % — AB (ref 4.0–5.6)

## 2018-09-15 ENCOUNTER — Ambulatory Visit: Payer: BLUE CROSS/BLUE SHIELD | Admitting: Occupational Therapy

## 2018-09-15 ENCOUNTER — Encounter: Payer: Self-pay | Admitting: Occupational Therapy

## 2018-09-15 ENCOUNTER — Ambulatory Visit: Payer: BLUE CROSS/BLUE SHIELD

## 2018-09-15 ENCOUNTER — Other Ambulatory Visit: Payer: Self-pay

## 2018-09-15 DIAGNOSIS — M25512 Pain in left shoulder: Secondary | ICD-10-CM | POA: Diagnosis not present

## 2018-09-15 DIAGNOSIS — M6281 Muscle weakness (generalized): Secondary | ICD-10-CM

## 2018-09-15 DIAGNOSIS — R278 Other lack of coordination: Secondary | ICD-10-CM | POA: Diagnosis not present

## 2018-09-15 DIAGNOSIS — M25612 Stiffness of left shoulder, not elsewhere classified: Secondary | ICD-10-CM

## 2018-09-15 DIAGNOSIS — G8929 Other chronic pain: Secondary | ICD-10-CM

## 2018-09-15 NOTE — Therapy (Signed)
[place] MAIN [facility] SERVICES [address] [place], [place], [address] Phone: [phone]   Fax:  [phone]  Occupational Therapy Treatment/Recertification  Patient Details  Name: [name] MRN: [id number] Date of Birth: 19-Jun-1956 No data recorded  Encounter Date: 09/15/2018  OT End of Session - 09/15/18 1004    Visit Number  32    Number of Visits  65    Date for OT Re-Evaluation  12/08/18    Authorization Type  Progress report period starting 09/07/2018    OT Start Time  0935    OT Stop Time  1015    OT Time Calculation (min)  40 min    Activity Tolerance  Patient tolerated treatment well    Behavior During Therapy  [facility] for tasks assessed/performed       Past Medical History:  Diagnosis Date  . Asthma   . Diabetes mellitus without complication ([place]) 7-32-20   hx. NIDDM-dx. 3 weeks ago  . Headache    migraines  . HIV (human immunodeficiency virus infection) ([place])   . Hyperlipidemia    hx  . Seasonal allergies   . Stroke ([place])   . Ulcerative colitis ([place])   . Wears contact lenses   . Wears partial dentures     Past Surgical History:  Procedure Laterality Date  . BUNIONECTOMY     bilateral and toe nails of big toes removed  . CESAREAN SECTION  1997/2006  . CHOLECYSTECTOMY N/A 12/01/2012   Procedure: LAPAROSCOPIC CHOLECYSTECTOMY;  Surgeon: Stark Klein, MD;  [facility]: [facility];  Service: General;  Laterality: N/A;  . COLONOSCOPY W/ POLYPECTOMY    . EYE SURGERY Bilateral 2013  . MULTIPLE TOOTH EXTRACTIONS    . RADIOLOGY WITH ANESTHESIA Left 09/09/2018   Procedure: MRI WITH ANESTHESIA LEFT SHOULDER WITH CONTRAST;  Surgeon: Radiologist, Medication, MD;  Location: [place];  Service: Radiology;  Laterality: Left;  . TONSILLECTOMY    . TUBAL LIGATION      There were no vitals filed for this visit.  Subjective Assessment - 09/15/18 1003    Subjective   Pt. had an MRI last week.    Pertinent History  Pt is a 63 y.o. female  presenting with LUE weakness and decreased [place] as a result of R basal ganglia CVA on 8/08. Pt was hospitalized for 1 week where she received inpatient therapy and was then discharged home where she received home health therapy for 2 weeks. Prior to CVA, pt was independent for ADLs and IADLs. Currently, pt requires increased assistance, increased time, and/or modified strategies for ADLs and IADLs.     Patient Stated Goals  Pt wants to be able to do everything with her L hand that her R hand can do, including making wigs, picking up laundry baskets, manipulating coins for shopping, and grooming hair.    Currently in Pain?  Yes    Pain Score  3     Pain Location  Shoulder    Pain Orientation  Left         [facility] OT Assessment - 09/15/18 1006      Coordination   Left 9 Hole Peg Test  48      Hand Function   Left Hand Grip (lbs)  8#    Left Hand Lateral Pinch  5 lbs    Left 3 point pinch  3 lbs       OT TREATMENT    Measurements were obtained, and goals were reviewed with the  pt.  Neuro muscular re-education:  Pt. performed [facility] tasks using the Grooved pegboard. Pt. worked on grasping the grooved pegs from a horizontal position, and moving the pegs to a vertical position in the hand to prepare for placing them in the grooved slot. Pt. required repositioning of the grooved pegboard when manipulating the grooved pegs. Pt. Used her right hand to grasp, and turn the pegs in her hand. Pt.has long nails on all 10 digits.   Therapeutic Ex.  Pt. Worked on pinch strengthening in the left hand for lateral, and 3pt. pinch using yellow, red, and green resistive clips. Pt. worked on placing the clips horizontal angles. Tactile and verbal cues were required for eliciting the desired movement. Pt. was able to successfully complete the green clips with lateral pinch, however was unable to complete with 3pt. pinch.  Pt. Performed hand strengthening with green theraputty. Pt. required cues for proper  technique. Pt. worked on gross grip loop, lateral pinch, 3pt. pinch, gross digit extension, thumb abduction, and thumb opposition. Cues for Visual demonstration were provided. HEP reviewed.                        OT Education - 09/15/18 1004    Education Details  Left hand [place] skills    Person(s) Educated  Patient    Methods  Explanation;Verbal cues;Demonstration    Comprehension  Verbalized understanding;Verbal cues required          OT Long Term Goals - 09/15/18 1005      OT LONG TERM GOAL #1   Title  Pt will improve [place] by 10 secs of speed in order to independently manipulate coins during grocery shopping    Baseline  09/15/2018: Left hand [facility] skills are improving consistently improving. Pt. continues to have difficulty manipulating small objects, and coins.    Time  12    Period  Weeks    Status  On-going    Target Date  12/08/18      OT LONG TERM GOAL #2   Title  Pt. will perform light homemaking tasks with modified independence using energy conservation/work simplification techniques as needed.    Baseline  09/15/2018: Pt. continues to work on improving home management tasks  using energy conservation, and work simplification techniques.    Time  12    Period  Weeks    Status  On-going    Target Date  09/15/18      OT LONG TERM GOAL #4   Title  Pt. will perform hair/wig care with modified indpendence using compensatory strategies.     Baseline  09/15/2018: Pt. continues to  work on hand function skills in order to be able to perform hair/wig care efficiently    Time  12    Period  Weeks    Status  On-going    Target Date  12/08/18      OT LONG TERM GOAL #5   Title  Pt. will manipulate small buttons with modified independence.    Baseline  09/15/2018: Pt continues to have difficulty with buttoning.    Time  12    Period  Weeks    Status  On-going    Target Date  12/08/18            Plan - 09/15/18 1210    Clinical Impression Statement  Pt.  reports pain has improved since having her MRI last Thursday. Left shoulder limitations continue to impact overall progress. Pt. is  making steady progress overall with left grip strength, and  pinch strength. Pt. has made excellent progress with left hand [facility] skills. Pt. continues to work on improving Left hand grip strength, pincg strength, and [place] skills. in order to be able to use her hand to manipulate buttons, and engage in ADL, and IADL tasks. Pt. conitnues to beenfit from work simplification, and energy conservation strategies during ADLs, and IADL tasks.    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 Pt enjoys singing for leisure however has not been able to do so since experiencing CVA.    Occupational performance deficits (Please refer to evaluation for details):  ADL's;IADL's;Leisure    OT Frequency  2x / week    OT Duration  12 weeks    OT Treatment/Interventions  Self-care/ADL training;Therapeutic exercise;Neuromuscular education;Therapeutic activities;Manual Therapy;Passive range of motion    Consulted and Agree with Plan of Care  Patient       Patient will benefit from skilled therapeutic intervention in order to improve the following deficits and impairments:     Visit Diagnosis: Muscle weakness (generalized)  Other lack of coordination    Problem List Patient Active Problem List   Diagnosis Date Noted  . Postural dizziness 08/04/2018  . Migraine 02/23/2018  . HIV disease ([place])   . Stroke (cerebrum) ([place]) - R basal ganglia d/t small vessel dz 02/18/2018  . HLD (hyperlipidemia) 08/02/2014  . Diabetes mellitus type 2, uncontrolled, with complications ([place]) 22/97/9892  . Ulcerative colitis ([place]) 06/26/2008  . Depression, recurrent ([place]) 04/04/2008    Harrel Carina, MS, OTR/L 09/15/2018, 12:17 PM  [place] MAIN [facility] SERVICES [address] [place], [place], [address] Phone: [phone]   Fax:  [phone]  Name: [name] MRN: [id number] Date of Birth: 1956-03-23

## 2018-09-15 NOTE — Therapy (Signed)
[place] MAIN [facility] SERVICES [address] [place], [place], [address] Phone: [phone]   Fax:  [phone]  Physical Therapy Treatment  Patient Details  Name: [name] MRN: [id number] Date of Birth: 1955-10-05 Referring Provider (PT): Webb Silversmith   Encounter Date: 09/15/2018  PT End of Session - 09/15/18 1024    Visit Number  7    Number of Visits  16    Date for PT Re-Evaluation  10/14/18    Authorization Type  7/20 visits allowed PT    Authorization Time Period  7/10 eval 2/6     PT Start Time  0855    PT Stop Time  0930    PT Time Calculation (min)  35 min    Activity Tolerance  Patient limited by pain    Behavior During Therapy  [facility] for tasks assessed/performed       Past Medical History:  Diagnosis Date  . Asthma   . Diabetes mellitus without complication ([place]) 1-60-73   hx. NIDDM-dx. 3 weeks ago  . Headache    migraines  . HIV (human immunodeficiency virus infection) ([place])   . Hyperlipidemia    hx  . Seasonal allergies   . Stroke ([place])   . Ulcerative colitis ([place])   . Wears contact lenses   . Wears partial dentures     Past Surgical History:  Procedure Laterality Date  . BUNIONECTOMY     bilateral and toe nails of big toes removed  . CESAREAN SECTION  1997/2006  . CHOLECYSTECTOMY N/A 12/01/2012   Procedure: LAPAROSCOPIC CHOLECYSTECTOMY;  Surgeon: Stark Klein, MD;  [facility];  Service: General;  Laterality: N/A;  . COLONOSCOPY W/ POLYPECTOMY    . EYE SURGERY Bilateral 2013  . MULTIPLE TOOTH EXTRACTIONS    . RADIOLOGY WITH ANESTHESIA Left 09/09/2018   Procedure: MRI WITH ANESTHESIA LEFT SHOULDER WITH CONTRAST;  Surgeon: Radiologist, Medication, MD;  Location: [facility];  Service: Radiology;  Laterality: Left;  . TONSILLECTOMY    . TUBAL LIGATION      There were no vitals filed for this visit.  Subjective Assessment - 09/15/18 0859    Subjective  Patient missed monday due to being sick. Patient  had her MRI and will go next tuesday to hear the results.     Pertinent History  Patient is a pleasant 63 year old female who presents for L shoulder pain and limited ROM s/p R basal ganglia CVA 8/08. Pt was hospitalized for 1 week where she received inpatient therapy and was then discharged home where she received home health therapy for 2 weeks. PMH includes: ulcerative colitis, stroke, HLD, DM, asthma.  Prior to CVA, pt was independent for ADLs and IADLs. Patient reports she has been having pain in L shoulder since CVA and is unable to lift arm to perform ADL/iADLs such as dressing, doing hair/makeup. Etc. Reports pain is sore/throbbing. Per patient report X ray and nerve testing report were clear    Limitations  Sitting;Lifting;Walking;House hold activities;Writing;Other (comment)    How long can you sit comfortably?  supports L arm with R hand    How long can you stand comfortably?  n/a    How long can you walk comfortably?  limited swing of L arm    Diagnostic tests  X ray, nerve conduction test, getting MRI on 09/09/18    Patient Stated Goals  reduce pain, improve ROM and strength to be able to reach into counter, do hair and makeup,  tie shoes.     Currently in Pain?  Yes    Pain Score  4     Pain Location  Shoulder    Pain Orientation  Left    Pain Descriptors / Indicators  Aching;Sore    Pain Type  Chronic pain    Pain Onset  More than a month ago    Pain Frequency  Intermittent       TREATMENT:   Therapeutic exercises:   Supine AAROM with R hand leading L 10x >90 degrees each time with gradual increase each attempt  Sci fit: sitting on airex pad to decrease angle of flexion: 2 minutes forward no resistance, 4 minutes backwards no resistance, tactile cueing for body mechanics, increasing angle of flexion and extension with repetition.  Pulleys: seated: educated on use, hand position and body mechanics (added to HEP)   Flexion 10x  Abduction -unable to perform.    Manual therapy:   Inferior GH mobilizations 10x 10 second holds Grade I-II for pain relief PA GH mobilizations 10x 10 second holds Grade I-II for pain relief   K tape performed with three pieces: Two AP for right scapular depression, one A to P laterally for ER. Patient reports immediate pain relief and performs AROM of ~15 degrees.   Patient educated on purse lipped breathing technique for pain reduction, intermittently throughout session.                             PT Education - 09/15/18 1022    Education Details  K tape, Sci Fit, Pulleys    Person(s) Educated  Patient    Methods  Explanation;Tactile cues;Demonstration;Verbal cues;Handout    Comprehension  Verbalized understanding;Returned demonstration;Verbal cues required;Tactile cues required;Need further instruction       PT Short Term Goals - 08/19/18 1101      PT SHORT TERM GOAL #1   Title  Patient will be independent in home exercise program to improve strength/mobility for better functional independence with ADLs.    Baseline  2/6: HEP given     Time  2    Period  Weeks    Status  New    Target Date  09/02/18      PT SHORT TERM GOAL #2   Title  Patient will report a worst pain of 8/10 on VAS in L shoulder to improve tolerance with ADLs and reduced symptoms with activities.     Baseline  2/6: 10/10 pain    Time  2    Period  Weeks    Status  New    Target Date  09/02/18        PT Long Term Goals - 08/19/18 1102      PT LONG TERM GOAL #1   Title  Patient will report a worst pain of 4/10 on VAS in L shoulder to improve tolerance with ADLs and reduced symptoms with activities.     Baseline  2/6: 10/10     Time  8    Period  Weeks    Status  New    Target Date  10/14/18      PT LONG TERM GOAL #2   Title  Patient will improve shoulder AROM to > 115 degrees of flexion  and abduction for improved ability to perform overhead activities.    Baseline  2/6: AROM: flexion 50degrees with elbow bent, 24 with  elbow bent (unable to straighten arm)  Time  8    Period  Weeks    Status  New    Target Date  10/14/18      PT LONG TERM GOAL #3   Title  Patient will decrease Quick DASH score by > 8 points (to <85%) demonstrating reduced self-reported upper extremity disability.    Baseline  2/6: 93.2%    Time  8    Period  Weeks    Status  New    Target Date  10/14/18      PT LONG TERM GOAL #4   Title  Patient will demonstrate adequate shoulder ROM and strength (>3+/5) to be able to dress, perform hair care, tie shoes, and don undergarments.     Baseline  2/6: unable to use LUE for any ADLs such as dressing    Time  8    Period  Weeks    Status  New    Target Date  10/14/18            Plan - 09/15/18 1024    Clinical Impression Statement  Patient arrived late to session limiting duration of interventions. Patient shows significant progress with AAROM with ability to perform Sci Fit without resistance for the first time, use pulleys for flexion and perform supine AAROM >90. Patient educated on use of pulley and added to HEP. Overall the patient would benefit from further skilled PT to continue to progress ROM, strength, and tolerance to activity with continued pain education/pain management techniques as well.    Rehab Potential  Fair    Clinical Impairments Affecting Rehab Potential  (+) motivated, external support system, age/fitness prior to CVA (-) chronicity, severity of pain, no MRI/known etiology    PT Frequency  2x / week    PT Duration  8 weeks    PT Treatment/Interventions  ADLs/Self Care Home Management;Aquatic Therapy;Biofeedback;Cryotherapy;Electrical Stimulation;Iontophoresis 69m/ml Dexamethasone;Moist Heat;Traction;Ultrasound;Functional mobility training;Therapeutic activities;Therapeutic exercise;Patient/family education;Neuromuscular re-education;Manual techniques;Energy conservation;Passive range of motion;Compression bandaging;Splinting;Taping    PT Next Visit Plan  review  HEP, STM to musculature, PROM, pain control    PT Home Exercise Plan  see above    Consulted and Agree with Plan of Care  Patient       Patient will benefit from skilled therapeutic intervention in order to improve the following deficits and impairments:  Decreased activity tolerance, Decreased coordination, Decreased endurance, Decreased mobility, Decreased range of motion, Decreased strength, Increased fascial restricitons, Hypomobility, Increased muscle spasms, Impaired perceived functional ability, Impaired flexibility, Impaired UE functional use, Impaired sensation, Improper body mechanics, Postural dysfunction, Pain  Visit Diagnosis: Muscle weakness (generalized)  Other lack of coordination  Chronic left shoulder pain  Stiffness of left shoulder, not elsewhere classified     Problem List Patient Active Problem List   Diagnosis Date Noted  . Postural dizziness 08/04/2018  . Migraine 02/23/2018  . HIV disease ([place]   . Stroke (cerebrum) ([place] - R basal ganglia d/t small vessel dz 02/18/2018  . HLD (hyperlipidemia) 08/02/2014  . Diabetes mellitus type 2, uncontrolled, with complications ([place] 017/71/1657 . Ulcerative colitis ([place] 06/26/2008  . Depression, recurrent ([place] 04/04/2008   MJanna Arch PT, DPT   09/15/2018, 10:25 AM  [place] [facility] [address] [place] [address]Phone: [phone]  Fax:  [phone] Name: Shannon KendraMRN: [id number] of Birth: 708-Sep-1957

## 2018-09-16 ENCOUNTER — Encounter: Payer: Self-pay | Admitting: Internal Medicine

## 2018-09-16 ENCOUNTER — Ambulatory Visit (INDEPENDENT_AMBULATORY_CARE_PROVIDER_SITE_OTHER): Payer: BLUE CROSS/BLUE SHIELD | Admitting: Internal Medicine

## 2018-09-16 VITALS — BP 126/74 | HR 88 | Temp 97.9°F | Wt 139.0 lb

## 2018-09-16 DIAGNOSIS — J301 Allergic rhinitis due to pollen: Secondary | ICD-10-CM

## 2018-09-16 DIAGNOSIS — G8929 Other chronic pain: Secondary | ICD-10-CM

## 2018-09-16 DIAGNOSIS — Z0289 Encounter for other administrative examinations: Secondary | ICD-10-CM | POA: Diagnosis not present

## 2018-09-16 DIAGNOSIS — M25512 Pain in left shoulder: Secondary | ICD-10-CM

## 2018-09-16 NOTE — Progress Notes (Signed)
HPI  Pt presents to the clinic today with c/o sneezing, runny nose and cough. She reports this started 5-6 days ago. She is blowing clear mucous out of her nose. The cough is non productive. She denies ear pain, nasal congestion, sore throat or shortness of breath. She denies fever, chills or body aches. She denies fever, chills or body aches. She has not taken anything OTC for her symptoms.   She also reports she recently had a MRI of her left shoulder due to chronic pain and weakness after her stroke. She has an appt on Monday to review it with orthopedics but she would like to discuss the results now if able.   She also has a form that she needs completed in order to have her student loan discharged. She is unable to work due to stroke with left hemiparesis.  Review of Systems      Past Medical History:  Diagnosis Date  . Asthma   . Diabetes mellitus without complication ([place]) 1-66-06   hx. NIDDM-dx. 3 weeks ago  . Headache    migraines  . HIV (human immunodeficiency virus infection) ([place])   . Hyperlipidemia    hx  . Seasonal allergies   . Stroke ([place])   . Ulcerative colitis ([place])   . Wears contact lenses   . Wears partial dentures     Family History  Problem Relation Age of Onset  . Hypertension Mother   . Diabetes Mother   . Cancer Father        lung ca  . Seizures Daughter        42yr    Social History   Socioeconomic History  . Marital status: Legally Separated    Spouse name: Not on file  . Number of children: Not on file  . Years of education: Not on file  . Highest education level: Not on file  Occupational History  . Not on file  Social Needs  . Financial resource strain: Not on file  . Food insecurity:    Worry: Not on file    Inability: Not on file  . Transportation needs:    Medical: Not on file    Non-medical: Not on file  Tobacco Use  . Smoking status: Never Smoker  . Smokeless tobacco: Never Used  Substance and Sexual Activity  . Alcohol  use: Not Currently    Alcohol/week: 0.0 standard drinks  . Drug use: No  . Sexual activity: Not Currently    Partners: Male    Comment: offered condoms  Lifestyle  . Physical activity:    Days per week: Not on file    Minutes per session: Not on file  . Stress: Not on file  Relationships  . Social connections:    Talks on phone: Not on file    Gets together: Not on file    Attends religious service: Not on file    Active member of club or organization: Not on file    Attends meetings of clubs or organizations: Not on file    Relationship status: Not on file  . Intimate partner violence:    Fear of current or ex partner: Not on file    Emotionally abused: Not on file    Physically abused: Not on file    Forced sexual activity: Not on file  Other Topics Concern  . Not on file  Social History Narrative  . Not on file    Allergies  Allergen Reactions  . Azithromycin Itching  Muscle aches  . Erythromycin Nausea And Vomiting  . Penicillins Nausea And Vomiting    Did it involve swelling of the face/tongue/throat, SOB, or low BP? No Did it involve sudden or severe rash/hives, skin peeling, or any reaction on the inside of your mouth or nose? No Did you need to seek medical attention at a hospital or doctor's office? No When did it last happen?childhood allergy If all above answers are "NO", may proceed with cephalosporin use.   . Tetracycline Nausea And Vomiting     Constitutional: Denies headache, fatigue, fever or abrupt weight changes.  HEENT:  Positive runny nose. Denies eye redness, eye pain, pressure behind the eyes, facial pain, nasal congestion, ear pain, ringing in the ears, wax buildup, or sore throat. Respiratory: Positive cough. Denies difficulty breathing or shortness of breath.  Cardiovascular: Denies chest pain, chest tightness, palpitations or swelling in the hands or feet.  MSK: Pt reports left arm pain and weakness. Denies muscle pain, difficulty  with gait.  No other specific complaints in a complete review of systems (except as listed in HPI above).  Objective:   BP 126/74   Pulse 88   Temp 97.9 F (36.6 C) (Oral)   Wt 139 lb (63 kg)   BMI 25.42 kg/m  Wt Readings from Last 3 Encounters:  09/16/18 139 lb (63 kg)  09/09/18 140 lb (63.5 kg)  08/04/18 142 lb 8 oz (64.6 kg)     General: Appears her stated age, well developed, well nourished in NAD. HEENT: Head: normal shape and size, no sinus tenderness  noted;  Ears: Tm's gray and intact, normal light reflex; Nose: mucosa boggy and moist, turbinates swollen; Throat/Mouth: + PND. Teeth present, mucosa pink and moist, no exudate noted, no lesions or ulcerations noted.  Neck: No cervical lymphadenopathy.  Cardiovascular: Normal rate and rhythm. S1,S2 noted.  No murmur, rubs or gallops noted.  Pulmonary/Chest: Normal effort and positive vesicular breath sounds. No respiratory distress. No wheezes, rales or ronchi noted.  MSK: Decreased external rotation of the left shoulder. Normal internal rotation of the left shoulder. Strength 4/5 LUE.      Assessment & Plan:   Allergic Rhinitis:  Start Zyrtec and Flonase OTC  Chronic Left Shoulder Pain:  MRI reviewed with pt Follow up with ortho as planned  Encounter for Form Completion:  Will fill out form and let pt know when ready to be picked up  RTC as needed or if symptoms persist.   Webb Silversmith, NP

## 2018-09-16 NOTE — Patient Instructions (Signed)

## 2018-09-20 ENCOUNTER — Encounter: Payer: Self-pay | Admitting: Occupational Therapy

## 2018-09-20 ENCOUNTER — Ambulatory Visit: Payer: BLUE CROSS/BLUE SHIELD | Admitting: Occupational Therapy

## 2018-09-20 ENCOUNTER — Ambulatory Visit: Payer: BLUE CROSS/BLUE SHIELD

## 2018-09-20 DIAGNOSIS — M25612 Stiffness of left shoulder, not elsewhere classified: Secondary | ICD-10-CM | POA: Diagnosis not present

## 2018-09-20 DIAGNOSIS — G8929 Other chronic pain: Secondary | ICD-10-CM

## 2018-09-20 DIAGNOSIS — M6281 Muscle weakness (generalized): Secondary | ICD-10-CM

## 2018-09-20 DIAGNOSIS — M25512 Pain in left shoulder: Secondary | ICD-10-CM | POA: Diagnosis not present

## 2018-09-20 DIAGNOSIS — M7541 Impingement syndrome of right shoulder: Secondary | ICD-10-CM | POA: Diagnosis not present

## 2018-09-20 DIAGNOSIS — R278 Other lack of coordination: Secondary | ICD-10-CM

## 2018-09-20 NOTE — Therapy (Signed)
[place] MAIN [facility] SERVICES [address] [place], [place], [address] Phone: [phone]   Fax:  [phone]  Occupational Therapy Treatment  Patient Details  Name: [name] MRN: [id number] Date of Birth: [birth year] No data recorded  Encounter Date: 09/20/2018  OT End of Session - 09/20/18 1415    Visit Number  33    Number of Visits  79    Date for OT Re-Evaluation  12/08/18    Authorization Type  Progress report period starting 09/07/2018    OT Start Time  1125    OT Stop Time  1145    OT Time Calculation (min)  20 min    Activity Tolerance  Patient tolerated treatment well    Behavior During Therapy  [facility] for tasks assessed/performed       Past Medical History:  Diagnosis Date  . Asthma   . Diabetes mellitus without complication ([place]) 5-95-63   hx. NIDDM-dx. 3 weeks ago  . Headache    migraines  . HIV (human immunodeficiency virus infection) ([place])   . Hyperlipidemia    hx  . Seasonal allergies   . Stroke ([place])   . Ulcerative colitis ([place])   . Wears contact lenses   . Wears partial dentures     Past Surgical History:  Procedure Laterality Date  . BUNIONECTOMY     bilateral and toe nails of big toes removed  . CESAREAN SECTION  1997/2006  . CHOLECYSTECTOMY N/A 12/01/2012   Procedure: LAPAROSCOPIC CHOLECYSTECTOMY;  Surgeon: Stark Klein, MD;  Location: [facility];  Service: General;  Laterality: N/A;  . COLONOSCOPY W/ POLYPECTOMY    . EYE SURGERY Bilateral 2013  . MULTIPLE TOOTH EXTRACTIONS    . RADIOLOGY WITH ANESTHESIA Left 09/09/2018   Procedure: MRI WITH ANESTHESIA LEFT SHOULDER WITH CONTRAST;  Surgeon: Radiologist, Medication, MD;  Location: [place];  Service: Radiology;  Laterality: Left;  . TONSILLECTOMY    . TUBAL LIGATION      There were no vitals filed for this visit.  Subjective Assessment - 09/20/18 1414    Subjective   Pt. was very late for the appointment secondary to having an orthopedic MD appointment.     Pertinent History  Pt is a 63 y.o. female presenting with LUE weakness and decreased [place] as a result of R basal ganglia CVA on 8/08. Pt was hospitalized for 1 week where she received inpatient therapy and was then discharged home where she received home health therapy for 2 weeks. Prior to CVA, pt was independent for ADLs and IADLs. Currently, pt requires increased assistance, increased time, and/or modified strategies for ADLs and IADLs.     Patient Stated Goals  Pt wants to be able to do everything with her L hand that her R hand can do, including making wigs, picking up laundry baskets, manipulating coins for shopping, and grooming hair.    Currently in Pain?  Yes    Pain Score  3     Pain Location  Shoulder    Pain Orientation  Left    Pain Descriptors / Indicators  Aching    Pain Type  Chronic pain    Pain Onset  More than a month ago      OT TREATMENT    Therapeutic Exercise:  Pt. Worked on pinch strengthening in the left hand for lateral, and 3pt. pinch using yellow, red, and green resistive clips. Pt. worked on placing the clips at various vertical and horizontal angles. Tactile and  verbal cues were required for eliciting the desired movement. Pt. Worked on grasping one inch resistive cubes alternating thumb opposition to the tip of the 2nd digit. The board was positioned at a vertical angle. Pt. Worked on pressing them back into place while isolating 2nd digit.                        OT Education - 09/20/18 1415    Education Details  Left hand [place] skills    Person(s) Educated  Patient    Methods  Explanation;Verbal cues;Demonstration    Comprehension  Verbalized understanding;Verbal cues required          OT Long Term Goals - 09/15/18 1005      OT LONG TERM GOAL #1   Title  Pt will improve [place] by 10 secs of speed in order to independently manipulate coins during grocery shopping    Baseline  09/15/2018: Left hand [facility] skills are improving consistently  improving. Pt. continues to have difficulty manipulating small objects, and coins.    Time  12    Period  Weeks    Status  On-going    Target Date  12/08/18      OT LONG TERM GOAL #2   Title  Pt. will perform light homemaking tasks with modified independence using energy conservation/work simplification techniques as needed.    Baseline  09/15/2018: Pt. continues to work on improving home management tasks  using energy conservation, and work simplification techniques.    Time  12    Period  Weeks    Status  On-going    Target Date  09/15/18      OT LONG TERM GOAL #4   Title  Pt. will perform hair/wig care with modified indpendence using compensatory strategies.     Baseline  09/15/2018: Pt. continues to  work on hand function skills in order to be able to perform hair/wig care efficiently    Time  12    Period  Weeks    Status  On-going    Target Date  12/08/18      OT LONG TERM GOAL #5   Title  Pt. will manipulate small buttons with modified independence.    Baseline  09/15/2018: Pt continues to have difficulty with buttoning.    Time  12    Period  Weeks    Status  On-going    Target Date  12/08/18            Plan - 09/20/18 1416    Clinical Impression Statement  Pt. reports that she has to have surgery on her Left shoulder, and is scheduled for next Thursday. Pt. is making steady progress with left hand [facility] skills, and is now able to formulate a fist. Pt. continnues to present with limited motor control, [facility] skills, grip, snf pinch strength. Pt's left shoulder limitations, and pain limit overall progress. Continue with POC until surgery, at which time pt, will require a new order to resume OT for stroke related rehab when the time is appropriate.    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 Pt enjoys singing for leisure however has not been  able to do so since experiencing CVA.    Occupational performance deficits (Please refer to evaluation for details):  ADL's;IADL's;Leisure    Rehab Potential  Good    Clinical Decision Making  Several treatment options, min-mod task modification necessary    OT Frequency  2x / week    OT Duration  12 weeks    OT Treatment/Interventions  Self-care/ADL training;Therapeutic exercise;Neuromuscular education;Therapeutic activities;Manual Therapy;Passive range of motion    Consulted and Agree with Plan of Care  Patient       Patient will benefit from skilled therapeutic intervention in order to improve the following deficits and impairments:     Visit Diagnosis: Muscle weakness (generalized)  Other lack of coordination    Problem List Patient Active Problem List   Diagnosis Date Noted  . Postural dizziness 08/04/2018  . Migraine 02/23/2018  . HIV disease ([place])   . Stroke (cerebrum) ([place]) - R basal ganglia d/t small vessel dz 02/18/2018  . HLD (hyperlipidemia) 08/02/2014  . Diabetes mellitus type 2, uncontrolled, with complications ([place]) 93/23/5573  . Ulcerative colitis ([place]) 06/26/2008  . Depression, recurrent ([place]) 04/04/2008    Harrel Carina, MS, OTR/L 09/20/2018, 2:21 PM  [place] MAIN [facility] SERVICES [address] [place], [place], [address] Phone: [phone]   Fax:  [phone]  Name: [name] MRN: [id number] Date of Birth: 1955/09/03

## 2018-09-20 NOTE — Therapy (Signed)
[place] MAIN [facility] SERVICES [address] [place], [place], [address] Phone: [phone]   Fax:  [phone]  Physical Therapy Treatment  Patient Details  Name: [name] MRN: [id number] Date of Birth: 04/03/1956 Referring Provider (PT): Webb Silversmith   Encounter Date: 09/20/2018  PT End of Session - 09/20/18 0820    Visit Number  8    Number of Visits  16    Date for PT Re-Evaluation  10/14/18    Authorization Type  8/20 visits allowed PT    Authorization Time Period  8/10 eval 2/6     PT Start Time  0815    PT Stop Time  0840    PT Time Calculation (min)  25 min    Activity Tolerance  Patient limited by pain;Patient tolerated treatment well    Behavior During Therapy  [facility] for tasks assessed/performed       Past Medical History:  Diagnosis Date  . Asthma   . Diabetes mellitus without complication ([place]) 4-33-29   hx. NIDDM-dx. 3 weeks ago  . Headache    migraines  . HIV (human immunodeficiency virus infection) ([place])   . Hyperlipidemia    hx  . Seasonal allergies   . Stroke ([place])   . Ulcerative colitis ([place])   . Wears contact lenses   . Wears partial dentures     Past Surgical History:  Procedure Laterality Date  . BUNIONECTOMY     bilateral and toe nails of big toes removed  . CESAREAN SECTION  1997/2006  . CHOLECYSTECTOMY N/A 12/01/2012   Procedure: LAPAROSCOPIC CHOLECYSTECTOMY;  Surgeon: Stark Klein, MD;  [facility];  Service: General;  Laterality: N/A;  . COLONOSCOPY W/ POLYPECTOMY    . EYE SURGERY Bilateral 2013  . MULTIPLE TOOTH EXTRACTIONS    . RADIOLOGY WITH ANESTHESIA Left 09/09/2018   Procedure: MRI WITH ANESTHESIA LEFT SHOULDER WITH CONTRAST;  Surgeon: Radiologist, Medication, MD;  Location: [place];  Service: Radiology;  Laterality: Left;  . TONSILLECTOMY    . TUBAL LIGATION      There were no vitals filed for this visit.  Subjective Assessment - 09/20/18 0818    Subjective  Patient reports  she saw her doctor about her MRI, said she had a lot of arthritis in the shoulder. Couldn't sleep last night so was stretching all last night. Arrived late to session and will have to leave early to go to orthotist.     Pertinent History  Patient is a pleasant 63 year old female who presents for L shoulder pain and limited ROM s/p R basal ganglia CVA 8/08. Pt was hospitalized for 1 week where she received inpatient therapy and was then discharged home where she received home health therapy for 2 weeks. PMH includes: ulcerative colitis, stroke, HLD, DM, asthma.  Prior to CVA, pt was independent for ADLs and IADLs. Patient reports she has been having pain in L shoulder since CVA and is unable to lift arm to perform ADL/iADLs such as dressing, doing hair/makeup. Etc. Reports pain is sore/throbbing. Per patient report X ray and nerve testing report were clear    Limitations  Sitting;Lifting;Walking;House hold activities;Writing;Other (comment)    How long can you sit comfortably?  supports L arm with R hand    How long can you stand comfortably?  n/a    How long can you walk comfortably?  limited swing of L arm    Diagnostic tests  X ray, nerve conduction test, getting MRI  on 09/09/18    Patient Stated Goals  reduce pain, improve ROM and strength to be able to reach into counter, do hair and makeup, tie shoes.     Currently in Pain?  Yes    Pain Score  3     Pain Location  Shoulder    Pain Orientation  Left    Pain Descriptors / Indicators  Aching;Sore    Pain Type  Chronic pain    Pain Onset  More than a month ago    Pain Frequency  Intermittent       Therapeutic exercises:    Supine AAROM with R hand leading L 10x >90 degrees each time with gradual increase each attempt   Sci fit: sitting on airex pad to decrease angle of flexion: 2 minutes forward no resistance, 4 minutes backwards no resistance, tactile cueing for body mechanics, increasing angle of flexion and extension with repetition.    Finger wall walk 8x with max verbal cueing for sequencing, challenging for coordination    Manual therapy:  Inferior GH mobilizations 10x 10 second holds Grade I-II for pain relief PA GH mobilizations 10x 10 second holds Grade I-II for pain relief Bicep and pectoral STM x 2 minutes                    PT Education - 09/20/18 0820    Education Details  posture, muscle recruitment, technique     Person(s) Educated  Patient    Methods  Explanation;Demonstration;Tactile cues;Verbal cues    Comprehension  Verbalized understanding;Returned demonstration;Verbal cues required;Tactile cues required;Need further instruction       PT Short Term Goals - 08/19/18 1101      PT SHORT TERM GOAL #1   Title  Patient will be independent in home exercise program to improve strength/mobility for better functional independence with ADLs.    Baseline  2/6: HEP given     Time  2    Period  Weeks    Status  New    Target Date  09/02/18      PT SHORT TERM GOAL #2   Title  Patient will report a worst pain of 8/10 on VAS in L shoulder to improve tolerance with ADLs and reduced symptoms with activities.     Baseline  2/6: 10/10 pain    Time  2    Period  Weeks    Status  New    Target Date  09/02/18        PT Long Term Goals - 08/19/18 1102      PT LONG TERM GOAL #1   Title  Patient will report a worst pain of 4/10 on VAS in L shoulder to improve tolerance with ADLs and reduced symptoms with activities.     Baseline  2/6: 10/10     Time  8    Period  Weeks    Status  New    Target Date  10/14/18      PT LONG TERM GOAL #2   Title  Patient will improve shoulder AROM to > 115 degrees of flexion  and abduction for improved ability to perform overhead activities.    Baseline  2/6: AROM: flexion 50degrees with elbow bent, 24 with elbow bent (unable to straighten arm)     Time  8    Period  Weeks    Status  New    Target Date  10/14/18      PT LONG TERM GOAL #3  Title  Patient  will decrease Quick DASH score by > 8 points (to <85%) demonstrating reduced self-reported upper extremity disability.    Baseline  2/6: 93.2%    Time  8    Period  Weeks    Status  New    Target Date  10/14/18      PT LONG TERM GOAL #4   Title  Patient will demonstrate adequate shoulder ROM and strength (>3+/5) to be able to dress, perform hair care, tie shoes, and don undergarments.     Baseline  2/6: unable to use LUE for any ADLs such as dressing    Time  8    Period  Weeks    Status  New    Target Date  10/14/18            Plan - 09/20/18 2119    Clinical Impression Statement  Patient arrived late to session and had to leave early limiting session duration. Reports her shoulder is doing better, has been stretching it frequently and moving. Is now able to perform greater ROM with AAROM and AROM with decreased pain. Patient is still fearful of full movement at this time. Overall the patient would benefit from further skilled PT to continue to progress ROM, strength, and tolerance to activity with continued pain education/pain management techniques as well.    Rehab Potential  Fair    Clinical Impairments Affecting Rehab Potential  (+) motivated, external support system, age/fitness prior to CVA (-) chronicity, severity of pain, no MRI/known etiology    PT Frequency  2x / week    PT Duration  8 weeks    PT Treatment/Interventions  ADLs/Self Care Home Management;Aquatic Therapy;Biofeedback;Cryotherapy;Electrical Stimulation;Iontophoresis 24m/ml Dexamethasone;Moist Heat;Traction;Ultrasound;Functional mobility training;Therapeutic activities;Therapeutic exercise;Patient/family education;Neuromuscular re-education;Manual techniques;Energy conservation;Passive range of motion;Compression bandaging;Splinting;Taping    PT Next Visit Plan  review HEP, STM to musculature, PROM, pain control    PT Home Exercise Plan  see above    Consulted and Agree with Plan of Care  Patient       Patient  will benefit from skilled therapeutic intervention in order to improve the following deficits and impairments:  Decreased activity tolerance, Decreased coordination, Decreased endurance, Decreased mobility, Decreased range of motion, Decreased strength, Increased fascial restricitons, Hypomobility, Increased muscle spasms, Impaired perceived functional ability, Impaired flexibility, Impaired UE functional use, Impaired sensation, Improper body mechanics, Postural dysfunction, Pain  Visit Diagnosis: Muscle weakness (generalized)  Other lack of coordination  Chronic left shoulder pain  Stiffness of left shoulder, not elsewhere classified     Problem List Patient Active Problem List   Diagnosis Date Noted  . Postural dizziness 08/04/2018  . Migraine 02/23/2018  . HIV disease ([place]   . Stroke (cerebrum) ([place] - R basal ganglia d/t small vessel dz 02/18/2018  . HLD (hyperlipidemia) 08/02/2014  . Diabetes mellitus type 2, uncontrolled, with complications ([place] 041/74/0814 . Ulcerative colitis ([place] 06/26/2008  . Depression, recurrent ([place] 04/04/2008   MJanna Arch PT, DPT   09/20/2018, 8:43 AM  [place] [facility] [address] [address]Phone: [phone]  Fax:  [phone] Name: MSalinda SnedekerMRN: [id number] of Birth: 712/07/1955

## 2018-09-21 ENCOUNTER — Telehealth: Payer: Self-pay | Admitting: Internal Medicine

## 2018-09-21 DIAGNOSIS — Z0279 Encounter for issue of other medical certificate: Secondary | ICD-10-CM

## 2018-09-21 NOTE — Telephone Encounter (Signed)
Done, placed in my outbox.

## 2018-09-21 NOTE — Telephone Encounter (Signed)
Discharge application:  Total and permanent disability form in [facility] in box

## 2018-09-22 ENCOUNTER — Encounter: Payer: Self-pay | Admitting: Occupational Therapy

## 2018-09-22 ENCOUNTER — Other Ambulatory Visit: Payer: Self-pay | Admitting: Orthopedic Surgery

## 2018-09-22 ENCOUNTER — Ambulatory Visit: Payer: BLUE CROSS/BLUE SHIELD | Admitting: Occupational Therapy

## 2018-09-22 ENCOUNTER — Other Ambulatory Visit: Payer: Self-pay

## 2018-09-22 ENCOUNTER — Ambulatory Visit: Payer: BLUE CROSS/BLUE SHIELD

## 2018-09-22 DIAGNOSIS — M25612 Stiffness of left shoulder, not elsewhere classified: Secondary | ICD-10-CM

## 2018-09-22 DIAGNOSIS — G8929 Other chronic pain: Secondary | ICD-10-CM

## 2018-09-22 DIAGNOSIS — R278 Other lack of coordination: Secondary | ICD-10-CM | POA: Diagnosis not present

## 2018-09-22 DIAGNOSIS — M6281 Muscle weakness (generalized): Secondary | ICD-10-CM | POA: Diagnosis not present

## 2018-09-22 DIAGNOSIS — M25512 Pain in left shoulder: Secondary | ICD-10-CM | POA: Diagnosis not present

## 2018-09-22 NOTE — Therapy (Signed)
[place] MAIN [facility] SERVICES [address] [place], [place], [address] Phone: [phone]   Fax:  [phone]  Physical Therapy Treatment  Patient Details  Name: [name] MRN: [id number] Date of Birth: [birth year] Referring Provider (PT): Webb Silversmith   Encounter Date: 09/22/2018  PT End of Session - 09/22/18 0957    Visit Number  9    Number of Visits  16    Date for PT Re-Evaluation  10/14/18    Authorization Type  9/20 visits allowed PT    Authorization Time Period  9/10 eval 2/6     PT Start Time  0948    PT Stop Time  1015    PT Time Calculation (min)  27 min    Activity Tolerance  Patient limited by pain;Patient tolerated treatment well    Behavior During Therapy  [facility] for tasks assessed/performed       Past Medical History:  Diagnosis Date  . Asthma   . Diabetes mellitus without complication ([place]) 7-34-28   hx. NIDDM-dx. 3 weeks ago  . Headache    migraines  . HIV (human immunodeficiency virus infection) ([place])   . Hyperlipidemia    hx  . Seasonal allergies   . Stroke ([place])   . Ulcerative colitis ([place])   . Wears contact lenses   . Wears partial dentures     Past Surgical History:  Procedure Laterality Date  . BUNIONECTOMY     bilateral and toe nails of big toes removed  . CESAREAN SECTION  1997/2006  . CHOLECYSTECTOMY N/A 12/01/2012   Procedure: LAPAROSCOPIC CHOLECYSTECTOMY;  Surgeon: Stark Klein, MD;  [facility];  Service: General;  Laterality: N/A;  . COLONOSCOPY W/ POLYPECTOMY    . EYE SURGERY Bilateral 2013  . MULTIPLE TOOTH EXTRACTIONS    . RADIOLOGY WITH ANESTHESIA Left 09/09/2018   Procedure: MRI WITH ANESTHESIA LEFT SHOULDER WITH CONTRAST;  Surgeon: Radiologist, Medication, MD;  Location: [place];  Service: Radiology;  Laterality: Left;  . TONSILLECTOMY    . TUBAL LIGATION      There were no vitals filed for this visit.  Subjective Assessment - 09/22/18 0951    Subjective  Patient will be  recieving surgery on 3/19: shoulder arthroscopy with distal clavical excision with RUE.  Patient aware of need for new referral after surgery.  Did HEP yesterday.     Pertinent History  Patient is a pleasant 63 year old female who presents for L shoulder pain and limited ROM s/p R basal ganglia CVA 8/08. Pt was hospitalized for 1 week where she received inpatient therapy and was then discharged home where she received home health therapy for 2 weeks. PMH includes: ulcerative colitis, stroke, HLD, DM, asthma.  Prior to CVA, pt was independent for ADLs and IADLs. Patient reports she has been having pain in L shoulder since CVA and is unable to lift arm to perform ADL/iADLs such as dressing, doing hair/makeup. Etc. Reports pain is sore/throbbing. Per patient report X ray and nerve testing report were clear    Limitations  Sitting;Lifting;Walking;House hold activities;Writing;Other (comment)    How long can you sit comfortably?  supports L arm with R hand    How long can you stand comfortably?  n/a    How long can you walk comfortably?  limited swing of L arm    Diagnostic tests  X ray, nerve conduction test, getting MRI on 09/09/18    Patient Stated Goals  reduce pain, improve ROM and  strength to be able to reach into counter, do hair and makeup, tie shoes.     Currently in Pain?  Yes    Pain Score  5     Pain Location  Shoulder    Pain Orientation  Left    Pain Descriptors / Indicators  Aching    Pain Type  Chronic pain    Pain Onset  More than a month ago    Pain Frequency  Intermittent           Ask about surgery on 3/19: shoulder arthroscopy with distal clavicle excision right UE.   Therapeutic exercises:    Supine AAROM with R hand leading L 10x >90 degrees each time with gradual increase each attempt   Sci fit: sitting on airex pad to decrease angle of flexion: 2 minutes forward no resistance, 4 minutes backwards no resistance, tactile cueing for body mechanics, increasing angle of  flexion and extension with repetition.  AAROM on UE ranger flexion 10x, abduction 10x, circles clockwise 10x, counterclockwise 10x, placed 1 foot laterally, abduction 10x    Manual therapy:  Inferior GH mobilizations 10x 10 second holds Grade I-II for pain relief PA GH mobilizations 10x 10 second holds Grade I-II for pain relief Bicep and pectoral STM x 2 minutes                     PT Education - 09/22/18 0957    Education Details  need for referral s/p surgery, muscle recruitment.     Person(s) Educated  Patient    Methods  Explanation;Demonstration;Tactile cues;Verbal cues    Comprehension  Verbalized understanding;Returned demonstration;Verbal cues required;Tactile cues required;Need further instruction       PT Short Term Goals - 08/19/18 1101      PT SHORT TERM GOAL #1   Title  Patient will be independent in home exercise program to improve strength/mobility for better functional independence with ADLs.    Baseline  2/6: HEP given     Time  2    Period  Weeks    Status  New    Target Date  09/02/18      PT SHORT TERM GOAL #2   Title  Patient will report a worst pain of 8/10 on VAS in L shoulder to improve tolerance with ADLs and reduced symptoms with activities.     Baseline  2/6: 10/10 pain    Time  2    Period  Weeks    Status  New    Target Date  09/02/18        PT Long Term Goals - 08/19/18 1102      PT LONG TERM GOAL #1   Title  Patient will report a worst pain of 4/10 on VAS in L shoulder to improve tolerance with ADLs and reduced symptoms with activities.     Baseline  2/6: 10/10     Time  8    Period  Weeks    Status  New    Target Date  10/14/18      PT LONG TERM GOAL #2   Title  Patient will improve shoulder AROM to > 115 degrees of flexion  and abduction for improved ability to perform overhead activities.    Baseline  2/6: AROM: flexion 50degrees with elbow bent, 24 with elbow bent (unable to straighten arm)     Time  8     Period  Weeks    Status  New  Target Date  10/14/18      PT LONG TERM GOAL #3   Title  Patient will decrease Quick DASH score by > 8 points (to <85%) demonstrating reduced self-reported upper extremity disability.    Baseline  2/6: 93.2%    Time  8    Period  Weeks    Status  New    Target Date  10/14/18      PT LONG TERM GOAL #4   Title  Patient will demonstrate adequate shoulder ROM and strength (>3+/5) to be able to dress, perform hair care, tie shoes, and don undergarments.     Baseline  2/6: unable to use LUE for any ADLs such as dressing    Time  8    Period  Weeks    Status  New    Target Date  10/14/18            Plan - 09/22/18 1058    Clinical Impression Statement  Patient will be receiving surgery on 3/19 for her R shoulder. She will be having a shoulder arthroscopy with distal clavicle excision. Patient is aware of need for new referral s/p surgery. Patient arrived late limiting session duration. . Overall the patient would benefit from further skilled PT to continue to progress ROM, strength, and tolerance to activity with continued pain education/pain management techniques as well.    Rehab Potential  Fair    Clinical Impairments Affecting Rehab Potential  (+) motivated, external support system, age/fitness prior to CVA (-) chronicity, severity of pain, no MRI/known etiology    PT Frequency  2x / week    PT Duration  8 weeks    PT Treatment/Interventions  ADLs/Self Care Home Management;Aquatic Therapy;Biofeedback;Cryotherapy;Electrical Stimulation;Iontophoresis 49m/ml Dexamethasone;Moist Heat;Traction;Ultrasound;Functional mobility training;Therapeutic activities;Therapeutic exercise;Patient/family education;Neuromuscular re-education;Manual techniques;Energy conservation;Passive range of motion;Compression bandaging;Splinting;Taping    PT Next Visit Plan  review HEP, STM to musculature, PROM, pain control    PT Home Exercise Plan  see above    Consulted and Agree  with Plan of Care  Patient       Patient will benefit from skilled therapeutic intervention in order to improve the following deficits and impairments:  Decreased activity tolerance, Decreased coordination, Decreased endurance, Decreased mobility, Decreased range of motion, Decreased strength, Increased fascial restricitons, Hypomobility, Increased muscle spasms, Impaired perceived functional ability, Impaired flexibility, Impaired UE functional use, Impaired sensation, Improper body mechanics, Postural dysfunction, Pain  Visit Diagnosis: Muscle weakness (generalized)  Other lack of coordination  Chronic left shoulder pain  Stiffness of left shoulder, not elsewhere classified     Problem List Patient Active Problem List   Diagnosis Date Noted  . Postural dizziness 08/04/2018  . Migraine 02/23/2018  . HIV disease ([place]   . Stroke (cerebrum) ([place] - R basal ganglia d/t small vessel dz 02/18/2018  . HLD (hyperlipidemia) 08/02/2014  . Diabetes mellitus type 2, uncontrolled, with complications ([place] 097/94/8016 . Ulcerative colitis ([place] 06/26/2008  . Depression, recurrent ([place] 04/04/2008   MJanna Arch PT, DPT   09/22/2018, 11:00 AM  [place] [facility] [address] [address]Phone: [phone]  Fax:  [phone] Name: MRina AdneyMRN: [id number] of Birth: 709/10/1955

## 2018-09-22 NOTE — Telephone Encounter (Signed)
Pt aware paperwork is ready She will pick up and turn in Copy for  Pt Copy for scan Copy for billing

## 2018-09-22 NOTE — Therapy (Signed)
[place] MAIN [facility] SERVICES [address] [place], [place], [address] Phone: [phone]   Fax:  [phone]  Occupational Therapy Treatment  Patient Details  Name: [name] MRN: [id number] Date of Birth: 1956-03-07 No data recorded  Encounter Date: 09/22/2018  OT End of Session - 09/22/18 1027    Visit Number  34    Number of Visits  31    Date for OT Re-Evaluation  12/08/18    Authorization Type  Progress report period starting 09/07/2018    OT Start Time  1015    OT Stop Time  1100    OT Time Calculation (min)  45 min    Activity Tolerance  Patient tolerated treatment well    Behavior During Therapy  [facility] for tasks assessed/performed       Past Medical History:  Diagnosis Date  . Asthma   . Diabetes mellitus without complication ([place]) 6-75-44   hx. NIDDM-dx. 3 weeks ago  . Headache    migraines  . HIV (human immunodeficiency virus infection) ([place])   . Hyperlipidemia    hx  . Seasonal allergies   . Stroke ([place])   . Ulcerative colitis ([place])   . Wears contact lenses   . Wears partial dentures     Past Surgical History:  Procedure Laterality Date  . BUNIONECTOMY     bilateral and toe nails of big toes removed  . CESAREAN SECTION  1997/2006  . CHOLECYSTECTOMY N/A 12/01/2012   Procedure: LAPAROSCOPIC CHOLECYSTECTOMY;  Surgeon: Stark Klein, MD;  [facility];  Service: General;  Laterality: N/A;  . COLONOSCOPY W/ POLYPECTOMY    . EYE SURGERY Bilateral 2013  . MULTIPLE TOOTH EXTRACTIONS    . RADIOLOGY WITH ANESTHESIA Left 09/09/2018   Procedure: MRI WITH ANESTHESIA LEFT SHOULDER WITH CONTRAST;  Surgeon: Radiologist, Medication, MD;  Location: [place];  Service: Radiology;  Laterality: Left;  . TONSILLECTOMY    . TUBAL LIGATION      There were no vitals filed for this visit.  Subjective Assessment - 09/22/18 1025    Subjective   Pt. reports her hands ar cold today.    Pertinent History  Pt is a 63 y.o. female  presenting with LUE weakness and decreased [place] as a result of R basal ganglia CVA on 8/08. Pt was hospitalized for 1 week where she received inpatient therapy and was then discharged home where she received home health therapy for 2 weeks. Prior to CVA, pt was independent for ADLs and IADLs. Currently, pt requires increased assistance, increased time, and/or modified strategies for ADLs and IADLs.     Patient Stated Goals  Pt wants to be able to do everything with her L hand that her R hand can do, including making wigs, picking up laundry baskets, manipulating coins for shopping, and grooming hair.    Currently in Pain?  Yes    Pain Score  3     Pain Location  Shoulder    Pain Orientation  Left    Pain Descriptors / Indicators  Sore    Pain Type  Chronic pain       OT TREATMENT    Neuro muscular re-education:  Pt. worked on grasping, and manipulating 1/2" washers from a flat tabletop surface with extending digits between each rep, followed by grasping them from a magnetic dish using point grasp pattern. Pt. worked on placing them onto a horizontal dowel, and removing them grasping with her 2nd , and 3rd digits ,  and thumb. Pt. worked on translatory movements of the hand working successfully from [employer], and 1/2" flat marbles. Pt. Then moved to 1/2" flat washers, however dropped multiple washers form the palm of her hand.   Therapeutic Exercise:  Pt. worked on fisting exercises in the left hand, and intrinsic stretches in formulating a fist. Pt. Initially had difficulty with 2nd digit flexion, however after the exercises was able to achieve it. Pt. worked on pinch strengthening in the left hand for lateral, and 3pt. pinch using yellow, red, and green resistive clips. Pt. worked on placing the clips at horizontal angles at the table top. Tactile and verbal cues were required for eliciting the desired movement. Pt. was able to complete the green clips with lateral  pinch.                          OT Education - 09/22/18 1027    Education Details  Left hand [place] skills    Person(s) Educated  Patient    Methods  Explanation;Verbal cues;Demonstration    Comprehension  Verbalized understanding;Verbal cues required          OT Long Term Goals - 09/15/18 1005      OT LONG TERM GOAL #1   Title  Pt will improve [place] by 10 secs of speed in order to independently manipulate coins during grocery shopping    Baseline  09/15/2018: Left hand [facility] skills are improving consistently improving. Pt. continues to have difficulty manipulating small objects, and coins.    Time  12    Period  Weeks    Status  On-going    Target Date  12/08/18      OT LONG TERM GOAL #2   Title  Pt. will perform light homemaking tasks with modified independence using energy conservation/work simplification techniques as needed.    Baseline  09/15/2018: Pt. continues to work on improving home management tasks  using energy conservation, and work simplification techniques.    Time  12    Period  Weeks    Status  On-going    Target Date  09/15/18      OT LONG TERM GOAL #4   Title  Pt. will perform hair/wig care with modified indpendence using compensatory strategies.     Baseline  09/15/2018: Pt. continues to  work on hand function skills in order to be able to perform hair/wig care efficiently    Time  12    Period  Weeks    Status  On-going    Target Date  12/08/18      OT LONG TERM GOAL #5   Title  Pt. will manipulate small buttons with modified independence.    Baseline  09/15/2018: Pt continues to have difficulty with buttoning.    Time  12    Period  Weeks    Status  On-going    Target Date  12/08/18            Plan - 09/22/18 1035    Clinical Impression Statement  Pt. is anticipating left shoulder surgery for next Thursday. Pt. has preop on Monday. Pt. reports that she has not had surgery since having her children.  Pt. is improving with  formulating a fist, however presented with limited second digit flexion. Pt. continues to work on improving LUE strength, and [facility] skills in order to improve ADL, and IADL functioning.    Occupational Profile and client history currently impacting functional  performance  Pt is a wife and god-mother and was previously employed as a Radiation protection practitioner. Pt enjoys singing for leisure however has not been able to do so since experiencing CVA.    Occupational performance deficits (Please refer to evaluation for details):  ADL's;IADL's;Leisure    Rehab Potential  Good    Clinical Decision Making  Several treatment options, min-mod task modification necessary    OT Frequency  2x / week    OT Duration  12 weeks    OT Treatment/Interventions  Self-care/ADL training;Therapeutic exercise;Neuromuscular education;Therapeutic activities;Manual Therapy;Passive range of motion    Consulted and Agree with Plan of Care  Patient       Patient will benefit from skilled therapeutic intervention in order to improve the following deficits and impairments:     Visit Diagnosis: Muscle weakness (generalized)  Other lack of coordination    Problem List Patient Active Problem List   Diagnosis Date Noted  . Postural dizziness 08/04/2018  . Migraine 02/23/2018  . HIV disease ([place])   . Stroke (cerebrum) ([place]) - R basal ganglia d/t small vessel dz 02/18/2018  . HLD (hyperlipidemia) 08/02/2014  . Diabetes mellitus type 2, uncontrolled, with complications ([place]) 81/85/6314  . Ulcerative colitis ([place]) 06/26/2008  . Depression, recurrent ([place]) 04/04/2008    Harrel Carina, MS, OTR/L 09/22/2018, 5:40 PM  [place] MAIN [facility] SERVICES [address] [place], [place], [address] Phone: [phone]   Fax:  [phone]  Name: [name] MRN: [id number] Date of Birth: 04-08-1956

## 2018-09-23 ENCOUNTER — Other Ambulatory Visit: Payer: BLUE CROSS/BLUE SHIELD

## 2018-09-27 ENCOUNTER — Other Ambulatory Visit: Payer: Self-pay

## 2018-09-27 ENCOUNTER — Other Ambulatory Visit: Payer: BLUE CROSS/BLUE SHIELD

## 2018-09-27 ENCOUNTER — Encounter
Admission: RE | Admit: 2018-09-27 | Discharge: 2018-09-27 | Disposition: A | Discharge status: Home / Self Care | Payer: BLUE CROSS/BLUE SHIELD | Source: Ambulatory Visit | Attending: Orthopedic Surgery | Admitting: Orthopedic Surgery

## 2018-09-27 DIAGNOSIS — Z01818 Encounter for other preprocedural examination: Secondary | ICD-10-CM | POA: Diagnosis not present

## 2018-09-27 DIAGNOSIS — B20 Human immunodeficiency virus [HIV] disease: Secondary | ICD-10-CM | POA: Diagnosis not present

## 2018-09-27 DIAGNOSIS — E119 Type 2 diabetes mellitus without complications: Secondary | ICD-10-CM | POA: Diagnosis not present

## 2018-09-27 LAB — BASIC METABOLIC PANEL
Anion gap: 7 (ref 5–15)
BUN: 12 mg/dL (ref 8–23)
CO2: 22 mmol/L (ref 22–32)
Calcium: 9 mg/dL (ref 8.9–10.3)
Chloride: 111 mmol/L (ref 98–111)
Creatinine, Ser: 0.83 mg/dL (ref 0.44–1.00)
GFR calc Af Amer: >60 mL/min (ref >60)
GFR calc non Af Amer: >60 mL/min (ref >60)
Glucose, Bld: 104 mg/dL — ABNORMAL HIGH (ref 70–99)
Potassium: 3.4 mmol/L — ABNORMAL LOW (ref 3.5–5.1)
Sodium: 140 mmol/L (ref 135–145)

## 2018-09-27 LAB — CBC WITH DIFFERENTIAL/PLATELET
Abs Immature Granulocytes: 0.01 10*3/uL (ref 0.00–0.07)
BASOS ABS: 0 10*3/uL (ref 0.0–0.1)
Basophils Relative: 0 %
Eosinophils Absolute: 0.1 10*3/uL (ref 0.0–0.5)
Eosinophils Relative: 1 %
HCT: 35.1 % — ABNORMAL LOW (ref 36.0–46.0)
Hemoglobin: 11.4 g/dL — ABNORMAL LOW (ref 12.0–15.0)
IMMATURE GRANULOCYTES: 0 %
Lymphocytes Relative: 38 %
Lymphs Abs: 1.7 10*3/uL (ref 0.7–4.0)
MCH: 30.8 pg (ref 26.0–34.0)
MCHC: 32.5 g/dL (ref 30.0–36.0)
MCV: 94.9 fL (ref 80.0–100.0)
Monocytes Absolute: 0.3 10*3/uL (ref 0.1–1.0)
Monocytes Relative: 7 %
Neutro Abs: 2.4 10*3/uL (ref 1.7–7.7)
Neutrophils Relative %: 54 %
Platelets: 228 10*3/uL (ref 150–400)
RBC: 3.7 MIL/uL — ABNORMAL LOW (ref 3.87–5.11)
RDW: 13.2 % (ref 11.5–15.5)
WBC: 4.5 10*3/uL (ref 4.0–10.5)
nRBC: 0 % (ref 0.0–0.2)

## 2018-09-27 LAB — PROTIME-INR
INR: 0.9 (ref 0.8–1.2)
Prothrombin Time: 12 seconds (ref 11.4–15.2)

## 2018-09-27 LAB — APTT: aPTT: 28 seconds (ref 24–36)

## 2018-09-27 NOTE — Patient Instructions (Signed)
  Your procedure is scheduled on: Thursday September 30, 2018 Report to Same Day Surgery 2nd floor [facility] Entrance-take elevator on left to 2nd floor.  Check in with surgery information desk.) To find out your arrival time, call [phone] 1:00-3:00 PM on Wednesday September 29, 2018  Remember: Instructions that are not followed completely may result in serious medical risk, up to and including death, or upon the discretion of your surgeon and anesthesiologist your surgery may need to be rescheduled.    __x__ 1. Do not eat food (including mints, candies, chewing gum) after midnight the night before your procedure. You may drink water up to 2 hours before you are scheduled to arrive at the hospital for your procedure.  Do not drink anything within 2 hours of your scheduled arrival to the hospital.    __x__ 2. No Alcohol for 24 hours before or after surgery.   __x__ 3. No Smoking or e-cigarettes for 24 hours before surgery.  Do not use any chewable tobacco products for at least 6 hours before surgery.   __x__ 4. Notify your doctor if there is any change in your medical condition (cold, fever, infections).   __x__ 5. On the morning of surgery brush your teeth with toothpaste and water.  You may rinse your mouth with mouthwash if you wish.  Do not swallow any toothpaste or mouthwash.  Please read over the following fact sheets that you were given:   [facility] Preparing for Surgery and/or MRSA Information    __x__ Use CHG Soap or Sage wipes as directed on instruction sheet.   Do not wear jewelry, make-up, hairpins, clips or nail polish on the day of surgery.  Do not wear lotions, powders, deodorant, or perfumes.   Do not shave below the face/neck 48 hours prior to surgery.   Do not bring valuables to the hospital.    [facility] is not responsible for any belongings or valuables.               Contacts, dentures or bridgework may not be worn into surgery.  For patients  discharged on the day of surgery, you will NOT be permitted to drive yourself home.  You must have a responsible adult with you for 24 hours after surgery.  __x__ Take these medicines on the morning of surgery with a SMALL SIP OF WATER:  1. None on the morning of surgery  __x__ Use inhalers on the day of surgery and bring them with you to the hospital.  __x__ Follow recommendations from Cardiologist, Pulmonologist or PCP regarding stopping Aspirin, Coumadin, Plavix, Eliquis, Effient, Pradaxa, and Pletal.  __x__ TODAY: Stop Anti-inflammatories such as Advil, Ibuprofen, Motrin, Aleve, Naproxen, Naprosyn, BC/Goodies powders or aspirin products. You may continue to take Tylenol and Celebrex.   __x__ TODAY: Stop supplements until after surgery. You may continue to take Vitamin D, Vitamin B, and multivitamin.

## 2018-09-28 ENCOUNTER — Ambulatory Visit: Payer: BLUE CROSS/BLUE SHIELD

## 2018-09-28 ENCOUNTER — Ambulatory Visit: Payer: BLUE CROSS/BLUE SHIELD | Admitting: Occupational Therapy

## 2018-09-28 ENCOUNTER — Encounter: Payer: Self-pay | Admitting: Occupational Therapy

## 2018-09-28 DIAGNOSIS — M25512 Pain in left shoulder: Secondary | ICD-10-CM | POA: Diagnosis not present

## 2018-09-28 DIAGNOSIS — M6281 Muscle weakness (generalized): Secondary | ICD-10-CM

## 2018-09-28 DIAGNOSIS — R278 Other lack of coordination: Secondary | ICD-10-CM

## 2018-09-28 DIAGNOSIS — M25612 Stiffness of left shoulder, not elsewhere classified: Secondary | ICD-10-CM

## 2018-09-28 DIAGNOSIS — G8929 Other chronic pain: Secondary | ICD-10-CM

## 2018-09-28 LAB — T-HELPER CELL (CD4) - (RCID CLINIC ONLY)
CD4 % Helper T Cell: 45 % (ref 33–55)
CD4 T Cell Abs: 770 /uL (ref 400–2700)

## 2018-09-28 NOTE — Therapy (Signed)
[place] MAIN [facility] SERVICES [address] [place], [place], [address] Phone: [phone]   Fax:  [phone]  Occupational Therapy Treatment  Patient Details  Name: [name] MRN: [id number] Date of Birth: [birth year] No data recorded  Encounter Date: 09/28/2018  OT End of Session - 09/28/18 1041    Visit Number  35    Number of Visits  2    Date for OT Re-Evaluation  12/08/18    Authorization Type  Progress report period starting 09/07/2018    OT Start Time  1017    OT Stop Time  1055    OT Time Calculation (min)  38 min    Activity Tolerance  Patient tolerated treatment well    Behavior During Therapy  [facility] for tasks assessed/performed       Past Medical History:  Diagnosis Date  . Asthma   . Diabetes mellitus without complication ([place]) 3-61-44   hx. NIDDM-dx. 3 weeks ago  . Headache    migraines  . HIV (human immunodeficiency virus infection) ([place])   . Hyperlipidemia    hx  . Seasonal allergies   . Stroke ([place])   . Ulcerative colitis ([place])   . Wears contact lenses   . Wears partial dentures     Past Surgical History:  Procedure Laterality Date  . BUNIONECTOMY     bilateral and toe nails of big toes removed  . CESAREAN SECTION  1997/2006  . CHOLECYSTECTOMY N/A 12/01/2012   Procedure: LAPAROSCOPIC CHOLECYSTECTOMY;  Surgeon: Stark Klein, MD;  Location: [facility];  Service: General;  Laterality: N/A;  . COLONOSCOPY W/ POLYPECTOMY    . EYE SURGERY Bilateral 2013  . MULTIPLE TOOTH EXTRACTIONS    . RADIOLOGY WITH ANESTHESIA Left 09/09/2018   Procedure: MRI WITH ANESTHESIA LEFT SHOULDER WITH CONTRAST;  Surgeon: Radiologist, Medication, MD;  Location: [place];  Service: Radiology;  Laterality: Left;  . TONSILLECTOMY    . TUBAL LIGATION      There were no vitals filed for this visit.  Subjective Assessment - 09/28/18 1035    Subjective   Pt. reports having a preop appointment  yesterday.    Pertinent History  Pt is a 63  y.o. female presenting with LUE weakness and decreased [place] as a result of R basal ganglia CVA on 8/08. Pt was hospitalized for 1 week where she received inpatient therapy and was then discharged home where she received home health therapy for 2 weeks. Prior to CVA, pt was independent for ADLs and IADLs. Currently, pt requires increased assistance, increased time, and/or modified strategies for ADLs and IADLs.     Patient Stated Goals  Pt wants to be able to do everything with her L hand that her R hand can do, including making wigs, picking up laundry baskets, manipulating coins for shopping, and grooming hair.    Currently in Pain?  Yes    Pain Score  3     Pain Location  Shoulder    Pain Orientation  Left    Pain Descriptors / Indicators  Sore    Pain Type  Chronic pain      OT TREATMENT    Therapeutic Exercise:  Pt. worked on formulating a fist with the left hand. Pt. worked on intrinsic stretches to formulate a fist with the left hand. Pt. worked on pinch strengthening in the left hand for lateral, and 3pt. pinch using yellow, red, green clips. Pt. Attempted  blue resistive clips, and was able to  open it slightly. Pt. worked on placing the clips at various vertical and horizontal angles. Tactile and verbal cues were required for eliciting the desired movement.   Neuromuscular re-ed:   Pt. worked on grasping 1/4" small pegs with her left, and placing them onto a pegboard while lining each one up. Pt. required cues for hand placement, and position of the pegs.                        OT Education - 09/28/18 1037    Education Details  Left hand [place] skills    Person(s) Educated  Patient    Methods  Explanation;Verbal cues;Demonstration    Comprehension  Verbalized understanding;Verbal cues required          OT Long Term Goals - 09/15/18 1005      OT LONG TERM GOAL #1   Title  Pt will improve [place] by 10 secs of speed in order to independently manipulate coins  during grocery shopping    Baseline  09/15/2018: Left hand [facility] skills are improving consistently improving. Pt. continues to have difficulty manipulating small objects, and coins.    Time  12    Period  Weeks    Status  On-going    Target Date  12/08/18      OT LONG TERM GOAL #2   Title  Pt. will perform light homemaking tasks with modified independence using energy conservation/work simplification techniques as needed.    Baseline  09/15/2018: Pt. continues to work on improving home management tasks  using energy conservation, and work simplification techniques.    Time  12    Period  Weeks    Status  On-going    Target Date  09/15/18      OT LONG TERM GOAL #4   Title  Pt. will perform hair/wig care with modified indpendence using compensatory strategies.     Baseline  09/15/2018: Pt. continues to  work on hand function skills in order to be able to perform hair/wig care efficiently    Time  12    Period  Weeks    Status  On-going    Target Date  12/08/18      OT LONG TERM GOAL #5   Title  Pt. will manipulate small buttons with modified independence.    Baseline  09/15/2018: Pt continues to have difficulty with buttoning.    Time  12    Period  Weeks    Status  On-going    Target Date  12/08/18            Plan - 09/28/18 1045    Clinical Impression Statement Pt. reports anticipating shoulder surgery on Thursday on an outpatient basis. Pt. is making steady progress with right hand function, and [facility] skills. Pt. continues to work on improving  her ability to formulate a fist, grip strength, and [facility] skills in order to assist with completing ADL, and IADL tasks. Discussed with the pt. that she will need a new order to return to therapy.    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 Pt enjoys singing for leisure however has not been able to do so since  experiencing CVA.    Occupational performance deficits (Please refer to evaluation for details):  ADL's;IADL's;Leisure    Rehab Potential  Good    OT Frequency  2x / week  OT Duration  12 weeks    OT Treatment/Interventions  Self-care/ADL training;Therapeutic exercise;Neuromuscular education;Therapeutic activities;Manual Therapy;Passive range of motion    Consulted and Agree with Plan of Care  Patient       Patient will benefit from skilled therapeutic intervention in order to improve the following deficits and impairments:     Visit Diagnosis: Muscle weakness (generalized)  Other lack of coordination    Problem List Patient Active Problem List   Diagnosis Date Noted  . Postural dizziness 08/04/2018  . Migraine 02/23/2018  . HIV disease ([place])   . Stroke (cerebrum) ([place]) - R basal ganglia d/t small vessel dz 02/18/2018  . HLD (hyperlipidemia) 08/02/2014  . Diabetes mellitus type 2, uncontrolled, with complications ([place]) 94/94/4739  . Ulcerative colitis ([place]) 06/26/2008  . Depression, recurrent ([place]) 04/04/2008    Harrel Carina, MS, OTR/L 09/28/2018, 12:48 PM  [place] MAIN [facility] SERVICES [address] [place], [place], [address] Phone: [phone]   Fax:  [phone]  Name: [name] MRN: [id number] Date of Birth: [birth year]

## 2018-09-28 NOTE — Therapy (Signed)
[place] MAIN [facility] SERVICES [place], [place], [address] Phone: [phone]   Fax:  [phone]  Physical Therapy Treatment/ Physical Therapy Progress Note   Dates of reporting period  08/19/18   to   09/28/18.   Patient Details  Name: [name] MRN: [id number] Date of Birth: [birth year] Referring Provider (PT): Webb Silversmith   Encounter Date: 09/28/2018  PT End of Session - 09/28/18 0947    Visit Number  10    Number of Visits  16    Date for PT Re-Evaluation  10/14/18    Authorization Type  10/20 visits allowed PT    Authorization Time Period  10/10 eval : PN start 09/28/18     PT Start Time  0940    PT Stop Time  1015    PT Time Calculation (min)  35 min    Activity Tolerance  Patient limited by pain;Patient tolerated treatment well    Behavior During Therapy  [facility] for tasks assessed/performed       Past Medical History:  Diagnosis Date  . Asthma   . Diabetes mellitus without complication ([place]) 4-82-50   hx. NIDDM-dx. 3 weeks ago  . Headache    migraines  . HIV (human immunodeficiency virus infection) ([place])   . Hyperlipidemia    hx  . Seasonal allergies   . Stroke ([place])   . Ulcerative colitis ([place])   . Wears contact lenses   . Wears partial dentures     Past Surgical History:  Procedure Laterality Date  . BUNIONECTOMY     bilateral and toe nails of big toes removed  . CESAREAN SECTION  1997/2006  . CHOLECYSTECTOMY N/A 12/01/2012   Procedure: LAPAROSCOPIC CHOLECYSTECTOMY;  Surgeon: Stark Klein, MD;  [facility];  Service: General;  Laterality: N/A;  . COLONOSCOPY W/ POLYPECTOMY    . EYE SURGERY Bilateral 2013  . MULTIPLE TOOTH EXTRACTIONS    . RADIOLOGY WITH ANESTHESIA Left 09/09/2018   Procedure: MRI WITH ANESTHESIA LEFT SHOULDER WITH CONTRAST;  Surgeon: Radiologist, Medication, MD;  Location: [place];  Service: Radiology;  Laterality: Left;  . TONSILLECTOMY    . TUBAL LIGATION      There were  no vitals filed for this visit.  Subjective Assessment - 09/28/18 0950    Subjective  Patient reports she will still be having surgery on 3/19. Is sore today due to not sleeping well. Has been compliant with HEP.     Pertinent History  Patient is a pleasant 63 year old female who presents for L shoulder pain and limited ROM s/p R basal ganglia CVA 8/08. Pt was hospitalized for 1 week where she received inpatient therapy and was then discharged home where she received home health therapy for 2 weeks. PMH includes: ulcerative colitis, stroke, HLD, DM, asthma.  Prior to CVA, pt was independent for ADLs and IADLs. Patient reports she has been having pain in L shoulder since CVA and is unable to lift arm to perform ADL/iADLs such as dressing, doing hair/makeup. Etc. Reports pain is sore/throbbing. Per patient report X ray and nerve testing report were clear    Limitations  Sitting;Lifting;Walking;House hold activities;Writing;Other (comment)    How long can you sit comfortably?  supports L arm with R hand    How long can you stand comfortably?  n/a    How long can you walk comfortably?  limited swing of L arm    Diagnostic tests  X ray, nerve conduction test,  getting MRI on 09/09/18    Patient Stated Goals  reduce pain, improve ROM and strength to be able to reach into counter, do hair and makeup, tie shoes.     Currently in Pain?  Yes    Pain Score  3     Pain Location  Shoulder    Pain Orientation  Left    Pain Descriptors / Indicators  Sore    Pain Type  Chronic pain    Pain Onset  More than a month ago    Pain Frequency  Intermittent           VAS: 5/10  AROM: QuickDash: 71.4%, work 25% Using LUE for dressing. Able to try to hold more objects with LUE.   Therapeutic exercises:  Sci fit:: 2 minutes forward no resistance, 4 minutes backwards no resistance, tactile cueing for body mechanics, increasing angle of flexion and extension with repetition.  Supine AAROM with R hand leading L  10x >90 degrees each time with gradual increase each attempt  Manual therapy: Inferior GH mobilizations 10x 10 second holds Grade I-II for pain relief PA GH mobilizations 10x 10 second holds Grade I-II for pain relief Bicep and pectoral STM x 2 minutes       Patient's condition has the potential to improve in response to therapy. Maximum improvement is yet to be obtained. The anticipated improvement is attainable and reasonable in a generally predictable time.  Patient reports she is able to move her arm more than before and is having less severe pain however continues to be limited in ADLs.      Patient will receive surgery on 3/19. Patient demonstrates progress towards functional goals at this time with decreased maximal pain VAS and improved AAROM. Patient is still unable to perform AROM at this time due to fear of movement. Patient arrived late to session limiting session duration. Will wait for new order for PT shoulder s/p surgery prior to discharging.            PT Education - 09/28/18 0951    Education Details  need for referral s/p surgery, goals,    Person(s) Educated  Patient    Methods  Explanation    Comprehension  Verbalized understanding       PT Short Term Goals - 09/28/18 0953      PT SHORT TERM GOAL #1   Title  Patient will be independent in home exercise program to improve strength/mobility for better functional independence with ADLs.    Baseline  2/6: HEP given  3/17 HEP compliant    Time  2    Period  Weeks    Status  Achieved    Target Date  09/02/18      PT SHORT TERM GOAL #2   Title  Patient will report a worst pain of 8/10 on VAS in L shoulder to improve tolerance with ADLs and reduced symptoms with activities.     Baseline  2/6: 10/10 pain 3/17: 5/10     Time  2    Period  Weeks    Status  Achieved    Target Date  09/02/18        PT Long Term Goals - 09/28/18 0953      PT LONG TERM GOAL #1   Title  Patient will report a worst pain of  4/10 on VAS in L shoulder to improve tolerance with ADLs and reduced symptoms with activities.     Baseline  2/6: 10/10 3/17:  5/10     Time  8    Period  Weeks    Status  Partially Met    Target Date  10/14/18      PT LONG TERM GOAL #2   Title  Patient will improve shoulder AROM to > 115 degrees of flexion  and abduction for improved ability to perform overhead activities.    Baseline  2/6: AROM: flexion 50degrees with elbow bent, 24 with elbow bent (unable to straighten arm) 3/17: AAROM 91 degrees supine,     Time  8    Period  Weeks    Status  Partially Met    Target Date  10/14/18      PT LONG TERM GOAL #3   Title  Patient will decrease Quick DASH score by > 8 points (to <85%) demonstrating reduced self-reported upper extremity disability.    Baseline  2/6: 93.2% 3/17: 71.4%     Time  8    Period  Weeks    Status  Achieved      PT LONG TERM GOAL #4   Title  Patient will demonstrate adequate shoulder ROM and strength (>3+/5) to be able to dress, perform hair care, tie shoes, and don undergarments.     Baseline  2/6: unable to use LUE for any ADLs such as dressing 3/17: able to hold clothing with her LUE at this time, not able to actively move UE     Time  8    Period  Weeks    Status  Partially Met    Target Date  10/14/18            Plan - 09/28/18 1039    Clinical Impression Statement  Patient will receive surgery on 3/19. Patient demonstrates progress towards functional goals at this time with decreased maximal pain VAS and improved AAROM. Patient is still unable to perform AROM at this time due to fear of movement. Patient arrived late to session limiting session duration. Will wait for new order for PT shoulder s/p surgery prior to discharging.     Rehab Potential  Fair    Clinical Impairments Affecting Rehab Potential  (+) motivated, external support system, age/fitness prior to CVA (-) chronicity, severity of pain, no MRI/known etiology    PT Frequency  2x / week     PT Duration  8 weeks    PT Treatment/Interventions  ADLs/Self Care Home Management;Aquatic Therapy;Biofeedback;Cryotherapy;Electrical Stimulation;Iontophoresis 11m/ml Dexamethasone;Moist Heat;Traction;Ultrasound;Functional mobility training;Therapeutic activities;Therapeutic exercise;Patient/family education;Neuromuscular re-education;Manual techniques;Energy conservation;Passive range of motion;Compression bandaging;Splinting;Taping    PT Next Visit Plan  review HEP, STM to musculature, PROM, pain control    PT Home Exercise Plan  see above    Consulted and Agree with Plan of Care  Patient       Patient will benefit from skilled therapeutic intervention in order to improve the following deficits and impairments:  Decreased activity tolerance, Decreased coordination, Decreased endurance, Decreased mobility, Decreased range of motion, Decreased strength, Increased fascial restricitons, Hypomobility, Increased muscle spasms, Impaired perceived functional ability, Impaired flexibility, Impaired UE functional use, Impaired sensation, Improper body mechanics, Postural dysfunction, Pain  Visit Diagnosis: Muscle weakness (generalized)  Other lack of coordination  Chronic left shoulder pain  Stiffness of left shoulder, not elsewhere classified     Problem List Patient Active Problem List   Diagnosis Date Noted  . Postural dizziness 08/04/2018  . Migraine 02/23/2018  . HIV disease ([place]   . Stroke (cerebrum) ([facility]) - R basal ganglia d/t small vessel dz  02/18/2018  . HLD (hyperlipidemia) 08/02/2014  . Diabetes mellitus type 2, uncontrolled, with complications ([place]) 27/09/5007  . Ulcerative colitis ([place]) 06/26/2008  . Depression, recurrent ([place]) 04/04/2008   Janna Arch, PT, DPT   09/28/2018, 10:40 AM  [place] MAIN [facility] SERVICES [address] [place], [place], [address] Phone: [phone]   Fax:  [phone]  Name: [name] MRN:  [id number] Date of Birth: [birth year]

## 2018-09-29 LAB — COMPLETE METABOLIC PANEL WITH GFR
AG Ratio: 1.7 (calc) (ref 1.0–2.5)
ALT: 22 U/L (ref 6–29)
AST: 19 U/L (ref 10–35)
Albumin: 4.2 g/dL (ref 3.6–5.1)
Alkaline phosphatase (APISO): 94 U/L (ref 37–153)
BUN: 11 mg/dL (ref 7–25)
CHLORIDE: 111 mmol/L — AB (ref 98–110)
CO2: 24 mmol/L (ref 20–32)
Calcium: 9.5 mg/dL (ref 8.6–10.4)
Creat: 0.94 mg/dL (ref 0.50–0.99)
GFR, Est African American: 76 mL/min/{1.73_m2} (ref >=60)
GFR, Est Non African American: 65 mL/min/{1.73_m2} (ref >=60)
Globulin: 2.5 g/dL (calc) (ref 1.9–3.7)
Glucose, Bld: 95 mg/dL (ref 65–99)
Potassium: 4.2 mmol/L (ref 3.5–5.3)
Sodium: 143 mmol/L (ref 135–146)
Total Bilirubin: 0.5 mg/dL (ref 0.2–1.2)
Total Protein: 6.7 g/dL (ref 6.1–8.1)

## 2018-09-29 LAB — HIV-1 RNA QUANT-NO REFLEX-BLD
HIV 1 RNA Quant: <20 copies/mL
HIV-1 RNA Quant, Log: <1.3 Log copies/mL

## 2018-09-30 ENCOUNTER — Ambulatory Visit: Payer: BLUE CROSS/BLUE SHIELD

## 2018-09-30 ENCOUNTER — Ambulatory Visit
Admission: RE | Admit: 2018-09-30 | Payer: BLUE CROSS/BLUE SHIELD | Source: Ambulatory Visit | Admitting: Orthopedic Surgery

## 2018-09-30 ENCOUNTER — Encounter: Admission: RE | Payer: Self-pay | Source: Ambulatory Visit

## 2018-09-30 ENCOUNTER — Other Ambulatory Visit: Payer: Self-pay

## 2018-09-30 ENCOUNTER — Other Ambulatory Visit: Payer: BLUE CROSS/BLUE SHIELD

## 2018-09-30 DIAGNOSIS — M25612 Stiffness of left shoulder, not elsewhere classified: Secondary | ICD-10-CM

## 2018-09-30 DIAGNOSIS — M6281 Muscle weakness (generalized): Secondary | ICD-10-CM | POA: Diagnosis not present

## 2018-09-30 DIAGNOSIS — M25512 Pain in left shoulder: Secondary | ICD-10-CM

## 2018-09-30 DIAGNOSIS — G8929 Other chronic pain: Secondary | ICD-10-CM | POA: Diagnosis not present

## 2018-09-30 DIAGNOSIS — R278 Other lack of coordination: Secondary | ICD-10-CM

## 2018-09-30 SURGERY — SHOULDER ARTHROSCOPY WITH DISTAL CLAVICLE RESECTION
Anesthesia: General | Laterality: Left

## 2018-09-30 NOTE — Therapy (Signed)
[place] MAIN [facility] SERVICES [address] [place], [place], [address] Phone: [phone]   Fax:  [phone]  Physical Therapy Treatment  Patient Details  Name: [name] MRN: [id number] Date of Birth: Dec 17, 1955 Referring Provider (PT): Webb Silversmith   Encounter Date: 09/30/2018  PT End of Session - 09/30/18 0859    Visit Number  11    Number of Visits  16    Date for PT Re-Evaluation  10/14/18    Authorization Type  11/20 visits allowed PT    Authorization Time Period  1/10 eval : PN start 09/28/18     PT Start Time  0854    PT Stop Time  0930    PT Time Calculation (min)  36 min    Activity Tolerance  Patient limited by pain;Patient tolerated treatment well    Behavior During Therapy  [facility] for tasks assessed/performed       Past Medical History:  Diagnosis Date  . Asthma   . Diabetes mellitus without complication ([place]) 7-48-27   hx. NIDDM-dx. 3 weeks ago  . Headache    migraines  . HIV (human immunodeficiency virus infection) ([place])   . Hyperlipidemia    hx  . Seasonal allergies   . Stroke ([place])   . Ulcerative colitis ([place])   . Wears contact lenses   . Wears partial dentures     Past Surgical History:  Procedure Laterality Date  . BUNIONECTOMY     bilateral and toe nails of big toes removed  . CESAREAN SECTION  1997/2006  . CHOLECYSTECTOMY N/A 12/01/2012   Procedure: LAPAROSCOPIC CHOLECYSTECTOMY;  Surgeon: Stark Klein, MD;  Location: [facility];  Service: General;  Laterality: N/A;  . COLONOSCOPY W/ POLYPECTOMY    . EYE SURGERY Bilateral 2013  . MULTIPLE TOOTH EXTRACTIONS    . RADIOLOGY WITH ANESTHESIA Left 09/09/2018   Procedure: MRI WITH ANESTHESIA LEFT SHOULDER WITH CONTRAST;  Surgeon: Radiologist, Medication, MD;  Location: [place];  Service: Radiology;  Laterality: Left;  . TONSILLECTOMY    . TUBAL LIGATION      There were no vitals filed for this visit.  Subjective Assessment - 09/30/18 0856    Subjective   Patient reports she didn't sleep last night. Was worried because her surgery was cancelled for now (4-6 week wait). Is having more pain today due to the stress.     Pertinent History  Patient is a pleasant 63 year old female who presents for L shoulder pain and limited ROM s/p R basal ganglia CVA 8/08. Pt was hospitalized for 1 week where she received inpatient therapy and was then discharged home where she received home health therapy for 2 weeks. PMH includes: ulcerative colitis, stroke, HLD, DM, asthma.  Prior to CVA, pt was independent for ADLs and IADLs. Patient reports she has been having pain in L shoulder since CVA and is unable to lift arm to perform ADL/iADLs such as dressing, doing hair/makeup. Etc. Reports pain is sore/throbbing. Per patient report X ray and nerve testing report were clear    Limitations  Sitting;Lifting;Walking;House hold activities;Writing;Other (comment)    How long can you sit comfortably?  supports L arm with R hand    How long can you stand comfortably?  n/a    How long can you walk comfortably?  limited swing of L arm    Diagnostic tests  X ray, nerve conduction test, getting MRI on 09/09/18    Patient Stated Goals  reduce pain, improve  ROM and strength to be able to reach into counter, do hair and makeup, tie shoes.     Currently in Pain?  Yes    Pain Score  6     Pain Location  Shoulder    Pain Orientation  Left    Pain Descriptors / Indicators  Aching;Sore    Pain Type  Chronic pain    Pain Onset  More than a month ago    Pain Frequency  Intermittent            Therapeutic exercises:    Seated AAROM  Flexion with R hand leading L 10x to 90 degrees each time with gradual increase each attempt  Seated AAROM ER with R hand leading the L: 10x ER for anterior musculature stretch, cueing for elbow bent   Seated AAROM PNF pattern 1 with R arm leading the L, 10x, modified due to limited ROM pain free.    Sci fit: sitting on airex pad to decrease angle of  flexion: 2 minutes forward resistance 1.5, 2 minutes backwards 1.5 resistance, tactile cueing for body mechanics, increasing angle of flexion and extension with repetition.   AAROM on UE ranger flexion 10x, abduction 10x, circles clockwise 10x, counterclockwise 10x, placed 1 foot laterally, abduction 10x   Scapular retractions 10x    Manual therapy:  Ice cup massage 5 minutes to L pec and bicep musculature. Pain relief reported after.  Bicep and pectoral STM x 2 minutes   Scapular mobilizations: retractions: 10x 5 second holds, 2 sets. Depression 10x 5 second holds, 3 sets. Pain relief reported by patient with repeated mobilizations                     PT Education - 09/30/18 0858    Education Details  surgery rescheduled. manual, exercise technique     Person(s) Educated  Patient    Methods  Explanation;Demonstration;Tactile cues;Verbal cues    Comprehension  Verbalized understanding;Returned demonstration;Verbal cues required;Tactile cues required;Need further instruction       PT Short Term Goals - 09/28/18 0953      PT SHORT TERM GOAL #1   Title  Patient will be independent in home exercise program to improve strength/mobility for better functional independence with ADLs.    Baseline  2/6: HEP given  3/17 HEP compliant    Time  2    Period  Weeks    Status  Achieved    Target Date  09/02/18      PT SHORT TERM GOAL #2   Title  Patient will report a worst pain of 8/10 on VAS in L shoulder to improve tolerance with ADLs and reduced symptoms with activities.     Baseline  2/6: 10/10 pain 3/17: 5/10     Time  2    Period  Weeks    Status  Achieved    Target Date  09/02/18        PT Long Term Goals - 09/28/18 0953      PT LONG TERM GOAL #1   Title  Patient will report a worst pain of 4/10 on VAS in L shoulder to improve tolerance with ADLs and reduced symptoms with activities.     Baseline  2/6: 10/10 3/17: 5/10     Time  8    Period  Weeks    Status   Partially Met    Target Date  10/14/18      PT LONG TERM GOAL #2  Title  Patient will improve shoulder AROM to > 115 degrees of flexion  and abduction for improved ability to perform overhead activities.    Baseline  2/6: AROM: flexion 50degrees with elbow bent, 24 with elbow bent (unable to straighten arm) 3/17: AAROM 91 degrees supine,     Time  8    Period  Weeks    Status  Partially Met    Target Date  10/14/18      PT LONG TERM GOAL #3   Title  Patient will decrease Quick DASH score by > 8 points (to <85%) demonstrating reduced self-reported upper extremity disability.    Baseline  2/6: 93.2% 3/17: 71.4%     Time  8    Period  Weeks    Status  Achieved      PT LONG TERM GOAL #4   Title  Patient will demonstrate adequate shoulder ROM and strength (>3+/5) to be able to dress, perform hair care, tie shoes, and don undergarments.     Baseline  2/6: unable to use LUE for any ADLs such as dressing 3/17: able to hold clothing with her LUE at this time, not able to actively move UE     Time  8    Period  Weeks    Status  Partially Met    Target Date  10/14/18            Plan - 09/30/18 0914    Clinical Impression Statement  Patient initially presents with increased pain from excessive stress and tension. Patient's pain reduced with ice cup massage allowing for AAROM movement with improving range with decreasing pain. Patient's surgery has been cancelled and potentially will be rescheduled in 4-6 weeks. Overall the patient would benefit from further skilled PT to continue to progress ROM, strength, and tolerance to activity with continued pain education/pain management techniques as well.    Rehab Potential  Fair    Clinical Impairments Affecting Rehab Potential  (+) motivated, external support system, age/fitness prior to CVA (-) chronicity, severity of pain, no MRI/known etiology    PT Frequency  2x / week    PT Duration  8 weeks    PT Treatment/Interventions  ADLs/Self Care  Home Management;Aquatic Therapy;Biofeedback;Cryotherapy;Electrical Stimulation;Iontophoresis 74m/ml Dexamethasone;Moist Heat;Traction;Ultrasound;Functional mobility training;Therapeutic activities;Therapeutic exercise;Patient/family education;Neuromuscular re-education;Manual techniques;Energy conservation;Passive range of motion;Compression bandaging;Splinting;Taping    PT Next Visit Plan  review HEP, STM to musculature, PROM, pain control    PT Home Exercise Plan  see above    Consulted and Agree with Plan of Care  Patient       Patient will benefit from skilled therapeutic intervention in order to improve the following deficits and impairments:  Decreased activity tolerance, Decreased coordination, Decreased endurance, Decreased mobility, Decreased range of motion, Decreased strength, Increased fascial restricitons, Hypomobility, Increased muscle spasms, Impaired perceived functional ability, Impaired flexibility, Impaired UE functional use, Impaired sensation, Improper body mechanics, Postural dysfunction, Pain  Visit Diagnosis: Muscle weakness (generalized)  Other lack of coordination  Chronic left shoulder pain  Stiffness of left shoulder, not elsewhere classified     Problem List Patient Active Problem List   Diagnosis Date Noted  . Postural dizziness 08/04/2018  . Migraine 02/23/2018  . HIV disease ([place]   . Stroke (cerebrum) ([place] - R basal ganglia d/t small vessel dz 02/18/2018  . HLD (hyperlipidemia) 08/02/2014  . Diabetes mellitus type 2, uncontrolled, with complications ([place] 064/15/8309 . Ulcerative colitis ([place] 06/26/2008  . Depression, recurrent ([place] 04/04/2008   MLenda Kelp  Ermalene Postin, PT, DPT   09/30/2018, 9:30 AM  [place] MAIN [facility] SERVICES [address] [place], [place], [address] Phone: [phone]   Fax:  [phone]  Name: Harmonee Tozer MRN: [id number] Date of Birth: May 08, 1956

## 2018-10-01 ENCOUNTER — Ambulatory Visit: Payer: BLUE CROSS/BLUE SHIELD | Admitting: Occupational Therapy

## 2018-10-01 ENCOUNTER — Ambulatory Visit: Payer: BLUE CROSS/BLUE SHIELD

## 2018-10-04 ENCOUNTER — Encounter: Payer: Self-pay | Admitting: Occupational Therapy

## 2018-10-04 NOTE — Therapy (Signed)
[place] MAIN [facility] SERVICES [address] [place], [place], [address] Phone: [phone]   Fax:  [phone]  Patient Details  Name: [name] MRN: [id number] Date of Birth: 1956-04-18 Referring Provider:  No ref. provider found  Encounter Date: 10/04/2018  Attempted to reach the pt. Via telephone during this pandemic with no response.  Harrel Carina, MS, OTR/L 10/04/2018, 1:57 PM  [place] MAIN [facility] SERVICES [address] [place], [place], [address] Phone: [phone]   Fax:  [phone]

## 2018-10-05 ENCOUNTER — Ambulatory Visit: Payer: BLUE CROSS/BLUE SHIELD

## 2018-10-05 ENCOUNTER — Ambulatory Visit: Payer: Self-pay

## 2018-10-05 ENCOUNTER — Encounter: Payer: Self-pay | Admitting: Occupational Therapy

## 2018-10-05 ENCOUNTER — Ambulatory Visit: Payer: BLUE CROSS/BLUE SHIELD | Admitting: Occupational Therapy

## 2018-10-05 NOTE — Therapy (Signed)
[place] MAIN [facility] SERVICES [address] [place], [place], [address] Phone: [phone]   Fax:  [phone]  Patient Details  Name: [name] MRN: [id number] Date of Birth: 25-Jun-1956 Referring Provider:  No ref. provider found  Encounter Date: 10/05/2018  DPT called patient to let patient know the status of the clinic and ensure that we will be reaching out to schedule when we re-open. DPT fielded any questions patient had at this time and offered guidance on current home program as necessary. Patient reported she's compliant with HEP and potentially interested in telehealth/e-visit. Patient had no further questions at this time, and DPT advised that should questions arise patient can reach out to the clinic for guidance via phone or DPT email.  Janna Arch, PT, DPT   10/05/2018, 10:00 AM  [place] MAIN [facility] SERVICES [address] [place], [place], [address] Phone: [phone]   Fax:  [phone]

## 2018-10-07 ENCOUNTER — Ambulatory Visit: Payer: BLUE CROSS/BLUE SHIELD

## 2018-10-08 ENCOUNTER — Ambulatory Visit: Payer: Self-pay

## 2018-10-08 ENCOUNTER — Encounter: Payer: Self-pay | Admitting: Occupational Therapy

## 2018-10-11 ENCOUNTER — Encounter: Payer: Self-pay | Admitting: Infectious Diseases

## 2018-10-12 ENCOUNTER — Encounter: Payer: Self-pay | Admitting: Occupational Therapy

## 2018-10-12 ENCOUNTER — Ambulatory Visit: Payer: Self-pay

## 2018-10-14 ENCOUNTER — Ambulatory Visit (INDEPENDENT_AMBULATORY_CARE_PROVIDER_SITE_OTHER): Payer: BLUE CROSS/BLUE SHIELD | Admitting: Infectious Diseases

## 2018-10-14 ENCOUNTER — Encounter: Payer: Self-pay | Admitting: Occupational Therapy

## 2018-10-14 ENCOUNTER — Other Ambulatory Visit: Payer: Self-pay

## 2018-10-14 DIAGNOSIS — B20 Human immunodeficiency virus [HIV] disease: Secondary | ICD-10-CM

## 2018-10-14 DIAGNOSIS — F339 Major depressive disorder, recurrent, unspecified: Secondary | ICD-10-CM

## 2018-10-14 DIAGNOSIS — Z113 Encounter for screening for infections with a predominantly sexual mode of transmission: Secondary | ICD-10-CM

## 2018-10-14 DIAGNOSIS — Z0000 Encounter for general adult medical examination without abnormal findings: Secondary | ICD-10-CM

## 2018-10-14 MED ORDER — BICTEGRAVIR-EMTRICITAB-TENOFOV 50-200-25 MG PO TABS: 1.0000 | ORAL_TABLET | Freq: Every day | ORAL | 2 refills | Status: DC

## 2018-10-14 NOTE — Patient Instructions (Signed)
It was very nice to talk to you on the phone today.  I am glad to hear that you are staying well and keeping healthy.    Your medication is working perfectly for you and you are doing a great job taking it as prescribed  Please continue your Biktarvy 1 pill once a day as we discussed.  I sent a 90-day refills for you to your pharmacy.  I am hopeful they will fill it in this way.  Vaccines Recommended:   We will give your last hepatitis B and last hepatitis A vaccine at the scheduled visit in March.  You are eligible for your pneumonia booster after August. We will be able to give both this and her flu shot at your October visit.  Recommended Next Office Visit:   Office visit scheduled in 6 months with labs prior to.  Please reference your my chart for your appointment specifics.  Please continue to be well, stay away from folks that are sick, wash her hands frequently, do not touch her face or mouth and continue to take your medications.

## 2018-10-14 NOTE — Assessment & Plan Note (Signed)
Last hep B vaccine and last have a vaccine in May.  Prevnar and flu vaccine will be given in October. She will need a Pap smear-we will update this when she is able after her surgery.

## 2018-10-14 NOTE — Progress Notes (Signed)
Name: [name] GBT:[id number]  DOB: 07-12-1956  PCP: Jearld Fenton, NP   Virtual Visit via Telephone Note  I connected with [name] on 10/14/18 at  9:30 AM EDT by telephone and verified that I am speaking with the correct person using two identifiers.   I discussed the limitations, risks, security and privacy concerns of performing an evaluation and management service by telephone and the availability of in person appointments. I also discussed with the patient that there may be a patient responsible charge related to this service. The patient expressed understanding and agreed to proceed.   Chief Complaint  Patient presents with  . evisit    Shoulder surgery is postponed; needs refills (90 day supplies please); needs connection to [facility]; dental referrals for partials     History of Present Illness: [name] is a 63 y.o. female with HIV infection diagnosed August 2019 on routine screening after having had a stroke. VL 118,000 copies, CD4 500. HIV Risk: heterosexual. OI Hx: none  Previous Regimens:   Biktarvy 02-2018: suppressed   Resistance Testing:   02-2018 Genosure w/o mutations   She is doing well aside from going a little "stir crazy" inside. Having a little depression related to this. She has reached out to her friends and feels happy she can do that.   She is due to have shoulder surgery, however this has been post-poned due to COVID-19 outbreak. She has severe tendinopathy involving the rotator cuff with possible tear as well as significant bursitis. She is doing as best as she can with the pain. "Worst tooth ache ever." Interrupting her sleep.   She has been highly adherent     Medical/surgical/social/family history have been updated during today's visit.    Observations/Objective: [name] sounds to be in good spirits on the phone today.  She is in a good mood and extremely happy about her lab results.  HIV 1 RNA Quant (copies/mL)   Date Value  09/27/2018 <20 NOT DETECTED  06/01/2018 <20 NOT DETECTED  04/01/2018 39 (H)   CD4 T Cell Abs (/uL)  Date Value  09/27/2018 770  02/22/2018 500    Lab Results  Component Value Date   CREATININE 0.94 09/27/2018   CREATININE 0.83 09/27/2018   CREATININE 0.97 09/09/2018    Lab Results  Component Value Date   WBC 4.5 09/27/2018   HGB 11.4 (L) 09/27/2018   HCT 35.1 (L) 09/27/2018   MCV 94.9 09/27/2018   PLT 228 09/27/2018    Lab Results  Component Value Date   ALT 22 09/27/2018   AST 19 09/27/2018   ALKPHOS 83 06/08/2018   BILITOT 0.5 09/27/2018     Assessment and Plan: Problem List Items Addressed This Visit      Unprioritized   Depression, recurrent ([place])    Acute worsening with quarantine and decreased social interactions.  She has been reaching out with her friends and talking to them on the phone and cannot wait to share her good news of her lab results with him today.  We discussed referral to [facility] for some talk therapy.  She is open and excited about this.      HIV disease ([place]) - Primary    [name]'s lab work is perfect.  Her viral load continues to be suppressed for greater than 6 months now.  Her CD4 count is reconstituted to 770 cells.  We reviewed current and previous labs on the phone today and she is extremely happy with her medications.  We discussed that this is a "functional cure" as long as she takes her medications she will be protected from this disease. She can return to clinic in 6 months with labs prior to her follow-up.      Relevant Medications   bictegravir-emtricitabine-tenofovir AF (BIKTARVY) 50-200-25 MG TABS tablet   Other Relevant Orders   HIV-1 RNA quant-no reflex-bld   T-helper cell (CD4)- ([facility] clinic only)   CBC   Comprehensive metabolic panel   Healthcare maintenance    Last hep B vaccine and last have a vaccine in May.  Prevnar and flu vaccine will be given in October. She will need a Pap smear-we will update this  when she is able after her surgery.      Relevant Orders   Hepatitis B surface antibody,qualitative    Other Visit Diagnoses    Routine screening for STI (sexually transmitted infection)       Relevant Orders   RPR       Follow Up Instructions:  I discussed the assessment and treatment plan with the patient. The patient was provided an opportunity to ask questions and all were answered. The patient agreed with the plan and demonstrated an understanding of the instructions.   The patient was advised to call back or seek an in-person evaluation if the symptoms worsen or if the condition fails to improve as anticipated.  I provided 20 minutes of non-face-to-face time during this encounter.   Shannon Madeira, MSN, NP-C [facility] [email or web address] Pager: [phone] Office: [place]: [phone]

## 2018-10-14 NOTE — Assessment & Plan Note (Signed)
Acute worsening with quarantine and decreased social interactions.  She has been reaching out with her friends and talking to them on the phone and cannot wait to share her good news of her lab results with him today.  We discussed referral to [facility] for some talk therapy.  She is open and excited about this.

## 2018-10-14 NOTE — Assessment & Plan Note (Signed)
[name]'s lab work is perfect.  Her viral load continues to be suppressed for greater than 6 months now.  Her CD4 count is reconstituted to 770 cells.  We reviewed current and previous labs on the phone today and she is extremely happy with her medications.  We discussed that this is a "functional cure" as long as she takes her medications she will be protected from this disease. She can return to clinic in 6 months with labs prior to her follow-up.

## 2018-10-15 ENCOUNTER — Encounter: Payer: Self-pay | Admitting: Occupational Therapy

## 2018-10-15 ENCOUNTER — Ambulatory Visit: Payer: Self-pay

## 2018-10-18 ENCOUNTER — Telehealth: Payer: Self-pay | Admitting: Adult Health

## 2018-10-18 ENCOUNTER — Ambulatory Visit: Payer: Self-pay

## 2018-10-18 NOTE — Telephone Encounter (Signed)
Due to current COVID 19 pandemic, our office is severely reducing in office visits for at least the next 2 weeks, in order to minimize the risk to our patients and healthcare providers. Pt understands that although there may be some limitations with this type of visit, we will take all precautions to reduce any security or privacy concerns.  Pt understands that this will be treated like an in office visit and we will file with pt's insurance, and there may be a patient responsible charge related to this service.Best contact number is [phone]

## 2018-10-19 ENCOUNTER — Encounter: Payer: Self-pay | Admitting: Occupational Therapy

## 2018-10-20 ENCOUNTER — Encounter: Payer: Self-pay | Admitting: Occupational Therapy

## 2018-10-22 ENCOUNTER — Encounter: Payer: Self-pay | Admitting: Occupational Therapy

## 2018-10-22 NOTE — Therapy (Signed)
[place] MAIN [facility] SERVICES [address] [place], [place], [address] Phone: [phone]   Fax:  [phone]  Patient Details  Name: [name] MRN: [id number] Date of Birth: 1956-01-30 Referring Provider:  No ref. provider found  Encounter Date: 10/22/2018   The [facility] outpatient clinics are closed at this time due to the COVID-19 epidemic. The patient was contacted in regards to their therapy services. The patient is in agreement that they are safe and consent to being on hold for therapy services until the [facility] outpatient facilities reopen. At which time, the patient will be contacted to schedule an appointment to resume therapy services.     Harrel Carina, MS, OTR/L 10/22/2018, 2:40 PM  [place] MAIN [facility] SERVICES [address] [place], [place], [address] Phone: [phone]   Fax:  [phone]

## 2018-10-25 NOTE — Therapy (Signed)
[place] MAIN [facility] SERVICES [address] [place], [place], [address] Phone: [phone]   Fax:  [phone]  Patient Details  Name: [name] MRN: [id number] Date of Birth: Jan 26, 1956 Referring Provider:  No ref. provider found  Encounter Date: 10/25/2018   The [facility] outpatient clinics are still closed at this time due to the COVID-19 epidemic. The patient was contacted in regards to their therapy services. The pt currently declines telehealth physical therapy services and also states that her internet connection at home can be inconsistent. The patient is in agreement that she is safe and consents to being on hold for therapy services until the [facility] outpatient facilities reopen. At which time, the patient will be contacted to schedule an appointment to resume therapy services.      Phillips Grout PT, DPT, GCS  [name] 10/25/2018, 3:53 PM  [place] MAIN [facility] SERVICES [address] [place], [place], [address] Phone: [phone]   Fax:  [phone]

## 2018-10-26 ENCOUNTER — Encounter: Payer: Self-pay | Admitting: Occupational Therapy

## 2018-10-26 ENCOUNTER — Ambulatory Visit: Payer: Self-pay

## 2018-10-26 NOTE — Progress Notes (Signed)
[facility] [address] [place]. [place] [address] [phone]     Virtual Visit via Telephone Note  I connected with [name] on 10/26/18 at  9:45 AM EDT by telephone located remotely within my own home and verified that I am speaking with the correct person using two identifiers who reports being located within her own home.    I discussed the limitations, risks, security and privacy concerns of performing an evaluation and management service by telephone and the availability of in person appointments. I also discussed with the patient that there may be a patient responsible charge related to this service. The patient expressed understanding and agreed to proceed.   History of Present Illness:  [name] is a 63 y.o. female who has been followed in this office for right BG infarct in 02/2018. She was initially scheduled for face-to-face office visit today at this time for follow up visit but due to [place], face-to-face office visit rescheduled for non-face-to-face telephone visit.  She has been stable from a stroke standpoint with residual deficits of left hemiparesis without much improvement. Therapy on hold at this time due to [place]. Speech "okay at times" but feels as though it could be better. During conversation, frequent hesitant speech and pausing.  She continues to have left shoulder pain and was scheduled to undergo shoulder arthroscopy with distal clavicle excision but this was placed on hold due to COVID-19 safety precautions.  She does endorse difficulty with therapy and making progress due to shoulder pain.  She endorses difficulty performing daily functions or activities due to residual left arm weakness and limited range of motion due to pain.  She is interested in restarting therapy after procedure.  She has continued on aspirin 81 mg without side effects of bleeding or bruising.  Continues on atorvastatin without side effects myalgias.   She does not routinely monitor BP at home.  Glucose levels have been stable recent A1c 6.0.  She endorses difficulty      Observations/Objective:  General: Pleasant middle-age female asking and answering questions appropriately  MR SHOULDER LEFT W CONTRAST 09/09/18 IMPRESSION: Moderately severe to severe appearing rotator cuff tendinopathy without tear. Moderately severe acromioclavicular osteoarthritis. Subacromial spurring also noted. Fluid in the subacromial/subdeltoid bursa consistent with bursitis. Findings compatible with adhesive capsulitis. Mild to moderate glenohumeral osteoarthritis.   Assessment and Plan:  [name] is a 63 y.o. year old female here with right BG infarct on 02/18/2018 secondary to small vessel disease. Vascular risk factors include HLD and DM.  She has been stable from a stroke standpoint with residual LUE weakness and occasional speech deficits.     -Continue aspirin 81 mg daily  and Lipitor 40 mg for secondary stroke prevention -F/u with PCP regarding your HLD and DM management -continue to monitor BP at home along with glucose levels -Follow up with orthopedics in regards to rescheduling surgery -Restart therapy after procedure but during the interval time continued exercises at home -Maintain strict control of hypertension with blood pressure goal below 130/90, diabetes with hemoglobin A1c goal below 6.5% and cholesterol with LDL cholesterol (bad cholesterol) goal below 70 mg/dL. I also advised the patient to eat a healthy diet with plenty of whole grains, cereals, fruits and vegetables, exercise regularly and maintain ideal body weight.   Follow Up Instructions:   Follow-up in 6 months or call earlier if needed    I discussed the assessment and treatment plan with the patient.  The patient was provided an opportunity  to ask questions and all were answered to their satisfaction. The patient agreed with the plan and verbalized an understanding  of the instructions.   I provided 33 minutes of non-face-to-face time during this encounter.    Venancio Poisson, AGNP-BC  [facility] [address] [place], [place] [address]  Phone [phone] Fax [phone] Note: This document was prepared with digital dictation and possible smart phrase technology. Any transcriptional errors that result from this process are unintentional.

## 2018-10-27 ENCOUNTER — Other Ambulatory Visit: Payer: Self-pay

## 2018-10-27 ENCOUNTER — Ambulatory Visit (INDEPENDENT_AMBULATORY_CARE_PROVIDER_SITE_OTHER): Payer: BLUE CROSS/BLUE SHIELD | Admitting: Adult Health

## 2018-10-27 ENCOUNTER — Encounter: Payer: Self-pay | Admitting: Adult Health

## 2018-10-27 DIAGNOSIS — I10 Essential (primary) hypertension: Secondary | ICD-10-CM | POA: Diagnosis not present

## 2018-10-27 DIAGNOSIS — E785 Hyperlipidemia, unspecified: Secondary | ICD-10-CM

## 2018-10-27 DIAGNOSIS — I63511 Cerebral infarction due to unspecified occlusion or stenosis of right middle cerebral artery: Secondary | ICD-10-CM | POA: Diagnosis not present

## 2018-10-27 DIAGNOSIS — I69354 Hemiplegia and hemiparesis following cerebral infarction affecting left non-dominant side: Secondary | ICD-10-CM

## 2018-10-27 DIAGNOSIS — E119 Type 2 diabetes mellitus without complications: Secondary | ICD-10-CM | POA: Diagnosis not present

## 2018-10-27 NOTE — Progress Notes (Signed)
I agree with the above plan 

## 2018-10-28 ENCOUNTER — Encounter: Payer: Self-pay | Admitting: Occupational Therapy

## 2018-10-28 ENCOUNTER — Ambulatory Visit: Payer: Self-pay

## 2018-11-01 ENCOUNTER — Other Ambulatory Visit: Payer: Self-pay | Admitting: Internal Medicine

## 2018-11-02 ENCOUNTER — Encounter: Payer: Self-pay | Admitting: Occupational Therapy

## 2018-11-02 ENCOUNTER — Ambulatory Visit: Payer: Self-pay

## 2018-11-04 ENCOUNTER — Ambulatory Visit: Payer: Self-pay

## 2018-11-04 ENCOUNTER — Encounter: Payer: Self-pay | Admitting: Occupational Therapy

## 2018-11-09 ENCOUNTER — Ambulatory Visit: Payer: Self-pay

## 2018-11-09 ENCOUNTER — Encounter: Payer: Self-pay | Admitting: Occupational Therapy

## 2018-11-11 ENCOUNTER — Ambulatory Visit: Payer: Self-pay

## 2018-11-11 ENCOUNTER — Encounter: Payer: Self-pay | Admitting: Occupational Therapy

## 2018-11-18 ENCOUNTER — Ambulatory Visit: Payer: BLUE CROSS/BLUE SHIELD | Attending: Internal Medicine | Admitting: Physical Therapy

## 2018-11-18 ENCOUNTER — Encounter: Payer: Self-pay | Admitting: Physical Therapy

## 2018-11-18 ENCOUNTER — Other Ambulatory Visit: Payer: Self-pay

## 2018-11-18 DIAGNOSIS — M25512 Pain in left shoulder: Secondary | ICD-10-CM | POA: Diagnosis not present

## 2018-11-18 DIAGNOSIS — G8929 Other chronic pain: Secondary | ICD-10-CM | POA: Insufficient documentation

## 2018-11-18 DIAGNOSIS — M6281 Muscle weakness (generalized): Secondary | ICD-10-CM

## 2018-11-18 NOTE — Therapy (Signed)
[facility] [address] [place], [address] Phone: [phone]   Fax:  [phone]  Physical Therapy Treatment  Patient Details  Name: [name] MRN: [id number] Date of Birth: November 10, 1955 Referring Provider (PT): Webb Silversmith   Encounter Date: 11/18/2018  PT End of Session - 11/18/18 1323    Visit Number  1    Number of Visits  17    Date for PT Re-Evaluation  01/11/19    Authorization Type  13/20 visits allowed PT    Authorization Time Period  1/10 eval    PT Start Time  1130    PT Stop Time  1220    PT Time Calculation (min)  50 min    Activity Tolerance  Patient limited by pain;Patient tolerated treatment well    Behavior During Therapy  [facility] for tasks assessed/performed       Past Medical History:  Diagnosis Date  . Asthma   . Diabetes mellitus without complication ([place]) 07-14-73   hx. NIDDM-dx. 3 weeks ago  . Headache    migraines  . HIV (human immunodeficiency virus infection) ([place])   . Hyperlipidemia    hx  . Seasonal allergies   . Stroke ([place])   . Ulcerative colitis ([place])   . Wears contact lenses   . Wears partial dentures     Past Surgical History:  Procedure Laterality Date  . BUNIONECTOMY     bilateral and toe nails of big toes removed  . CESAREAN SECTION  1997/2006  . CHOLECYSTECTOMY N/A 12/01/2012   Procedure: LAPAROSCOPIC CHOLECYSTECTOMY;  Surgeon: Stark Klein, MD;  Location: [facility];  Service: General;  Laterality: N/A;  . COLONOSCOPY W/ POLYPECTOMY    . EYE SURGERY Bilateral 2013  . MULTIPLE TOOTH EXTRACTIONS    . RADIOLOGY WITH ANESTHESIA Left 09/09/2018   Procedure: MRI WITH ANESTHESIA LEFT SHOULDER WITH CONTRAST;  Surgeon: Radiologist, Medication, MD;  Location: [facility];  Service: Radiology;  Laterality: Left;  . TONSILLECTOMY    . TUBAL LIGATION      There were no vitals filed for this visit.  Subjective Assessment - 11/18/18 1136    Subjective  Patient reports  she is having lightly less pain in the L shoulder. Patient reports her shoulder surgery has been cancelled at this time. Patient reports the worst pain she has had in her L shoulder has been a 6/10 d/t increased muscle tension in the ant upper arm, forearm and chest. Patient reports increased pain with attempting to lift plates and household tasks. Reports compliance with HEP with no questions or concerns.     Pertinent History  Patient is a pleasant 63 year old female who presents for L shoulder pain and limited ROM s/p R basal ganglia CVA 8/08. Pt was hospitalized for 1 week where she received inpatient therapy and was then discharged home where she received home health therapy for 2 weeks. PMH includes: ulcerative colitis, stroke, HLD, DM, asthma.  Prior to CVA, pt was independent for ADLs and IADLs. Patient reports she has been having pain in L shoulder since CVA and is unable to lift arm to perform ADL/iADLs such as dressing, doing hair/makeup. Etc. Reports pain is sore/throbbing. Per patient report X ray and nerve testing report were clear    Limitations  Sitting;Lifting;Walking;House hold activities;Writing;Other (comment)    How long can you sit comfortably?  supports L arm with R hand    How long can you stand comfortably?  n/a  How long can you walk comfortably?  limited swing of L arm    Diagnostic tests  X ray, nerve conduction test, getting MRI on 09/09/18    Patient Stated Goals  reduce pain, improve ROM and strength to be able to reach into counter, do hair and makeup, tie shoes.     Currently in Pain?  Yes    Pain Score  6     Pain Location  Shoulder    Pain Orientation  Left    Pain Descriptors / Indicators  Aching;Spasm;Sore    Pain Type  Chronic pain    Pain Onset  More than a month ago    Aggravating Factors   reching, lifting, bathing, dressing    Pain Relieving Factors  moist heat, shower    Effect of Pain on Daily Activities  limits ADLs increased time needed with some pain         OBJECTIVE  MUSCULOSKELETAL: Tremor: Normal Bulk: Normal Tone: Normal  Cervical Screen AROM: WFL and painless with overpressure in all planes  Elbow/Wrist Screen Elbow AROM: WNL Wrist AROM: WNL  Palpation No pain with palpation to anterior, posterior, lateral, and superior shoulder  Strength R/L 5/2- Shoulder flexion (anterior deltoid/pec major/coracobrachialis, axillary n. (C5-6) and musculocutaneous n. (C5-7)) 5/2- Shoulder abduction (deltoid/supraspinatus, axillary/suprascapular n, C5) 5/2- Shoulder external rotation (infraspinatus/teres minor) 5/2 Shoulder internal rotation (subcapularis/lats/pec major) 5/2+ Shoulder extension (posterior deltoid, lats, teres major, axillary/thoracodorsal n.) 4/4 Elbow flexion (biceps brachii, brachialis, brachioradialis, musculoskeletal n, C5-6) 5/4 Elbow extension (triceps, radial n, C7) 5/4- Wrist Extension 5/4- Wrist Flexion 5/4 with pain Finger adduction (interossei, ulnar n, T1)  AROM R/L 180/42d with elbow flex Shoulder flexion 180/63d with elbow flex Shoulder abduction 90/back of L ear Shoulder external rotation 70/L greater trochanter Shoulder internal rotation 60/60 Shoulder extension *Indicates pain, overpressure performed unless otherwise indicated  PROM R/L 180/97 Shoulder flexion 180/105 Shoulder abduction 90/10 Shoulder external rotation 70/40 Shoulder internal rotation 60/60 Shoulder extension *Indicates pain, overpressure performed unless otherwise indicated  Accessory Motions/Glides Glenohumeral: Posterior: R: normal L: Empty (pain limited) Inferior: Normal bilat  Muscle Length Testing Pectoralis Major: R: normal L: shortened/spastic Pectoralis Minor: R normal L: shortened/spastic Biceps: R: normal L: shortened/spastic  NEUROLOGICAL:  Mental Status Patient is oriented to person, place and time.  Recent memory is intact.  Remote memory is intact.  Attention span and concentration are  intact.  Expressive speech is intact.  Patient's fund of knowledge is within normal limits for educational level.  Cranial Nerves Visual acuity and visual fields are intact  Extraocular muscles are intact  Facial sensation is intact bilaterally  Facial strength is intact bilaterally  Hearing is normal as tested by gross conversation Palate elevates midline, normal phonation  Shoulder shrug strength is intact  Tongue protrudes midline   Sensation Grossly intact to light touch bilateral UE as determined by testing dermatomes C2-T2; though patient reports diminished LUE compared to RUE at all dermatomes Proprioception and hot/cold testing deferred on this date     Manual Multiple bouts of shoulder PROM in all planes, inc ROM as able with sustained holds in tolerated positions.  STM with attempted trigger point release to bicep, UT, and pec minor. Increasing pressure as able with education on spasticity and desensitization provided.   Ther-Ex HEP review. Addition of supine pec stretch with elbow ext. Patient is unable to attain position initially. PT positioned RUE in pec stretch with elbow ext and encouraged patient to continue to attempt to achieve this position  on LUE with deep breathing to aid in relaxation  Neuro Re-Ed Education on sensitization and guarding. Pain science education provided to patient on plasticity and purpose of desensitization on pain perception and experience. Patient very open to education and reports she has been guarding her arm and has not had her UE touched since PT ceased.                         PT Education - 11/18/18 1142    Education Details  POC update, goal update, HEP update    Person(s) Educated  Patient    Methods  Explanation;Demonstration;Verbal cues    Comprehension  Verbalized understanding;Returned demonstration       PT Short Term Goals - 11/18/18 1145      PT SHORT TERM GOAL #1   Title  Patient will be  independent in home exercise program to improve strength/mobility for better functional independence with ADLs.    Baseline  2/6: HEP given  3/17 HEP compliant    Time  2    Period  Weeks    Status  Achieved    Target Date  09/02/18      PT SHORT TERM GOAL #2   Title  Patient will report a worst pain of 8/10 on VAS in L shoulder to improve tolerance with ADLs and reduced symptoms with activities.     Baseline  11/18/18 6/10    Time  2    Period  Weeks    Status  Achieved    Target Date  09/02/18        PT Long Term Goals - 11/18/18 1145      PT LONG TERM GOAL #1   Title  Patient will report a worst pain of 4/10 on VAS in L shoulder to improve tolerance with ADLs and reduced symptoms with activities.     Baseline  11/18/18 6/10     Time  8    Period  Weeks    Status  On-going      PT LONG TERM GOAL #2   Title  Patient will improve shoulder AROM to > 115 degrees of flexion  and abduction for improved ability to perform overhead activities.    Baseline  25/7 AROM: flexion 42d with slight elbow bent, ABD: 63d with elbow bent, unable with elbow straight    Time  8    Period  Weeks    Status  Revised      PT LONG TERM GOAL #3   Title  Patient will decrease Quick DASH score by > 8 points (to <85%) demonstrating reduced self-reported upper extremity disability.    Baseline  5/7    Time  8    Period  Weeks    Status  Achieved      PT LONG TERM GOAL #4   Title  Patient will demonstrate adequate shoulder ROM and strength (>3+/5) to be able to dress, perform hair care, tie shoes, and don undergarments.     Baseline  5/7 unable to assess d/t lack of full ROM    Time  8    Period  Weeks    Status  Revised            Plan - 11/18/18 1433    Clinical Impression Statement  Patient with minor decrease in AROM/PROM since stopping physical therapy d/t COVID-19, suspected d/t gaurding and decreased touch/manual techniques to UE causing increased sensitivity. Patient is eventually able  to respond well to manual techniques for increased ROM. patient will continue to benefit from skilled PT to address impairments to return to PLOF and improve QOL    Rehab Potential  Fair    Clinical Impairments Affecting Rehab Potential  (+) motivated, external support system, age/fitness prior to CVA (-) chronicity, severity of pain, no MRI/known etiology    PT Frequency  2x / week    PT Duration  8 weeks    PT Treatment/Interventions  ADLs/Self Care Home Management;Aquatic Therapy;Biofeedback;Cryotherapy;Electrical Stimulation;Iontophoresis 80m/ml Dexamethasone;Moist Heat;Traction;Ultrasound;Functional mobility training;Therapeutic activities;Therapeutic exercise;Patient/family education;Neuromuscular re-education;Manual techniques;Energy conservation;Passive range of motion;Compression bandaging;Splinting;Taping    PT Next Visit Plan  review HEP, STM to musculature, PROM, pain control    PT Home Exercise Plan  see above    Consulted and Agree with Plan of Care  Patient       Patient will benefit from skilled therapeutic intervention in order to improve the following deficits and impairments:  Decreased activity tolerance, Decreased coordination, Decreased endurance, Decreased mobility, Decreased range of motion, Decreased strength, Increased fascial restricitons, Hypomobility, Increased muscle spasms, Impaired perceived functional ability, Impaired flexibility, Impaired UE functional use, Impaired sensation, Improper body mechanics, Postural dysfunction, Pain  Visit Diagnosis: Muscle weakness (generalized)     Problem List Patient Active Problem List   Diagnosis Date Noted  . Postural dizziness 08/04/2018  . Healthcare maintenance 04/01/2018  . Migraine 02/23/2018  . HIV disease ([place]   . Stroke (cerebrum) ([place] - R basal ganglia d/t small vessel dz 02/18/2018  . HLD (hyperlipidemia) 08/02/2014  . Diabetes mellitus type 2, uncontrolled, with complications ([facility] [place] 010/30/1314 .  Ulcerative colitis ([place] 06/26/2008  . Depression, recurrent ([place] 04/04/2008   CShelton SilvasPT, DPT CShelton Silvas5/01/2019, 3:33 PM  [facility] [address] [place] [address]Phone: [phone]  Fax:  [phone] Name: Shannon SheahanMRN: [id number] of Birth: [birth year]

## 2018-11-23 ENCOUNTER — Ambulatory Visit: Payer: BLUE CROSS/BLUE SHIELD | Admitting: Physical Therapy

## 2018-11-23 ENCOUNTER — Encounter: Payer: Self-pay | Admitting: Physical Therapy

## 2018-11-23 ENCOUNTER — Other Ambulatory Visit: Payer: Self-pay

## 2018-11-23 DIAGNOSIS — M25512 Pain in left shoulder: Secondary | ICD-10-CM | POA: Diagnosis not present

## 2018-11-23 DIAGNOSIS — M6281 Muscle weakness (generalized): Secondary | ICD-10-CM

## 2018-11-23 DIAGNOSIS — G8929 Other chronic pain: Secondary | ICD-10-CM | POA: Diagnosis not present

## 2018-11-23 NOTE — Therapy (Signed)
[facility] [address] [place], [address] Phone: [phone]   Fax:  [phone]  Physical Therapy Treatment  Patient Details  Name: [name] MRN: [id number] Date of Birth: April 13, 1956 Referring Provider (PT): Webb Silversmith   Encounter Date: 62/06/2019  PT End of Session - 62/12/20 1300    Visit Number  2    Number of Visits  17    Date for PT Re-Evaluation  01/11/19    Authorization Type  13/20 visits allowed PT    Authorization Time Period  1/10 eval    PT Start Time  1130    PT Stop Time  1214    PT Time Calculation (min)  44 min    Activity Tolerance  Patient tolerated treatment well    Behavior During Therapy  [facility] for tasks assessed/performed       Past Medical History:  Diagnosis Date  . Asthma   . Diabetes mellitus without complication ([place]) 62-30-07   hx. NIDDM-dx. 3 weeks ago  . Headache    migraines  . HIV (human immunodeficiency virus infection) ([place])   . Hyperlipidemia    hx  . Seasonal allergies   . Stroke ([place])   . Ulcerative colitis ([place])   . Wears contact lenses   . Wears partial dentures     Past Surgical History:  Procedure Laterality Date  . BUNIONECTOMY     bilateral and toe nails of big toes removed  . CESAREAN SECTION  1997/2006  . CHOLECYSTECTOMY N/A 12/01/2012   Procedure: LAPAROSCOPIC CHOLECYSTECTOMY;  Surgeon: Stark Klein, MD;  Location: [facility];  Service: General;  Laterality: N/A;  . COLONOSCOPY W/ POLYPECTOMY    . EYE SURGERY Bilateral 2013  . MULTIPLE TOOTH EXTRACTIONS    . RADIOLOGY WITH ANESTHESIA Left 662/27/2020   Procedure: MRI WITH ANESTHESIA LEFT SHOULDER WITH CONTRAST;  Surgeon: Radiologist, Medication, MD;  Location: [facility];  Service: Radiology;  Laterality: Left;  . TONSILLECTOMY    . TUBAL LIGATION      There were no vitals filed for this visit.  Subjective Assessment - 62/12/20 1135    Subjective  Patient reports she is having surgery  debridement of L shoulder 62/19/20. Reports she has been working on moving her L shoulder, but is still having difficulty. Reports 4/10 pain today.     Pertinent History  Patient is a pleasant 63 year old female who presents for L shoulder pain and limited ROM s/p R basal ganglia CVA 62/08. Pt was hospitalized for 1 week where she received inpatient therapy and was then discharged home where she received home health therapy for 2 weeks. PMH includes: ulcerative colitis, stroke, HLD, DM, asthma.  Prior to CVA, pt was independent for ADLs and IADLs. Patient reports she has been having pain in L shoulder since CVA and is unable to lift arm to perform ADL/iADLs such as dressing, doing hair/makeup. Etc. Reports pain is sore/throbbing. Per patient report X ray and nerve testing report were clear    Limitations  Sitting;Lifting;Walking;House hold activities;Writing;Other (comment)    How long can you sit comfortably?  supports L arm with R hand    How long can you stand comfortably?  n/a    How long can you walk comfortably?  limited swing of L arm    Diagnostic tests  X ray, nerve conduction test, getting MRI on 662/27/20    Patient Stated Goals  reduce pain, improve ROM and strength to be  able to reach into counter, do hair and makeup, tie shoes.     Pain Onset  More than a month ago         Abd 94d Flex 110d IR 40d (at 60d abd) ER 15d (at 60d abd)   Manual STM withtrigger point releaseTo L pec minor/major, latissimus/teres minor, UT, ant and middle deltoid PROM into all planes 19mn in flex, abd, IR, and ER inc ROM as able (focus on ER) GHJ AP grade II 30sec bouts 6bouts, increased to G III for ROM 8 bouts, and with ER for 8 additional bouts, Increased ROM throughout manual techniques and decreased pain noted from patient    Ther-Ex - Pulleys AAROM flex x10 abd x10 with 3sec hold at max availible range with VC and TC for proper posture and technique - UE ranger flex and abd with PT supporting  elbow in ext 5sec holds in each position, patient stepping forward to increase range - UE ranger counterclockwise and clockwise x20 each direction (2x 10 each way with breaks between) with PT supporting elbow in full ext at 90d shoulder flexion with PT cuing to increase motion as able    Abd 94d Flex 125d IR 55d (at 60d abd) ER 25d (at 60d abd)               PT Education - 62/12/20 1258    Education Details  therex form     Person(s) Educated  Patient    Methods  Explanation;Demonstration;Tactile cues;Verbal cues    Comprehension  Verbalized understanding;Returned demonstration;Tactile cues required;Verbal cues required       PT Short Term Goals - 62/07/20 1145      PT SHORT TERM GOAL #1   Title  Patient will be independent in home exercise program to improve strength/mobility for better functional independence with ADLs.    Baseline  62/6: HEP given  62/17 HEP compliant    Time  2    Period  Weeks    Status  Achieved    Target Date  62/20/20      PT SHORT TERM GOAL #2   Title  Patient will report a worst pain of 8/10 on VAS in L shoulder to improve tolerance with ADLs and reduced symptoms with activities.     Baseline  62/7/20 6/10    Time  2    Period  Weeks    Status  Achieved    Target Date  62/20/20        PT Long Term Goals - 62/07/20 1145      PT LONG TERM GOAL #1   Title  Patient will report a worst pain of 4/10 on VAS in L shoulder to improve tolerance with ADLs and reduced symptoms with activities.     Baseline  62/7/20 6/10     Time  8    Period  Weeks    Status  On-going      PT LONG TERM GOAL #2   Title  Patient will improve shoulder AROM to > 115 degrees of flexion  and abduction for improved ability to perform overhead activities.    Baseline  25/7 AROM: flexion 42d with slight elbow bent, ABD: 63d with elbow bent, unable with elbow straight    Time  8    Period  Weeks    Status  Revised      PT LONG TERM GOAL #3   Title  Patient will  decrease Quick DASH score by > 8 points (  to <85%) demonstrating reduced self-reported upper extremity disability.    Baseline  5/7    Time  8    Period  Weeks    Status  Achieved      PT LONG TERM GOAL #4   Title  Patient will demonstrate adequate shoulder ROM and strength (>3+/5) to be able to dress, perform hair care, tie shoes, and don undergarments.     Baseline  5/7 unable to assess d/t lack of full ROM    Time  8    Period  Weeks    Status  Revised            Plan - 62/12/20 1311    Clinical Impression Statement  Patient with decreased sensitivity and increased PROM at the beginning of rthis session compared to last seesion, with noted increase in motion in all planes following session. Patient is able to complete AAROM therex with PT assistance for UE support with good progress this session as well. patietn is encouraged by ROM increase at the end of session. Patient will continue to benefit from skilled PT to continue to address these impairments to restore funtion of LUE.     Rehab Potential  Fair    Clinical Impairments Affecting Rehab Potential  (+) motivated, external support system, age/fitness prior to CVA (-) chronicity, severity of pain, no MRI/known etiology    PT Frequency  2x / week    PT Duration  8 weeks    PT Treatment/Interventions  ADLs/Self Care Home Management;Aquatic Therapy;Biofeedback;Cryotherapy;Electrical Stimulation;Iontophoresis 65m/ml Dexamethasone;Moist Heat;Traction;Ultrasound;Functional mobility training;Therapeutic activities;Therapeutic exercise;Patient/family education;Neuromuscular re-education;Manual techniques;Energy conservation;Passive range of motion;Compression bandaging;Splinting;Taping    PT Next Visit Plan  review HEP, STM to musculature, PROM, pain control    PT Home Exercise Plan  see above    Consulted and Agree with Plan of Care  Patient       Patient will benefit from skilled therapeutic intervention in order to improve the  following deficits and impairments:  Decreased activity tolerance, Decreased coordination, Decreased endurance, Decreased mobility, Decreased range of motion, Decreased strength, Increased fascial restricitons, Hypomobility, Increased muscle spasms, Impaired perceived functional ability, Impaired flexibility, Impaired UE functional use, Impaired sensation, Improper body mechanics, Postural dysfunction, Pain  Visit Diagnosis: Muscle weakness (generalized)     Problem List Patient Active Problem List   Diagnosis Date Noted  . Postural dizziness 08/04/2018  . Healthcare maintenance 04/01/2018  . Migraine 02/23/2018  . HIV disease ([place]   . Stroke (cerebrum) ([facility] - R basal ganglia d/t small vessel dz 02/18/2018  . HLD (hyperlipidemia) 08/02/2014  . Diabetes mellitus type 2, uncontrolled, with complications ([place] 083/38/2505 . Ulcerative colitis ([place] 06/26/2008  . Depression, recurrent ([place] 04/04/2008   CShelton SilvasPT, DPT CShelton Silvas62/06/2019, 1:13 PM  [place] [address] [place] [address]Phone: [phone]  Fax:  [phone] Name: MMakinsley SchiaviMRN: [id number] of Birth: 703-19-1957

## 2018-11-24 ENCOUNTER — Other Ambulatory Visit: Payer: Self-pay | Admitting: Orthopedic Surgery

## 2018-11-25 ENCOUNTER — Ambulatory Visit: Payer: BLUE CROSS/BLUE SHIELD | Admitting: Physical Therapy

## 2018-11-25 ENCOUNTER — Encounter: Payer: Self-pay | Admitting: *Deleted

## 2018-11-25 ENCOUNTER — Encounter: Payer: Self-pay | Admitting: Physical Therapy

## 2018-11-25 ENCOUNTER — Other Ambulatory Visit: Payer: Self-pay

## 2018-11-25 DIAGNOSIS — M6281 Muscle weakness (generalized): Secondary | ICD-10-CM | POA: Diagnosis not present

## 2018-11-25 DIAGNOSIS — M25512 Pain in left shoulder: Secondary | ICD-10-CM | POA: Diagnosis not present

## 2018-11-25 DIAGNOSIS — G8929 Other chronic pain: Secondary | ICD-10-CM | POA: Diagnosis not present

## 2018-11-25 NOTE — Therapy (Signed)
[place] [address] [place], [address] Phone: [phone]   Fax:  [phone]  Physical Therapy Treatment  Patient Details  Name: [name] MRN: [id number] Date of Birth: 09/07/1955 Referring Provider (PT): Webb Silversmith   Encounter Date: 11/25/2018    Past Medical History:  Diagnosis Date  . Asthma   . Diabetes mellitus without complication ([place]) 9-79-89   hx. NIDDM-dx. 3 weeks ago  . Headache    migraines  . HIV (human immunodeficiency virus infection) ([place])   . Hyperlipidemia    hx  . Seasonal allergies   . Stroke ([place])   . Ulcerative colitis ([place])   . Wears contact lenses   . Wears partial dentures     Past Surgical History:  Procedure Laterality Date  . BUNIONECTOMY     bilateral and toe nails of big toes removed  . CESAREAN SECTION  1997/2006  . CHOLECYSTECTOMY N/A 12/01/2012   Procedure: LAPAROSCOPIC CHOLECYSTECTOMY;  Surgeon: Stark Klein, MD;  [facility];  Service: General;  Laterality: N/A;  . COLONOSCOPY W/ POLYPECTOMY    . EYE SURGERY Bilateral 2013  . MULTIPLE TOOTH EXTRACTIONS    . RADIOLOGY WITH ANESTHESIA Left 09/09/2018   Procedure: MRI WITH ANESTHESIA LEFT SHOULDER WITH CONTRAST;  Surgeon: Radiologist, Medication, MD;  Location: [facility];  Service: Radiology;  Laterality: Left;  . TONSILLECTOMY    . TUBAL LIGATION      There were no vitals filed for this visit.  Subjective Assessment - 11/25/18 1131    Subjective  Patient reports she has been diligently moving her shoulder, and her pain is 4/10 to date.     Pertinent History  Patient is a pleasant 63 year old female who presents for L shoulder pain and limited ROM s/p R basal ganglia CVA 8/08. Pt was hospitalized for 1 week where she received inpatient therapy and was then discharged home where she received home health therapy for 2 weeks. PMH includes: ulcerative colitis, stroke, HLD, DM, asthma.  Prior to  CVA, pt was independent for ADLs and IADLs. Patient reports she has been having pain in L shoulder since CVA and is unable to lift arm to perform ADL/iADLs such as dressing, doing hair/makeup. Etc. Reports pain is sore/throbbing. Per patient report X ray and nerve testing report were clear    Limitations  Sitting;Lifting;Walking;House hold activities;Writing;Other (comment)    How long can you sit comfortably?  supports L arm with R hand    How long can you stand comfortably?  n/a    How long can you walk comfortably?  limited swing of L arm    Diagnostic tests  X ray, nerve conduction test, getting MRI on 09/09/18    Patient Stated Goals  reduce pain, improve ROM and strength to be able to reach into counter, do hair and makeup, tie shoes.     Pain Onset  More than a month ago      PROM at beginning of session Abd 94d Flex 116d IR 52d (at 60d abd) ER 14d (at 60d abd)   Manual STM withtrigger point releaseTo L pec minor/major, latissimus/teres minor,UT, bicep (chief pain complaint),  ant and middle deltoid Following: Dry Needling: (2) 75m .3 needles placed along the L Biceps to decrease increased muscular spasms and trigger points with the patient positioned in supine. Patient was educated on risks and benefits of therapy and verbally consents to PT.  PROM into all planes765m in flex,  abd, IR, and ER inc ROM as able(focus on ER) GHJAPgrade II 30sec bouts 6bouts, increased to G III for ROM 8 bouts, and with ER for 8 additional bouts, Increased ROM throughout manual techniques and decreased pain noted from patient    Ther-Ex - AAROM towel slides in sitting flex x10 5 sec hold - AAROM towel slides in sitting abd x10 5 sec hold with cuing for posture to keep torso from rotating - Seated ER AAROM with cane with towel between elbow and rib cage 15x 5sec hold with TC to maintain proper posture - Sidelying sleeper AAROM with opposite hand pushing into IR 15x 5sec with good carry over  following set up cuing  All AAROM in pain free/ mild pain range  PROM end of session Abd 112d Flex 119d IR 62d (at 60d abd) ER 36d (at 60d abd)                       PT Education - 11/25/18 1152    Education Details  TDN education, therex form    Person(s) Educated  Patient    Methods  Explanation;Demonstration;Verbal cues    Comprehension  Verbalized understanding;Returned demonstration;Verbal cues required       PT Short Term Goals - 11/18/18 1145      PT SHORT TERM GOAL #1   Title  Patient will be independent in home exercise program to improve strength/mobility for better functional independence with ADLs.    Baseline  2/6: HEP given  3/17 HEP compliant    Time  2    Period  Weeks    Status  Achieved    Target Date  09/02/18      PT SHORT TERM GOAL #2   Title  Patient will report a worst pain of 8/10 on VAS in L shoulder to improve tolerance with ADLs and reduced symptoms with activities.     Baseline  11/18/18 6/10    Time  2    Period  Weeks    Status  Achieved    Target Date  09/02/18        PT Long Term Goals - 11/18/18 1145      PT LONG TERM GOAL #1   Title  Patient will report a worst pain of 4/10 on VAS in L shoulder to improve tolerance with ADLs and reduced symptoms with activities.     Baseline  11/18/18 6/10     Time  8    Period  Weeks    Status  On-going      PT LONG TERM GOAL #2   Title  Patient will improve shoulder AROM to > 115 degrees of flexion  and abduction for improved ability to perform overhead activities.    Baseline  25/7 AROM: flexion 42d with slight elbow bent, ABD: 63d with elbow bent, unable with elbow straight    Time  8    Period  Weeks    Status  Revised      PT LONG TERM GOAL #3   Title  Patient will decrease Quick DASH score by > 8 points (to <85%) demonstrating reduced self-reported upper extremity disability.    Baseline  5/7    Time  8    Period  Weeks    Status  Achieved      PT LONG TERM GOAL #4    Title  Patient will demonstrate adequate shoulder ROM and strength (>3+/5) to be able to dress, perform hair care,  tie shoes, and don undergarments.     Baseline  5/7 unable to assess d/t lack of full ROM    Time  8    Period  Weeks    Status  Revised            Plan - 11/25/18 1329    Clinical Impression Statement  Patient continues to increase ROM following session with manual and AAROM techniques. Patient is hopeful that debridement 5/19 will aid in progress. Patient with increased tension and spasm in L bicep todayl, agreed to TDN with good localized twitch repsonse with good muscle lengthening following. Encouraged patient against maintaining gaurding posture to prevent continued tension/pain in this area.     Rehab Potential  Fair    Clinical Impairments Affecting Rehab Potential  (+) motivated, external support system, age/fitness prior to CVA (-) chronicity, severity of pain, no MRI/known etiology    PT Frequency  2x / week    PT Duration  8 weeks    PT Treatment/Interventions  ADLs/Self Care Home Management;Aquatic Therapy;Biofeedback;Cryotherapy;Electrical Stimulation;Iontophoresis 7m/ml Dexamethasone;Moist Heat;Traction;Ultrasound;Functional mobility training;Therapeutic activities;Therapeutic exercise;Patient/family education;Neuromuscular re-education;Manual techniques;Energy conservation;Passive range of motion;Compression bandaging;Splinting;Taping    PT Next Visit Plan  review HEP, STM to musculature, PROM, pain control    PT Home Exercise Plan  see above    Consulted and Agree with Plan of Care  Patient       Patient will benefit from skilled therapeutic intervention in order to improve the following deficits and impairments:  Decreased activity tolerance, Decreased coordination, Decreased endurance, Decreased mobility, Decreased range of motion, Decreased strength, Increased fascial restricitons, Hypomobility, Increased muscle spasms, Impaired perceived functional  ability, Impaired flexibility, Impaired UE functional use, Impaired sensation, Improper body mechanics, Postural dysfunction, Pain(trigger point dry needling)  Visit Diagnosis: Chronic left shoulder pain  Muscle weakness (generalized)     Problem List Patient Active Problem List   Diagnosis Date Noted  . Postural dizziness 08/04/2018  . Healthcare maintenance 04/01/2018  . Migraine 02/23/2018  . HIV disease ([facility] [facility]   . Stroke (cerebrum) ([place] - R basal ganglia d/t small vessel dz 02/18/2018  . HLD (hyperlipidemia) 08/02/2014  . Diabetes mellitus type 2, uncontrolled, with complications ([place] 007/86/7544 . Ulcerative colitis ([place] 06/26/2008  . Depression, recurrent ([place] 04/04/2008   CShelton SilvasPT, DPT CShelton Silvas5/14/2020, 1:32 PM  [facility] [address] [place] [address]Phone: [phone]  Fax:  [phone] Name: MGypsy KelloggMRN: [id number] of Birth: 725-Aug-1957

## 2018-11-26 ENCOUNTER — Other Ambulatory Visit
Admission: RE | Admit: 2018-11-26 | Discharge: 2018-11-26 | Disposition: A | Discharge status: Home / Self Care | Payer: BLUE CROSS/BLUE SHIELD | Source: Ambulatory Visit | Attending: Orthopedic Surgery | Admitting: Orthopedic Surgery

## 2018-11-26 DIAGNOSIS — Z1159 Encounter for screening for other viral diseases: Secondary | ICD-10-CM | POA: Diagnosis not present

## 2018-11-28 LAB — NOVEL CORONAVIRUS, NAA (HOSP ORDER, SEND-OUT TO REF LAB; TAT 18-24 HRS): SARS-CoV-2, NAA: NOT DETECTED

## 2018-11-29 ENCOUNTER — Telehealth: Payer: Self-pay | Admitting: Internal Medicine

## 2018-11-29 NOTE — Anesthesia Preprocedure Evaluation (Addendum)
Anesthesia Evaluation  Patient identified by MRN, date of birth, ID band Patient awake    Reviewed: Allergy & Precautions, NPO status , Patient's Chart, lab work & pertinent test results  History of Anesthesia Complications Negative for: history of anesthetic complications  Airway Mallampati: IV   Neck ROM: Full    Dental  (+)    Pulmonary asthma ,    Pulmonary exam normal breath sounds clear to auscultation       Cardiovascular Exercise Tolerance: Good negative cardio ROS Normal cardiovascular exam Rhythm:Regular Rate:Normal     Neuro/Psych PSYCHIATRIC DISORDERS Depression CVA (02/18/18 with residual LUE weakness and speech deficits)    GI/Hepatic Ulcerative colitis   Endo/Other  diabetes, Type 2  Renal/GU negative Renal ROS     Musculoskeletal  (+) Arthritis ,   Abdominal   Peds  Hematology  (+) HIV,   Anesthesia Other Findings   Reproductive/Obstetrics                            Anesthesia Physical Anesthesia Plan  ASA: III  Anesthesia Plan: General and Regional   Post-op Pain Management:  Regional for Post-op pain and GA combined w/ Regional for post-op pain   Induction: Intravenous  PONV Risk Score and Plan: 3 and Dexamethasone and Ondansetron  Airway Management Planned: LMA  Additional Equipment:   Intra-op Plan:   Post-operative Plan: Extubation in OR  Informed Consent: I have reviewed the patients History and Physical, chart, labs and discussed the procedure including the risks, benefits and alternatives for the proposed anesthesia with the patient or authorized representative who has indicated his/her understanding and acceptance.       Plan Discussed with: CRNA  Anesthesia Plan Comments:        Anesthesia Quick Evaluation

## 2018-11-29 NOTE — Telephone Encounter (Signed)
Done, given back to [name]

## 2018-11-29 NOTE — Discharge Instructions (Signed)
General Anesthesia, Adult, Care After  This sheet gives you information about how to care for yourself after your procedure. Your health care provider may also give you more specific instructions. If you have problems or questions, contact your health care provider.  What can I expect after the procedure?  After the procedure, the following side effects are common:  Pain or discomfort at the IV site.  Nausea.  Vomiting.  Sore throat.  Trouble concentrating.  Feeling cold or chills.  Weak or tired.  Sleepiness and fatigue.  Soreness and body aches. These side effects can affect parts of the body that were not involved in surgery.  Follow these instructions at home:    For at least 24 hours after the procedure:  Have a responsible adult stay with you. It is important to have someone help care for you until you are awake and alert.  Rest as needed.  Do not:  Participate in activities in which you could fall or become injured.  Drive.  Use heavy machinery.  Drink alcohol.  Take sleeping pills or medicines that cause drowsiness.  Make important decisions or sign legal documents.  Take care of children on your own.  Eating and drinking  Follow any instructions from your health care provider about eating or drinking restrictions.  When you feel hungry, start by eating small amounts of foods that are soft and easy to digest (bland), such as toast. Gradually return to your regular diet.  Drink enough fluid to keep your urine pale yellow.  If you vomit, rehydrate by drinking water, juice, or clear broth.  General instructions  If you have sleep apnea, surgery and certain medicines can increase your risk for breathing problems. Follow instructions from your health care provider about wearing your sleep device:  Anytime you are sleeping, including during daytime naps.  While taking prescription pain medicines, sleeping medicines, or medicines that make you drowsy.  Return to your normal activities as told by your health care  provider. Ask your health care provider what activities are safe for you.  Take over-the-counter and prescription medicines only as told by your health care provider.  If you smoke, do not smoke without supervision.  Keep all follow-up visits as told by your health care provider. This is important.  Contact a health care provider if:  You have nausea or vomiting that does not get better with medicine.  You cannot eat or drink without vomiting.  You have pain that does not get better with medicine.  You are unable to pass urine.  You develop a skin rash.  You have a fever.  You have redness around your IV site that gets worse.  Get help right away if:  You have difficulty breathing.  You have chest pain.  You have blood in your urine or stool, or you vomit blood.  Summary  After the procedure, it is common to have a sore throat or nausea. It is also common to feel tired.  Have a responsible adult stay with you for the first 24 hours after general anesthesia. It is important to have someone help care for you until you are awake and alert.  When you feel hungry, start by eating small amounts of foods that are soft and easy to digest (bland), such as toast. Gradually return to your regular diet.  Drink enough fluid to keep your urine pale yellow.  Return to your normal activities as told by your health care provider. Ask your health care   provider what activities are safe for you.  This information is not intended to replace advice given to you by your health care provider. Make sure you discuss any questions you have with your health care provider.  Document Released: 10/06/2000 Document Revised: 02/13/2017 Document Reviewed: 02/13/2017  Elsevier Interactive Patient Education  2019 [employer]

## 2018-11-29 NOTE — Telephone Encounter (Signed)
[employer] faxed paperwork needing to be completed  Please complete section 4 questions 2-3-4  I highlighted area that needs to be completed In regina's in box

## 2018-11-30 ENCOUNTER — Ambulatory Visit: Payer: BLUE CROSS/BLUE SHIELD | Admitting: Anesthesiology

## 2018-11-30 ENCOUNTER — Ambulatory Visit: Payer: BLUE CROSS/BLUE SHIELD | Admitting: Occupational Therapy

## 2018-11-30 ENCOUNTER — Encounter
Admission: RE | Disposition: A | Discharge status: Other Acute Inpatient Hospital | Payer: Self-pay | Source: Home / Self Care | Attending: Orthopedic Surgery

## 2018-11-30 ENCOUNTER — Encounter: Payer: BLUE CROSS/BLUE SHIELD | Admitting: Physical Therapy

## 2018-11-30 ENCOUNTER — Ambulatory Visit: Payer: BLUE CROSS/BLUE SHIELD

## 2018-11-30 ENCOUNTER — Ambulatory Visit
Admission: RE | Admit: 2018-11-30 | Discharge: 2018-11-30 | Disposition: A | Discharge status: Other Acute Inpatient Hospital | Payer: BLUE CROSS/BLUE SHIELD | Attending: Orthopedic Surgery | Admitting: Orthopedic Surgery

## 2018-11-30 DIAGNOSIS — S43432A Superior glenoid labrum lesion of left shoulder, initial encounter: Secondary | ICD-10-CM | POA: Insufficient documentation

## 2018-11-30 DIAGNOSIS — M7542 Impingement syndrome of left shoulder: Secondary | ICD-10-CM | POA: Diagnosis not present

## 2018-11-30 DIAGNOSIS — J45909 Unspecified asthma, uncomplicated: Secondary | ICD-10-CM | POA: Insufficient documentation

## 2018-11-30 DIAGNOSIS — I69354 Hemiplegia and hemiparesis following cerebral infarction affecting left non-dominant side: Secondary | ICD-10-CM | POA: Insufficient documentation

## 2018-11-30 DIAGNOSIS — Z79899 Other long term (current) drug therapy: Secondary | ICD-10-CM | POA: Insufficient documentation

## 2018-11-30 DIAGNOSIS — E119 Type 2 diabetes mellitus without complications: Secondary | ICD-10-CM | POA: Insufficient documentation

## 2018-11-30 DIAGNOSIS — Z880 Allergy status to penicillin: Secondary | ICD-10-CM | POA: Diagnosis not present

## 2018-11-30 DIAGNOSIS — Z8249 Family history of ischemic heart disease and other diseases of the circulatory system: Secondary | ICD-10-CM | POA: Diagnosis not present

## 2018-11-30 DIAGNOSIS — E785 Hyperlipidemia, unspecified: Secondary | ICD-10-CM | POA: Insufficient documentation

## 2018-11-30 DIAGNOSIS — Z21 Asymptomatic human immunodeficiency virus [HIV] infection status: Secondary | ICD-10-CM | POA: Insufficient documentation

## 2018-11-30 DIAGNOSIS — Z881 Allergy status to other antibiotic agents status: Secondary | ICD-10-CM | POA: Insufficient documentation

## 2018-11-30 DIAGNOSIS — M19049 Primary osteoarthritis, unspecified hand: Secondary | ICD-10-CM | POA: Insufficient documentation

## 2018-11-30 DIAGNOSIS — X58XXXA Exposure to other specified factors, initial encounter: Secondary | ICD-10-CM | POA: Diagnosis not present

## 2018-11-30 DIAGNOSIS — M19012 Primary osteoarthritis, left shoulder: Secondary | ICD-10-CM | POA: Insufficient documentation

## 2018-11-30 DIAGNOSIS — Z7982 Long term (current) use of aspirin: Secondary | ICD-10-CM | POA: Diagnosis not present

## 2018-11-30 DIAGNOSIS — M25512 Pain in left shoulder: Secondary | ICD-10-CM | POA: Diagnosis not present

## 2018-11-30 DIAGNOSIS — G8918 Other acute postprocedural pain: Secondary | ICD-10-CM | POA: Diagnosis not present

## 2018-11-30 DIAGNOSIS — Z7984 Long term (current) use of oral hypoglycemic drugs: Secondary | ICD-10-CM | POA: Insufficient documentation

## 2018-11-30 DIAGNOSIS — M7552 Bursitis of left shoulder: Secondary | ICD-10-CM | POA: Insufficient documentation

## 2018-11-30 HISTORY — DX: Unspecified osteoarthritis, unspecified site: M19.90

## 2018-11-30 HISTORY — PX: SHOULDER ARTHROSCOPY WITH ROTATOR CUFF REPAIR AND SUBACROMIAL DECOMPRESSION: SHX5686

## 2018-11-30 LAB — GLUCOSE, CAPILLARY
Glucose-Capillary: 83 mg/dL (ref 70–99)
Glucose-Capillary: 123 mg/dL — ABNORMAL HIGH (ref 70–99)

## 2018-11-30 SURGERY — SHOULDER ARTHROSCOPY WITH ROTATOR CUFF REPAIR AND SUBACROMIAL DECOMPRESSION
Anesthesia: Regional | Site: Shoulder | Laterality: Left

## 2018-11-30 MED ORDER — CHLORHEXIDINE GLUCONATE CLOTH 2 % EX PADS: 6.0000 | MEDICATED_PAD | Freq: Once | CUTANEOUS | Status: DC

## 2018-11-30 MED ORDER — FENTANYL CITRATE (PF) 100 MCG/2ML IJ SOLN
INTRAMUSCULAR | Status: DC | PRN
  Administered 2018-11-30: 50 ug via INTRAVENOUS
  Administered 2018-11-30 (×2): 25 ug via INTRAVENOUS
  Administered 2018-11-30: 50 ug via INTRAVENOUS

## 2018-11-30 MED ORDER — OXYCODONE HCL 5 MG PO TABS: 5.0000 mg | ORAL_TABLET | Freq: Once | ORAL | Status: DC | PRN

## 2018-11-30 MED ORDER — ONDANSETRON HCL 4 MG/2ML IJ SOLN
INTRAMUSCULAR | Status: DC | PRN
  Administered 2018-11-30: 4 mg via INTRAVENOUS

## 2018-11-30 MED ORDER — MIDAZOLAM HCL 2 MG/2ML IJ SOLN
INTRAMUSCULAR | Status: DC | PRN
  Administered 2018-11-30: 1 mg via INTRAVENOUS

## 2018-11-30 MED ORDER — SUCCINYLCHOLINE CHLORIDE 20 MG/ML IJ SOLN
INTRAMUSCULAR | Status: DC | PRN
  Administered 2018-11-30: 100 mg via INTRAVENOUS

## 2018-11-30 MED ORDER — EPHEDRINE SULFATE 50 MG/ML IJ SOLN
INTRAMUSCULAR | Status: DC | PRN
  Administered 2018-11-30: 10 mg via INTRAVENOUS
  Administered 2018-11-30: 5 mg via INTRAVENOUS

## 2018-11-30 MED ORDER — LIDOCAINE HCL (CARDIAC) PF 100 MG/5ML IV SOSY
PREFILLED_SYRINGE | INTRAVENOUS | Status: DC | PRN
  Administered 2018-11-30: 40 mg via INTRAVENOUS

## 2018-11-30 MED ORDER — EPINEPHRINE PF 1 MG/ML IJ SOLN
INTRAMUSCULAR | Status: DC | PRN
  Administered 2018-11-30: 10 mL

## 2018-11-30 MED ORDER — ACETAMINOPHEN 10 MG/ML IV SOLN: 1000.0000 mg | Freq: Once | INTRAVENOUS | Status: DC | PRN

## 2018-11-30 MED ORDER — OXYCODONE HCL 5 MG PO TABS: 5.0000 mg | ORAL_TABLET | ORAL | 0 refills | Status: DC | PRN

## 2018-11-30 MED ORDER — LACTATED RINGERS IV SOLN
INTRAVENOUS | Status: DC
  Administered 2018-11-30: 12:00:00 via INTRAVENOUS

## 2018-11-30 MED ORDER — ONDANSETRON HCL 4 MG PO TABS: 4.0000 mg | ORAL_TABLET | Freq: Three times a day (TID) | ORAL | 0 refills | Status: DC | PRN

## 2018-11-30 MED ORDER — DEXAMETHASONE SODIUM PHOSPHATE 4 MG/ML IJ SOLN
INTRAMUSCULAR | Status: DC | PRN
  Administered 2018-11-30: 4 mg via INTRAVENOUS

## 2018-11-30 MED ORDER — LACTATED RINGERS IV SOLN: INTRAVENOUS | Status: DC

## 2018-11-30 MED ORDER — CLINDAMYCIN PHOSPHATE 600 MG/50ML IV SOLN
600.0000 mg | INTRAVENOUS | Status: AC
  Administered 2018-11-30: 13:00:00 600 mg via INTRAVENOUS

## 2018-11-30 MED ORDER — FENTANYL CITRATE (PF) 100 MCG/2ML IJ SOLN: 25.0000 ug | INTRAMUSCULAR | Status: DC | PRN

## 2018-11-30 MED ORDER — ONDANSETRON HCL 4 MG/2ML IJ SOLN: 4.0000 mg | Freq: Once | INTRAMUSCULAR | Status: DC | PRN

## 2018-11-30 MED ORDER — ROPIVACAINE HCL 5 MG/ML IJ SOLN
INTRAMUSCULAR | Status: DC | PRN
  Administered 2018-11-30: 30 mL

## 2018-11-30 MED ORDER — LIDOCAINE HCL (PF) 1 % IJ SOLN
INTRAMUSCULAR | Status: DC | PRN
  Administered 2018-11-30: 11 mL

## 2018-11-30 MED ORDER — OXYCODONE HCL 5 MG/5ML PO SOLN: 5.0000 mg | Freq: Once | ORAL | Status: DC | PRN

## 2018-11-30 MED ORDER — PROPOFOL 10 MG/ML IV BOLUS
INTRAVENOUS | Status: DC | PRN
  Administered 2018-11-30: 150 mg via INTRAVENOUS

## 2018-11-30 SURGICAL SUPPLY — 42 items
ADAPTER IRRIG TUBE 2 SPIKE SOL (ADAPTER) ×4 IMPLANT
ADPR TBG 2 SPK PMP STRL ASCP (ADAPTER) ×2
BLADE SURG 15 STRL LF DISP TIS (BLADE) IMPLANT
BLADE SURG 15 STRL SS (BLADE) ×2
BUR RADIUS 4.0X18.5 (BURR) ×2 IMPLANT
BUR RADIUS 5.5 (BURR) ×2 IMPLANT
CANNULA 5.75X7 CRYSTAL CLEAR (CANNULA) ×2 IMPLANT
DRAPE INCISE IOBAN 66X45 STRL (DRAPES) ×1 IMPLANT
DRAPE SHEET LG 3/4 BI-LAMINATE (DRAPES) ×4 IMPLANT
DURAPREP 26ML APPLICATOR (WOUND CARE) ×6 IMPLANT
ELECT REM PT RETURN 9FT ADLT (ELECTROSURGICAL) ×2
ELECTRODE REM PT RTRN 9FT ADLT (ELECTROSURGICAL) ×1 IMPLANT
GAUZE SPONGE 4X4 12PLY STRL (GAUZE/BANDAGES/DRESSINGS) ×2 IMPLANT
GAUZE XEROFORM 1X8 LF (GAUZE/BANDAGES/DRESSINGS) ×2 IMPLANT
GLOVE BIO SURGEON STRL SZ 6.5 (GLOVE) ×1 IMPLANT
GLOVE BIO SURGEON STRL SZ7.5 (GLOVE) ×1 IMPLANT
GLOVE BIOGEL PI IND STRL 6.5 (GLOVE) IMPLANT
GLOVE BIOGEL PI IND STRL 8 (GLOVE) IMPLANT
GLOVE BIOGEL PI INDICATOR 6.5 (GLOVE) ×1
GLOVE BIOGEL PI INDICATOR 8 (GLOVE) ×1
GLOVE SURG 9.0 ORTHO LTXF (GLOVE) ×4 IMPLANT
GOWN STRL REUS TWL 2XL XL LVL4 (GOWN DISPOSABLE) ×2 IMPLANT
GOWN STRL REUS W/ TWL LRG LVL3 (GOWN DISPOSABLE) ×1 IMPLANT
GOWN STRL REUS W/TWL LRG LVL3 (GOWN DISPOSABLE) ×4
IV LACTATED RINGER IRRG 3000ML (IV SOLUTION) ×20
IV LR IRRIG 3000ML ARTHROMATIC (IV SOLUTION) ×4 IMPLANT
KIT STABILIZATION SHOULDER (MISCELLANEOUS) ×2 IMPLANT
MASK FACE SPIDER DISP (MASK) ×2 IMPLANT
NEPTUNE MANIFOLD (MISCELLANEOUS) ×2 IMPLANT
PACK ARTHROSCOPY SHOULDER (MISCELLANEOUS) ×2 IMPLANT
SET TUBE SUCT SHAVER OUTFL 24K (TUBING) ×2 IMPLANT
SET TUBE TIP INTRA-ARTICULAR (MISCELLANEOUS) ×2 IMPLANT
SLING ARM M TX990204 (SOFTGOODS) ×1 IMPLANT
STRIP CLOSURE SKIN 1/2X4 (GAUZE/BANDAGES/DRESSINGS) ×2 IMPLANT
SUT ETHILON 4-0 (SUTURE) ×2
SUT ETHILON 4-0 FS2 18XMFL BLK (SUTURE) ×1
SUT PDSII 0 (SUTURE) ×1 IMPLANT
SUTURE ETHLN 4-0 FS2 18XMF BLK (SUTURE) IMPLANT
SYR 10ML LL (SYRINGE) ×2 IMPLANT
TAPE MICROFOAM 4IN (TAPE) ×2 IMPLANT
TUBING ARTHRO INFLOW-ONLY STRL (TUBING) ×2 IMPLANT
WAND HAND CNTRL MULTIVAC 90 (MISCELLANEOUS) ×1 IMPLANT

## 2018-11-30 NOTE — H&P (Signed)
PREOPERATIVE H&P  Chief Complaint: M75.42 Impingement Syndrome left shoulder  HPI: [name] is a 63 y.o. female who presents for preoperative history and physical with a diagnosis of M75.42 Impingement Syndrome left shoulder with acromioclavicular joint arthrosis and possible rotator cuff tear. Symptoms of pain, weakness and decreased ROM are significantly impairing activities of daily living.  Patient has had a previous stroke which has caused left sided weakness at baseline.  She has agreed with surgical management.   Past Medical History:  Diagnosis Date  . Arthritis    fingers  . Asthma   . Diabetes mellitus without complication ([place]) 2-83-15   hx. NIDDM-dx. 3 weeks ago  . Headache    migraines - none since stroke  . HIV (human immunodeficiency virus infection) ([place])   . Hyperlipidemia    hx  . Seasonal allergies   . Stroke ([place]) 02/18/2018   Residual Left side weakness and speech issues  . Ulcerative colitis ([place])   . Wears contact lenses   . Wears partial dentures    upper   Past Surgical History:  Procedure Laterality Date  . BUNIONECTOMY     bilateral and toe nails of big toes removed  . CESAREAN SECTION  1997/2006  . CHOLECYSTECTOMY N/A 12/01/2012   Procedure: LAPAROSCOPIC CHOLECYSTECTOMY;  Surgeon: Stark Klein, MD;  Location: [facility];  Service: General;  Laterality: N/A;  . COLONOSCOPY W/ POLYPECTOMY    . EYE SURGERY Bilateral 2013  . MULTIPLE TOOTH EXTRACTIONS    . RADIOLOGY WITH ANESTHESIA Left 09/09/2018   Procedure: MRI WITH ANESTHESIA LEFT SHOULDER WITH CONTRAST;  Surgeon: Radiologist, Medication, MD;  Location: [facility];  Service: Radiology;  Laterality: Left;  . TONSILLECTOMY    . TUBAL LIGATION     Social History   Socioeconomic History  . Marital status: Legally Separated    Spouse name: Not on file  . Number of children: Not on file  . Years of education: Not on file  . Highest education level: Not on file  Occupational History  . Not on file   Social Needs  . Financial resource strain: Not on file  . Food insecurity:    Worry: Not on file    Inability: Not on file  . Transportation needs:    Medical: Not on file    Non-medical: Not on file  Tobacco Use  . Smoking status: Never Smoker  . Smokeless tobacco: Never Used  Substance and Sexual Activity  . Alcohol use: Not Currently    Alcohol/week: 0.0 standard drinks  . Drug use: No  . Sexual activity: Not Currently    Partners: Male    Comment: offered condoms  Lifestyle  . Physical activity:    Days per week: Not on file    Minutes per session: Not on file  . Stress: Not on file  Relationships  . Social connections:    Talks on phone: Not on file    Gets together: Not on file    Attends religious service: Not on file    Active member of club or organization: Not on file    Attends meetings of clubs or organizations: Not on file    Relationship status: Not on file  Other Topics Concern  . Not on file  Social History Narrative  . Not on file   Family History  Problem Relation Age of Onset  . Hypertension Mother   . Diabetes Mother   . Cancer Father        lung  ca  . Seizures Daughter        109yr   Allergies  Allergen Reactions  . Azithromycin Itching    Muscle aches  . Erythromycin Nausea And Vomiting  . Penicillins Nausea And Vomiting    Did it involve swelling of the face/tongue/throat, SOB, or low BP? No Did it involve sudden or severe rash/hives, skin peeling, or any reaction on the inside of your mouth or nose? No Did you need to seek medical attention at a hospital or doctor's office? No When did it last happen?childhood allergy If all above answers are "NO", may proceed with cephalosporin use.   . Tetracycline Nausea And Vomiting   Prior to Admission medications   Medication Sig Start Date End Date Taking? Authorizing Provider  aspirin EC 81 MG EC tablet Take 1 tablet (81 mg total) by mouth daily. 02/24/18  Yes BDonzetta Starch NP   atorvastatin (LIPITOR) 40 MG tablet TAKE 1 TABLET DAILY AT 6 P.M. 11/02/18  Yes Baity, RCoralie Keens NP  bictegravir-emtricitabine-tenofovir AF (BIKTARVY) 50-200-25 MG TABS tablet Take 1 tablet by mouth daily. 10/14/18  Yes DRaleigh Callas NP  cetirizine (ZYRTEC) 10 MG tablet Take 10 mg by mouth daily.   Yes [provider]  fluticasone (FLONASE) 50 MCG/ACT nasal spray Place 1 spray into both nostrils daily as needed for allergies or rhinitis.   Yes [provider]  glipiZIDE (GLUCOTROL) 10 MG tablet TAKE 1 TABLET TWICE A DAY BEFORE MEALS 11/02/18  Yes Baity, RCoralie Keens NP  glucose 4 GM chewable tablet Chew 1 tablet (4 g total) by mouth as needed for low blood sugar. 05/07/18  Yes GRia Bush MD  mirtazapine (REMERON) 7.5 MG tablet Take 1 tablet (7.5 mg total) by mouth at bedtime. 07/16/18  Yes BJearld Fenton NP  potassium chloride SA (K-DUR) 20 MEQ tablet TAKE 1 TABLET DAILY 11/02/18  Yes BJearld Fenton NP  topiramate (TOPAMAX) 100 MG tablet TAKE 1 TABLET AT BEDTIME AS NEEDED 11/02/18  Yes Baity, RCoralie Keens NP  topiramate (TOPAMAX) 50 MG tablet Take 50 mg by mouth at bedtime.   Yes [provider]  diclofenac sodium (VOLTAREN) 1 % GEL Apply 1 application topically 3 (three) times daily. Left knee Patient not taking: Reported on 10/14/2018 03/29/18   SMeredith Staggers MD  glucose blood (TRUE METRIX BLOOD GLUCOSE TEST) test strip 1 each by Other route 2 (two) times daily. Use as instructed 07/06/18   BJearld Fenton NP  HYDROcodone-acetaminophen (NORCO/VICODIN) 5-325 MG tablet Take 1 tablet by mouth every 6 (six) hours as needed for pain. 08/18/18   [provider]     Positive ROS: All other systems have been reviewed and were otherwise negative with the exception of those mentioned in the HPI and as above.  Physical Exam: General: Alert, no acute distress Cardiovascular: Regular rate and rhythm, no murmurs rubs or gallops.  No pedal edema Respiratory: Clear  to auscultation bilaterally, no wheezes rales or rhonchi. No cyanosis, no use of accessory musculature GI: No organomegaly, abdomen is soft and non-tender nondistended with positive bowel sounds. Skin: Skin intact, no lesions within the operative field. Neurologic: Sensation intact distally Psychiatric: Patient is competent for consent with normal mood and affect Lymphatic: No cervical lymphadenopathy  MUSCULOSKELETAL: Left shoulder:  Patient had block by anesthesia already, but in the office had pain with active forward elevation and abduction.  She had pain with a downward directed force to her abducted shoulder.  She had  no obvious rotator cuff weakness.   She is NVI.     Assessment: M75.42 Impingement Syndrome left shoulder  Plan: Plan for Procedure(s): LEFT SHOULDER ARTHROSCOPIC SUBACROMIAL DECOMPRESSION, DISTAL CLAVICLE EXCISION AND POSSIBLE ROTATOR CUFF REPAIR   I have described the details of the operation as well as the postoperative course with the patient.  I discussed the risks and benefits of surgery. The risks include but are not limited to infection, bleeding, nerve or blood vessel injury, joint stiffness or loss of motion, persistent pain, weakness or instability and hardware failure if used in the case and the need for further surgery. Medical risks include but are not limited to DVT and pulmonary embolism, myocardial infarction, stroke, pneumonia, respiratory failure and death. Patient understood these risks and wished to proceed.     Thornton Park, MD   11/30/2018 1:12 PM

## 2018-11-30 NOTE — Anesthesia Procedure Notes (Signed)
Procedure Name: Intubation Date/Time: 11/30/2018 2:41 PM Performed by: Janna Arch, CRNA Pre-anesthesia Checklist: Patient identified, Emergency Drugs available, Suction available, Patient being monitored and Timeout performed Patient Re-evaluated:Patient Re-evaluated prior to induction Oxygen Delivery Method: Circle system utilized Preoxygenation: Pre-oxygenation with 100% oxygen Induction Type: IV induction Ventilation: Mask ventilation without difficulty Laryngoscope Size: Glidescope and 3 Grade View: Grade I Tube type: Oral Tube size: 7.0 mm Number of attempts: 2 Placement Confirmation: ETT inserted through vocal cords under direct vision,  positive ETCO2 and breath sounds checked- equal and bilateral Secured at: 21 cm Tube secured with: Tape Dental Injury: Teeth and Oropharynx as per pre-operative assessment  Comments: DL x 1 with mac 3, anterior, grade 2 view, unable to pass ett. Easy mask aw, dl with glidescope 3, gradw 1

## 2018-11-30 NOTE — Transfer of Care (Signed)
Immediate Anesthesia Transfer of Care Note  Patient: [name]  Procedure(s) Performed: SHOULDER ARTHROSCOPY WITH ROTATOR CUFF REPAIR AND SUBACROMIAL DECOMPRESSION (Left )  Patient Location: PACU  Anesthesia Type: General, Regional  Level of Consciousness: awake, alert  and patient cooperative  Airway and Oxygen Therapy: Patient Spontanous Breathing and Patient connected to supplemental oxygen  Post-op Assessment: Post-op Vital signs reviewed, Patient's Cardiovascular Status Stable, Respiratory Function Stable, Patent Airway and No signs of Nausea or vomiting  Post-op Vital Signs: Reviewed and stable  Complications: No apparent anesthesia complications

## 2018-11-30 NOTE — Op Note (Signed)
11/30/2018  3:48 PM  PATIENT:  [name]    PRE-OPERATIVE DIAGNOSIS:  M75.42 Impingement Syndrome left shoulder with acromioclavicular joint arthrosis and possible rotator cuff tear  POST-OPERATIVE DIAGNOSIS: Left shoulder impingement with AC joint arthrosis  PROCEDURE:  LEFT SHOULDER ARTHROSCOPIC SUBACROMIAL DECOMPRESSION AND DISTAL CLAVICLE EXCISION  SURGEON:  Thornton Park, MD  ANESTHESIA:   General  PREOPERATIVE INDICATIONS:  [name] is a  63 y.o. female with a diagnosis of M75.42 Impingement Syndrome left shoulder who failed conservative measures and elected for surgical management.    I discussed the risks and benefits of surgery. The risks include but are not limited to infection, bleeding requiring blood transfusion, nerve or blood vessel injury, joint stiffness or loss of motion, persistent pain, weakness or instability, malunion, nonunion and hardware failure and the need for further surgery. Medical risks include but are not limited to DVT and pulmonary embolism, myocardial infarction, stroke, pneumonia, respiratory failure and death. Patient understood these risks and wished to proceed.   OPERATIVE FINDINGS: The left shoulder rotator cuff and biceps tendon were intact. There is mild fraying of the superior labrum. Patient had a severe subacromial bursitis and advanced acromioclavicular joint arthrosis.  OPERATIVE PROCEDURE: The patient was met in the preoperative area. The left shoulder was signed with the word yes and my initials according the hospital's correct site of surgery protocol.  A pre-op history and physical was updated.  The patient underwent interscalene block in the pre-op area.  She was then brought to the OR and underwent general anesthesia. The patient was placed in a beachchair position.  A spider arm positioner was used for this case. Examination under anesthesia revealed significant limitation of abduction of only approximately 90 degrees is  likely chronic due to the patient's stroke affecting her left side.  Patient states she has limited ROM of left shoulder prior to developing left shoulder pain.  She had no instability with load shift testing. The patient had a negative sulcus sign.  Patient was prepped and draped in a sterile fashion. A timeout was performed to verify the patient's name, date of birth, medical record number, correct site of surgery and correct procedure to be performed there was also used to verify the patient received antibiotics that all appropriate instruments, implants and radiographs studies were available in the room. Once all in attendance were in agreement case began.  A gentle manipulation of the left shoulder helped to improvement patient's abduction and extension.  Her forward elevation was to approximate 130 to 140 degrees.  Bony landmarks were drawn out with a surgical marker along with proposed arthroscopy incisions. These were pre-injected with 1% lidocaine plain. An 11 blade was used to establish a posterior portal through which the arthroscope was placed in the glenohumeral joint. A full diagnostic examination of the shoulder was performed. There was no tear of the supraspinatus identified.  An 18-gauge spinal needle was used to place a 0 PDS suture through the supraspinatus for identification from the bursal side.  The superior labrum had fraying that was debrided with a 4.0 resector shaver blade.  No labral attachment was identified.  The arthroscope was then placed in the subacromial space.  Extensive bursitis was encountered. A lateral portal was established with an 18-gauge spinal needle for localization. A 90 ArthroCare wand and 4.0 resector shaver blade were used to form an extensive subdeltoid and subacromial bursectomy. The 0 PDS suture was identified. There was no evidence of a bursal sided tear of the supra  or infraspinatus.   A subacromial decompression was performed using a 5.5 mm resector  shaver blade from the lateral portal. The 5.5 mm resector shaver blade was then placed through the anterior portal.  A distal clavicle excision was performed. Subacromial space was then copiously irrigated to remove all osseous debris. Final arthroscopic images were taken. Arthroscopic images were then removed.  Skin closure for the arthroscopic incisions was performed with 4-0 nylon.    A dry sterile dressing was applied.  The patient was placed in a sling.  All sharp and it instrument counts were correct at the conclusion of the case. I was scrubbed and present for the entire case. I spoke with the patient's friend, Steward Drone, postoperatively, at the patient's request, to let her know the case had been performed without complication and the patient was stable in recovery room.

## 2018-11-30 NOTE — Anesthesia Procedure Notes (Signed)
Anesthesia Regional Block: Interscalene brachial plexus block   Pre-Anesthetic Checklist: ,, timeout performed, Correct Patient, Correct Site, Correct Laterality, Correct Procedure, Correct Position, site marked, Risks and benefits discussed,  Surgical consent,  Pre-op evaluation,  At surgeon's request and post-op pain management  Laterality: Left  Prep: chloraprep       Needles:  Injection technique: Single-shot  Needle Type: Stimiplex     Needle Length: 10cm  Needle Gauge: 21     Additional Needles:   Procedures:,,,, ultrasound used (permanent image in chart),,,,  Narrative:  Start time: 11/30/2018 12:23 PM End time: 11/30/2018 12:31 PM Injection made incrementally with aspirations every 5 mL.  Performed by: Personally  Anesthesiologist: Darrin Nipper, MD  Additional Notes: Functioning IV was confirmed and monitors applied. Ultrasound guidance: relevant anatomy identified, needle position confirmed, local anesthetic spread visualized around nerve(s)., vascular puncture avoided.  Image printed for medical record.  Negative aspiration and no paresthesias; incremental administration of local anesthetic. The patient tolerated the procedure well. Vitals signes recorded in RN notes.

## 2018-11-30 NOTE — Anesthesia Postprocedure Evaluation (Signed)
Anesthesia Post Note  Patient: [name]  Procedure(s) Performed: SHOULDER ARTHROSCOPY WITH SUBACROMIAL DECOMPRESSION AND DISTAL CLAVICAL EXCISION (Left Shoulder)  Patient location during evaluation: PACU Anesthesia Type: Regional Level of consciousness: awake and alert, oriented and patient cooperative Pain management: pain level controlled Vital Signs Assessment: post-procedure vital signs reviewed and stable Respiratory status: spontaneous breathing, nonlabored ventilation and respiratory function stable Cardiovascular status: blood pressure returned to baseline and stable Postop Assessment: adequate PO intake Anesthetic complications: no    Darrin Nipper

## 2018-11-30 NOTE — Progress Notes (Signed)
Assisted Darrin Nipper, ANMD with left, ultrasound guided, supraclavicular block. Side rails up, monitors on throughout procedure. See vital signs in flow sheet. Tolerated Procedure well.

## 2018-12-01 ENCOUNTER — Encounter: Payer: Self-pay | Admitting: Orthopedic Surgery

## 2018-12-01 ENCOUNTER — Telehealth: Payer: Self-pay | Admitting: Internal Medicine

## 2018-12-01 DIAGNOSIS — Z0271 Encounter for disability determination: Secondary | ICD-10-CM

## 2018-12-01 NOTE — Telephone Encounter (Signed)
Spoke with pt to let her know her fmla paperwork was refaxed  She mention she needed her handicap sticker renewed and needs form please advise when ready for pick up  Also wanted to let you know she had shoulder surgery yesterday and she is doing good  Best number [phone] Pt mention she will be sending blood sugar readings through my chart to you

## 2018-12-01 NOTE — Telephone Encounter (Signed)
Copy for pt Copy for scan

## 2018-12-01 NOTE — Telephone Encounter (Signed)
Pt aware.

## 2018-12-01 NOTE — Telephone Encounter (Signed)
Done. In MYD boxd

## 2018-12-02 ENCOUNTER — Ambulatory Visit: Payer: BLUE CROSS/BLUE SHIELD

## 2018-12-02 ENCOUNTER — Encounter: Payer: BLUE CROSS/BLUE SHIELD | Admitting: Physical Therapy

## 2018-12-02 ENCOUNTER — Ambulatory Visit: Payer: BLUE CROSS/BLUE SHIELD | Admitting: Occupational Therapy

## 2018-12-03 ENCOUNTER — Encounter: Payer: Self-pay | Admitting: Internal Medicine

## 2018-12-07 ENCOUNTER — Ambulatory Visit (INDEPENDENT_AMBULATORY_CARE_PROVIDER_SITE_OTHER): Payer: BLUE CROSS/BLUE SHIELD

## 2018-12-07 ENCOUNTER — Encounter: Payer: BLUE CROSS/BLUE SHIELD | Admitting: Physical Therapy

## 2018-12-07 ENCOUNTER — Other Ambulatory Visit: Payer: Self-pay

## 2018-12-07 DIAGNOSIS — Z0000 Encounter for general adult medical examination without abnormal findings: Secondary | ICD-10-CM | POA: Diagnosis not present

## 2018-12-07 DIAGNOSIS — Z23 Encounter for immunization: Secondary | ICD-10-CM

## 2018-12-07 MED ORDER — HEPATITIS A VACCINE 1440 EL U/ML IM SUSP
1.0000 mL | Freq: Once | INTRAMUSCULAR | Status: AC
  Administered 2018-12-07: 11:00:00 1440 [IU] via INTRAMUSCULAR

## 2018-12-07 MED ORDER — HEPATITIS B VAC RECOMBINANT 10 MCG/ML IJ SUSP
1.0000 mL | Freq: Once | INTRAMUSCULAR | Status: AC
  Administered 2018-12-07: 11:00:00 10 ug via INTRAMUSCULAR

## 2018-12-07 NOTE — Progress Notes (Signed)
Hep B Vaccine given right upper arm  subcutaneous.   Hep A  Vaccine given right deltoid.    Both in right at patient request due to left shoulder surgery last week.   Laverle Patter, RN

## 2018-12-09 ENCOUNTER — Encounter: Payer: BLUE CROSS/BLUE SHIELD | Admitting: Physical Therapy

## 2018-12-13 DIAGNOSIS — M25512 Pain in left shoulder: Secondary | ICD-10-CM | POA: Diagnosis not present

## 2018-12-13 DIAGNOSIS — M25612 Stiffness of left shoulder, not elsewhere classified: Secondary | ICD-10-CM | POA: Diagnosis not present

## 2018-12-16 DIAGNOSIS — M25512 Pain in left shoulder: Secondary | ICD-10-CM | POA: Diagnosis not present

## 2018-12-16 DIAGNOSIS — M25612 Stiffness of left shoulder, not elsewhere classified: Secondary | ICD-10-CM | POA: Diagnosis not present

## 2018-12-17 ENCOUNTER — Telehealth: Payer: Self-pay

## 2018-12-17 NOTE — Telephone Encounter (Signed)
[facility] Night - Client Nonclinical Telephone Record AccessNurse Client [facility] Night - Client Client Site [place] Physician Webb Silversmith - NP Contact Type Call Who Is Calling Patient / Member / Family / Caregiver Caller Name [place] Phone Number [phone] Patient Name [name] Patient DOB Nov 16, 1955 Call Type Message Only Information Provided Reason for Call Request for General Office Information Initial Comment Caller states she wants to know if someone is in the office, she needs to speak to someone at the front desk. Dr. 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 Additional Comment Call Closed By: Mable Paris Transaction Date/Time: 12/16/2018 5:42:25 PM (ET

## 2018-12-17 NOTE — Telephone Encounter (Signed)
Pt called stating her paperwork was denied because they need an MD signature  Instead of regina's NP  Paperwork in dr Everardo Beals in box for signature

## 2018-12-17 NOTE — Telephone Encounter (Signed)
I signed it now

## 2018-12-17 NOTE — Telephone Encounter (Signed)
Paperwork faxed °

## 2018-12-20 DIAGNOSIS — M25512 Pain in left shoulder: Secondary | ICD-10-CM | POA: Diagnosis not present

## 2018-12-20 DIAGNOSIS — M25612 Stiffness of left shoulder, not elsewhere classified: Secondary | ICD-10-CM | POA: Diagnosis not present

## 2018-12-28 DIAGNOSIS — M25612 Stiffness of left shoulder, not elsewhere classified: Secondary | ICD-10-CM | POA: Diagnosis not present

## 2018-12-28 DIAGNOSIS — M25512 Pain in left shoulder: Secondary | ICD-10-CM | POA: Diagnosis not present

## 2018-12-30 DIAGNOSIS — M25512 Pain in left shoulder: Secondary | ICD-10-CM | POA: Diagnosis not present

## 2018-12-30 DIAGNOSIS — M25612 Stiffness of left shoulder, not elsewhere classified: Secondary | ICD-10-CM | POA: Diagnosis not present

## 2019-01-05 DIAGNOSIS — M25512 Pain in left shoulder: Secondary | ICD-10-CM | POA: Diagnosis not present

## 2019-01-05 DIAGNOSIS — M25612 Stiffness of left shoulder, not elsewhere classified: Secondary | ICD-10-CM | POA: Diagnosis not present

## 2019-01-06 DIAGNOSIS — M25512 Pain in left shoulder: Secondary | ICD-10-CM | POA: Diagnosis not present

## 2019-01-06 DIAGNOSIS — M25612 Stiffness of left shoulder, not elsewhere classified: Secondary | ICD-10-CM | POA: Diagnosis not present

## 2019-01-13 DIAGNOSIS — M25512 Pain in left shoulder: Secondary | ICD-10-CM | POA: Diagnosis not present

## 2019-01-13 DIAGNOSIS — M25612 Stiffness of left shoulder, not elsewhere classified: Secondary | ICD-10-CM | POA: Diagnosis not present

## 2019-01-18 DIAGNOSIS — M25612 Stiffness of left shoulder, not elsewhere classified: Secondary | ICD-10-CM | POA: Diagnosis not present

## 2019-01-18 DIAGNOSIS — M25512 Pain in left shoulder: Secondary | ICD-10-CM | POA: Diagnosis not present

## 2019-01-20 DIAGNOSIS — M25512 Pain in left shoulder: Secondary | ICD-10-CM | POA: Diagnosis not present

## 2019-01-20 DIAGNOSIS — M25612 Stiffness of left shoulder, not elsewhere classified: Secondary | ICD-10-CM | POA: Diagnosis not present

## 2019-01-28 ENCOUNTER — Other Ambulatory Visit: Payer: Self-pay | Admitting: Internal Medicine

## 2019-02-14 ENCOUNTER — Ambulatory Visit (INDEPENDENT_AMBULATORY_CARE_PROVIDER_SITE_OTHER): Payer: BC Managed Care – PPO | Admitting: Internal Medicine

## 2019-02-14 ENCOUNTER — Other Ambulatory Visit: Payer: Self-pay

## 2019-02-14 ENCOUNTER — Encounter: Payer: Self-pay | Admitting: Internal Medicine

## 2019-02-14 ENCOUNTER — Other Ambulatory Visit: Payer: Self-pay | Admitting: Internal Medicine

## 2019-02-14 VITALS — BP 134/84 | Temp 98.0°F | Ht 62.0 in | Wt 145.0 lb

## 2019-02-14 DIAGNOSIS — Z124 Encounter for screening for malignant neoplasm of cervix: Secondary | ICD-10-CM

## 2019-02-14 DIAGNOSIS — IMO0002 Reserved for concepts with insufficient information to code with codable children: Secondary | ICD-10-CM

## 2019-02-14 DIAGNOSIS — E1165 Type 2 diabetes mellitus with hyperglycemia: Secondary | ICD-10-CM | POA: Diagnosis not present

## 2019-02-14 DIAGNOSIS — R252 Cramp and spasm: Secondary | ICD-10-CM

## 2019-02-14 DIAGNOSIS — G43009 Migraine without aura, not intractable, without status migrainosus: Secondary | ICD-10-CM

## 2019-02-14 DIAGNOSIS — Z0001 Encounter for general adult medical examination with abnormal findings: Secondary | ICD-10-CM

## 2019-02-14 DIAGNOSIS — E559 Vitamin D deficiency, unspecified: Secondary | ICD-10-CM | POA: Diagnosis not present

## 2019-02-14 DIAGNOSIS — E118 Type 2 diabetes mellitus with unspecified complications: Secondary | ICD-10-CM | POA: Diagnosis not present

## 2019-02-14 DIAGNOSIS — I63511 Cerebral infarction due to unspecified occlusion or stenosis of right middle cerebral artery: Secondary | ICD-10-CM

## 2019-02-14 DIAGNOSIS — F339 Major depressive disorder, recurrent, unspecified: Secondary | ICD-10-CM | POA: Diagnosis not present

## 2019-02-14 DIAGNOSIS — E782 Mixed hyperlipidemia: Secondary | ICD-10-CM

## 2019-02-14 DIAGNOSIS — B20 Human immunodeficiency virus [HIV] disease: Secondary | ICD-10-CM | POA: Diagnosis not present

## 2019-02-14 DIAGNOSIS — K51919 Ulcerative colitis, unspecified with unspecified complications: Secondary | ICD-10-CM

## 2019-02-14 LAB — COMPREHENSIVE METABOLIC PANEL
ALT: 21 U/L (ref 0–35)
AST: 22 U/L (ref 0–37)
Albumin: 4.2 g/dL (ref 3.5–5.2)
Alkaline Phosphatase: 119 U/L — ABNORMAL HIGH (ref 39–117)
BUN: 15 mg/dL (ref 6–23)
CO2: 21 mEq/L (ref 19–32)
Calcium: 9.2 mg/dL (ref 8.4–10.5)
Chloride: 110 mEq/L (ref 96–112)
Creatinine, Ser: 0.93 mg/dL (ref 0.40–1.20)
GFR: 73.91 mL/min (ref >60.00)
Glucose, Bld: 113 mg/dL — ABNORMAL HIGH (ref 70–99)
Potassium: 3.8 mEq/L (ref 3.5–5.1)
Sodium: 139 mEq/L (ref 135–145)
Total Bilirubin: 0.4 mg/dL (ref 0.2–1.2)
Total Protein: 7.1 g/dL (ref 6.0–8.3)

## 2019-02-14 LAB — CBC
HCT: 35.7 % — ABNORMAL LOW (ref 36.0–46.0)
Hemoglobin: 11.9 g/dL — ABNORMAL LOW (ref 12.0–15.0)
MCHC: 33.2 g/dL (ref 30.0–36.0)
MCV: 95.5 fl (ref 78.0–100.0)
Platelets: 217 10*3/uL (ref 150.0–400.0)
RBC: 3.74 Mil/uL — ABNORMAL LOW (ref 3.87–5.11)
RDW: 13.5 % (ref 11.5–15.5)
WBC: 4.4 10*3/uL (ref 4.0–10.5)

## 2019-02-14 LAB — LIPID PANEL
Cholesterol: 131 mg/dL (ref 0–200)
HDL: 47.1 mg/dL (ref >39.00)
LDL Cholesterol: 63 mg/dL (ref 0–99)
NonHDL: 83.62
Total CHOL/HDL Ratio: 3
Triglycerides: 101 mg/dL (ref 0.0–149.0)
VLDL: 20.2 mg/dL (ref 0.0–40.0)

## 2019-02-14 LAB — MICROALBUMIN / CREATININE URINE RATIO
Creatinine,U: 120.9 mg/dL
Microalb Creat Ratio: 0.6 mg/g (ref 0.0–30.0)
Microalb, Ur: <0.7 mg/dL (ref 0.0–1.9)

## 2019-02-14 LAB — HEMOGLOBIN A1C: Hgb A1c MFr Bld: 6.6 % — ABNORMAL HIGH (ref 4.6–6.5)

## 2019-02-14 MED ORDER — MIRTAZAPINE 15 MG PO TABS: 15.0000 mg | ORAL_TABLET | Freq: Every day | ORAL | 3 refills | Status: DC

## 2019-02-14 NOTE — Assessment & Plan Note (Addendum)
Increase Remeron to 15 mg daily Support offered today. Will monitor

## 2019-02-14 NOTE — Assessment & Plan Note (Signed)
Not currently an issue. Will monitor.

## 2019-02-14 NOTE — Assessment & Plan Note (Signed)
Follows with neurology.  Is gaining some strength in left arm. Continue ASA and Atorvastatin. CMP, A1C, Lipids checked today

## 2019-02-14 NOTE — Patient Instructions (Signed)

## 2019-02-14 NOTE — Assessment & Plan Note (Signed)
Currently managed with taking Topamax daily. Will monitor.

## 2019-02-14 NOTE — Progress Notes (Addendum)
Subjective:    Patient ID: [name], female    DOB: March 23, 1956, 63 y.o.   MRN: [id number]  HPI  Patient presents to the clinic today for her annual physical examination.  She is also due for a follow up of chronic conditions.  Depression:  Currently managed on Remeron.  She does not see a therapist.  She denies SI/HI.  DM 2:  Her las A1C was 6.6 on 05/2018.  She takes her Glipizide as prescribed.  She tries to consume a low carb diet.  Her sugars range 70-115. She checks her feet routinely. Her last eye exam was in the last year.  HIV:  She follows with Janene Madeira, NP for this.  She currently takes [place] as prescribed.  HLD:  Her last LDL was 50 on 05/2018.  She is currently taking her Atorvastatin as prescribed.  She denies myalgias on this medication.  She tries to consume a low fat diet.  Migraines:  Currently maintained on Topamax.  She gets migraines less often with taking it daily.  Stroke: Right BG infarct on 02/2018 with residual left hemiparesis.  She completed a stay at [facility] for rehab.  She follows with neurology.  She takes ASA and Atorvastatin as prescribed.  Ulcerative Colitis: Currently not an issue off meds.  Flu: 04/2018 Tetanus: unsure Pneumovax: 02/2018 Shingrix: never Pap Smear: > 5 years ago Mammogram: 05/2018 Bone Density: never Colon Screening: 11/2012 Dentist: biannually  Vision Screening: annually  Diet:  She tries to consume a low fat diet and watch her carb intake. Exercise:  She does not engage in exercise.  Review of Systems  Past Medical History:  Diagnosis Date  . Arthritis    fingers  . Asthma   . Diabetes mellitus without complication ([place]) 09-01-73   hx. NIDDM-dx. 3 weeks ago  . Headache    migraines - none since stroke  . HIV (human immunodeficiency virus infection) ([place])   . Hyperlipidemia    hx  . Seasonal allergies   . Stroke ([place]) 02/18/2018   Residual Left side weakness and speech issues  . Ulcerative colitis  ([place])   . Wears contact lenses   . Wears partial dentures    upper    Current Outpatient Medications  Medication Sig Dispense Refill  . aspirin EC 81 MG EC tablet Take 1 tablet (81 mg total) by mouth daily.    Marland Kitchen atorvastatin (LIPITOR) 40 MG tablet TAKE 1 TABLET DAILY AT 6 P.M. 90 tablet 0  . bictegravir-emtricitabine-tenofovir AF (BIKTARVY) 50-200-25 MG TABS tablet Take 1 tablet by mouth daily. 90 tablet 2  . cetirizine (ZYRTEC) 10 MG tablet Take 10 mg by mouth daily.    . fluticasone (FLONASE) 50 MCG/ACT nasal spray Place 1 spray into both nostrils daily as needed for allergies or rhinitis.    Marland Kitchen glipiZIDE (GLUCOTROL) 10 MG tablet TAKE 1 TABLET TWICE A DAY BEFORE MEALS 180 tablet 0  . glucose 4 GM chewable tablet Chew 1 tablet (4 g total) by mouth as needed for low blood sugar. 30 tablet 3  . glucose blood (TRUE METRIX BLOOD GLUCOSE TEST) test strip 1 each by Other route 2 (two) times daily. Use as instructed 100 each 2  . KLOR-CON M20 20 MEQ tablet TAKE 1 TABLET DAILY 90 tablet 0  . mirtazapine (REMERON) 7.5 MG tablet Take 1 tablet (7.5 mg total) by mouth at bedtime. 30 tablet 5  . ondansetron (ZOFRAN) 4 MG tablet Take 1 tablet (4 mg total) by  mouth every 8 (eight) hours as needed for nausea or vomiting. 30 tablet 0  . oxyCODONE (OXY IR/ROXICODONE) 5 MG immediate release tablet Take 1 tablet (5 mg total) by mouth every 4 (four) hours as needed for severe pain. 40 tablet 0  . topiramate (TOPAMAX) 100 MG tablet TAKE 1 TABLET AT BEDTIME AS NEEDED 90 tablet 0  . topiramate (TOPAMAX) 50 MG tablet Take 50 mg by mouth at bedtime.     No current facility-administered medications for this visit.     Allergies  Allergen Reactions  . Azithromycin Itching    Muscle aches  . Erythromycin Nausea And Vomiting  . Penicillins Nausea And Vomiting    Did it involve swelling of the face/tongue/throat, SOB, or low BP? No Did it involve sudden or severe rash/hives, skin peeling, or any reaction on the  inside of your mouth or nose? No Did you need to seek medical attention at a hospital or doctor's office? No When did it last happen?childhood allergy If all above answers are "NO", may proceed with cephalosporin use.   . Tetracycline Nausea And Vomiting    Family History  Problem Relation Age of Onset  . Hypertension Mother   . Diabetes Mother   . Cancer Father        lung ca  . Seizures Daughter        47yr    Social History   Socioeconomic History  . Marital status: Legally Separated    Spouse name: Not on file  . Number of children: Not on file  . Years of education: Not on file  . Highest education level: Not on file  Occupational History  . Not on file  Social Needs  . Financial resource strain: Not on file  . Food insecurity    Worry: Not on file    Inability: Not on file  . Transportation needs    Medical: Not on file    Non-medical: Not on file  Tobacco Use  . Smoking status: Never Smoker  . Smokeless tobacco: Never Used  Substance and Sexual Activity  . Alcohol use: Not Currently    Alcohol/week: 0.0 standard drinks  . Drug use: No  . Sexual activity: Not Currently    Partners: Male    Comment: offered condoms  Lifestyle  . Physical activity    Days per week: Not on file    Minutes per session: Not on file  . Stress: Not on file  Relationships  . Social [place] phone: Not on file    Gets together: Not on file    Attends religious service: Not on file    Active member of club or organization: Not on file    Attends meetings of clubs or organizations: Not on file    Relationship status: Not on file  . Intimate partner violence    Fear of current or ex partner: Not on file    Emotionally abused: Not on file    Physically abused: Not on file    Forced sexual activity: Not on file  Other Topics Concern  . Not on file  Social History Narrative  . Not on file     Constitutional: Denies fever, malaise, fatigue, headache or  abrupt weight changes.  HEENT: Denies eye pain, eye redness, ear pain, ringing in the ears, wax buildup, runny nose, nasal congestion, bloody nose, or sore throat. Respiratory: Denies difficulty breathing, shortness of breath, cough or sputum production.   Cardiovascular:  Denies chest pain, chest tightness, palpitations or swelling in the hands or feet.  Gastrointestinal: Denies abdominal pain, bloating, constipation, diarrhea or blood in the stool.  GU: Denies urgency, frequency, pain with urination, burning sensation, blood in urine, odor or discharge. Musculoskeletal: Complains of left sided weakness as residual from stroke. Complains of intense calf muscle spasms at night.   Skin: Denies redness, rashes, lesions or ulcercations.  Neurological: Denies dizziness, difficulty with memory, difficulty with speech or problems with balance and coordination.  Psych: Has history of anxiety/depression.  Denies SI/HI.  No other specific complaints in a complete review of systems (except as listed in HPI above).     Objective:   Physical Exam   BP 134/84   Temp 98 F (36.7 C) (Oral)   Ht 5' 2"  (1.575 m)   Wt 145 lb (65.8 kg)   BMI 26.52 kg/m    Wt Readings from Last 3 Encounters:  11/30/18 142 lb (64.4 kg)  09/27/18 138 lb 8 oz (62.8 kg)  09/16/18 139 lb (63 kg)    General: Appears her stated age, well developed, well nourished in NAD. Skin: Warm, dry and intact. No rashes noted. HEENT: Head: normal shape and size; Eyes: sclera white, no icterus, conjunctiva pink, PERRLA and EOMs intact; Ears: Tm's gray and intact, normal light reflex;  Neck:  Neck supple, trachea midline. No masses, lumps or thyromegaly present.  Cardiovascular: Normal rate and rhythm. S1,S2 noted.  No murmur, rubs or gallops noted. No JVD or BLE edema. No carotid bruits noted. Pulmonary/Chest: Normal effort and positive vesicular breath sounds. No respiratory distress. No wheezes, rales or ronchi noted.  Abdomen: Soft  and nontender. Normal bowel sounds. No distention or masses noted. Liver, spleen and kidneys non palpable. Musculoskeletal: LUE,LLE strength 4/5.  Normal range of motion and strength to RUE, RLE.  No difficulty with gait.  Neurological: Alert and oriented. Cranial nerves II-XII grossly intact. Coordination normal.  Psychiatric: Mood and affect normal. Behavior is normal. Judgment and thought content normal.     BMET    Component Value Date/Time   NA 143 09/27/2018 1053   NA 138 06/23/2014 0503   K 4.2 09/27/2018 1053   K 4.2 06/23/2014 0503   CL 111 (H) 09/27/2018 1053   CL 104 06/23/2014 0503   CO2 24 09/27/2018 1053   CO2 27 06/23/2014 0503   GLUCOSE 95 09/27/2018 1053   GLUCOSE 246 (H) 06/23/2014 0503   BUN 11 09/27/2018 1053   BUN 17 06/23/2014 0503   CREATININE 0.94 09/27/2018 1053   CALCIUM 9.5 09/27/2018 1053   CALCIUM 8.7 06/23/2014 0503   GFRNONAA 65 09/27/2018 1053   GFRAA 76 09/27/2018 1053    Lipid Panel     Component Value Date/Time   CHOL 123 06/08/2018 1606   CHOL 211 (H) 06/22/2014 1432   TRIG 154.0 (H) 06/08/2018 1606   TRIG 473 (H) 06/22/2014 1432   HDL 41.70 06/08/2018 1606   HDL 42 06/22/2014 1432   CHOLHDL 3 06/08/2018 1606   VLDL 30.8 06/08/2018 1606   VLDL SEE COMMENT 06/22/2014 1432   LDLCALC 51 06/08/2018 1606   LDLCALC SEE COMMENT 06/22/2014 1432    CBC    Component Value Date/Time   WBC 4.5 09/27/2018 1017   RBC 3.70 (L) 09/27/2018 1017   HGB 11.4 (L) 09/27/2018 1017   HGB 12.2 06/23/2014 0503   HCT 35.1 (L) 09/27/2018 1017   HCT 37.2 06/23/2014 0503   PLT 228 09/27/2018 1017  PLT 251 06/23/2014 0503   MCV 94.9 09/27/2018 1017   MCV 89 06/23/2014 0503   MCH 30.8 09/27/2018 1017   MCHC 32.5 09/27/2018 1017   RDW 13.2 09/27/2018 1017   RDW 13.6 06/23/2014 0503   LYMPHSABS 1.7 09/27/2018 1017   LYMPHSABS 1.8 06/23/2014 0503   MONOABS 0.3 09/27/2018 1017   MONOABS 0.4 06/23/2014 0503   EOSABS 0.1 09/27/2018 1017   EOSABS 0.0  06/23/2014 0503   BASOSABS 0.0 09/27/2018 1017   BASOSABS 0.0 06/23/2014 0503    Hgb A1C Lab Results  Component Value Date   HGBA1C 6.5 (A) 09/13/2018           Assessment & Plan:    Preventative Health Maintenance:  Encouraged patient to get a flu shot in the fall.   Tetanus vaccine given today.   Pap smear performed today. She declines STD screening. Due for mammogram and bone density in November 2020.  Encouraged patient to call and set up appointment for both.  Encouraged her to see her dentist biannually and eye doctor annually.   Encouraged patient to eat a balanced diet and increase aerobic exercise. CBC, CMP, A1C, Lipids, Vitamin D, urine Microalbumin checked today.  Nocturnal Muscle Cramps in Bilateral Calves:  CMP checked today.  She takes daily potassium supplementation. May consider adding low dose Gabapentin at night if labs normal for possible restless leg syndrome.  Will follow up after labs result.  Return in 6 months for follow up of chronic conditions.    Webb Silversmith, NP

## 2019-02-14 NOTE — Assessment & Plan Note (Signed)
Follows with Janene Madeira, NP. Currently taking Biktarvy as prescribed. Will monitor.

## 2019-02-14 NOTE — Assessment & Plan Note (Addendum)
Currently managed on Glipizide as prescribed. A1C, CMP, Microalbumin checked today. Encouraged patient to consume low carb diet and increase exercise. Foot exam today Encouraged yearly eye exam Flu shot UTD Pneumovax UTD

## 2019-02-14 NOTE — Assessment & Plan Note (Addendum)
Managed on Atorvastatin. CMP, Lipids checked today. Encouraged consumption of low fat diet and increasing exercise as able.

## 2019-02-15 ENCOUNTER — Other Ambulatory Visit (HOSPITAL_COMMUNITY)
Admission: RE | Admit: 2019-02-15 | Discharge: 2019-02-15 | Disposition: A | Discharge status: Home / Self Care | Payer: BC Managed Care – PPO | Source: Ambulatory Visit | Attending: Internal Medicine | Admitting: Internal Medicine

## 2019-02-15 DIAGNOSIS — Z124 Encounter for screening for malignant neoplasm of cervix: Secondary | ICD-10-CM | POA: Diagnosis not present

## 2019-02-15 LAB — VITAMIN D 25 HYDROXY (VIT D DEFICIENCY, FRACTURES): VITD: 9.31 ng/mL — ABNORMAL LOW (ref 30.00–100.00)

## 2019-02-15 MED ORDER — VITAMIN D (ERGOCALCIFEROL) 1.25 MG (50000 UNIT) PO CAPS: 50000.0000 [IU] | ORAL_CAPSULE | ORAL | 0 refills | Status: DC

## 2019-02-15 NOTE — Addendum Note (Signed)
Addended by: Lurlean Nanny on: 02/15/2019 03:20 PM   Modules accepted: Orders

## 2019-02-22 LAB — CYTOLOGY - PAP
Diagnosis: NEGATIVE
HPV: NOT DETECTED

## 2019-03-01 ENCOUNTER — Telehealth: Payer: Self-pay

## 2019-03-01 ENCOUNTER — Other Ambulatory Visit: Payer: Self-pay

## 2019-03-01 ENCOUNTER — Encounter: Payer: Self-pay | Admitting: Emergency Medicine

## 2019-03-01 ENCOUNTER — Ambulatory Visit (INDEPENDENT_AMBULATORY_CARE_PROVIDER_SITE_OTHER)
Admission: EM | Admit: 2019-03-01 | Discharge: 2019-03-01 | Disposition: A | Discharge status: Other Acute Inpatient Hospital | Payer: BC Managed Care – PPO | Source: Home / Self Care

## 2019-03-01 ENCOUNTER — Emergency Department
Admission: EM | Admit: 2019-03-01 | Discharge: 2019-03-01 | Disposition: A | Discharge status: Home / Self Care | Payer: BC Managed Care – PPO | Attending: Emergency Medicine | Admitting: Emergency Medicine

## 2019-03-01 ENCOUNTER — Emergency Department: Payer: BC Managed Care – PPO

## 2019-03-01 DIAGNOSIS — E119 Type 2 diabetes mellitus without complications: Secondary | ICD-10-CM | POA: Insufficient documentation

## 2019-03-01 DIAGNOSIS — R200 Anesthesia of skin: Secondary | ICD-10-CM | POA: Diagnosis not present

## 2019-03-01 DIAGNOSIS — Z7982 Long term (current) use of aspirin: Secondary | ICD-10-CM | POA: Insufficient documentation

## 2019-03-01 DIAGNOSIS — J45909 Unspecified asthma, uncomplicated: Secondary | ICD-10-CM | POA: Insufficient documentation

## 2019-03-01 DIAGNOSIS — M5412 Radiculopathy, cervical region: Secondary | ICD-10-CM | POA: Diagnosis not present

## 2019-03-01 DIAGNOSIS — R202 Paresthesia of skin: Secondary | ICD-10-CM | POA: Diagnosis not present

## 2019-03-01 DIAGNOSIS — Z8673 Personal history of transient ischemic attack (TIA), and cerebral infarction without residual deficits: Secondary | ICD-10-CM | POA: Diagnosis not present

## 2019-03-01 DIAGNOSIS — Z7984 Long term (current) use of oral hypoglycemic drugs: Secondary | ICD-10-CM | POA: Diagnosis not present

## 2019-03-01 DIAGNOSIS — M79601 Pain in right arm: Secondary | ICD-10-CM | POA: Insufficient documentation

## 2019-03-01 LAB — CBC
HCT: 34.4 % — ABNORMAL LOW (ref 36.0–46.0)
Hemoglobin: 11.5 g/dL — ABNORMAL LOW (ref 12.0–15.0)
MCH: 31.5 pg (ref 26.0–34.0)
MCHC: 33.4 g/dL (ref 30.0–36.0)
MCV: 94.2 fL (ref 80.0–100.0)
Platelets: 201 10*3/uL (ref 150–400)
RBC: 3.65 MIL/uL — ABNORMAL LOW (ref 3.87–5.11)
RDW: 12.9 % (ref 11.5–15.5)
WBC: 5.7 10*3/uL (ref 4.0–10.5)
nRBC: 0 % (ref 0.0–0.2)

## 2019-03-01 LAB — COMPREHENSIVE METABOLIC PANEL
ALT: 106 U/L — ABNORMAL HIGH (ref 0–44)
AST: 82 U/L — ABNORMAL HIGH (ref 15–41)
Albumin: 3.9 g/dL (ref 3.5–5.0)
Alkaline Phosphatase: 123 U/L (ref 38–126)
Anion gap: 5 (ref 5–15)
BUN: 13 mg/dL (ref 8–23)
CO2: 27 mmol/L (ref 22–32)
Calcium: 8.6 mg/dL — ABNORMAL LOW (ref 8.9–10.3)
Chloride: 106 mmol/L (ref 98–111)
Creatinine, Ser: 0.85 mg/dL (ref 0.44–1.00)
GFR calc Af Amer: >60 mL/min (ref >60)
GFR calc non Af Amer: >60 mL/min (ref >60)
Glucose, Bld: 172 mg/dL — ABNORMAL HIGH (ref 70–99)
Potassium: 3.4 mmol/L — ABNORMAL LOW (ref 3.5–5.1)
Sodium: 138 mmol/L (ref 135–145)
Total Bilirubin: 0.4 mg/dL (ref 0.3–1.2)
Total Protein: 7 g/dL (ref 6.5–8.1)

## 2019-03-01 LAB — DIFFERENTIAL
Abs Immature Granulocytes: 0.01 10*3/uL (ref 0.00–0.07)
Basophils Absolute: 0 10*3/uL (ref 0.0–0.1)
Basophils Relative: 0 %
Eosinophils Absolute: 0.1 10*3/uL (ref 0.0–0.5)
Eosinophils Relative: 2 %
Immature Granulocytes: 0 %
Lymphocytes Relative: 40 %
Lymphs Abs: 2.3 10*3/uL (ref 0.7–4.0)
Monocytes Absolute: 0.6 10*3/uL (ref 0.1–1.0)
Monocytes Relative: 10 %
Neutro Abs: 2.8 10*3/uL (ref 1.7–7.7)
Neutrophils Relative %: 48 %

## 2019-03-01 LAB — PROTIME-INR
INR: 0.9 (ref 0.8–1.2)
Prothrombin Time: 12.2 seconds (ref 11.4–15.2)

## 2019-03-01 LAB — APTT: aPTT: 25 seconds (ref 24–36)

## 2019-03-01 NOTE — Discharge Instructions (Signed)
Leave urgent care and go directly to [facility] emergency department for further workup. I have call them to let them know you are coming.   Honor Loh, MSN, APRN, FNP-C, CEN Advanced Practice Provider [facility] 03/01/2019 4:49 PM

## 2019-03-01 NOTE — ED Notes (Signed)
Spoke with MD Joni Fears about CT scan, verbal order given at this time

## 2019-03-01 NOTE — ED Triage Notes (Signed)
Patient states she has had numbness and tingling in her right arm hand and face.  Patient called her regular Dr. Today and they suggested she come to [facility] .  Patien had a stroke in August of last year

## 2019-03-01 NOTE — Discharge Instructions (Addendum)
Your lab tests and CT scan of the head today were all okay.  Your examination was also reassuring.  Please follow-up with your doctor for continued monitoring of your symptoms.

## 2019-03-01 NOTE — ED Provider Notes (Signed)
[facility] Emergency Department Provider Note  ____________________________________________  Time seen: Approximately 7:06 PM  I have reviewed the triage vital signs and the nursing notes.   HISTORY  Chief Complaint Numbness    HPI [name] is a 63 y.o. female with a past medical history of diabetes, stroke, HIV, compliant with medications, who reports having intermittent tingling and pain of the right arm for the past 3 days.  No aggravating or alleviating factors.  No vision changes headaches weakness or paresthesia.  No change in balance or coordination, no falls.  Symptoms are brief, lasting a few seconds at a time.  She has been compliant with her medications including antiretrovirals.  No recent illness falls or trauma.      Past Medical History:  Diagnosis Date  . Arthritis    fingers  . Asthma   . Diabetes mellitus without complication ([place]) 9-60-45   hx. NIDDM-dx. 3 weeks ago  . Headache    migraines - none since stroke  . HIV (human immunodeficiency virus infection) ([place])   . Hyperlipidemia    hx  . Seasonal allergies   . Stroke ([place]) 02/18/2018   Residual Left side weakness and speech issues  . Ulcerative colitis ([place])   . Wears contact lenses   . Wears partial dentures    upper     Patient Active Problem List   Diagnosis Date Noted  . Migraine 02/23/2018  . HIV disease ([place])   . Stroke (cerebrum) ([place]) - R basal ganglia d/t small vessel dz 02/18/2018  . HLD (hyperlipidemia) 08/02/2014  . Diabetes mellitus type 2, uncontrolled, with complications ([place]) 40/98/1191  . Ulcerative colitis ([place]) 06/26/2008  . Depression, recurrent ([place]) 04/04/2008     Past Surgical History:  Procedure Laterality Date  . BUNIONECTOMY     bilateral and toe nails of big toes removed  . CESAREAN SECTION  1997/2006  . CHOLECYSTECTOMY N/A 12/01/2012   Procedure: LAPAROSCOPIC CHOLECYSTECTOMY;  Surgeon: Stark Klein, MD;  Location: [facility];   Service: General;  Laterality: N/A;  . COLONOSCOPY W/ POLYPECTOMY    . EYE SURGERY Bilateral 2013  . MULTIPLE TOOTH EXTRACTIONS    . RADIOLOGY WITH ANESTHESIA Left 09/09/2018   Procedure: MRI WITH ANESTHESIA LEFT SHOULDER WITH CONTRAST;  Surgeon: Radiologist, Medication, MD;  Location: [facility];  Service: Radiology;  Laterality: Left;  . SHOULDER ARTHROSCOPY WITH ROTATOR CUFF REPAIR AND SUBACROMIAL DECOMPRESSION Left 11/30/2018   Procedure: SHOULDER ARTHROSCOPY WITH SUBACROMIAL DECOMPRESSION AND DISTAL CLAVICAL EXCISION;  Surgeon: Thornton Park, MD;  Location: [facility];  Service: Orthopedics;  Laterality: Left;  Diabetic - oral meds  . TONSILLECTOMY    . TUBAL LIGATION       Prior to Admission medications   Medication Sig Start Date End Date Taking? Authorizing Provider  aspirin EC 81 MG EC tablet Take 1 tablet (81 mg total) by mouth daily. 02/24/18   Donzetta Starch, NP  atorvastatin (LIPITOR) 40 MG tablet TAKE 1 TABLET DAILY AT 6 P.M. 01/28/19   Jearld Fenton, NP  bictegravir-emtricitabine-tenofovir AF (BIKTARVY) 50-200-25 MG TABS tablet Take 1 tablet by mouth daily. 10/14/18   [place] Callas, NP  cetirizine (ZYRTEC) 10 MG tablet Take 10 mg by mouth daily.    [provider]  fluticasone (FLONASE) 50 MCG/ACT nasal spray Place 1 spray into both nostrils daily as needed for allergies or rhinitis.    [provider]  glipiZIDE (GLUCOTROL) 10 MG tablet TAKE 1 TABLET TWICE A DAY  BEFORE MEALS 01/28/19   Jearld Fenton, NP  glucose 4 GM chewable tablet Chew 1 tablet (4 g total) by mouth as needed for low blood sugar. 05/07/18   Ria Bush, MD  glucose blood (TRUE METRIX BLOOD GLUCOSE TEST) test strip 1 each by Other route 2 (two) times daily. Use as instructed 07/06/18   Jearld Fenton, NP  KLOR-CON M20 20 MEQ tablet TAKE 1 TABLET DAILY 01/28/19   Jearld Fenton, NP  mirtazapine (REMERON) 15 MG tablet Take 1 tablet (15 mg total) by mouth at bedtime. 02/14/19    Jearld Fenton, NP  ondansetron (ZOFRAN) 4 MG tablet Take 1 tablet (4 mg total) by mouth every 8 (eight) hours as needed for nausea or vomiting. 11/30/18   Thornton Park, MD  topiramate (TOPAMAX) 100 MG tablet TAKE 1 TABLET AT BEDTIME AS NEEDED 01/28/19   Jearld Fenton, NP  Vitamin D, Ergocalciferol, (DRISDOL) 1.25 MG (50000 UT) CAPS capsule Take 1 capsule (50,000 Units total) by mouth every 7 (seven) days. 02/15/19   Jearld Fenton, NP     Allergies Azithromycin, Erythromycin, Penicillins, and Tetracycline   Family History  Problem Relation Age of Onset  . Hypertension Mother   . Diabetes Mother   . Cancer Father        lung ca  . Seizures Daughter        61yr    Social History Social History   Tobacco Use  . Smoking status: Never Smoker  . Smokeless tobacco: Never Used  Substance Use Topics  . Alcohol use: Not Currently    Alcohol/week: 0.0 standard drinks  . Drug use: No    Review of Systems  Constitutional:   No fever or chills.  ENT:   No sore throat. No rhinorrhea. Cardiovascular:   No chest pain or syncope. Respiratory:   No dyspnea or cough. Gastrointestinal:   Negative for abdominal pain, vomiting and diarrhea.  Musculoskeletal: Right arm pain as above All other systems reviewed and are negative except as documented above in ROS and HPI.  ____________________________________________   PHYSICAL EXAM:  VITAL SIGNS: ED Triage Vitals  Enc Vitals Group     BP 03/01/19 1724 (!) 142/79     Pulse Rate 03/01/19 1724 91     Resp 03/01/19 1724 16     Temp 03/01/19 1724 98.4 F (36.9 C)     Temp Source 03/01/19 1724 Oral     SpO2 03/01/19 1724 100 %     Weight --      Height --      Head Circumference --      Peak Flow --      Pain Score 03/01/19 1721 0     Pain Loc --      Pain Edu? --      Excl. in [place] --     Vital signs reviewed, nursing assessments reviewed.   Constitutional:   Alert and oriented. Non-toxic appearance. Eyes:   Conjunctivae  are normal. EOMI. PERRL. ENT      Head:   Normocephalic and atraumatic.      Nose:   No congestion/rhinnorhea.       Mouth/Throat:   MMM, no pharyngeal erythema. No peritonsillar mass.       Neck:   No meningismus. Full ROM.  Spurling test positive on the right reproducing symptoms Hematological/Lymphatic/Immunilogical:   No cervical lymphadenopathy. Cardiovascular:   RRR. Symmetric bilateral radial and DP pulses.  No murmurs. Cap refill less  than 2 seconds. Respiratory:   Normal respiratory effort without tachypnea/retractions. Breath sounds are clear and equal bilaterally. No wheezes/rales/rhonchi. Gastrointestinal:   Soft and nontender. Non distended. There is no CVA tenderness.  No rebound, rigidity, or guarding.  Musculoskeletal:   Normal range of motion in all extremities. No joint effusions.  No lower extremity tenderness.  No edema. Neurologic:   Normal speech and language.  Cranial nerves III through XII intact Normal gait, pronator drift, finger-to-nose Motor grossly intact. Sensation intact and symmetric bilaterally in all 4 extremities NIH stroke scale 0 No acute focal neurologic deficits are appreciated.  Skin:    Skin is warm, dry and intact. No rash noted.  No petechiae, purpura, or bullae.  ____________________________________________    LABS (pertinent positives/negatives) (all labs ordered are listed, but only abnormal results are displayed) Labs Reviewed  CBC - Abnormal; Notable for the following components:      Result Value   RBC 3.65 (*)    Hemoglobin 11.5 (*)    HCT 34.4 (*)    All other components within normal limits  COMPREHENSIVE METABOLIC PANEL - Abnormal; Notable for the following components:   Potassium 3.4 (*)    Glucose, Bld 172 (*)    Calcium 8.6 (*)    AST 82 (*)    ALT 106 (*)    All other components within normal limits  PROTIME-INR  APTT  DIFFERENTIAL  I-STAT CREATININE, ED  CBG MONITORING, ED    ____________________________________________   EKG  Interpreted by me Normal sinus rhythm rate of 88, normal axis intervals QRS and ST segments.  Isolated T wave inversion lead III which is nonspecific  ____________________________________________    RADIOLOGY  Ct Head Wo Contrast  Result Date: 03/01/2019 CLINICAL DATA:  Numbness and tingling. Possible stroke. EXAM: CT HEAD WITHOUT CONTRAST TECHNIQUE: Contiguous axial images were obtained from the base of the skull through the vertex without intravenous contrast. COMPARISON:  February 17, 2018 FINDINGS: Brain: No evidence of acute infarction, hemorrhage, hydrocephalus, extra-axial collection or mass lesion/mass effect. Chronic small right basal ganglia infarct. Vascular: No hyperdense vessel or unexpected calcification. Skull: Normal. Negative for fracture or focal lesion. Sinuses/Orbits: No acute finding. Other: None. IMPRESSION: No acute intracranial abnormality. Electronically Signed   By: Fidela Salisbury M.D.   On: 03/01/2019 17:51    ____________________________________________   PROCEDURES Procedures  ____________________________________________  DIFFERENTIAL DIAGNOSIS   Intracranial hemorrhage, stroke, cervical radiculopathy  CLINICAL IMPRESSION / ASSESSMENT AND PLAN / ED COURSE  Medications ordered in the ED: Medications - No data to display  Pertinent labs & imaging results that were available during my care of the patient were reviewed by me and considered in my medical decision making (see chart for details).  [name] was evaluated in Emergency Department on 03/01/2019 for the symptoms described in the history of present illness. She was evaluated in the context of the global COVID-19 pandemic, which necessitated consideration that the patient might be at risk for infection with the SARS-CoV-2 virus that causes COVID-19. Institutional protocols and algorithms that pertain to the evaluation of patients at  risk for COVID-19 are in a state of rapid change based on information released by regulatory bodies including the CDC and federal and state organizations. These policies and algorithms were followed during the patient's care in the ED.   Patient presents with right arm pain and tingling for the past few days, intermittent.  Exam is reassuring, neurologically intact.  She also reported to triage that she had  some facial tingling as well but she denied this to me.  Labs and CT scan are unremarkable.  Exam is actually consistent with cervical radiculopathy.  Recommend follow-up with primary care for physical therapy referral.   Considering the patient's symptoms, medical history, and physical examination today, I have low suspicion for ischemic stroke, intracranial hemorrhage, meningitis, encephalitis, carotid or vertebral dissection, venous sinus thrombosis, MS, intracranial hypertension, glaucoma, CRAO, CRVO, or temporal arteritis.  I have low suspicion for ACS, PE, TAD, pneumothorax, carditis, mediastinitis, pneumonia, CHF, or sepsis.        ____________________________________________   FINAL CLINICAL IMPRESSION(S) / ED DIAGNOSES    Final diagnoses:  Cervical radiculopathy  Right arm pain     ED Discharge Orders    None      Portions of this note were generated with dragon dictation software. Dictation errors may occur despite best attempts at proofreading.   Carrie Mew, MD 03/01/19 1911

## 2019-03-01 NOTE — Telephone Encounter (Signed)
For 1 wk pt has daily tingling and numbness in entire rt arm; pt said no H/A,CP,SOB, dizziness and pt can grasp cup and use hand but has the tingling in hands and fingers as well as entire arm. Pt has hx of stroke; pt will have family take her to UC today. No available appts at [facility]. FYI to Avie Echevaria NP.

## 2019-03-01 NOTE — ED Triage Notes (Addendum)
PT c/o intermit RT face and arm tingling xfew days. Denies any headache or weakness. Speech clear, no facial droop noted.

## 2019-03-02 NOTE — ED Provider Notes (Signed)
[place], [place]   Name: [name] DOB: May 01, 1956 MRN: [id number] CSN: [id number] PCP: Jearld Fenton, NP  Arrival date and time:  03/01/19 1617  Chief Complaint:  Numbness   NOTE: Prior to seeing the patient today, I have reviewed the triage nursing documentation and vital signs. Clinical staff has updated patient's PMH/PSHx, current medication list, and drug allergies/intolerances to ensure comprehensive history available to assist in medical decision making.   History:   HPI: [name] is a 63 y.o. female who presents today with complaints of numbness and tingling to the RIGHT side of her face and in her RIGHT upper extremity. Symptoms began yesterday. Patient denies that she has experienced any headaches, dizziness, focal extremity weakness, or changes to her vision, speech, or overall mentation. She denies chest pain, shortness of breath, and palpitations. PMH (+) for CVA resulting in LEFT sided deficits on 02/18/2018. Patient states, "this feels kind of different. I was really really dizzy when I had my stroke".   Past Medical History:  Diagnosis Date  . Arthritis    fingers  . Asthma   . Diabetes mellitus without complication ([place]) 1-93-79   hx. NIDDM-dx. 3 weeks ago  . Headache    migraines - none since stroke  . HIV (human immunodeficiency virus infection) ([place])   . Hyperlipidemia    hx  . Seasonal allergies   . Stroke ([place]) 02/18/2018   Residual Left side weakness and speech issues  . Ulcerative colitis ([place])   . Wears contact lenses   . Wears partial dentures    upper    Past Surgical History:  Procedure Laterality Date  . BUNIONECTOMY     bilateral and toe nails of big toes removed  . CESAREAN SECTION  1997/2006  . CHOLECYSTECTOMY N/A 12/01/2012   Procedure: LAPAROSCOPIC CHOLECYSTECTOMY;  Surgeon: Stark Klein, MD;  Location: [facility];  Service: General;  Laterality: N/A;  . COLONOSCOPY W/ POLYPECTOMY    . EYE SURGERY Bilateral 2013  . MULTIPLE  TOOTH EXTRACTIONS    . RADIOLOGY WITH ANESTHESIA Left 09/09/2018   Procedure: MRI WITH ANESTHESIA LEFT SHOULDER WITH CONTRAST;  Surgeon: Radiologist, Medication, MD;  Location: [facility];  Service: Radiology;  Laterality: Left;  . SHOULDER ARTHROSCOPY WITH ROTATOR CUFF REPAIR AND SUBACROMIAL DECOMPRESSION Left 11/30/2018   Procedure: SHOULDER ARTHROSCOPY WITH SUBACROMIAL DECOMPRESSION AND DISTAL CLAVICAL EXCISION;  Surgeon: Thornton Park, MD;  Location: [facility];  Service: Orthopedics;  Laterality: Left;  Diabetic - oral meds  . TONSILLECTOMY    . TUBAL LIGATION      Family History  Problem Relation Age of Onset  . Hypertension Mother   . Diabetes Mother   . Cancer Father        lung ca  . Seizures Daughter        66yr    Social History   Tobacco Use  . Smoking status: Never Smoker  . Smokeless tobacco: Never Used  Substance Use Topics  . Alcohol use: Not Currently    Alcohol/week: 0.0 standard drinks  . Drug use: No    Patient Active Problem List   Diagnosis Date Noted  . Migraine 02/23/2018  . HIV disease ([facility]   . Stroke (cerebrum) ([place] R basal ganglia d/t small vessel dz 02/18/2018  . HLD (hyperlipidemia) 08/02/2014  . Diabetes mellitus type 2, uncontrolled, with complications ([facility] [id number] . Ulcerative colitis ([facility] 06/26/2008  . Depression, recurrent ([facility] 04/04/2008    Home Medications:    Current Meds  Medication Sig  . aspirin EC 81 MG EC tablet Take 1 tablet (81 mg total) by mouth daily.  Marland Kitchen atorvastatin (LIPITOR) 40 MG tablet TAKE 1 TABLET DAILY AT 6 P.M.  . bictegravir-emtricitabine-tenofovir AF (BIKTARVY) 50-200-25 MG TABS tablet Take 1 tablet by mouth daily.  . cetirizine (ZYRTEC) 10 MG tablet Take 10 mg by mouth daily.  . fluticasone (FLONASE) 50 MCG/ACT nasal spray Place 1 spray into both nostrils daily as needed for allergies or rhinitis.  Marland Kitchen glipiZIDE (GLUCOTROL) 10 MG tablet TAKE 1 TABLET TWICE A DAY BEFORE MEALS  . glucose 4 GM  chewable tablet Chew 1 tablet (4 g total) by mouth as needed for low blood sugar.  Marland Kitchen glucose blood (TRUE METRIX BLOOD GLUCOSE TEST) test strip 1 each by Other route 2 (two) times daily. Use as instructed  . KLOR-CON M20 20 MEQ tablet TAKE 1 TABLET DAILY  . mirtazapine (REMERON) 15 MG tablet Take 1 tablet (15 mg total) by mouth at bedtime.  . ondansetron (ZOFRAN) 4 MG tablet Take 1 tablet (4 mg total) by mouth every 8 (eight) hours as needed for nausea or vomiting.  . topiramate (TOPAMAX) 100 MG tablet TAKE 1 TABLET AT BEDTIME AS NEEDED    Allergies:   Azithromycin, Erythromycin, Penicillins, and Tetracycline  Review of Systems (ROS): Review of Systems  Constitutional: Negative for chills and fever.  Respiratory: Negative for cough and shortness of breath.   Cardiovascular: Negative for chest pain and palpitations.  Gastrointestinal: Negative for diarrhea, nausea and vomiting.  Musculoskeletal: Negative for back pain, neck pain and neck stiffness.  Skin: Negative for color change, pallor and rash.  Neurological: Positive for weakness (LUE from previous CVA) and numbness (RIGHT face and arm). Negative for dizziness, facial asymmetry and headaches.  All other systems reviewed and are negative.    Vital Signs: Today's Vitals   03/01/19 1625 03/01/19 1628 03/01/19 1653  BP: (!) 145/75    Pulse: 93    Resp: 16    Temp: 98.7 F (37.1 C)    TempSrc: Oral    SpO2: 98%    Weight:  145 lb (65.8 kg)   Height:  5' 2"  (1.575 m)   PainSc:  0-No pain 0-No pain    Physical Exam: Physical Exam  Constitutional: She is oriented to person, place, and time and well-developed, well-nourished, and in no distress. No distress.  HENT:  Head: Normocephalic and atraumatic.  Nose: Nose normal.  Mouth/Throat: Oropharynx is clear and moist.  Eyes: Pupils are equal, round, and reactive to light. Conjunctivae and EOM are normal.  Neck: Normal range of motion. Neck supple. No tracheal deviation present.   Cardiovascular: Normal rate, regular rhythm, normal heart sounds and intact distal pulses. Exam reveals no gallop and no friction rub.  No murmur heard. Pulmonary/Chest: Effort normal and breath sounds normal. No respiratory distress. She has no wheezes. She has no rales.  Abdominal: Soft. Bowel sounds are normal. She exhibits no distension. There is no abdominal tenderness.  Musculoskeletal: Normal range of motion.     Right shoulder: She exhibits normal range of motion, no tenderness, no bony tenderness, no swelling, no deformity, no spasm, normal pulse and normal strength.  Lymphadenopathy:    She has no cervical adenopathy.  Neurological: She is alert and oriented to person, place, and time. She has normal motor skills, normal sensation, normal reflexes and intact cranial nerves. She is not agitated. She displays no tremor and normal speech. No cranial nerve deficit or sensory  deficit. She has a normal Cerebellar Exam, a normal Finger-Nose-Finger Test, a normal Heel to [employer] and a normal Romberg Test. She shows no pronator drift. Gait normal. GCS score is 15.  LUE with residual weakness from previous CVA. No facial droop or changes to level of orientation, vision, or speech. No headaches. Ambulatory without difficulties.   Skin: Skin is warm and dry. No rash noted. She is not diaphoretic.  Psychiatric: Mood, memory, affect and judgment normal.  Nursing note and vitals reviewed.   Urgent Care Treatments / Results:   LABS: PLEASE NOTE: all labs that were ordered this encounter are listed, however only abnormal results are displayed. Labs Reviewed - No data to display  EKG: -None  RADIOLOGY: -None  PROCEDURES: Procedures  MEDICATIONS RECEIVED THIS VISIT: Medications - No data to display  PERTINENT CLINICAL COURSE NOTES/UPDATES:   Initial Impression / Assessment and Plan / Urgent Care Course:  Pertinent labs & imaging results that were available during my care of the patient  were personally reviewed by me and considered in my medical decision making (see lab/imaging section of note for values and interpretations).  [name] is a 63 y.o. female who presents to [facility] today with complaints of facial and extremity numbness.   Patient is well appearing overall in clinic today. She does not appear to be in any acute distress. Presenting symptoms (see HPI) and exam as documented above. Overall exam reassuring. There are no focal neurological concerns; she is grossly NI.  Due to her presenting symptoms and PMH (+) for CVA, patient needs CT imaging of her head. Discussed low likelihood of CVA, however it could/should not be ruled out in the UC setting. Reassurance provided. Discussed need for further evaluation and head imaging in the emergency department setting. Patient in agree. Patient electing to present to the ED at [facility] for the aforementioned. Report called to charge nurse on duty to make them aware that patient would be presenting via POV driven by her husband.   Final Clinical Impressions / Urgent Care Diagnoses:   Final diagnoses:  Facial numbness  Right upper extremity numbness  History of CVA (cerebrovascular accident)    New Prescriptions:  [place] Controlled Substance Registry consulted? Not Applicable  No orders of the defined types were placed in this encounter.   Recommended Follow up Care:  Patient encouraged to follow up with the following provider within the specified time frame, or sooner as dictated by the severity of her symptoms. As always, she was instructed that for any urgent/emergent care needs, she should seek care either here or in the emergency department for more immediate evaluation.  Follow-up Information    Go to  [facility].   Specialty: Emergency Medicine Contact information: [place] [phone] [place] [phone]         NOTE: This note was prepared using Dragon dictation software along with smaller phrase technology. Despite my best ability to proofread, there is the potential that transcriptional errors may still occur from this process, and are completely unintentional.    Karen Kitchens, NP 03/02/19 1435

## 2019-03-08 ENCOUNTER — Other Ambulatory Visit: Payer: Self-pay | Admitting: Internal Medicine

## 2019-03-08 DIAGNOSIS — E1165 Type 2 diabetes mellitus with hyperglycemia: Secondary | ICD-10-CM

## 2019-03-09 ENCOUNTER — Encounter: Payer: Self-pay | Admitting: Internal Medicine

## 2019-03-09 ENCOUNTER — Other Ambulatory Visit: Payer: Self-pay

## 2019-03-09 ENCOUNTER — Ambulatory Visit (INDEPENDENT_AMBULATORY_CARE_PROVIDER_SITE_OTHER): Payer: BC Managed Care – PPO | Admitting: Internal Medicine

## 2019-03-09 VITALS — BP 142/78 | Temp 98.6°F | Wt 151.0 lb

## 2019-03-09 DIAGNOSIS — M5412 Radiculopathy, cervical region: Secondary | ICD-10-CM

## 2019-03-09 DIAGNOSIS — M5441 Lumbago with sciatica, right side: Secondary | ICD-10-CM | POA: Diagnosis not present

## 2019-03-09 MED ORDER — PREDNISONE 10 MG PO TABS: ORAL_TABLET | ORAL | 0 refills | Status: DC

## 2019-03-09 MED ORDER — METHOCARBAMOL 500 MG PO TABS: 500.0000 mg | ORAL_TABLET | Freq: Two times a day (BID) | ORAL | 0 refills | Status: DC | PRN

## 2019-03-09 NOTE — Progress Notes (Signed)
Subjective:    Patient ID: [name], female    DOB: 1956/03/03, 63 y.o.   MRN: [id number]  HPI  Pt presents to the clinic today with c/o right low back pain. This started 4 days ago. She describes the back pain as sore and achy. The pain radiates into the right leg. The pain is worse with movement and walking. She denies numbness, tingling or weakness of the right leg. She denies any issues with bowel or bladder. She denies any injury to the area. She has not taken anything OTC for this.   She [facility] to [facility]18 for right facial numbness, right arm pain. Given her previous history of stroke, they sent her to the ER. Labs, ECG and CT head were unremarkable. She was diagnosed with cervical radiculopathy and advised to follow up with her PCP and PT.   Review of Systems      Past Medical History:  Diagnosis Date  . Arthritis    fingers  . Asthma   . Diabetes mellitus without complication ([place]) 9-32-67   hx. NIDDM-dx. 3 weeks ago  . Headache    migraines - none since stroke  . HIV (human immunodeficiency virus infection) ([place])   . Hyperlipidemia    hx  . Seasonal allergies   . Stroke ([place]) 02/18/2018   Residual Left side weakness and speech issues  . Ulcerative colitis ([place])   . Wears contact lenses   . Wears partial dentures    upper    Current Outpatient Medications  Medication Sig Dispense Refill  . aspirin EC 81 MG EC tablet Take 1 tablet (81 mg total) by mouth daily.    Marland Kitchen atorvastatin (LIPITOR) 40 MG tablet TAKE 1 TABLET DAILY AT 6 P.M. 90 tablet 0  . bictegravir-emtricitabine-tenofovir AF (BIKTARVY) 50-200-25 MG TABS tablet Take 1 tablet by mouth daily. 90 tablet 2  . cetirizine (ZYRTEC) 10 MG tablet Take 10 mg by mouth daily.    . fluticasone (FLONASE) 50 MCG/ACT nasal spray Place 1 spray into both nostrils daily as needed for allergies or rhinitis.    Marland Kitchen FREESTYLE LITE test strip TEST BLOOD SUGAR TWICE DAILY AS DIRECTED 100 each 8  . glipiZIDE (GLUCOTROL) 10 MG  tablet TAKE 1 TABLET TWICE A DAY BEFORE MEALS 180 tablet 0  . glucose 4 GM chewable tablet Chew 1 tablet (4 g total) by mouth as needed for low blood sugar. 30 tablet 3  . KLOR-CON M20 20 MEQ tablet TAKE 1 TABLET DAILY 90 tablet 0  . mirtazapine (REMERON) 15 MG tablet Take 1 tablet (15 mg total) by mouth at bedtime. 90 tablet 3  . ondansetron (ZOFRAN) 4 MG tablet Take 1 tablet (4 mg total) by mouth every 8 (eight) hours as needed for nausea or vomiting. 30 tablet 0  . topiramate (TOPAMAX) 100 MG tablet TAKE 1 TABLET AT BEDTIME AS NEEDED 90 tablet 0  . Vitamin D, Ergocalciferol, (DRISDOL) 1.25 MG (50000 UT) CAPS capsule Take 1 capsule (50,000 Units total) by mouth every 7 (seven) days. 12 capsule 0   No current facility-administered medications for this visit.     Allergies  Allergen Reactions  . Azithromycin Itching    Muscle aches  . Erythromycin Nausea And Vomiting  . Penicillins Nausea And Vomiting    Did it involve swelling of the face/tongue/throat, SOB, or low BP? No Did it involve sudden or severe rash/hives, skin peeling, or any reaction on the inside of your mouth or nose? No Did you  need to seek medical attention at a hospital or doctor's office? No When did it last happen?childhood allergy If all above answers are "NO", may proceed with cephalosporin use.   . Tetracycline Nausea And Vomiting    Family History  Problem Relation Age of Onset  . Hypertension Mother   . Diabetes Mother   . Cancer Father        lung ca  . Seizures Daughter        72yr    Social History   Socioeconomic History  . Marital status: Legally Separated    Spouse name: Not on file  . Number of children: Not on file  . Years of education: Not on file  . Highest education level: Not on file  Occupational History  . Not on file  Social Needs  . Financial resource strain: Not on file  . Food insecurity    Worry: Not on file    Inability: Not on file  . Transportation needs     Medical: Not on file    Non-medical: Not on file  Tobacco Use  . Smoking status: Never Smoker  . Smokeless tobacco: Never Used  Substance and Sexual Activity  . Alcohol use: Not Currently    Alcohol/week: 0.0 standard drinks  . Drug use: No  . Sexual activity: Not Currently    Partners: Male    Comment: offered condoms  Lifestyle  . Physical activity    Days per week: Not on file    Minutes per session: Not on file  . Stress: Not on file  Relationships  . Social [place] phone: Not on file    Gets together: Not on file    Attends religious service: Not on file    Active member of club or organization: Not on file    Attends meetings of clubs or organizations: Not on file    Relationship status: Not on file  . Intimate partner violence    Fear of current or ex partner: Not on file    Emotionally abused: Not on file    Physically abused: Not on file    Forced sexual activity: Not on file  Other Topics Concern  . Not on file  Social History Narrative  . Not on file     Constitutional: Denies fever, malaise, fatigue, headache or abrupt weight changes.  Respiratory: Denies difficulty breathing, shortness of breath, cough or sputum production.   Cardiovascular: Denies chest pain, chest tightness, palpitations or swelling in the hands or feet.  Musculoskeletal: Pt reports right low back pain, right leg pain. Denies decrease in range of motion, difficulty with gait, or joint swelling.  Skin: Denies redness, rashes, lesions or ulcercations.  Neurological: Pt reports tingling in right arm. Denies dizziness, difficulty with memory, difficulty with speech or problems with coordination.    No other specific complaints in a complete review of systems (except as listed in HPI above).  Objective:   Physical Exam  BP (!) 142/78   Temp 98.6 F (37 C) (Temporal)   Wt 151 lb (68.5 kg)   BMI 27.62 kg/m  Wt Readings from Last 3 Encounters:  03/09/19 151 lb (68.5 kg)   03/01/19 145 lb (65.8 kg)  02/14/19 145 lb (65.8 kg)    General: Appears her stated age, well developed, well nourished in NAD. Cardiovascular: Normal rate and rhythm. Pulmonary/Chest: Normal effort and positive vesicular breath sounds. No respiratory distress. No wheezes, rales or ronchi noted.  Musculoskeletal:  Normal flexion, extension and rotation of the cervical spine. No bony tenderness noted over the cervical spine. Strength 5/5 RUE, 4/5 LUE. Pain with flexion and extension of the lumbar spine. Normal rotation. Bony tenderness noted over the lumbar spine. Strength 5/5 RLE, 4/5 LLE. Able to walk on toes and heels.  Neurological: Alert and oriented.     BMET    Component Value Date/Time   NA 138 03/01/2019 1730   NA 138 06/23/2014 0503   K 3.4 (L) 03/01/2019 1730   K 4.2 06/23/2014 0503   CL 106 03/01/2019 1730   CL 104 06/23/2014 0503   CO2 27 03/01/2019 1730   CO2 27 06/23/2014 0503   GLUCOSE 172 (H) 03/01/2019 1730   GLUCOSE 246 (H) 06/23/2014 0503   BUN 13 03/01/2019 1730   BUN 17 06/23/2014 0503   CREATININE 0.85 03/01/2019 1730   CREATININE 0.94 09/27/2018 1053   CALCIUM 8.6 (L) 03/01/2019 1730   CALCIUM 8.7 06/23/2014 0503   GFRNONAA >60 03/01/2019 1730   GFRNONAA 65 09/27/2018 1053   GFRAA >60 03/01/2019 1730   GFRAA 76 09/27/2018 1053    Lipid Panel     Component Value Date/Time   CHOL 131 02/14/2019 1043   CHOL 211 (H) 06/22/2014 1432   TRIG 101.0 02/14/2019 1043   TRIG 473 (H) 06/22/2014 1432   HDL 47.10 02/14/2019 1043   HDL 42 06/22/2014 1432   CHOLHDL 3 02/14/2019 1043   VLDL 20.2 02/14/2019 1043   VLDL SEE COMMENT 06/22/2014 1432   LDLCALC 63 02/14/2019 1043   LDLCALC SEE COMMENT 06/22/2014 1432    CBC    Component Value Date/Time   WBC 5.7 03/01/2019 1730   RBC 3.65 (L) 03/01/2019 1730   HGB 11.5 (L) 03/01/2019 1730   HGB 12.2 06/23/2014 0503   HCT 34.4 (L) 03/01/2019 1730   HCT 37.2 06/23/2014 0503   PLT 201 03/01/2019 1730   PLT  251 06/23/2014 0503   MCV 94.2 03/01/2019 1730   MCV 89 06/23/2014 0503   MCH 31.5 03/01/2019 1730   MCHC 33.4 03/01/2019 1730   RDW 12.9 03/01/2019 1730   RDW 13.6 06/23/2014 0503   LYMPHSABS 2.3 03/01/2019 1730   LYMPHSABS 1.8 06/23/2014 0503   MONOABS 0.6 03/01/2019 1730   MONOABS 0.4 06/23/2014 0503   EOSABS 0.1 03/01/2019 1730   EOSABS 0.0 06/23/2014 0503   BASOSABS 0.0 03/01/2019 1730   BASOSABS 0.0 06/23/2014 0503    Hgb A1C Lab Results  Component Value Date   HGBA1C 6.6 (H) 02/14/2019            Assessment & Plan:   ER Follow Up for Cervical Radiculopathy:  ER notes, labs and imaging reviewed RX for Pred Taper x 6 days RX for Methocarbamol 500 mg BID prn- sedation caution given Neck exercises given  Acute Right Low Back Pain with Right Sided Sciatica:  Back exercises given Encouraged her to try ice for 10 minutes BID RX for Pred Taper x 6 days Rx for Methocarbamol 500 mg BID prn - sedation caution given  Return precautions discussed Webb Silversmith, NP

## 2019-03-09 NOTE — Patient Instructions (Signed)

## 2019-04-01 DIAGNOSIS — H16142 Punctate keratitis, left eye: Secondary | ICD-10-CM | POA: Diagnosis not present

## 2019-04-11 ENCOUNTER — Other Ambulatory Visit: Payer: Self-pay | Admitting: Internal Medicine

## 2019-04-11 DIAGNOSIS — E559 Vitamin D deficiency, unspecified: Secondary | ICD-10-CM

## 2019-04-18 ENCOUNTER — Other Ambulatory Visit: Payer: BC Managed Care – PPO

## 2019-04-18 ENCOUNTER — Other Ambulatory Visit: Payer: Self-pay

## 2019-04-18 DIAGNOSIS — Z113 Encounter for screening for infections with a predominantly sexual mode of transmission: Secondary | ICD-10-CM | POA: Diagnosis not present

## 2019-04-18 DIAGNOSIS — Z0000 Encounter for general adult medical examination without abnormal findings: Secondary | ICD-10-CM

## 2019-04-18 DIAGNOSIS — B20 Human immunodeficiency virus [HIV] disease: Secondary | ICD-10-CM

## 2019-04-19 LAB — HEPATITIS B SURFACE ANTIBODY,QUALITATIVE: Hep B S Ab: REACTIVE — AB

## 2019-04-19 LAB — T-HELPER CELL (CD4) - (RCID CLINIC ONLY)
CD4 % Helper T Cell: 47 % (ref 33–65)
CD4 T Cell Abs: 937 /uL (ref 400–1790)

## 2019-04-20 LAB — COMPREHENSIVE METABOLIC PANEL
AG Ratio: 1.7 (calc) (ref 1.0–2.5)
ALT: 22 U/L (ref 6–29)
AST: 19 U/L (ref 10–35)
Albumin: 4 g/dL (ref 3.6–5.1)
Alkaline phosphatase (APISO): 121 U/L (ref 37–153)
BUN: 14 mg/dL (ref 7–25)
CO2: 23 mmol/L (ref 20–32)
Calcium: 8.8 mg/dL (ref 8.6–10.4)
Chloride: 112 mmol/L — ABNORMAL HIGH (ref 98–110)
Creat: 0.9 mg/dL (ref 0.50–0.99)
Globulin: 2.3 g/dL (calc) (ref 1.9–3.7)
Glucose, Bld: 146 mg/dL — ABNORMAL HIGH (ref 65–99)
Potassium: 3.7 mmol/L (ref 3.5–5.3)
Sodium: 142 mmol/L (ref 135–146)
Total Bilirubin: 0.5 mg/dL (ref 0.2–1.2)
Total Protein: 6.3 g/dL (ref 6.1–8.1)

## 2019-04-20 LAB — CBC
HCT: 34.2 % — ABNORMAL LOW (ref 35.0–45.0)
Hemoglobin: 11.5 g/dL — ABNORMAL LOW (ref 11.7–15.5)
MCH: 30.7 pg (ref 27.0–33.0)
MCHC: 33.6 g/dL (ref 32.0–36.0)
MCV: 91.2 fL (ref 80.0–100.0)
MPV: 10.4 fL (ref 7.5–12.5)
Platelets: 217 10*3/uL (ref 140–400)
RBC: 3.75 10*6/uL — ABNORMAL LOW (ref 3.80–5.10)
RDW: 12.8 % (ref 11.0–15.0)
WBC: 4.4 10*3/uL (ref 3.8–10.8)

## 2019-04-20 LAB — RPR: RPR Ser Ql: NONREACTIVE

## 2019-04-20 LAB — HIV-1 RNA QUANT-NO REFLEX-BLD
HIV 1 RNA Quant: <20 copies/mL
HIV-1 RNA Quant, Log: <1.3 Log copies/mL

## 2019-04-28 ENCOUNTER — Other Ambulatory Visit: Payer: Self-pay | Admitting: Internal Medicine

## 2019-05-02 ENCOUNTER — Encounter: Payer: BC Managed Care – PPO | Admitting: Infectious Diseases

## 2019-05-02 ENCOUNTER — Other Ambulatory Visit: Payer: Self-pay

## 2019-05-02 ENCOUNTER — Ambulatory Visit (INDEPENDENT_AMBULATORY_CARE_PROVIDER_SITE_OTHER): Payer: BC Managed Care – PPO | Admitting: Infectious Diseases

## 2019-05-02 ENCOUNTER — Encounter: Payer: Self-pay | Admitting: Infectious Diseases

## 2019-05-02 VITALS — BP 135/79 | HR 93 | Wt 156.0 lb

## 2019-05-02 DIAGNOSIS — Z23 Encounter for immunization: Secondary | ICD-10-CM

## 2019-05-02 DIAGNOSIS — F339 Major depressive disorder, recurrent, unspecified: Secondary | ICD-10-CM | POA: Diagnosis not present

## 2019-05-02 DIAGNOSIS — Z0000 Encounter for general adult medical examination without abnormal findings: Secondary | ICD-10-CM | POA: Diagnosis not present

## 2019-05-02 DIAGNOSIS — B20 Human immunodeficiency virus [HIV] disease: Secondary | ICD-10-CM | POA: Diagnosis not present

## 2019-05-02 NOTE — Assessment & Plan Note (Signed)
Reinforced that we can offer counseling services at our office should she find that to be helpful. Antidepressant per primary team.

## 2019-05-02 NOTE — Assessment & Plan Note (Signed)
Flu and prevnar vaccines today - counseling provided.  She will need a pneumovax booster after 63 yo.  Pap smear is up to date and normal - would continue lifelong screenings beyond 63yo given HIV+ status at a 3 year interval or otherwise recommended.  Mammogram scheduled.  Continues to follow with PCP; appreciate care.

## 2019-05-02 NOTE — Assessment & Plan Note (Signed)
Continues to be under excellent control. She is taking her Biktarvy as prescribed and has no findings concerning on exam today for advancing infection. We discussed some tactics to reduce missed medication and she will look into a 2 week BID pill tray to help so she does not need to fill her tray as often; the visual helps her. I encouraged her to continue with her routine to take her medication prior to going downstairs. Reviewed her lab work in detail today and continued with HIV education. She understands how to interpret her lab work but needs some assistance; will obtain labs prior to follow up again to reinforce learning at the time of the visit.  STI screening up to date.  She has dental care.  Flu and pneumonia vaccines updated today.  She can return in 6 months.

## 2019-05-02 NOTE — Progress Notes (Signed)
Patient: [name]  DOB: [birth year] MRN: [id number] PCP: Jearld Fenton, NP    Patient Active Problem List   Diagnosis Date Noted  . Healthcare maintenance 04/01/2018  . Migraine 02/23/2018  . HIV disease ([place])   . Stroke (cerebrum) ([place]) - R basal ganglia d/t small vessel dz 02/18/2018  . HLD (hyperlipidemia) 08/02/2014  . Diabetes mellitus type 2, uncontrolled, with complications ([place]) 04/29/5101  . Ulcerative colitis ([place]) 06/26/2008  . Depression, recurrent ([place]) 04/04/2008     Subjective:  Brief ID:  [name] is a 63 y.o. female with HIV infection diagnosed August 2019 on routine screening after having had a stroke. VL 118,000 copies, CD4 500.  HIV Risk: heterosexual.  OI Hx: none  Previous Regimens:   Biktarvy 02-2018: suppressed   Resistance Testing:   02-2018 wildtype virus   CC:  Follow up HIV care. Feeling well, no complaints. Had shoulder surgery.    HPI/ROS:  [name] is doing very well. She has done a great job controlling her diabetes and her A1C is down < 7%. She has been taking her Biktarvy every day. There are days where her memory causes some trouble with remember if she took a dose already. Sometimes she worries she doubled her dose but she never comes up short for the month with her medication. She has attempted to create an early morning habit to take her Biktarvy before she goes downstairs to start her day for routine. She has no side effects to her medications and no trouble with access.   She is working with her PCP for her diabetes and other preventative care. Had a pap smear in August 2020 which was normal and has a mammogram coming up scheduled. She is married but has not had sexual activity in some time. She has been dealing with neck/shoulder pain still but overall improved.   Aside from her shoulder surgery she has been to Urgent Care for evaluation of her neck pain and numbness; she was referred to ER given her history of  previous strokes. Normal work up.    Review of Systems  Constitutional: Negative for chills, fever and malaise/fatigue.  HENT: Negative for tinnitus.   Eyes: Negative for blurred vision and photophobia.  Respiratory: Negative for cough and sputum production.   Cardiovascular: Negative for chest pain and palpitations.  Gastrointestinal: Negative for diarrhea, nausea and vomiting.  Genitourinary: Negative for dysuria.  Skin: Negative for rash.  Neurological: Negative for speech change, focal weakness (LUE weakness improved) and headaches.  Psychiatric/Behavioral: The patient is not nervous/anxious.     Past Medical History:  Diagnosis Date  . Arthritis    fingers  . Asthma   . Diabetes mellitus without complication ([place]) 5-85-27   hx. NIDDM-dx. 3 weeks ago  . Headache    migraines - none since stroke  . HIV (human immunodeficiency virus infection) ([place])   . Hyperlipidemia    hx  . Seasonal allergies   . Stroke ([place]) 02/18/2018   Residual Left side weakness and speech issues  . Ulcerative colitis ([place])   . Wears contact lenses   . Wears partial dentures    upper    Outpatient Medications Prior to Visit  Medication Sig Dispense Refill  . aspirin EC 81 MG EC tablet Take 1 tablet (81 mg total) by mouth daily.    Marland Kitchen atorvastatin (LIPITOR) 40 MG tablet TAKE 1 TABLET DAILY AT 6 P.M. 90 tablet 1  . bictegravir-emtricitabine-tenofovir AF (BIKTARVY) 50-200-25 MG TABS  tablet Take 1 tablet by mouth daily. 90 tablet 2  . cetirizine (ZYRTEC) 10 MG tablet Take 10 mg by mouth daily.    . fluticasone (FLONASE) 50 MCG/ACT nasal spray Place 1 spray into both nostrils daily as needed for allergies or rhinitis.    Marland Kitchen FREESTYLE LITE test strip TEST BLOOD SUGAR TWICE DAILY AS DIRECTED 100 each 8  . glipiZIDE (GLUCOTROL) 10 MG tablet TAKE 1 TABLET TWICE A DAY BEFORE MEALS 180 tablet 1  . glucose 4 GM chewable tablet Chew 1 tablet (4 g total) by mouth as needed for low blood sugar. 30 tablet 3  .  KLOR-CON M20 20 MEQ tablet TAKE 1 TABLET DAILY 90 tablet 1  . methocarbamol (ROBAXIN) 500 MG tablet Take 1 tablet (500 mg total) by mouth 2 (two) times daily as needed for muscle spasms. 20 tablet 0  . mirtazapine (REMERON) 15 MG tablet Take 1 tablet (15 mg total) by mouth at bedtime. 90 tablet 3  . ondansetron (ZOFRAN) 4 MG tablet Take 1 tablet (4 mg total) by mouth every 8 (eight) hours as needed for nausea or vomiting. 30 tablet 0  . predniSONE (DELTASONE) 10 MG tablet Take 3 tabs on days 1-3, take 2 tabs on days 4-6, take 1 tab on days 7-9 18 tablet 0  . topiramate (TOPAMAX) 100 MG tablet TAKE 1 TABLET AT BEDTIME AS NEEDED 90 tablet 1  . Vitamin D, Ergocalciferol, (DRISDOL) 1.25 MG (50000 UT) CAPS capsule Take 1 capsule (50,000 Units total) by mouth every 7 (seven) days. 12 capsule 0   No facility-administered medications prior to visit.      Allergies  Allergen Reactions  . Azithromycin Itching    Muscle aches  . Erythromycin Nausea And Vomiting  . Penicillins Nausea And Vomiting    Did it involve swelling of the face/tongue/throat, SOB, or low BP? No Did it involve sudden or severe rash/hives, skin peeling, or any reaction on the inside of your mouth or nose? No Did you need to seek medical attention at a hospital or doctor's office? No When did it last happen?childhood allergy If all above answers are "NO", may proceed with cephalosporin use.   . Tetracycline Nausea And Vomiting    Social History   Tobacco Use  . Smoking status: Never Smoker  . Smokeless tobacco: Never Used  Substance Use Topics  . Alcohol use: Not Currently    Alcohol/week: 0.0 standard drinks  . Drug use: No    Objective:   Vitals:   05/02/19 1548  BP: 135/79  Pulse: 93  Weight: 156 lb (70.8 kg)   Body mass index is 28.53 kg/m.  Physical Exam Constitutional:      Appearance: Normal appearance. She is well-developed.     Comments: Seated comfortably in chair.   HENT:      Mouth/Throat:     Mouth: No oral lesions.     Dentition: Normal dentition. No dental abscesses.     Pharynx: No oropharyngeal exudate.  Cardiovascular:     Rate and Rhythm: Normal rate and regular rhythm.     Heart sounds: Normal heart sounds.  Pulmonary:     Effort: Pulmonary effort is normal.     Breath sounds: Normal breath sounds.  Abdominal:     General: There is no distension.     Palpations: Abdomen is soft.     Tenderness: There is no abdominal tenderness.  Lymphadenopathy:     Cervical: No cervical adenopathy.  Skin:  General: Skin is warm and dry.     Findings: No rash.  Neurological:     Mental Status: She is alert and oriented to person, place, and time.  Psychiatric:        Judgment: Judgment normal.     Comments: In good spirits today and engaged in care discussion     Lab Results: HIV 1 RNA Quant (copies/mL)  Date Value  04/18/2019 <20 NOT DETECTED  09/27/2018 <20 NOT DETECTED  06/01/2018 <20 NOT DETECTED   CD4 T Cell Abs (/uL)  Date Value  04/18/2019 937  09/27/2018 770  02/22/2018 500    Lab Results  Component Value Date   WBC 4.4 04/18/2019   HGB 11.5 (L) 04/18/2019   HCT 34.2 (L) 04/18/2019   MCV 91.2 04/18/2019   PLT 217 04/18/2019    Lab Results  Component Value Date   CREATININE 0.90 04/18/2019   BUN 14 04/18/2019   NA 142 04/18/2019   K 3.7 04/18/2019   CL 112 (H) 04/18/2019   CO2 23 04/18/2019    Lab Results  Component Value Date   ALT 22 04/18/2019   AST 19 04/18/2019   ALKPHOS 123 03/01/2019   BILITOT 0.5 04/18/2019     Assessment & Plan:   Problem List Items Addressed This Visit      Unprioritized   HIV disease ([place]) (Chronic)    Continues to be under excellent control. She is taking her Biktarvy as prescribed and has no findings concerning on exam today for advancing infection. We discussed some tactics to reduce missed medication and she will look into a 2 week BID pill tray to help so she does not need to fill her  tray as often; the visual helps her. I encouraged her to continue with her routine to take her medication prior to going downstairs. Reviewed her lab work in detail today and continued with HIV education. She understands how to interpret her lab work but needs some assistance; will obtain labs prior to follow up again to reinforce learning at the time of the visit.  STI screening up to date.  She has dental care.  Flu and pneumonia vaccines updated today.  She can return in 6 months.       Depression, recurrent ([place])    Reinforced that we can offer counseling services at our office should she find that to be helpful. Antidepressant per primary team.       Healthcare maintenance - Primary    Flu and prevnar vaccines today - counseling provided.  She will need a pneumovax booster after 63 yo.  Pap smear is up to date and normal - would continue lifelong screenings beyond 63yo given HIV+ status at a 3 year interval or otherwise recommended.  Mammogram scheduled.  Continues to follow with PCP; appreciate care.       Relevant Orders   Pneumococcal conjugate vaccine 13-valent IM (Completed)    Other Visit Diagnoses    Need for immunization against influenza       Relevant Orders   Flu Vaccine QUAD 36+ mos IM (Completed)     Return in about 6 months (around 10/31/2019) for follow up, labs.   Janene Madeira, MSN, NP-C [facility] [place] Pager: [phone] Office: [phone]  05/02/19  9:37 PM

## 2019-05-02 NOTE — Patient Instructions (Signed)
Nice to see you today!  Your labs look perfect.   Please continue taking your biktarvy every day like you are now.   Vaccines given today:  1. Flu shot 2. Pneumonia booster  Please come back in 6 months with your labs 2 weeks before your visit.

## 2019-05-03 ENCOUNTER — Ambulatory Visit: Admitting: Adult Health

## 2019-05-03 NOTE — Progress Notes (Deleted)
[facility] [address] [place]. [place] [address] [phone]       OFFICE FOLLOW UP NOTE  [name] Date of Birth:  11-26-1955 Medical Record Number:  [id number]   Reason for Referral:  hospital stroke follow up  CHIEF COMPLAINT:  No chief complaint on file.   HPI:  Update 05/03/19: [name] is being seen today for stroke follow-up.  Residual deficits ***.  Continues on aspirin and atorvastatin for secondary stroke prevention without side effects.  Blood pressure today ***.  Glucose levels ***.   Virtual visit 10/27/2018: She has been stable from a stroke standpoint with residual deficits of left hemiparesis without much improvement. Therapy on hold at this time due to [place]. Speech "okay at times" but feels as though it could be better. During conversation, frequent hesitant speech and pausing.  She continues to have left shoulder pain and was scheduled to undergo shoulder arthroscopy with distal clavicle excision but this was placed on hold due to COVID-19 safety precautions.  She does endorse difficulty with therapy and making progress due to shoulder pain.  She endorses difficulty performing daily functions or activities due to residual left arm weakness and limited range of motion due to pain.  She is interested in restarting therapy after procedure.  She has continued on aspirin 81 mg without side effects of bleeding or bruising.  Continues on atorvastatin without side effects myalgias.  She does not routinely monitor BP at home.  Glucose levels have been stable recent A1c 6.0.   Interval history 07/27/2018: Patient is being seen today for follow-up visit.  She continues to have left arm pain with trial of injections but per notes, no relief.  CT wrist showed scapholunate dissociation. She states that she will have MRI completed today and then Friday will have follow up appointment. She states pain continues all through hand up to shoulder. Does  endorse difficulty sleeping at night due to continued pain. She was prescribed remeron by PCP which has been helping her sleep but does continue to wake up occassionally due to pain.  Patient is frustrated by this continued pain as she has been having studies done but has not been receiving results per patient along with being told that different treatment plans should be done such as undergoing surgery versus not undergoing surgery.  She does endorse having NCV with EMG performed and was told that she has no indication of nerve damage.  She is fearful of ending up with frozen shoulder as a result of limited ROM in right arm along with difficulty participating in therapies for continued weakness.  She does continue to have left-sided facial paralysis with slurred speech and occasional mental slowing but otherwise has been recovering well from a stroke standpoint.  She continues on aspirin 81 mg without side effects of bleeding or bruising.  Continues on atorvastatin without side effects myalgias.  Blood pressure today satisfactory 116/69.  No further concerns at this time.  Denies new or worsening stroke/TIA symptoms.    Initial visit 04/06/2018: Patient is being seen today for hospital follow-up.  She continues to have left hemiparesis and dysarthria but has been improving.  She has completed PT/ST and is currently just receiving OT at this time.  She has completed 3 weeks of DAPT but has continued to take aspirin 81 mg along with Plavix 75 mg . Denies side effects of bleeding or bruising spoke to patient regarding discontinuation of Plavix at this time and continuation of aspirin  81 mg daily.  Continues to take Lipitor 40 mg.  She does endorse left shoulder/arm pain which has been present since a stroke and is unable to determine if this could be due to statin myalgias or left hemiparesis.  Blood pressure today satisfactory 125/82.  Does monitor glucose levels at home and this morning's level 84.  She takes  Topamax 50 mg at night consistently where prior to admission, she was taking it as needed.  She denies any recent headaches.  Denies new or worsening stroke/TIA symptoms.   Hospital admission 02/18/2018: ShannonShannon Hemingwayis a 63 y.o.femalewith history of DM, HLD, ulcerative colitis who was admitted to [facility] hospital for left facial droop, slurred speech and left hemiparesis. Patient was evaluated by telemetry neurology and after negative CT had obtained,  IV tPA was administered without complication and was transferred to [facility] for further evaluation and management.  MRI brain reviewed and showed right small BG infarct.  CTA head and neck unremarkable.  2D echo showed an EF of 55 to 60%.  Determined thatinfarct was due tosmall vessel disease.Does have a history of vascular risk factors of HTN, HLD and DB.  Patient was also found to have HIV during stroke workup. ID was consulted and was started on biktarvy (per notes, patient requested to not mention this other family members). LDL 136 and his patient was on statin PTA recommended Lipitor 40 mg at discharge.  HTN stable during admission and recommended long-term BP goal normotensive range.  A1c 9.1 and per notes, patient had stopped all home diabetic medications due to hypoglycemia which was confirmed during hospital admission with BG 66.  Recommended continued PCP follow-up for DM management.  Patient was not on antithrombotic PTA and recommended DAPT for 3 weeks and then aspirin alone.  Also endorses history of migraine and did increase Topamax dosage during hospital admission with possible consideration of following up with Dr. Jaynee Eagles as outpatient for continued management.  Patient was discharged to [facility] for continued PT/OT/ST.     ROS:   14 system review of systems performed and negative with exception of joint pain, joint swelling, aching muscles and muscle cramps  PMH:  Past Medical History:  Diagnosis Date   Arthritis     fingers   Asthma    Diabetes mellitus without complication ([place]) 6-43-32   hx. NIDDM-dx. 3 weeks ago   Headache    migraines - none since stroke   HIV (human immunodeficiency virus infection) ([place])    Hyperlipidemia    hx   Seasonal allergies    Stroke ([place]) 02/18/2018   Residual Left side weakness and speech issues   Ulcerative colitis ([place])    Wears contact lenses    Wears partial dentures    upper    PSH:  Past Surgical History:  Procedure Laterality Date   BUNIONECTOMY     bilateral and toe nails of big toes removed   CESAREAN SECTION  1997/2006   CHOLECYSTECTOMY N/A 12/01/2012   Procedure: LAPAROSCOPIC CHOLECYSTECTOMY;  Surgeon: Stark Klein, MD;  Location: [facility];  Service: General;  Laterality: N/A;   COLONOSCOPY W/ POLYPECTOMY     EYE SURGERY Bilateral 2013   MULTIPLE TOOTH EXTRACTIONS     RADIOLOGY WITH ANESTHESIA Left 09/09/2018   Procedure: MRI WITH ANESTHESIA LEFT SHOULDER WITH CONTRAST;  Surgeon: Radiologist, Medication, MD;  Location: [facility];  Service: Radiology;  Laterality: Left;   SHOULDER ARTHROSCOPY WITH ROTATOR CUFF REPAIR AND SUBACROMIAL DECOMPRESSION Left 11/30/2018   Procedure: SHOULDER ARTHROSCOPY  WITH SUBACROMIAL DECOMPRESSION AND DISTAL CLAVICAL EXCISION;  Surgeon: Thornton Park, MD;  Location: [facility];  Service: Orthopedics;  Laterality: Left;  Diabetic - oral meds   TONSILLECTOMY     TUBAL LIGATION      Social History:  Social History   Socioeconomic History   Marital status: Legally Separated    Spouse name: Not on file   Number of children: Not on file   Years of education: Not on file   Highest education level: Not on file  Occupational History   Not on file  Social Needs   Financial resource strain: Not on file   Food insecurity    Worry: Not on file    Inability: Not on file   Transportation needs    Medical: Not on file    Non-medical: Not on file  Tobacco Use   Smoking status: Never  Smoker   Smokeless tobacco: Never Used  Substance and Sexual Activity   Alcohol use: Not Currently    Alcohol/week: 0.0 standard drinks   Drug use: No   Sexual activity: Not Currently    Partners: Male    Comment: offered condoms  Lifestyle   Physical activity    Days per week: Not on file    Minutes per session: Not on file   Stress: Not on file  Relationships   Social connections    Talks on phone: Not on file    Gets together: Not on file    Attends religious service: Not on file    Active member of club or organization: Not on file    Attends meetings of clubs or organizations: Not on file    Relationship status: Not on file   Intimate partner violence    Fear of current or ex partner: Not on file    Emotionally abused: Not on file    Physically abused: Not on file    Forced sexual activity: Not on file  Other Topics Concern   Not on file  Social History Narrative   Not on file    Family History:  Family History  Problem Relation Age of Onset   Hypertension Mother    Diabetes Mother    Cancer Father        lung ca   Seizures Daughter        53yr    Medications:   Current Outpatient Medications on File Prior to Visit  Medication Sig Dispense Refill   aspirin EC 81 MG EC tablet Take 1 tablet (81 mg total) by mouth daily.     atorvastatin (LIPITOR) 40 MG tablet TAKE 1 TABLET DAILY AT 6 P.M. 90 tablet 1   bictegravir-emtricitabine-tenofovir AF (BIKTARVY) 50-200-25 MG TABS tablet Take 1 tablet by mouth daily. 90 tablet 2   cetirizine (ZYRTEC) 10 MG tablet Take 10 mg by mouth daily.     fluticasone (FLONASE) 50 MCG/ACT nasal spray Place 1 spray into both nostrils daily as needed for allergies or rhinitis.     FREESTYLE LITE test strip TEST BLOOD SUGAR TWICE DAILY AS DIRECTED 100 each 8   glipiZIDE (GLUCOTROL) 10 MG tablet TAKE 1 TABLET TWICE A DAY BEFORE MEALS 180 tablet 1   glucose 4 GM chewable tablet Chew 1 tablet (4 g total) by mouth as  needed for low blood sugar. 30 tablet 3   KLOR-CON M20 20 MEQ tablet TAKE 1 TABLET DAILY 90 tablet 1   methocarbamol (ROBAXIN) 500 MG tablet Take 1 tablet (500 mg total)  by mouth 2 (two) times daily as needed for muscle spasms. 20 tablet 0   mirtazapine (REMERON) 15 MG tablet Take 1 tablet (15 mg total) by mouth at bedtime. 90 tablet 3   ondansetron (ZOFRAN) 4 MG tablet Take 1 tablet (4 mg total) by mouth every 8 (eight) hours as needed for nausea or vomiting. 30 tablet 0   predniSONE (DELTASONE) 10 MG tablet Take 3 tabs on days 1-3, take 2 tabs on days 4-6, take 1 tab on days 7-9 18 tablet 0   topiramate (TOPAMAX) 100 MG tablet TAKE 1 TABLET AT BEDTIME AS NEEDED 90 tablet 1   Vitamin D, Ergocalciferol, (DRISDOL) 1.25 MG (50000 UT) CAPS capsule Take 1 capsule (50,000 Units total) by mouth every 7 (seven) days. 12 capsule 0   No current facility-administered medications on file prior to visit.     Allergies:   Allergies  Allergen Reactions   Azithromycin Itching    Muscle aches   Erythromycin Nausea And Vomiting   Penicillins Nausea And Vomiting    Did it involve swelling of the face/tongue/throat, SOB, or low BP? No Did it involve sudden or severe rash/hives, skin peeling, or any reaction on the inside of your mouth or nose? No Did you need to seek medical attention at a hospital or doctor's office? No When did it last happen?childhood allergy If all above answers are NO, may proceed with cephalosporin use.    Tetracycline Nausea And Vomiting     Physical Exam  There were no vitals filed for this visit. There is no height or weight on file to calculate BMI. No exam data present  General: well developed, well nourished, pleasant middle-aged African-American female, seated, in no evident distress Head: head normocephalic and atraumatic.   Neck: supple with no carotid or supraclavicular bruits Cardiovascular: regular rate and rhythm, no murmurs Musculoskeletal:  no deformity Skin:  no rash/petichiae Vascular:  Normal pulses all extremities  Neurologic Exam Mental Status: Awake and fully alert.  Mild dysarthria.  Oriented to place and time. Recent and remote memory intact. Attention span, concentration and fund of knowledge appropriate. Mood and affect appropriate.  Cranial Nerves: Fundoscopic exam reveals sharp disc margins. Pupils equal, briskly reactive to light. Extraocular movements full without nystagmus. Visual fields full to confrontation. Hearing intact. Facial sensation intact. Face, tongue, palate moves normally and symmetrically.  Motor: Normal bulk and tone.  LUE: Weak grip strength with difficulty examining upper LUE strength due to pain and limited ROM; LLE: 4-/5 hip flexor; full strength RUE and RLE Sensory.:  Decreased sensation on left upper and lower extremity compared to right upper and lower extremity Coordination: Rapid alternating movements normal on right side. Finger-to-nose and heel-to-shin performed accurately on right side.  Orbits right arm over left arm.  Decreased left hand dexterity Gait and Station: Arises from chair without difficulty. Stance is normal. Gait demonstrates normal stride length and balance except for mild hemiplegic gait -does not use assistive device.  Unable to tandem walk. Reflexes: 1+ and symmetric. Toes downgoing.       Diagnostic Data (Labs, Imaging, Testing)  Ct Head Code Stroke Wo Contrast 02/18/2018 IMPRESSION:  1. No hemorrhage or mass effect.  2. ASPECTS is 10.   Ct Angio Head W Or Wo Contrast Ct Angio Neck W Or Wo Contrast 02/18/2018 IMPRESSION:  No emergent large vessel occlusion. Normal CTA of the head and neck   Mr Brain Wo Contrast 02/18/2018 IMPRESSION:  1. Acute small vessel infarct of the right  basal ganglia, in close proximity to the right internal capsule, in keeping with reported left-sided weakness.  2. No hemorrhage or mass effect.   TTE - Left ventricle: The cavity  size was normal. Wall thickness wasincreased in a pattern of mild LVH. Systolic function was normal.The estimated ejection fraction was in the range of 55% to 60%.Wall motion was normal; there were no regional wall motionabnormalities. Doppler parameters are consistent with abnormalleft ventricular relaxation (grade 1 diastolic dysfunction). Impressions: - No cardiac source of emboli was indentified.    ASSESSMENT: [name] is a 63 y.o. year old female here with right BG infarct on 02/18/2018 secondary to small vessel disease. Vascular risk factors include HLD and DM.  Patient is being seen today for follow-up visit and does continue to have left facial droop with slurred speech and LUE weakness with continued pain.    PLAN: -Continue aspirin 81 mg daily  and Lipitor 40 mg for secondary stroke prevention -Spoke to patient in regards to possible pain related to post stroke pain syndrome but did recommend to continue to follow with orthopedics to ensure no underlying condition. -Initiate Zanaflex 4 mg nightly for possible post stroke pain including muscle spasms and spasticity -F/u with PCP regarding your HLD and DM management -continue to monitor BP at home along with glucose levels -Maintain strict control of hypertension with blood pressure goal below 130/90, diabetes with hemoglobin A1c goal below 6.5% and cholesterol with LDL cholesterol (bad cholesterol) goal below 70 mg/dL. I also advised the patient to eat a healthy diet with plenty of whole grains, cereals, fruits and vegetables, exercise regularly and maintain ideal body weight.  Follow up in 3 months or call earlier if needed   Greater than 50% of time during this 25 minute visit was spent on counseling,explanation of diagnosis of right BG infarct, reviewing risk factor management of HLD and DM, planning of further management, discussion with patient and family and coordination of care    Frann Rider,  [facility]  [facility] [address] [place], [place] [address]  Phone [phone] Fax [phone] Note: This document was prepared with digital dictation and possible smart phrase technology. Any transcriptional errors that result from this process are unintentional.

## 2019-05-04 ENCOUNTER — Other Ambulatory Visit: Payer: BC Managed Care – PPO

## 2019-05-04 ENCOUNTER — Other Ambulatory Visit: Payer: Self-pay | Admitting: Internal Medicine

## 2019-05-04 DIAGNOSIS — E559 Vitamin D deficiency, unspecified: Secondary | ICD-10-CM

## 2019-05-04 DIAGNOSIS — Z1231 Encounter for screening mammogram for malignant neoplasm of breast: Secondary | ICD-10-CM

## 2019-05-05 ENCOUNTER — Ambulatory Visit (INDEPENDENT_AMBULATORY_CARE_PROVIDER_SITE_OTHER): Payer: BC Managed Care – PPO | Admitting: Internal Medicine

## 2019-05-05 ENCOUNTER — Encounter: Payer: Self-pay | Admitting: Internal Medicine

## 2019-05-05 ENCOUNTER — Other Ambulatory Visit: Payer: Self-pay

## 2019-05-05 VITALS — BP 132/86 | HR 92 | Temp 98.7°F | Wt 152.0 lb

## 2019-05-05 DIAGNOSIS — E1165 Type 2 diabetes mellitus with hyperglycemia: Secondary | ICD-10-CM | POA: Diagnosis not present

## 2019-05-05 DIAGNOSIS — E118 Type 2 diabetes mellitus with unspecified complications: Secondary | ICD-10-CM | POA: Diagnosis not present

## 2019-05-05 DIAGNOSIS — IMO0002 Reserved for concepts with insufficient information to code with codable children: Secondary | ICD-10-CM

## 2019-05-05 DIAGNOSIS — E559 Vitamin D deficiency, unspecified: Secondary | ICD-10-CM | POA: Diagnosis not present

## 2019-05-05 NOTE — Progress Notes (Signed)
Subjective:    Patient ID: [name], female    DOB: 1956-03-13, 63 y.o.   MRN: [id number]  HPI  Pt presents to the clinic today to follow up on her diabetes. She is concerned that her sugars are elevated. She is checking them 20 minutes after eating and they are running 220. She reports her normal fasting blood sugars average around 90. She is taking Glipizide as prescribed. She denies recent change in diet or activity level. She has noticed some tingling in her fingertips but denies visual changes, increased thirst, urinary frequency or non healing wounds. Her last A1C was 6.6%, 02/2019.  She is also due for repeat of Vit D. She finished 12 weeks of Ergocalciferol. She is currently not taking any Calcium or Vit D OTC.  Review of Systems      Past Medical History:  Diagnosis Date  . Arthritis    fingers  . Asthma   . Diabetes mellitus without complication ([place]) 12-30-48   hx. NIDDM-dx. 3 weeks ago  . Headache    migraines - none since stroke  . HIV (human immunodeficiency virus infection) ([place])   . Hyperlipidemia    hx  . Seasonal allergies   . Stroke ([place]) 02/18/2018   Residual Left side weakness and speech issues  . Ulcerative colitis ([place])   . Wears contact lenses   . Wears partial dentures    upper    Current Outpatient Medications  Medication Sig Dispense Refill  . aspirin EC 81 MG EC tablet Take 1 tablet (81 mg total) by mouth daily.    Marland Kitchen atorvastatin (LIPITOR) 40 MG tablet TAKE 1 TABLET DAILY AT 6 P.M. 90 tablet 1  . bictegravir-emtricitabine-tenofovir AF (BIKTARVY) 50-200-25 MG TABS tablet Take 1 tablet by mouth daily. 90 tablet 2  . cetirizine (ZYRTEC) 10 MG tablet Take 10 mg by mouth daily.    . fluticasone (FLONASE) 50 MCG/ACT nasal spray Place 1 spray into both nostrils daily as needed for allergies or rhinitis.    Marland Kitchen FREESTYLE LITE test strip TEST BLOOD SUGAR TWICE DAILY AS DIRECTED 100 each 8  . glipiZIDE (GLUCOTROL) 10 MG tablet TAKE 1 TABLET TWICE A  DAY BEFORE MEALS 180 tablet 1  . glucose 4 GM chewable tablet Chew 1 tablet (4 g total) by mouth as needed for low blood sugar. 30 tablet 3  . KLOR-CON M20 20 MEQ tablet TAKE 1 TABLET DAILY 90 tablet 1  . methocarbamol (ROBAXIN) 500 MG tablet Take 1 tablet (500 mg total) by mouth 2 (two) times daily as needed for muscle spasms. 20 tablet 0  . mirtazapine (REMERON) 15 MG tablet Take 1 tablet (15 mg total) by mouth at bedtime. 90 tablet 3  . ondansetron (ZOFRAN) 4 MG tablet Take 1 tablet (4 mg total) by mouth every 8 (eight) hours as needed for nausea or vomiting. 30 tablet 0  . topiramate (TOPAMAX) 100 MG tablet TAKE 1 TABLET AT BEDTIME AS NEEDED 90 tablet 1  . Vitamin D, Ergocalciferol, (DRISDOL) 1.25 MG (50000 UT) CAPS capsule Take 1 capsule (50,000 Units total) by mouth every 7 (seven) days. 12 capsule 0   No current facility-administered medications for this visit.     Allergies  Allergen Reactions  . Azithromycin Itching    Muscle aches  . Erythromycin Nausea And Vomiting  . Penicillins Nausea And Vomiting    Did it involve swelling of the face/tongue/throat, SOB, or low BP? No Did it involve sudden or severe rash/hives, skin peeling,  or any reaction on the inside of your mouth or nose? No Did you need to seek medical attention at a hospital or doctor's office? No When did it last happen?childhood allergy If all above answers are "NO", may proceed with cephalosporin use.   . Tetracycline Nausea And Vomiting    Family History  Problem Relation Age of Onset  . Hypertension Mother   . Diabetes Mother   . Cancer Father        lung ca  . Seizures Daughter        47yr    Social History   Socioeconomic History  . Marital status: Legally Separated    Spouse name: Not on file  . Number of children: Not on file  . Years of education: Not on file  . Highest education level: Not on file  Occupational History  . Not on file  Social Needs  . Financial resource strain: Not  on file  . Food insecurity    Worry: Not on file    Inability: Not on file  . Transportation needs    Medical: Not on file    Non-medical: Not on file  Tobacco Use  . Smoking status: Never Smoker  . Smokeless tobacco: Never Used  Substance and Sexual Activity  . Alcohol use: Not Currently    Alcohol/week: 0.0 standard drinks  . Drug use: No  . Sexual activity: Not Currently    Partners: Male    Comment: offered condoms  Lifestyle  . Physical activity    Days per week: Not on file    Minutes per session: Not on file  . Stress: Not on file  Relationships  . Social [place] phone: Not on file    Gets together: Not on file    Attends religious service: Not on file    Active member of club or organization: Not on file    Attends meetings of clubs or organizations: Not on file    Relationship status: Not on file  . Intimate partner violence    Fear of current or ex partner: Not on file    Emotionally abused: Not on file    Physically abused: Not on file    Forced sexual activity: Not on file  Other Topics Concern  . Not on file  Social History Narrative  . Not on file     Constitutional: Denies fever, malaise, fatigue, headache or abrupt weight changes.  Respiratory: Denies difficulty breathing, shortness of breath, cough or sputum production.   Cardiovascular: Denies chest pain, chest tightness, palpitations or swelling in the hands or feet.  Gastrointestinal: Denies abdominal pain, bloating, constipation, diarrhea or blood in the stool.  Skin: Denies redness, rashes, lesions or ulcercations.  Neurological: Pt reports tingling in hands. Denies dizziness, difficulty with memory, difficulty with speech.    No other specific complaints in a complete review of systems (except as listed in HPI above).  Objective:   Physical Exam  BP 132/86   Pulse 92   Temp 98.7 F (37.1 C) (Temporal)   Wt 152 lb (68.9 kg)   BMI 27.80 kg/m  Wt Readings from Last 3  Encounters:  05/05/19 152 lb (68.9 kg)  05/02/19 156 lb (70.8 kg)  03/09/19 151 lb (68.5 kg)    General: Appears her stated age, well developed, well nourished in NAD. Skin: Warm, dry and intact. No  ulcerations noted. Cardiovascular: Normal rate and rhythm. S1,S2 noted.  No murmur, rubs or gallops  noted.  Pulmonary/Chest: Normal effort and positive vesicular breath sounds. No respiratory distress. No wheezes, rales or ronchi noted.  Neurological: Alert and oriented. Negative Phalen's. Negative Tinel's.    BMET    Component Value Date/Time   NA 142 04/18/2019 1152   NA 138 06/23/2014 0503   K 3.7 04/18/2019 1152   K 4.2 06/23/2014 0503   CL 112 (H) 04/18/2019 1152   CL 104 06/23/2014 0503   CO2 23 04/18/2019 1152   CO2 27 06/23/2014 0503   GLUCOSE 146 (H) 04/18/2019 1152   GLUCOSE 246 (H) 06/23/2014 0503   BUN 14 04/18/2019 1152   BUN 17 06/23/2014 0503   CREATININE 0.90 04/18/2019 1152   CALCIUM 8.8 04/18/2019 1152   CALCIUM 8.7 06/23/2014 0503   GFRNONAA >60 03/01/2019 1730   GFRNONAA 65 09/27/2018 1053   GFRAA >60 03/01/2019 1730   GFRAA 76 09/27/2018 1053    Lipid Panel     Component Value Date/Time   CHOL 131 02/14/2019 1043   CHOL 211 (H) 06/22/2014 1432   TRIG 101.0 02/14/2019 1043   TRIG 473 (H) 06/22/2014 1432   HDL 47.10 02/14/2019 1043   HDL 42 06/22/2014 1432   CHOLHDL 3 02/14/2019 1043   VLDL 20.2 02/14/2019 1043   VLDL SEE COMMENT 06/22/2014 1432   LDLCALC 63 02/14/2019 1043   LDLCALC SEE COMMENT 06/22/2014 1432    CBC    Component Value Date/Time   WBC 4.4 04/18/2019 1152   RBC 3.75 (L) 04/18/2019 1152   HGB 11.5 (L) 04/18/2019 1152   HGB 12.2 06/23/2014 0503   HCT 34.2 (L) 04/18/2019 1152   HCT 37.2 06/23/2014 0503   PLT 217 04/18/2019 1152   PLT 251 06/23/2014 0503   MCV 91.2 04/18/2019 1152   MCV 89 06/23/2014 0503   MCH 30.7 04/18/2019 1152   MCHC 33.6 04/18/2019 1152   RDW 12.8 04/18/2019 1152   RDW 13.6 06/23/2014 0503    LYMPHSABS 2.3 03/01/2019 1730   LYMPHSABS 1.8 06/23/2014 0503   MONOABS 0.6 03/01/2019 1730   MONOABS 0.4 06/23/2014 0503   EOSABS 0.1 03/01/2019 1730   EOSABS 0.0 06/23/2014 0503   BASOSABS 0.0 03/01/2019 1730   BASOSABS 0.0 06/23/2014 0503    Hgb A1C Lab Results  Component Value Date   HGBA1C 6.6 (H) 02/14/2019            Assessment & Plan:   Vit D Deficiency:  Future Vit D level ordered  RTC in 2 weeks for lab only appt Webb Silversmith, NP

## 2019-05-10 ENCOUNTER — Other Ambulatory Visit: Payer: Self-pay | Admitting: Internal Medicine

## 2019-05-10 DIAGNOSIS — E119 Type 2 diabetes mellitus without complications: Secondary | ICD-10-CM

## 2019-05-13 ENCOUNTER — Encounter: Payer: Self-pay | Admitting: Internal Medicine

## 2019-05-13 NOTE — Patient Instructions (Signed)

## 2019-05-13 NOTE — Assessment & Plan Note (Signed)
Will have her repeat A1C 1st week of November Advised her to avoid checking sugars after meals unless in has been > 2 hours, BS should be < 180 at that time Monitor fasting blood sugars. Continue Glipizide for now Encouraged her to consume a low carb diet Encouraged yearly eye exams Flu and pneumonia vaccines UTD

## 2019-05-19 ENCOUNTER — Other Ambulatory Visit (INDEPENDENT_AMBULATORY_CARE_PROVIDER_SITE_OTHER): Payer: BC Managed Care – PPO

## 2019-05-19 DIAGNOSIS — E119 Type 2 diabetes mellitus without complications: Secondary | ICD-10-CM | POA: Diagnosis not present

## 2019-05-19 DIAGNOSIS — E559 Vitamin D deficiency, unspecified: Secondary | ICD-10-CM | POA: Diagnosis not present

## 2019-05-19 LAB — HEMOGLOBIN A1C: Hgb A1c MFr Bld: 8.6 % — ABNORMAL HIGH (ref 4.6–6.5)

## 2019-05-19 LAB — VITAMIN D 25 HYDROXY (VIT D DEFICIENCY, FRACTURES): VITD: 28.65 ng/mL — ABNORMAL LOW (ref 30.00–100.00)

## 2019-06-16 ENCOUNTER — Other Ambulatory Visit: Payer: Self-pay | Admitting: Infectious Diseases

## 2019-06-22 ENCOUNTER — Encounter: Payer: Self-pay | Admitting: Internal Medicine

## 2019-06-22 ENCOUNTER — Other Ambulatory Visit: Payer: Self-pay

## 2019-06-22 ENCOUNTER — Ambulatory Visit (INDEPENDENT_AMBULATORY_CARE_PROVIDER_SITE_OTHER): Payer: BC Managed Care – PPO | Admitting: Internal Medicine

## 2019-06-22 VITALS — BP 134/88 | HR 85 | Temp 98.3°F | Wt 152.0 lb

## 2019-06-22 DIAGNOSIS — R0789 Other chest pain: Secondary | ICD-10-CM | POA: Diagnosis not present

## 2019-06-22 DIAGNOSIS — M549 Dorsalgia, unspecified: Secondary | ICD-10-CM

## 2019-06-22 DIAGNOSIS — M25511 Pain in right shoulder: Secondary | ICD-10-CM | POA: Diagnosis not present

## 2019-06-22 MED ORDER — KETOROLAC TROMETHAMINE 30 MG/ML IJ SOLN
30.0000 mg | Freq: Once | INTRAMUSCULAR | Status: AC
  Administered 2019-06-22: 30 mg via INTRAMUSCULAR

## 2019-06-22 MED ORDER — TIZANIDINE HCL 4 MG PO TABS: 4.0000 mg | ORAL_TABLET | Freq: Every evening | ORAL | 0 refills | Status: DC | PRN

## 2019-06-22 NOTE — Addendum Note (Signed)
Addended by: Lurlean Nanny on: 06/22/2019 04:46 PM   Modules accepted: Orders

## 2019-06-22 NOTE — Progress Notes (Addendum)
Subjective:    Patient ID: [name], female    DOB: April 05, 1956, 63 y.o.   MRN: [id number]  HPI  Pt presents to the clinic today with c/o right shoulder pain. She noticed this 5 days ago. She describes the pain as throbbing. The pain radiates into the right arm, right upper chest and right upper back. She denies numbness, tingling or weakness of the right arm. She denies injury to the area but noticed this after putting up her Walker. She has tried Ibuprofen OTC with minimal relief.  Review of Systems      Past Medical History:  Diagnosis Date  . Arthritis    fingers  . Asthma   . Diabetes mellitus without complication ([place]) [id number]   hx. NIDDM-dx. 3 weeks ago  . Headache    migraines - none since stroke  . HIV (human immunodeficiency virus infection) ([place])   . Hyperlipidemia    hx  . Seasonal allergies   . Stroke ([place]) 02/18/2018   Residual Left side weakness and speech issues  . Ulcerative colitis ([place])   . Wears contact lenses   . Wears partial dentures    upper    Current Outpatient Medications  Medication Sig Dispense Refill  . aspirin EC 81 MG EC tablet Take 1 tablet (81 mg total) by mouth daily.    Marland Kitchen atorvastatin (LIPITOR) 40 MG tablet TAKE 1 TABLET DAILY AT 6 P.M. 90 tablet 1  . BIKTARVY 50-200-25 MG TABS tablet TAKE ONE TABLET BY MOUTH DAILY 90 tablet 1  . cetirizine (ZYRTEC) 10 MG tablet Take 10 mg by mouth daily.    . fluticasone (FLONASE) 50 MCG/ACT nasal spray Place 1 spray into both nostrils daily as needed for allergies or rhinitis.    Marland Kitchen FREESTYLE LITE test strip TEST BLOOD SUGAR TWICE DAILY AS DIRECTED 100 each 8  . glipiZIDE (GLUCOTROL) 10 MG tablet TAKE 1 TABLET TWICE A DAY BEFORE MEALS 180 tablet 1  . glucose 4 GM chewable tablet Chew 1 tablet (4 g total) by mouth as needed for low blood sugar. 30 tablet 3  . KLOR-CON M20 20 MEQ tablet TAKE 1 TABLET DAILY 90 tablet 1  . methocarbamol (ROBAXIN) 500 MG tablet Take 1 tablet (500 mg total)  by mouth 2 (two) times daily as needed for muscle spasms. 20 tablet 0  . mirtazapine (REMERON) 15 MG tablet Take 1 tablet (15 mg total) by mouth at bedtime. 90 tablet 3  . ondansetron (ZOFRAN) 4 MG tablet Take 1 tablet (4 mg total) by mouth every 8 (eight) hours as needed for nausea or vomiting. 30 tablet 0  . topiramate (TOPAMAX) 100 MG tablet TAKE 1 TABLET AT BEDTIME AS NEEDED 90 tablet 1  . Vitamin D, Ergocalciferol, (DRISDOL) 1.25 MG (50000 UT) CAPS capsule Take 1 capsule (50,000 Units total) by mouth every 7 (seven) days. 12 capsule 0   No current facility-administered medications for this visit.     Allergies  Allergen Reactions  . Azithromycin Itching    Muscle aches  . Erythromycin Nausea And Vomiting  . Penicillins Nausea And Vomiting    Did it involve swelling of the face/tongue/throat, SOB, or low BP? No Did it involve sudden or severe rash/hives, skin peeling, or any reaction on the inside of your mouth or nose? No Did you need to seek medical attention at a hospital or doctor's office? No When did it last happen?childhood allergy If all above answers are "NO", may proceed with cephalosporin use.   Marland Kitchen  Tetracycline Nausea And Vomiting    Family History  Problem Relation Age of Onset  . Hypertension Mother   . Diabetes Mother   . Cancer Father        lung ca  . Seizures Daughter        49yr    Social History   Socioeconomic History  . Marital status: Legally Separated    Spouse name: Not on file  . Number of children: Not on file  . Years of education: Not on file  . Highest education level: Not on file  Occupational History  . Not on file  Social Needs  . Financial resource strain: Not on file  . Food insecurity    Worry: Not on file    Inability: Not on file  . Transportation needs    Medical: Not on file    Non-medical: Not on file  Tobacco Use  . Smoking status: Never Smoker  . Smokeless tobacco: Never Used  Substance and Sexual Activity  .  Alcohol use: Not Currently    Alcohol/week: 0.0 standard drinks  . Drug use: No  . Sexual activity: Not Currently    Partners: Male    Comment: offered condoms  Lifestyle  . Physical activity    Days per week: Not on file    Minutes per session: Not on file  . Stress: Not on file  Relationships  . Social [place] phone: Not on file    Gets together: Not on file    Attends religious service: Not on file    Active member of club or organization: Not on file    Attends meetings of clubs or organizations: Not on file    Relationship status: Not on file  . Intimate partner violence    Fear of current or ex partner: Not on file    Emotionally abused: Not on file    Physically abused: Not on file    Forced sexual activity: Not on file  Other Topics Concern  . Not on file  Social History Narrative  . Not on file     Constitutional: Denies fever, malaise, fatigue, headache or abrupt weight changes.  HEENT: Denies eye pain, eye redness, ear pain, ringing in the ears, wax buildup, runny nose, nasal congestion, bloody nose, or sore throat. Respiratory: Denies difficulty breathing, shortness of breath, cough or sputum production.   Cardiovascular: Denies chest pain, chest tightness, palpitations or swelling in the hands or feet.  Gastrointestinal: Denies abdominal pain, bloating, constipation, diarrhea or blood in the stool.  GU: Denies urgency, frequency, pain with urination, burning sensation, blood in urine, odor or discharge. Musculoskeletal: Pt reports right shoulder pain, right upper chest wall pain and right upper back pain. Denies decrease in range of motion, difficulty with gait, muscle pain or joint swelling.  Skin: Denies redness, rashes, lesions or ulcercations.  Neurological: Denies numbness, tingling, weakness or problems with coordination.    No other specific complaints in a complete review of systems (except as listed in HPI above).  Objective:    Physical Exam   There were no vitals taken for this visit. Wt Readings from Last 3 Encounters:  05/05/19 152 lb (68.9 kg)  05/02/19 156 lb (70.8 kg)  03/09/19 151 lb (68.5 kg)    General: Appears her stated age, well developed, well nourished in NAD. Skin: Warm, dry and intact. No rashesnoted. Cardiovascular: Normal rate and rhythm. S1,S2 noted.  No murmur, rubs or gallops noted. Radial  pulses 2+ bilaterally. Pulmonary/Chest: Normal effort and positive vesicular breath sounds. No respiratory distress. No wheezes, rales or ronchi noted.  Musculoskeletal: Normal flexion and rotation to the right of the cervical spine. Pain with extension and rotation to the left of the cervical spine. No bony tenderness noted over the cervical spine. Decreased external rotation of the right shoulder. Normal internal rotation of the right shoulder, negative drop can test. No pain with palpation of the shoulder but has pain with palpation of the right upper chest and over the right scapula. Strength 5/5 BUE/BLE. Neurological: Alert and oriented. Coordination normal.    BMET    Component Value Date/Time   NA 142 04/18/2019 1152   NA 138 06/23/2014 0503   K 3.7 04/18/2019 1152   K 4.2 06/23/2014 0503   CL 112 (H) 04/18/2019 1152   CL 104 06/23/2014 0503   CO2 23 04/18/2019 1152   CO2 27 06/23/2014 0503   GLUCOSE 146 (H) 04/18/2019 1152   GLUCOSE 246 (H) 06/23/2014 0503   BUN 14 04/18/2019 1152   BUN 17 06/23/2014 0503   CREATININE 0.90 04/18/2019 1152   CALCIUM 8.8 04/18/2019 1152   CALCIUM 8.7 06/23/2014 0503   GFRNONAA >60 03/01/2019 1730   GFRNONAA 65 09/27/2018 1053   GFRAA >60 03/01/2019 1730   GFRAA 76 09/27/2018 1053    Lipid Panel     Component Value Date/Time   CHOL 131 02/14/2019 1043   CHOL 211 (H) 06/22/2014 1432   TRIG 101.0 02/14/2019 1043   TRIG 473 (H) 06/22/2014 1432   HDL 47.10 02/14/2019 1043   HDL 42 06/22/2014 1432   CHOLHDL 3 02/14/2019 1043   VLDL 20.2 02/14/2019  1043   VLDL SEE COMMENT 06/22/2014 1432   LDLCALC 63 02/14/2019 1043   LDLCALC SEE COMMENT 06/22/2014 1432    CBC    Component Value Date/Time   WBC 4.4 04/18/2019 1152   RBC 3.75 (L) 04/18/2019 1152   HGB 11.5 (L) 04/18/2019 1152   HGB 12.2 06/23/2014 0503   HCT 34.2 (L) 04/18/2019 1152   HCT 37.2 06/23/2014 0503   PLT 217 04/18/2019 1152   PLT 251 06/23/2014 0503   MCV 91.2 04/18/2019 1152   MCV 89 06/23/2014 0503   MCH 30.7 04/18/2019 1152   MCHC 33.6 04/18/2019 1152   RDW 12.8 04/18/2019 1152   RDW 13.6 06/23/2014 0503   LYMPHSABS 2.3 03/01/2019 1730   LYMPHSABS 1.8 06/23/2014 0503   MONOABS 0.6 03/01/2019 1730   MONOABS 0.4 06/23/2014 0503   EOSABS 0.1 03/01/2019 1730   EOSABS 0.0 06/23/2014 0503   BASOSABS 0.0 03/01/2019 1730   BASOSABS 0.0 06/23/2014 0503    Hgb A1C Lab Results  Component Value Date   HGBA1C 8.6 (H) 05/19/2019           Assessment & Plan:   Right Shoulder Pain:  Toradol 30 mg IM today Continue Ibuprofen 600 mg TID prn with meals RX for Zanaflex 4 mg QHS prn- sedation caution given Encouraged heat and massage Shoulder exercises given  Return precautions discussed Webb Silversmith, NP This visit occurred during the SARS-CoV-2 public health emergency.  Safety protocols were in place, including screening questions prior to the visit, additional usage of staff PPE, and extensive cleaning of exam room while observing appropriate contact time as indicated for disinfecting solutions.

## 2019-06-22 NOTE — Patient Instructions (Signed)
Muscle Strain A muscle strain is an injury that happens when a muscle is stretched longer than normal. This can happen during a fall, sports, or lifting. This can tear some muscle fibers. Usually, recovery from muscle strain takes 1-2 weeks. Complete healing normally takes 5-6 weeks. This condition is first treated with PRICE therapy. This involves:  Protecting your muscle from being injured again.  Resting your injured muscle.  Icing your injured muscle.  Applying pressure (compression) to your injured muscle. This may be done with a splint or elastic bandage.  Raising (elevating) your injured muscle. Your doctor may also recommend medicine for pain. Follow these instructions at home: If you have a splint:  Wear the splint as told by your doctor. Take it off only as told by your doctor.  Loosen the splint if your fingers or toes tingle, get numb, or turn cold and blue.  Keep the splint clean.  If the splint is not waterproof: ? Do not let it get wet. ? Cover it with a watertight covering when you take a bath or a shower. Managing pain, stiffness, and swelling   If directed, put ice on your injured area. ? If you have a removable splint, take it off as told by your doctor. ? Put ice in a plastic bag. ? Place a towel between your skin and the bag. ? Leave the ice on for 20 minutes, 2-3 times a day.  Move your fingers or toes often. This helps to avoid stiffness and lessen swelling.  Raise your injured area above the level of your heart while you are sitting or lying down.  Wear an elastic bandage as told by your doctor. Make sure it is not too tight. General instructions  Take over-the-counter and prescription medicines only as told by your doctor.  Limit your activity. Rest your injured muscle as told by your doctor. Your doctor may say that gentle movements are okay.  If physical therapy was prescribed, do exercises as told by your doctor.  Do not put pressure on any  part of the splint until it is fully hardened. This may take many hours.  Do not use any products that contain nicotine or tobacco, such as cigarettes and e-cigarettes. These can delay bone healing. If you need help quitting, ask your doctor.  Warm up before you exercise. This helps to prevent more muscle strains.  Ask your doctor when it is safe to drive if you have a splint.  Keep all follow-up visits as told by your doctor. This is important. Contact a doctor if:  You have more pain or swelling in your injured area. Get help right away if:  You have any of these problems in your injured area: ? You have numbness. ? You have tingling. ? You lose a lot of strength. Summary  A muscle strain is an injury that happens when a muscle is stretched longer than normal.  This condition is first treated with PRICE therapy. This includes protecting, resting, icing, adding pressure, and raising your injury.  Limit your activity. Rest your injured muscle as told by your doctor. Your doctor may say that gentle movements are okay.  Warm up before you exercise. This helps to prevent more muscle strains. This information is not intended to replace advice given to you by your health care provider. Make sure you discuss any questions you have with your health care provider. Document Released: 04/08/2008 Document Revised: 08/26/2018 Document Reviewed: 08/06/2016 Elsevier Patient Education  2020 [employer].

## 2019-06-27 ENCOUNTER — Telehealth: Payer: Self-pay

## 2019-06-27 ENCOUNTER — Ambulatory Visit: Admitting: Adult Health

## 2019-06-27 NOTE — Progress Notes (Deleted)
[facility] [address] [place]. [place] [address] [phone]       OFFICE FOLLOW UP NOTE  [name] Date of Birth:  October 04, 1955 Medical Record Number:  [id number]   Reason for Referral:  stroke follow up  CHIEF COMPLAINT:  No chief complaint on file.   HPI:  Update 06/27/2019: [name] is a 63 year old female who is being seen today for stroke follow-up.  Residual deficits ***.  She did undergo shoulder arthroscopy with subacromial decompression and distal clavicle excision on 11/30/2018 due to left shoulder impingement syndrome and possible rotator cuff tear.  Continues on aspirin 81 mg daily and atorvastatin for secondary stroke prevention without side effects.  Blood pressure today ***.  Glucose levels ***.  Denies new or worsening stroke/TIA symptoms.  Virtual visit 10/27/2018: She has been stable from a stroke standpoint with residual deficits of left hemiparesis without much improvement. Therapy on hold at this time due to [place]. Speech "okay at times" but feels as though it could be better. During conversation, frequent hesitant speech and pausing.  She continues to have left shoulder pain and was scheduled to undergo shoulder arthroscopy with distal clavicle excision but this was placed on hold due to COVID-19 safety precautions.  She does endorse difficulty with therapy and making progress due to shoulder pain.  She endorses difficulty performing daily functions or activities due to residual left arm weakness and limited range of motion due to pain.  She is interested in restarting therapy after procedure.  She has continued on aspirin 81 mg without side effects of bleeding or bruising.  Continues on atorvastatin without side effects myalgias.  She does not routinely monitor BP at home.  Glucose levels have been stable recent A1c 6.0.   Update 07/27/2018: Patient is being seen today for follow-up visit.  She continues to have left arm pain with  trial of injections but per notes, no relief.  CT wrist showed scapholunate dissociation. She states that she will have MRI completed today and then Friday will have follow up appointment. She states pain continues all through hand up to shoulder. Does endorse difficulty sleeping at night due to continued pain. She was prescribed remeron by PCP which has been helping her sleep but does continue to wake up occassionally due to pain.  Patient is frustrated by this continued pain as she has been having studies done but has not been receiving results per patient along with being told that different treatment plans should be done such as undergoing surgery versus not undergoing surgery.  She does endorse having NCV with EMG performed and was told that she has no indication of nerve damage.  She is fearful of ending up with frozen shoulder as a result of limited ROM in right arm along with difficulty participating in therapies for continued weakness.  She does continue to have left-sided facial paralysis with slurred speech and occasional mental slowing but otherwise has been recovering well from a stroke standpoint.  She continues on aspirin 81 mg without side effects of bleeding or bruising.  Continues on atorvastatin without side effects myalgias.  Blood pressure today satisfactory 116/69.  No further concerns at this time.  Denies new or worsening stroke/TIA symptoms.    Initial visit 04/06/2018: Patient is being seen today for hospital follow-up.  She continues to have left hemiparesis and dysarthria but has been improving.  She has completed PT/ST and is currently just receiving OT at this time.  She has completed 3  weeks of DAPT but has continued to take aspirin 81 mg along with Plavix 75 mg . Denies side effects of bleeding or bruising spoke to patient regarding discontinuation of Plavix at this time and continuation of aspirin 81 mg daily.  Continues to take Lipitor 40 mg.  She does endorse left shoulder/arm  pain which has been present since a stroke and is unable to determine if this could be due to statin myalgias or left hemiparesis.  Blood pressure today satisfactory 125/82.  Does monitor glucose levels at home and this morning's level 84.  She takes Topamax 50 mg at night consistently where prior to admission, she was taking it as needed.  She denies any recent headaches.  Denies new or worsening stroke/TIA symptoms.   Hospital admission 02/18/2018: ShannonShannon Hemingwayis a 63 y.o.femalewith history of DM, HLD, ulcerative colitis who was admitted to [facility] hospital for left facial droop, slurred speech and left hemiparesis. Patient was evaluated by telemetry neurology and after negative CT had obtained,  IV tPA was administered without complication and was transferred to [facility] for further evaluation and management.  MRI brain reviewed and showed right small BG infarct.  CTA head and neck unremarkable.  2D echo showed an EF of 55 to 60%.  Determined thatinfarct was due tosmall vessel disease.Does have a history of vascular risk factors of HTN, HLD and DB.  Patient was also found to have HIV during stroke workup. ID was consulted and was started on biktarvy (per notes, patient requested to not mention this other family members). LDL 136 and his patient was on statin PTA recommended Lipitor 40 mg at discharge.  HTN stable during admission and recommended long-term BP goal normotensive range.  A1c 9.1 and per notes, patient had stopped all home diabetic medications due to hypoglycemia which was confirmed during hospital admission with BG 66.  Recommended continued PCP follow-up for DM management.  Patient was not on antithrombotic PTA and recommended DAPT for 3 weeks and then aspirin alone.  Also endorses history of migraine and did increase Topamax dosage during hospital admission with possible consideration of following up with Dr. Jaynee Eagles as outpatient for continued management.  Patient was  discharged to [facility] for continued PT/OT/ST.     ROS:   14 system review of systems performed and negative with exception of joint pain, joint swelling, aching muscles and muscle cramps  PMH:  Past Medical History:  Diagnosis Date   Arthritis    fingers   Asthma    Diabetes mellitus without complication ([place]) 12-09-39   hx. NIDDM-dx. 3 weeks ago   Headache    migraines - none since stroke   HIV (human immunodeficiency virus infection) ([place])    Hyperlipidemia    hx   Seasonal allergies    Stroke ([place]) 02/18/2018   Residual Left side weakness and speech issues   Ulcerative colitis ([place])    Wears contact lenses    Wears partial dentures    upper    PSH:  Past Surgical History:  Procedure Laterality Date   BUNIONECTOMY     bilateral and toe nails of big toes removed   CESAREAN SECTION  1997/2006   CHOLECYSTECTOMY N/A 12/01/2012   Procedure: LAPAROSCOPIC CHOLECYSTECTOMY;  Surgeon: Stark Klein, MD;  Location: [facility];  Service: General;  Laterality: N/A;   COLONOSCOPY W/ POLYPECTOMY     EYE SURGERY Bilateral 2013   MULTIPLE TOOTH EXTRACTIONS     RADIOLOGY WITH ANESTHESIA Left 09/09/2018   Procedure: MRI WITH  ANESTHESIA LEFT SHOULDER WITH CONTRAST;  Surgeon: Radiologist, Medication, MD;  Location: [facility];  Service: Radiology;  Laterality: Left;   SHOULDER ARTHROSCOPY WITH ROTATOR CUFF REPAIR AND SUBACROMIAL DECOMPRESSION Left 11/30/2018   Procedure: SHOULDER ARTHROSCOPY WITH SUBACROMIAL DECOMPRESSION AND DISTAL CLAVICAL EXCISION;  Surgeon: Thornton Park, MD;  Location: [facility];  Service: Orthopedics;  Laterality: Left;  Diabetic - oral meds   TONSILLECTOMY     TUBAL LIGATION      Social History:  Social History   Socioeconomic History   Marital status: Legally Separated    Spouse name: Not on file   Number of children: Not on file   Years of education: Not on file   Highest education level: Not on file  Occupational History   Not on  file  Tobacco Use   Smoking status: Never Smoker   Smokeless tobacco: Never Used  Substance and Sexual Activity   Alcohol use: Not Currently    Alcohol/week: 0.0 standard drinks   Drug use: No   Sexual activity: Not Currently    Partners: Male    Comment: offered condoms  Other Topics Concern   Not on file  Social History Narrative   Not on file   Social Determinants of Health   Financial Resource Strain:    Difficulty of Paying Living Expenses: Not on file  Food Insecurity:    Worried About Charity fundraiser in the Last Year: Not on file   [employer] of Food in the Last Year: Not on file  Transportation Needs:    Lack of Transportation (Medical): Not on file   Lack of Transportation (Non-Medical): Not on file  Physical Activity:    Days of Exercise per Week: Not on file   Minutes of Exercise per Session: Not on file  Stress:    Feeling of Stress : Not on file  Social Connections:    Frequency of Communication with Friends and Family: Not on file   Frequency of Social Gatherings with Friends and Family: Not on file   Attends Religious Services: Not on file   Active Member of Clubs or Organizations: Not on file   Attends Archivist Meetings: Not on file   Marital Status: Not on file  Intimate Partner Violence:    Fear of Current or Ex-Partner: Not on file   Emotionally Abused: Not on file   Physically Abused: Not on file   Sexually Abused: Not on file    Family History:  Family History  Problem Relation Age of Onset   Hypertension Mother    Diabetes Mother    Cancer Father        lung ca   Seizures Daughter        86yr    Medications:   Current Outpatient Medications on File Prior to Visit  Medication Sig Dispense Refill   aspirin EC 81 MG EC tablet Take 1 tablet (81 mg total) by mouth daily.     atorvastatin (LIPITOR) 40 MG tablet TAKE 1 TABLET DAILY AT 6 P.M. 90 tablet 1   BIKTARVY 50-200-25 MG TABS tablet TAKE ONE  TABLET BY MOUTH DAILY 90 tablet 1   cetirizine (ZYRTEC) 10 MG tablet Take 10 mg by mouth daily.     fluticasone (FLONASE) 50 MCG/ACT nasal spray Place 1 spray into both nostrils daily as needed for allergies or rhinitis.     FREESTYLE LITE test strip TEST BLOOD SUGAR TWICE DAILY AS DIRECTED 100 each 8   glipiZIDE (  GLUCOTROL) 10 MG tablet TAKE 1 TABLET TWICE A DAY BEFORE MEALS 180 tablet 1   glucose 4 GM chewable tablet Chew 1 tablet (4 g total) by mouth as needed for low blood sugar. 30 tablet 3   KLOR-CON M20 20 MEQ tablet TAKE 1 TABLET DAILY 90 tablet 1   mirtazapine (REMERON) 15 MG tablet Take 1 tablet (15 mg total) by mouth at bedtime. 90 tablet 3   ondansetron (ZOFRAN) 4 MG tablet Take 1 tablet (4 mg total) by mouth every 8 (eight) hours as needed for nausea or vomiting. 30 tablet 0   tiZANidine (ZANAFLEX) 4 MG tablet Take 1 tablet (4 mg total) by mouth at bedtime as needed for muscle spasms. 20 tablet 0   topiramate (TOPAMAX) 100 MG tablet TAKE 1 TABLET AT BEDTIME AS NEEDED 90 tablet 1   Vitamin D, Ergocalciferol, (DRISDOL) 1.25 MG (50000 UT) CAPS capsule Take 1 capsule (50,000 Units total) by mouth every 7 (seven) days. 12 capsule 0   No current facility-administered medications on file prior to visit.    Allergies:   Allergies  Allergen Reactions   Azithromycin Itching    Muscle aches   Erythromycin Nausea And Vomiting   Penicillins Nausea And Vomiting    Did it involve swelling of the face/tongue/throat, SOB, or low BP? No Did it involve sudden or severe rash/hives, skin peeling, or any reaction on the inside of your mouth or nose? No Did you need to seek medical attention at a hospital or doctor's office? No When did it last happen?childhood allergy If all above answers are NO, may proceed with cephalosporin use.    Tetracycline Nausea And Vomiting     Physical Exam  There were no vitals filed for this visit. There is no height or weight on file to  calculate BMI. No exam data present  General: well developed, well nourished, pleasant middle-aged African-American female, seated, in no evident distress Head: head normocephalic and atraumatic.   Neck: supple with no carotid or supraclavicular bruits Cardiovascular: regular rate and rhythm, no murmurs Musculoskeletal: no deformity Skin:  no rash/petichiae Vascular:  Normal pulses all extremities  Neurologic Exam Mental Status: Awake and fully alert.  Mild dysarthria.  Oriented to place and time. Recent and remote memory intact. Attention span, concentration and fund of knowledge appropriate. Mood and affect appropriate.  Cranial Nerves: Fundoscopic exam reveals sharp disc margins. Pupils equal, briskly reactive to light. Extraocular movements full without nystagmus. Visual fields full to confrontation. Hearing intact. Facial sensation intact. Face, tongue, palate moves normally and symmetrically.  Motor: Normal bulk and tone.  LUE: Weak grip strength with difficulty examining upper LUE strength due to pain and limited ROM; LLE: 4-/5 hip flexor; full strength RUE and RLE Sensory.:  Decreased sensation on left upper and lower extremity compared to right upper and lower extremity Coordination: Rapid alternating movements normal on right side. Finger-to-nose and heel-to-shin performed accurately on right side.  Orbits right arm over left arm.  Decreased left hand dexterity Gait and Station: Arises from chair without difficulty. Stance is normal. Gait demonstrates normal stride length and balance except for mild hemiplegic gait -does not use assistive device.  Unable to tandem walk. Reflexes: 1+ and symmetric. Toes downgoing.       Diagnostic Data (Labs, Imaging, Testing)  Ct Head Code Stroke Wo Contrast 02/18/2018 IMPRESSION:  1. No hemorrhage or mass effect.  2. ASPECTS is 10.   Ct Angio Head W Or Wo Contrast Ct Angio Neck W  Or Wo Contrast 02/18/2018 IMPRESSION:  No emergent large  vessel occlusion. Normal CTA of the head and neck   Mr Brain Wo Contrast 02/18/2018 IMPRESSION:  1. Acute small vessel infarct of the right basal ganglia, in close proximity to the right internal capsule, in keeping with reported left-sided weakness.  2. No hemorrhage or mass effect.   TTE - Left ventricle: The cavity size was normal. Wall thickness wasincreased in a pattern of mild LVH. Systolic function was normal.The estimated ejection fraction was in the range of 55% to 60%.Wall motion was normal; there were no regional wall motionabnormalities. Doppler parameters are consistent with abnormalleft ventricular relaxation (grade 1 diastolic dysfunction). Impressions: - No cardiac source of emboli was indentified.    ASSESSMENT: [name] is a 63 y.o. year old female here with right BG infarct on 02/18/2018 secondary to small vessel disease. Vascular risk factors include HLD and DM.  Patient is being seen today for follow-up visit and does continue to have left facial droop with slurred speech and LUE weakness with continued pain.    PLAN: -Continue aspirin 81 mg daily  and Lipitor 40 mg for secondary stroke prevention -Spoke to patient in regards to possible pain related to post stroke pain syndrome but did recommend to continue to follow with orthopedics to ensure no underlying condition. -Initiate Zanaflex 4 mg nightly for possible post stroke pain including muscle spasms and spasticity -F/u with PCP regarding your HLD and DM management -continue to monitor BP at home along with glucose levels -Maintain strict control of hypertension with blood pressure goal below 130/90, diabetes with hemoglobin A1c goal below 6.5% and cholesterol with LDL cholesterol (bad cholesterol) goal below 70 mg/dL. I also advised the patient to eat a healthy diet with plenty of whole grains, cereals, fruits and vegetables, exercise regularly and maintain ideal body weight.  Follow up in 3 months or  call earlier if needed   Greater than 50% of time during this 25 minute visit was spent on counseling,explanation of diagnosis of right BG infarct, reviewing risk factor management of HLD and DM, planning of further management, discussion with patient and family and coordination of care    Venancio Poisson, AGNP-BC  [facility] [address] [place], [place] [address]  Phone [phone] Fax [phone] Note: This document was prepared with digital dictation and possible smart phrase technology. Any transcriptional errors that result from this process are unintentional.

## 2019-06-27 NOTE — Telephone Encounter (Signed)
Patient was a no call/no show for their appointment today.   

## 2019-06-28 ENCOUNTER — Encounter: Payer: Self-pay | Admitting: Adult Health

## 2019-07-04 MED ORDER — SITAGLIPTIN PHOSPHATE 100 MG PO TABS: 100.0000 mg | ORAL_TABLET | Freq: Every day | ORAL | 0 refills | Status: DC

## 2019-07-04 NOTE — Addendum Note (Signed)
Addended by: Lurlean Nanny on: 07/04/2019 11:05 AM   Modules accepted: Orders

## 2019-07-27 ENCOUNTER — Other Ambulatory Visit: Payer: Self-pay

## 2019-07-27 ENCOUNTER — Ambulatory Visit
Admission: RE | Admit: 2019-07-27 | Discharge: 2019-07-27 | Disposition: A | Discharge status: Home / Self Care | Source: Ambulatory Visit | Attending: Internal Medicine | Admitting: Internal Medicine

## 2019-07-27 DIAGNOSIS — M85851 Other specified disorders of bone density and structure, right thigh: Secondary | ICD-10-CM | POA: Diagnosis not present

## 2019-07-27 DIAGNOSIS — Z1231 Encounter for screening mammogram for malignant neoplasm of breast: Secondary | ICD-10-CM | POA: Diagnosis not present

## 2019-07-27 DIAGNOSIS — E559 Vitamin D deficiency, unspecified: Secondary | ICD-10-CM

## 2019-07-27 DIAGNOSIS — Z780 Asymptomatic menopausal state: Secondary | ICD-10-CM | POA: Diagnosis not present

## 2019-07-27 DIAGNOSIS — Z0001 Encounter for general adult medical examination with abnormal findings: Secondary | ICD-10-CM

## 2019-07-29 ENCOUNTER — Ambulatory Visit (INDEPENDENT_AMBULATORY_CARE_PROVIDER_SITE_OTHER)
Admission: RE | Admit: 2019-07-29 | Discharge: 2019-07-29 | Disposition: A | Discharge status: Home / Self Care | Payer: BC Managed Care – PPO | Source: Ambulatory Visit | Attending: Internal Medicine | Admitting: Internal Medicine

## 2019-07-29 ENCOUNTER — Other Ambulatory Visit: Payer: Self-pay

## 2019-07-29 ENCOUNTER — Telehealth: Payer: Self-pay | Admitting: Internal Medicine

## 2019-07-29 ENCOUNTER — Ambulatory Visit (INDEPENDENT_AMBULATORY_CARE_PROVIDER_SITE_OTHER): Payer: BC Managed Care – PPO | Admitting: Internal Medicine

## 2019-07-29 ENCOUNTER — Encounter: Payer: Self-pay | Admitting: Internal Medicine

## 2019-07-29 VITALS — BP 130/80 | HR 88 | Temp 98.1°F | Wt 153.0 lb

## 2019-07-29 DIAGNOSIS — G8929 Other chronic pain: Secondary | ICD-10-CM | POA: Diagnosis not present

## 2019-07-29 DIAGNOSIS — R202 Paresthesia of skin: Secondary | ICD-10-CM

## 2019-07-29 DIAGNOSIS — M25511 Pain in right shoulder: Secondary | ICD-10-CM

## 2019-07-29 MED ORDER — GABAPENTIN 100 MG PO CAPS: 100.0000 mg | ORAL_CAPSULE | Freq: Three times a day (TID) | ORAL | 3 refills | Status: DC

## 2019-07-29 NOTE — Telephone Encounter (Signed)
Patietn called  She stated that the gabapentin was sent to the wrong pharmacy   She is needing it sent to  [place]

## 2019-07-29 NOTE — Patient Instructions (Signed)
Shoulder Exercises Ask your health care provider which exercises are safe for you. Do exercises exactly as told by your health care provider and adjust them as directed. It is normal to feel mild stretching, pulling, tightness, or discomfort as you do these exercises. Stop right away if you feel sudden pain or your pain gets worse. Do not begin these exercises until told by your health care provider. Stretching exercises External rotation and abduction This exercise is sometimes called corner stretch. This exercise rotates your arm outward (external rotation) and moves your arm out from your body (abduction). 1. Stand in a doorway with one of your feet slightly in front of the other. This is called a staggered stance. If you cannot reach your forearms to the door frame, stand facing a corner of a room. 2. Choose one of the following positions as told by your health care provider: ? Place your hands and forearms on the door frame above your head. ? Place your hands and forearms on the door frame at the height of your head. ? Place your hands on the door frame at the height of your elbows. 3. Slowly move your weight onto your front foot until you feel a stretch across your chest and in the front of your shoulders. Keep your head and chest upright and keep your abdominal muscles tight. 4. Hold for __________ seconds. 5. To release the stretch, shift your weight to your back foot. Repeat __________ times. Complete this exercise __________ times a day. Extension, standing 1. Stand and hold a broomstick, a cane, or a similar object behind your back. ? Your hands should be a little wider than shoulder width apart. ? Your palms should face away from your back. 2. Keeping your elbows straight and your shoulder muscles relaxed, move the stick away from your body until you feel a stretch in your shoulders (extension). ? Avoid shrugging your shoulders while you move the stick. Keep your shoulder blades tucked  down toward the middle of your back. 3. Hold for __________ seconds. 4. Slowly return to the starting position. Repeat __________ times. Complete this exercise __________ times a day. Range-of-motion exercises Pendulum  1. Stand near a wall or a surface that you can hold onto for balance. 2. Bend at the waist and let your left / right arm hang straight down. Use your other arm to support you. Keep your back straight and do not lock your knees. 3. Relax your left / right arm and shoulder muscles, and move your hips and your trunk so your left / right arm swings freely. Your arm should swing because of the motion of your body, not because you are using your arm or shoulder muscles. 4. Keep moving your hips and trunk so your arm swings in the following directions, as told by your health care provider: ? Side to side. ? Forward and backward. ? In clockwise and counterclockwise circles. 5. Continue each motion for __________ seconds, or for as long as told by your health care provider. 6. Slowly return to the starting position. Repeat __________ times. Complete this exercise __________ times a day. Shoulder flexion, standing  1. Stand and hold a broomstick, a cane, or a similar object. Place your hands a little more than shoulder width apart on the object. Your left / right hand should be palm up, and your other hand should be palm down. 2. Keep your elbow straight and your shoulder muscles relaxed. Push the stick up with your healthy arm to   raise your left / right arm in front of your body, and then over your head until you feel a stretch in your shoulder (flexion). ? Avoid shrugging your shoulder while you raise your arm. Keep your shoulder blade tucked down toward the middle of your back. 3. Hold for __________ seconds. 4. Slowly return to the starting position. Repeat __________ times. Complete this exercise __________ times a day. Shoulder abduction, standing 1. Stand and hold a broomstick,  a cane, or a similar object. Place your hands a little more than shoulder width apart on the object. Your left / right hand should be palm up, and your other hand should be palm down. 2. Keep your elbow straight and your shoulder muscles relaxed. Push the object across your body toward your left / right side. Raise your left / right arm to the side of your body (abduction) until you feel a stretch in your shoulder. ? Do not raise your arm above shoulder height unless your health care provider tells you to do that. ? If directed, raise your arm over your head. ? Avoid shrugging your shoulder while you raise your arm. Keep your shoulder blade tucked down toward the middle of your back. 3. Hold for __________ seconds. 4. Slowly return to the starting position. Repeat __________ times. Complete this exercise __________ times a day. Internal rotation  1. Place your left / right hand behind your back, palm up. 2. Use your other hand to dangle an exercise band, a towel, or a similar object over your shoulder. Grasp the band with your left / right hand so you are holding on to both ends. 3. Gently pull up on the band until you feel a stretch in the front of your left / right shoulder. The movement of your arm toward the center of your body is called internal rotation. ? Avoid shrugging your shoulder while you raise your arm. Keep your shoulder blade tucked down toward the middle of your back. 4. Hold for __________ seconds. 5. Release the stretch by letting go of the band and lowering your hands. Repeat __________ times. Complete this exercise __________ times a day. Strengthening exercises External rotation  1. Sit in a stable chair without armrests. 2. Secure an exercise band to a stable object at elbow height on your left / right side. 3. Place a soft object, such as a folded towel or a small pillow, between your left / right upper arm and your body to move your elbow about 4 inches (10 cm) away  from your side. 4. Hold the end of the exercise band so it is tight and there is no slack. 5. Keeping your elbow pressed against the soft object, slowly move your forearm out, away from your abdomen (external rotation). Keep your body steady so only your forearm moves. 6. Hold for __________ seconds. 7. Slowly return to the starting position. Repeat __________ times. Complete this exercise __________ times a day. Shoulder abduction  1. Sit in a stable chair without armrests, or stand up. 2. Hold a __________ weight in your left / right hand, or hold an exercise band with both hands. 3. Start with your arms straight down and your left / right palm facing in, toward your body. 4. Slowly lift your left / right hand out to your side (abduction). Do not lift your hand above shoulder height unless your health care provider tells you that this is safe. ? Keep your arms straight. ? Avoid shrugging your shoulder while you   do this movement. Keep your shoulder blade tucked down toward the middle of your back. 5. Hold for __________ seconds. 6. Slowly lower your arm, and return to the starting position. Repeat __________ times. Complete this exercise __________ times a day. Shoulder extension 1. Sit in a stable chair without armrests, or stand up. 2. Secure an exercise band to a stable object in front of you so it is at shoulder height. 3. Hold one end of the exercise band in each hand. Your palms should face each other. 4. Straighten your elbows and lift your hands up to shoulder height. 5. Step back, away from the secured end of the exercise band, until the band is tight and there is no slack. 6. Squeeze your shoulder blades together as you pull your hands down to the sides of your thighs (extension). Stop when your hands are straight down by your sides. Do not let your hands go behind your body. 7. Hold for __________ seconds. 8. Slowly return to the starting position. Repeat __________ times.  Complete this exercise __________ times a day. Shoulder row 1. Sit in a stable chair without armrests, or stand up. 2. Secure an exercise band to a stable object in front of you so it is at waist height. 3. Hold one end of the exercise band in each hand. Position your palms so that your thumbs are facing the ceiling (neutral position). 4. Bend each of your elbows to a 90-degree angle (right angle) and keep your upper arms at your sides. 5. Step back until the band is tight and there is no slack. 6. Slowly pull your elbows back behind you. 7. Hold for __________ seconds. 8. Slowly return to the starting position. Repeat __________ times. Complete this exercise __________ times a day. Shoulder press-ups  1. Sit in a stable chair that has armrests. Sit upright, with your feet flat on the floor. 2. Put your hands on the armrests so your elbows are bent and your fingers are pointing forward. Your hands should be about even with the sides of your body. 3. Push down on the armrests and use your arms to lift yourself off the chair. Straighten your elbows and lift yourself up as much as you comfortably can. ? Move your shoulder blades down, and avoid letting your shoulders move up toward your ears. ? Keep your feet on the ground. As you get stronger, your feet should support less of your body weight as you lift yourself up. 4. Hold for __________ seconds. 5. Slowly lower yourself back into the chair. Repeat __________ times. Complete this exercise __________ times a day. Wall push-ups  1. Stand so you are facing a stable wall. Your feet should be about one arm-length away from the wall. 2. Lean forward and place your palms on the wall at shoulder height. 3. Keep your feet flat on the floor as you bend your elbows and lean forward toward the wall. 4. Hold for __________ seconds. 5. Straighten your elbows to push yourself back to the starting position. Repeat __________ times. Complete this exercise  __________ times a day. This information is not intended to replace advice given to you by your health care provider. Make sure you discuss any questions you have with your health care provider. Document Revised: 10/22/2018 Document Reviewed: 07/30/2018 Elsevier Patient Education  2020 [employer]  

## 2019-07-29 NOTE — Progress Notes (Signed)
Subjective:    Patient ID: [name], female    DOB: Jul 07, 1956, 64 y.o.   MRN: [id number]  HPI  Pt presents to the clinic today for follow up of right shoulder pain. This started 6 weeks ago. She was seen 12/9 for the same. She was given Toradol and Zanaflex for the same but reports no improvement in symptoms. She has also tried stretching and Ibuprofen without any relief of pain. The pain is now radiating down he right arm and she is having numbness in her fingers. She denies injury to the area.  Review of Systems  Past Medical History:  Diagnosis Date  . Arthritis    fingers  . Asthma   . Diabetes mellitus without complication ([place]) [id number]   hx. NIDDM-dx. 3 weeks ago  . Headache    migraines - none since stroke  . HIV (human immunodeficiency virus infection) ([place])   . Hyperlipidemia    hx  . Seasonal allergies   . Stroke ([place]) 02/18/2018   Residual Left side weakness and speech issues  . Ulcerative colitis ([place])   . Wears contact lenses   . Wears partial dentures    upper    Current Outpatient Medications  Medication Sig Dispense Refill  . aspirin EC 81 MG EC tablet Take 1 tablet (81 mg total) by mouth daily.    Marland Kitchen atorvastatin (LIPITOR) 40 MG tablet TAKE 1 TABLET DAILY AT 6 P.M. 90 tablet 1  . BIKTARVY 50-200-25 MG TABS tablet TAKE ONE TABLET BY MOUTH DAILY 90 tablet 1  . cetirizine (ZYRTEC) 10 MG tablet Take 10 mg by mouth daily.    . fluticasone (FLONASE) 50 MCG/ACT nasal spray Place 1 spray into both nostrils daily as needed for allergies or rhinitis.    Marland Kitchen FREESTYLE LITE test strip TEST BLOOD SUGAR TWICE DAILY AS DIRECTED 100 each 8  . glipiZIDE (GLUCOTROL) 10 MG tablet TAKE 1 TABLET TWICE A DAY BEFORE MEALS 180 tablet 1  . glucose 4 GM chewable tablet Chew 1 tablet (4 g total) by mouth as needed for low blood sugar. 30 tablet 3  . KLOR-CON M20 20 MEQ tablet TAKE 1 TABLET DAILY 90 tablet 1  . mirtazapine (REMERON) 15 MG tablet Take 1 tablet (15 mg total) by  mouth at bedtime. 90 tablet 3  . ondansetron (ZOFRAN) 4 MG tablet Take 1 tablet (4 mg total) by mouth every 8 (eight) hours as needed for nausea or vomiting. 30 tablet 0  . sitaGLIPtin (JANUVIA) 100 MG tablet Take 1 tablet (100 mg total) by mouth daily. 90 tablet 0  . tiZANidine (ZANAFLEX) 4 MG tablet Take 1 tablet (4 mg total) by mouth at bedtime as needed for muscle spasms. 20 tablet 0  . topiramate (TOPAMAX) 100 MG tablet TAKE 1 TABLET AT BEDTIME AS NEEDED 90 tablet 1  . Vitamin D, Ergocalciferol, (DRISDOL) 1.25 MG (50000 UT) CAPS capsule Take 1 capsule (50,000 Units total) by mouth every 7 (seven) days. 12 capsule 0   No current facility-administered medications for this visit.    Allergies  Allergen Reactions  . Azithromycin Itching    Muscle aches  . Erythromycin Nausea And Vomiting  . Penicillins Nausea And Vomiting    Did it involve swelling of the face/tongue/throat, SOB, or low BP? No Did it involve sudden or severe rash/hives, skin peeling, or any reaction on the inside of your mouth or nose? No Did you need to seek medical attention at a hospital or doctor's office? No  When did it last happen?childhood allergy If all above answers are "NO", may proceed with cephalosporin use.   . Tetracycline Nausea And Vomiting    Family History  Problem Relation Age of Onset  . Hypertension Mother   . Diabetes Mother   . Cancer Father        lung ca  . Seizures Daughter        22yr    Social History   Socioeconomic History  . Marital status: Legally Separated    Spouse name: Not on file  . Number of children: Not on file  . Years of education: Not on file  . Highest education level: Not on file  Occupational History  . Not on file  Tobacco Use  . Smoking status: Never Smoker  . Smokeless tobacco: Never Used  Substance and Sexual Activity  . Alcohol use: Not Currently    Alcohol/week: 0.0 standard drinks  . Drug use: No  . Sexual activity: Not Currently     Partners: Male    Comment: offered condoms  Other Topics Concern  . Not on file  Social History Narrative  . Not on file   Social Determinants of Health   Financial Resource Strain:   . Difficulty of Paying Living Expenses: Not on file  Food Insecurity:   . Worried About RCharity fundraiserin the Last Year: Not on file  . Ran Out of Food in the Last Year: Not on file  Transportation Needs:   . Lack of Transportation (Medical): Not on file  . Lack of Transportation (Non-Medical): Not on file  Physical Activity:   . Days of Exercise per Week: Not on file  . Minutes of Exercise per Session: Not on file  Stress:   . Feeling of Stress : Not on file  Social Connections:   . Frequency of Communication with Friends and Family: Not on file  . Frequency of Social Gatherings with Friends and Family: Not on file  . Attends Religious Services: Not on file  . Active Member of Clubs or Organizations: Not on file  . Attends CArchivistMeetings: Not on file  . Marital Status: Not on file  Intimate Partner Violence:   . Fear of Current or Ex-Partner: Not on file  . Emotionally Abused: Not on file  . Physically Abused: Not on file  . Sexually Abused: Not on file     Constitutional: Denies fever, malaise, fatigue, headache or abrupt weight changes.  Musculoskeletal: Pt reports right shoulder pain,decreased range of motion.  Denies difficulty with gait, muscle pain or joint swelling.  Skin: Denies redness, rashes, lesions or ulcercations.  Neurological: Pt reports numbness of right hand. Denies dizziness, difficulty with memory, difficulty with speech or problems with balance and coordination.    No other specific complaints in a complete review of systems (except as listed in HPI above).     Objective:   Physical Exam  BP 130/80   Pulse 88   Temp 98.1 F (36.7 C) (Temporal)   Wt 153 lb (69.4 kg)   SpO2 98%   BMI 27.98 kg/m   Wt Readings from Last 3 Encounters:   06/22/19 152 lb (68.9 kg)  05/05/19 152 lb (68.9 kg)  05/02/19 156 lb (70.8 kg)    General: Appears her stated age, well developed, well nourished in NAD. Skin: Warm, dry and intact. No rashes noted. Musculoskeletal: Normal flexion, extension and rotation of the cervical spine. No bony tenderness noted over the  cervical spine. Decreased internal and external rotation of the right shoulder. Generalized pain with palpation of the right shoulder.  Positive drop can test on right. Strength 5/5 RUE. No signs of joint swelling. No difficulty with gait.  Neurological: Alert and oriented. Coordination normal.    BMET    Component Value Date/Time   NA 142 04/18/2019 1152   NA 138 06/23/2014 0503   K 3.7 04/18/2019 1152   K 4.2 06/23/2014 0503   CL 112 (H) 04/18/2019 1152   CL 104 06/23/2014 0503   CO2 23 04/18/2019 1152   CO2 27 06/23/2014 0503   GLUCOSE 146 (H) 04/18/2019 1152   GLUCOSE 246 (H) 06/23/2014 0503   BUN 14 04/18/2019 1152   BUN 17 06/23/2014 0503   CREATININE 0.90 04/18/2019 1152   CALCIUM 8.8 04/18/2019 1152   CALCIUM 8.7 06/23/2014 0503   GFRNONAA >60 03/01/2019 1730   GFRNONAA 65 09/27/2018 1053   GFRAA >60 03/01/2019 1730   GFRAA 76 09/27/2018 1053    Lipid Panel     Component Value Date/Time   CHOL 131 02/14/2019 1043   CHOL 211 (H) 06/22/2014 1432   TRIG 101.0 02/14/2019 1043   TRIG 473 (H) 06/22/2014 1432   HDL 47.10 02/14/2019 1043   HDL 42 06/22/2014 1432   CHOLHDL 3 02/14/2019 1043   VLDL 20.2 02/14/2019 1043   VLDL SEE COMMENT 06/22/2014 1432   LDLCALC 63 02/14/2019 1043   LDLCALC SEE COMMENT 06/22/2014 1432    CBC    Component Value Date/Time   WBC 4.4 04/18/2019 1152   RBC 3.75 (L) 04/18/2019 1152   HGB 11.5 (L) 04/18/2019 1152   HGB 12.2 06/23/2014 0503   HCT 34.2 (L) 04/18/2019 1152   HCT 37.2 06/23/2014 0503   PLT 217 04/18/2019 1152   PLT 251 06/23/2014 0503   MCV 91.2 04/18/2019 1152   MCV 89 06/23/2014 0503   MCH 30.7  04/18/2019 1152   MCHC 33.6 04/18/2019 1152   RDW 12.8 04/18/2019 1152   RDW 13.6 06/23/2014 0503   LYMPHSABS 2.3 03/01/2019 1730   LYMPHSABS 1.8 06/23/2014 0503   MONOABS 0.6 03/01/2019 1730   MONOABS 0.4 06/23/2014 0503   EOSABS 0.1 03/01/2019 1730   EOSABS 0.0 06/23/2014 0503   BASOSABS 0.0 03/01/2019 1730   BASOSABS 0.0 06/23/2014 0503    Hgb A1C Lab Results  Component Value Date   HGBA1C 8.6 (H) 05/19/2019           Assessment & Plan:   Chronic Right Shoulder Pain, Paresthesias of Right Arm:  X-ray cervical spine and right shoulder ordered. Rx sent for Gabapentin 147m TID. Continue Ibuprofen OTC Referred to physical therapy. Consider MRI if no improvement with PT  Return precautions discussed RWebb Silversmith NP  This visit occurred during the SARS-CoV-2 public health emergency.  Safety protocols were in place, including screening questions prior to the visit, additional usage of staff PPE, and extensive cleaning of exam room while observing appropriate contact time as indicated for disinfecting solutions.

## 2019-08-01 MED ORDER — GABAPENTIN 100 MG PO CAPS: 100.0000 mg | ORAL_CAPSULE | Freq: Three times a day (TID) | ORAL | 3 refills | Status: DC

## 2019-08-01 NOTE — Telephone Encounter (Signed)
Medication sent to [facility]

## 2019-08-01 NOTE — Addendum Note (Signed)
Addended by: Jearld Fenton on: 08/01/2019 08:07 AM   Modules accepted: Orders

## 2019-08-09 ENCOUNTER — Other Ambulatory Visit: Payer: Self-pay | Admitting: Infectious Diseases

## 2019-08-10 ENCOUNTER — Encounter: Payer: Self-pay | Admitting: Internal Medicine

## 2019-08-10 DIAGNOSIS — M25511 Pain in right shoulder: Secondary | ICD-10-CM | POA: Diagnosis not present

## 2019-08-12 DIAGNOSIS — M25511 Pain in right shoulder: Secondary | ICD-10-CM | POA: Diagnosis not present

## 2019-08-18 DIAGNOSIS — M25511 Pain in right shoulder: Secondary | ICD-10-CM | POA: Diagnosis not present

## 2019-09-23 IMAGING — DX DG KNEE COMPLETE 4+V*L*
5 series · 5 of 5 positions shown · non-contrast
Comparison: None in PACs

CLINICAL DATA: One month of left knee pain following a fall.

EXAM:
LEFT KNEE - COMPLETE 4+ VIEW

[knee ap]
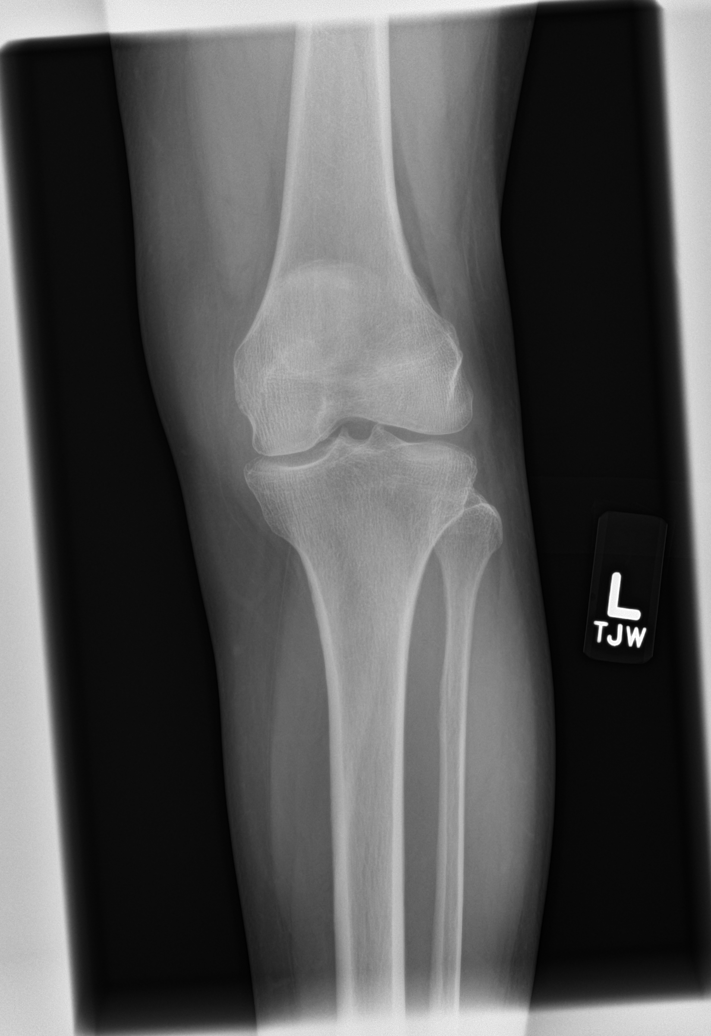

[knee lat]
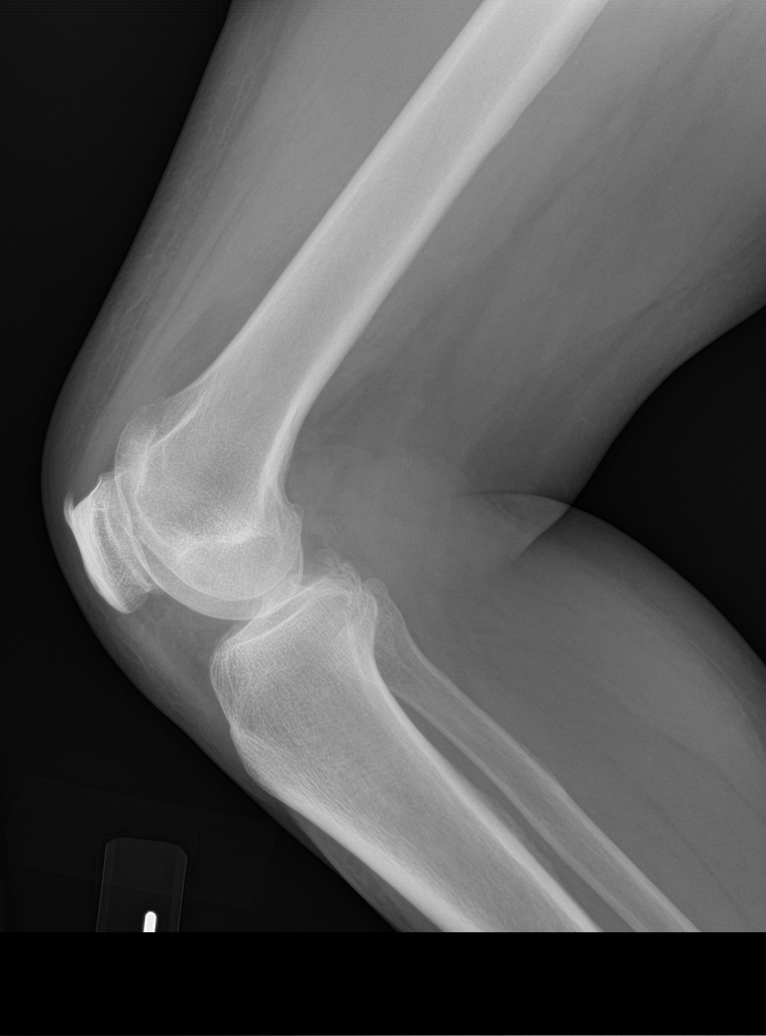

[patella skyline]
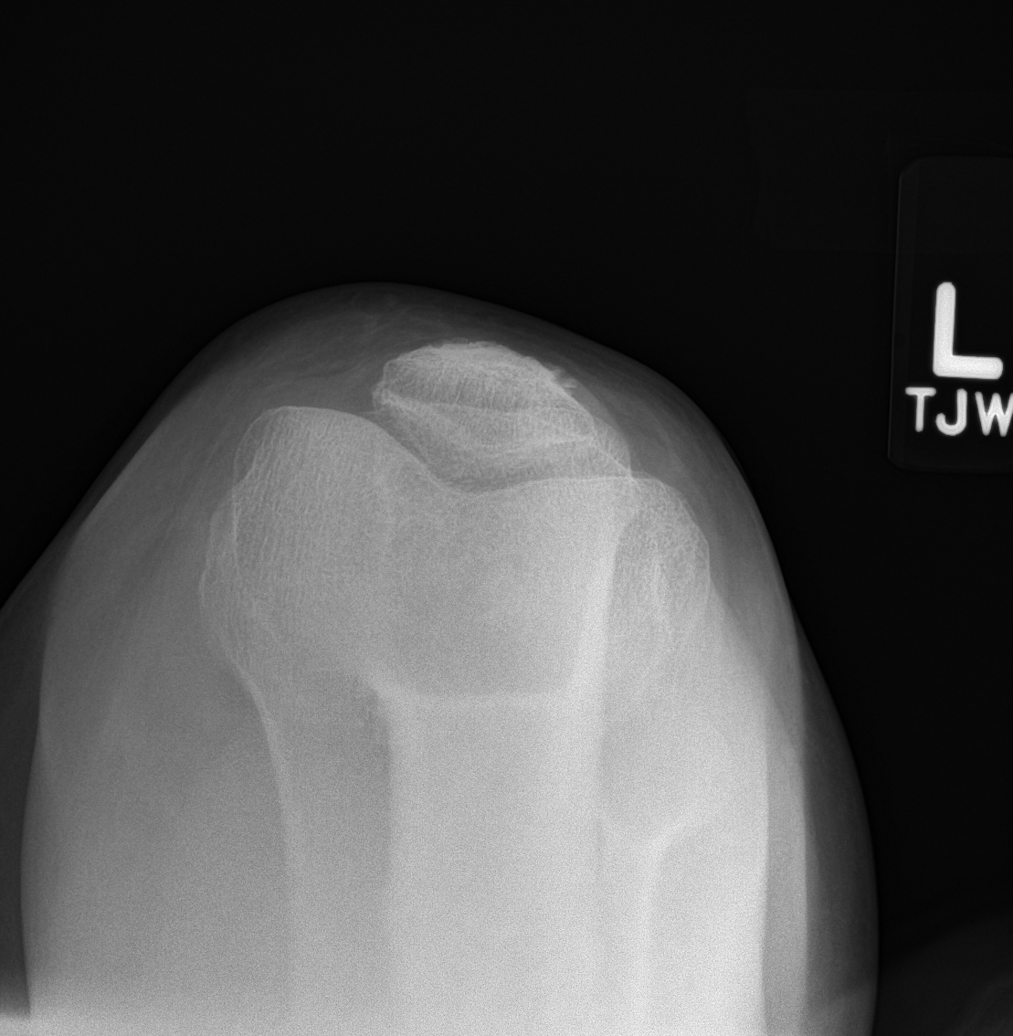

[knee obl (1 of 2)]
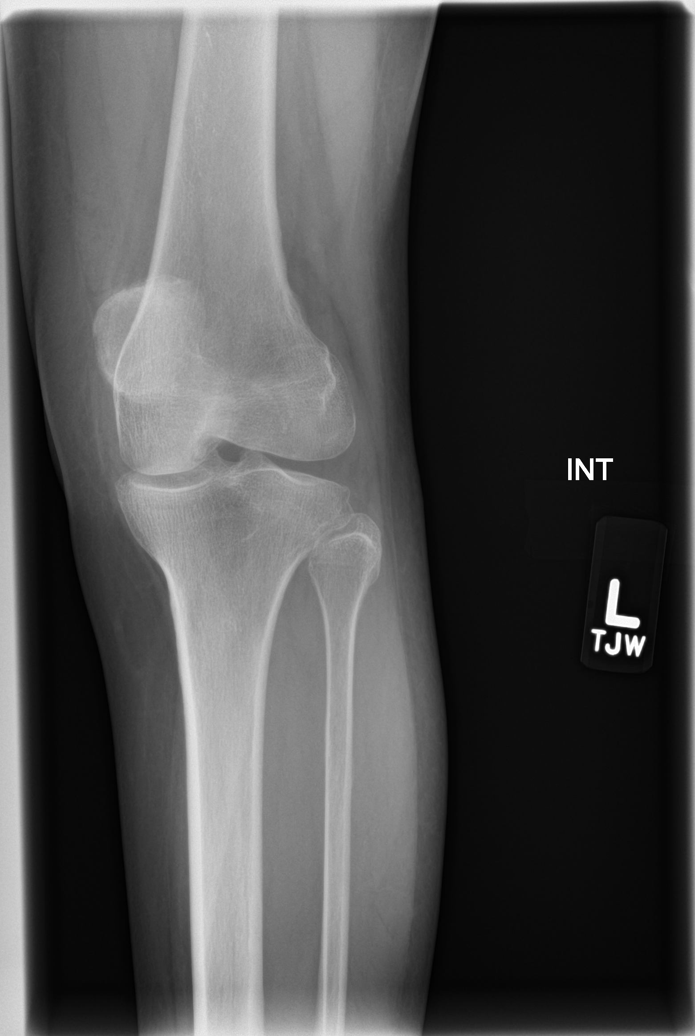

[knee obl (2 of 2)]
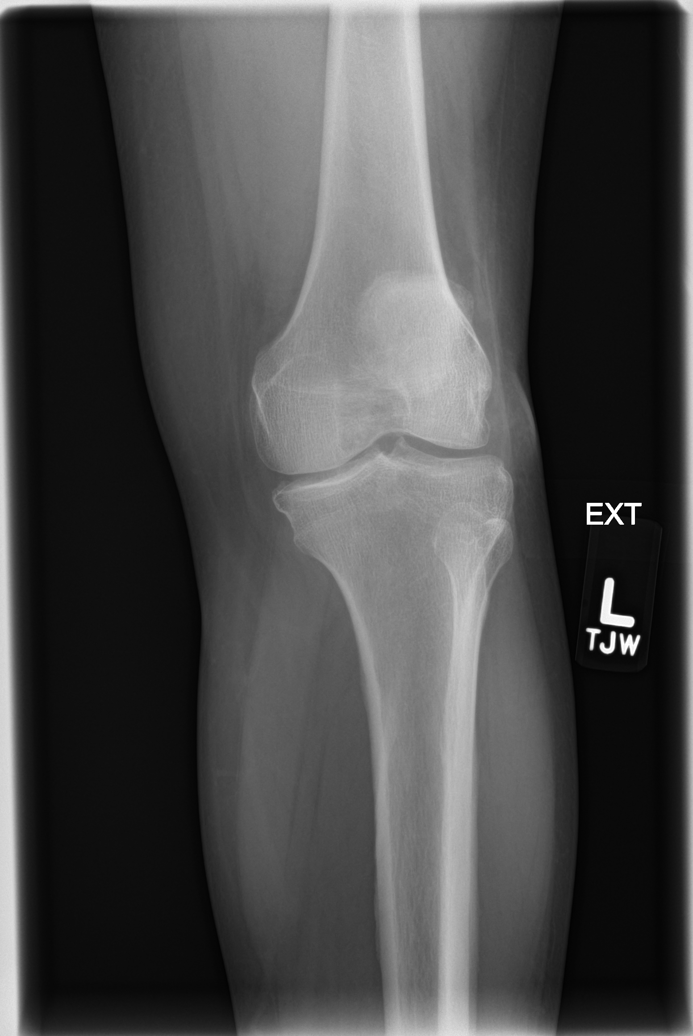

[5 of 5 positions shown; findings below may reference images not displayed]

FINDINGS: The bones of the left knee are subjectively adequately mineralized.
No acute or healing fracture is observed. There is no joint
effusion. The joint spaces are reasonably well-maintained. There is
beaking of the tibial spines. Small spurs arise from the articular
margins of the patella.
IMPRESSION: There is mild degenerative change centered on the tibial spines and
patellofemoral compartments. There is no acute or healing fracture
nor other acute bony abnormality.

## 2019-10-07 ENCOUNTER — Telehealth: Payer: Self-pay

## 2019-10-07 NOTE — Telephone Encounter (Signed)
Patient contacted the office and stated that she has diabetes and she has noticed numbness and tingling in both of her feet for the past couple weeks - an thinks this may be neuropathy. She is wondering if there is anything she can do for this, or there is anything Rollene Fare can prescribe? Or if she will need an office visit to discuss this?

## 2019-10-07 NOTE — Telephone Encounter (Signed)
Appt has been scheduled.

## 2019-10-07 NOTE — Telephone Encounter (Signed)
Have her make an in person appt for further evaluation.

## 2019-10-10 ENCOUNTER — Other Ambulatory Visit: Payer: Self-pay

## 2019-10-10 ENCOUNTER — Encounter: Payer: Self-pay | Admitting: Internal Medicine

## 2019-10-10 ENCOUNTER — Ambulatory Visit (INDEPENDENT_AMBULATORY_CARE_PROVIDER_SITE_OTHER): Payer: BC Managed Care – PPO | Admitting: Internal Medicine

## 2019-10-10 ENCOUNTER — Telehealth: Payer: Self-pay | Admitting: Internal Medicine

## 2019-10-10 VITALS — BP 124/82 | HR 88 | Temp 97.8°F | Wt 154.0 lb

## 2019-10-10 DIAGNOSIS — G5601 Carpal tunnel syndrome, right upper limb: Secondary | ICD-10-CM | POA: Diagnosis not present

## 2019-10-10 DIAGNOSIS — E1140 Type 2 diabetes mellitus with diabetic neuropathy, unspecified: Secondary | ICD-10-CM | POA: Diagnosis not present

## 2019-10-10 LAB — TSH: TSH: 2.88 u[IU]/mL (ref 0.35–4.50)

## 2019-10-10 LAB — HEMOGLOBIN A1C: Hgb A1c MFr Bld: 6.9 % — ABNORMAL HIGH (ref 4.6–6.5)

## 2019-10-10 LAB — VITAMIN B12: Vitamin B-12: 154 pg/mL — ABNORMAL LOW (ref 211–911)

## 2019-10-10 LAB — FOLATE: Folate: 6.8 ng/mL (ref >5.9)

## 2019-10-10 LAB — VITAMIN D 25 HYDROXY (VIT D DEFICIENCY, FRACTURES): VITD: 45.45 ng/mL (ref 30.00–100.00)

## 2019-10-10 NOTE — Progress Notes (Signed)
Subjective:    Patient ID: [name], female    DOB: 1955-07-29, 64 y.o.   MRN: [id number]  HPI  Pt presents to the clinic today wanting to discuss neuropathy. She report numbness and tingling in her hands and feet. It is constant. The right hand is worse than the left, but her feet are bilateral. It is worse at night. Her blood sugar ranges 100-120. Her last A1C was 8.6, Vit D 28. She is not taking Gabapentin 100 mg TID as prescribed because it ran out 1 week ago. She has not tried any additional meds OTC.  Review of Systems      Past Medical History:  Diagnosis Date  . Arthritis    fingers  . Asthma   . Diabetes mellitus without complication ([place]) 0-94-07   hx. NIDDM-dx. 3 weeks ago  . Headache    migraines - none since stroke  . HIV (human immunodeficiency virus infection) ([place])   . Hyperlipidemia    hx  . Seasonal allergies   . Stroke ([place]) 02/18/2018   Residual Left side weakness and speech issues  . Ulcerative colitis ([place])   . Wears contact lenses   . Wears partial dentures    upper    Current Outpatient Medications  Medication Sig Dispense Refill  . aspirin EC 81 MG EC tablet Take 1 tablet (81 mg total) by mouth daily.    Marland Kitchen atorvastatin (LIPITOR) 40 MG tablet TAKE 1 TABLET DAILY AT 6 P.M. 90 tablet 1  . BIKTARVY 50-200-25 MG TABS tablet TAKE ONE TABLET BY MOUTH DAILY 30 tablet 5  . cetirizine (ZYRTEC) 10 MG tablet Take 10 mg by mouth daily.    . fluticasone (FLONASE) 50 MCG/ACT nasal spray Place 1 spray into both nostrils daily as needed for allergies or rhinitis.    Marland Kitchen FREESTYLE LITE test strip TEST BLOOD SUGAR TWICE DAILY AS DIRECTED 100 each 8  . gabapentin (NEURONTIN) 100 MG capsule Take 1 capsule (100 mg total) by mouth 3 (three) times daily. 90 capsule 3  . glipiZIDE (GLUCOTROL) 10 MG tablet TAKE 1 TABLET TWICE A DAY BEFORE MEALS 180 tablet 1  . glucose 4 GM chewable tablet Chew 1 tablet (4 g total) by mouth as needed for low blood sugar. 30 tablet 3   . KLOR-CON M20 20 MEQ tablet TAKE 1 TABLET DAILY 90 tablet 1  . mirtazapine (REMERON) 15 MG tablet Take 1 tablet (15 mg total) by mouth at bedtime. 90 tablet 3  . ondansetron (ZOFRAN) 4 MG tablet Take 1 tablet (4 mg total) by mouth every 8 (eight) hours as needed for nausea or vomiting. 30 tablet 0  . sitaGLIPtin (JANUVIA) 100 MG tablet Take 1 tablet (100 mg total) by mouth daily. 90 tablet 0  . tiZANidine (ZANAFLEX) 4 MG tablet Take 1 tablet (4 mg total) by mouth at bedtime as needed for muscle spasms. (Patient not taking: Reported on 07/29/2019) 20 tablet 0  . topiramate (TOPAMAX) 100 MG tablet TAKE 1 TABLET AT BEDTIME AS NEEDED 90 tablet 1  . Vitamin D, Ergocalciferol, (DRISDOL) 1.25 MG (50000 UT) CAPS capsule Take 1 capsule (50,000 Units total) by mouth every 7 (seven) days. 12 capsule 0   No current facility-administered medications for this visit.    Allergies  Allergen Reactions  . Azithromycin Itching    Muscle aches  . Erythromycin Nausea And Vomiting  . Penicillins Nausea And Vomiting    Did it involve swelling of the face/tongue/throat, SOB, or low BP?  No Did it involve sudden or severe rash/hives, skin peeling, or any reaction on the inside of your mouth or nose? No Did you need to seek medical attention at a hospital or doctor's office? No When did it last happen?childhood allergy If all above answers are "NO", may proceed with cephalosporin use.   . Tetracycline Nausea And Vomiting    Family History  Problem Relation Age of Onset  . Hypertension Mother   . Diabetes Mother   . Cancer Father        lung ca  . Seizures Daughter        24yr    Social History   Socioeconomic History  . Marital status: Legally Separated    Spouse name: Not on file  . Number of children: Not on file  . Years of education: Not on file  . Highest education level: Not on file  Occupational History  . Not on file  Tobacco Use  . Smoking status: Never Smoker  . Smokeless  tobacco: Never Used  Substance and Sexual Activity  . Alcohol use: Not Currently    Alcohol/week: 0.0 standard drinks  . Drug use: No  . Sexual activity: Not Currently    Partners: Male    Comment: offered condoms  Other Topics Concern  . Not on file  Social History Narrative  . Not on file   Social Determinants of Health   Financial Resource Strain:   . Difficulty of Paying Living Expenses:   Food Insecurity:   . Worried About RCharity fundraiserin the Last Year:   . RArboriculturistin the Last Year:   Transportation Needs:   . LFilm/video editor(Medical):   .Marland KitchenLack of Transportation (Non-Medical):   Physical Activity:   . Days of Exercise per Week:   . Minutes of Exercise per Session:   Stress:   . Feeling of Stress :   Social Connections:   . Frequency of Communication with Friends and Family:   . Frequency of Social Gatherings with Friends and Family:   . Attends Religious Services:   . Active Member of Clubs or Organizations:   . Attends CArchivistMeetings:   .Marland KitchenMarital Status:   Intimate Partner Violence:   . Fear of Current or Ex-Partner:   . Emotionally Abused:   .Marland KitchenPhysically Abused:   . Sexually Abused:      Constitutional: Denies fever, malaise, fatigue, headache or abrupt weight changes.  Respiratory: Denies difficulty breathing, shortness of breath, cough or sputum production.   Cardiovascular: Denies chest pain, chest tightness, palpitations or swelling in the hands or feet.  Musculoskeletal: Denies decrease in range of motion, difficulty with gait, muscle pain or joint pain and swelling.  Skin: Denies redness, rashes, lesions or ulcercations.  Neurological: Pt reports neuropathic pan right hand, bilateral feet. Denies dizziness, difficulty with memory, difficulty with speech or problems with balance and coordination.    No other specific complaints in a complete review of systems (except as listed in HPI above).  Objective:   Physical  Exam  BP 124/82   Pulse 88   Temp 97.8 F (36.6 C) (Temporal)   Wt 154 lb (69.9 kg)   BMI 28.17 kg/m   Wt Readings from Last 3 Encounters:  07/29/19 153 lb (69.4 kg)  06/22/19 152 lb (68.9 kg)  05/05/19 152 lb (68.9 kg)    General: Appears his stated age, well developed, well nourished in NAD. Skin: Warm, dry  and intact. No ulcerations noted. Cardiovascular: Normal rate and rhythm. Radial pulses 2+ bilaterally. Pedal pulses 2+ bilaterally. Pulmonary/Chest: Normal effort and positive vesicular breath sounds. No respiratory distress. No wheezes, rales or ronchi noted.  Musculoskeletal:  No difficulty with gait.  Neurological: Alert and oriented. Decreased sensation in BLE. + Phalen's, + Tinel's.  Coordination normal.   BMET    Component Value Date/Time   NA 142 04/18/2019 1152   NA 138 06/23/2014 0503   K 3.7 04/18/2019 1152   K 4.2 06/23/2014 0503   CL 112 (H) 04/18/2019 1152   CL 104 06/23/2014 0503   CO2 23 04/18/2019 1152   CO2 27 06/23/2014 0503   GLUCOSE 146 (H) 04/18/2019 1152   GLUCOSE 246 (H) 06/23/2014 0503   BUN 14 04/18/2019 1152   BUN 17 06/23/2014 0503   CREATININE 0.90 04/18/2019 1152   CALCIUM 8.8 04/18/2019 1152   CALCIUM 8.7 06/23/2014 0503   GFRNONAA >60 03/01/2019 1730   GFRNONAA 65 09/27/2018 1053   GFRAA >60 03/01/2019 1730   GFRAA 76 09/27/2018 1053    Lipid Panel     Component Value Date/Time   CHOL 131 02/14/2019 1043   CHOL 211 (H) 06/22/2014 1432   TRIG 101.0 02/14/2019 1043   TRIG 473 (H) 06/22/2014 1432   HDL 47.10 02/14/2019 1043   HDL 42 06/22/2014 1432   CHOLHDL 3 02/14/2019 1043   VLDL 20.2 02/14/2019 1043   VLDL SEE COMMENT 06/22/2014 1432   LDLCALC 63 02/14/2019 1043   LDLCALC SEE COMMENT 06/22/2014 1432    CBC    Component Value Date/Time   WBC 4.4 04/18/2019 1152   RBC 3.75 (L) 04/18/2019 1152   HGB 11.5 (L) 04/18/2019 1152   HGB 12.2 06/23/2014 0503   HCT 34.2 (L) 04/18/2019 1152   HCT 37.2 06/23/2014 0503    PLT 217 04/18/2019 1152   PLT 251 06/23/2014 0503   MCV 91.2 04/18/2019 1152   MCV 89 06/23/2014 0503   MCH 30.7 04/18/2019 1152   MCHC 33.6 04/18/2019 1152   RDW 12.8 04/18/2019 1152   RDW 13.6 06/23/2014 0503   LYMPHSABS 2.3 03/01/2019 1730   LYMPHSABS 1.8 06/23/2014 0503   MONOABS 0.6 03/01/2019 1730   MONOABS 0.4 06/23/2014 0503   EOSABS 0.1 03/01/2019 1730   EOSABS 0.0 06/23/2014 0503   BASOSABS 0.0 03/01/2019 1730   BASOSABS 0.0 06/23/2014 0503    Hgb A1C Lab Results  Component Value Date   HGBA1C 8.6 (H) 05/19/2019           Assessment & Plan:   Neuropathic Pain, Carpal Tunnel Syndrome, Right:  Will check A1C, Vit D, B12, Folate and TSH Recommend cock up splint to wear at night Consider restarting Gabapentin 100 mg BID 300 mg QHS Consider referral to neurology for EMG testing Discussed the importance of good blood sugar control  Will follow up after labs, return precautions discussed Webb Silversmith, NP This visit occurred during the SARS-CoV-2 public health emergency.  Safety protocols were in place, including screening questions prior to the visit, additional usage of staff PPE, and extensive cleaning of exam room while observing appropriate contact time as indicated for disinfecting solutions.

## 2019-10-10 NOTE — Telephone Encounter (Signed)
Patient is requesting a call back Stated she seen her lab results on her my chart but does not know what she should do next. Or if a prescription was called in for her  please advise

## 2019-10-10 NOTE — Patient Instructions (Signed)
Neuropathic Pain Neuropathic pain is pain caused by damage to the nerves that are responsible for certain sensations in your body (sensory nerves). The pain can be caused by:  Damage to the sensory nerves that send signals to your spinal cord and brain (peripheral nervous system).  Damage to the sensory nerves in your brain or spinal cord (central nervous system). Neuropathic pain can make you more sensitive to pain. Even a minor sensation can feel very painful. This is usually a long-term condition that can be difficult to treat. The type of pain differs from person to person. It may:  Start suddenly (acute), or it may develop slowly and last for a long time (chronic).  Come and go as damaged nerves heal, or it may stay at the same level for years.  Cause emotional distress, loss of sleep, and a lower quality of life. What are the causes? The most common cause of this condition is diabetes. Many other diseases and conditions can also cause neuropathic pain. Causes of neuropathic pain can be classified as:  Toxic. This is caused by medicines and chemicals. The most common cause of toxic neuropathic pain is damage from cancer treatments (chemotherapy).  Metabolic. This can be caused by: ? Diabetes. This is the most common disease that damages the nerves. ? Lack of vitamin B from long-term alcohol abuse.  Traumatic. Any injury that cuts, crushes, or stretches a nerve can cause damage and pain. A common example is feeling pain after losing an arm or leg (phantom limb pain).  Compression-related. If a sensory nerve gets trapped or compressed for a long period of time, the blood supply to the nerve can be cut off.  Vascular. Many blood vessel diseases can cause neuropathic pain by decreasing blood supply and oxygen to nerves.  Autoimmune. This type of pain results from diseases in which the body's defense system (immune system) mistakenly attacks sensory nerves. Examples of autoimmune diseases  that can cause neuropathic pain include lupus and multiple sclerosis.  Infectious. Many types of viral infections can damage sensory nerves and cause pain. Shingles infection is a common cause of this type of pain.  Inherited. Neuropathic pain can be a symptom of many diseases that are passed down through families (genetic). What increases the risk? You are more likely to develop this condition if:  You have diabetes.  You smoke.  You drink too much alcohol.  You are taking certain medicines, including medicines that kill cancer cells (chemotherapy) or that treat immune system disorders. What are the signs or symptoms? The main symptom is pain. Neuropathic pain is often described as:  Burning.  Shock-like.  Stinging.  Hot or cold.  Itching. How is this diagnosed? No single test can diagnose neuropathic pain. It is diagnosed based on:  Physical exam and your symptoms. Your health care provider will ask you about your pain. You may be asked to use a pain scale to describe how bad your pain is.  Tests. These may be done to see if you have a high sensitivity to pain and to help find the cause and location of any sensory nerve damage. They include: ? Nerve conduction studies to test how well nerve signals travel through your sensory nerves (electrodiagnostic testing). ? Stimulating your sensory nerves through electrodes on your skin and measuring the response in your spinal cord and brain (somatosensory evoked potential).  Imaging studies, such as: ? X-rays. ? CT scan. ? MRI. How is this treated? Treatment for neuropathic pain may change   over time. You may need to try different treatment options or a combination of treatments. Some options include:  Treating the underlying cause of the neuropathy, such as diabetes, kidney disease, or vitamin deficiencies.  Stopping medicines that can cause neuropathy, such as chemotherapy.  Medicine to relieve pain. Medicines may  include: ? Prescription or over-the-counter pain medicine. ? Anti-seizure medicine. ? Antidepressant medicines. ? Pain-relieving patches that are applied to painful areas of skin. ? A medicine to numb the area (local anesthetic), which can be injected as a nerve block.  Transcutaneous nerve stimulation. This uses electrical currents to block painful nerve signals. The treatment is painless.  Alternative treatments, such as: ? Acupuncture. ? Meditation. ? Massage. ? Physical therapy. ? Pain management programs. ? Counseling. Follow these instructions at home: Medicines   Take over-the-counter and prescription medicines only as told by your health care provider.  Do not drive or use heavy machinery while taking prescription pain medicine.  If you are taking prescription pain medicine, take actions to prevent or treat constipation. Your health care provider may recommend that you: ? Drink enough fluid to keep your urine pale yellow. ? Eat foods that are high in fiber, such as fresh fruits and vegetables, whole grains, and beans. ? Limit foods that are high in fat and processed sugars, such as fried or sweet foods. ? Take an over-the-counter or prescription medicine for constipation. Lifestyle   Have a good support system at home.  Consider joining a chronic pain support group.  Do not use any products that contain nicotine or tobacco, such as cigarettes and e-cigarettes. If you need help quitting, ask your health care provider.  Do not drink alcohol. General instructions  Learn as much as you can about your condition.  Work closely with all your health care providers to find the treatment plan that works best for you.  Ask your health care provider what activities are safe for you.  Keep all follow-up visits as told by your health care provider. This is important. Contact a health care provider if:  Your pain treatments are not working.  You are having side effects  from your medicines.  You are struggling with tiredness (fatigue), mood changes, depression, or anxiety. Summary  Neuropathic pain is pain caused by damage to the nerves that are responsible for certain sensations in your body (sensory nerves).  Neuropathic pain may come and go as damaged nerves heal, or it may stay at the same level for years.  Neuropathic pain is usually a long-term condition that can be difficult to treat. Consider joining a chronic pain support group. This information is not intended to replace advice given to you by your health care provider. Make sure you discuss any questions you have with your health care provider. Document Revised: 10/21/2018 Document Reviewed: 07/17/2017 Elsevier Patient Education  2020 [employer]  

## 2019-10-11 ENCOUNTER — Telehealth: Payer: Self-pay | Admitting: Internal Medicine

## 2019-10-11 MED ORDER — GABAPENTIN 100 MG PO CAPS: ORAL_CAPSULE | ORAL | 0 refills | Status: DC

## 2019-10-11 MED ORDER — SITAGLIPTIN PHOSPHATE 100 MG PO TABS: 100.0000 mg | ORAL_TABLET | Freq: Every day | ORAL | 1 refills | Status: DC

## 2019-10-11 NOTE — Addendum Note (Signed)
Addended by: Lurlean Nanny on: 10/11/2019 12:56 PM   Modules accepted: Orders

## 2019-10-11 NOTE — Telephone Encounter (Signed)
Patient called the pharmacy to get a refill on Januvia. [facility] told patient she had to call our office to get refill.  Patient only has 2 tablets left.  Patient would like a rx sent to [facility] for a 1 week supply, since it will take a couple days to receive the medication from [facility]. Do we have any discount coupons for Januvia?

## 2019-10-13 ENCOUNTER — Other Ambulatory Visit: Payer: Self-pay

## 2019-10-13 ENCOUNTER — Ambulatory Visit (INDEPENDENT_AMBULATORY_CARE_PROVIDER_SITE_OTHER): Payer: BC Managed Care – PPO

## 2019-10-13 DIAGNOSIS — E538 Deficiency of other specified B group vitamins: Secondary | ICD-10-CM | POA: Diagnosis not present

## 2019-10-13 MED ORDER — CYANOCOBALAMIN 1000 MCG/ML IJ SOLN
1000.0000 ug | Freq: Once | INTRAMUSCULAR | Status: AC
  Administered 2019-10-13: 1000 ug via INTRAMUSCULAR

## 2019-10-13 NOTE — Progress Notes (Signed)
Per orders of NP, [facility], injection of vit F12 given by Brenton Grills. Patient tolerated injection well.

## 2019-10-25 ENCOUNTER — Other Ambulatory Visit: Payer: Self-pay | Admitting: Internal Medicine

## 2019-10-31 ENCOUNTER — Other Ambulatory Visit: Payer: BC Managed Care – PPO

## 2019-10-31 ENCOUNTER — Other Ambulatory Visit (HOSPITAL_COMMUNITY)
Admission: RE | Admit: 2019-10-31 | Discharge: 2019-10-31 | Disposition: A | Discharge status: Home / Self Care | Payer: BC Managed Care – PPO | Source: Ambulatory Visit | Attending: Infectious Diseases | Admitting: Infectious Diseases

## 2019-10-31 ENCOUNTER — Other Ambulatory Visit: Payer: Self-pay

## 2019-10-31 DIAGNOSIS — Z113 Encounter for screening for infections with a predominantly sexual mode of transmission: Secondary | ICD-10-CM

## 2019-10-31 DIAGNOSIS — Z79899 Other long term (current) drug therapy: Secondary | ICD-10-CM

## 2019-10-31 DIAGNOSIS — B20 Human immunodeficiency virus [HIV] disease: Secondary | ICD-10-CM

## 2019-11-01 LAB — URINE CYTOLOGY ANCILLARY ONLY
Chlamydia: NEGATIVE
Comment: NEGATIVE
Comment: NORMAL
Neisseria Gonorrhea: NEGATIVE

## 2019-11-01 LAB — T-HELPER CELL (CD4) - (RCID CLINIC ONLY)
CD4 % Helper T Cell: 47 % (ref 33–65)
CD4 T Cell Abs: 886 /uL (ref 400–1790)

## 2019-11-02 ENCOUNTER — Other Ambulatory Visit: Payer: Self-pay

## 2019-11-02 ENCOUNTER — Encounter: Payer: Self-pay | Admitting: Adult Health

## 2019-11-02 ENCOUNTER — Ambulatory Visit (INDEPENDENT_AMBULATORY_CARE_PROVIDER_SITE_OTHER): Payer: BC Managed Care – PPO | Admitting: Adult Health

## 2019-11-02 VITALS — BP 145/78 | HR 89 | Temp 98.2°F | Ht 62.0 in | Wt 155.0 lb

## 2019-11-02 DIAGNOSIS — I10 Essential (primary) hypertension: Secondary | ICD-10-CM | POA: Diagnosis not present

## 2019-11-02 DIAGNOSIS — E119 Type 2 diabetes mellitus without complications: Secondary | ICD-10-CM

## 2019-11-02 DIAGNOSIS — E785 Hyperlipidemia, unspecified: Secondary | ICD-10-CM

## 2019-11-02 DIAGNOSIS — Z8673 Personal history of transient ischemic attack (TIA), and cerebral infarction without residual deficits: Secondary | ICD-10-CM | POA: Diagnosis not present

## 2019-11-02 NOTE — Progress Notes (Signed)
I agree with the above plan 

## 2019-11-02 NOTE — Progress Notes (Signed)
[facility] [address] [place]. [place] [address] [phone]       OFFICE FOLLOW UP NOTE  Ms. Netta Fodge Date of Birth:  [birth year] Medical Record Number:  [id number]   Reason for Referral:  hospital stroke follow up  CHIEF COMPLAINT:  Chief Complaint  Patient presents with  . Cerebrovascular Accident    rm 9, one year FU "right hand fingers tingle, feel numb, hurt; left hand is beginning to feel this way; my feet, hurt, tingle- on Gabapentin but its not helping, using a hand brace on right- not helpful"    HPI:  Today, 11/02/2019, Ms. Sadek returns for stroke follow-up.  She has been doing well from a stroke standpoint with residual mild LUE weakness and dysarthria but overall greatly improving.  Greatest concern today is regarding neuropathy in bilateral lower extremities distally and more recently occasional numbness/tingling and pain bilateral fingers.  Currently being evaluated with PCP and recently started receiving B12 injections for deficiency.  She also endorses short-term memory concerns as well as increased marital stressors.  She has continued on aspirin 81 mg daily and atorvastatin 40 mg daily for stroke prevention.  Recent LDL 60.  Blood pressure today 145/78.  Reports stable glucose levels with recent A1c 6.9.  No further concerns at this time.   History copied for reference purposes only Virtual visit 10/27/2018: Trinika Cortese is a 64 y.o. female who has been followed in this office for right BG infarct in 02/2018. She was initially scheduled for face-to-face office visit today at this time for follow up visit but due to [facility] office visit rescheduled for non-face-to-face telephone visit. She has been stable from a stroke standpoint with residual deficits of left hemiparesis without much improvement. Therapy on hold at this time due to [place]. Speech "okay at times" but feels as though it could be better. During  conversation, frequent hesitant speech and pausing.  She continues to have left shoulder pain and was scheduled to undergo shoulder arthroscopy with distal clavicle excision but this was placed on hold due to COVID-19 safety precautions.  She does endorse difficulty with therapy and making progress due to shoulder pain.  She endorses difficulty performing daily functions or activities due to residual left arm weakness and limited range of motion due to pain.  She is interested in restarting therapy after procedure.  She has continued on aspirin 81 mg without side effects of bleeding or bruising.  Continues on atorvastatin without side effects myalgias.  She does not routinely monitor BP at home.  Glucose levels have been stable recent A1c 6.0.  She endorses difficulty  Interval history 07/27/2018: Patient is being seen today for follow-up visit.  She continues to have left arm pain with trial of injections but per notes, no relief.  CT wrist showed scapholunate dissociation. She states that she will have MRI completed today and then Friday will have follow up appointment. She states pain continues all through hand up to shoulder. Does endorse difficulty sleeping at night due to continued pain. She was prescribed remeron by PCP which has been helping her sleep but does continue to wake up occassionally due to pain.  Patient is frustrated by this continued pain as she has been having studies done but has not been receiving results per patient along with being told that different treatment plans should be done such as undergoing surgery versus not undergoing surgery.  She does endorse having NCV with EMG performed and was told that she has  no indication of nerve damage.  She is fearful of ending up with frozen shoulder as a result of limited ROM in right arm along with difficulty participating in therapies for continued weakness.  She does continue to have left-sided facial paralysis with slurred speech and occasional  mental slowing but otherwise has been recovering well from a stroke standpoint.  She continues on aspirin 81 mg without side effects of bleeding or bruising.  Continues on atorvastatin without side effects myalgias.  Blood pressure today satisfactory 116/69.  No further concerns at this time.  Denies new or worsening stroke/TIA symptoms.  Initial visit 04/06/2018: Patient is being seen today for hospital follow-up.  She continues to have left hemiparesis and dysarthria but has been improving.  She has completed PT/ST and is currently just receiving OT at this time.  She has completed 3 weeks of DAPT but has continued to take aspirin 81 mg along with Plavix 75 mg . Denies side effects of bleeding or bruising spoke to patient regarding discontinuation of Plavix at this time and continuation of aspirin 81 mg daily.  Continues to take Lipitor 40 mg.  She does endorse left shoulder/arm pain which has been present since a stroke and is unable to determine if this could be due to statin myalgias or left hemiparesis.  Blood pressure today satisfactory 125/82.  Does monitor glucose levels at home and this morning's level 84.  She takes Topamax 50 mg at night consistently where prior to admission, she was taking it as needed.  She denies any recent headaches.  Denies new or worsening stroke/TIA symptoms.   Hospital admission 02/18/2018: Ms.Rumaisa Hemingwayis a 64 y.o.femalewith history of DM, HLD, ulcerative colitis who was admitted to [facility] hospital for left facial droop, slurred speech and left hemiparesis. Patient was evaluated by telemetry neurology and after negative CT had obtained,  IV tPA was administered without complication and was transferred to [facility] for further evaluation and management.  MRI brain reviewed and showed right small BG infarct.  CTA head and neck unremarkable.  2D echo showed an EF of 55 to 60%.  Determined thatinfarct was due tosmall vessel disease.Does have a history of  vascular risk factors of HTN, HLD and DB.  Patient was also found to have HIV during stroke workup. ID was consulted and was started on biktarvy (per notes, patient requested to not mention this other family members). LDL 136 and his patient was on statin PTA recommended Lipitor 40 mg at discharge.  HTN stable during admission and recommended long-term BP goal normotensive range.  A1c 9.1 and per notes, patient had stopped all home diabetic medications due to hypoglycemia which was confirmed during hospital admission with BG 66.  Recommended continued PCP follow-up for DM management.  Patient was not on antithrombotic PTA and recommended DAPT for 3 weeks and then aspirin alone.  Also endorses history of migraine and did increase Topamax dosage during hospital admission with possible consideration of following up with Dr. Jaynee Eagles as outpatient for continued management.  Patient was discharged to [facility] for continued PT/OT/ST.     ROS:   14 system review of systems performed and negative with exception of joint pain, joint swelling, aching muscles and muscle cramps  PMH:  Past Medical History:  Diagnosis Date  . Arthritis    fingers  . Asthma   . Diabetes mellitus without complication ([place]) 1-95-09   hx. NIDDM-dx. 3 weeks ago  . Headache    migraines - none since stroke  .  HIV (human immunodeficiency virus infection) ([place])   . Hyperlipidemia    hx  . Seasonal allergies   . Stroke ([place]) 02/18/2018   Residual Left side weakness and speech issues  . Ulcerative colitis ([place])   . Wears contact lenses   . Wears partial dentures    upper    PSH:  Past Surgical History:  Procedure Laterality Date  . BUNIONECTOMY     bilateral and toe nails of big toes removed  . CESAREAN SECTION  1997/2006  . CHOLECYSTECTOMY N/A 12/01/2012   Procedure: LAPAROSCOPIC CHOLECYSTECTOMY;  Surgeon: Stark Klein, MD;  Location: [facility];  Service: General;  Laterality: N/A;  . COLONOSCOPY W/ POLYPECTOMY    . EYE SURGERY  Bilateral 2013  . MULTIPLE TOOTH EXTRACTIONS    . RADIOLOGY WITH ANESTHESIA Left 09/09/2018   Procedure: MRI WITH ANESTHESIA LEFT SHOULDER WITH CONTRAST;  Surgeon: Radiologist, Medication, MD;  Location: [place];  Service: Radiology;  Laterality: Left;  . SHOULDER ARTHROSCOPY WITH ROTATOR CUFF REPAIR AND SUBACROMIAL DECOMPRESSION Left 11/30/2018   Procedure: SHOULDER ARTHROSCOPY WITH SUBACROMIAL DECOMPRESSION AND DISTAL CLAVICAL EXCISION;  Surgeon: Thornton Park, MD;  Location: [place];  Service: Orthopedics;  Laterality: Left;  Diabetic - oral meds  . TONSILLECTOMY    . TUBAL LIGATION      Social History:  Social History   Socioeconomic History  . Marital status: Legally Separated    Spouse name: Not on file  . Number of children: Not on file  . Years of education: Not on file  . Highest education level: Not on file  Occupational History    Comment: disabled  Tobacco Use  . Smoking status: Never Smoker  . Smokeless tobacco: Never Used  Substance and Sexual Activity  . Alcohol use: Not Currently    Alcohol/week: 0.0 standard drinks  . Drug use: No  . Sexual activity: Not Currently    Partners: Male    Comment: offered condoms  Other Topics Concern  . Not on file  Social History Narrative  . Not on file   Social Determinants of Health   Financial Resource Strain:   . Difficulty of Paying Living Expenses:   Food Insecurity:   . Worried About Charity fundraiser in the Last Year:   . Arboriculturist in the Last Year:   Transportation Needs:   . Film/video editor (Medical):   Marland Kitchen Lack of Transportation (Non-Medical):   Physical Activity:   . Days of Exercise per Week:   . Minutes of Exercise per Session:   Stress:   . Feeling of Stress :   Social Connections:   . Frequency of Communication with Friends and Family:   . Frequency of Social Gatherings with Friends and Family:   . Attends Religious Services:   . Active Member of Clubs or Organizations:     . Attends Archivist Meetings:   Marland Kitchen Marital Status:   Intimate Partner Violence:   . Fear of Current or Ex-Partner:   . Emotionally Abused:   Marland Kitchen Physically Abused:   . Sexually Abused:     Family History:  Family History  Problem Relation Age of Onset  . Hypertension Mother   . Diabetes Mother   . Cancer Father        lung ca  . Seizures Daughter        81yr    Medications:   Current Outpatient Medications on File Prior to Visit  Medication Sig Dispense Refill  .  aspirin EC 81 MG EC tablet Take 1 tablet (81 mg total) by mouth daily.    Marland Kitchen atorvastatin (LIPITOR) 40 MG tablet TAKE 1 TABLET DAILY AT 6 P.M. 90 tablet 0  . BIKTARVY 50-200-25 MG TABS tablet TAKE ONE TABLET BY MOUTH DAILY 30 tablet 5  . cetirizine (ZYRTEC) 10 MG tablet Take 10 mg by mouth daily.    . fluticasone (FLONASE) 50 MCG/ACT nasal spray Place 1 spray into both nostrils daily as needed for allergies or rhinitis.    Marland Kitchen FREESTYLE LITE test strip TEST BLOOD SUGAR TWICE DAILY AS DIRECTED 100 each 8  . gabapentin (NEURONTIN) 100 MG capsule 100 mg morning, 100 mg afternoon, 300 mg at bedtime 450 capsule 0  . glipiZIDE (GLUCOTROL) 10 MG tablet TAKE 1 TABLET TWICE A DAY BEFORE MEALS 180 tablet 0  . glucose 4 GM chewable tablet Chew 1 tablet (4 g total) by mouth as needed for low blood sugar. 30 tablet 3  . KLOR-CON M20 20 MEQ tablet TAKE 1 TABLET DAILY 90 tablet 0  . mirtazapine (REMERON) 15 MG tablet Take 1 tablet (15 mg total) by mouth at bedtime. 90 tablet 3  . ondansetron (ZOFRAN) 4 MG tablet Take 1 tablet (4 mg total) by mouth every 8 (eight) hours as needed for nausea or vomiting. 30 tablet 0  . sitaGLIPtin (JANUVIA) 100 MG tablet Take 1 tablet (100 mg total) by mouth daily. 90 tablet 1  . topiramate (TOPAMAX) 100 MG tablet TAKE 1 TABLET AT BEDTIME AS NEEDED 90 tablet 1  . tiZANidine (ZANAFLEX) 4 MG tablet Take 1 tablet (4 mg total) by mouth at bedtime as needed for muscle spasms. (Patient not taking:  Reported on 11/02/2019) 20 tablet 0   No current facility-administered medications on file prior to visit.    Allergies:   Allergies  Allergen Reactions  . Azithromycin Itching    Muscle aches  . Erythromycin Nausea And Vomiting  . Penicillins Nausea And Vomiting    Did it involve swelling of the face/tongue/throat, SOB, or low BP? No Did it involve sudden or severe rash/hives, skin peeling, or any reaction on the inside of your mouth or nose? No Did you need to seek medical attention at a hospital or doctor's office? No When did it last happen?childhood allergy If all above answers are "NO", may proceed with cephalosporin use.   . Tetracycline Nausea And Vomiting     Physical Exam  Vitals:   11/02/19 0729  BP: (!) 145/78  Pulse: 89  Temp: 98.2 F (36.8 C)  Weight: 155 lb (70.3 kg)  Height: 5' 2"  (1.575 m)   Body mass index is 28.35 kg/m. No exam data present  General: well developed, well nourished, pleasant middle-aged African-American female, seated, in no evident distress Head: head normocephalic and atraumatic.   Neck: supple with no carotid or supraclavicular bruits Cardiovascular: regular rate and rhythm, no murmurs Musculoskeletal: no deformity Skin:  no rash/petichiae Vascular:  Normal pulses all extremities  Neurologic Exam Mental Status: Awake and fully alert.  Occasional word hesitancy.  Oriented to place and time. Recent and remote memory intact. Attention span, concentration and fund of knowledge appropriate. Mood and affect appropriate.  Cranial Nerves: Pupils equal, briskly reactive to light. Extraocular movements full without nystagmus. Visual fields full to confrontation. Hearing intact. Facial sensation intact. Face, tongue, palate moves normally and symmetrically.  Motor: Normal bulk and tone.  Normal strength in all tested extremities except slightly decreased left left grip strength and dexterity  Sensory.:  Intact to light touch, vibratory  and pinprick sensation Coordination: Rapid alternating movements slightly decreased left hand. Finger-to-nose and heel-to-shin performed accurately.  Orbits right arm over left arm.  Decreased left hand dexterity Gait and Station: Arises from chair without difficulty. Stance is normal. Gait demonstrates normal stride length and balance without use of assistive device Reflexes: 1+ and symmetric. Toes downgoing.          ASSESSMENT: [name] is a 64 y.o. year old female here with right BG infarct on 02/18/2018 secondary to small vessel disease. Vascular risk factors include HLD and DM.  Recovered well from a stroke standpoint with residual mild LUE weakness and mild dysarthria.    PLAN: -Continue aspirin 81 mg daily  and Lipitor 40 mg for secondary stroke prevention -F/u with PCP regarding your HLD and DM management -continue to monitor BP at home along with glucose levels -Advised to continue to follow with PCP for neuropathy evaluation -referral will need to be placed if PCP wishes further neurology evaluation -Increased marital stressors with likely underlying depression and highly encouraged counseling -she will consider.  Advised underlying stressors with depression could be contributing to short-term memory complaints.  Advised to continue to follow with PCP for further evaluation and potential need of depression treatment in the future if indicated -Maintain strict control of hypertension with blood pressure goal below 130/90, diabetes with hemoglobin A1c goal below 6.5% and cholesterol with LDL cholesterol (bad cholesterol) goal below 70 mg/dL. I also advised the patient to eat a healthy diet with plenty of whole grains, cereals, fruits and vegetables, exercise regularly and maintain ideal body weight.  Overall stable from a stroke standpoint and recommend follow-up as needed   I spent 33 minutes of face-to-face and non-face-to-face time with patient.  This included previsit  chart review, lab review, study review, order entry, electronic health record documentation, patient education regarding prior stroke, residual deficits, new onset neuropathy pain, importance of managing stroke risk factors and answered all questions to patient satisfaction    Venancio Poisson, AGNP-BC  [facility] [address] [place], [place] [address]  Phone [phone] Fax [phone] Note: This document was prepared with digital dictation and possible smart phrase technology. Any transcriptional errors that result from this process are unintentional.

## 2019-11-02 NOTE — Patient Instructions (Signed)
Your Plan:  Continue aspirin 81 mg daily and atorvastatin 40 mg daily for secondary stroke prevention  Continue to follow with your PCP for blood pressure, cholesterol and diabetes management  Continue to follow with your PCP for ongoing evaluation of neuropathy and nerve pain    Overall stable from stroke standpoint recommend follow-up as needed     Thank you for coming to see [place] at [facility]. I hope we have been able to provide you high quality care today.  You may receive a patient satisfaction survey over the next few weeks. We would appreciate your feedback and comments so that we may continue to improve ourselves and the health of our patients.

## 2019-11-03 LAB — CBC WITH DIFFERENTIAL/PLATELET
Absolute Monocytes: 441 cells/uL (ref 200–950)
Basophils Absolute: 9 cells/uL (ref 0–200)
Basophils Relative: 0.2 %
Eosinophils Absolute: 108 cells/uL (ref 15–500)
Eosinophils Relative: 2.4 %
HCT: 34.2 % — ABNORMAL LOW (ref 35.0–45.0)
Hemoglobin: 11.2 g/dL — ABNORMAL LOW (ref 11.7–15.5)
Lymphs Abs: 1733 cells/uL (ref 850–3900)
MCH: 30.5 pg (ref 27.0–33.0)
MCHC: 32.7 g/dL (ref 32.0–36.0)
MCV: 93.2 fL (ref 80.0–100.0)
MPV: 10.3 fL (ref 7.5–12.5)
Monocytes Relative: 9.8 %
Neutro Abs: 2210 cells/uL (ref 1500–7800)
Neutrophils Relative %: 49.1 %
Platelets: 229 10*3/uL (ref 140–400)
RBC: 3.67 10*6/uL — ABNORMAL LOW (ref 3.80–5.10)
RDW: 12.8 % (ref 11.0–15.0)
Total Lymphocyte: 38.5 %
WBC: 4.5 10*3/uL (ref 3.8–10.8)

## 2019-11-03 LAB — COMPREHENSIVE METABOLIC PANEL
AG Ratio: 1.6 (calc) (ref 1.0–2.5)
ALT: 22 U/L (ref 6–29)
AST: 21 U/L (ref 10–35)
Albumin: 4 g/dL (ref 3.6–5.1)
Alkaline phosphatase (APISO): 104 U/L (ref 37–153)
BUN/Creatinine Ratio: 18 (calc) (ref 6–22)
BUN: 18 mg/dL (ref 7–25)
CO2: 24 mmol/L (ref 20–32)
Calcium: 9 mg/dL (ref 8.6–10.4)
Chloride: 112 mmol/L — ABNORMAL HIGH (ref 98–110)
Creat: 1.01 mg/dL — ABNORMAL HIGH (ref 0.50–0.99)
Globulin: 2.5 g/dL (calc) (ref 1.9–3.7)
Glucose, Bld: 180 mg/dL — ABNORMAL HIGH (ref 65–99)
Potassium: 4.2 mmol/L (ref 3.5–5.3)
Sodium: 142 mmol/L (ref 135–146)
Total Bilirubin: 0.2 mg/dL (ref 0.2–1.2)
Total Protein: 6.5 g/dL (ref 6.1–8.1)

## 2019-11-03 LAB — HIV-1 RNA QUANT-NO REFLEX-BLD
HIV 1 RNA Quant: <20 copies/mL
HIV-1 RNA Quant, Log: <1.3 Log copies/mL

## 2019-11-03 LAB — LIPID PANEL
Cholesterol: 122 mg/dL (ref <200)
HDL: 42 mg/dL — ABNORMAL LOW (ref >=50)
LDL Cholesterol (Calc): 60 mg/dL (calc)
Non-HDL Cholesterol (Calc): 80 mg/dL (calc) (ref <130)
Total CHOL/HDL Ratio: 2.9 (calc) (ref <5.0)
Triglycerides: 110 mg/dL (ref <150)

## 2019-11-03 LAB — RPR: RPR Ser Ql: NONREACTIVE

## 2019-11-15 ENCOUNTER — Encounter: Payer: BC Managed Care – PPO | Admitting: Infectious Diseases

## 2019-11-15 ENCOUNTER — Ambulatory Visit (INDEPENDENT_AMBULATORY_CARE_PROVIDER_SITE_OTHER): Payer: BC Managed Care – PPO | Admitting: *Deleted

## 2019-11-15 DIAGNOSIS — E538 Deficiency of other specified B group vitamins: Secondary | ICD-10-CM

## 2019-11-15 MED ORDER — CYANOCOBALAMIN 1000 MCG/ML IJ SOLN
1000.0000 ug | Freq: Once | INTRAMUSCULAR | Status: AC
  Administered 2019-11-15: 1000 ug via INTRAMUSCULAR

## 2019-11-15 NOTE — Progress Notes (Signed)
Per orders of Webb Silversmith, NP, injection of Vit B12 given by Nyoka Cowden, Minard Millirons M. Patient tolerated injection well.

## 2019-11-21 ENCOUNTER — Other Ambulatory Visit: Payer: Self-pay

## 2019-11-21 ENCOUNTER — Ambulatory Visit (INDEPENDENT_AMBULATORY_CARE_PROVIDER_SITE_OTHER): Payer: BC Managed Care – PPO | Admitting: Infectious Diseases

## 2019-11-21 ENCOUNTER — Encounter: Payer: Self-pay | Admitting: Infectious Diseases

## 2019-11-21 DIAGNOSIS — B20 Human immunodeficiency virus [HIV] disease: Secondary | ICD-10-CM | POA: Diagnosis not present

## 2019-11-21 DIAGNOSIS — G629 Polyneuropathy, unspecified: Secondary | ICD-10-CM

## 2019-11-21 MED ORDER — BIKTARVY 50-200-25 MG PO TABS: 1.0000 | ORAL_TABLET | Freq: Every day | ORAL | 5 refills | Status: DC

## 2019-11-21 NOTE — Progress Notes (Signed)
Patient: [name]  DOB: Jun 05, 1956 MRN: [id number] PCP: Jearld Fenton, NP    Patient Active Problem List   Diagnosis Date Noted  . Neuropathy 11/22/2019  . Migraine 02/23/2018  . HIV disease ([place])   . Stroke (cerebrum) ([place]) - R basal ganglia d/t small vessel dz 02/18/2018  . HLD (hyperlipidemia) 08/02/2014  . Diabetes mellitus type 2, uncontrolled, with complications ([place]) 50/93/2671  . Ulcerative colitis ([place]) 06/26/2008  . Depression, recurrent ([place]) 04/04/2008     Subjective:  Brief ID:  [name] is a 64 y.o. female with HIV infection diagnosed August 2019 on routine screening after having had a stroke. VL 118,000 copies, CD4 500.  HIV Risk: heterosexual.  OI Hx: none  Previous Regimens:   Biktarvy 02-2018: suppressed   Resistance Testing:   02-2018 wildtype virus      Chief Complaint  Patient presents with  . Follow-up    B20 pt stated--right wrist and feet having tingling sensation/pain. pt see primary care .      HPI/ROS:  [name] has been doing well since our last visit but has a few concerns today about her health.   She has continued on her Biktarvy without any missed doses. She knows this will keep her condition under good control and protect her from any bad things associated with progressive HIV. She feels that she has dealt with much of what was causing her personal strife as it relates to this diagnosis for her. She does wonder if she possibly was at risk with previous blood transfusions in early 2000s.  She has no side effects from the [place] from what she can tell. No problems with access to medication.   She states her diabetes has gotten a little worse since COVID pandemic changes - she feels it is diet related and getting on top of things again to gain better control.   She has noticed a lot of neuropathy in hands and feet - described to be tingling burning. She recently learned her B12 was low and has started  supplemental injections. The gabapentin does not help at all at the current dose and feels she cannot do a higher dose as it makes her very sleepy to the point she cannot comfortably drive or function. She does not drink alcohol.   She also suffers from carpal tunnel. The braces she uses at night do offer some relief to early morning hand numbness but she can't drive with them on so frequently does not use them during the day. She feels it throbs at her wrist at the end of the day.    Review of Systems  Constitutional: Negative for appetite change, chills, fatigue, fever and unexpected weight change.  HENT: Negative for mouth sores, sore throat and trouble swallowing.   Eyes: Negative for pain and visual disturbance.  Respiratory: Negative for cough and shortness of breath.   Cardiovascular: Negative for chest pain.  Gastrointestinal: Negative for abdominal pain, diarrhea and nausea.  Genitourinary: Negative for dysuria, menstrual problem and pelvic pain.  Musculoskeletal: Negative for back pain and neck pain.  Skin: Negative for color change and rash.  Neurological: Positive for numbness. Negative for dizziness, weakness and headaches.  Hematological: Negative for adenopathy.  Psychiatric/Behavioral: Negative for dysphoric mood. The patient is not nervous/anxious.      Past Medical History:  Diagnosis Date  . Arthritis    fingers  . Asthma   . Diabetes mellitus without complication ([place]) 2-45-80   hx. NIDDM-dx.  3 weeks ago  . Headache    migraines - none since stroke  . HIV (human immunodeficiency virus infection) ([place])   . Hyperlipidemia    hx  . Seasonal allergies   . Stroke ([place]) 02/18/2018   Residual Left side weakness and speech issues  . Ulcerative colitis ([place])   . Wears contact lenses   . Wears partial dentures    upper    Outpatient Medications Prior to Visit  Medication Sig Dispense Refill  . aspirin EC 81 MG EC tablet Take 1 tablet (81 mg total) by mouth daily.     Marland Kitchen atorvastatin (LIPITOR) 40 MG tablet TAKE 1 TABLET DAILY AT 6 P.M. 90 tablet 0  . cetirizine (ZYRTEC) 10 MG tablet Take 10 mg by mouth daily.    . fluticasone (FLONASE) 50 MCG/ACT nasal spray Place 1 spray into both nostrils daily as needed for allergies or rhinitis.    Marland Kitchen FREESTYLE LITE test strip TEST BLOOD SUGAR TWICE DAILY AS DIRECTED 100 each 8  . gabapentin (NEURONTIN) 100 MG capsule 100 mg morning, 100 mg afternoon, 300 mg at bedtime 450 capsule 0  . glipiZIDE (GLUCOTROL) 10 MG tablet TAKE 1 TABLET TWICE A DAY BEFORE MEALS 180 tablet 0  . KLOR-CON M20 20 MEQ tablet TAKE 1 TABLET DAILY 90 tablet 0  . mirtazapine (REMERON) 15 MG tablet Take 1 tablet (15 mg total) by mouth at bedtime. 90 tablet 3  . sitaGLIPtin (JANUVIA) 100 MG tablet Take 1 tablet (100 mg total) by mouth daily. 90 tablet 1  . topiramate (TOPAMAX) 100 MG tablet TAKE 1 TABLET AT BEDTIME AS NEEDED 90 tablet 1  . BIKTARVY 50-200-25 MG TABS tablet TAKE ONE TABLET BY MOUTH DAILY 30 tablet 5  . glucose 4 GM chewable tablet Chew 1 tablet (4 g total) by mouth as needed for low blood sugar. (Patient not taking: Reported on 11/21/2019) 30 tablet 3   No facility-administered medications prior to visit.     Allergies  Allergen Reactions  . Azithromycin Itching    Muscle aches  . Erythromycin Nausea And Vomiting  . Penicillins Nausea And Vomiting    Did it involve swelling of the face/tongue/throat, SOB, or low BP? No Did it involve sudden or severe rash/hives, skin peeling, or any reaction on the inside of your mouth or nose? No Did you need to seek medical attention at a hospital or doctor's office? No When did it last happen?childhood allergy If all above answers are "NO", may proceed with cephalosporin use.   . Tetracycline Nausea And Vomiting    Social History   Tobacco Use  . Smoking status: Never Smoker  . Smokeless tobacco: Never Used  Substance Use Topics  . Alcohol use: Not Currently    Alcohol/week:  0.0 standard drinks  . Drug use: No    Objective:   Vitals:   11/21/19 1447  BP: 122/74  Pulse: 93  Temp: 98.3 F (36.8 C)  SpO2: 98%  Weight: 151 lb (68.5 kg)  Height: 5' 2"  (1.575 m)   Body mass index is 27.62 kg/m.  Physical Exam Constitutional:      Appearance: Normal appearance. She is well-developed.     Comments: Seated comfortably in chair.   HENT:     Mouth/Throat:     Mouth: No oral lesions.     Dentition: Normal dentition. No dental abscesses.     Pharynx: No oropharyngeal exudate.  Cardiovascular:     Rate and Rhythm: Normal rate and regular  rhythm.     Heart sounds: Normal heart sounds.  Pulmonary:     Effort: Pulmonary effort is normal.     Breath sounds: Normal breath sounds.  Abdominal:     General: There is no distension.     Palpations: Abdomen is soft.     Tenderness: There is no abdominal tenderness.  Lymphadenopathy:     Cervical: No cervical adenopathy.  Skin:    General: Skin is warm and dry.     Findings: No rash.  Neurological:     Mental Status: She is alert and oriented to person, place, and time.  Psychiatric:        Judgment: Judgment normal.     Comments: In good spirits today and engaged in care discussion     Lab Results: HIV 1 RNA Quant (copies/mL)  Date Value  10/31/2019 <20 NOT DETECTED  04/18/2019 <20 NOT DETECTED  09/27/2018 <20 NOT DETECTED   CD4 T Cell Abs (/uL)  Date Value  10/31/2019 886  04/18/2019 937  09/27/2018 770    Lab Results  Component Value Date   WBC 4.5 10/31/2019   HGB 11.2 (L) 10/31/2019   HCT 34.2 (L) 10/31/2019   MCV 93.2 10/31/2019   PLT 229 10/31/2019    Lab Results  Component Value Date   CREATININE 1.01 (H) 10/31/2019   BUN 18 10/31/2019   NA 142 10/31/2019   K 4.2 10/31/2019   CL 112 (H) 10/31/2019   CO2 24 10/31/2019    Lab Results  Component Value Date   ALT 22 10/31/2019   AST 21 10/31/2019   ALKPHOS 123 03/01/2019   BILITOT 0.2 10/31/2019     Assessment & Plan:     Problem List Items Addressed This Visit      Unprioritized   HIV disease ([place]) (Chronic)    She has done exceptionally well on her [place]. Her viral loads have been sequentially undetectable and CD4 has reconstituted to a normal range. Will continue and have her return for routine follow up in 6 months.       Relevant Medications   bictegravir-emtricitabine-tenofovir AF (BIKTARVY) 50-200-25 MG TABS tablet   Neuropathy    Multifactorial with B12 deficiency, diabetes and HIV. Her HIV is well controled. I am hopeful correcting her diabetes and vitamin deficiencies she will have some improvement.  Discussed considering alpha lipoic acid supplementation [phone] mg daily to see if that helps.  I asked her to also discuss with her PCP about possibility of Lyrica or Cymbalta instead to see if that helps.         Return in about 6 months (around 05/23/2020).   Janene Madeira, MSN, NP-C [facility] [place] Pager: [phone] Office: [phone]  11/22/19  11:51 AM

## 2019-11-21 NOTE — Patient Instructions (Addendum)
Alpha Lipoic Acid 600 - 1800 mg --> this supplement may offer you some relief from your neuropathy and won't make you sleepy. Consider starting 600 mg once a day and then increase to 1200 mg once a day.  Please continue your B12 injections also   Capsacin cream on your feet is another option to try, but be sure to wipe off the cream from your hands after you apply it or use gloves. It has a warming effect so start with a little.   Please call Joycelyn Schmid [phone] to schedule a dental appointment   Please come back in 6 months for another follow up - you can do labs same day if you like.

## 2019-11-22 DIAGNOSIS — G629 Polyneuropathy, unspecified: Secondary | ICD-10-CM | POA: Insufficient documentation

## 2019-11-22 NOTE — Assessment & Plan Note (Signed)
Multifactorial with B12 deficiency, diabetes and HIV. Her HIV is well controled. I am hopeful correcting her diabetes and vitamin deficiencies she will have some improvement.  Discussed considering alpha lipoic acid supplementation [phone] mg daily to see if that helps.  I asked her to also discuss with her PCP about possibility of Lyrica or Cymbalta instead to see if that helps.

## 2019-11-22 NOTE — Progress Notes (Signed)
Please reach out to pt and have her set up an appt with me to discuss treatment options if she feels like the gabapentin makes her too drowsy.

## 2019-11-22 NOTE — Assessment & Plan Note (Signed)
She has done exceptionally well on her Biktarvy. Her viral loads have been sequentially undetectable and CD4 has reconstituted to a normal range. Will continue and have her return for routine follow up in 6 months.

## 2019-11-24 ENCOUNTER — Other Ambulatory Visit: Payer: Self-pay

## 2019-11-24 ENCOUNTER — Ambulatory Visit (INDEPENDENT_AMBULATORY_CARE_PROVIDER_SITE_OTHER): Payer: BC Managed Care – PPO | Admitting: Internal Medicine

## 2019-11-24 ENCOUNTER — Encounter: Payer: Self-pay | Admitting: Internal Medicine

## 2019-11-24 VITALS — BP 122/54 | HR 89 | Temp 98.6°F | Ht 62.0 in | Wt 158.0 lb

## 2019-11-24 DIAGNOSIS — M25471 Effusion, right ankle: Secondary | ICD-10-CM

## 2019-11-24 DIAGNOSIS — G629 Polyneuropathy, unspecified: Secondary | ICD-10-CM

## 2019-11-24 DIAGNOSIS — M25571 Pain in right ankle and joints of right foot: Secondary | ICD-10-CM

## 2019-11-24 NOTE — Progress Notes (Signed)
Subjective:    Patient ID: [name], female    DOB: 1956/07/01, 64 y.o.   MRN: [id number]  HPI  Patient presents to the clinic today to discuss her neuropathy.  She is currently taking Gabapentin but feels it is causing some residual lightheadedness in the morning. She reports the lightheadedness will last for a few minutes and then resolve. She denies vision changes, near syncope, chest pain or SOB. She does not feel like the Gabapentin is making her sedated during the day. She reports her neuropathy is good in the morning, but gets worse throughout the day. Her last A1C was 6.9 %, 10/2019. She is getting B12 injections for B12 deficiency. Her thyroid was checked 1 month ago, normal. She has not tried anything additional OTC for this.  She also c/o right ankle swelling. This started 1 week ago after she stepped wrong and rolled her ankle. She denies pain and reports the swelling seems to come and go. She has not tried anything OTC for this.  Review of Systems  Past Medical History:  Diagnosis Date  . Arthritis    fingers  . Asthma   . Diabetes mellitus without complication ([place]) 9-73-53   hx. NIDDM-dx. 3 weeks ago  . Headache    migraines - none since stroke  . HIV (human immunodeficiency virus infection) ([place])   . Hyperlipidemia    hx  . Seasonal allergies   . Stroke ([place]) 02/18/2018   Residual Left side weakness and speech issues  . Ulcerative colitis ([place])   . Wears contact lenses   . Wears partial dentures    upper    Current Outpatient Medications  Medication Sig Dispense Refill  . aspirin EC 81 MG EC tablet Take 1 tablet (81 mg total) by mouth daily.    Marland Kitchen atorvastatin (LIPITOR) 40 MG tablet TAKE 1 TABLET DAILY AT 6 P.M. 90 tablet 0  . bictegravir-emtricitabine-tenofovir AF (BIKTARVY) 50-200-25 MG TABS tablet Take 1 tablet by mouth daily. 30 tablet 5  . cetirizine (ZYRTEC) 10 MG tablet Take 10 mg by mouth daily.    . fluticasone (FLONASE) 50 MCG/ACT nasal spray  Place 1 spray into both nostrils daily as needed for allergies or rhinitis.    Marland Kitchen FREESTYLE LITE test strip TEST BLOOD SUGAR TWICE DAILY AS DIRECTED 100 each 8  . gabapentin (NEURONTIN) 100 MG capsule 100 mg morning, 100 mg afternoon, 300 mg at bedtime 450 capsule 0  . glipiZIDE (GLUCOTROL) 10 MG tablet TAKE 1 TABLET TWICE A DAY BEFORE MEALS 180 tablet 0  . glucose 4 GM chewable tablet Chew 1 tablet (4 g total) by mouth as needed for low blood sugar. (Patient not taking: Reported on 11/21/2019) 30 tablet 3  . KLOR-CON M20 20 MEQ tablet TAKE 1 TABLET DAILY 90 tablet 0  . mirtazapine (REMERON) 15 MG tablet Take 1 tablet (15 mg total) by mouth at bedtime. 90 tablet 3  . sitaGLIPtin (JANUVIA) 100 MG tablet Take 1 tablet (100 mg total) by mouth daily. 90 tablet 1  . topiramate (TOPAMAX) 100 MG tablet TAKE 1 TABLET AT BEDTIME AS NEEDED 90 tablet 1   No current facility-administered medications for this visit.    Allergies  Allergen Reactions  . Azithromycin Itching    Muscle aches  . Erythromycin Nausea And Vomiting  . Penicillins Nausea And Vomiting    Did it involve swelling of the face/tongue/throat, SOB, or low BP? No Did it involve sudden or severe rash/hives, skin peeling, or any  reaction on the inside of your mouth or nose? No Did you need to seek medical attention at a hospital or doctor's office? No When did it last happen?childhood allergy If all above answers are "NO", may proceed with cephalosporin use.   . Tetracycline Nausea And Vomiting    Family History  Problem Relation Age of Onset  . Hypertension Mother   . Diabetes Mother   . Cancer Father        lung ca  . Seizures Daughter        74yr    Social History   Socioeconomic History  . Marital status: Legally Separated    Spouse name: Not on file  . Number of children: Not on file  . Years of education: Not on file  . Highest education level: Not on file  Occupational History    Comment: disabled  Tobacco  Use  . Smoking status: Never Smoker  . Smokeless tobacco: Never Used  Substance and Sexual Activity  . Alcohol use: Not Currently    Alcohol/week: 0.0 standard drinks  . Drug use: No  . Sexual activity: Not Currently    Partners: Male    Comment: offered condoms  Other Topics Concern  . Not on file  Social History Narrative  . Not on file   Social Determinants of Health   Financial Resource Strain:   . Difficulty of Paying Living Expenses:   Food Insecurity:   . Worried About RCharity fundraiserin the Last Year:   . RArboriculturistin the Last Year:   Transportation Needs:   . LFilm/video editor(Medical):   .Marland KitchenLack of Transportation (Non-Medical):   Physical Activity:   . Days of Exercise per Week:   . Minutes of Exercise per Session:   Stress:   . Feeling of Stress :   Social Connections:   . Frequency of Communication with Friends and Family:   . Frequency of Social Gatherings with Friends and Family:   . Attends Religious Services:   . Active Member of Clubs or Organizations:   . Attends CArchivistMeetings:   .Marland KitchenMarital Status:   Intimate Partner Violence:   . Fear of Current or Ex-Partner:   . Emotionally Abused:   .Marland KitchenPhysically Abused:   . Sexually Abused:      Constitutional: Denies fever, malaise, fatigue, headache or abrupt weight changes.  Respiratory: Denies difficulty breathing, shortness of breath, cough or sputum production.   Cardiovascular: Denies chest pain, chest tightness, palpitations or swelling in the hands or feet.  Musculoskeletal: Pt reports right ankle pain and swelling. Denies decrease in range of motion, difficulty with gait, muscle pain.  Skin: Denies redness, rashes, lesions or ulcercations.  Neurological: Pt reports neuropathic pain in feet, intermittent lightheadedness. Denies dizziness, difficulty with memory, difficulty with speech or problems with balance and coordination.    No other specific complaints in a  complete review of systems (except as listed in HPI above).     Objective:   Physical Exam  BP (!) 122/54 (BP Location: Left Arm, Patient Position: Sitting, Cuff Size: Normal)   Pulse 89   Temp 98.6 F (37 C) (Other (Comment))   Ht 5' 2"  (1.575 m)   Wt 158 lb (71.7 kg)   SpO2 97%   BMI 28.90 kg/m   Wt Readings from Last 3 Encounters:  11/21/19 151 lb (68.5 kg)  11/02/19 155 lb (70.3 kg)  10/10/19 154 lb (69.9 kg)  General: Appears her stated age, well developed, well nourished in NAD. Skin: Warm, dry and intact. No rashes ulcerations noted. Cardiovascular: Normal rate and rhythm.  Pulmonary/Chest: Normal effort and positive vesicular breath sounds. No respiratory distress. No wheezes, rales or ronchi noted.  Musculoskeletal: Normal flexion, extension and rotation of the right ankle. 1+ swelling over the lateral malleolus. No pain with palpation of the right ankle. No difficulty with gait. Neurological: Alert and oriented. Sensation decreased in BLE.   BMET    Component Value Date/Time   NA 142 10/31/2019 1436   NA 138 06/23/2014 0503   K 4.2 10/31/2019 1436   K 4.2 06/23/2014 0503   CL 112 (H) 10/31/2019 1436   CL 104 06/23/2014 0503   CO2 24 10/31/2019 1436   CO2 27 06/23/2014 0503   GLUCOSE 180 (H) 10/31/2019 1436   GLUCOSE 246 (H) 06/23/2014 0503   BUN 18 10/31/2019 1436   BUN 17 06/23/2014 0503   CREATININE 1.01 (H) 10/31/2019 1436   CALCIUM 9.0 10/31/2019 1436   CALCIUM 8.7 06/23/2014 0503   GFRNONAA >60 03/01/2019 1730   GFRNONAA 65 09/27/2018 1053   GFRAA >60 03/01/2019 1730   GFRAA 76 09/27/2018 1053    Lipid Panel     Component Value Date/Time   CHOL 122 10/31/2019 1436   CHOL 211 (H) 06/22/2014 1432   TRIG 110 10/31/2019 1436   TRIG 473 (H) 06/22/2014 1432   HDL 42 (L) 10/31/2019 1436   HDL 42 06/22/2014 1432   CHOLHDL 2.9 10/31/2019 1436   VLDL 20.2 02/14/2019 1043   VLDL SEE COMMENT 06/22/2014 1432   LDLCALC 60 10/31/2019 1436   LDLCALC  SEE COMMENT 06/22/2014 1432    CBC    Component Value Date/Time   WBC 4.5 10/31/2019 1436   RBC 3.67 (L) 10/31/2019 1436   HGB 11.2 (L) 10/31/2019 1436   HGB 12.2 06/23/2014 0503   HCT 34.2 (L) 10/31/2019 1436   HCT 37.2 06/23/2014 0503   PLT 229 10/31/2019 1436   PLT 251 06/23/2014 0503   MCV 93.2 10/31/2019 1436   MCV 89 06/23/2014 0503   MCH 30.5 10/31/2019 1436   MCHC 32.7 10/31/2019 1436   RDW 12.8 10/31/2019 1436   RDW 13.6 06/23/2014 0503   LYMPHSABS 1,733 10/31/2019 1436   LYMPHSABS 1.8 06/23/2014 0503   MONOABS 0.6 03/01/2019 1730   MONOABS 0.4 06/23/2014 0503   EOSABS 108 10/31/2019 1436   EOSABS 0.0 06/23/2014 0503   BASOSABS 9 10/31/2019 1436   BASOSABS 0.0 06/23/2014 0503    Hgb A1C Lab Results  Component Value Date   HGBA1C 6.9 (H) 10/10/2019           Assessment & Plan:  Neuropathy:  Will change Gabapentin to 200 mg 3 x day- let me know if this does not improve morning lightheadedness Discussed importance of blood sugar control Continue B12 injections Update me in 1 week and let me know how you are doing.  Right Ankle Pain and Swelling:  Likely sprain, clinically improving No indication for xray at this time Discussed RICE therapy  Return precautions discussed  Webb Silversmith, NP This visit occurred during the SARS-CoV-2 public health emergency.  Safety protocols were in place, including screening questions prior to the visit, additional usage of staff PPE, and extensive cleaning of exam room while observing appropriate contact time as indicated for disinfecting solutions.

## 2019-11-24 NOTE — Patient Instructions (Signed)
Neuropathic Pain Neuropathic pain is pain caused by damage to the nerves that are responsible for certain sensations in your body (sensory nerves). The pain can be caused by:  Damage to the sensory nerves that send signals to your spinal cord and brain (peripheral nervous system).  Damage to the sensory nerves in your brain or spinal cord (central nervous system). Neuropathic pain can make you more sensitive to pain. Even a minor sensation can feel very painful. This is usually a long-term condition that can be difficult to treat. The type of pain differs from person to person. It may:  Start suddenly (acute), or it may develop slowly and last for a long time (chronic).  Come and go as damaged nerves heal, or it may stay at the same level for years.  Cause emotional distress, loss of sleep, and a lower quality of life. What are the causes? The most common cause of this condition is diabetes. Many other diseases and conditions can also cause neuropathic pain. Causes of neuropathic pain can be classified as:  Toxic. This is caused by medicines and chemicals. The most common cause of toxic neuropathic pain is damage from cancer treatments (chemotherapy).  Metabolic. This can be caused by: ? Diabetes. This is the most common disease that damages the nerves. ? Lack of vitamin B from long-term alcohol abuse.  Traumatic. Any injury that cuts, crushes, or stretches a nerve can cause damage and pain. A common example is feeling pain after losing an arm or leg (phantom limb pain).  Compression-related. If a sensory nerve gets trapped or compressed for a long period of time, the blood supply to the nerve can be cut off.  Vascular. Many blood vessel diseases can cause neuropathic pain by decreasing blood supply and oxygen to nerves.  Autoimmune. This type of pain results from diseases in which the body's defense system (immune system) mistakenly attacks sensory nerves. Examples of autoimmune diseases  that can cause neuropathic pain include lupus and multiple sclerosis.  Infectious. Many types of viral infections can damage sensory nerves and cause pain. Shingles infection is a common cause of this type of pain.  Inherited. Neuropathic pain can be a symptom of many diseases that are passed down through families (genetic). What increases the risk? You are more likely to develop this condition if:  You have diabetes.  You smoke.  You drink too much alcohol.  You are taking certain medicines, including medicines that kill cancer cells (chemotherapy) or that treat immune system disorders. What are the signs or symptoms? The main symptom is pain. Neuropathic pain is often described as:  Burning.  Shock-like.  Stinging.  Hot or cold.  Itching. How is this diagnosed? No single test can diagnose neuropathic pain. It is diagnosed based on:  Physical exam and your symptoms. Your health care provider will ask you about your pain. You may be asked to use a pain scale to describe how bad your pain is.  Tests. These may be done to see if you have a high sensitivity to pain and to help find the cause and location of any sensory nerve damage. They include: ? Nerve conduction studies to test how well nerve signals travel through your sensory nerves (electrodiagnostic testing). ? Stimulating your sensory nerves through electrodes on your skin and measuring the response in your spinal cord and brain (somatosensory evoked potential).  Imaging studies, such as: ? X-rays. ? CT scan. ? MRI. How is this treated? Treatment for neuropathic pain may change   over time. You may need to try different treatment options or a combination of treatments. Some options include:  Treating the underlying cause of the neuropathy, such as diabetes, kidney disease, or vitamin deficiencies.  Stopping medicines that can cause neuropathy, such as chemotherapy.  Medicine to relieve pain. Medicines may  include: ? Prescription or over-the-counter pain medicine. ? Anti-seizure medicine. ? Antidepressant medicines. ? Pain-relieving patches that are applied to painful areas of skin. ? A medicine to numb the area (local anesthetic), which can be injected as a nerve block.  Transcutaneous nerve stimulation. This uses electrical currents to block painful nerve signals. The treatment is painless.  Alternative treatments, such as: ? Acupuncture. ? Meditation. ? Massage. ? Physical therapy. ? Pain management programs. ? Counseling. Follow these instructions at home: Medicines   Take over-the-counter and prescription medicines only as told by your health care provider.  Do not drive or use heavy machinery while taking prescription pain medicine.  If you are taking prescription pain medicine, take actions to prevent or treat constipation. Your health care provider may recommend that you: ? Drink enough fluid to keep your urine pale yellow. ? Eat foods that are high in fiber, such as fresh fruits and vegetables, whole grains, and beans. ? Limit foods that are high in fat and processed sugars, such as fried or sweet foods. ? Take an over-the-counter or prescription medicine for constipation. Lifestyle   Have a good support system at home.  Consider joining a chronic pain support group.  Do not use any products that contain nicotine or tobacco, such as cigarettes and e-cigarettes. If you need help quitting, ask your health care provider.  Do not drink alcohol. General instructions  Learn as much as you can about your condition.  Work closely with all your health care providers to find the treatment plan that works best for you.  Ask your health care provider what activities are safe for you.  Keep all follow-up visits as told by your health care provider. This is important. Contact a health care provider if:  Your pain treatments are not working.  You are having side effects  from your medicines.  You are struggling with tiredness (fatigue), mood changes, depression, or anxiety. Summary  Neuropathic pain is pain caused by damage to the nerves that are responsible for certain sensations in your body (sensory nerves).  Neuropathic pain may come and go as damaged nerves heal, or it may stay at the same level for years.  Neuropathic pain is usually a long-term condition that can be difficult to treat. Consider joining a chronic pain support group. This information is not intended to replace advice given to you by your health care provider. Make sure you discuss any questions you have with your health care provider. Document Revised: 10/21/2018 Document Reviewed: 07/17/2017 Elsevier Patient Education  2020 [employer]  

## 2019-11-25 ENCOUNTER — Telehealth: Payer: Self-pay | Admitting: *Deleted

## 2019-11-25 NOTE — Telephone Encounter (Signed)
Spoke with the pt and attempted to inform her of the message below.  Patient stated she did not miss the appts intentionally as she was not aware that she had to schedule the appts as she thought the office would do so for her.  Stated she has an appt on 5/19.  Message sent to PCP.

## 2019-11-25 NOTE — Telephone Encounter (Signed)
If she is going to miss them, she may want to consider oral B12. I wanted her to have them every 2 weeks for 2 months, then monthly for 4 months. I guess if she has missed we can try monthly and recheck, but if she isn't going to do it consistently, she needs to do oral B12 1000 mcg SL daily.

## 2019-11-25 NOTE — Telephone Encounter (Signed)
Called patient to confirm her nurse visit next week for a B-12 injection. Patient stated that she was suppose to get B-12 injections every two weeks for 8 weeks. Patient stated that she has missed several B-12 injections and wants to know how long should she continue the injections every two weeks?

## 2019-11-30 ENCOUNTER — Ambulatory Visit (INDEPENDENT_AMBULATORY_CARE_PROVIDER_SITE_OTHER): Payer: BC Managed Care – PPO

## 2019-11-30 DIAGNOSIS — E538 Deficiency of other specified B group vitamins: Secondary | ICD-10-CM

## 2019-11-30 MED ORDER — CYANOCOBALAMIN 1000 MCG/ML IJ SOLN
1000.0000 ug | Freq: Once | INTRAMUSCULAR | Status: AC
  Administered 2019-11-30: 1000 ug via INTRAMUSCULAR

## 2019-11-30 NOTE — Progress Notes (Signed)
Per orders of Allie Bossier, NP, in [facility] absence , injection of B12 given by Randall An. Patient tolerated injection well.

## 2019-12-08 ENCOUNTER — Other Ambulatory Visit: Payer: Self-pay

## 2019-12-08 ENCOUNTER — Encounter: Payer: Self-pay | Admitting: Family Medicine

## 2019-12-08 ENCOUNTER — Ambulatory Visit (INDEPENDENT_AMBULATORY_CARE_PROVIDER_SITE_OTHER): Payer: BC Managed Care – PPO | Admitting: Family Medicine

## 2019-12-08 VITALS — BP 130/80 | HR 88 | Temp 97.0°F | Wt 151.0 lb

## 2019-12-08 DIAGNOSIS — N390 Urinary tract infection, site not specified: Secondary | ICD-10-CM

## 2019-12-08 DIAGNOSIS — R350 Frequency of micturition: Secondary | ICD-10-CM

## 2019-12-08 DIAGNOSIS — R300 Dysuria: Secondary | ICD-10-CM

## 2019-12-08 LAB — POC URINALSYSI DIPSTICK (AUTOMATED)
Bilirubin, UA: NEGATIVE
Glucose, UA: NEGATIVE
Ketones, UA: NEGATIVE
Nitrite, UA: POSITIVE
Protein, UA: POSITIVE — AB
Spec Grav, UA: 1.025 (ref 1.010–1.025)
Urobilinogen, UA: 0.2 E.U./dL
pH, UA: 6 (ref 5.0–8.0)

## 2019-12-08 MED ORDER — CIPROFLOXACIN HCL 500 MG PO TABS: 500.0000 mg | ORAL_TABLET | Freq: Two times a day (BID) | ORAL | 0 refills | Status: DC

## 2019-12-08 NOTE — Assessment & Plan Note (Addendum)
With moderate R flank pain  Disc poss of kidney stone or pyelonephritis  Px high dose cipro and inc fluid intake If no improvement tomorrow would recommend CT scan  Pt agreed and will update tomorrow   Meds ordered this encounter  Medications  . ciprofloxacin (CIPRO) 500 MG tablet    Sig: Take 1 tablet (500 mg total) by mouth 2 (two) times daily.    Dispense:  14 tablet    Refill:  0

## 2019-12-08 NOTE — Patient Instructions (Signed)
Take the cipro as directed with food- first dose now and then 2nd dose tonight Them am and pm   Drink lots and lots of water   If suddenly worse/or fever or nausea - go to the ER   Call us in the am- if not significantly improved we may need to order a CT scan

## 2019-12-08 NOTE — Progress Notes (Signed)
Subjective:    Patient ID: [name], female    DOB: 04-05-1956, 64 y.o.   MRN: [id number]  This visit occurred during the SARS-CoV-2 public health emergency.  Safety protocols were in place, including screening questions prior to the visit, additional usage of staff PPE, and extensive cleaning of exam room while observing appropriate contact time as indicated for disinfecting solutions.    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 presents with urinary symptoms    Started 2 d ago  R pelvic area and flank  Got worse yesterday  It is a throbbing pain / sharp at times A lot of pressure to urinate   Some R sided flank/back pain   No nausea   Pain is fairly severe -up to 8/10 on pain scale   No positions relieve it   No blood seen  No odor to urine   UA: Leuk and nit and blood   Results for orders placed or performed in visit on 12/08/19  POCT Urinalysis Dipstick (Automated)  Result Value Ref Range   Color, UA yellow    Clarity, UA clear    Glucose, UA Negative Negative   Bilirubin, UA neg    Ketones, UA neg    Spec Grav, UA 1.025 1.010 - 1.025   Blood, UA large    pH, UA 6.0 5.0 - 8.0   Protein, UA Positive (A) Negative   Urobilinogen, UA 0.2 0.2 or 1.0 E.U./dL   Nitrite, UA positive    Leukocytes, UA Small (1+) (A) Negative     Patient Active Problem List   Diagnosis Date Noted  . Complicated UTI (urinary tract infection) 12/08/2019  . Neuropathy 11/22/2019  . Migraine 02/23/2018  . HIV disease ([place])   . Stroke (cerebrum) ([place]) - R basal ganglia d/t small vessel dz 02/18/2018  . HLD (hyperlipidemia) 08/02/2014  . Diabetes mellitus type 2, uncontrolled, with complications ([place]) 58/03/9832  . Ulcerative colitis ([place]) 06/26/2008  . Depression, recurrent ([place]) 04/04/2008   Past Medical History:  Diagnosis Date  . Arthritis    fingers  . Asthma   . Diabetes mellitus without complication ([place]) 03-07-04   hx. NIDDM-dx. 3 weeks ago  . Headache    migraines - none  since stroke  . HIV (human immunodeficiency virus infection) ([place])   . Hyperlipidemia    hx  . Seasonal allergies   . Stroke ([place]) 02/18/2018   Residual Left side weakness and speech issues  . Ulcerative colitis ([place])   . Wears contact lenses   . Wears partial dentures    upper   Past Surgical History:  Procedure Laterality Date  . BUNIONECTOMY     bilateral and toe nails of big toes removed  . CESAREAN SECTION  1997/2006  . CHOLECYSTECTOMY N/A 12/01/2012   Procedure: LAPAROSCOPIC CHOLECYSTECTOMY;  Surgeon: Stark Klein, MD;  Location: [facility];  Service: General;  Laterality: N/A;  . COLONOSCOPY W/ POLYPECTOMY    . EYE SURGERY Bilateral 2013  . MULTIPLE TOOTH EXTRACTIONS    . RADIOLOGY WITH ANESTHESIA Left 09/09/2018   Procedure: MRI WITH ANESTHESIA LEFT SHOULDER WITH CONTRAST;  Surgeon: Radiologist, Medication, MD;  Location: [facility];  Service: Radiology;  Laterality: Left;  . SHOULDER ARTHROSCOPY WITH ROTATOR CUFF REPAIR AND SUBACROMIAL DECOMPRESSION Left 11/30/2018   Procedure: SHOULDER ARTHROSCOPY WITH SUBACROMIAL DECOMPRESSION AND DISTAL CLAVICAL EXCISION;  Surgeon: Thornton Park, MD;  Location: [facility];  Service: Orthopedics;  Laterality: Left;  Diabetic - oral meds  . TONSILLECTOMY    .  TUBAL LIGATION     Social History   Tobacco Use  . Smoking status: Never Smoker  . Smokeless tobacco: Never Used  Substance Use Topics  . Alcohol use: Not Currently    Alcohol/week: 0.0 standard drinks  . Drug use: No   Family History  Problem Relation Age of Onset  . Hypertension Mother   . Diabetes Mother   . Cancer Father        lung ca  . Seizures Daughter        14yr   Allergies  Allergen Reactions  . Azithromycin Itching    Muscle aches  . Erythromycin Nausea And Vomiting  . Penicillins Nausea And Vomiting    Did it involve swelling of the face/tongue/throat, SOB, or low BP? No Did it involve sudden or severe rash/hives, skin peeling, or any reaction on the  inside of your mouth or nose? No Did you need to seek medical attention at a hospital or doctor's office? No When did it last happen?childhood allergy If all above answers are "NO", may proceed with cephalosporin use.   . Tetracycline Nausea And Vomiting   Current Outpatient Medications on File Prior to Visit  Medication Sig Dispense Refill  . aspirin EC 81 MG EC tablet Take 1 tablet (81 mg total) by mouth daily.    .Marland Kitchenatorvastatin (LIPITOR) 40 MG tablet TAKE 1 TABLET DAILY AT 6 P.M. 90 tablet 0  . bictegravir-emtricitabine-tenofovir AF (BIKTARVY) 50-200-25 MG TABS tablet Take 1 tablet by mouth daily. 30 tablet 5  . cetirizine (ZYRTEC) 10 MG tablet Take 10 mg by mouth daily.    . fluticasone (FLONASE) 50 MCG/ACT nasal spray Place 1 spray into both nostrils daily as needed for allergies or rhinitis.    .Marland KitchenFREESTYLE LITE test strip TEST BLOOD SUGAR TWICE DAILY AS DIRECTED 100 each 8  . gabapentin (NEURONTIN) 100 MG capsule 100 mg morning, 100 mg afternoon, 300 mg at bedtime 450 capsule 0  . glipiZIDE (GLUCOTROL) 10 MG tablet TAKE 1 TABLET TWICE A DAY BEFORE MEALS 180 tablet 0  . glucose 4 GM chewable tablet Chew 1 tablet (4 g total) by mouth as needed for low blood sugar. 30 tablet 3  . KLOR-CON M20 20 MEQ tablet TAKE 1 TABLET DAILY 90 tablet 0  . mirtazapine (REMERON) 15 MG tablet Take 1 tablet (15 mg total) by mouth at bedtime. 90 tablet 3  . sitaGLIPtin (JANUVIA) 100 MG tablet Take 1 tablet (100 mg total) by mouth daily. 90 tablet 1  . topiramate (TOPAMAX) 100 MG tablet TAKE 1 TABLET AT BEDTIME AS NEEDED 90 tablet 1   No current facility-administered medications on file prior to visit.    Review of Systems     Objective:   Physical Exam Constitutional:      General: She is not in acute distress.    Appearance: She is well-developed and normal weight. She is not ill-appearing or diaphoretic.  Eyes:     General: No scleral icterus.    Conjunctiva/sclera: Conjunctivae normal.      Pupils: Pupils are equal, round, and reactive to light.  Cardiovascular:     Rate and Rhythm: Normal rate and regular rhythm.     Pulses: Normal pulses.     Heart sounds: Normal heart sounds.  Pulmonary:     Effort: Pulmonary effort is normal. No respiratory distress.     Breath sounds: Normal breath sounds. No wheezing or rales.  Chest:     Chest wall: No tenderness.  Abdominal:     General: Abdomen is flat. Bowel sounds are normal.     Palpations: Abdomen is soft. There is no hepatomegaly, splenomegaly, mass or pulsatile mass.     Tenderness: There is abdominal tenderness in the right lower quadrant and suprapubic area. There is right CVA tenderness. There is no left CVA tenderness, guarding or rebound. Negative signs include Murphy's sign and McBurney's sign.     Hernia: No hernia is present.     Comments: Tender in R lower abd/suprapubic  No rebound  Some CVA tenderness on R  Musculoskeletal:     Cervical back: Normal range of motion and neck supple.  Lymphadenopathy:     Cervical: No cervical adenopathy.  Skin:    General: Skin is warm and dry.     Coloration: Skin is not pale.     Findings: No erythema or rash.  Neurological:     Mental Status: She is alert.  Psychiatric:        Mood and Affect: Mood normal.           Assessment & Plan:   Problem List Items Addressed This Visit      Genitourinary   Complicated UTI (urinary tract infection) - Primary    With moderate R flank pain  Disc poss of kidney stone or pyelonephritis  Px high dose cipro and inc fluid intake If no improvement tomorrow would recommend CT scan  Pt agreed and will update tomorrow        Other Visit Diagnoses    Dysuria       Relevant Orders   POCT Urinalysis Dipstick (Automated) (Completed)   Urine Culture   Urinary frequency       Relevant Orders   POCT Urinalysis Dipstick (Automated) (Completed)   Urine Culture

## 2019-12-10 LAB — URINE CULTURE
MICRO NUMBER:: 10527501
SPECIMEN QUALITY:: ADEQUATE

## 2019-12-28 ENCOUNTER — Other Ambulatory Visit: Payer: Self-pay | Admitting: Internal Medicine

## 2020-01-24 ENCOUNTER — Other Ambulatory Visit: Payer: Self-pay | Admitting: Internal Medicine

## 2020-02-14 ENCOUNTER — Other Ambulatory Visit: Payer: Self-pay | Admitting: Internal Medicine

## 2020-02-14 DIAGNOSIS — E1165 Type 2 diabetes mellitus with hyperglycemia: Secondary | ICD-10-CM

## 2020-04-11 ENCOUNTER — Other Ambulatory Visit: Payer: Self-pay | Admitting: Internal Medicine

## 2020-04-23 ENCOUNTER — Other Ambulatory Visit: Payer: Self-pay | Admitting: Internal Medicine

## 2020-05-22 DIAGNOSIS — Q141 Congenital malformation of retina: Secondary | ICD-10-CM | POA: Diagnosis not present

## 2020-05-22 DIAGNOSIS — H47393 Other disorders of optic disc, bilateral: Secondary | ICD-10-CM | POA: Diagnosis not present

## 2020-05-22 DIAGNOSIS — H2513 Age-related nuclear cataract, bilateral: Secondary | ICD-10-CM | POA: Diagnosis not present

## 2020-05-22 DIAGNOSIS — E119 Type 2 diabetes mellitus without complications: Secondary | ICD-10-CM | POA: Diagnosis not present

## 2020-05-22 DIAGNOSIS — H43393 Other vitreous opacities, bilateral: Secondary | ICD-10-CM | POA: Diagnosis not present

## 2020-05-28 ENCOUNTER — Other Ambulatory Visit: Payer: Self-pay

## 2020-05-28 ENCOUNTER — Encounter: Payer: Self-pay | Admitting: Infectious Diseases

## 2020-05-28 ENCOUNTER — Ambulatory Visit (INDEPENDENT_AMBULATORY_CARE_PROVIDER_SITE_OTHER): Payer: BC Managed Care – PPO | Admitting: Infectious Diseases

## 2020-05-28 VITALS — BP 124/75 | HR 98 | Wt 153.0 lb

## 2020-05-28 DIAGNOSIS — Z23 Encounter for immunization: Secondary | ICD-10-CM

## 2020-05-28 DIAGNOSIS — Z21 Asymptomatic human immunodeficiency virus [HIV] infection status: Secondary | ICD-10-CM

## 2020-05-28 DIAGNOSIS — F339 Major depressive disorder, recurrent, unspecified: Secondary | ICD-10-CM | POA: Diagnosis not present

## 2020-05-28 DIAGNOSIS — Z0000 Encounter for general adult medical examination without abnormal findings: Secondary | ICD-10-CM | POA: Diagnosis not present

## 2020-05-28 MED ORDER — BIKTARVY 50-200-25 MG PO TABS: 1.0000 | ORAL_TABLET | Freq: Every day | ORAL | 11 refills | Status: DC

## 2020-05-28 NOTE — Patient Instructions (Addendum)
So nice to see you!   Please stop by the lab on your way out to update blood work.   Please schedule a visit for the COVID booster vaccine at your convenience.   We gave you your flu shot today   Please come back in 6 months for another visit.

## 2020-05-28 NOTE — Assessment & Plan Note (Signed)
Vaccination counseling discussed at today's visit. Updated as outlined per recommendations for [facility] at today's visit.  Last cervical cancer screening 02/15/2019. Results were: normal Contraception: post-menopausal Last Mammogram: 07/27/2019. Results were: normal Last colon cancer screening: 11/25/2012  Depression screening discussed today - no needs at this time.  Patient is receiving dental care through private dentist. No needs identified today.  STI screening offered today. Condoms declined .

## 2020-05-28 NOTE — Assessment & Plan Note (Signed)
Stable on Mirtazapine but having some increased weight s/e with it. Overall stable and down 5 lbs actually.

## 2020-05-28 NOTE — Assessment & Plan Note (Signed)
Doing well on Biktarvy with undetectable VL and healthy fully reconstituted CD4 4mago. Will update pertinent labs today for ongoing monitoring.  FU in [facility] labs same day.

## 2020-05-28 NOTE — Progress Notes (Signed)
Patient: [name]  DOB: [birth year] MRN: [id number] PCP: Jearld Fenton, NP    Patient Active Problem List   Diagnosis Date Noted  . Neuropathy 11/22/2019  . Healthcare maintenance 04/01/2018  . Migraine 02/23/2018  . Asymptomatic HIV infection, with no history of HIV-related illness ([place])   . Stroke (cerebrum) ([place]) - R basal ganglia d/t small vessel dz 02/18/2018  . HLD (hyperlipidemia) 08/02/2014  . Diabetes mellitus type 2, uncontrolled, with complications ([place]) 82/50/0370  . Ulcerative colitis ([place]) 06/26/2008  . Depression, recurrent ([place]) 04/04/2008     Subjective:  Brief ID:  [name] is a 64 y.o. female with HIV infection diagnosed August 2019 on routine screening after having had a stroke. VL 118,000 copies, CD4 500.  HIV Risk: heterosexual.  OI Hx: none  Previous Regimens:   Biktarvy 02-2018: suppressed   Resistance Testing:   02-2018 wildtype virus     Chief Complaint  Patient presents with  . Follow-up    flu shot; declined condoms; no other complaints;      HPI/ROS:  Doing well today. No concerns to share today aside from wanting to get her booster for COVID vaccine. She started a new job recently and enjoys it very much. Continues on Biktarvy once daily without any missed doses. No side effects or trouble with access to medications.  Having some swings in weight and wants to try a "fat blocker."    Review of Systems  Constitutional: Negative for appetite change, chills, fatigue, fever and unexpected weight change.  HENT: Negative for mouth sores, sore throat and trouble swallowing.   Eyes: Negative for pain and visual disturbance.  Respiratory: Negative for cough and shortness of breath.   Cardiovascular: Negative for chest pain.  Gastrointestinal: Negative for abdominal pain, diarrhea and nausea.  Genitourinary: Negative for dysuria, menstrual problem and pelvic pain.  Musculoskeletal: Negative for back pain and neck pain.   Skin: Negative for color change and rash.  Neurological: Positive for numbness. Negative for dizziness, weakness and headaches.  Hematological: Negative for adenopathy.  Psychiatric/Behavioral: Negative for dysphoric mood. The patient is not nervous/anxious.      Past Medical History:  Diagnosis Date  . Arthritis    fingers  . Asthma   . Diabetes mellitus without complication ([place]) 4-88-89   hx. NIDDM-dx. 3 weeks ago  . Headache    migraines - none since stroke  . HIV (human immunodeficiency virus infection) ([place])   . Hyperlipidemia    hx  . Seasonal allergies   . Stroke ([place]) 02/18/2018   Residual Left side weakness and speech issues  . Ulcerative colitis ([place])   . Wears contact lenses   . Wears partial dentures    upper    Outpatient Medications Prior to Visit  Medication Sig Dispense Refill  . bictegravir-emtricitabine-tenofovir AF (BIKTARVY) 50-200-25 MG TABS tablet Take 1 tablet by mouth daily. 30 tablet 5  . aspirin EC 81 MG EC tablet Take 1 tablet (81 mg total) by mouth daily.    Marland Kitchen atorvastatin (LIPITOR) 40 MG tablet TAKE 1 TABLET DAILY AT 6 P.M. 90 tablet 0  . cetirizine (ZYRTEC) 10 MG tablet Take 10 mg by mouth daily.    . ciprofloxacin (CIPRO) 500 MG tablet Take 1 tablet (500 mg total) by mouth 2 (two) times daily. 14 tablet 0  . fluticasone (FLONASE) 50 MCG/ACT nasal spray Place 1 spray into both nostrils daily as needed for allergies or rhinitis.    Marland Kitchen FREESTYLE LITE  test strip TEST BLOOD SUGAR TWICE DAILY AS DIRECTED 100 each 11  . gabapentin (NEURONTIN) 100 MG capsule TAKE 1 CAPSULE IN THE MORNING, 1 CAPSULE AFTERNOON AND 3 CAPSULES (300 MG) AT BEDTIME 450 capsule 0  . glipiZIDE (GLUCOTROL) 10 MG tablet TAKE 1 TABLET TWICE A DAY BEFORE MEALS 180 tablet 0  . glucose 4 GM chewable tablet Chew 1 tablet (4 g total) by mouth as needed for low blood sugar. 30 tablet 3  . KLOR-CON M20 20 MEQ tablet TAKE 1 TABLET DAILY 90 tablet 0  . mirtazapine (REMERON) 15 MG tablet  TAKE ONE TABLET BY MOUTH AT BEDTIME 90 tablet 10  . sitaGLIPtin (JANUVIA) 100 MG tablet Take 1 tablet (100 mg total) by mouth daily. 90 tablet 1  . topiramate (TOPAMAX) 100 MG tablet TAKE 1 TABLET AT BEDTIME AS NEEDED 90 tablet 0   No facility-administered medications prior to visit.     Allergies  Allergen Reactions  . Azithromycin Itching    Muscle aches  . Erythromycin Nausea And Vomiting  . Penicillins Nausea And Vomiting    Did it involve swelling of the face/tongue/throat, SOB, or low BP? No Did it involve sudden or severe rash/hives, skin peeling, or any reaction on the inside of your mouth or nose? No Did you need to seek medical attention at a hospital or doctor's office? No When did it last happen?childhood allergy If all above answers are "NO", may proceed with cephalosporin use.   . Tetracycline Nausea And Vomiting    Social History   Tobacco Use  . Smoking status: Never Smoker  . Smokeless tobacco: Never Used  Vaping Use  . Vaping Use: Never used  Substance Use Topics  . Alcohol use: Not Currently    Alcohol/week: 0.0 standard drinks  . Drug use: No    Objective:   Vitals:   05/28/20 1146  BP: 124/75  Pulse: 98  Weight: 153 lb (69.4 kg)   Body mass index is 27.98 kg/m.  Physical Exam Constitutional:      Appearance: Normal appearance. She is well-developed.     Comments: Seated comfortably in chair.   HENT:     Mouth/Throat:     Mouth: No oral lesions.     Dentition: Normal dentition. No dental abscesses.     Pharynx: No oropharyngeal exudate.  Cardiovascular:     Rate and Rhythm: Normal rate and regular rhythm.     Heart sounds: Normal heart sounds.  Pulmonary:     Effort: Pulmonary effort is normal.     Breath sounds: Normal breath sounds.  Abdominal:     General: There is no distension.     Palpations: Abdomen is soft.     Tenderness: There is no abdominal tenderness.  Lymphadenopathy:     Cervical: No cervical adenopathy.   Skin:    General: Skin is warm and dry.     Findings: No rash.  Neurological:     Mental Status: She is alert and oriented to person, place, and time.  Psychiatric:        Judgment: Judgment normal.     Comments: In good spirits today and engaged in care discussion     Lab Results: HIV 1 RNA Quant (copies/mL)  Date Value  10/31/2019 <20 NOT DETECTED  04/18/2019 <20 NOT DETECTED  09/27/2018 <20 NOT DETECTED   CD4 T Cell Abs (/uL)  Date Value  10/31/2019 886  04/18/2019 937  09/27/2018 770    Lab Results  Component  Value Date   WBC 4.5 10/31/2019   HGB 11.2 (L) 10/31/2019   HCT 34.2 (L) 10/31/2019   MCV 93.2 10/31/2019   PLT 229 10/31/2019    Lab Results  Component Value Date   CREATININE 1.01 (H) 10/31/2019   BUN 18 10/31/2019   NA 142 10/31/2019   K 4.2 10/31/2019   CL 112 (H) 10/31/2019   CO2 24 10/31/2019    Lab Results  Component Value Date   ALT 22 10/31/2019   AST 21 10/31/2019   ALKPHOS 123 03/01/2019   BILITOT 0.2 10/31/2019     Assessment & Plan:   Problem List Items Addressed This Visit      Unprioritized   Healthcare maintenance    Vaccination counseling discussed at today's visit. Updated as outlined per recommendations for [facility] at today's visit.  Last cervical cancer screening 02/15/2019. Results were: normal Contraception: post-menopausal Last Mammogram: 07/27/2019. Results were: normal Last colon cancer screening: 11/25/2012  Depression screening discussed today - no needs at this time.  Patient is receiving dental care through private dentist. No needs identified today.  STI screening offered today. Condoms declined .       Depression, recurrent ([place])    Stable on Mirtazapine but having some increased weight s/e with it. Overall stable and down 5 lbs actually.       Asymptomatic HIV infection, with no history of HIV-related illness ([place]) - Primary    Doing well on Biktarvy with undetectable VL and healthy fully reconstituted CD4 6m ago. Will update pertinent labs today for ongoing monitoring.  FU in [facility] labs same day.       Relevant Medications   bictegravir-emtricitabine-tenofovir AF (BIKTARVY) 50-200-25 MG TABS tablet   Other Relevant Orders   HIV-1 RNA quant-no reflex-bld   T-helper cell (CD4)- ([facility] clinic only)    Other Visit Diagnoses    Need for immunization against influenza       Relevant Orders   Flu Vaccine QUAD 36+ mos IM (Completed)     Return in about 6 months (around 11/25/2020).   StJanene MadeiraMSN, NP-C ReBapt[facility] Medical Center Southor Infectious DiMansfield Centerager: [phone]: [phone]/15/21  2:16 PM

## 2020-05-29 LAB — T-HELPER CELL (CD4) - (RCID CLINIC ONLY)
CD4 % Helper T Cell: 51 % (ref 33–65)
CD4 T Cell Abs: 876 /uL (ref 400–1790)

## 2020-05-30 LAB — HIV-1 RNA QUANT-NO REFLEX-BLD
HIV 1 RNA Quant: <20 Copies/mL
HIV-1 RNA Quant, Log: <1.3 Log cps/mL

## 2020-06-11 DIAGNOSIS — H2513 Age-related nuclear cataract, bilateral: Secondary | ICD-10-CM | POA: Diagnosis not present

## 2020-06-11 DIAGNOSIS — H18609 Keratoconus, unspecified, unspecified eye: Secondary | ICD-10-CM | POA: Diagnosis not present

## 2020-06-22 ENCOUNTER — Other Ambulatory Visit: Payer: Self-pay | Admitting: Internal Medicine

## 2020-07-16 ENCOUNTER — Other Ambulatory Visit: Payer: Self-pay | Admitting: Internal Medicine

## 2020-07-20 NOTE — Telephone Encounter (Signed)
Duplicate request

## 2020-07-23 ENCOUNTER — Other Ambulatory Visit: Payer: Self-pay | Admitting: Internal Medicine

## 2020-09-20 ENCOUNTER — Other Ambulatory Visit: Payer: Self-pay | Admitting: Internal Medicine

## 2020-09-25 ENCOUNTER — Other Ambulatory Visit: Payer: Self-pay

## 2020-09-25 ENCOUNTER — Encounter: Payer: Self-pay | Admitting: Internal Medicine

## 2020-09-25 ENCOUNTER — Ambulatory Visit (INDEPENDENT_AMBULATORY_CARE_PROVIDER_SITE_OTHER)
Admission: RE | Admit: 2020-09-25 | Discharge: 2020-09-25 | Disposition: A | Discharge status: Home / Self Care | Payer: BC Managed Care – PPO | Source: Ambulatory Visit | Attending: Internal Medicine | Admitting: Internal Medicine

## 2020-09-25 ENCOUNTER — Other Ambulatory Visit: Payer: Self-pay | Admitting: Internal Medicine

## 2020-09-25 ENCOUNTER — Ambulatory Visit (INDEPENDENT_AMBULATORY_CARE_PROVIDER_SITE_OTHER): Payer: BC Managed Care – PPO | Admitting: Internal Medicine

## 2020-09-25 VITALS — BP 126/82 | HR 76 | Temp 97.9°F | Ht 62.0 in | Wt 156.0 lb

## 2020-09-25 DIAGNOSIS — Z0001 Encounter for general adult medical examination with abnormal findings: Secondary | ICD-10-CM | POA: Diagnosis not present

## 2020-09-25 DIAGNOSIS — I63511 Cerebral infarction due to unspecified occlusion or stenosis of right middle cerebral artery: Secondary | ICD-10-CM

## 2020-09-25 DIAGNOSIS — M25561 Pain in right knee: Secondary | ICD-10-CM

## 2020-09-25 DIAGNOSIS — E1165 Type 2 diabetes mellitus with hyperglycemia: Secondary | ICD-10-CM | POA: Diagnosis not present

## 2020-09-25 DIAGNOSIS — E118 Type 2 diabetes mellitus with unspecified complications: Secondary | ICD-10-CM | POA: Diagnosis not present

## 2020-09-25 DIAGNOSIS — Z21 Asymptomatic human immunodeficiency virus [HIV] infection status: Secondary | ICD-10-CM | POA: Diagnosis not present

## 2020-09-25 DIAGNOSIS — E782 Mixed hyperlipidemia: Secondary | ICD-10-CM | POA: Diagnosis not present

## 2020-09-25 DIAGNOSIS — K51919 Ulcerative colitis, unspecified with unspecified complications: Secondary | ICD-10-CM

## 2020-09-25 DIAGNOSIS — G43009 Migraine without aura, not intractable, without status migrainosus: Secondary | ICD-10-CM

## 2020-09-25 DIAGNOSIS — F339 Major depressive disorder, recurrent, unspecified: Secondary | ICD-10-CM

## 2020-09-25 DIAGNOSIS — G629 Polyneuropathy, unspecified: Secondary | ICD-10-CM

## 2020-09-25 DIAGNOSIS — IMO0002 Reserved for concepts with insufficient information to code with codable children: Secondary | ICD-10-CM

## 2020-09-25 DIAGNOSIS — M25511 Pain in right shoulder: Secondary | ICD-10-CM

## 2020-09-25 LAB — COMPREHENSIVE METABOLIC PANEL
ALT: 26 U/L (ref 0–35)
AST: 20 U/L (ref 0–37)
Albumin: 4.1 g/dL (ref 3.5–5.2)
Alkaline Phosphatase: 104 U/L (ref 39–117)
BUN: 14 mg/dL (ref 6–23)
CO2: 26 mEq/L (ref 19–32)
Calcium: 9 mg/dL (ref 8.4–10.5)
Chloride: 106 mEq/L (ref 96–112)
Creatinine, Ser: 0.98 mg/dL (ref 0.40–1.20)
GFR: 61.38 mL/min (ref >60.00)
Glucose, Bld: 148 mg/dL — ABNORMAL HIGH (ref 70–99)
Potassium: 3.5 mEq/L (ref 3.5–5.1)
Sodium: 139 mEq/L (ref 135–145)
Total Bilirubin: 0.5 mg/dL (ref 0.2–1.2)
Total Protein: 7.4 g/dL (ref 6.0–8.3)

## 2020-09-25 LAB — LIPID PANEL
Cholesterol: 154 mg/dL (ref 0–200)
HDL: 47.2 mg/dL (ref >39.00)
LDL Cholesterol: 70 mg/dL (ref 0–99)
NonHDL: 107.17
Total CHOL/HDL Ratio: 3
Triglycerides: 185 mg/dL — ABNORMAL HIGH (ref 0.0–149.0)
VLDL: 37 mg/dL (ref 0.0–40.0)

## 2020-09-25 LAB — MICROALBUMIN / CREATININE URINE RATIO
Creatinine,U: 145.4 mg/dL
Microalb Creat Ratio: 0.5 mg/g (ref 0.0–30.0)
Microalb, Ur: 0.7 mg/dL (ref 0.0–1.9)

## 2020-09-25 LAB — CBC
HCT: 37.9 % (ref 36.0–46.0)
Hemoglobin: 12.4 g/dL (ref 12.0–15.0)
MCHC: 32.7 g/dL (ref 30.0–36.0)
MCV: 93.1 fl (ref 78.0–100.0)
Platelets: 209 10*3/uL (ref 150.0–400.0)
RBC: 4.07 Mil/uL (ref 3.87–5.11)
RDW: 13.3 % (ref 11.5–15.5)
WBC: 4.4 10*3/uL (ref 4.0–10.5)

## 2020-09-25 LAB — HEMOGLOBIN A1C: Hgb A1c MFr Bld: 7.8 % — ABNORMAL HIGH (ref 4.6–6.5)

## 2020-09-25 MED ORDER — BUTALBITAL-APAP-CAFFEINE 50-325-40 MG PO TABS: 1.0000 | ORAL_TABLET | Freq: Four times a day (QID) | ORAL | 0 refills | Status: AC | PRN

## 2020-09-25 NOTE — Assessment & Plan Note (Signed)
Persistent left hemiparesis Blood pressure controlled A1c controlled Continue Aspirin and Atorvastatin

## 2020-09-25 NOTE — Assessment & Plan Note (Signed)
A1c and urine microalbumin today Encouraged her to consume a low carb diet and exercise for weight loss Continue Glipizide and Januvia We will D/C Gabapentin due to nonuse Encourage routine eye exams Encourage routine foot exams Immunizations UTD

## 2020-09-25 NOTE — Assessment & Plan Note (Signed)
C-Met and lipid profile today Encouraged him to consume a low-fat diet Continue Atorvastatin 

## 2020-09-25 NOTE — Assessment & Plan Note (Signed)
Last viral load and CD4 reviewed Continue Biktarvy Lipid profile and A1c today

## 2020-09-25 NOTE — Assessment & Plan Note (Signed)
Continue Topamax for now Rx for Fioricet as needed for breakthrough Will monitor

## 2020-09-25 NOTE — Assessment & Plan Note (Signed)
Deteriorated Increase Mirtazapine to 30 mg  Support offered  Update me in 1 month and let me know how you are doing

## 2020-09-25 NOTE — Assessment & Plan Note (Signed)
No issues off meds We will monitor

## 2020-09-25 NOTE — Progress Notes (Signed)
Subjective:    Patient ID: [name], female    DOB: 1955-12-29, 65 y.o.   MRN: [id number]  HPI  Patient presents the clinic today for her annual exam.  She is also due to follow-up chronic conditions.  Depression: Deteriorated due to marital stress. She is taking Mirtazapine as prescribed.  She is not currently seeing a therapist.  She denies anxiety, SI/HI.  DM2: Her last A1c was 6.9%, 09/2019.  She is taking Glipizide, Januvia as prescribed.  She is not checking her sugars routinely. She checks her feet routinely.  Her last eye exam was.  HIV: Her last viral load was undetectable and CD4 counts were 876, 09/2019.  She is taking Biktarvy as prescribed.  She does follow with ID.  HLD: Her last LDL was 60, 10/2019.  She denies myalgias on Atorvastatin.  She tries to consume a low-fat diet.  Migraines: Deteriorated due to worsening stress, managed on Topamax.  She does not take anything for breakthrough.  She is not currently following with neurology.  Hx of Stroke: With residual left hemiparesis.  She is taking Aspirin and Atorvastatin as prescribed.  She follows with neurology.  Ulcerative Colitis: Currently no issues, off meds.  She is not following with GI.  She c/o right shoulder pain. This started 1 month ago. She describes the pain as sharp and stabbing. The pain is worse with movement, especially lifting her arm above her head. She denies numbness, tingling or weakness of the right arm. She denies any injury to the area. She has not taken anything OTC for this.  She also c/o right knee pain. This started 1 month ago. She reports it is more of a sense that the kneecap is moving. She denies recent fall or injury to the area. She has not tried anything OTC for this.   Flu: 05/2020 Tetanus: 02/2019 Pneumovax: 02/2018 Prevnar: 04/2019 Shingrix: never Covid: Moderna x 3 Pap smear: 02/2019 Mammogram: 07/2019 Bone density: 07/2019 Colon screening: 11/2012 Dentist:  [facility] screening: annually  Diet: She does eat some meat. She consumes some fruits and veggies. She tries to avoid fried foods.  Exercise: None  Review of Systems      Past Medical History:  Diagnosis Date  . Arthritis    fingers  . Asthma   . Diabetes mellitus without complication ([place]) 1-91-47   hx. NIDDM-dx. 3 weeks ago  . Headache    migraines - none since stroke  . HIV (human immunodeficiency virus infection) ([place])   . Hyperlipidemia    hx  . Seasonal allergies   . Stroke ([place]) 02/18/2018   Residual Left side weakness and speech issues  . Ulcerative colitis ([place])   . Wears contact lenses   . Wears partial dentures    upper    Current Outpatient Medications  Medication Sig Dispense Refill  . aspirin EC 81 MG EC tablet Take 1 tablet (81 mg total) by mouth daily.    Marland Kitchen atorvastatin (LIPITOR) 40 MG tablet Take 1 tablet (40 mg total) by mouth daily. MUST SCHEDULE PHYSICAL 90 tablet 0  . bictegravir-emtricitabine-tenofovir AF (BIKTARVY) 50-200-25 MG TABS tablet Take 1 tablet by mouth daily. 30 tablet 11  . cetirizine (ZYRTEC) 10 MG tablet Take 10 mg by mouth daily.    . ciprofloxacin (CIPRO) 500 MG tablet Take 1 tablet (500 mg total) by mouth 2 (two) times daily. 14 tablet 0  . fluticasone (FLONASE) 50 MCG/ACT nasal spray Place 1 spray into both nostrils daily as  needed for allergies or rhinitis.    Marland Kitchen FREESTYLE LITE test strip TEST BLOOD SUGAR TWICE DAILY AS DIRECTED 100 each 11  . gabapentin (NEURONTIN) 100 MG capsule TAKE 1 CAPSULE IN THE MORNING, 1 CAPSULE AFTERNOON AND 3 CAPSULES (300 MG) AT BEDTIME 450 capsule 0  . glipiZIDE (GLUCOTROL) 10 MG tablet Take 1 tablet (10 mg total) by mouth 2 (two) times daily before a meal. MUST SCHEDULE PHYSICAL 180 tablet 0  . glucose 4 GM chewable tablet Chew 1 tablet (4 g total) by mouth as needed for low blood sugar. 30 tablet 3  . JANUVIA 100 MG tablet TAKE 1 TABLET DAILY 90 tablet 0  . mirtazapine (REMERON) 15 MG tablet  TAKE ONE TABLET BY MOUTH AT BEDTIME 90 tablet 10  . potassium chloride SA (KLOR-CON M20) 20 MEQ tablet Take 1 tablet (20 mEq total) by mouth daily. MUST SCHEDULE PHYSICAL 90 tablet 0  . topiramate (TOPAMAX) 100 MG tablet TAKE 1 TABLET AT BEDTIME AS NEEDED 90 tablet 0   No current facility-administered medications for this visit.    Allergies  Allergen Reactions  . Azithromycin Itching    Muscle aches  . Erythromycin Nausea And Vomiting  . Penicillins Nausea And Vomiting    Did it involve swelling of the face/tongue/throat, SOB, or low BP? No Did it involve sudden or severe rash/hives, skin peeling, or any reaction on the inside of your mouth or nose? No Did you need to seek medical attention at a hospital or doctor's office? No When did it last happen?childhood allergy If all above answers are "NO", may proceed with cephalosporin use.   . Tetracycline Nausea And Vomiting    Family History  Problem Relation Age of Onset  . Hypertension Mother   . Diabetes Mother   . Cancer Father        lung ca  . Seizures Daughter        38yr    Social History   Socioeconomic History  . Marital status: Legally Separated    Spouse name: Not on file  . Number of children: Not on file  . Years of education: Not on file  . Highest education level: Not on file  Occupational History    Comment: disabled  Tobacco Use  . Smoking status: Never Smoker  . Smokeless tobacco: Never Used  Vaping Use  . Vaping Use: Never used  Substance and Sexual Activity  . Alcohol use: Not Currently    Alcohol/week: 0.0 standard drinks  . Drug use: No  . Sexual activity: Not Currently    Partners: Male    Comment: offered condoms  Other Topics Concern  . Not on file  Social History Narrative  . Not on file   Social Determinants of Health   Financial Resource Strain: Not on file  Food Insecurity: Not on file  Transportation Needs: Not on file  Physical Activity: Not on file  Stress: Not on  file  Social Connections: Not on file  Intimate Partner Violence: Not on file     Constitutional: Pt reports intermittent headaches. Denies fever, malaise, fatigue, or abrupt weight changes.  HEENT: Denies eye pain, eye redness, ear pain, ringing in the ears, wax buildup, runny nose, nasal congestion, bloody nose, or sore throat. Respiratory: Denies difficulty breathing, shortness of breath, cough or sputum production.   Cardiovascular: Denies chest pain, chest tightness, palpitations or swelling in the hands or feet.  Gastrointestinal: Denies abdominal pain, bloating, constipation, diarrhea or blood in the stool.  GU: Denies urgency, frequency, pain with urination, burning sensation, blood in urine, odor or discharge. Musculoskeletal: Pt reports left sided weakness. Denies decrease in range of motion, difficulty with gait, muscle pain or joint pain and swelling.  Skin: Denies redness, rashes, lesions or ulcercations.  Neurological: Denies dizziness, difficulty with memory, difficulty with speech or problems with balance and coordination.  Psych: Pt has a history of depression. Denies anxiety, SI/HI.  No other specific complaints in a complete review of systems (except as listed in HPI above).  Objective:   Physical Exam BP 126/82   Pulse 76   Temp 97.9 F (36.6 C) (Temporal)   Ht 5' 2"  (1.575 m)   Wt 156 lb (70.8 kg)   SpO2 98%   BMI 28.53 kg/m   Wt Readings from Last 3 Encounters:  05/28/20 153 lb (69.4 kg)  12/08/19 151 lb (68.5 kg)  11/24/19 158 lb (71.7 kg)    General: Appears her stated age, well developed, well nourished in NAD. Skin: Warm, dry and intact. No rashes or ulcerations noted. HEENT: Head: normal shape and size; Eyes: sclera white, no icterus, conjunctiva pink, PERRLA and EOMs intact; Neck:  Neck supple, trachea midline. No masses, lumps or thyromegaly present.  Cardiovascular: Normal rate and rhythm. S1,S2 noted.  No murmur, rubs or gallops noted. No JVD or  BLE edema. No carotid bruits noted. Pulmonary/Chest: Normal effort and positive vesicular breath sounds. No respiratory distress. No wheezes, rales or ronchi noted.  Abdomen: Soft and nontender. Normal bowel sounds. No distention or masses noted. Liver, spleen and kidneys non palpable. Musculoskeletal: Normal flexion and extension of the right knee. Pain with palpation over the medial joint line. Decreased external rotation of the right shoulder. Normal internal rotation of the right shoulder. Mildly positive drop can test on the right. Strength 5/5 RUE/RLE, 4/5 LUE/LLE. No difficulty with gait.  Neurological: Alert and oriented. Cranial nerves II-XII grossly intact. Coordination normal.  Psychiatric: Mood and affect mildly flat. Behavior is normal. Judgment and thought content normal.     BMET    Component Value Date/Time   NA 142 10/31/2019 1436   NA 138 06/23/2014 0503   K 4.2 10/31/2019 1436   K 4.2 06/23/2014 0503   CL 112 (H) 10/31/2019 1436   CL 104 06/23/2014 0503   CO2 24 10/31/2019 1436   CO2 27 06/23/2014 0503   GLUCOSE 180 (H) 10/31/2019 1436   GLUCOSE 246 (H) 06/23/2014 0503   BUN 18 10/31/2019 1436   BUN 17 06/23/2014 0503   CREATININE 1.01 (H) 10/31/2019 1436   CALCIUM 9.0 10/31/2019 1436   CALCIUM 8.7 06/23/2014 0503   GFRNONAA >60 03/01/2019 1730   GFRNONAA 65 09/27/2018 1053   GFRAA >60 03/01/2019 1730   GFRAA 76 09/27/2018 1053    Lipid Panel     Component Value Date/Time   CHOL 122 10/31/2019 1436   CHOL 211 (H) 06/22/2014 1432   TRIG 110 10/31/2019 1436   TRIG 473 (H) 06/22/2014 1432   HDL 42 (L) 10/31/2019 1436   HDL 42 06/22/2014 1432   CHOLHDL 2.9 10/31/2019 1436   VLDL 20.2 02/14/2019 1043   VLDL SEE COMMENT 06/22/2014 1432   LDLCALC 60 10/31/2019 1436   LDLCALC SEE COMMENT 06/22/2014 1432    CBC    Component Value Date/Time   WBC 4.5 10/31/2019 1436   RBC 3.67 (L) 10/31/2019 1436   HGB 11.2 (L) 10/31/2019 1436   HGB 12.2 06/23/2014 0503    HCT 34.2 (  L) 10/31/2019 1436   HCT 37.2 06/23/2014 0503   PLT 229 10/31/2019 1436   PLT 251 06/23/2014 0503   MCV 93.2 10/31/2019 1436   MCV 89 06/23/2014 0503   MCH 30.5 10/31/2019 1436   MCHC 32.7 10/31/2019 1436   RDW 12.8 10/31/2019 1436   RDW 13.6 06/23/2014 0503   LYMPHSABS 1,733 10/31/2019 1436   LYMPHSABS 1.8 06/23/2014 0503   MONOABS 0.6 03/01/2019 1730   MONOABS 0.4 06/23/2014 0503   EOSABS 108 10/31/2019 1436   EOSABS 0.0 06/23/2014 0503   BASOSABS 9 10/31/2019 1436   BASOSABS 0.0 06/23/2014 0503    Hgb A1C Lab Results  Component Value Date   HGBA1C 6.9 (H) 10/10/2019            Assessment & Plan:   Preventative Health Maintenance:  Flu shot UTD Tetanus UTD Pneumovax UTD Prevnar UTD Encouraged her to get a Shingrix vaccine Covid vaccine UTD Pap smear UTD Mammogram due- she will call to schedule Bone density UTD Colon screening UTD Encouraged her to consume a balanced diet and exercise regimen Advised her to see an eye doctor and dentist annually Will check CBC, CMET, Lipid, A1C and urine microalbumin today  Acute Right Shoulder Pain:  Xray right shoulder today  Acute Right Knee Pain:  Xray right knee today  RTC in 6 months, follow up chronic conditions   Webb Silversmith, NP This visit occurred during the SARS-CoV-2 public health emergency.  Safety protocols were in place, including screening questions prior to the visit, additional usage of staff PPE, and extensive cleaning of exam room while observing appropriate contact time as indicated for disinfecting solutions.

## 2020-09-25 NOTE — Patient Instructions (Signed)

## 2020-10-03 ENCOUNTER — Ambulatory Visit: Payer: BC Managed Care – PPO | Admitting: Family Medicine

## 2020-10-03 DIAGNOSIS — Z0289 Encounter for other administrative examinations: Secondary | ICD-10-CM

## 2020-10-04 ENCOUNTER — Telehealth: Payer: Self-pay | Admitting: Internal Medicine

## 2020-10-04 MED ORDER — SITAGLIPTIN PHOSPHATE 100 MG PO TABS: 100.0000 mg | ORAL_TABLET | Freq: Every day | ORAL | 2 refills | Status: DC

## 2020-10-04 NOTE — Telephone Encounter (Signed)
Patient called you back about her lab results. Please call the patient back. EM

## 2020-10-04 NOTE — Addendum Note (Signed)
Addended by: Lurlean Nanny on: 10/04/2020 04:41 PM   Modules accepted: Orders

## 2020-10-05 ENCOUNTER — Encounter: Payer: Self-pay | Admitting: Internal Medicine

## 2020-10-05 MED ORDER — GLIPIZIDE 10 MG PO TABS: 10.0000 mg | ORAL_TABLET | Freq: Two times a day (BID) | ORAL | 2 refills | Status: DC

## 2020-10-05 MED ORDER — CANAGLIFLOZIN 100 MG PO TABS: 100.0000 mg | ORAL_TABLET | Freq: Every day | ORAL | 2 refills | Status: DC

## 2020-10-15 ENCOUNTER — Other Ambulatory Visit: Payer: Self-pay | Admitting: Internal Medicine

## 2020-10-17 ENCOUNTER — Telehealth: Payer: Self-pay

## 2020-10-17 NOTE — Telephone Encounter (Signed)
PA has been submitted, waiting on response

## 2020-10-22 ENCOUNTER — Telehealth: Payer: Self-pay | Admitting: Internal Medicine

## 2020-10-22 DIAGNOSIS — IMO0002 Reserved for concepts with insufficient information to code with codable children: Secondary | ICD-10-CM

## 2020-10-22 DIAGNOSIS — E1165 Type 2 diabetes mellitus with hyperglycemia: Secondary | ICD-10-CM

## 2020-10-29 ENCOUNTER — Other Ambulatory Visit: Payer: Self-pay | Admitting: Internal Medicine

## 2020-10-29 NOTE — Telephone Encounter (Signed)
Per last OV note in March, RTC in 6 months, follow up chronic conditions. I filled the med request for 6 months. Please contact pt to get set up or find out if she is transferring to [facility]. Thanks!

## 2020-11-01 NOTE — Telephone Encounter (Signed)
PA for Invokana was denied please advise

## 2020-11-03 NOTE — Telephone Encounter (Signed)
At this point will need referral to Endo. Does she prefer [place] or [place]?

## 2020-11-06 NOTE — Telephone Encounter (Addendum)
Error, please disregard.

## 2020-11-06 NOTE — Telephone Encounter (Signed)
Pt still had some questions and requested a call back from [place].

## 2020-11-06 NOTE — Telephone Encounter (Signed)
I left a detail message on the patient personal vm.

## 2020-11-08 NOTE — Telephone Encounter (Signed)
Referral to endocrinology placed

## 2020-11-08 NOTE — Addendum Note (Signed)
Addended by: Jearld Fenton on: 11/08/2020 11:06 AM   Modules accepted: Orders

## 2020-11-08 NOTE — Telephone Encounter (Signed)
Pt calling again and states that she is needing to speak with [facility]. Attempted to reach out to office, and was advised by Apolonio Schneiders that [name] is seeing a pt and will follow up with pt. Please advise.

## 2020-11-15 ENCOUNTER — Other Ambulatory Visit: Payer: Self-pay

## 2020-11-15 ENCOUNTER — Encounter: Payer: Self-pay | Admitting: Internal Medicine

## 2020-11-15 ENCOUNTER — Ambulatory Visit (INDEPENDENT_AMBULATORY_CARE_PROVIDER_SITE_OTHER): Payer: BC Managed Care – PPO | Admitting: Internal Medicine

## 2020-11-15 VITALS — BP 144/83 | HR 88 | Temp 97.8°F | Resp 18 | Ht 62.0 in | Wt 154.0 lb

## 2020-11-15 DIAGNOSIS — J301 Allergic rhinitis due to pollen: Secondary | ICD-10-CM | POA: Diagnosis not present

## 2020-11-15 DIAGNOSIS — R197 Diarrhea, unspecified: Secondary | ICD-10-CM | POA: Diagnosis not present

## 2020-11-15 DIAGNOSIS — K51911 Ulcerative colitis, unspecified with rectal bleeding: Secondary | ICD-10-CM | POA: Diagnosis not present

## 2020-11-15 DIAGNOSIS — K921 Melena: Secondary | ICD-10-CM | POA: Diagnosis not present

## 2020-11-15 MED ORDER — METHYLPREDNISOLONE ACETATE 80 MG/ML IJ SUSP
80.0000 mg | Freq: Once | INTRAMUSCULAR | Status: AC
  Administered 2020-11-15: 80 mg via INTRAMUSCULAR

## 2020-11-15 MED ORDER — BENZONATATE 200 MG PO CAPS: 200.0000 mg | ORAL_CAPSULE | Freq: Three times a day (TID) | ORAL | 0 refills | Status: DC | PRN

## 2020-11-15 NOTE — Progress Notes (Signed)
Subjective:    Patient ID: [name], female    DOB: Mar 03, 1956, 65 y.o.   MRN: [id number]  HPI  Patient presents to the clinic today with complaint of watery eyes, runny nose, sneezing and cough.  She reports this started 4 days ago.  She is blowing clear mucus out of her nose.  The cough is nonproductive.  She denies headache, visual changes, dizziness, facial pain and pressure, nasal congestion, ear pain, sore throat or shortness of breath.  She denies fever, chills or body aches.  She is taking Zyrtec and Flonase OTC with minimal relief of symptoms.  She has not had sick contacts or exposure to COVID that she is aware of.  She has had 3 COVID vaccines.  She also reports diarrhea and blood when wiping.  This started 2 days ago.  She denies nausea, reflux, vomiting, constipation or bloody diarrhea.  She denies fever, chills.  She denies recent changes in diet or medications.  She reports she has a history of ulcerative colitis but has not had issues with this since she was 65 years old.  She denies history of hemorrhoids, rectal pain or itching.  She has not taken anything OTC for her symptoms.  Review of Systems      Past Medical History:  Diagnosis Date  . Arthritis    fingers  . Asthma   . Diabetes mellitus without complication ([place]) 2-99-37   hx. NIDDM-dx. 3 weeks ago  . Headache    migraines - none since stroke  . HIV (human immunodeficiency virus infection) ([place])   . Hyperlipidemia    hx  . Seasonal allergies   . Stroke ([place]) 02/18/2018   Residual Left side weakness and speech issues  . Ulcerative colitis ([place])   . Wears contact lenses   . Wears partial dentures    upper    Current Outpatient Medications  Medication Sig Dispense Refill  . aspirin EC 81 MG EC tablet Take 1 tablet (81 mg total) by mouth daily.    Marland Kitchen atorvastatin (LIPITOR) 40 MG tablet TAKE 1 TABLET DAILY. MUST SCHEDULE PHYSICAL. 90 tablet 3  . bictegravir-emtricitabine-tenofovir AF (BIKTARVY)  50-200-25 MG TABS tablet Take 1 tablet by mouth daily. 30 tablet 11  . butalbital-acetaminophen-caffeine (FIORICET) 50-325-40 MG tablet Take 1-2 tablets by mouth every 6 (six) hours as needed for headache. 20 tablet 0  . canagliflozin (INVOKANA) 100 MG TABS tablet Take 1 tablet (100 mg total) by mouth daily before breakfast. 30 tablet 2  . cetirizine (ZYRTEC) 10 MG tablet Take 10 mg by mouth daily.    . fluticasone (FLONASE) 50 MCG/ACT nasal spray Place 1 spray into both nostrils daily as needed for allergies or rhinitis.    Marland Kitchen FREESTYLE LITE test strip TEST BLOOD SUGAR TWICE DAILY AS DIRECTED 100 each 11  . gabapentin (NEURONTIN) 100 MG capsule TAKE 1 CAPSULE IN THE MORNING, 1 CAPSULE AFTERNOON AND 3 CAPSULES (300 MG) AT BEDTIME 450 capsule 0  . glipiZIDE (GLUCOTROL) 10 MG tablet TAKE 1 TABLET TWO TIMES DAILY BEFORE MEALS. MUST SCHEDULE PHYSICAL 180 tablet 3  . glucose 4 GM chewable tablet Chew 1 tablet (4 g total) by mouth as needed for low blood sugar. 30 tablet 3  . KLOR-CON M20 20 MEQ tablet TAKE 1 TABLET DAILY. MUST SCHEDULE PHYSICAL. 90 tablet 3  . mirtazapine (REMERON) 15 MG tablet Take 2 tablets (30 mg total) by mouth at bedtime. 180 tablet 3  . sitaGLIPtin (JANUVIA) 100 MG tablet Take 1  tablet (100 mg total) by mouth daily. 30 tablet 2  . topiramate (TOPAMAX) 100 MG tablet TAKE 1 TABLET AT BEDTIME AS NEEDED 90 tablet 3   No current facility-administered medications for this visit.    Allergies  Allergen Reactions  . Azithromycin Itching    Muscle aches  . Erythromycin Nausea And Vomiting  . Penicillins Nausea And Vomiting    Did it involve swelling of the face/tongue/throat, SOB, or low BP? No Did it involve sudden or severe rash/hives, skin peeling, or any reaction on the inside of your mouth or nose? No Did you need to seek medical attention at a hospital or doctor's office? No When did it last happen?childhood allergy If all above answers are "NO", may proceed with  cephalosporin use.   . Tetracycline Nausea And Vomiting    Family History  Problem Relation Age of Onset  . Hypertension Mother   . Diabetes Mother   . Cancer Father        lung ca  . Seizures Daughter        55yr    Social History   Socioeconomic History  . Marital status: Legally Separated    Spouse name: Not on file  . Number of children: Not on file  . Years of education: Not on file  . Highest education level: Not on file  Occupational History    Comment: disabled  Tobacco Use  . Smoking status: Never Smoker  . Smokeless tobacco: Never Used  Vaping Use  . Vaping Use: Never used  Substance and Sexual Activity  . Alcohol use: Not Currently    Alcohol/week: 0.0 standard drinks  . Drug use: No  . Sexual activity: Not Currently    Partners: Male    Comment: offered condoms  Other Topics Concern  . Not on file  Social History Narrative  . Not on file   Social Determinants of Health   Financial Resource Strain: Not on file  Food Insecurity: Not on file  Transportation Needs: Not on file  Physical Activity: Not on file  Stress: Not on file  Social Connections: Not on file  Intimate Partner Violence: Not on file     Constitutional: Denies fever, malaise, fatigue, headache or abrupt weight changes.  HEENT: Positive for watery eyes, runny nose, sneezing.  Denies eye pain, eye redness, ear pain, ringing in the ears, wax buildup nasal congestion, bloody nose, or sore throat. Respiratory: Positive for cough.  Denies difficulty breathing, shortness of breath, or sputum production.  Cardiovascular: Denies chest pain, chest tightness, palpitations or swelling in the hands or feet.  Gastrointestinal:Positive for diarrhea, blood when wiping.  Denies abdominal pain, bloating, constipation.  Neurological: Denies dizziness, difficulty with memory, difficulty with speech or problems with balance and coordination.    No other specific complaints in a complete review of  systems (except as listed in HPI above).  Objective:   Physical Exam   BP (!) 144/83 (BP Location: Right Arm, Patient Position: Sitting, Cuff Size: Normal)   Pulse 88   Temp 97.8 F (36.6 C) (Temporal)   Resp 18   Ht 5' 2"  (1.575 m)   Wt 154 lb (69.9 kg)   SpO2 98%   BMI 28.17 kg/m   Wt Readings from Last 3 Encounters:  09/25/20 156 lb (70.8 kg)  05/28/20 153 lb (69.4 kg)  12/08/19 151 lb (68.5 kg)    General: Appears her stated age, well developed, well nourished in NAD. Skin: Warm, dry and intact. No  rashesnoted. HEENT: Head: normal shape and size, no sinus tenderness noted.; Eyes: sclera white and EOMs intact;  Neck: No adenopathy noted. Cardiovascular: Normal rate and rhythm. S1,S2 noted.  No murmur, rubs or gallops noted.  Pulmonary/Chest: Normal effort and positive vesicular breath sounds. No respiratory distress. No wheezes, rales or ronchi noted.  Abdomen: Soft and nontender. Normal bowel sounds. No distention or masses noted.  Rectal: No external mass or hemorrhoid noted.  Normal rectal tone.  No internal mass noted.  No fissure noted.  Hemoccult negative. Neurological: Alert and oriented.   BMET    Component Value Date/Time   NA 139 09/25/2020 1241   NA 138 06/23/2014 0503   K 3.5 09/25/2020 1241   K 4.2 06/23/2014 0503   CL 106 09/25/2020 1241   CL 104 06/23/2014 0503   CO2 26 09/25/2020 1241   CO2 27 06/23/2014 0503   GLUCOSE 148 (H) 09/25/2020 1241   GLUCOSE 246 (H) 06/23/2014 0503   BUN 14 09/25/2020 1241   BUN 17 06/23/2014 0503   CREATININE 0.98 09/25/2020 1241   CREATININE 1.01 (H) 10/31/2019 1436   CALCIUM 9.0 09/25/2020 1241   CALCIUM 8.7 06/23/2014 0503   GFRNONAA >60 03/01/2019 1730   GFRNONAA 65 09/27/2018 1053   GFRAA >60 03/01/2019 1730   GFRAA 76 09/27/2018 1053    Lipid Panel     Component Value Date/Time   CHOL 154 09/25/2020 1241   CHOL 211 (H) 06/22/2014 1432   TRIG 185.0 (H) 09/25/2020 1241   TRIG 473 (H) 06/22/2014 1432    HDL 47.20 09/25/2020 1241   HDL 42 06/22/2014 1432   CHOLHDL 3 09/25/2020 1241   VLDL 37.0 09/25/2020 1241   VLDL SEE COMMENT 06/22/2014 1432   LDLCALC 70 09/25/2020 1241   LDLCALC 60 10/31/2019 1436   LDLCALC SEE COMMENT 06/22/2014 1432    CBC    Component Value Date/Time   WBC 4.4 09/25/2020 1241   RBC 4.07 09/25/2020 1241   HGB 12.4 09/25/2020 1241   HGB 12.2 06/23/2014 0503   HCT 37.9 09/25/2020 1241   HCT 37.2 06/23/2014 0503   PLT 209.0 09/25/2020 1241   PLT 251 06/23/2014 0503   MCV 93.1 09/25/2020 1241   MCV 89 06/23/2014 0503   MCH 30.5 10/31/2019 1436   MCHC 32.7 09/25/2020 1241   RDW 13.3 09/25/2020 1241   RDW 13.6 06/23/2014 0503   LYMPHSABS 1,733 10/31/2019 1436   LYMPHSABS 1.8 06/23/2014 0503   MONOABS 0.6 03/01/2019 1730   MONOABS 0.4 06/23/2014 0503   EOSABS 108 10/31/2019 1436   EOSABS 0.0 06/23/2014 0503   BASOSABS 9 10/31/2019 1436   BASOSABS 0.0 06/23/2014 0503    Hgb A1C Lab Results  Component Value Date   HGBA1C 7.8 (H) 09/25/2020           Assessment & Plan:   Allergic Rhinitis:  Continue Zyrtec and Flonase OTC 80 mg Depo-Medrol IM today for symptom management Rx for Tessalon Perles 200 mg 3 times daily as needed  Diarrhea, Blood in Stool, Hx of Ulcerative Colitis:  She has not had a bloody BM today Hemoccult negative Monitor symptoms over the weekend, if persist consider further stool studies versus referral to GI for further evaluation of symptoms  Return cautions discussed  Webb Silversmith, NP This visit occurred during the SARS-CoV-2 public health emergency.  Safety protocols were in place, including screening questions prior to the visit, additional usage of staff PPE, and extensive cleaning of exam room while  observing appropriate contact time as indicated for disinfecting solutions.

## 2020-11-15 NOTE — Patient Instructions (Signed)

## 2020-11-22 ENCOUNTER — Other Ambulatory Visit: Payer: Self-pay

## 2020-11-22 DIAGNOSIS — Z113 Encounter for screening for infections with a predominantly sexual mode of transmission: Secondary | ICD-10-CM

## 2020-11-22 DIAGNOSIS — Z21 Asymptomatic human immunodeficiency virus [HIV] infection status: Secondary | ICD-10-CM

## 2020-11-26 ENCOUNTER — Other Ambulatory Visit (HOSPITAL_COMMUNITY)
Admission: RE | Admit: 2020-11-26 | Discharge: 2020-11-26 | Disposition: A | Discharge status: Home / Self Care | Payer: BC Managed Care – PPO | Source: Ambulatory Visit | Attending: Infectious Diseases | Admitting: Infectious Diseases

## 2020-11-26 ENCOUNTER — Other Ambulatory Visit: Payer: BC Managed Care – PPO

## 2020-11-26 ENCOUNTER — Other Ambulatory Visit: Payer: Self-pay

## 2020-11-26 DIAGNOSIS — Z21 Asymptomatic human immunodeficiency virus [HIV] infection status: Secondary | ICD-10-CM | POA: Insufficient documentation

## 2020-11-26 DIAGNOSIS — Z113 Encounter for screening for infections with a predominantly sexual mode of transmission: Secondary | ICD-10-CM

## 2020-11-27 LAB — URINE CYTOLOGY ANCILLARY ONLY
Chlamydia: NEGATIVE
Comment: NEGATIVE
Comment: NORMAL
Neisseria Gonorrhea: NEGATIVE

## 2020-11-27 LAB — T-HELPER CELL (CD4) - (RCID CLINIC ONLY)
CD4 % Helper T Cell: 50 % (ref 33–65)
CD4 T Cell Abs: 952 /uL (ref 400–1790)

## 2020-11-29 LAB — CBC WITH DIFFERENTIAL/PLATELET
Absolute Monocytes: 416 cells/uL (ref 200–950)
Basophils Absolute: 29 cells/uL (ref 0–200)
Basophils Relative: 0.5 %
Eosinophils Absolute: 137 cells/uL (ref 15–500)
Eosinophils Relative: 2.4 %
HCT: 38.1 % (ref 35.0–45.0)
Hemoglobin: 12.4 g/dL (ref 11.7–15.5)
Lymphs Abs: 1984 cells/uL (ref 850–3900)
MCH: 30.6 pg (ref 27.0–33.0)
MCHC: 32.5 g/dL (ref 32.0–36.0)
MCV: 94.1 fL (ref 80.0–100.0)
MPV: 9.8 fL (ref 7.5–12.5)
Monocytes Relative: 7.3 %
Neutro Abs: 3135 cells/uL (ref 1500–7800)
Neutrophils Relative %: 55 %
Platelets: 250 10*3/uL (ref 140–400)
RBC: 4.05 10*6/uL (ref 3.80–5.10)
RDW: 13.1 % (ref 11.0–15.0)
Total Lymphocyte: 34.8 %
WBC: 5.7 10*3/uL (ref 3.8–10.8)

## 2020-11-29 LAB — COMPLETE METABOLIC PANEL WITH GFR
AG Ratio: 1.6 (calc) (ref 1.0–2.5)
ALT: 22 U/L (ref 6–29)
AST: 12 U/L (ref 10–35)
Albumin: 4.1 g/dL (ref 3.6–5.1)
Alkaline phosphatase (APISO): 117 U/L (ref 37–153)
BUN/Creatinine Ratio: 16 (calc) (ref 6–22)
BUN: 17 mg/dL (ref 7–25)
CO2: 28 mmol/L (ref 20–32)
Calcium: 10 mg/dL (ref 8.6–10.4)
Chloride: 106 mmol/L (ref 98–110)
Creat: 1.04 mg/dL — ABNORMAL HIGH (ref 0.50–0.99)
GFR, Est African American: 66 mL/min/{1.73_m2} (ref >=60)
GFR, Est Non African American: 57 mL/min/{1.73_m2} — ABNORMAL LOW (ref >=60)
Globulin: 2.6 g/dL (calc) (ref 1.9–3.7)
Glucose, Bld: 133 mg/dL — ABNORMAL HIGH (ref 65–99)
Potassium: 4.5 mmol/L (ref 3.5–5.3)
Sodium: 139 mmol/L (ref 135–146)
Total Bilirubin: 0.3 mg/dL (ref 0.2–1.2)
Total Protein: 6.7 g/dL (ref 6.1–8.1)

## 2020-11-29 LAB — HIV-1 RNA QUANT-NO REFLEX-BLD
HIV 1 RNA Quant: NOT DETECTED Copies/mL
HIV-1 RNA Quant, Log: NOT DETECTED Log cps/mL

## 2020-11-29 LAB — RPR: RPR Ser Ql: NONREACTIVE

## 2020-12-07 NOTE — Telephone Encounter (Signed)
Pt transferred to [facility] with Webb Silversmith

## 2020-12-11 ENCOUNTER — Other Ambulatory Visit: Payer: Self-pay

## 2020-12-11 ENCOUNTER — Ambulatory Visit (INDEPENDENT_AMBULATORY_CARE_PROVIDER_SITE_OTHER): Payer: BC Managed Care – PPO | Admitting: Infectious Diseases

## 2020-12-11 VITALS — BP 130/70 | HR 83 | Temp 98.3°F | Wt 155.6 lb

## 2020-12-11 DIAGNOSIS — Z21 Asymptomatic human immunodeficiency virus [HIV] infection status: Secondary | ICD-10-CM

## 2020-12-11 NOTE — Patient Instructions (Addendum)
Always nice to see you.   Your blood work looks perfect. No changes from me - continue your Biktarvy once a day.   I would ask your PCP if there is any cheaper alternative than the Januvia - sometimes there is a cheaper drug in that class of medications that your insurance may approve. It may be worth it to contact your insurance to see what may be cheaper for you.   Please call [facility] @ [phone] to schedule a dental appointment   Please return in 6 months for routine follow up care.   Would recommend the second Moderna booster for you - anytime is fine.

## 2020-12-11 NOTE — Assessment & Plan Note (Addendum)
Doing well on once daily Biktarvy with undetectable viral load and fully recovered immune system recently at 952. She is taking her medication as instructed and no concern for side effect or intolerance. No drug integrations noted.  Will provide refills and continue to follow q39m Only recommended change may be to TAF sparing regimen if there is any trouble in the future with kidneys given diabetic.    Vaccination counseling discussed at today's visit. Updated as outlined per recommendations for [facility] at today's visit.  Last cervical cancer screening:  [facility] PAP         PAP SMEAR-Modifier (Every 3 Years) Next due on 02/14/2022   02/15/2019  CYTOLOGY - PAP component of Cytology - PAP([place])   Only the first 1 history entries have been loaded, but more history exists.         Results were: normal Contraception: abstinence. No family planning or pre-conception counseling needed at this time.  Last Mammogram: [facility] Mammogram         MAMMOGRAM (Every 2 Years) Next due on 07/26/2021   07/27/2019  MM 3D SCREEN BREAST BILATERAL   Only the first 1 history entries have been loaded, but more history exists.         Results were: normal Last colon cancer screening: [facility] Colonoscopy         COLONOSCOPY (Pts 45-468yrInsurance coverage will need to be confirmed) (Every 10 Years) Next due on 11/26/2022   11/25/2012  [facility] COLONOSCOPY         Depression screening discussed today - no needs at this time.  Patient is requesting dental care - gave her [facility] clinic's number but she may have access otherwise through private insurance.   Return in about 6 months (around 06/12/2021).

## 2020-12-11 NOTE — Progress Notes (Signed)
Patient: [name]  DOB: Nov 14, 1955 MRN: [id number] PCP: Jearld Fenton, NP     Subjective:  Brief ID:  [name] is a 65 y.o. female with HIV infection diagnosed August 2019 on routine screening after having had a stroke. VL 118,000 copies, CD4 500.  HIV Risk: heterosexual.  OI Hx: none  Previous Regimens:   Biktarvy 02-2018: suppressed   Resistance Testing:   02-2018 wildtype virus     Chief Complaint  Patient presents with  . Follow-up    6 MONTH FOLLOW UP       HPI/ROS:  [name] has been doing very well. No concerns over her Biktarvy, no side effects or concern for intolerance.  New medications reviewed -  Working with a primary care team for diabetes management.  [name] does have some cost concerns with some of her diabetes medication, and particularly Jardiance.  Asking for some advice regarding other options for assistance or the medication class suggestions.  Her daughter is moving and [name] has been helping her through that process.   [name] needs some dental care - needs a new partial put in.  [name] is try to make contact with [facility] dent[facility] in the past, last was about 6 months ago.  [name] does have alternative access to dental insurance.  [name] is otherwise eating and sleeping well.  No concerns with depressed or anxious mood.  Pap smear was in 2020.   Review of Systems  Constitutional: Negative for appetite change, chills, fatigue, fever and unexpected weight change.  HENT: Negative for mouth sores, sore throat and trouble swallowing.        Missing partial   Eyes: Negative for pain and visual disturbance.  Respiratory: Negative for cough and shortness of breath.   Cardiovascular: Negative for chest pain and leg swelling.  Gastrointestinal: Negative for abdominal pain, diarrhea, nausea and vomiting.  Genitourinary: Negative for dysuria, flank pain, menstrual problem and pelvic pain.  Musculoskeletal: Negative for back pain, myalgias and neck pain.   Skin: Negative for color change and rash.  Neurological: Negative for dizziness, weakness, numbness and headaches.  Hematological: Negative for adenopathy.  Psychiatric/Behavioral: Negative for dysphoric mood. The patient is not nervous/anxious.      Past Medical History:  Diagnosis Date  . Arthritis    fingers  . Asthma   . Diabetes mellitus without complication ([place]) 9-56-38   hx. NIDDM-dx. 3 weeks ago  . Headache    migraines - none since stroke  . HIV (human immunodeficiency virus infection) ([place])   . Hyperlipidemia    hx  . Seasonal allergies   . Stroke ([place]) 02/18/2018   Residual Left side weakness and speech issues  . Ulcerative colitis ([place])   . Wears contact lenses   . Wears partial dentures    upper    Outpatient Medications Prior to Visit  Medication Sig Dispense Refill  . aspirin EC 81 MG EC tablet Take 1 tablet (81 mg total) by mouth daily.    Marland Kitchen atorvastatin (LIPITOR) 40 MG tablet TAKE 1 TABLET DAILY. MUST SCHEDULE PHYSICAL. 90 tablet 3  . benzonatate (TESSALON) 200 MG capsule Take 1 capsule (200 mg total) by mouth 3 (three) times daily as needed for cough. 30 capsule 0  . bictegravir-emtricitabine-tenofovir AF (BIKTARVY) 50-200-25 MG TABS tablet Take 1 tablet by mouth daily. 30 tablet 11  . butalbital-acetaminophen-caffeine (FIORICET) 50-325-40 MG tablet Take 1-2 tablets by mouth every 6 (six) hours as needed for headache. 20 tablet 0  .  cetirizine (ZYRTEC) 10 MG tablet Take 10 mg by mouth daily.    . fluticasone (FLONASE) 50 MCG/ACT nasal spray Place 1 spray into both nostrils daily as needed for allergies or rhinitis.    Marland Kitchen FREESTYLE LITE test strip TEST BLOOD SUGAR TWICE DAILY AS DIRECTED 100 each 11  . glipiZIDE (GLUCOTROL) 10 MG tablet TAKE 1 TABLET TWO TIMES DAILY BEFORE MEALS. MUST SCHEDULE PHYSICAL 180 tablet 3  . glucose 4 GM chewable tablet Chew 1 tablet (4 g total) by mouth as needed for low blood sugar. 30 tablet 3  . KLOR-CON M20 20 MEQ tablet TAKE  1 TABLET DAILY. MUST SCHEDULE PHYSICAL. 90 tablet 3  . mirtazapine (REMERON) 15 MG tablet Take 2 tablets (30 mg total) by mouth at bedtime. 180 tablet 3  . sitaGLIPtin (JANUVIA) 100 MG tablet Take 1 tablet (100 mg total) by mouth daily. 30 tablet 2  . topiramate (TOPAMAX) 100 MG tablet TAKE 1 TABLET AT BEDTIME AS NEEDED 90 tablet 3   No facility-administered medications prior to visit.     Allergies  Allergen Reactions  . Azithromycin Itching    Muscle aches  . Erythromycin Nausea And Vomiting  . Penicillins Nausea And Vomiting    Did it involve swelling of the face/tongue/throat, SOB, or low BP? No Did it involve sudden or severe rash/hives, skin peeling, or any reaction on the inside of your mouth or nose? No Did you need to seek medical attention at a hospital or doctor's office? No When did it last happen?childhood allergy If all above answers are "NO", may proceed with cephalosporin use.   . Tetracycline Nausea And Vomiting    Social History   Tobacco Use  . Smoking status: Never Smoker  . Smokeless tobacco: Never Used  Vaping Use  . Vaping Use: Never used  Substance Use Topics  . Alcohol use: Not Currently    Alcohol/week: 0.0 standard drinks  . Drug use: No    Objective:   Vitals:   12/11/20 0855  Weight: 155 lb 9.6 oz (70.6 kg)   Body mass index is 28.46 kg/m.  Physical Exam Constitutional:      Appearance: Normal appearance. [name] is not ill-appearing.  HENT:     Mouth/Throat:     Mouth: Mucous membranes are moist.     Pharynx: Oropharynx is clear.  Eyes:     General: No scleral icterus. Cardiovascular:     Rate and Rhythm: Normal rate.  Pulmonary:     Effort: Pulmonary effort is normal.  Abdominal:     General: Bowel sounds are normal. There is no distension.  Neurological:     Mental Status: [name] is oriented to person, place, and time.  Psychiatric:        Mood and Affect: Mood normal.        Thought Content: Thought content normal.      Lab Results: HIV 1 RNA Quant  Date Value  11/26/2020 Not Detected Copies/mL  05/28/2020 <20 Copies/mL  10/31/2019 <20 NOT DETECTED copies/mL   CD4 T Cell Abs (/uL)  Date Value  11/26/2020 952  05/28/2020 876  10/31/2019 886    Lab Results  Component Value Date   WBC 5.7 11/26/2020   HGB 12.4 11/26/2020   HCT 38.1 11/26/2020   MCV 94.1 11/26/2020   PLT 250 11/26/2020    Lab Results  Component Value Date   CREATININE 1.04 (H) 11/26/2020   BUN 17 11/26/2020   NA 139 11/26/2020   K 4.5  11/26/2020   CL 106 11/26/2020   CO2 28 11/26/2020    Lab Results  Component Value Date   ALT 22 11/26/2020   AST 12 11/26/2020   ALKPHOS 104 09/25/2020   BILITOT 0.3 11/26/2020     Assessment & Plan:   Problem List Items Addressed This Visit   None    No follow-ups on file.   Janene Madeira, MSN, NP-C [facility] for Infectious [place] Pager: [phone] Office: [phone]  12/11/20  8:57 AM

## 2020-12-25 NOTE — Telephone Encounter (Signed)
Patient sees Baity at other practice.

## 2021-02-18 DIAGNOSIS — E089 Diabetes mellitus due to underlying condition without complications: Secondary | ICD-10-CM | POA: Diagnosis not present

## 2021-02-18 DIAGNOSIS — H2513 Age-related nuclear cataract, bilateral: Secondary | ICD-10-CM | POA: Diagnosis not present

## 2021-02-18 DIAGNOSIS — H524 Presbyopia: Secondary | ICD-10-CM | POA: Diagnosis not present

## 2021-02-18 DIAGNOSIS — E119 Type 2 diabetes mellitus without complications: Secondary | ICD-10-CM | POA: Diagnosis not present

## 2021-02-19 LAB — HM DIABETES EYE EXAM

## 2021-02-22 DIAGNOSIS — H16143 Punctate keratitis, bilateral: Secondary | ICD-10-CM | POA: Diagnosis not present

## 2021-02-22 DIAGNOSIS — H11433 Conjunctival hyperemia, bilateral: Secondary | ICD-10-CM | POA: Diagnosis not present

## 2021-02-22 DIAGNOSIS — H2513 Age-related nuclear cataract, bilateral: Secondary | ICD-10-CM | POA: Diagnosis not present

## 2021-02-25 ENCOUNTER — Other Ambulatory Visit: Payer: Self-pay | Admitting: Internal Medicine

## 2021-02-25 DIAGNOSIS — E1165 Type 2 diabetes mellitus with hyperglycemia: Secondary | ICD-10-CM

## 2021-02-25 NOTE — Telephone Encounter (Signed)
   Notes to clinic:  script has expired  Review for continued use and refill    Requested Prescriptions  Pending Prescriptions Disp Refills   FREESTYLE LITE test strip [Pharmacy Med Name: FREESTYLE LITE TEST  TES]  10    Sig: TEST BLOOD SUGAR TWICE DAILY AS DIRECTED     There is no refill protocol information for this order

## 2021-03-01 ENCOUNTER — Other Ambulatory Visit: Payer: Self-pay

## 2021-03-01 MED ORDER — SITAGLIPTIN PHOSPHATE 100 MG PO TABS: 100.0000 mg | ORAL_TABLET | Freq: Every day | ORAL | 2 refills | Status: DC

## 2021-03-07 ENCOUNTER — Ambulatory Visit: Payer: Self-pay | Admitting: *Deleted

## 2021-03-07 NOTE — Telephone Encounter (Signed)
Attempted to reach pt, left VM to CB.

## 2021-03-07 NOTE — Telephone Encounter (Signed)
Pt reports BS 350 few minutes prior to calling. Reports usually in 100s. Is not on insulin. No missed doses of oral agents.States "Feels "Little jittery." Reports probably what I ate today." Denies any other symptoms. Home care advise given. Pt will hydrate. Placed in [place] que. Reviewed symptoms that warrant an ED visit. Assured pt NT would route to practice for PCPs review. Pt verbalizes understanding.        Reason for Disposition  [1] Blood glucose > 300 mg/dL (16.7 mmol/L) AND [2] does not  use insulin (e.g., not insulin-dependent; most people with type 2 diabetes)  Answer Assessment - Initial Assessment Questions 1. BLOOD GLUCOSE: "What is your blood glucose level?"      350 2. ONSET: "When did you check the blood glucose?"     5 minutes prior to call 3. USUAL RANGE: "What is your glucose level usually?" (e.g., usual fasting morning value, usual evening value)     100 4. KETONES: "Do you check for ketones (urine or blood test strips)?" If yes, ask: "What does the test show now?"      no 5. TYPE 1 or 2:  "Do you know what type of diabetes you have?"  (e.g., Type 1, Type 2, Gestational; doesn't know)      2 6. INSULIN: "Do you take insulin?" "What type of insulin(s) do you use? What is the mode of delivery? (syringe, pen; injection or pump)?"      no 7. DIABETES PILLS: "Do you take any pills for your diabetes?" If yes, ask: "Have you missed taking any pills recently?"     Yes. No missed doses 8. OTHER SYMPTOMS: "Do you have any symptoms?" (e.g., fever, frequent urination, difficulty breathing, dizziness, weakness, vomiting)     "Feel little jittery"  Protocols used: Diabetes - High Blood Sugar-A-AH

## 2021-03-08 ENCOUNTER — Ambulatory Visit: Payer: Self-pay | Admitting: *Deleted

## 2021-03-08 NOTE — Telephone Encounter (Signed)
I spoke with the patient and she informed me that her blood sugar today is 176 fasting. She deny any symptoms of elevated blood sugar, but very concern with her bs been 176 mg/dl fasting. I informed the patient per [facility] recommendations that although this is an elevated reading it is not an emergency. She needs to watch her diet and limit her sugar/carb intake . Also take all medications as directed. I scheduled her an appt for Tuesday, August 30th at 9:20am.

## 2021-03-08 NOTE — Telephone Encounter (Signed)
If persistent elevated blood sugars, she may want to schedule a follow-up with me to discuss

## 2021-03-08 NOTE — Telephone Encounter (Signed)
Called transferred patient did not respond , no answer from patient . Called patient back to review symptoms. No answer, left voicemail to call clinic back.

## 2021-03-12 ENCOUNTER — Ambulatory Visit: Payer: BC Managed Care – PPO | Admitting: Internal Medicine

## 2021-03-15 DIAGNOSIS — H43813 Vitreous degeneration, bilateral: Secondary | ICD-10-CM | POA: Diagnosis not present

## 2021-03-15 DIAGNOSIS — H25813 Combined forms of age-related cataract, bilateral: Secondary | ICD-10-CM | POA: Diagnosis not present

## 2021-03-15 DIAGNOSIS — H18601 Keratoconus, unspecified, right eye: Secondary | ICD-10-CM | POA: Diagnosis not present

## 2021-03-15 DIAGNOSIS — E119 Type 2 diabetes mellitus without complications: Secondary | ICD-10-CM | POA: Diagnosis not present

## 2021-03-21 ENCOUNTER — Encounter: Payer: Self-pay | Admitting: Internal Medicine

## 2021-03-21 ENCOUNTER — Other Ambulatory Visit: Payer: Self-pay

## 2021-03-21 ENCOUNTER — Ambulatory Visit (INDEPENDENT_AMBULATORY_CARE_PROVIDER_SITE_OTHER): Payer: BC Managed Care – PPO | Admitting: Internal Medicine

## 2021-03-21 VITALS — BP 136/72 | HR 86 | Temp 97.5°F | Resp 17 | Ht 62.0 in | Wt 158.0 lb

## 2021-03-21 DIAGNOSIS — E118 Type 2 diabetes mellitus with unspecified complications: Secondary | ICD-10-CM

## 2021-03-21 DIAGNOSIS — I10 Essential (primary) hypertension: Secondary | ICD-10-CM | POA: Insufficient documentation

## 2021-03-21 DIAGNOSIS — E1165 Type 2 diabetes mellitus with hyperglycemia: Secondary | ICD-10-CM

## 2021-03-21 DIAGNOSIS — E782 Mixed hyperlipidemia: Secondary | ICD-10-CM | POA: Diagnosis not present

## 2021-03-21 DIAGNOSIS — L309 Dermatitis, unspecified: Secondary | ICD-10-CM | POA: Insufficient documentation

## 2021-03-21 DIAGNOSIS — Z6828 Body mass index (BMI) 28.0-28.9, adult: Secondary | ICD-10-CM | POA: Insufficient documentation

## 2021-03-21 DIAGNOSIS — IMO0002 Reserved for concepts with insufficient information to code with codable children: Secondary | ICD-10-CM

## 2021-03-21 DIAGNOSIS — N183 Chronic kidney disease, stage 3 unspecified: Secondary | ICD-10-CM | POA: Insufficient documentation

## 2021-03-21 DIAGNOSIS — L308 Other specified dermatitis: Secondary | ICD-10-CM

## 2021-03-21 DIAGNOSIS — I63511 Cerebral infarction due to unspecified occlusion or stenosis of right middle cerebral artery: Secondary | ICD-10-CM | POA: Diagnosis not present

## 2021-03-21 DIAGNOSIS — E6609 Other obesity due to excess calories: Secondary | ICD-10-CM | POA: Insufficient documentation

## 2021-03-21 DIAGNOSIS — Z6827 Body mass index (BMI) 27.0-27.9, adult: Secondary | ICD-10-CM | POA: Insufficient documentation

## 2021-03-21 DIAGNOSIS — Z6826 Body mass index (BMI) 26.0-26.9, adult: Secondary | ICD-10-CM | POA: Insufficient documentation

## 2021-03-21 DIAGNOSIS — E663 Overweight: Secondary | ICD-10-CM | POA: Insufficient documentation

## 2021-03-21 LAB — POCT GLYCOSYLATED HEMOGLOBIN (HGB A1C): Hemoglobin A1C: 8.1 % — AB (ref 4.0–5.6)

## 2021-03-21 MED ORDER — TRIAMCINOLONE ACETONIDE 0.1 % EX CREA: 1.0000 "application " | TOPICAL_CREAM | Freq: Two times a day (BID) | CUTANEOUS | 0 refills | Status: DC

## 2021-03-21 MED ORDER — INSULIN PEN NEEDLE 31G X 5 MM MISC: 0 refills | Status: DC

## 2021-03-21 MED ORDER — AMLODIPINE BESYLATE 5 MG PO TABS: 5.0000 mg | ORAL_TABLET | Freq: Every day | ORAL | 0 refills | Status: DC

## 2021-03-21 MED ORDER — TRULICITY 0.75 MG/0.5ML ~~LOC~~ SOAJ: 0.7500 mg | SUBCUTANEOUS | 1 refills | Status: DC

## 2021-03-21 NOTE — Assessment & Plan Note (Signed)
Manual repeat improved but not at goal Will start Amlodipine 5 mg daily Reinforced DASH diet and exercise for weight loss  RTC in 2 weeks for follow-up hypertension and diabetes

## 2021-03-21 NOTE — Assessment & Plan Note (Signed)
Rx for Triamcinolone cream twice daily

## 2021-03-21 NOTE — Patient Instructions (Signed)

## 2021-03-21 NOTE — Progress Notes (Signed)
Subjective:    Patient ID: [name], female    DOB: [birth year], 65 y.o.   MRN: [id number]  HPI  Patient presents to the clinic today for follow-up of DM2 and HLD.  DM2: Her last A1c was 7.8%, 09/2020.  She is taking Glipizide and Januvia as prescribed.  Her sugars range 174-350.  She checks her feet routinely.  Her last eye exam was 02/2021.  Flu 05/2020.  Pneumovax 02/2018.  Prevnar: 04/2019.  COVID Moderna x3.  HLD with History of Stroke: Her last LDL was 70, triglycerides 185, 09/2020.  She does have some residual left-sided weakness.  She is taking Aspirin and Atorvastatin as prescribed.  She tries to consume a low-fat diet.  Of note, her BP today is 150/75. She has had some elevated blood pressures in the past. She denies headache, dizziness, visual changes, chest pain or SOB.  She also reports a rash of her bilateral feet. She reports the rash itches but does not burn. The rash has not seem to spread. She has put anti itch cream on it with some relief of symptoms.  Review of Systems     Past Medical History:  Diagnosis Date   Arthritis    fingers   Asthma    Diabetes mellitus without complication ([place]) 01-27-95   hx. NIDDM-dx. 3 weeks ago   Headache    migraines - none since stroke   HIV (human immunodeficiency virus infection) ([place])    Hyperlipidemia    hx   Seasonal allergies    Stroke ([place]) 02/18/2018   Residual Left side weakness and speech issues   Ulcerative colitis ([place])    Wears contact lenses    Wears partial dentures    upper    Current Outpatient Medications  Medication Sig Dispense Refill   aspirin EC 81 MG EC tablet Take 1 tablet (81 mg total) by mouth daily.     atorvastatin (LIPITOR) 40 MG tablet TAKE 1 TABLET DAILY. MUST SCHEDULE PHYSICAL. 90 tablet 3   benzonatate (TESSALON) 200 MG capsule Take 1 capsule (200 mg total) by mouth 3 (three) times daily as needed for cough. 30 capsule 0   bictegravir-emtricitabine-tenofovir AF (BIKTARVY) 50-200-25  MG TABS tablet Take 1 tablet by mouth daily. 30 tablet 11   butalbital-acetaminophen-caffeine (FIORICET) 50-325-40 MG tablet Take 1-2 tablets by mouth every 6 (six) hours as needed for headache. 20 tablet 0   cetirizine (ZYRTEC) 10 MG tablet Take 10 mg by mouth daily.     fluticasone (FLONASE) 50 MCG/ACT nasal spray Place 1 spray into both nostrils daily as needed for allergies or rhinitis.     FREESTYLE LITE test strip TEST BLOOD SUGAR TWICE DAILY AS DIRECTED 100 strip 10   glipiZIDE (GLUCOTROL) 10 MG tablet TAKE 1 TABLET TWO TIMES DAILY BEFORE MEALS. MUST SCHEDULE PHYSICAL 180 tablet 3   glucose 4 GM chewable tablet Chew 1 tablet (4 g total) by mouth as needed for low blood sugar. 30 tablet 3   KLOR-CON M20 20 MEQ tablet TAKE 1 TABLET DAILY. MUST SCHEDULE PHYSICAL. 90 tablet 3   mirtazapine (REMERON) 15 MG tablet Take 2 tablets (30 mg total) by mouth at bedtime. 180 tablet 3   sitaGLIPtin (JANUVIA) 100 MG tablet Take 1 tablet (100 mg total) by mouth daily. 90 tablet 2   topiramate (TOPAMAX) 100 MG tablet TAKE 1 TABLET AT BEDTIME AS NEEDED 90 tablet 3   No current facility-administered medications for this visit.    Allergies  Allergen Reactions  Azithromycin Itching    Muscle aches   Erythromycin Nausea And Vomiting   Penicillins Nausea And Vomiting    Did it involve swelling of the face/tongue/throat, SOB, or low BP? No Did it involve sudden or severe rash/hives, skin peeling, or any reaction on the inside of your mouth or nose? No Did you need to seek medical attention at a hospital or doctor's office? No When did it last happen?      childhood allergy If all above answers are "NO", may proceed with cephalosporin use.    Tetracycline Nausea And Vomiting    Family History  Problem Relation Age of Onset   Hypertension Mother    Diabetes Mother    Cancer Father        lung ca   Seizures Daughter        60yr    Social History   Socioeconomic History   Marital status:  Legally Separated    Spouse name: Not on file   Number of children: Not on file   Years of education: Not on file   Highest education level: Not on file  Occupational History    Comment: disabled  Tobacco Use   Smoking status: Never   Smokeless tobacco: Never  Vaping Use   Vaping Use: Never used  Substance and Sexual Activity   Alcohol use: Not Currently    Alcohol/week: 0.0 standard drinks   Drug use: No   Sexual activity: Not Currently    Partners: Male    Comment: offered condoms  Other Topics Concern   Not on file  Social History Narrative   Not on file   Social Determinants of Health   Financial Resource Strain: Not on file  Food Insecurity: Not on file  Transportation Needs: Not on file  Physical Activity: Not on file  Stress: Not on file  Social Connections: Not on file  Intimate Partner Violence: Not on file     Constitutional: Pt reports malaise. Denies fever, fatigue, headache or abrupt weight changes.  HEENT: Denies eye pain, eye redness, ear pain, ringing in the ears, wax buildup, runny nose, nasal congestion, bloody nose, or sore throat. Respiratory: Denies difficulty breathing, shortness of breath, cough or sputum production.   Cardiovascular: Denies chest pain, chest tightness, palpitations or swelling in the hands or feet.  Gastrointestinal: Pt reports decreased appetite. Denies abdominal pain, bloating, constipation, diarrhea or blood in the stool.  GU: Pt reports urinary frequency. Denies urgency, pain with urination, burning sensation, blood in urine, odor or discharge. Musculoskeletal: Patient reports left-sided weakness.  Denies decrease in range of motion, difficulty with gait, muscle pain or joint pain and swelling.  Skin: Pt reports rash of feet. Denies redness, lesions or ulcercations.  Neurological: Denies dizziness, difficulty with memory, difficulty with speech or problems with balance and coordination.    No other specific complaints in a  complete review of systems (except as listed in HPI above).  Objective:   Physical Exam BP (!) 150/75 (BP Location: Right Arm, Patient Position: Sitting, Cuff Size: Normal)   Pulse 86   Temp (!) 97.5 F (36.4 C) (Oral)   Resp 17   Ht 5' 2"  (1.575 m)   Wt 158 lb (71.7 kg)   SpO2 100%   BMI 28.90 kg/m   Wt Readings from Last 3 Encounters:  12/11/20 155 lb 9.6 oz (70.6 kg)  11/15/20 154 lb (69.9 kg)  09/25/20 156 lb (70.8 kg)    General: Appears her stated age, well  developed, well nourished in NAD. Skin: Warm, dry and intact. Hyperpigmented maculopapular rash noted of bilateral heels. HEENT: Head: normal shape and size; Eyes: sclera white and EOMs intact;  Cardiovascular: Normal rate and rhythm. S1,S2 noted.  No murmur, rubs or gallops noted. No JVD or BLE edema. No carotid bruits noted. Pulmonary/Chest: Normal effort and positive vesicular breath sounds. No respiratory distress. No wheezes, rales or ronchi noted.  Musculoskeletal: Strength 5/5 BUE. Hand grips unequal R>L. Strength 4/5 LLE, 5/5 RLE,  No difficulty with gait.  Neurological: Alert and oriented.   BMET    Component Value Date/Time   NA 139 11/26/2020 0956   NA 138 06/23/2014 0503   K 4.5 11/26/2020 0956   K 4.2 06/23/2014 0503   CL 106 11/26/2020 0956   CL 104 06/23/2014 0503   CO2 28 11/26/2020 0956   CO2 27 06/23/2014 0503   GLUCOSE 133 (H) 11/26/2020 0956   GLUCOSE 246 (H) 06/23/2014 0503   BUN 17 11/26/2020 0956   BUN 17 06/23/2014 0503   CREATININE 1.04 (H) 11/26/2020 0956   CALCIUM 10.0 11/26/2020 0956   CALCIUM 8.7 06/23/2014 0503   GFRNONAA 57 (L) 11/26/2020 0956   GFRAA 66 11/26/2020 0956    Lipid Panel     Component Value Date/Time   CHOL 154 09/25/2020 1241   CHOL 211 (H) 06/22/2014 1432   TRIG 185.0 (H) 09/25/2020 1241   TRIG 473 (H) 06/22/2014 1432   HDL 47.20 09/25/2020 1241   HDL 42 06/22/2014 1432   CHOLHDL 3 09/25/2020 1241   VLDL 37.0 09/25/2020 1241   VLDL SEE COMMENT  06/22/2014 1432   LDLCALC 70 09/25/2020 1241   LDLCALC 60 10/31/2019 1436   LDLCALC SEE COMMENT 06/22/2014 1432    CBC    Component Value Date/Time   WBC 5.7 11/26/2020 0956   RBC 4.05 11/26/2020 0956   HGB 12.4 11/26/2020 0956   HGB 12.2 06/23/2014 0503   HCT 38.1 11/26/2020 0956   HCT 37.2 06/23/2014 0503   PLT 250 11/26/2020 0956   PLT 251 06/23/2014 0503   MCV 94.1 11/26/2020 0956   MCV 89 06/23/2014 0503   MCH 30.6 11/26/2020 0956   MCHC 32.5 11/26/2020 0956   RDW 13.1 11/26/2020 0956   RDW 13.6 06/23/2014 0503   LYMPHSABS 1,984 11/26/2020 0956   LYMPHSABS 1.8 06/23/2014 0503   MONOABS 0.6 03/01/2019 1730   MONOABS 0.4 06/23/2014 0503   EOSABS 137 11/26/2020 0956   EOSABS 0.0 06/23/2014 0503   BASOSABS 29 11/26/2020 0956   BASOSABS 0.0 06/23/2014 0503    Hgb A1C Lab Results  Component Value Date   HGBA1C 7.8 (H) 09/25/2020           Assessment & Plan:   Webb Silversmith, NP This visit occurred during the SARS-CoV-2 public health emergency.  Safety protocols were in place, including screening questions prior to the visit, additional usage of staff PPE, and extensive cleaning of exam room while observing appropriate contact time as indicated for disinfecting solutions.

## 2021-03-21 NOTE — Assessment & Plan Note (Signed)
POCT A1c 8.1% Encourage low-carb diet and exercise for weight loss Continue Glipizide and Januvia We will add Trulicity 4.17 mg weekly She will schedule nurse visit for injection teaching Encourage routine eye exams Encourage routine foot exams Encouraged her to get a flu shot in the fall Pneumonia vaccines UTD Encouraged her to get a COVID booster

## 2021-03-21 NOTE — Assessment & Plan Note (Signed)
Encourage diet and exercise for weight loss 

## 2021-03-21 NOTE — Assessment & Plan Note (Signed)
Lipid profile reviewed Encouraged her to consume a low-fat diet Continue Atorvastatin and ASA

## 2021-03-21 NOTE — Assessment & Plan Note (Signed)
Lipid profile reviewed Encouraged her to consume a low-fat diet Continue Atorvastatin

## 2021-04-04 ENCOUNTER — Ambulatory Visit: Payer: BC Managed Care – PPO | Admitting: Internal Medicine

## 2021-04-04 NOTE — Progress Notes (Deleted)
Subjective:    Patient ID: [name], female    DOB: Nov 09, 1955, 65 y.o.   MRN: [id number]  HPI  Pt presents to the clinic today for follow up of HTN and DM 2. At her last visit, she was started on Amlodipine 5 mg daily. She has been taking the medication as prescribed and denies adverse side effects. Her BP today is. ECG from 02/2019 reviewed. She was also started on Trulicity in addition to her Glipizide and Januvia.  Review of Systems     Past Medical History:  Diagnosis Date   Arthritis    fingers   Asthma    Diabetes mellitus without complication ([place]) 07-16-13   hx. NIDDM-dx. 3 weeks ago   Headache    migraines - none since stroke   HIV (human immunodeficiency virus infection) ([place])    Hyperlipidemia    hx   Seasonal allergies    Stroke ([place]) 02/18/2018   Residual Left side weakness and speech issues   Ulcerative colitis ([place])    Wears contact lenses    Wears partial dentures    upper    Current Outpatient Medications  Medication Sig Dispense Refill   amLODipine (NORVASC) 5 MG tablet Take 1 tablet (5 mg total) by mouth daily. 30 tablet 0   aspirin EC 81 MG EC tablet Take 1 tablet (81 mg total) by mouth daily.     atorvastatin (LIPITOR) 40 MG tablet TAKE 1 TABLET DAILY. MUST SCHEDULE PHYSICAL. 90 tablet 3   bictegravir-emtricitabine-tenofovir AF (BIKTARVY) 50-200-25 MG TABS tablet Take 1 tablet by mouth daily. 30 tablet 11   butalbital-acetaminophen-caffeine (FIORICET) 50-325-40 MG tablet Take 1-2 tablets by mouth every 6 (six) hours as needed for headache. 20 tablet 0   cetirizine (ZYRTEC) 10 MG tablet Take 10 mg by mouth daily.     Dulaglutide (TRULICITY) 9.45 OP/9.2TW SOPN Inject 0.75 mg into the skin once a week. 3 mL 1   fluticasone (FLONASE) 50 MCG/ACT nasal spray Place 1 spray into both nostrils daily as needed for allergies or rhinitis.     FREESTYLE LITE test strip TEST BLOOD SUGAR TWICE DAILY AS DIRECTED 100 strip 10   glipiZIDE (GLUCOTROL) 10 MG  tablet TAKE 1 TABLET TWO TIMES DAILY BEFORE MEALS. MUST SCHEDULE PHYSICAL 180 tablet 3   glucose 4 GM chewable tablet Chew 1 tablet (4 g total) by mouth as needed for low blood sugar. 30 tablet 3   Insulin Pen Needle 31G X 5 MM MISC BD Pen Needles- brand specific Inject insulin via insulin pen 6 x daily 30 each 0   KLOR-CON M20 20 MEQ tablet TAKE 1 TABLET DAILY. MUST SCHEDULE PHYSICAL. 90 tablet 3   mirtazapine (REMERON) 15 MG tablet Take 2 tablets (30 mg total) by mouth at bedtime. 180 tablet 3   sitaGLIPtin (JANUVIA) 100 MG tablet Take 1 tablet (100 mg total) by mouth daily. 90 tablet 2   topiramate (TOPAMAX) 100 MG tablet TAKE 1 TABLET AT BEDTIME AS NEEDED 90 tablet 3   triamcinolone cream (KENALOG) 0.1 % Apply 1 application topically 2 (two) times daily. 30 g 0   No current facility-administered medications for this visit.    Allergies  Allergen Reactions   Azithromycin Itching    Muscle aches   Erythromycin Nausea And Vomiting   Penicillins Nausea And Vomiting    Did it involve swelling of the face/tongue/throat, SOB, or low BP? No Did it involve sudden or severe rash/hives, skin peeling, or any reaction on the  inside of your mouth or nose? No Did you need to seek medical attention at a hospital or doctor's office? No When did it last happen?      childhood allergy If all above answers are "NO", may proceed with cephalosporin use.    Tetracycline Nausea And Vomiting    Family History  Problem Relation Age of Onset   Hypertension Mother    Diabetes Mother    Cancer Father        lung ca   Seizures Daughter        97yr    Social History   Socioeconomic History   Marital status: Legally Separated    Spouse name: Not on file   Number of children: Not on file   Years of education: Not on file   Highest education level: Not on file  Occupational History    Comment: disabled  Tobacco Use   Smoking status: Never   Smokeless tobacco: Never  Vaping Use   Vaping Use:  Never used  Substance and Sexual Activity   Alcohol use: Not Currently    Alcohol/week: 0.0 standard drinks   Drug use: No   Sexual activity: Not Currently    Partners: Male    Comment: offered condoms  Other Topics Concern   Not on file  Social History Narrative   Not on file   Social Determinants of Health   Financial Resource Strain: Not on file  Food Insecurity: Not on file  Transportation Needs: Not on file  Physical Activity: Not on file  Stress: Not on file  Social Connections: Not on file  Intimate Partner Violence: Not on file     Constitutional: Denies fever, malaise, fatigue, headache or abrupt weight changes.  HEENT: Denies eye pain, eye redness, ear pain, ringing in the ears, wax buildup, runny nose, nasal congestion, bloody nose, or sore throat. Respiratory: Denies difficulty breathing, shortness of breath, cough or sputum production.   Cardiovascular: Denies chest pain, chest tightness, palpitations or swelling in the hands or feet.  Gastrointestinal: Denies abdominal pain, bloating, constipation, diarrhea or blood in the stool.  GU: Denies urgency, frequency, pain with urination, burning sensation, blood in urine, odor or discharge. Musculoskeletal: Denies decrease in range of motion, difficulty with gait, muscle pain or joint pain and swelling.  Skin: Denies redness, rashes, lesions or ulcercations.  Neurological: Denies dizziness, difficulty with memory, difficulty with speech or problems with balance and coordination.  Psych: Denies anxiety, depression, SI/HI.  No other specific complaints in a complete review of systems (except as listed in HPI above).  Objective:   Physical Exam   There were no vitals taken for this visit. Wt Readings from Last 3 Encounters:  03/21/21 158 lb (71.7 kg)  12/11/20 155 lb 9.6 oz (70.6 kg)  11/15/20 154 lb (69.9 kg)    General: Appears their stated age, well developed, well nourished in NAD. Skin: Warm, dry and  intact. No rashes, lesions or ulcerations noted. HEENT: Head: normal shape and size; Eyes: sclera white, no icterus, conjunctiva pink, PERRLA and EOMs intact; Ears: Tm's gray and intact, normal light reflex; Nose: mucosa pink and moist, septum midline; Throat/Mouth: Teeth present, mucosa pink and moist, no exudate, lesions or ulcerations noted.  Neck:  Neck supple, trachea midline. No masses, lumps or thyromegaly present.  Cardiovascular: Normal rate and rhythm. S1,S2 noted.  No murmur, rubs or gallops noted. No JVD or BLE edema. No carotid bruits noted. Pulmonary/Chest: Normal effort and positive vesicular breath sounds. No  respiratory distress. No wheezes, rales or ronchi noted.  Abdomen: Soft and nontender. Normal bowel sounds. No distention or masses noted. Liver, spleen and kidneys non palpable. Musculoskeletal: Normal range of motion. No signs of joint swelling. No difficulty with gait.  Neurological: Alert and oriented. Cranial nerves II-XII grossly intact. Coordination normal.  Psychiatric: Mood and affect normal. Behavior is normal. Judgment and thought content normal.   EKG:  BMET    Component Value Date/Time   NA 139 11/26/2020 0956   NA 138 06/23/2014 0503   K 4.5 11/26/2020 0956   K 4.2 06/23/2014 0503   CL 106 11/26/2020 0956   CL 104 06/23/2014 0503   CO2 28 11/26/2020 0956   CO2 27 06/23/2014 0503   GLUCOSE 133 (H) 11/26/2020 0956   GLUCOSE 246 (H) 06/23/2014 0503   BUN 17 11/26/2020 0956   BUN 17 06/23/2014 0503   CREATININE 1.04 (H) 11/26/2020 0956   CALCIUM 10.0 11/26/2020 0956   CALCIUM 8.7 06/23/2014 0503   GFRNONAA 57 (L) 11/26/2020 0956   GFRAA 66 11/26/2020 0956    Lipid Panel     Component Value Date/Time   CHOL 154 09/25/2020 1241   CHOL 211 (H) 06/22/2014 1432   TRIG 185.0 (H) 09/25/2020 1241   TRIG 473 (H) 06/22/2014 1432   HDL 47.20 09/25/2020 1241   HDL 42 06/22/2014 1432   CHOLHDL 3 09/25/2020 1241   VLDL 37.0 09/25/2020 1241   VLDL SEE  COMMENT 06/22/2014 1432   LDLCALC 70 09/25/2020 1241   LDLCALC 60 10/31/2019 1436   LDLCALC SEE COMMENT 06/22/2014 1432    CBC    Component Value Date/Time   WBC 5.7 11/26/2020 0956   RBC 4.05 11/26/2020 0956   HGB 12.4 11/26/2020 0956   HGB 12.2 06/23/2014 0503   HCT 38.1 11/26/2020 0956   HCT 37.2 06/23/2014 0503   PLT 250 11/26/2020 0956   PLT 251 06/23/2014 0503   MCV 94.1 11/26/2020 0956   MCV 89 06/23/2014 0503   MCH 30.6 11/26/2020 0956   MCHC 32.5 11/26/2020 0956   RDW 13.1 11/26/2020 0956   RDW 13.6 06/23/2014 0503   LYMPHSABS 1,984 11/26/2020 0956   LYMPHSABS 1.8 06/23/2014 0503   MONOABS 0.6 03/01/2019 1730   MONOABS 0.4 06/23/2014 0503   EOSABS 137 11/26/2020 0956   EOSABS 0.0 06/23/2014 0503   BASOSABS 29 11/26/2020 0956   BASOSABS 0.0 06/23/2014 0503    Hgb A1C Lab Results  Component Value Date   HGBA1C 8.1 (A) 03/21/2021           Assessment & Plan:   Webb Silversmith, NP This visit occurred during the SARS-CoV-2 public health emergency.  Safety protocols were in place, including screening questions prior to the visit, additional usage of staff PPE, and extensive cleaning of exam room while observing appropriate contact time as indicated for disinfecting solutions.

## 2021-04-10 ENCOUNTER — Ambulatory Visit (INDEPENDENT_AMBULATORY_CARE_PROVIDER_SITE_OTHER): Payer: BC Managed Care – PPO | Admitting: Internal Medicine

## 2021-04-10 ENCOUNTER — Encounter: Payer: Self-pay | Admitting: Internal Medicine

## 2021-04-10 ENCOUNTER — Other Ambulatory Visit: Payer: Self-pay

## 2021-04-10 VITALS — BP 124/76 | HR 83 | Temp 97.7°F | Resp 17 | Ht 62.0 in | Wt 157.0 lb

## 2021-04-10 DIAGNOSIS — I10 Essential (primary) hypertension: Secondary | ICD-10-CM

## 2021-04-10 DIAGNOSIS — E782 Mixed hyperlipidemia: Secondary | ICD-10-CM

## 2021-04-10 DIAGNOSIS — N1831 Chronic kidney disease, stage 3a: Secondary | ICD-10-CM

## 2021-04-10 DIAGNOSIS — Z6828 Body mass index (BMI) 28.0-28.9, adult: Secondary | ICD-10-CM

## 2021-04-10 DIAGNOSIS — Z23 Encounter for immunization: Secondary | ICD-10-CM

## 2021-04-10 DIAGNOSIS — E663 Overweight: Secondary | ICD-10-CM

## 2021-04-10 DIAGNOSIS — IMO0002 Reserved for concepts with insufficient information to code with codable children: Secondary | ICD-10-CM

## 2021-04-10 DIAGNOSIS — I63511 Cerebral infarction due to unspecified occlusion or stenosis of right middle cerebral artery: Secondary | ICD-10-CM

## 2021-04-10 DIAGNOSIS — E1165 Type 2 diabetes mellitus with hyperglycemia: Secondary | ICD-10-CM

## 2021-04-10 DIAGNOSIS — E118 Type 2 diabetes mellitus with unspecified complications: Secondary | ICD-10-CM

## 2021-04-10 NOTE — Assessment & Plan Note (Signed)
Controlled on Amlodipine CMET today  Reinforced DASH diet and exercise for weight loss

## 2021-04-10 NOTE — Assessment & Plan Note (Signed)
Sugars have improved since she started on Trulicity Encouraged her to consume a low carb diet and exercise for weight loss Continue Glipizide, Januvia and Trulicity Encouraged routine eye exam Encouraged routine foot exam Flu shot today Pneumonia vaccines UTD Encouraged her to get her Covid booster

## 2021-04-10 NOTE — Assessment & Plan Note (Signed)
CMET and lipid profile today Encouraged low fat diet Continue Atorvastatin

## 2021-04-10 NOTE — Assessment & Plan Note (Signed)
CMET and lipid profile today Encouraged her to consume a low fat diet Continue Atorvastatin and ASA

## 2021-04-10 NOTE — Assessment & Plan Note (Signed)
CMET today No currently on ACEI/ARB

## 2021-04-10 NOTE — Assessment & Plan Note (Signed)
Encouraged diet and exercise for weight loss ?

## 2021-04-10 NOTE — Patient Instructions (Signed)
Carbohydrate Counting for Diabetes Mellitus, Adult Carbohydrate counting is a method of keeping track of how many carbohydrates you eat. Eating carbohydrates naturally increases the amount of sugar (glucose) in the blood. Counting how many carbohydrates you eat improves your blood glucose control, which helps you manage your diabetes. It is important to know how many carbohydrates you can safely have in each meal. This is different for every person. A dietitian can help you make a meal plan and calculate how many carbohydrates you should have at each meal and snack. What foods contain carbohydrates? Carbohydrates are found in the following foods: Grains, such as breads and cereals. Dried beans and soy products. Starchy vegetables, such as potatoes, peas, and corn. Fruit and fruit juices. Milk and yogurt. Sweets and snack foods, such as cake, cookies, candy, chips, and soft drinks. How do I count carbohydrates in foods? There are two ways to count carbohydrates in food. You can read food labels or learn standard serving sizes of foods. You can use either of the methods or a combination of both. Using the Nutrition Facts label The Nutrition Facts list is included on the labels of almost all packaged foods and beverages in the [place] It includes: The serving size. Information about nutrients in each serving, including the grams (g) of carbohydrate per serving. To use the Nutrition Facts: Decide how many servings you will have. Multiply the number of servings by the number of carbohydrates per serving. The resulting number is the total amount of carbohydrates that you will be having. Learning the standard serving sizes of foods When you eat carbohydrate foods that are not packaged or do not include Nutrition Facts on the label, you need to measure the servings in order to count the amount of carbohydrates. Measure the foods that you will eat with a food scale or measuring cup, if needed. Decide how  many standard-size servings you will eat. Multiply the number of servings by 15. For foods that contain carbohydrates, one serving equals 15 g of carbohydrates. For example, if you eat 2 cups or 10 oz (300 g) of strawberries, you will have eaten 2 servings and 30 g of carbohydrates (2 servings x 15 g = 30 g). For foods that have more than one food mixed, such as soups and casseroles, you must count the carbohydrates in each food that is included. The following list contains standard serving sizes of common carbohydrate-rich foods. Each of these servings has about 15 g of carbohydrates: 1 slice of bread. 1 six-inch (15 cm) tortilla. ? cup or 2 oz (53 g) cooked rice or pasta.  cup or 3 oz (85 g) cooked or canned, drained and rinsed beans or lentils.  cup or 3 oz (85 g) starchy vegetable, such as peas, corn, or squash.  cup or 4 oz (120 g) hot cereal.  cup or 3 oz (85 g) boiled or mashed potatoes, or  or 3 oz (85 g) of a large baked potato.  cup or 4 fl oz (118 mL) fruit juice. 1 cup or 8 fl oz (237 mL) milk. 1 small or 4 oz (106 g) apple.  or 2 oz (63 g) of a medium banana. 1 cup or 5 oz (150 g) strawberries. 3 cups or 1 oz (24 g) popped popcorn. What is an example of carbohydrate counting? To calculate the number of carbohydrates in this sample meal, follow the steps shown below. Sample meal 3 oz (85 g) chicken breast. ? cup or 4 oz (106 g) brown  rice.  cup or 3 oz (85 g) corn. 1 cup or 8 fl oz (237 mL) milk. 1 cup or 5 oz (150 g) strawberries with sugar-free whipped topping. Carbohydrate calculation Identify the foods that contain carbohydrates: Rice. Corn. Milk. Strawberries. Calculate how many servings you have of each food: 2 servings rice. 1 serving corn. 1 serving milk. 1 serving strawberries. Multiply each number of servings by 15 g: 2 servings rice x 15 g = 30 g. 1 serving corn x 15 g = 15 g. 1 serving milk x 15 g = 15 g. 1 serving strawberries x 15 g = 15  g. Add together all of the amounts to find the total grams of carbohydrates eaten: 30 g + 15 g + 15 g + 15 g = 75 g of carbohydrates total. What are tips for following this plan? Shopping Develop a meal plan and then make a shopping list. Buy fresh and frozen vegetables, fresh and frozen fruit, dairy, eggs, beans, lentils, and whole grains. Look at food labels. Choose foods that have more fiber and less sugar. Avoid processed foods and foods with added sugars. Meal planning Aim to have the same amount of carbohydrates at each meal and for each snack time. Plan to have regular, balanced meals and snacks. Where to find more information American Diabetes Association: www.diabetes.org Centers for Disease Control and Prevention: http://www.wolf.info/ Summary Carbohydrate counting is a method of keeping track of how many carbohydrates you eat. Eating carbohydrates naturally increases the amount of sugar (glucose) in the blood. Counting how many carbohydrates you eat improves your blood glucose control, which helps you manage your diabetes. A dietitian can help you make a meal plan and calculate how many carbohydrates you should have at each meal and snack. This information is not intended to replace advice given to you by your health care provider. Make sure you discuss any questions you have with your health care provider. Document Revised: 06/30/2019 Document Reviewed: 07/01/2019 Elsevier Patient Education  2021 [employer].

## 2021-04-10 NOTE — Progress Notes (Signed)
Subjective:    Patient ID: [name], female    DOB: 27-Jul-1955, 65 y.o.   MRN: [id number]  HPI  Patient presents the clinic today for follow-up of HTN, HLD, DM2 and CKD.  HTN: Her BP today is 124/76.  She is taking Amlodipine as prescribed.  ECG from 02/2019 reviewed.  HLD with History of Stroke: Residual left-sided weakness.  Her last LDL was 70, triglycerides 185, 09/2020.  She denies myalgias on Atorvastatin.  She does not consume a low-fat diet.  DM2: Her last A1c was 8.1%, 03/2021.  She is taking Glipizide, Januvia and Trulicity as prescribed. She reports the Trulicity is causing fatigue and nausea. Her sugars 65-135. She checks her feet routinely.  Her last eye exam was 03/2021, [facility].  Flu 05/2020.  Pneumovax 02/2018.  Prevnar 04/2019. COVID Moderna x 3.  CKD: Her last creatinine was 1.04, GFR 57, 11/2020.  She does not follow with nephrology.  Review of Systems     Past Medical History:  Diagnosis Date   Arthritis    fingers   Asthma    Diabetes mellitus without complication ([place]) 5-36-46   hx. NIDDM-dx. 3 weeks ago   Headache    migraines - none since stroke   HIV (human immunodeficiency virus infection) ([place])    Hyperlipidemia    hx   Seasonal allergies    Stroke ([place]) 02/18/2018   Residual Left side weakness and speech issues   Ulcerative colitis ([place])    Wears contact lenses    Wears partial dentures    upper    Current Outpatient Medications  Medication Sig Dispense Refill   amLODipine (NORVASC) 5 MG tablet Take 1 tablet (5 mg total) by mouth daily. 30 tablet 0   aspirin EC 81 MG EC tablet Take 1 tablet (81 mg total) by mouth daily.     atorvastatin (LIPITOR) 40 MG tablet TAKE 1 TABLET DAILY. MUST SCHEDULE PHYSICAL. 90 tablet 3   bictegravir-emtricitabine-tenofovir AF (BIKTARVY) 50-200-25 MG TABS tablet Take 1 tablet by mouth daily. 30 tablet 11   butalbital-acetaminophen-caffeine (FIORICET) 50-325-40 MG tablet Take 1-2 tablets by mouth  every 6 (six) hours as needed for headache. 20 tablet 0   cetirizine (ZYRTEC) 10 MG tablet Take 10 mg by mouth daily.     Dulaglutide (TRULICITY) 8.03 OZ/2.2QM SOPN Inject 0.75 mg into the skin once a week. 3 mL 1   fluticasone (FLONASE) 50 MCG/ACT nasal spray Place 1 spray into both nostrils daily as needed for allergies or rhinitis.     FREESTYLE LITE test strip TEST BLOOD SUGAR TWICE DAILY AS DIRECTED 100 strip 10   glipiZIDE (GLUCOTROL) 10 MG tablet TAKE 1 TABLET TWO TIMES DAILY BEFORE MEALS. MUST SCHEDULE PHYSICAL 180 tablet 3   glucose 4 GM chewable tablet Chew 1 tablet (4 g total) by mouth as needed for low blood sugar. 30 tablet 3   Insulin Pen Needle 31G X 5 MM MISC BD Pen Needles- brand specific Inject insulin via insulin pen 6 x daily 30 each 0   KLOR-CON M20 20 MEQ tablet TAKE 1 TABLET DAILY. MUST SCHEDULE PHYSICAL. 90 tablet 3   mirtazapine (REMERON) 15 MG tablet Take 2 tablets (30 mg total) by mouth at bedtime. 180 tablet 3   sitaGLIPtin (JANUVIA) 100 MG tablet Take 1 tablet (100 mg total) by mouth daily. 90 tablet 2   topiramate (TOPAMAX) 100 MG tablet TAKE 1 TABLET AT BEDTIME AS NEEDED 90 tablet 3   triamcinolone cream (KENALOG)  0.1 % Apply 1 application topically 2 (two) times daily. 30 g 0   No current facility-administered medications for this visit.    Allergies  Allergen Reactions   Azithromycin Itching    Muscle aches   Erythromycin Nausea And Vomiting   Penicillins Nausea And Vomiting    Did it involve swelling of the face/tongue/throat, SOB, or low BP? No Did it involve sudden or severe rash/hives, skin peeling, or any reaction on the inside of your mouth or nose? No Did you need to seek medical attention at a hospital or doctor's office? No When did it last happen?      childhood allergy If all above answers are "NO", may proceed with cephalosporin use.    Tetracycline Nausea And Vomiting    Family History  Problem Relation Age of Onset   Hypertension Mother     Diabetes Mother    Cancer Father        lung ca   Seizures Daughter        67yr    Social History   Socioeconomic History   Marital status: Legally Separated    Spouse name: Not on file   Number of children: Not on file   Years of education: Not on file   Highest education level: Not on file  Occupational History    Comment: disabled  Tobacco Use   Smoking status: Never   Smokeless tobacco: Never  Vaping Use   Vaping Use: Never used  Substance and Sexual Activity   Alcohol use: Not Currently    Alcohol/week: 0.0 standard drinks   Drug use: No   Sexual activity: Not Currently    Partners: Male    Comment: offered condoms  Other Topics Concern   Not on file  Social History Narrative   Not on file   Social Determinants of Health   Financial Resource Strain: Not on file  Food Insecurity: Not on file  Transportation Needs: Not on file  Physical Activity: Not on file  Stress: Not on file  Social Connections: Not on file  Intimate Partner Violence: Not on file     Constitutional: Pt reports headaches, fatigue. Denies fever, malaise, headache or abrupt weight changes.  Respiratory: Denies difficulty breathing, shortness of breath, cough or sputum production.   Cardiovascular: Denies chest pain, chest tightness, palpitations or swelling in the hands or feet.  Gastrointestinal: Pt reports nausea. Denies abdominal pain, bloating, constipation, diarrhea or blood in the stool.  GU: Denies urgency, frequency, pain with urination, burning sensation, blood in urine, odor or discharge. Skin: Denies redness, rashes, lesions or ulcercations.  Neurological: Pt reports left sided weakness. Denies dizziness, difficulty with memory, difficulty with speech or problems with balance and coordination.  Psych: Pt has a history of depression. Denies anxiety, SI/HI.  No other specific complaints in a complete review of systems (except as listed in HPI above).  Objective:   Physical  Exam BP 124/76 (BP Location: Right Arm, Patient Position: Sitting, Cuff Size: Normal)   Pulse 83   Temp 97.7 F (36.5 C) (Temporal)   Resp 17   Ht 5' 2"  (1.575 m)   Wt 157 lb (71.2 kg)   SpO2 100%   BMI 28.72 kg/m   Wt Readings from Last 3 Encounters:  03/21/21 158 lb (71.7 kg)  12/11/20 155 lb 9.6 oz (70.6 kg)  11/15/20 154 lb (69.9 kg)    General: Appears her stated age, overweight, in NAD. Skin: Warm, dry and intact. No ulcerations noted.  HEENT: Head: normal shape and size; Eyes: sclera white and EOMs intact;  Cardiovascular: Normal rate and rhythm. S1,S2 noted.  No murmur, rubs or gallops noted. No JVD or BLE edema. No carotid bruits noted. Pulmonary/Chest: Normal effort and positive vesicular breath sounds. No respiratory distress. No wheezes, rales or ronchi noted.  Neurological: Alert and oriented.   BMET    Component Value Date/Time   NA 139 11/26/2020 0956   NA 138 06/23/2014 0503   K 4.5 11/26/2020 0956   K 4.2 06/23/2014 0503   CL 106 11/26/2020 0956   CL 104 06/23/2014 0503   CO2 28 11/26/2020 0956   CO2 27 06/23/2014 0503   GLUCOSE 133 (H) 11/26/2020 0956   GLUCOSE 246 (H) 06/23/2014 0503   BUN 17 11/26/2020 0956   BUN 17 06/23/2014 0503   CREATININE 1.04 (H) 11/26/2020 0956   CALCIUM 10.0 11/26/2020 0956   CALCIUM 8.7 06/23/2014 0503   GFRNONAA 57 (L) 11/26/2020 0956   GFRAA 66 11/26/2020 0956    Lipid Panel     Component Value Date/Time   CHOL 154 09/25/2020 1241   CHOL 211 (H) 06/22/2014 1432   TRIG 185.0 (H) 09/25/2020 1241   TRIG 473 (H) 06/22/2014 1432   HDL 47.20 09/25/2020 1241   HDL 42 06/22/2014 1432   CHOLHDL 3 09/25/2020 1241   VLDL 37.0 09/25/2020 1241   VLDL SEE COMMENT 06/22/2014 1432   LDLCALC 70 09/25/2020 1241   LDLCALC 60 10/31/2019 1436   LDLCALC SEE COMMENT 06/22/2014 1432    CBC    Component Value Date/Time   WBC 5.7 11/26/2020 0956   RBC 4.05 11/26/2020 0956   HGB 12.4 11/26/2020 0956   HGB 12.2 06/23/2014 0503    HCT 38.1 11/26/2020 0956   HCT 37.2 06/23/2014 0503   PLT 250 11/26/2020 0956   PLT 251 06/23/2014 0503   MCV 94.1 11/26/2020 0956   MCV 89 06/23/2014 0503   MCH 30.6 11/26/2020 0956   MCHC 32.5 11/26/2020 0956   RDW 13.1 11/26/2020 0956   RDW 13.6 06/23/2014 0503   LYMPHSABS 1,984 11/26/2020 0956   LYMPHSABS 1.8 06/23/2014 0503   MONOABS 0.6 03/01/2019 1730   MONOABS 0.4 06/23/2014 0503   EOSABS 137 11/26/2020 0956   EOSABS 0.0 06/23/2014 0503   BASOSABS 29 11/26/2020 0956   BASOSABS 0.0 06/23/2014 0503    Hgb A1C Lab Results  Component Value Date   HGBA1C 8.1 (A) 03/21/2021           Assessment & Plan:    Webb Silversmith, NP This visit occurred during the SARS-CoV-2 public health emergency.  Safety protocols were in place, including screening questions prior to the visit, additional usage of staff PPE, and extensive cleaning of exam room while observing appropriate contact time as indicated for disinfecting solutions.

## 2021-04-11 LAB — LIPID PANEL
Cholesterol: 187 mg/dL (ref <200)
HDL: 48 mg/dL — ABNORMAL LOW (ref >=50)
LDL Cholesterol (Calc): 103 mg/dL (calc) — ABNORMAL HIGH
Non-HDL Cholesterol (Calc): 139 mg/dL (calc) — ABNORMAL HIGH (ref <130)
Total CHOL/HDL Ratio: 3.9 (calc) (ref <5.0)
Triglycerides: 250 mg/dL — ABNORMAL HIGH (ref <150)

## 2021-04-11 LAB — COMPLETE METABOLIC PANEL WITH GFR
AG Ratio: 1.5 (calc) (ref 1.0–2.5)
ALT: 16 U/L (ref 6–29)
AST: 16 U/L (ref 10–35)
Albumin: 4.3 g/dL (ref 3.6–5.1)
Alkaline phosphatase (APISO): 112 U/L (ref 37–153)
BUN/Creatinine Ratio: 19 (calc) (ref 6–22)
BUN: 20 mg/dL (ref 7–25)
CO2: 25 mmol/L (ref 20–32)
Calcium: 9.1 mg/dL (ref 8.6–10.4)
Chloride: 108 mmol/L (ref 98–110)
Creat: 1.06 mg/dL — ABNORMAL HIGH (ref 0.50–1.05)
Globulin: 2.9 g/dL (calc) (ref 1.9–3.7)
Glucose, Bld: 83 mg/dL (ref 65–99)
Potassium: 4.1 mmol/L (ref 3.5–5.3)
Sodium: 141 mmol/L (ref 135–146)
Total Bilirubin: 0.4 mg/dL (ref 0.2–1.2)
Total Protein: 7.2 g/dL (ref 6.1–8.1)
eGFR: 59 mL/min/{1.73_m2} — ABNORMAL LOW (ref >=60)

## 2021-04-12 MED ORDER — ROSUVASTATIN CALCIUM 10 MG PO TABS: 10.0000 mg | ORAL_TABLET | Freq: Every day | ORAL | 0 refills | Status: DC

## 2021-04-12 NOTE — Addendum Note (Signed)
Addended by: Jearld Fenton on: 04/12/2021 09:23 AM   Modules accepted: Orders

## 2021-04-16 ENCOUNTER — Ambulatory Visit (INDEPENDENT_AMBULATORY_CARE_PROVIDER_SITE_OTHER): Payer: BC Managed Care – PPO | Admitting: Internal Medicine

## 2021-04-16 ENCOUNTER — Other Ambulatory Visit: Payer: Self-pay

## 2021-04-16 ENCOUNTER — Encounter: Payer: Self-pay | Admitting: Internal Medicine

## 2021-04-16 VITALS — BP 120/72 | HR 88 | Temp 97.5°F | Resp 17 | Ht 62.0 in | Wt 155.0 lb

## 2021-04-16 DIAGNOSIS — T50905A Adverse effect of unspecified drugs, medicaments and biological substances, initial encounter: Secondary | ICD-10-CM

## 2021-04-16 DIAGNOSIS — E663 Overweight: Secondary | ICD-10-CM

## 2021-04-16 DIAGNOSIS — E1165 Type 2 diabetes mellitus with hyperglycemia: Secondary | ICD-10-CM

## 2021-04-16 DIAGNOSIS — Z6828 Body mass index (BMI) 28.0-28.9, adult: Secondary | ICD-10-CM

## 2021-04-16 NOTE — Assessment & Plan Note (Signed)
Uncontrolled but having side effects and hypoglycemia on Trulicity Will hold next dose Continue Glipizide and Januvia Encouraged low carb diet

## 2021-04-16 NOTE — Progress Notes (Signed)
Subjective:    Patient ID: [name], female    DOB: Aug 28, 1955, 65 y.o.   MRN: [id number]  HPI  Patient presents the clinic today with complaint of fatigue, headaches, nausea and diarrhea.  She reports this started ever since she started Trulicity. She reports in the past, the symptoms will decrease a few days after she took her Trulicity, but reports after this last dose, her symptoms are persistent and worse. She is taking Glipizide and Januvia. She reports hypoglycemia with blood sugars of 65-70 daily. She denies other recent changes in medication, diet or lifestyle factors.  Review of Systems     Past Medical History:  Diagnosis Date   Arthritis    fingers   Asthma    Diabetes mellitus without complication ([place]) 11-13-75   hx. NIDDM-dx. 3 weeks ago   Headache    migraines - none since stroke   HIV (human immunodeficiency virus infection) ([place])    Hyperlipidemia    hx   Seasonal allergies    Stroke ([place]) 02/18/2018   Residual Left side weakness and speech issues   Ulcerative colitis ([place])    Wears contact lenses    Wears partial dentures    upper    Current Outpatient Medications  Medication Sig Dispense Refill   amLODipine (NORVASC) 5 MG tablet Take 1 tablet (5 mg total) by mouth daily. 30 tablet 0   aspirin EC 81 MG EC tablet Take 1 tablet (81 mg total) by mouth daily.     bictegravir-emtricitabine-tenofovir AF (BIKTARVY) 50-200-25 MG TABS tablet Take 1 tablet by mouth daily. 30 tablet 11   butalbital-acetaminophen-caffeine (FIORICET) 50-325-40 MG tablet Take 1-2 tablets by mouth every 6 (six) hours as needed for headache. 20 tablet 0   cetirizine (ZYRTEC) 10 MG tablet Take 10 mg by mouth daily.     Dulaglutide (TRULICITY) 4.12 IN/8.6VE SOPN Inject 0.75 mg into the skin once a week. 3 mL 1   fluticasone (FLONASE) 50 MCG/ACT nasal spray Place 1 spray into both nostrils daily as needed for allergies or rhinitis.     FREESTYLE LITE test strip TEST BLOOD SUGAR  TWICE DAILY AS DIRECTED 100 strip 10   glipiZIDE (GLUCOTROL) 10 MG tablet TAKE 1 TABLET TWO TIMES DAILY BEFORE MEALS. MUST SCHEDULE PHYSICAL 180 tablet 3   glucose 4 GM chewable tablet Chew 1 tablet (4 g total) by mouth as needed for low blood sugar. 30 tablet 3   Insulin Pen Needle 31G X 5 MM MISC BD Pen Needles- brand specific Inject insulin via insulin pen 6 x daily 30 each 0   KLOR-CON M20 20 MEQ tablet TAKE 1 TABLET DAILY. MUST SCHEDULE PHYSICAL. 90 tablet 3   mirtazapine (REMERON) 15 MG tablet Take 2 tablets (30 mg total) by mouth at bedtime. 180 tablet 3   rosuvastatin (CRESTOR) 10 MG tablet Take 1 tablet (10 mg total) by mouth daily. 90 tablet 0   sitaGLIPtin (JANUVIA) 100 MG tablet Take 1 tablet (100 mg total) by mouth daily. 90 tablet 2   topiramate (TOPAMAX) 100 MG tablet TAKE 1 TABLET AT BEDTIME AS NEEDED 90 tablet 3   triamcinolone cream (KENALOG) 0.1 % Apply 1 application topically 2 (two) times daily. 30 g 0   No current facility-administered medications for this visit.    Allergies  Allergen Reactions   Azithromycin Itching    Muscle aches   Erythromycin Nausea And Vomiting   Penicillins Nausea And Vomiting    Did it involve swelling of the  face/tongue/throat, SOB, or low BP? No Did it involve sudden or severe rash/hives, skin peeling, or any reaction on the inside of your mouth or nose? No Did you need to seek medical attention at a hospital or doctor's office? No When did it last happen?      childhood allergy If all above answers are "NO", may proceed with cephalosporin use.    Tetracycline Nausea And Vomiting    Family History  Problem Relation Age of Onset   Hypertension Mother    Diabetes Mother    Cancer Father        lung ca   Seizures Daughter        49yr    Social History   Socioeconomic History   Marital status: Legally Separated    Spouse name: Not on file   Number of children: Not on file   Years of education: Not on file   Highest education  level: Not on file  Occupational History    Comment: disabled  Tobacco Use   Smoking status: Never   Smokeless tobacco: Never  Vaping Use   Vaping Use: Never used  Substance and Sexual Activity   Alcohol use: Not Currently    Alcohol/week: 0.0 standard drinks   Drug use: No   Sexual activity: Not Currently    Partners: Male    Comment: offered condoms  Other Topics Concern   Not on file  Social History Narrative   Not on file   Social Determinants of Health   Financial Resource Strain: Not on file  Food Insecurity: Not on file  Transportation Needs: Not on file  Physical Activity: Not on file  Stress: Not on file  Social Connections: Not on file  Intimate Partner Violence: Not on file     Constitutional: Patient reports headache and  fatigue.  Denies fever, malaise, or abrupt weight changes.  Respiratory: Denies difficulty breathing, shortness of breath, cough or sputum production.   Cardiovascular: Denies chest pain, chest tightness, palpitations or swelling in the hands or feet.  Gastrointestinal: Patient reports nausea and diarrhea.  Denies abdominal pain, bloating, constipation, or blood in the stool.  Neurological: Denies dizziness, difficulty with memory, difficulty with speech or problems with balance and coordination.    No other specific complaints in a complete review of systems (except as listed in HPI above).  Objective:   Physical Exam  BP 120/72 (BP Location: Right Arm, Patient Position: Sitting, Cuff Size: Normal)   Pulse 88   Temp (!) 97.5 F (36.4 C) (Temporal)   Resp 17   Ht 5' 2"  (1.575 m)   Wt 155 lb (70.3 kg)   SpO2 100%   BMI 28.35 kg/m   Wt Readings from Last 3 Encounters:  04/10/21 157 lb (71.2 kg)  03/21/21 158 lb (71.7 kg)  12/11/20 155 lb 9.6 oz (70.6 kg)    General: Appears her stated age, overweight, in NAD. Skin: Warm, dry and intact. No noted. HEENT: Head: normal shape and size; Cardiovascular: Normal rate and rhythm.  S1,S2 noted.  No murmur, rubs or gallops noted. Pulmonary/Chest: Normal effort and positive vesicular breath sounds. No respiratory distress. No wheezes, rales or ronchi noted.  Abdomen: Soft and nontender. Normal bowel sounds.  Neurological: Alert and oriented.   BMET    Component Value Date/Time   NA 141 04/10/2021 0854   NA 138 06/23/2014 0503   K 4.1 04/10/2021 0854   K 4.2 06/23/2014 0503   CL 108 04/10/2021 0854  CL 104 06/23/2014 0503   CO2 25 04/10/2021 0854   CO2 27 06/23/2014 0503   GLUCOSE 83 04/10/2021 0854   GLUCOSE 246 (H) 06/23/2014 0503   BUN 20 04/10/2021 0854   BUN 17 06/23/2014 0503   CREATININE 1.06 (H) 04/10/2021 0854   CALCIUM 9.1 04/10/2021 0854   CALCIUM 8.7 06/23/2014 0503   GFRNONAA 57 (L) 11/26/2020 0956   GFRAA 66 11/26/2020 0956    Lipid Panel     Component Value Date/Time   CHOL 187 04/10/2021 0854   CHOL 211 (H) 06/22/2014 1432   TRIG 250 (H) 04/10/2021 0854   TRIG 473 (H) 06/22/2014 1432   HDL 48 (L) 04/10/2021 0854   HDL 42 06/22/2014 1432   CHOLHDL 3.9 04/10/2021 0854   VLDL 37.0 09/25/2020 1241   VLDL SEE COMMENT 06/22/2014 1432   LDLCALC 103 (H) 04/10/2021 0854   LDLCALC SEE COMMENT 06/22/2014 1432    CBC    Component Value Date/Time   WBC 5.7 11/26/2020 0956   RBC 4.05 11/26/2020 0956   HGB 12.4 11/26/2020 0956   HGB 12.2 06/23/2014 0503   HCT 38.1 11/26/2020 0956   HCT 37.2 06/23/2014 0503   PLT 250 11/26/2020 0956   PLT 251 06/23/2014 0503   MCV 94.1 11/26/2020 0956   MCV 89 06/23/2014 0503   MCH 30.6 11/26/2020 0956   MCHC 32.5 11/26/2020 0956   RDW 13.1 11/26/2020 0956   RDW 13.6 06/23/2014 0503   LYMPHSABS 1,984 11/26/2020 0956   LYMPHSABS 1.8 06/23/2014 0503   MONOABS 0.6 03/01/2019 1730   MONOABS 0.4 06/23/2014 0503   EOSABS 137 11/26/2020 0956   EOSABS 0.0 06/23/2014 0503   BASOSABS 29 11/26/2020 0956   BASOSABS 0.0 06/23/2014 0503    Hgb A1C Lab Results  Component Value Date   HGBA1C 8.1 (A)  03/21/2021          Assessment & Plan:   Medication Side Effect:  Will have her hold her next Trulicity dose and see if symptoms improve Encouraged rest and fluids Consume a bland diet  Update me next week and let me know how you are doing Webb Silversmith, NP This visit occurred during the SARS-CoV-2 public health emergency.  Safety protocols were in place, including screening questions prior to the visit, additional usage of staff PPE, and extensive cleaning of exam room while observing appropriate contact time as indicated for disinfecting solutions.

## 2021-04-16 NOTE — Patient Instructions (Signed)
Basics of Medicine Management Taking your medicines correctly is an important part of managing or preventing medical problems. Make sure you know what disease or condition your medicine is treating, and how and when to take it. If you do not take your medicine correctly, it may not work well and may cause unpleasant side effects, including serious health problems. What should I do when I am taking medicines?  Read all the labels and inserts that come with your medicines. Review the information often. Talk with your pharmacist if you get a refill and notice a change in the size, color, or shape of your medicines. Know the potential side effects for each medicine that you take. Try to get all your medicines from the same pharmacy. The pharmacist will have all your information and will understand how your medicines will affect each other (interact). Tell your health care provider about all your medicines, including over-the-counter medicines, vitamins, and herbal or dietary supplements. He or she will make sure that nothing will interact with any of your prescribed medicines. How can I take my medicines safely? Take medicines only as told by your health care provider. Do not take more of your medicine than instructed. Do not take anyone else's medicines. Do not share your medicines with others. Do not stop taking your medicines unless your health care provider tells you to do so. You may need to avoid alcohol or certain foods or liquids when taking certain medicines. Follow your health care provider's instructions. Do not split, mash, or chew your medicines unless your health care provider tells you to do so. Tell your health care provider if you have trouble swallowing your medicines. For liquid medicine, use the dosing container that was provided. How should I organize my medicines? Know your medicines Know what each of your medicines looks like. This includes size, color, and shape. Tell your  health care provider if you are having trouble recognizing all the medicines that you are taking. If you cannot tell your medicines apart because they look similar, keep them in original bottles. If you cannot read the labels on the bottles, tell your pharmacist to put your medicines in containers with large print. Review your medicines and your schedule with family members, a friend, or a caregiver. Use a pill organizer Use a tool to organize your medicine schedule. Tools include a weekly pillbox, a written chart, a notebook, or a calendar. Your tool should help you remember the following things about each medicine: The name of the medicine. The amount (dose) to take. The schedule. This is the day and time the medicine should be taken. The appearance. This includes color, shape, size, and stamp. How to take your medicines. This includes instructions to take them with food, without food, with fluids, or with other medicines. Create reminders for taking your medicines. Use sticky notes, or alarms on your watch, mobile device, or phone calendar. You may choose to use a more advanced management system. These systems have storage, alarms, and visual and audio prompts. Some medicines can be taken on an "as-needed" basis. These include medicines for nausea or pain. If you take an as-needed medicine, write down the name and dose, as well as the date and time that you took it. How should I plan for travel? Take your pillbox, medicines, and organization system with you when traveling. Have your medicines refilled before you travel. This will ensure that you do not run out of your medicines while you are away from home. Always  carry an updated list of your medicines with you. If there is an emergency, a first responder can quickly see what medicines you are taking. Do not pack your medicines in checked luggage in case your luggage is lost or delayed. If any of your medicines is considered a controlled  substance, make sure you bring a letter from your health care provider with you. How should I store and discard my medicines? For safe storage: Store medicines in a cool, dry area away from light, or as directed by your health care provider. Do not store medicines in the bathroom. Heat and humidity will affect them. Do not store your medicines with other chemicals, or with medicines for pets or other household members. Keep medicines away from children and pets. Do not leave them on counters or bedside tables. Store them in high cabinets or on high shelves. For safe disposal: Check expiration dates regularly. Do not take expired medicines. Discard medicines that are older than the expiration date. Learn a safe way to dispose of your medicines. You may: Use a local government, hospital, or pharmacy medicine-take-back program. Mix the medicines with inedible substances, put them in a sealed bag or empty container, and throw them in the trash. What should I remember? Tell your health care provider if you: Experience side effects. Have new symptoms. Have other concerns about taking your medicines. Review your medicines regularly with your health care provider. Other medicines, diet, medical conditions, weight changes, and daily habits can all affect how medicines work. Ask if you need to continue taking each medicine, and discuss how well each one is working. Refill your medicines early to avoid running out of them. In case of an accidental overdose, call your local [facility] at [phone] or visit your local emergency department immediately. This is important. Summary Taking your medicines correctly is an important part of managing or preventing medical problems. You need to make sure that you understand what you are taking a medicine for, as well as how and when you need to take it. Know your medicines and use a pill organizer to help you take your medicines correctly. In case of  an accidental overdose, call your local [facility] at [phone] or visit your local emergency department immediately. This is important. This information is not intended to replace advice given to you by your health care provider. Make sure you discuss any questions you have with your health care provider. Document Revised: 05/21/2020 Document Reviewed: 05/21/2020 Elsevier Patient Education  2022 [employer].

## 2021-04-16 NOTE — Assessment & Plan Note (Signed)
Encouraged diet and exercise for weight loss ?

## 2021-04-17 ENCOUNTER — Other Ambulatory Visit: Payer: Self-pay | Admitting: Internal Medicine

## 2021-04-17 ENCOUNTER — Ambulatory Visit: Payer: Self-pay | Admitting: *Deleted

## 2021-04-17 MED ORDER — AMLODIPINE BESYLATE 5 MG PO TABS: 5.0000 mg | ORAL_TABLET | Freq: Every day | ORAL | 0 refills | Status: DC

## 2021-04-17 NOTE — Telephone Encounter (Signed)
Pt was taking atorvastatin but received a new Rx today for rosuvastatin (CRESTOR) 10 MG tablet / pt wanted to speak with the nurse to ask what she needs to do with the old atorvastatin medication / please advise   Called patient to review questions regarding medication disposal. Patient requesting to clarify she is to only take rosuvastatin 10 mg instead of atorvastatin. Encouraged patient to call a local pharmacy to find out protocol for disposal of non narcotic medications. Care advise given . Patient verbalized understanding of care advise and to call back if needed.

## 2021-04-17 NOTE — Telephone Encounter (Signed)
Medication Refill - Medication: amLODipine (NORVASC) 5 MG tablet, patient completley out.   Has the patient contacted their pharmacy? Yes.    (Agent: If yes, when and what did the pharmacy advise?) Contact PCP office because patient has no refills  Preferred Pharmacy (with phone number or street name):   [id number] - [place], [place] [address] AT [place] Phone:  [phone]  Fax:  [phone]     Has the patient been seen for an appointment in the last year OR does the patient have an upcoming appointment? Yes.    Agent: Please be advised that RX refills may take up to 3 business days. We ask that you follow-up with your pharmacy.

## 2021-04-26 ENCOUNTER — Telehealth: Payer: Self-pay | Admitting: Internal Medicine

## 2021-04-26 NOTE — Telephone Encounter (Signed)
Pt states she was supposed to call back Wednesday in regards to her insulin, and having side effects from trulicity..  Pt would like to know if Rollene Fare has sent in a new Rx for insulin?

## 2021-04-28 ENCOUNTER — Encounter: Payer: Self-pay | Admitting: Internal Medicine

## 2021-04-28 NOTE — Telephone Encounter (Cosign Needed)
She was supposed to let me know if her symptoms resolved with hold the trulicity before I sent in a different medication.

## 2021-04-30 MED ORDER — LEVEMIR FLEXTOUCH 100 UNIT/ML ~~LOC~~ SOPN: 10.0000 [IU] | PEN_INJECTOR | Freq: Every day | SUBCUTANEOUS | 0 refills | Status: DC

## 2021-05-08 DIAGNOSIS — Z23 Encounter for immunization: Secondary | ICD-10-CM | POA: Diagnosis not present

## 2021-05-10 DIAGNOSIS — H18603 Keratoconus, unspecified, bilateral: Secondary | ICD-10-CM | POA: Diagnosis not present

## 2021-05-13 ENCOUNTER — Other Ambulatory Visit: Payer: Self-pay | Admitting: Internal Medicine

## 2021-05-13 NOTE — Telephone Encounter (Signed)
Requested Prescriptions  Pending Prescriptions Disp Refills  . Insulin Pen Needle (B-D UF III MINI PEN NEEDLES) 31G X 5 MM MISC [Pharmacy Med Name: B-D PEN NDL MINI 31GX5MM(3/16)PRPL] 100 each 0    Sig: USE TO INJECT INSULIN SIX TIMES PER DAY     Endocrinology: Diabetes - Testing Supplies Passed - 05/13/2021  6:20 AM      Passed - Valid encounter within last 12 months    Recent Outpatient Visits          3 weeks ago Medication side effect, initial encounter   [facility] [place], Coralie Keens, NP   1 month ago Needs flu shot   [facility] [place], [place] W, NP   1 month ago Diabetes mellitus type 2, uncontrolled, with complications [facility])   [facility] [place], Coralie Keens, NP   5 months ago Seasonal allergic rhinitis due to pollen   [facility], Coralie Keens, NP      Future Appointments            In 1 month Ba[place], Coralie Keens, NP [facility], [facility]

## 2021-05-17 ENCOUNTER — Other Ambulatory Visit: Payer: Self-pay

## 2021-05-17 MED ORDER — AMLODIPINE BESYLATE 5 MG PO TABS: 5.0000 mg | ORAL_TABLET | Freq: Every day | ORAL | 1 refills | Status: DC

## 2021-06-18 ENCOUNTER — Other Ambulatory Visit: Payer: Self-pay | Admitting: Internal Medicine

## 2021-06-18 NOTE — Telephone Encounter (Signed)
Requested Prescriptions  Pending Prescriptions Disp Refills  . rosuvastatin (CRESTOR) 10 MG tablet [Pharmacy Med Name: ROSUVASTATIN TABS 10MG] 90 tablet 2    Sig: TAKE 1 TABLET DAILY     Cardiovascular:  Antilipid - Statins Failed - 06/18/2021 12:35 AM      Failed - LDL in normal range and within 360 days    Ldl Cholesterol, Calc  Date Value Ref Range Status  06/22/2014 SEE COMMENT 0 - 100 mg/dL Final    Comment:    LDL/VLDL - Unable to report VLDL and LDL due to a  - Triglyceride value that is 400 mg/dL or   - greater.    LDL Cholesterol (Calc)  Date Value Ref Range Status  04/10/2021 103 (H) mg/dL (calc) Final    Comment:    Reference range: <100 . Desirable range <100 mg/dL for primary prevention;   <70 mg/dL for patients with CHD or diabetic patients  with > or = 2 CHD risk factors. Marland Kitchen LDL-C is now calculated using the Martin-Hopkins  calculation, which is a validated novel method providing  better accuracy than the Friedewald equation in the  estimation of LDL-C.  Cresenciano Genre et al. Annamaria Helling. 3149;702(63): 2061-2068  (http://education.Quest[employer]ostics.com/faq/FAQ164)    Direct LDL  Date Value Ref Range Status  11/07/2014 76.0 mg/dL Final    Comment:    Optimal:  <100 mg/dLNear or Above Optimal:  100-129 mg/dLBorderline High:  130-159 mg/dLHigh:  160-189 mg/dLVery High:  >190 mg/dL         Failed - HDL in normal range and within 360 days    HDL Cholesterol  Date Value Ref Range Status  06/22/2014 42 40 - 60 mg/dL Final   HDL  Date Value Ref Range Status  04/10/2021 48 (L) > OR = 50 mg/dL Final         Failed - Triglycerides in normal range and within 360 days    Triglycerides  Date Value Ref Range Status  04/10/2021 250 (H) <150 mg/dL Final    Comment:    . If a non-fasting specimen was collected, consider repeat triglyceride testing on a fasting specimen if clinically indicated.  Yates Decamp et al. J. of Clin. Lipidol. 7858;8:502-774. .   06/22/2014 473 (H) 0  - 200 mg/dL Final         Passed - Total Cholesterol in normal range and within 360 days    Cholesterol  Date Value Ref Range Status  04/10/2021 187 <200 mg/dL Final  06/22/2014 211 (H) 0 - 200 mg/dL Final         Passed - Patient is not pregnant      Passed - Valid encounter within last 12 months    Recent Outpatient Visits          2 months ago Medication side effect, initial encounter   [facility] [place], Coralie Keens, NP   2 months ago Needs flu shot   [facility] [place], [place] W, NP   2 months ago Diabetes mellitus type 2, uncontrolled, with complications [facility])   [facility] [place], Coralie Keens, NP   7 months ago Seasonal allergic rhinitis due to pollen   [facility], Coralie Keens, NP      Future Appointments            In 3 weeks Garnette Gunner, Coralie Keens, NP [facility], [facility]

## 2021-06-20 ENCOUNTER — Other Ambulatory Visit: Payer: Self-pay | Admitting: Infectious Diseases

## 2021-06-26 DIAGNOSIS — H18603 Keratoconus, unspecified, bilateral: Secondary | ICD-10-CM | POA: Diagnosis not present

## 2021-07-04 ENCOUNTER — Other Ambulatory Visit: Payer: Self-pay | Admitting: Infectious Diseases

## 2021-07-10 ENCOUNTER — Encounter: Payer: Self-pay | Admitting: Internal Medicine

## 2021-07-10 ENCOUNTER — Ambulatory Visit (INDEPENDENT_AMBULATORY_CARE_PROVIDER_SITE_OTHER): Payer: BC Managed Care – PPO | Admitting: Internal Medicine

## 2021-07-10 ENCOUNTER — Ambulatory Visit
Admission: RE | Admit: 2021-07-10 | Discharge: 2021-07-10 | Disposition: A | Discharge status: Home / Self Care | Payer: BC Managed Care – PPO | Attending: Internal Medicine | Admitting: Internal Medicine

## 2021-07-10 ENCOUNTER — Other Ambulatory Visit: Payer: Self-pay

## 2021-07-10 ENCOUNTER — Ambulatory Visit
Admission: RE | Admit: 2021-07-10 | Discharge: 2021-07-10 | Disposition: A | Discharge status: Home / Self Care | Payer: BC Managed Care – PPO | Source: Ambulatory Visit | Attending: Internal Medicine | Admitting: Internal Medicine

## 2021-07-10 VITALS — BP 118/66 | HR 82 | Temp 97.5°F | Resp 18 | Ht 62.0 in | Wt 153.0 lb

## 2021-07-10 DIAGNOSIS — M25511 Pain in right shoulder: Secondary | ICD-10-CM | POA: Diagnosis not present

## 2021-07-10 DIAGNOSIS — I10 Essential (primary) hypertension: Secondary | ICD-10-CM

## 2021-07-10 DIAGNOSIS — I69354 Hemiplegia and hemiparesis following cerebral infarction affecting left non-dominant side: Secondary | ICD-10-CM

## 2021-07-10 DIAGNOSIS — K51919 Ulcerative colitis, unspecified with unspecified complications: Secondary | ICD-10-CM

## 2021-07-10 DIAGNOSIS — E782 Mixed hyperlipidemia: Secondary | ICD-10-CM

## 2021-07-10 DIAGNOSIS — E1165 Type 2 diabetes mellitus with hyperglycemia: Secondary | ICD-10-CM

## 2021-07-10 DIAGNOSIS — N1831 Chronic kidney disease, stage 3a: Secondary | ICD-10-CM

## 2021-07-10 DIAGNOSIS — Z794 Long term (current) use of insulin: Secondary | ICD-10-CM | POA: Diagnosis not present

## 2021-07-10 DIAGNOSIS — F339 Major depressive disorder, recurrent, unspecified: Secondary | ICD-10-CM

## 2021-07-10 DIAGNOSIS — Z6827 Body mass index (BMI) 27.0-27.9, adult: Secondary | ICD-10-CM

## 2021-07-10 DIAGNOSIS — G43009 Migraine without aura, not intractable, without status migrainosus: Secondary | ICD-10-CM

## 2021-07-10 DIAGNOSIS — E663 Overweight: Secondary | ICD-10-CM

## 2021-07-10 DIAGNOSIS — Z21 Asymptomatic human immunodeficiency virus [HIV] infection status: Secondary | ICD-10-CM

## 2021-07-10 DIAGNOSIS — I63511 Cerebral infarction due to unspecified occlusion or stenosis of right middle cerebral artery: Secondary | ICD-10-CM

## 2021-07-10 NOTE — Assessment & Plan Note (Signed)
Stable on Mirtazapine Support offered

## 2021-07-10 NOTE — Assessment & Plan Note (Signed)
C-Met and lipid profile today Encouraged her to consume a low-fat diet Continue Atorvastatin Will need to discuss addition of Zetia versus Fenofibrate if triglycerides still not at goal

## 2021-07-10 NOTE — Assessment & Plan Note (Signed)
Not an issue off meds

## 2021-07-10 NOTE — Assessment & Plan Note (Signed)
Continue Topamax Will monitor

## 2021-07-10 NOTE — Assessment & Plan Note (Signed)
Followed by ID

## 2021-07-10 NOTE — Assessment & Plan Note (Signed)
Encourage diet and exercise for weight loss 

## 2021-07-10 NOTE — Assessment & Plan Note (Signed)
Controlled on Amlodipine Reinforced DASH diet and exercise for weight loss Will monitor

## 2021-07-10 NOTE — Assessment & Plan Note (Signed)
Continue Amlodipine, Atorvastatin,Gglipizide, Januvia, Levemir and Aspirin

## 2021-07-10 NOTE — Assessment & Plan Note (Signed)
C-Met today Encourage adequate water intake Avoid anti-inflammatories OTC

## 2021-07-10 NOTE — Patient Instructions (Signed)

## 2021-07-10 NOTE — Assessment & Plan Note (Signed)
Residual left-sided weakness Continue Amlodipine, Atorvastatin,Gglipizide, Januvia, Levemir and Aspirin

## 2021-07-10 NOTE — Progress Notes (Signed)
Subjective:    Patient ID: [name], female    DOB: 02-Jul-1956, 65 y.o.   MRN: [id number]  HPI  Patient presents the clinic today for follow-up of chronic conditions.  HTN: Her BP today is 118/66.  She is taking Amlodipine as prescribed.  ECG from 02/2019 reviewed.  HLD with History of Stroke: Residual left-sided weakness.  Her last LDL was 103, triglycerides 250, 03/2021.  She denies myalgias on Atorvastatin.  She does not consume a low-fat diet.  DM2: Her last A1c was 8.1%, 03/2021.  She is taking Glipizide, Januvia and Levemir as prescribed.  Her sugars range 48-143.  She checks her feet routinely.  Her last eye exam was 03/2021.  Flu 03/2021.  Pneumovax 02/2018.  Prevnar 04/2019.  COVID Moderna x3.  CKD: Her last creatinine was 1.06, GFR 59, 03/2021.  She is not taking anything for renal protection.  She does not follow with nephrology.  Migraines: These occur intermittently.  Triggered by stress.  She is taking Topamax as prescribed.  She does not take anything for breakthrough.  She does not follow with neurology  Depression: Chronic, managed on Mirtazapine.  She is not currently seeing a therapist.  She denies anxiety, SI/HI.  HIV: Her last viral load was undetectable, CD4 counts 952, 11/2020.  She is taking Biktarvy as prescribed.  She follows with ID.  Ulcerative Colitis: Currently no issues off meds.  She is not following with GI.  She also reports right shoulder pain. She reports this started 3-4 weeks ago. She describes the pain as sharp and stabbing. The pain is worse with movement or laying on her side. She denies any recent injury to the area. She has not taken anything OTC for this.  Review of Systems     Past Medical History:  Diagnosis Date   Arthritis    fingers   Asthma    Diabetes mellitus without complication ([place]) 2-35-57   hx. NIDDM-dx. 3 weeks ago   Headache    migraines - none since stroke   HIV (human immunodeficiency virus infection) ([place])     Hyperlipidemia    hx   Seasonal allergies    Stroke ([place]) 02/18/2018   Residual Left side weakness and speech issues   Ulcerative colitis ([place])    Wears contact lenses    Wears partial dentures    upper    Current Outpatient Medications  Medication Sig Dispense Refill   amLODipine (NORVASC) 5 MG tablet Take 1 tablet (5 mg total) by mouth daily. 90 tablet 1   aspirin EC 81 MG EC tablet Take 1 tablet (81 mg total) by mouth daily.     BIKTARVY 50-200-25 MG TABS tablet TAKE ONE TABLET BY MOUTH DAILY 30 tablet 0   butalbital-acetaminophen-caffeine (FIORICET) 50-325-40 MG tablet Take 1-2 tablets by mouth every 6 (six) hours as needed for headache. 20 tablet 0   cetirizine (ZYRTEC) 10 MG tablet Take 10 mg by mouth daily.     Dulaglutide (TRULICITY) 3.22 GU/5.4YH SOPN Inject 0.75 mg into the skin once a week. 3 mL 1   fluticasone (FLONASE) 50 MCG/ACT nasal spray Place 1 spray into both nostrils daily as needed for allergies or rhinitis.     FREESTYLE LITE test strip TEST BLOOD SUGAR TWICE DAILY AS DIRECTED 100 strip 10   glipiZIDE (GLUCOTROL) 10 MG tablet TAKE 1 TABLET TWO TIMES DAILY BEFORE MEALS. MUST SCHEDULE PHYSICAL 180 tablet 3   glucose 4 GM chewable tablet Chew 1 tablet (4 g  total) by mouth as needed for low blood sugar. 30 tablet 3   insulin detemir (LEVEMIR FLEXTOUCH) 100 UNIT/ML FlexPen Inject 10 Units into the skin daily. 15 mL 0   Insulin Pen Needle (B-D UF III MINI PEN NEEDLES) 31G X 5 MM MISC USE TO INJECT INSULIN SIX TIMES PER DAY 100 each 0   KLOR-CON M20 20 MEQ tablet TAKE 1 TABLET DAILY. MUST SCHEDULE PHYSICAL. 90 tablet 3   mirtazapine (REMERON) 15 MG tablet Take 2 tablets (30 mg total) by mouth at bedtime. 180 tablet 3   rosuvastatin (CRESTOR) 10 MG tablet TAKE 1 TABLET DAILY 90 tablet 2   sitaGLIPtin (JANUVIA) 100 MG tablet Take 1 tablet (100 mg total) by mouth daily. 90 tablet 2   topiramate (TOPAMAX) 100 MG tablet TAKE 1 TABLET AT BEDTIME AS NEEDED 90 tablet 3    triamcinolone cream (KENALOG) 0.1 % Apply 1 application topically 2 (two) times daily. 30 g 0   No current facility-administered medications for this visit.    Allergies  Allergen Reactions   Azithromycin Itching    Muscle aches   Erythromycin Nausea And Vomiting   Penicillins Nausea And Vomiting    Did it involve swelling of the face/tongue/throat, SOB, or low BP? No Did it involve sudden or severe rash/hives, skin peeling, or any reaction on the inside of your mouth or nose? No Did you need to seek medical attention at a hospital or doctor's office? No When did it last happen?      childhood allergy If all above answers are NO, may proceed with cephalosporin use.    Tetracycline Nausea And Vomiting    Family History  Problem Relation Age of Onset   Hypertension Mother    Diabetes Mother    Cancer Father        lung ca   Seizures Daughter        83yr    Social History   Socioeconomic History   Marital status: Legally Separated    Spouse name: Not on file   Number of children: Not on file   Years of education: Not on file   Highest education level: Not on file  Occupational History    Comment: disabled  Tobacco Use   Smoking status: Never   Smokeless tobacco: Never  Vaping Use   Vaping Use: Never used  Substance and Sexual Activity   Alcohol use: Not Currently    Alcohol/week: 0.0 standard drinks   Drug use: No   Sexual activity: Not Currently    Partners: Male    Comment: offered condoms  Other Topics Concern   Not on file  Social History Narrative   Not on file   Social Determinants of Health   Financial Resource Strain: Not on file  Food Insecurity: Not on file  Transportation Needs: Not on file  Physical Activity: Not on file  Stress: Not on file  Social Connections: Not on file  Intimate Partner Violence: Not on file     Constitutional: Pt reports intermittent headaches. Denies fever, malaise, fatigue, or abrupt weight changes.  HEENT:  Denies eye pain, eye redness, ear pain, ringing in the ears, wax buildup, runny nose, nasal congestion, bloody nose, or sore throat. Respiratory: Denies difficulty breathing, shortness of breath, cough or sputum production.   Cardiovascular: Denies chest pain, chest tightness, palpitations or swelling in the hands or feet.  Gastrointestinal: Denies abdominal pain, bloating, constipation, diarrhea or blood in the stool.  GU: Denies urgency, frequency, pain  with urination, burning sensation, blood in urine, odor or discharge. Musculoskeletal: Pt reports left side weakness, right shoulder pain. Denies decrease in range of motion, difficulty with gait, muscle pain or joint pain and swelling.  Skin: Denies redness, rashes, lesions or ulcercations.  Neurological: Denies dizziness, difficulty with memory, difficulty with speech or problems with balance and coordination.  Psych: Pt has a history of depression. Denies anxiety, depression, SI/HI.  No other specific complaints in a complete review of systems (except as listed in HPI above).  Objective:   Physical Exam  BP 118/66 (BP Location: Left Arm, Patient Position: Sitting, Cuff Size: Normal)    Pulse 82    Temp (!) 97.5 F (36.4 C) (Temporal)    Resp 18    Ht 5' 2"  (1.575 m)    Wt 153 lb (69.4 kg)    SpO2 100%    BMI 27.98 kg/m   Wt Readings from Last 3 Encounters:  04/16/21 155 lb (70.3 kg)  04/10/21 157 lb (71.2 kg)  03/21/21 158 lb (71.7 kg)    General: Appears her stated age, overweight, in NAD. Skin: Warm, dry and intact. No ulcerations noted. HEENT: Head: normal shape and size; Eyes: sclera white and EOMs intact;  Cardiovascular: Normal rate and rhythm. S1,S2 noted.  No murmur, rubs or gallops noted. No JVD or BLE edema. No carotid bruits noted. Pulmonary/Chest: Normal effort and positive vesicular breath sounds. No respiratory distress. No wheezes, rales or ronchi noted.  Abdomen:  Normal bowel sounds.  Musculoskeletal: Normal  internal and external rotation of the right shoulder.  Pain with palpation of the right subacromial bursa and anterior proximal biceps tendon.  Strength 5/5 RUE, 4/5 LUE.  No difficulty with gait.  Neurological: Alert and oriented. Psychiatric: Mood and affect normal. Behavior is normal. Judgment and thought content normal.    BMET    Component Value Date/Time   NA 141 04/10/2021 0854   NA 138 06/23/2014 0503   K 4.1 04/10/2021 0854   K 4.2 06/23/2014 0503   CL 108 04/10/2021 0854   CL 104 06/23/2014 0503   CO2 25 04/10/2021 0854   CO2 27 06/23/2014 0503   GLUCOSE 83 04/10/2021 0854   GLUCOSE 246 (H) 06/23/2014 0503   BUN 20 04/10/2021 0854   BUN 17 06/23/2014 0503   CREATININE 1.06 (H) 04/10/2021 0854   CALCIUM 9.1 04/10/2021 0854   CALCIUM 8.7 06/23/2014 0503   GFRNONAA 57 (L) 11/26/2020 0956   GFRAA 66 11/26/2020 0956    Lipid Panel     Component Value Date/Time   CHOL 187 04/10/2021 0854   CHOL 211 (H) 06/22/2014 1432   TRIG 250 (H) 04/10/2021 0854   TRIG 473 (H) 06/22/2014 1432   HDL 48 (L) 04/10/2021 0854   HDL 42 06/22/2014 1432   CHOLHDL 3.9 04/10/2021 0854   VLDL 37.0 09/25/2020 1241   VLDL SEE COMMENT 06/22/2014 1432   LDLCALC 103 (H) 04/10/2021 0854   LDLCALC SEE COMMENT 06/22/2014 1432    CBC    Component Value Date/Time   WBC 5.7 11/26/2020 0956   RBC 4.05 11/26/2020 0956   HGB 12.4 11/26/2020 0956   HGB 12.2 06/23/2014 0503   HCT 38.1 11/26/2020 0956   HCT 37.2 06/23/2014 0503   PLT 250 11/26/2020 0956   PLT 251 06/23/2014 0503   MCV 94.1 11/26/2020 0956   MCV 89 06/23/2014 0503   MCH 30.6 11/26/2020 0956   MCHC 32.5 11/26/2020 0956   RDW 13.1 11/26/2020  0956   RDW 13.6 06/23/2014 0503   LYMPHSABS 1,984 11/26/2020 0956   LYMPHSABS 1.8 06/23/2014 0503   MONOABS 0.6 03/01/2019 1730   MONOABS 0.4 06/23/2014 0503   EOSABS 137 11/26/2020 0956   EOSABS 0.0 06/23/2014 0503   BASOSABS 29 11/26/2020 0956   BASOSABS 0.0 06/23/2014 0503    Hgb  A1C Lab Results  Component Value Date   HGBA1C 8.1 (A) 03/21/2021            Assessment & Plan:   Right Shoulder Pain:  Exam consistent with bursitis and tendinitis X-ray right shoulder today Consider round of anti-inflammatories versus steroids pending x-ray  We will follow-up after labs and imaging with further recommendation and treatment plan Webb Silversmith, NP This visit occurred during the SARS-CoV-2 public health emergency.  Safety protocols were in place, including screening questions prior to the visit, additional usage of staff PPE, and extensive cleaning of exam room while observing appropriate contact time as indicated for disinfecting solutions.

## 2021-07-11 LAB — MICROALBUMIN / CREATININE URINE RATIO
Creatinine, Urine: 102 mg/dL (ref 20–275)
Microalb Creat Ratio: 2 mcg/mg creat (ref <30)
Microalb, Ur: 0.2 mg/dL

## 2021-07-11 LAB — COMPLETE METABOLIC PANEL WITH GFR
AG Ratio: 1.6 (calc) (ref 1.0–2.5)
ALT: 13 U/L (ref 6–29)
AST: 14 U/L (ref 10–35)
Albumin: 4.1 g/dL (ref 3.6–5.1)
Alkaline phosphatase (APISO): 103 U/L (ref 37–153)
BUN: 16 mg/dL (ref 7–25)
CO2: 24 mmol/L (ref 20–32)
Calcium: 8.9 mg/dL (ref 8.6–10.4)
Chloride: 110 mmol/L (ref 98–110)
Creat: 1 mg/dL (ref 0.50–1.05)
Globulin: 2.5 g/dL (calc) (ref 1.9–3.7)
Glucose, Bld: 117 mg/dL — ABNORMAL HIGH (ref 65–99)
Potassium: 4.3 mmol/L (ref 3.5–5.3)
Sodium: 142 mmol/L (ref 135–146)
Total Bilirubin: 0.2 mg/dL (ref 0.2–1.2)
Total Protein: 6.6 g/dL (ref 6.1–8.1)
eGFR: 63 mL/min/{1.73_m2} (ref >=60)

## 2021-07-11 LAB — LIPID PANEL
Cholesterol: 148 mg/dL (ref <200)
HDL: 42 mg/dL — ABNORMAL LOW (ref >=50)
LDL Cholesterol (Calc): 72 mg/dL (calc)
Non-HDL Cholesterol (Calc): 106 mg/dL (calc) (ref <130)
Total CHOL/HDL Ratio: 3.5 (calc) (ref <5.0)
Triglycerides: 243 mg/dL — ABNORMAL HIGH (ref <150)

## 2021-07-11 LAB — CBC
HCT: 36.9 % (ref 35.0–45.0)
Hemoglobin: 11.9 g/dL (ref 11.7–15.5)
MCH: 29.8 pg (ref 27.0–33.0)
MCHC: 32.2 g/dL (ref 32.0–36.0)
MCV: 92.5 fL (ref 80.0–100.0)
MPV: 10.3 fL (ref 7.5–12.5)
Platelets: 214 10*3/uL (ref 140–400)
RBC: 3.99 10*6/uL (ref 3.80–5.10)
RDW: 13.9 % (ref 11.0–15.0)
WBC: 5.3 10*3/uL (ref 3.8–10.8)

## 2021-07-11 LAB — HEMOGLOBIN A1C
Hgb A1c MFr Bld: 6.7 % of total Hgb — ABNORMAL HIGH (ref <5.7)
Mean Plasma Glucose: 146 mg/dL
eAG (mmol/L): 8.1 mmol/L

## 2021-07-11 MED ORDER — EZETIMIBE 10 MG PO TABS: 10.0000 mg | ORAL_TABLET | Freq: Every day | ORAL | 1 refills | Status: DC

## 2021-07-12 ENCOUNTER — Other Ambulatory Visit: Payer: Self-pay | Admitting: Internal Medicine

## 2021-07-12 NOTE — Telephone Encounter (Signed)
Requested Prescriptions  Pending Prescriptions Disp Refills   LEVEMIR FLEXTOUCH 100 UNIT/ML FlexTouch Pen [Pharmacy Med Name: LEVEMIR FLEX TOUCH PEN INJ 3ML] 15 mL 0    Sig: INJECT 10 UNITS INTO THE SKIN ONCE DAILY     Endocrinology:  Diabetes - Insulins Passed - 07/12/2021  4:53 PM      Passed - HBA1C is between 0 and 7.9 and within 180 days    Hemoglobin A1C  Date Value Ref Range Status  06/22/2014 7.5 (H) 4.2 - 6.3 % Final    Comment:    The American Diabetes Association recommends that a primary goal of therapy should be <7% and that physicians should reevaluate the treatment regimen in patients with HbA1c values consistently >8%.    Hgb A1c MFr Bld  Date Value Ref Range Status  07/10/2021 6.7 (H) <5.7 % of total Hgb Final    Comment:    For someone without known diabetes, a hemoglobin A1c value of 6.5% or greater indicates that they may have  diabetes and this should be confirmed with a follow-up  test. . For someone with known diabetes, a value <7% indicates  that their diabetes is well controlled and a value  greater than or equal to 7% indicates suboptimal  control. A1c targets should be individualized based on  duration of diabetes, age, comorbid conditions, and  other considerations. . Currently, no consensus exists regarding use of hemoglobin A1c for diagnosis of diabetes for children. Renella Cunas - Valid encounter within last 6 months    Recent Outpatient Visits          2 days ago Type 2 diabetes mellitus with hyperglycemia, with long-term current use of insulin [facility])   [facility] [place], Coralie Keens, NP   2 months ago Medication side effect, initial encounter   [facility] [place], Coralie Keens, NP   3 months ago Needs flu shot   [facility] [place], [place] W, NP   3 months ago Diabetes mellitus type 2, uncontrolled, with complications [facility])   [facility], NP   7 months ago  Seasonal allergic rhinitis due to pollen   [facility], Coralie Keens, NP      Future Appointments            In 3 months Ba[place], Coralie Keens, NP [facility], [facility]

## 2021-07-24 ENCOUNTER — Other Ambulatory Visit: Payer: Self-pay

## 2021-07-24 ENCOUNTER — Other Ambulatory Visit: Payer: BC Managed Care – PPO

## 2021-07-24 DIAGNOSIS — Z21 Asymptomatic human immunodeficiency virus [HIV] infection status: Secondary | ICD-10-CM

## 2021-07-25 LAB — T-HELPER CELL (CD4) - (RCID CLINIC ONLY)
CD4 % Helper T Cell: 51 % (ref 33–65)
CD4 T Cell Abs: 885 /uL (ref 400–1790)

## 2021-07-27 LAB — HIV-1 RNA QUANT-NO REFLEX-BLD
HIV 1 RNA Quant: NOT DETECTED Copies/mL
HIV-1 RNA Quant, Log: NOT DETECTED Log cps/mL

## 2021-08-19 ENCOUNTER — Ambulatory Visit: Payer: Self-pay

## 2021-08-19 ENCOUNTER — Ambulatory Visit (INDEPENDENT_AMBULATORY_CARE_PROVIDER_SITE_OTHER): Payer: BC Managed Care – PPO | Admitting: Infectious Diseases

## 2021-08-19 ENCOUNTER — Encounter: Payer: Self-pay | Admitting: Infectious Diseases

## 2021-08-19 ENCOUNTER — Other Ambulatory Visit: Payer: Self-pay

## 2021-08-19 DIAGNOSIS — R1110 Vomiting, unspecified: Secondary | ICD-10-CM

## 2021-08-19 DIAGNOSIS — K529 Noninfective gastroenteritis and colitis, unspecified: Secondary | ICD-10-CM

## 2021-08-19 DIAGNOSIS — Z21 Asymptomatic human immunodeficiency virus [HIV] infection status: Secondary | ICD-10-CM

## 2021-08-19 MED ORDER — ONDANSETRON 4 MG PO TBDP: 4.0000 mg | ORAL_TABLET | Freq: Three times a day (TID) | ORAL | 0 refills | Status: DC | PRN

## 2021-08-19 NOTE — Telephone Encounter (Signed)
Patient called, left VM to return the call to the office to discuss symptoms with a nurse. Already seen by another provider today, Zofran prescribed.  Unable to reach patient after 3 attempts by [facility] NT, routing to the provider for resolution per protocol.  Summary: Nausea   Pt called and stated that she would like a call back from the nurse regarding vomiting. Pt is requesting medication sent in for nausea. Please advise

## 2021-08-19 NOTE — Progress Notes (Signed)
Patient: [name]  DOB: 06-24-1956 MRN: [id number] PCP: Jearld Fenton, NP    VIRTUAL CARE ENCOUNTER  I connected with [name] on 08/19/21 at 10:00 AM EST by TELEPHONE and verified that I am speaking with the correct person using two identifiers.   I discussed the limitations, risks, security and privacy concerns of performing an evaluation and management service by telephone and the availability of in person appointments. I also discussed with the patient that there may be a patient responsible charge related to this service. The patient expressed understanding and agreed to proceed.  Patient Location:   Other Participants:   Provider Location: [facility] Office    Subjective:  Brief ID:  [name] is a 66 y.o. female with HIV infection diagnosed August 2019 on routine screening after having had a stroke. VL 118,000 copies, CD4 500.  HIV Risk: heterosexual.  OI Hx: none  Previous Regimens:  Biktarvy 02-2018: suppressed   Resistance Testing:  02-2018 wildtype virus     Chief Complaint  Patient presents with   Follow-up      HPI/ROS:  Has be sick with vomiting and now diarrhea for the last 12 hours. Feels miserable and very achy. Abdominal pain with the vomiting.  Still taking her Biktarvy daily routinely.   She is otherwise eating and sleeping well.  No concerns with depressed or anxious mood.    Pap smear was in 2020.    Review of Systems  Constitutional:  Positive for fatigue. Negative for chills and fever.  Gastrointestinal:  Positive for abdominal pain, diarrhea, nausea and vomiting.    Past Medical History:  Diagnosis Date   Arthritis    fingers   Asthma    Diabetes mellitus without complication ([place]) 10-19-12   hx. NIDDM-dx. 3 weeks ago   Headache    migraines - none since stroke   HIV (human immunodeficiency virus infection) ([place])    Hyperlipidemia    hx   Seasonal allergies    Stroke ([place]) 02/18/2018   Residual Left  side weakness and speech issues   Ulcerative colitis ([place])    Wears contact lenses    Wears partial dentures    upper    Outpatient Medications Prior to Visit  Medication Sig Dispense Refill   amLODipine (NORVASC) 5 MG tablet Take 1 tablet (5 mg total) by mouth daily. 90 tablet 1   aspirin EC 81 MG EC tablet Take 1 tablet (81 mg total) by mouth daily.     BIKTARVY 50-200-25 MG TABS tablet TAKE ONE TABLET BY MOUTH DAILY 30 tablet 0   butalbital-acetaminophen-caffeine (FIORICET) 50-325-40 MG tablet Take 1-2 tablets by mouth every 6 (six) hours as needed for headache. 20 tablet 0   cetirizine (ZYRTEC) 10 MG tablet Take 10 mg by mouth daily.     ezetimibe (ZETIA) 10 MG tablet Take 1 tablet (10 mg total) by mouth daily. 90 tablet 1   fluticasone (FLONASE) 50 MCG/ACT nasal spray Place 1 spray into both nostrils daily as needed for allergies or rhinitis.     FREESTYLE LITE test strip TEST BLOOD SUGAR TWICE DAILY AS DIRECTED 100 strip 10   glipiZIDE (GLUCOTROL) 10 MG tablet TAKE 1 TABLET TWO TIMES DAILY BEFORE MEALS. MUST SCHEDULE PHYSICAL 180 tablet 3   glucose 4 GM chewable tablet Chew 1 tablet (4 g total) by mouth as needed for low blood sugar. 30 tablet 3   Insulin Pen Needle (B-D UF III MINI PEN NEEDLES) 31G X 5  MM MISC USE TO INJECT INSULIN SIX TIMES PER DAY 100 each 0   KLOR-CON M20 20 MEQ tablet TAKE 1 TABLET DAILY. MUST SCHEDULE PHYSICAL. 90 tablet 3   mirtazapine (REMERON) 15 MG tablet Take 2 tablets (30 mg total) by mouth at bedtime. 180 tablet 3   rosuvastatin (CRESTOR) 10 MG tablet TAKE 1 TABLET DAILY 90 tablet 2   sitaGLIPtin (JANUVIA) 100 MG tablet Take 1 tablet (100 mg total) by mouth daily. 90 tablet 2   topiramate (TOPAMAX) 100 MG tablet TAKE 1 TABLET AT BEDTIME AS NEEDED 90 tablet 3   triamcinolone cream (KENALOG) 0.1 % Apply 1 application topically 2 (two) times daily. 30 g 0   LEVEMIR FLEXTOUCH 100 UNIT/ML FlexTouch Pen INJECT 10 UNITS INTO THE SKIN ONCE DAILY (Patient not  taking: Reported on 08/19/2021) 15 mL 0   No facility-administered medications prior to visit.     Allergies  Allergen Reactions   Azithromycin Itching    Muscle aches   Erythromycin Nausea And Vomiting   Penicillins Nausea And Vomiting    Did it involve swelling of the face/tongue/throat, SOB, or low BP? No Did it involve sudden or severe rash/hives, skin peeling, or any reaction on the inside of your mouth or nose? No Did you need to seek medical attention at a hospital or doctor's office? No When did it last happen?      childhood allergy If all above answers are NO, may proceed with cephalosporin use.    Tetracycline Nausea And Vomiting    Social History   Tobacco Use   Smoking status: Never   Smokeless tobacco: Never  Vaping Use   Vaping Use: Never used  Substance Use Topics   Alcohol use: Not Currently    Alcohol/week: 0.0 standard drinks   Drug use: No    Objective:   There were no vitals filed for this visit.  There is no height or weight on file to calculate BMI.  Physical Exam Pulmonary:     Effort: Pulmonary effort is normal.     Comments: No shortness of breath detected in conversation.  Neurological:     Mental Status: She is oriented to person, place, and time.  Psychiatric:        Mood and Affect: Mood normal.        Behavior: Behavior normal.        Thought Content: Thought content normal.        Judgment: Judgment normal.    Lab Results: HIV 1 RNA Quant (Copies/mL)  Date Value  07/24/2021 Not Detected  11/26/2020 Not Detected  05/28/2020 <20   CD4 T Cell Abs (/uL)  Date Value  07/24/2021 885  11/26/2020 952  05/28/2020 876    Lab Results  Component Value Date   WBC 5.3 07/10/2021   HGB 11.9 07/10/2021   HCT 36.9 07/10/2021   MCV 92.5 07/10/2021   PLT 214 07/10/2021    Lab Results  Component Value Date   CREATININE 1.00 07/10/2021   BUN 16 07/10/2021   NA 142 07/10/2021   K 4.3 07/10/2021   CL 110 07/10/2021   CO2 24  07/10/2021    Lab Results  Component Value Date   ALT 13 07/10/2021   AST 14 07/10/2021   ALKPHOS 104 09/25/2020   BILITOT 0.2 07/10/2021     Assessment & Plan:   Problem List Items Addressed This Visit       Unprioritized   Asymptomatic HIV infection, with no history  of HIV-related illness [facility])    Doing well on Biktarvy once daily. Discussed recent labs today and recommended 38mfollow up.       Relevant Orders   HIV-1 RNA quant-no reflex-bld   CBC   Comprehensive metabolic panel   T-helper cells (CD4) count   Gastroenteritis    Likely viral gastroenteritis - has been around some younger kids and at church. Sudden symptoms that started early this morning with vomiting and now diarrhea.  We discussed symptomatic care, precautions for ER evaluation if she gets too dehydrated and diabetes medications/ cbg monitoring.  Will give zofran ODT and have her try imodium.       Other Visit Diagnoses     Vomiting, unspecified vomiting type, unspecified whether nausea present    -  Primary   Relevant Medications   ondansetron (ZOFRAN-ODT) 4 MG disintegrating tablet     No follow-ups on file.   Follow Up Instructions: As above   I discussed the assessment and treatment plan with the patient. The patient was provided an opportunity to ask questions and all were answered. The patient agreed with the plan and demonstrated an understanding of the instructions.   The patient was advised to call back or seek an in-person evaluation if the symptoms worsen or if the condition fails to improve as anticipated.  I provided 9 minutes of non-face-to-face time during this encounter.   SJanene Madeira MSN, NP-C REastern Ni[facility] Hospitalfor Infectious Disease [email or web address] Pager: [phone]: [phone] [phone]   08/19/21  11:05 AM

## 2021-08-19 NOTE — Patient Instructions (Addendum)
For the nausea start taking the medication ZOFRAN - 1 or 2 tabs under the tongue to dissolve. Can take this every 8 hours as needed for vomiting.   Make sure you can take in some food / rehydration fluids before you take your glipizide.  Start with bland options, non-dairy options, broths, thin soups, ice pops.   Imodium - take 2 at first then see how you do. Follow the box instructions thereafter to get you some relief.   Hopeful you will feel better in a few days as this passes through.   Schedule a visit back in 6 months for routine care and vaccine updates.

## 2021-08-19 NOTE — Telephone Encounter (Signed)
Patient called, left VM to return the call to the office to discuss symptoms with a nurse. Asked her to let us know if she is still needing assistance with this because the medication was sent in today during the [place] she had with another provider.   Summary: Nausea   Pt called and stated that she would like a call back from the nurse regarding vomiting. Pt is requesting medication sent in for nausea. Please advise

## 2021-08-19 NOTE — Assessment & Plan Note (Signed)
Likely viral gastroenteritis - has been around some younger kids and at church. Sudden symptoms that started early this morning with vomiting and now diarrhea.  We discussed symptomatic care, precautions for ER evaluation if she gets too dehydrated and diabetes medications/ cbg monitoring.  Will give zofran ODT and have her try imodium.

## 2021-08-19 NOTE — Assessment & Plan Note (Addendum)
Doing well on Biktarvy once daily. Discussed recent labs today and recommended 71mfollow up.

## 2021-08-19 NOTE — Telephone Encounter (Signed)
2nd attempt, pt called, left VM to call office back if still needing assistance since Zofran was sent to pharmacy today.

## 2021-08-20 ENCOUNTER — Ambulatory Visit: Payer: BC Managed Care – PPO | Admitting: Internal Medicine

## 2021-08-20 ENCOUNTER — Other Ambulatory Visit: Payer: Self-pay | Admitting: Infectious Diseases

## 2021-08-20 NOTE — Telephone Encounter (Signed)
FYI... Apt today at 3:40   Thanks,   -Mickel Baas

## 2021-08-20 NOTE — Progress Notes (Deleted)
Subjective:    Patient ID: [name], female    DOB: 1955/12/05, 66 y.o.   MRN: [id number]  HPI  Patient presents the clinic today with complaint of vomiting and diarrhea.  She reports this started yesterday.  She did have an E-visit yesterday with her ID doctor who sent in [place] for her after diagnosing her with viral gastroenteritis.  She also recommended that she take Imodium OTC.  Review of Systems     Past Medical History:  Diagnosis Date   Arthritis    fingers   Asthma    Diabetes mellitus without complication ([place]) 1-85-63   hx. NIDDM-dx. 3 weeks ago   Headache    migraines - none since stroke   HIV (human immunodeficiency virus infection) ([place])    Hyperlipidemia    hx   Seasonal allergies    Stroke ([place]) 02/18/2018   Residual Left side weakness and speech issues   Ulcerative colitis ([place])    Wears contact lenses    Wears partial dentures    upper    Current Outpatient Medications  Medication Sig Dispense Refill   amLODipine (NORVASC) 5 MG tablet Take 1 tablet (5 mg total) by mouth daily. 90 tablet 1   aspirin EC 81 MG EC tablet Take 1 tablet (81 mg total) by mouth daily.     BIKTARVY 50-200-25 MG TABS tablet TAKE ONE TABLET BY MOUTH DAILY 30 tablet 0   butalbital-acetaminophen-caffeine (FIORICET) 50-325-40 MG tablet Take 1-2 tablets by mouth every 6 (six) hours as needed for headache. 20 tablet 0   cetirizine (ZYRTEC) 10 MG tablet Take 10 mg by mouth daily.     ezetimibe (ZETIA) 10 MG tablet Take 1 tablet (10 mg total) by mouth daily. 90 tablet 1   fluticasone (FLONASE) 50 MCG/ACT nasal spray Place 1 spray into both nostrils daily as needed for allergies or rhinitis.     FREESTYLE LITE test strip TEST BLOOD SUGAR TWICE DAILY AS DIRECTED 100 strip 10   glipiZIDE (GLUCOTROL) 10 MG tablet TAKE 1 TABLET TWO TIMES DAILY BEFORE MEALS. MUST SCHEDULE PHYSICAL 180 tablet 3   glucose 4 GM chewable tablet Chew 1 tablet (4 g total) by mouth as needed for low blood  sugar. 30 tablet 3   Insulin Pen Needle (B-D UF III MINI PEN NEEDLES) 31G X 5 MM MISC USE TO INJECT INSULIN SIX TIMES PER DAY 100 each 0   KLOR-CON M20 20 MEQ tablet TAKE 1 TABLET DAILY. MUST SCHEDULE PHYSICAL. 90 tablet 3   LEVEMIR FLEXTOUCH 100 UNIT/ML FlexTouch Pen INJECT 10 UNITS INTO THE SKIN ONCE DAILY (Patient not taking: Reported on 08/19/2021) 15 mL 0   mirtazapine (REMERON) 15 MG tablet Take 2 tablets (30 mg total) by mouth at bedtime. 180 tablet 3   ondansetron (ZOFRAN-ODT) 4 MG disintegrating tablet Take 1-2 tablets (4-8 mg total) by mouth every 8 (eight) hours as needed for nausea or vomiting. 30 tablet 0   rosuvastatin (CRESTOR) 10 MG tablet TAKE 1 TABLET DAILY 90 tablet 2   sitaGLIPtin (JANUVIA) 100 MG tablet Take 1 tablet (100 mg total) by mouth daily. 90 tablet 2   topiramate (TOPAMAX) 100 MG tablet TAKE 1 TABLET AT BEDTIME AS NEEDED 90 tablet 3   triamcinolone cream (KENALOG) 0.1 % Apply 1 application topically 2 (two) times daily. 30 g 0   No current facility-administered medications for this visit.    Allergies  Allergen Reactions   Azithromycin Itching    Muscle aches  Erythromycin Nausea And Vomiting   Penicillins Nausea And Vomiting    Did it involve swelling of the face/tongue/throat, SOB, or low BP? No Did it involve sudden or severe rash/hives, skin peeling, or any reaction on the inside of your mouth or nose? No Did you need to seek medical attention at a hospital or doctor's office? No When did it last happen?      childhood allergy If all above answers are NO, may proceed with cephalosporin use.    Tetracycline Nausea And Vomiting    Family History  Problem Relation Age of Onset   Hypertension Mother    Diabetes Mother    Cancer Father        lung ca   Seizures Daughter        27yr    Social History   Socioeconomic History   Marital status: Legally Separated    Spouse name: Not on file   Number of children: Not on file   Years of education:  Not on file   Highest education level: Not on file  Occupational History    Comment: disabled  Tobacco Use   Smoking status: Never   Smokeless tobacco: Never  Vaping Use   Vaping Use: Never used  Substance and Sexual Activity   Alcohol use: Not Currently    Alcohol/week: 0.0 standard drinks   Drug use: No   Sexual activity: Not Currently    Partners: Male    Comment: offered condoms  Other Topics Concern   Not on file  Social History Narrative   Not on file   Social Determinants of Health   Financial Resource Strain: Not on file  Food Insecurity: Not on file  Transportation Needs: Not on file  Physical Activity: Not on file  Stress: Not on file  Social Connections: Not on file  Intimate Partner Violence: Not on file     Constitutional: Denies fever, malaise, fatigue, headache or abrupt weight changes.  HEENT: Denies eye pain, eye redness, ear pain, ringing in the ears, wax buildup, runny nose, nasal congestion, bloody nose, or sore throat. Respiratory: Denies difficulty breathing, shortness of breath, cough or sputum production.   Cardiovascular: Denies chest pain, chest tightness, palpitations or swelling in the hands or feet.  Gastrointestinal: Patient reports vomiting and diarrhea.  Denies abdominal pain, bloating, constipation, or blood in the stool.  GU: Denies urgency, frequency, pain with urination, burning sensation, blood in urine, odor or discharge. Musculoskeletal: Denies decrease in range of motion, difficulty with gait, muscle pain or joint pain and swelling.  Skin: Denies redness, rashes, lesions or ulcercations.  Neurological: Denies dizziness, difficulty with memory, difficulty with speech or problems with balance and coordination.  Psych: Denies anxiety, depression, SI/HI.  No other specific complaints in a complete review of systems (except as listed in HPI above).  Objective:   Physical Exam  There were no vitals taken for this visit. Wt Readings  from Last 3 Encounters:  07/10/21 153 lb (69.4 kg)  04/16/21 155 lb (70.3 kg)  04/10/21 157 lb (71.2 kg)    General: Appears their stated age, well developed, well nourished in NAD. Skin: Warm, dry and intact. No rashes, lesions or ulcerations noted. HEENT: Head: normal shape and size; Eyes: sclera white, no icterus, conjunctiva pink, PERRLA and EOMs intact; Ears: Tm's gray and intact, normal light reflex; Nose: mucosa pink and moist, septum midline; Throat/Mouth: Teeth present, mucosa pink and moist, no exudate, lesions or ulcerations noted.  Neck:  Neck supple,  trachea midline. No masses, lumps or thyromegaly present.  Cardiovascular: Normal rate and rhythm. S1,S2 noted.  No murmur, rubs or gallops noted. No JVD or BLE edema. No carotid bruits noted. Pulmonary/Chest: Normal effort and positive vesicular breath sounds. No respiratory distress. No wheezes, rales or ronchi noted.  Abdomen: Soft and nontender. Normal bowel sounds. No distention or masses noted. Liver, spleen and kidneys non palpable. Musculoskeletal: Normal range of motion. No signs of joint swelling. No difficulty with gait.  Neurological: Alert and oriented. Cranial nerves II-XII grossly intact. Coordination normal.  Psychiatric: Mood and affect normal. Behavior is normal. Judgment and thought content normal.    BMET    Component Value Date/Time   NA 142 07/10/2021 0907   NA 138 06/23/2014 0503   K 4.3 07/10/2021 0907   K 4.2 06/23/2014 0503   CL 110 07/10/2021 0907   CL 104 06/23/2014 0503   CO2 24 07/10/2021 0907   CO2 27 06/23/2014 0503   GLUCOSE 117 (H) 07/10/2021 0907   GLUCOSE 246 (H) 06/23/2014 0503   BUN 16 07/10/2021 0907   BUN 17 06/23/2014 0503   CREATININE 1.00 07/10/2021 0907   CALCIUM 8.9 07/10/2021 0907   CALCIUM 8.7 06/23/2014 0503   GFRNONAA 57 (L) 11/26/2020 0956   GFRAA 66 11/26/2020 0956    Lipid Panel     Component Value Date/Time   CHOL 148 07/10/2021 0907   CHOL 211 (H) 06/22/2014  1432   TRIG 243 (H) 07/10/2021 0907   TRIG 473 (H) 06/22/2014 1432   HDL 42 (L) 07/10/2021 0907   HDL 42 06/22/2014 1432   CHOLHDL 3.5 07/10/2021 0907   VLDL 37.0 09/25/2020 1241   VLDL SEE COMMENT 06/22/2014 1432   LDLCALC 72 07/10/2021 0907   LDLCALC SEE COMMENT 06/22/2014 1432    CBC    Component Value Date/Time   WBC 5.3 07/10/2021 0907   RBC 3.99 07/10/2021 0907   HGB 11.9 07/10/2021 0907   HGB 12.2 06/23/2014 0503   HCT 36.9 07/10/2021 0907   HCT 37.2 06/23/2014 0503   PLT 214 07/10/2021 0907   PLT 251 06/23/2014 0503   MCV 92.5 07/10/2021 0907   MCV 89 06/23/2014 0503   MCH 29.8 07/10/2021 0907   MCHC 32.2 07/10/2021 0907   RDW 13.9 07/10/2021 0907   RDW 13.6 06/23/2014 0503   LYMPHSABS 1,984 11/26/2020 0956   LYMPHSABS 1.8 06/23/2014 0503   MONOABS 0.6 03/01/2019 1730   MONOABS 0.4 06/23/2014 0503   EOSABS 137 11/26/2020 0956   EOSABS 0.0 06/23/2014 0503   BASOSABS 29 11/26/2020 0956   BASOSABS 0.0 06/23/2014 0503    Hgb A1C Lab Results  Component Value Date   HGBA1C 6.7 (H) 07/10/2021            Assessment & Plan:   Continue Zofran Encourage bland diet Advised her to push fluids Avoid antidiarrheals OTC  Return precautions discussed Webb Silversmith, NP This visit occurred during the SARS-CoV-2 public health emergency.  Safety protocols were in place, including screening questions prior to the visit, additional usage of staff PPE, and extensive cleaning of exam room while observing appropriate contact time as indicated for disinfecting solutions.

## 2021-08-20 NOTE — Telephone Encounter (Signed)
Noted  

## 2021-08-21 ENCOUNTER — Other Ambulatory Visit: Payer: Self-pay | Admitting: Internal Medicine

## 2021-08-21 NOTE — Telephone Encounter (Signed)
Requested medication (s) are due for refill today: unclear, pt stated this week that she is not taking this.  Requested medication (s) are on the active medication list: yes  Last refill:  07/12/21 75m with 0 RF  Future visit scheduled: 10/10/21  Notes to clinic:  Unclear if pt  taking this med. Please assess.      Requested Prescriptions  Pending Prescriptions Disp Refills   LEVEMIR FLEXTOUCH 100 UNIT/ML FlexTouch Pen [Pharmacy Med Name: LEVEMIR FLEX TOUCH PEN INJ 3ML] 15 mL 0    Sig: INJECT 10 UNITS INTO THE SKIN ONCE DAILY     Endocrinology:  Diabetes - Insulins Passed - 08/21/2021  3:02 PM      Passed - HBA1C is between 0 and 7.9 and within 180 days    Hemoglobin A1C  Date Value Ref Range Status  06/22/2014 7.5 (H) 4.2 - 6.3 % Final    Comment:    The American Diabetes Association recommends that a primary goal of therapy should be <7% and that physicians should reevaluate the treatment regimen in patients with HbA1c values consistently >8%.    Hgb A1c MFr Bld  Date Value Ref Range Status  07/10/2021 6.7 (H) <5.7 % of total Hgb Final    Comment:    For someone without known diabetes, a hemoglobin A1c value of 6.5% or greater indicates that they may have  diabetes and this should be confirmed with a follow-up  test. . For someone with known diabetes, a value <7% indicates  that their diabetes is well controlled and a value  greater than or equal to 7% indicates suboptimal  control. A1c targets should be individualized based on  duration of diabetes, age, comorbid conditions, and  other considerations. . Currently, no consensus exists regarding use of hemoglobin A1c for diagnosis of diabetes for children. .Renella Cunas- Valid encounter within last 6 months    Recent Outpatient Visits           1 month ago Type 2 diabetes mellitus with hyperglycemia, with long-term current use of insulin ([facility]   [facility]   4 months  ago Medication side effect, initial encounter   [place]   4 months ago Needs flu shot   [facility]   5 months ago Diabetes mellitus type 2, uncontrolled, with complications ([facility]   [facility]   9 months ago Seasonal allergic rhinitis due to pollen   [facility]       Future Appointments             In 1 month Baity, RCoralie Keens NP [facility]

## 2021-09-10 ENCOUNTER — Other Ambulatory Visit: Payer: Self-pay | Admitting: Internal Medicine

## 2021-09-10 NOTE — Telephone Encounter (Signed)
Requested Prescriptions  Pending Prescriptions Disp Refills   amLODipine (NORVASC) 5 MG tablet [Pharmacy Med Name: AMLODIPINE BESYLATE 5MG TABLETS] 90 tablet 1    Sig: TAKE 1 TABLET(5 MG) BY MOUTH DAILY     Cardiovascular: Calcium Channel Blockers 2 Passed - 09/10/2021  6:20 AM      Passed - Last BP in normal range    BP Readings from Last 1 Encounters:  07/10/21 118/66         Passed - Last Heart Rate in normal range    Pulse Readings from Last 1 Encounters:  07/10/21 82         Passed - Valid encounter within last 6 months    Recent Outpatient Visits          2 months ago Type 2 diabetes mellitus with hyperglycemia, with long-term current use of insulin ([place])   [facility] [place], Coralie Keens, NP   4 months ago Medication side effect, initial encounter   [facility] [place], Coralie Keens, NP   5 months ago Needs flu shot   [facility] [place], [place] W, NP   5 months ago Diabetes mellitus type 2, uncontrolled, with complications [facility])   [facility] [place], Coralie Keens, NP   9 months ago Seasonal allergic rhinitis due to pollen   [facility] [place], Coralie Keens, NP      Future Appointments            In 1 month Baity, Coralie Keens, NP [facility], [facility]

## 2021-10-10 ENCOUNTER — Encounter: Payer: BC Managed Care – PPO | Admitting: Internal Medicine

## 2021-10-23 ENCOUNTER — Other Ambulatory Visit: Payer: Self-pay | Admitting: Infectious Diseases

## 2021-10-23 ENCOUNTER — Encounter: Payer: Self-pay | Admitting: Internal Medicine

## 2021-10-23 ENCOUNTER — Telehealth: Payer: Self-pay

## 2021-10-23 ENCOUNTER — Ambulatory Visit (INDEPENDENT_AMBULATORY_CARE_PROVIDER_SITE_OTHER): Payer: BC Managed Care – PPO | Admitting: Internal Medicine

## 2021-10-23 VITALS — BP 116/68 | HR 75 | Temp 96.9°F | Ht 62.0 in | Wt 145.0 lb

## 2021-10-23 DIAGNOSIS — E663 Overweight: Secondary | ICD-10-CM

## 2021-10-23 DIAGNOSIS — Z1231 Encounter for screening mammogram for malignant neoplasm of breast: Secondary | ICD-10-CM

## 2021-10-23 DIAGNOSIS — Z6826 Body mass index (BMI) 26.0-26.9, adult: Secondary | ICD-10-CM | POA: Diagnosis not present

## 2021-10-23 DIAGNOSIS — E1165 Type 2 diabetes mellitus with hyperglycemia: Secondary | ICD-10-CM | POA: Diagnosis not present

## 2021-10-23 DIAGNOSIS — Z0001 Encounter for general adult medical examination with abnormal findings: Secondary | ICD-10-CM

## 2021-10-23 NOTE — Progress Notes (Signed)
? ?Subjective:  ? ? Patient ID: [name], female    DOB: July 10, 1956, 66 y.o.   MRN: [id number] ? ?HPI ? ?Patient presents to clinic today for her annual exam. ? ?Flu: 03/2021 ?Tetanus: 02/2019 ?COVID: Moderna x3 ?Pneumovax: 02/2018 ?Prevnar: 04/2019 ?Shingrix: Never ?Pap smear: 02/2019 ?Mammogram: 07/2019 ?Bone density: 07/2019 ?Colon screening: 11/2012 ?Vision screening: annually ?Dentist: biannually ? ?Diet: She does eat meat. She consumes fruits and veggies. She does eat fried foods. She drinks mostly water and soda. ?Exercise: None ? ?Review of Systems ? ?   ?Past Medical History:  ?Diagnosis Date  ? Arthritis   ? fingers  ? Asthma   ? Diabetes mellitus without complication ([place]) 08-01-12  ? hx. NIDDM-dx. 3 weeks ago  ? Headache   ? migraines - none since stroke  ? HIV (human immunodeficiency virus infection) ([place])   ? Hyperlipidemia   ? hx  ? Seasonal allergies   ? Stroke ([place]) 02/18/2018  ? Residual Left side weakness and speech issues  ? Ulcerative colitis ([place])   ? Wears contact lenses   ? Wears partial dentures   ? upper  ? ? ?Current Outpatient Medications  ?Medication Sig Dispense Refill  ? amLODipine (NORVASC) 5 MG tablet TAKE 1 TABLET(5 MG) BY MOUTH DAILY 90 tablet 1  ? aspirin EC 81 MG EC tablet Take 1 tablet (81 mg total) by mouth daily.    ? BIKTARVY 50-200-25 MG TABS tablet TAKE ONE TABLET BY MOUTH DAILY 30 tablet 2  ? cetirizine (ZYRTEC) 10 MG tablet Take 10 mg by mouth daily.    ? ezetimibe (ZETIA) 10 MG tablet Take 1 tablet (10 mg total) by mouth daily. 90 tablet 1  ? fluticasone (FLONASE) 50 MCG/ACT nasal spray Place 1 spray into both nostrils daily as needed for allergies or rhinitis.    ? FREESTYLE LITE test strip TEST BLOOD SUGAR TWICE DAILY AS DIRECTED 100 strip 10  ? glipiZIDE (GLUCOTROL) 10 MG tablet TAKE 1 TABLET TWO TIMES DAILY BEFORE MEALS. MUST SCHEDULE PHYSICAL 180 tablet 3  ? glucose 4 GM chewable tablet Chew 1 tablet (4 g total) by mouth as needed for low blood sugar. 30 tablet 3  ?  Insulin Pen Needle (B-D UF III MINI PEN NEEDLES) 31G X 5 MM MISC USE TO INJECT INSULIN SIX TIMES PER DAY 100 each 0  ? KLOR-CON M20 20 MEQ tablet TAKE 1 TABLET DAILY. MUST SCHEDULE PHYSICAL. 90 tablet 3  ? LEVEMIR FLEXTOUCH 100 UNIT/ML FlexTouch Pen INJECT 10 UNITS INTO THE SKIN ONCE DAILY 15 mL 0  ? mirtazapine (REMERON) 15 MG tablet Take 2 tablets (30 mg total) by mouth at bedtime. 180 tablet 3  ? ondansetron (ZOFRAN-ODT) 4 MG disintegrating tablet Take 1-2 tablets (4-8 mg total) by mouth every 8 (eight) hours as needed for nausea or vomiting. 30 tablet 0  ? rosuvastatin (CRESTOR) 10 MG tablet TAKE 1 TABLET DAILY 90 tablet 2  ? sitaGLIPtin (JANUVIA) 100 MG tablet Take 1 tablet (100 mg total) by mouth daily. 90 tablet 2  ? topiramate (TOPAMAX) 100 MG tablet TAKE 1 TABLET AT BEDTIME AS NEEDED 90 tablet 3  ? triamcinolone cream (KENALOG) 0.1 % Apply 1 application topically 2 (two) times daily. 30 g 0  ? ?No current facility-administered medications for this visit.  ? ? ?Allergies  ?Allergen Reactions  ? Azithromycin Itching  ?  Muscle aches  ? Erythromycin Nausea And Vomiting  ? Penicillins Nausea And Vomiting  ?  Did it involve swelling  of the face/tongue/throat, SOB, or low BP? No ?Did it involve sudden or severe rash/hives, skin peeling, or any reaction on the inside of your mouth or nose? No ?Did you need to seek medical attention at a hospital or doctor's office? No ?When did it last happen?      childhood allergy ?If all above answers are ?NO?, may proceed with cephalosporin use. ?  ? Tetracycline Nausea And Vomiting  ? ? ?Family History  ?Problem Relation Age of Onset  ? Hypertension Mother   ? Diabetes Mother   ? Cancer Father   ?     lung ca  ? Seizures Daughter   ?     75yr  ? ? ?Social History  ? ?Socioeconomic History  ? Marital status: Legally Separated  ?  Spouse name: Not on file  ? Number of children: Not on file  ? Years of education: Not on file  ? Highest education level: Not on file  ?Occupational  History  ?  Comment: disabled  ?Tobacco Use  ? Smoking status: Never  ? Smokeless tobacco: Never  ?Vaping Use  ? Vaping Use: Never used  ?Substance and Sexual Activity  ? Alcohol use: Not Currently  ?  Alcohol/week: 0.0 standard drinks  ? Drug use: No  ? Sexual activity: Not Currently  ?  Partners: Male  ?  Comment: offered condoms  ?Other Topics Concern  ? Not on file  ?Social History Narrative  ? Not on file  ? ?Social Determinants of Health  ? ?Financial Resource Strain: Not on file  ?Food Insecurity: Not on file  ?Transportation Needs: Not on file  ?Physical Activity: Not on file  ?Stress: Not on file  ?Social Connections: Not on file  ?Intimate Partner Violence: Not on file  ? ? ? ?Constitutional: Patient reports intermittent headaches.  Denies fever, malaise, fatigue, or abrupt weight changes.  ?HEENT: Denies eye pain, eye redness, ear pain, ringing in the ears, wax buildup, runny nose, nasal congestion, bloody nose, or sore throat. ?Respiratory: Denies difficulty breathing, shortness of breath, cough or sputum production.   ?Cardiovascular: Denies chest pain, chest tightness, palpitations or swelling in the hands or feet.  ?Gastrointestinal: Denies abdominal pain, bloating, constipation, diarrhea or blood in the stool.  ?GU: Denies urgency, frequency, pain with urination, burning sensation, blood in urine, odor or discharge. ?Musculoskeletal: Patient reports persistent left-sided weakness.  Denies decrease in range of motion, difficulty with gait, muscle pain or joint pain and swelling.  ?Skin: Denies redness, rashes, lesions or ulcercations.  ?Neurological: Denies dizziness, difficulty with memory, difficulty with speech or problems with balance and coordination.  ?Psych: Patient has a history of depression.  Denies anxiety, SI/HI. ? ?No other specific complaints in a complete review of systems (except as listed in HPI above). ? ?Objective:  ? Physical Exam ? ?BP 116/68 (BP Location: Left Arm, Patient  Position: Sitting, Cuff Size: Large)   Pulse 75   Temp (!) 96.9 ?F (36.1 ?C) (Temporal)   Ht 5' 2"  (1.575 m)   Wt 145 lb (65.8 kg)   BMI 26.52 kg/m?  ? ?Wt Readings from Last 3 Encounters:  ?07/10/21 153 lb (69.4 kg)  ?04/16/21 155 lb (70.3 kg)  ?04/10/21 157 lb (71.2 kg)  ? ? ?General: Appears her stated age, overweight, in NAD. ?Skin: Warm, dry and intact. No ulcerations noted. ?HEENT: Head: normal shape and size; Eyes: sclera white, no icterus, conjunctiva pink, PERRLA and EOMs intact;  ?Neck:  Neck supple, trachea midline.  No masses, lumps or thyromegaly present.  ?Cardiovascular: Normal rate and rhythm. S1,S2 noted.  No murmur, rubs or gallops noted. No JVD or BLE edema. No carotid bruits noted. ?Pulmonary/Chest: Normal effort and positive vesicular breath sounds. No respiratory distress. No wheezes, rales or ronchi noted.  ?Abdomen: Soft and nontender. Normal bowel sounds.  ?Musculoskeletal: Strength 4/5 LUE.  Strength 5/5 RUE.  Strength 5/5 BLE.  No difficulty with gait.  ?Neurological: Alert and oriented. Cranial nerves II-XII grossly intact. Coordination normal.  ?Psychiatric: Mood and affect normal. Behavior is normal. Judgment and thought content normal.  ? ? ?BMET ?   ?Component Value Date/Time  ? NA 142 07/10/2021 0907  ? NA 138 06/23/2014 0503  ? K 4.3 07/10/2021 0907  ? K 4.2 06/23/2014 0503  ? CL 110 07/10/2021 0907  ? CL 104 06/23/2014 0503  ? CO2 24 07/10/2021 0907  ? CO2 27 06/23/2014 0503  ? GLUCOSE 117 (H) 07/10/2021 3419  ? GLUCOSE 246 (H) 06/23/2014 0503  ? BUN 16 07/10/2021 0907  ? BUN 17 06/23/2014 0503  ? CREATININE 1.00 07/10/2021 0907  ? CALCIUM 8.9 07/10/2021 0907  ? CALCIUM 8.7 06/23/2014 0503  ? GFRNONAA 57 (L) 11/26/2020 0956  ? GFRAA 66 11/26/2020 0956  ? ? ?Lipid Panel  ?   ?Component Value Date/Time  ? CHOL 148 07/10/2021 0907  ? CHOL 211 (H) 06/22/2014 1432  ? TRIG 243 (H) 07/10/2021 6222  ? TRIG 473 (H) 06/22/2014 1432  ? HDL 42 (L) 07/10/2021 9798  ? HDL 42 06/22/2014 1432   ? CHOLHDL 3.5 07/10/2021 0907  ? VLDL 37.0 09/25/2020 1241  ? VLDL SEE COMMENT 06/22/2014 1432  ? LDLCALC 72 07/10/2021 0907  ? [place] SEE COMMENT 06/22/2014 1432  ? ? ?CBC ?   ?Component Value Date/Time  ?

## 2021-10-23 NOTE — Chronic Care Management (AMB) (Signed)
?  Care Management  ? ?Note ? ?10/23/2021 ?Name: [name] MRN: [id number] DOB: 12-20-1955 ? ?[name] is a 66 y.o. year old female who is a primary care patient of Jearld Fenton, NP. I reached out to [name] by phone today offer care coordination services.  ? ?[name] was given information about care management services today including:  ?Care management services include personalized support from designated clinical staff supervised by her physician, including individualized plan of care and coordination with other care providers ?24/7 contact phone numbers for assistance for urgent and routine care needs. ?The patient may stop care management services at any time by phone call to the office staff. ? ?Patient agreed to services and verbal consent obtained.  ? ?Follow up plan: ?Telephone appointment with care management team member scheduled for:10/31/2021 ? ?Noreene Larsson, RMA ?Care Guide, Embedded Care Coordination ?[facility]  Care Management  ?[place], [place] [address] ?Direct Dial: [phone] ?[email or web address] ?[email or web address]: [email or web address]  ? ?

## 2021-10-23 NOTE — Chronic Care Management (AMB) (Signed)
?  Care Management  ? ?Outreach Note ? ?10/23/2021 ?Name: [name] MRN: [id number] DOB: Oct 07, 1955 ? ?Referred by: Jearld Fenton, NP ?Reason for referral : Care Coordination (Outreach to schedule referral withRNCM ) ? ? ?An unsuccessful telephone outreach was attempted today. The patient was referred to the case management team for assistance with care management and care coordination.  ? ?Follow Up Plan:  ?A HIPAA compliant phone message was left for the patient providing contact information and requesting a return call.  ?The care management team will reach out to the patient again over the next 7 days.  ?If patient returns call to provider office, please advise to call [place] * at [phone]* ? ?Noreene Larsson, RMA ?Care Guide, Embedded Care Coordination ?[facility]  Care Management  ?[place], [place] [address] ?Direct Dial: [phone] ?[email or web address] ?[email or web address]: [email or web address]  ? ?

## 2021-10-23 NOTE — Patient Instructions (Signed)
Health Maintenance for Postmenopausal Women ?Menopause is a normal process in which your ability to get pregnant comes to an end. This process happens slowly over many months or years, usually between the ages of 27 and 33. Menopause is complete when you have missed your menstrual period for 12 months. ?It is important to talk with your health care provider about some of the most common conditions that affect women after menopause (postmenopausal women). These include heart disease, cancer, and bone loss (osteoporosis). Adopting a healthy lifestyle and getting preventive care can help to promote your health and wellness. The actions you take can also lower your chances of developing some of these common conditions. ?What are the signs and symptoms of menopause? ?During menopause, you may have the following symptoms: ?Hot flashes. These can be moderate or severe. ?Night sweats. ?Decrease in sex drive. ?Mood swings. ?Headaches. ?Tiredness (fatigue). ?Irritability. ?Memory problems. ?Problems falling asleep or staying asleep. ?Talk with your health care provider about treatment options for your symptoms. ?Do I need hormone replacement therapy? ?Hormone replacement therapy is effective in treating symptoms that are caused by menopause, such as hot flashes and night sweats. ?Hormone replacement carries certain risks, especially as you become older. If you are thinking about using estrogen or estrogen with progestin, discuss the benefits and risks with your health care provider. ?How can I reduce my risk for heart disease and stroke? ?The risk of heart disease, heart attack, and stroke increases as you age. One of the causes may be a change in the body's hormones during menopause. This can affect how your body uses dietary fats, triglycerides, and cholesterol. Heart attack and stroke are medical emergencies. There are many things that you can do to help prevent heart disease and stroke. ?Watch your blood pressure ?High  blood pressure causes heart disease and increases the risk of stroke. This is more likely to develop in people who have high blood pressure readings or are overweight. ?Have your blood pressure checked: ?Every 3-5 years if you are 53-60 years of age. ?Every year if you are 92 years old or older. ?Eat a healthy diet ? ?Eat a diet that includes plenty of vegetables, fruits, low-fat dairy products, and lean protein. ?Do not eat a lot of foods that are high in solid fats, added sugars, or sodium. ?Get regular exercise ?Get regular exercise. This is one of the most important things you can do for your health. Most adults should: ?Try to exercise for at least 150 minutes each week. The exercise should increase your heart rate and make you sweat (moderate-intensity exercise). ?Try to do strengthening exercises at least twice each week. Do these in addition to the moderate-intensity exercise. ?Spend less time sitting. Even light physical activity can be beneficial. ?Other tips ?Work with your health care provider to achieve or maintain a healthy weight. ?Do not use any products that contain nicotine or tobacco. These products include cigarettes, chewing tobacco, and vaping devices, such as e-cigarettes. If you need help quitting, ask your health care provider. ?Know your numbers. Ask your health care provider to check your cholesterol and your blood sugar (glucose). Continue to have your blood tested as directed by your health care provider. ?Do I need screening for cancer? ?Depending on your health history and family history, you may need to have cancer screenings at different stages of your life. This may include screening for: ?Breast cancer. ?Cervical cancer. ?Lung cancer. ?Colorectal cancer. ?What is my risk for osteoporosis? ?After menopause, you may be  at increased risk for osteoporosis. Osteoporosis is a condition in which bone destruction happens more quickly than new bone creation. To help prevent osteoporosis or  the bone fractures that can happen because of osteoporosis, you may take the following actions: ?If you are 59-30 years old, get at least 1,000 mg of calcium and at least 600 international units (IU) of vitamin D per day. ?If you are older than age 39 but younger than age 47, get at least 1,200 mg of calcium and at least 600 international units (IU) of vitamin D per day. ?If you are older than age 28, get at least 1,200 mg of calcium and at least 800 international units (IU) of vitamin D per day. ?Smoking and drinking excessive alcohol increase the risk of osteoporosis. Eat foods that are rich in calcium and vitamin D, and do weight-bearing exercises several times each week as directed by your health care provider. ?How does menopause affect my mental health? ?Depression may occur at any age, but it is more common as you become older. Common symptoms of depression include: ?Feeling depressed. ?Changes in sleep patterns. ?Changes in appetite or eating patterns. ?Feeling an overall lack of motivation or enjoyment of activities that you previously enjoyed. ?Frequent crying spells. ?Talk with your health care provider if you think that you are experiencing any of these symptoms. ?General instructions ?See your health care provider for regular wellness exams and vaccines. This may include: ?Scheduling regular health, dental, and eye exams. ?Getting and maintaining your vaccines. These include: ?Influenza vaccine. Get this vaccine each year before the flu season begins. ?Pneumonia vaccine. ?Shingles vaccine. ?Tetanus, diphtheria, and pertussis (Tdap) booster vaccine. ?Your health care provider may also recommend other immunizations. ?Tell your health care provider if you have ever been abused or do not feel safe at home. ?Summary ?Menopause is a normal process in which your ability to get pregnant comes to an end. ?This condition causes hot flashes, night sweats, decreased interest in sex, mood swings, headaches, or lack  of sleep. ?Treatment for this condition may include hormone replacement therapy. ?Take actions to keep yourself healthy, including exercising regularly, eating a healthy diet, watching your weight, and checking your blood pressure and blood sugar levels. ?Get screened for cancer and depression. Make sure that you are up to date with all your vaccines. ?This information is not intended to replace advice given to you by your health care provider. Make sure you discuss any questions you have with your health care provider. ?Document Revised: 11/19/2020 Document Reviewed: 11/19/2020 ?Elsevier Patient Education ? [employer]. ? ?

## 2021-10-23 NOTE — Assessment & Plan Note (Signed)
Encouraged diet and exercise for weight loss ?

## 2021-10-24 ENCOUNTER — Encounter: Payer: Self-pay | Admitting: Internal Medicine

## 2021-10-24 LAB — CBC
HCT: 38.3 % (ref 35.0–45.0)
Hemoglobin: 12.5 g/dL (ref 11.7–15.5)
MCH: 30.4 pg (ref 27.0–33.0)
MCHC: 32.6 g/dL (ref 32.0–36.0)
MCV: 93.2 fL (ref 80.0–100.0)
MPV: 9.9 fL (ref 7.5–12.5)
Platelets: 213 10*3/uL (ref 140–400)
RBC: 4.11 10*6/uL (ref 3.80–5.10)
RDW: 13 % (ref 11.0–15.0)
WBC: 5.5 10*3/uL (ref 3.8–10.8)

## 2021-10-24 LAB — COMPLETE METABOLIC PANEL WITHOUT GFR
AG Ratio: 1.4 (calc) (ref 1.0–2.5)
ALT: 23 U/L (ref 6–29)
AST: 21 U/L (ref 10–35)
Albumin: 4.2 g/dL (ref 3.6–5.1)
Alkaline phosphatase (APISO): 125 U/L (ref 37–153)
BUN/Creatinine Ratio: 12 (calc) (ref 6–22)
BUN: 13 mg/dL (ref 7–25)
CO2: 23 mmol/L (ref 20–32)
Calcium: 9.2 mg/dL (ref 8.6–10.4)
Chloride: 109 mmol/L (ref 98–110)
Creat: 1.1 mg/dL — ABNORMAL HIGH (ref 0.50–1.05)
Globulin: 2.9 g/dL (ref 1.9–3.7)
Glucose, Bld: 132 mg/dL — ABNORMAL HIGH (ref 65–99)
Potassium: 4.1 mmol/L (ref 3.5–5.3)
Sodium: 140 mmol/L (ref 135–146)
Total Bilirubin: 0.4 mg/dL (ref 0.2–1.2)
Total Protein: 7.1 g/dL (ref 6.1–8.1)
eGFR: 56 mL/min/1.73m2 — ABNORMAL LOW

## 2021-10-24 LAB — LIPID PANEL
Cholesterol: 137 mg/dL (ref <200)
HDL: 52 mg/dL (ref >=50)
LDL Cholesterol (Calc): 60 mg/dL (calc)
Non-HDL Cholesterol (Calc): 85 mg/dL (calc) (ref <130)
Total CHOL/HDL Ratio: 2.6 (calc) (ref <5.0)
Triglycerides: 169 mg/dL — ABNORMAL HIGH (ref <150)

## 2021-10-24 LAB — HEMOGLOBIN A1C
Hgb A1c MFr Bld: 6.8 % of total Hgb — ABNORMAL HIGH (ref <5.7)
Mean Plasma Glucose: 148 mg/dL
eAG (mmol/L): 8.2 mmol/L

## 2021-10-31 ENCOUNTER — Ambulatory Visit: Payer: Self-pay

## 2021-10-31 ENCOUNTER — Telehealth: Payer: BC Managed Care – PPO

## 2021-10-31 DIAGNOSIS — I63511 Cerebral infarction due to unspecified occlusion or stenosis of right middle cerebral artery: Secondary | ICD-10-CM

## 2021-10-31 DIAGNOSIS — F339 Major depressive disorder, recurrent, unspecified: Secondary | ICD-10-CM

## 2021-10-31 DIAGNOSIS — I69354 Hemiplegia and hemiparesis following cerebral infarction affecting left non-dominant side: Secondary | ICD-10-CM

## 2021-10-31 DIAGNOSIS — E782 Mixed hyperlipidemia: Secondary | ICD-10-CM

## 2021-10-31 DIAGNOSIS — E1165 Type 2 diabetes mellitus with hyperglycemia: Secondary | ICD-10-CM

## 2021-10-31 DIAGNOSIS — I10 Essential (primary) hypertension: Secondary | ICD-10-CM

## 2021-10-31 NOTE — Chronic Care Management (AMB) (Signed)
? Care Management ?  ? RN Visit Note ? ?10/31/2021 ?Name: [name] MRN: [id number] DOB: 23-Oct-1955 ? ?Subjective: ?[name] is a 66 y.o. year old female who is a primary care patient of Jearld Fenton, NP. The care management team was consulted for assistance with disease management and care coordination needs.   ? ?Engaged with patient by telephone for initial visit in response to provider referral for case management and/or care coordination services.  ? ?Consent to Services:  ? Ms. Berhe was given information about Care Management services today including:  ?Care Management services includes personalized support from designated clinical staff supervised by her physician, including individualized plan of care and coordination with other care providers ?24/7 contact phone numbers for assistance for urgent and routine care needs. ?The patient may stop case management services at any time by phone call to the office staff. ? ?Patient agreed to services and consent obtained.  ? ?Assessment: Review of patient past medical history, allergies, medications, health status, including review of consultants reports, laboratory and other test data, was performed as part of comprehensive evaluation and provision of chronic care management services.  ? ?SDOH (Social Determinants of Health) assessments and interventions performed:  ?SDOH Interventions   ? ?Flowsheet Row Most Recent Value  ?SDOH Interventions   ?Food Insecurity Interventions Intervention Not Indicated  ?Housing Interventions Intervention Not Indicated  ?Physical Activity Interventions Intervention Not Indicated  ?Stress Interventions Intervention Not Indicated  ?Social Connections Interventions Intervention Not Indicated  ?Transportation Interventions Intervention Not Indicated  ? ?  ?  ? ?Care Plan ? ?Allergies  ?Allergen Reactions  ? Azithromycin Itching  ?  Muscle aches  ? Erythromycin Nausea And Vomiting  ? Penicillins Nausea And Vomiting   ?  Did it involve swelling of the face/tongue/throat, SOB, or low BP? No ?Did it involve sudden or severe rash/hives, skin peeling, or any reaction on the inside of your mouth or nose? No ?Did you need to seek medical attention at a hospital or doctor's office? No ?When did it last happen?      childhood allergy ?If all above answers are ?NO?, may proceed with cephalosporin use. ?  ? Tetracycline Nausea And Vomiting  ? ? ?Outpatient Encounter Medications as of 10/31/2021  ?Medication Sig  ? amLODipine (NORVASC) 5 MG tablet TAKE 1 TABLET(5 MG) BY MOUTH DAILY  ? aspirin EC 81 MG EC tablet Take 1 tablet (81 mg total) by mouth daily.  ? BIKTARVY 50-200-25 MG TABS tablet TAKE ONE TABLET BY MOUTH DAILY  ? cetirizine (ZYRTEC) 10 MG tablet Take 10 mg by mouth daily.  ? fluticasone (FLONASE) 50 MCG/ACT nasal spray Place 1 spray into both nostrils daily as needed for allergies or rhinitis.  ? FREESTYLE LITE test strip TEST BLOOD SUGAR TWICE DAILY AS DIRECTED  ? glipiZIDE (GLUCOTROL) 10 MG tablet TAKE 1 TABLET TWO TIMES DAILY BEFORE MEALS. MUST SCHEDULE PHYSICAL  ? glucose 4 GM chewable tablet Chew 1 tablet (4 g total) by mouth as needed for low blood sugar.  ? KLOR-CON M20 20 MEQ tablet TAKE 1 TABLET DAILY. MUST SCHEDULE PHYSICAL.  ? LEVEMIR FLEXTOUCH 100 UNIT/ML FlexTouch Pen INJECT 10 UNITS INTO THE SKIN ONCE DAILY  ? mirtazapine (REMERON) 15 MG tablet Take 2 tablets (30 mg total) by mouth at bedtime.  ? ondansetron (ZOFRAN-ODT) 4 MG disintegrating tablet Take 1-2 tablets (4-8 mg total) by mouth every 8 (eight) hours as needed for nausea or vomiting.  ? rosuvastatin (CRESTOR) 10 MG tablet TAKE  1 TABLET DAILY  ? topiramate (TOPAMAX) 100 MG tablet TAKE 1 TABLET AT BEDTIME AS NEEDED  ? triamcinolone cream (KENALOG) 0.1 % Apply 1 application topically 2 (two) times daily.  ? ezetimibe (ZETIA) 10 MG tablet Take 1 tablet (10 mg total) by mouth daily.  ? Insulin Pen Needle (B-D UF III MINI PEN NEEDLES) 31G X 5 MM MISC USE TO  INJECT INSULIN SIX TIMES PER DAY  ? sitaGLIPtin (JANUVIA) 100 MG tablet Take 1 tablet (100 mg total) by mouth daily.  ? ?No facility-administered encounter medications on file as of 10/31/2021.  ? ? ?Patient Active Problem List  ? Diagnosis Date Noted  ? Hemiparesis affecting left side as late effect of cerebrovascular accident ([place]) 07/10/2021  ? HTN (hypertension) 03/21/2021  ? Eczema 03/21/2021  ? CKD (chronic kidney disease) stage 3, GFR 30-59 ml/min ([facility]) 03/21/2021  ? Overweight with body mass index (BMI) of 26 to 26.9 in adult 03/21/2021  ? Migraine 02/23/2018  ? Asymptomatic HIV infection, with no history of HIV-related illness ([place])   ? HLD (hyperlipidemia) 08/02/2014  ? DM (diabetes mellitus), type 2 ([place]) 07/31/2014  ? Ulcerative colitis ([place]) 06/26/2008  ? Depression, recurrent ([place]) 04/04/2008  ? ? ?Conditions to be addressed/monitored: HTN, HLD, DMII, Depression, and history of CVA ? ?Care Plan : RNCM: General Plan of Care (Adult) For Chronic Disease Management and Care Coordination Needs  ?Updates made by Vanita Ingles, RN since 10/31/2021 12:00 AM  ?  ? ?Problem: RNCM: Development of Plan for Chronic Disease Management (HTN, HLD, DM, CVA, Depression)   ?Priority: High  ?  ? 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  ?Short term memory residuals from CVA in August of 2019 ? ?RNCM Clinical Goal(s):  ?Patient will verbalize understanding of plan for management of HTN, HLD, DMII,  Depression, and history or CVA as evidenced by keeping appointments, following dietary restrictions, getting medication needs met, and following the plan of care by pcp and specialist for effective management of chronic conditions ?take all medications exactly as prescribed and will call provider for medication related questions as evidenced by The patient is compliant with medications but she does have issues with cost constraints for Junuvia and Zetia. Will do a referral for the Pharm D for assistance with expressed needs.     ?attend all scheduled medical appointments: 04-24-2022 at 0800 am as evidenced by keeping appointments and calling the office for needed schedule changes        ?demonstrate improved and ongoing adherence to prescribed treatment plan for HTN, HLD, DMII, Depression, and history of CVA as evidenced by stable VS, stable lab work, no exacerbation of conditions, and working with the CCM team to effectively manage health and well being ?continue to work with Consulting civil engineer and/or Social Worker to address care management and care coordination needs related to HTN, HLD, DMII, Depression, and history of CVA as evidenced by adherence to CM Team Scheduled appointments     ?work with pharmacist to address  Financial constraints related to cost of Januvia and Zetia and Medication procurement related to HLD and DMII as evidenced by review of EMR and patient or pharmacist report    ?demonstrate ongoing self health care management ability for effective management of chronic conditions  as evidenced by working with the CCM team through collaboration with Consulting civil engineer, provider, and care team.  ? ?Interventions: ?1:1 collaboration with primary care provider regarding development and update of comprehensive plan of care as evidenced by provider attestation and co-signature ?Inter-disciplinary care team collaboration (see longitudinal plan of care) ?Evaluation of current treatment plan related to  self  management and patient's adherence to plan as established by provider ? ? ?Diabetes:  (Status: New goal. Goal on Track (progressing): YES.) Long Term Goal  ? ?Lab Results  ?Component Value Date  ? HGBA1C 6.8 (H) 04
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
## 2021-11-01 ENCOUNTER — Telehealth: Payer: Self-pay

## 2021-11-01 NOTE — Telephone Encounter (Signed)
? ?  Telephone encounter was:  Successful.  ?11/01/2021 ?Name: [name] MRN: [id number] DOB: 16-May-1956 ? ?[name] is a 66 y.o. year old female who is a primary care patient of Jearld Fenton, NP . The community resource team was consulted for assistance with  Vision ? ?Care guide performed the following interventions:  Pt advised [employer] does not have her contacts that she needs. She states her secondary insurance for [name] is only Medical only. Pt needs assistance with contacts as they as $1000.00 per contact  . ? ?Follow Up Plan:  Care guide will outreach resources to assist patient with vision information ? ?Cameron Proud ?Care Guide, Embedded Care Coordination ?[facility]  Care management  ?[place], [place]  ?Main Phone: [phone]  E-mail: [email or web address]  ?Website: [email or web address] ? ? ? ?

## 2021-11-05 ENCOUNTER — Telehealth: Payer: Self-pay

## 2021-11-05 NOTE — Chronic Care Management (AMB) (Signed)
?  Chronic Care Management  ? ?Note ? ?11/05/2021 ?Name: [name] MRN: [id number] DOB: 1956-04-17 ? ?[name] is a 66 y.o. year old female who is a primary care patient of Jearld Fenton, NP. [name] is currently enrolled in care management services. An additional referral for LCSW And Pharm D  was placed.  ? ?Follow up plan: ?Telephone appointment with care management team member scheduled for: ?Pharm D 11/08/2021 ?LCSW 11/26/2021 ? ?Noreene Larsson, RMA ?Care Guide, Embedded Care Coordination ?[facility]  ?[place], [place] [address] ?Direct Dial: [phone] ?[email or web address] ?Website: [email or web address]  ? ?

## 2021-11-07 ENCOUNTER — Other Ambulatory Visit: Payer: Self-pay

## 2021-11-07 ENCOUNTER — Other Ambulatory Visit: Payer: Self-pay | Admitting: Internal Medicine

## 2021-11-07 DIAGNOSIS — E1165 Type 2 diabetes mellitus with hyperglycemia: Secondary | ICD-10-CM

## 2021-11-07 MED ORDER — SITAGLIPTIN PHOSPHATE 100 MG PO TABS: 100.0000 mg | ORAL_TABLET | Freq: Every day | ORAL | 2 refills | Status: DC

## 2021-11-08 ENCOUNTER — Ambulatory Visit: Payer: BC Managed Care – PPO | Admitting: Pharmacist

## 2021-11-08 DIAGNOSIS — E1165 Type 2 diabetes mellitus with hyperglycemia: Secondary | ICD-10-CM

## 2021-11-08 NOTE — Patient Instructions (Signed)
Thank you allowing the Care Management Team to be a part of your care! It was a pleasure speaking with you today! ?  ?  ?Care Management Team  ?  ?Noreene Larsson RN, MSN, CCM ?Nurse Care Coordinator  ?[phone] ?  ?Wallace Cullens PharmD  ?Clinical Pharmacist  ?[phone] ?  ?Christa See, MSW, LCSW ?Clinical Social Worker ?[phone] ? ?Visit Information ? ? Goals Addressed   ? ?  ?  ?  ?  ? This Visit's Progress  ?  Patient Stated     ?  It was a pleasure speaking with you today. ? ?Our next telephone call is scheduled for 11/13/2021 at 9:45 am. Please have your medications with you at the time of this call for [place] to review. ? ?Thank you! ? ?Wallace Cullens, PharmD, BCACP ?Clinical Pharmacist ?[facility] ?[place] ?[phone] ? ?  ? ?  ? ? ?Ms. Behler was given information about Care Management services today including:  ?Care Management services include personalized support from designated clinical staff supervised by her physician, including individualized plan of care and coordination with other care providers ?24/7 contact phone numbers for assistance for urgent and routine care needs. ?The patient may stop CCM services at any time (effective at the end of the month) by phone call to the office staff. ? ?Patient agreed to services and verbal consent obtained.  ? ?Patient verbalizes understanding of instructions and care plan provided today and agrees to view in [place]. Active MyChart status confirmed with patient.   ? ?Telephone follow up appointment with care management team member scheduled for:11/13/2021 at 9:45 am ? ?

## 2021-11-08 NOTE — Chronic Care Management (AMB) (Signed)
?Care Management  ? ?Pharmacy Note ? ?11/08/2021 ?Name: Tarika Mckethan MRN: [id number] DOB: [birth year] ? ?Subjective: ?Hera Celaya is a 66 y.o. year old female who is a primary care patient of Jearld Fenton, NP. The Care Management team was consulted for assistance with care management and care coordination needs.   ? ?Was unable to reach patient via telephone and leave HIPAA compliant voicemail asking patient to return my call.  ? ?Receive a phone call back from patient ? ? ?Engaged with patient by telephone for initial visit in response to provider referral for pharmacy case management and/or care coordination services.  ? ?The patient was given information about Care Management services today including:  ?Care Management services includes personalized support from designated clinical staff supervised by the patient's primary care provider, including individualized plan of care and coordination with other care providers. ?24/7 contact phone numbers for assistance for urgent and routine care needs. ?The patient may stop case management services at any time by phone call to the office staff. ? ?Patient agreed to services and consent obtained. ? ?Assessment:  Review of patient status, including review of consultants reports, laboratory and other test data, was performed as part of comprehensive evaluation and provision of chronic care management services.  ? ?SDOH (Social Determinants of Health) assessments and interventions performed:   ? ?Objective: ? ?Lab Results  ?Component Value Date  ? CREATININE 1.10 (H) 10/23/2021  ? CREATININE 1.00 07/10/2021  ? CREATININE 1.06 (H) 04/10/2021  ? ? ? ?Care Plan ? ?Allergies  ?Allergen Reactions  ? Azithromycin Itching  ?  Muscle aches  ? Erythromycin Nausea And Vomiting  ? Penicillins Nausea And Vomiting  ?  Did it involve swelling of the face/tongue/throat, SOB, or low BP? No ?Did it involve sudden or severe rash/hives, skin peeling, or any reaction on the inside of  your mouth or nose? No ?Did you need to seek medical attention at a hospital or doctor's office? No ?When did it last happen?      childhood allergy ?If all above answers are ?NO?, may proceed with cephalosporin use. ?  ? Tetracycline Nausea And Vomiting  ? ? ?Medications Reviewed Today   ? ? Reviewed by Vanita Ingles, RN (Case Manager) on 10/31/21 at [place] List Status: <None>  ? ?Medication Order Taking? Sig Documenting Provider Last Dose Status Informant  ?amLODipine (NORVASC) 5 MG tablet [id number]  TAKE 1 TABLET(5 MG) BY MOUTH DAILY Jearld Fenton, NP  Active   ?aspirin EC 81 MG EC tablet [id number]  Take 1 tablet (81 mg total) by mouth daily. Donzetta Starch, NP  Active Self  ?BIKTARVY 50-200-25 MG TABS tablet [id number]  TAKE ONE TABLET BY MOUTH DAILY [place] Callas, NP  Active   ?cetirizine (ZYRTEC) 10 MG tablet [id number]  Take 10 mg by mouth daily. [provider]  Active Self  ?ezetimibe (ZETIA) 10 MG tablet [id number]  Take 1 tablet (10 mg total) by mouth daily. Jearld Fenton, NP  Active   ?fluticasone (FLONASE) 50 MCG/ACT nasal spray [id number]  Place 1 spray into both nostrils daily as needed for allergies or rhinitis. [provider]  Active Self  ?FREESTYLE LITE test strip [id number]  TEST BLOOD SUGAR TWICE DAILY AS DIRECTED Jearld Fenton, NP  Active   ?glipiZIDE (GLUCOTROL) 10 MG tablet [id number]  TAKE 1 TABLET TWO TIMES DAILY BEFORE MEALS. MUST SCHEDULE PHYSICAL Jearld Fenton, NP  Active   ?glucose 4 GM chewable tablet [id number]  Chew 1 tablet (4 g total) by mouth as needed for low blood sugar. Ria Bush, MD  Active Self  ?Insulin Pen Needle (B-D UF III MINI PEN NEEDLES) 31G X 5 MM MISC [id number]  USE TO INJECT INSULIN SIX TIMES PER DAY Jearld Fenton, NP  Active   ?KLOR-CON M20 20 MEQ tablet [id number]  TAKE 1 TABLET DAILY. MUST SCHEDULE PHYSICAL. Jearld Fenton, NP  Active   ?LEVEMIR FLEXTOUCH 100 UNIT/ML FlexTouch Pen [id number]  INJECT 10 UNITS INTO THE SKIN  ONCE DAILY Jearld Fenton, NP  Active   ?mirtazapine (REMERON) 15 MG tablet [id number]  Take 2 tablets (30 mg total) by mouth at bedtime. Jearld Fenton, NP  Active   ?ondansetron (ZOFRAN-ODT) 4 MG disintegrating tablet [id number]  Take 1-2 tablets (4-8 mg total) by mouth every 8 (eight) hours as needed for nausea or vomiting. Tukwila Callas, NP  Active   ?rosuvastatin (CRESTOR) 10 MG tablet [id number]  TAKE 1 TABLET DAILY Jearld Fenton, NP  Active   ?sitaGLIPtin (JANUVIA) 100 MG tablet [id number]  Take 1 tablet (100 mg total) by mouth daily. Jearld Fenton, NP  Active   ?topiramate (TOPAMAX) 100 MG tablet [id number]  TAKE 1 TABLET AT BEDTIME AS NEEDED Jearld Fenton, NP  Active   ?triamcinolone cream (KENALOG) 0.1 % [id number]  Apply 1 application topically 2 (two) times daily. Jearld Fenton, NP  Active   ? ?  ?  ? ?  ? ? ? ?Care Plan : Pharmacy - Medication Assistance  ?Updates made by Rennis Petty, RPH-CPP since 11/08/2021 12:00 AM  ?  ? ?Problem: Disease Progression   ?  ? ?Long-Range Goal: Disease Progression Prevented or Minimized   ?Start Date: 11/08/2021  ?Expected End Date: 02/06/2022  ?Priority: High  ?Note:   ?Current Barriers:  ?Unable to independently afford treatment regimen ? ?Pharmacist Clinical Goal(s):  ?patient will verbalize ability to afford treatment regimen through collaboration with PharmD and provider.  ? ? ?Interventions: ?1:1 collaboration with Jearld Fenton, NP regarding development and update of comprehensive plan of care as evidenced by provider attestation and co-signature ?Inter-disciplinary care team collaboration (see longitudinal plan of care) ?Was unable to reach patient via telephone leave HIPAA compliant voicemail asking patient to return my call. ?Receive call back from patient and speak with patient briefly today ?Unable to complete comprehensive medication review today. Schedule time to complete medication review ? ?Medication assistance: ?Today patient reports  cost of Januvia is difficult to afford through her [employer] commercial prescription drug coverage ?Also reports through her prescriptions coverage can only obtain 90 day supplies of medication from [facility] ?Counsel patient on Januvia savings card from manufacturer ? ? ?Patient Goals/Self-Care Activities ?patient will:  ?- collaborate with provider on medication access solutions ? ?  ? ? ? ?Plan: Telephone follow up appointment with care management team member scheduled for:  11/13/2021 at 9:45 am ? ?Wallace Cullens, PharmD, BCACP, CPP ?Clinical Pharmacist ?[facility] ?[place] ?[phone] ? ? ? ?

## 2021-11-08 NOTE — Telephone Encounter (Signed)
Requested medication (s) are due for refill today: yes to both ? ?Requested medication (s) are on the active medication list: yes ? ?Last refill:  Januvia: 03/01/21 #90 2 RF   needles: 05/13/21 #100 ? ?Future visit scheduled: yes ? ?Notes to clinic:  no refill protocols for Januvia and pen needles ? ? ?Requested Prescriptions  ?Pending Prescriptions Disp Refills  ? JANUVIA 100 MG tablet [Pharmacy Med Name: JANUVIA 100MG TABLETS] 30 tablet   ?  Sig: TAKE 1 TABLET(100 MG) BY MOUTH DAILY  ?  ? There is no refill protocol information for this order  ?  ? Insulin Pen Needle (B-D UF III MINI PEN NEEDLES) 31G X 5 MM MISC [Pharmacy Med Name: B-D PEN NDL MINI 31GX5MM(3/16)PRPL] 100 each 0  ?  Sig: USE TO INJECT INSULIN 6 TIMES A DAY  ?  ? There is no refill protocol information for this order  ?  ? ? ? ? ?

## 2021-11-13 ENCOUNTER — Ambulatory Visit: Payer: BC Managed Care – PPO | Admitting: Pharmacist

## 2021-11-13 DIAGNOSIS — E1165 Type 2 diabetes mellitus with hyperglycemia: Secondary | ICD-10-CM

## 2021-11-13 NOTE — Chronic Care Management (AMB) (Signed)
?Care Management  ? ?Pharmacy Note ? ?11/13/2021 ?Name: [name] MRN: [id number] DOB: February 02, 1956 ? ?Subjective: ?[name] is a 66 y.o. year old female who is a primary care patient of Shannon Fenton, NP. The Care Management team was consulted for assistance with care management and care coordination needs.   ? ?Engaged with patient by telephone for follow up visit in response to provider referral for pharmacy case management and/or care coordination services.  ? ?The patient was given information about Care Management services today including:  ?Care Management services includes personalized support from designated clinical staff supervised by the patient's primary care provider, including individualized plan of care and coordination with other care providers. ?24/7 contact phone numbers for assistance for urgent and routine care needs. ?The patient may stop case management services at any time by phone call to the office staff. ? ?Patient agreed to services and consent obtained. ? ?Assessment:  Review of patient status, including review of consultants reports, laboratory and other test data, was performed as part of comprehensive evaluation and provision of chronic care management services.  ? ?SDOH (Social Determinants of Health) assessments and interventions performed:   ? ? ?Care Plan ? ?Allergies  ?Allergen Reactions  ? Azithromycin Itching  ?  Muscle aches  ? Erythromycin Nausea And Vomiting  ? Penicillins Nausea And Vomiting  ?  Did it involve swelling of the face/tongue/throat, SOB, or low BP? No ?Did it involve sudden or severe rash/hives, skin peeling, or any reaction on the inside of your mouth or nose? No ?Did you need to seek medical attention at a hospital or doctor's office? No ?When did it last happen?      childhood allergy ?If all above answers are ?NO?, may proceed with cephalosporin use. ?  ? Tetracycline Nausea And Vomiting  ? ? ?Medications  ? ? Reviewed by Vanita Ingles, RN  (Case Manager) on 10/31/21 at [place] List Status: <None>  ? ?Medication Order Taking? Sig Documenting Provider Last Dose Status Informant  ?amLODipine (NORVASC) 5 MG tablet [id number]  TAKE 1 TABLET(5 MG) BY MOUTH DAILY Shannon Fenton, NP  Active   ?aspirin EC 81 MG EC tablet [id number]  Take 1 tablet (81 mg total) by mouth daily. Donzetta Starch, NP  Active Self  ?BIKTARVY 50-200-25 MG TABS tablet [id number]  TAKE ONE TABLET BY MOUTH DAILY [place] Callas, NP  Active   ?cetirizine (ZYRTEC) 10 MG tablet [id number]  Take 10 mg by mouth daily. [provider]  Active Self  ?ezetimibe (ZETIA) 10 MG tablet [id number]  Take 1 tablet (10 mg total) by mouth daily. Shannon Fenton, NP  Active   ?fluticasone (FLONASE) 50 MCG/ACT nasal spray [id number]  Place 1 spray into both nostrils daily as needed for allergies or rhinitis. [provider]  Active Self  ?FREESTYLE LITE test strip [id number]  TEST BLOOD SUGAR TWICE DAILY AS DIRECTED Shannon Fenton, NP  Active   ?glipiZIDE (GLUCOTROL) 10 MG tablet [id number]  TAKE 1 TABLET TWO TIMES DAILY BEFORE MEALS. MUST SCHEDULE PHYSICAL Shannon Fenton, NP  Active   ?glucose 4 GM chewable tablet [id number]  Chew 1 tablet (4 g total) by mouth as needed for low blood sugar. Shannon Bush, MD  Active Self  ?Insulin Pen Needle (B-D UF III MINI PEN NEEDLES) 31G X 5 MM MISC [id number]  USE TO INJECT INSULIN SIX TIMES PER DAY Shannon Fenton, NP  Active   ?KLOR-CON M20 20 MEQ tablet  [id number]  TAKE 1 TABLET DAILY. MUST SCHEDULE PHYSICAL. Shannon Fenton, NP  Active   ?LEVEMIR FLEXTOUCH 100 UNIT/ML FlexTouch Pen [id number]  INJECT 10 UNITS INTO THE SKIN ONCE DAILY Shannon Fenton, NP  Active   ?mirtazapine (REMERON) 15 MG tablet [id number]  Take 2 tablets (30 mg total) by mouth at bedtime. Shannon Fenton, NP  Active   ?ondansetron (ZOFRAN-ODT) 4 MG disintegrating tablet [id number]  Take 1-2 tablets (4-8 mg total) by mouth every 8 (eight) hours as needed for nausea or vomiting.  Shannon Callas, NP  Active   ?rosuvastatin (CRESTOR) 10 MG tablet [id number]  TAKE 1 TABLET DAILY Shannon Fenton, NP  Active   ?sitaGLIPtin (JANUVIA) 100 MG tablet [id number]  Take 1 tablet (100 mg total) by mouth daily. Shannon Fenton, NP  Active   ?topiramate (TOPAMAX) 100 MG tablet [id number]  TAKE 1 TABLET AT BEDTIME AS NEEDED Shannon Fenton, NP  Active   ?triamcinolone cream (KENALOG) 0.1 % [id number]  Apply 1 application topically 2 (two) times daily. Shannon Fenton, NP  Active   ? ?  ?  ? ?  ? ? ? ?Care Plan : Pharmacy - Medication Assistance  ?Updates made by Rennis Petty, RPH-CPP since 11/13/2021 12:00 AM  ?  ? ?Problem: Disease Progression   ?  ? ?Long-Range Goal: Disease Progression Prevented or Minimized Completed 11/13/2021  ?Start Date: 11/08/2021  ?Expected End Date: 02/06/2022  ?Priority: High  ?Note:   ?Current Barriers:  ?Unable to independently afford treatment regimen ? ?Pharmacist Clinical Goal(s):  ?patient will verbalize ability to afford treatment regimen through collaboration with PharmD and provider.  ? ? ?Interventions: ?1:1 collaboration with Shannon Fenton, NP regarding development and update of comprehensive plan of care as evidenced by provider attestation and co-signature ?Inter-disciplinary care team collaboration (see longitudinal plan of care) ?Patient declines to complete comprehensive medication review ? ?Medication assistance: ?Today reports unable to use Januvia copay card as found that she has [employer] coverage. ?States will follow up with [facility] to obtain 90 day supplies of medication through her [employer] prescription coverage ?Denies need for further assistance from CM Pharmacist ? ? ?Patient Goals/Self-Care Activities ?patient will:  ?- collaborate with provider on medication access solutions ? ?  ? ? ?Plan: The patient has been provided with contact information for CM Pharmacist and has been advised to call with any medication related questions or  concerns.  ? ?Wallace Cullens, PharmD, BCACP ?Clinical Pharmacist ?[facility] ?[place] ?[phone] ? ? ? ?

## 2021-11-13 NOTE — Patient Instructions (Signed)
Thank you allowing the Care Management Team to be a part of your care! It was a pleasure speaking with you today! ?  ?  ?Care Management Team  ?  ?Noreene Larsson RN, MSN, CCM ?Nurse Care Coordinator  ?[phone] ?  ?Wallace Cullens, PharmD  ?Clinical Pharmacist  ?[phone] ?  ?Christa See, MSW, LCSW ?Clinical Social Worker ?[phone] ? ? ?Visit Information ? ? Goals Addressed   ? ?  ?  ?  ?  ? This Visit's Progress  ?  Patient Stated     ?  It was a pleasure speaking with you today. ? ?Please call me if you have any further medication related needs. ? ?Thank you! ? ?Wallace Cullens, PharmD, BCACP ?Clinical Pharmacist ?[facility] ?[place] ?[phone] ? ?  ? ?  ? ? ?The patient verbalized understanding of instructions, educational materials, and care plan provided today and declined offer to receive copy of patient instructions, educational materials, and care plan.  ? ?The patient has been provided with contact information for the care management team and has been advised to call with any health related questions or concerns.  ? ?

## 2021-11-19 ENCOUNTER — Other Ambulatory Visit: Payer: Self-pay | Admitting: Internal Medicine

## 2021-11-20 NOTE — Telephone Encounter (Signed)
Requested medication (s) are due for refill today:   Yes for all 3 ? ?Requested medication (s) are on the active medication list:   Yes for all 3 ? ?Future visit scheduled:   Yes 04/24/2022 with Rollene Fare  LOV 10/23/2021 ? ? ?Last ordered: Potassium 10/23/2020 #90, 3 refills;   Glipizide 10/23/2020 #180, 3 refills;   Topamax 10/15/2020 #90, 3 refills  ? ?Returned because no protocols assigned to these medications also for [facility] to review for refills prior to upcoming appt.   ? ?Requested Prescriptions  ?Pending Prescriptions Disp Refills  ? KLOR-CON M20 20 MEQ tablet [Pharmacy Med Name: POTASSIUM CHLORIDE ER (DISP) TABS 20MEQ] 90 tablet 3  ?  Sig: TAKE 1 TABLET DAILY. MUST SCHEDULE PHYSICAL.  ?  ? There is no refill protocol information for this order  ?  ? glipiZIDE (GLUCOTROL) 10 MG tablet [Pharmacy Med Name: GLIPIZIDE TABS 10MG] 180 tablet 3  ?  Sig: TAKE 1 TABLET TWO TIMES DAILY BEFORE MEALS. MUST SCHEDULE PHYSICAL  ?  ? There is no refill protocol information for this order  ?  ? topiramate (TOPAMAX) 100 MG tablet [Pharmacy Med Name: TOPIRAMATE TABS 100MG] 90 tablet 3  ?  Sig: TAKE 1 TABLET AT BEDTIME AS NEEDED  ?  ? There is no refill protocol information for this order  ?  ? ?

## 2021-11-22 ENCOUNTER — Other Ambulatory Visit: Payer: Self-pay

## 2021-11-22 MED ORDER — EZETIMIBE 10 MG PO TABS: 10.0000 mg | ORAL_TABLET | Freq: Every day | ORAL | 1 refills | Status: DC

## 2021-11-26 ENCOUNTER — Ambulatory Visit: Payer: BC Managed Care – PPO | Admitting: Licensed Clinical Social Worker

## 2021-11-26 ENCOUNTER — Telehealth: Payer: Self-pay

## 2021-11-26 NOTE — Chronic Care Management (AMB) (Signed)
? ?   Clinical Social Work  ?Care Management  ? Phone Outreach  ? ? ?11/26/2021 ?Name: [name] MRN: [id number] DOB: 1956-06-01 ? ?[name] is a 66 y.o. year old female who is a primary care patient of Jearld Fenton, NP .  ? ?Reason for referral: [employer] .   ? ?CCM LCSW collaborated with Care Guide, Cameron Proud and reached out to patient today by phone to introduce self, assess needs and offer Care Management services and interventions.    Patient was at work and unable to keep phone appointment today and requested to reschedule. ? ?Plan: Pt agreed to contact CCM LCSW after work.  ? ?Review of patient status, including review of consultants reports, relevant laboratory and other test results, and collaboration with appropriate care team members and the patient's provider was performed as part of comprehensive patient evaluation and provision of care management services.   ? ?Christa See, MSW, LCSW ?[place] [facility] Care Management ?[facility] ?[email or web address] ?Phone [phone] ?5:20 PM ? ? ? ? ?  ? ? ?  ?

## 2021-11-26 NOTE — Telephone Encounter (Addendum)
? ?  Telephone encounter was:  Unsuccessful.  ?11/26/2021 ?Name: [name] MRN: [id number] DOB: [birth year] ? ?[name] is a 66 y.o. year old female who is a primary care patient of Jearld Fenton, NP . The community resource team was consulted for assistance with  Vision ? ?Care guide performed the following interventions: Follow up call placed to the patient to discuss status of referral. ?CG has arrange an open line of communication for the [employer] [phone] [facility])) for patient to receive a free copy of the [place] Patient Guide for assistance with special eye contact lenses that patient is in need of for her daily living. CG has also contact SW for support in providing patient resources to [place] for financial assistance to help patient pay for the medical services she needs. ? ?Follow Up Plan:  Care guide will follow up with patient by phone over the next few hours to try providing this information to pt as well. ? ?Cameron Proud ?Care Guide, Embedded Care Coordination ?[facility]  Care management  ?[place], [place]  ?Main Phone: [phone]  E-mail: [email or web address]  ?Website: [email or web address] ? ? ? ?

## 2021-11-28 ENCOUNTER — Telehealth: Payer: Self-pay

## 2021-11-28 NOTE — Telephone Encounter (Signed)
   Telephone encounter was:  Unsuccessful.  11/28/2021 Name: Nelva Hauk MRN: [id number] DOB: 09-Oct-1955  Unsuccessful outbound call made today to assist with:   Vision  Outreach Attempt:  2nd Attempt  A HIPAA compliant voice message was left requesting a return call.  Instructed patient to call back at [phone] at their earliest convenience.  [place] management  [place], [place]  Main Phone: [phone]  E-mail: [email or web address]  Website: [email or web address]

## 2021-12-02 ENCOUNTER — Telehealth: Payer: Self-pay

## 2021-12-02 NOTE — Telephone Encounter (Signed)
   Telephone encounter was:  Unsuccessful.  12/02/2021 Name: [name] MRN: [id number] DOB: 28-May-1956  Unsuccessful outbound call made today to assist with:   Vision  Outreach Attempt:  3rd Attempt.  Referral closed unable to contact patient.  CG Collaborated with LCSW Jasmine in order for patient to be set up with the [employer] for support on contact lenses as well CG signed patient up to receive information regarding [employer].  A HIPAA compliant voice message was left requesting a return call.  Instructed patient to call back at [phone] at their earliest convenience.  [place] management  [place], [place]  Main Phone: [phone]  E-mail: [email or web address]  Website: [email or web address]

## 2021-12-03 ENCOUNTER — Telehealth: Payer: Self-pay

## 2021-12-03 NOTE — Telephone Encounter (Signed)
   Telephone encounter was:  Successful.  12/03/2021 Name: [name] MRN: [id number] DOB: 07-Apr-1956  [name] is a 66 y.o. year old female who is a primary care patient of Jearld Fenton, NP . The community resource team was consulted for assistance with  Vision  Care guide performed the following interventions: Discussed resources to assist with Vision/Contact Lenses . Patient has been educated on [employer] ([employer]) and [employer]. Patient has been advised referral will now be followed by LCSW to assist on the funding program.  Follow Up Plan:  No further follow up planned at this time. The patient has been provided with needed resources.  [place] management  [place], [place]  Main Phone: [phone]  E-mail: [email or web address]  Website: [email or web address]

## 2021-12-10 ENCOUNTER — Telehealth: Payer: BC Managed Care – PPO

## 2021-12-16 ENCOUNTER — Telehealth: Payer: Self-pay | Admitting: Licensed Clinical Social Worker

## 2021-12-16 NOTE — Telephone Encounter (Signed)
    Clinical Social Work  Care Management   Phone Outreach    12/16/2021 Name: [name] MRN: [id number] DOB: 1955-07-18  [name] is a 66 y.o. year old female who is a primary care patient of Jearld Fenton, NP .   Reason for referral: Mental Health Counseling and Resources.    F/U phone call today to assess needs, progress and barriers with care plan goals.   Telephone outreach was unsuccessful. A HIPPA compliant phone message was left for the patient providing contact information and requesting a return call.   Plan:CCM LCSW will wait for return call. If no return call is received, LCSW will attempt to f/up within 2 weekss  Review of patient status, including review of consultants reports, relevant laboratory and other test results, and collaboration with appropriate care team members and the patient's provider was performed as part of comprehensive patient evaluation and provision of care management services.    Christa See, MSW, [facility] Care Management [email or web address] Phone [phone] 6:40 AM

## 2021-12-18 NOTE — Telephone Encounter (Signed)
Pt returned call  states that she received the packet and abut receiving a grant. Please call back

## 2021-12-24 ENCOUNTER — Telehealth: Payer: BC Managed Care – PPO

## 2021-12-24 ENCOUNTER — Telehealth: Payer: Self-pay | Admitting: Licensed Clinical Social Worker

## 2021-12-24 NOTE — Telephone Encounter (Signed)
    Clinical Social Work  Care Management   Phone Outreach    12/24/2021 Name: Jolissa Kapral MRN: [id number] DOB: Feb 25, 1956  Naba Sneed is a 66 y.o. year old female who is a primary care patient of Jearld Fenton, NP .   Reason for referral: [employer] .    F/U phone call today to assess needs, progress and barriers with care plan goals.   2nd unsuccessful telephone outreach attempt.  If unable to reach patient by phone on the 3rd attempt, will discontinue outreach calls but will be available at any time to provide services.   Plan:CCM LCSW will wait for return call. If no return call is received, Will route chart to Care Guide to see if patient would like to reschedule phone appointment   Review of patient status, including review of consultants reports, relevant laboratory and other test results, and collaboration with appropriate care team members and the patient's provider was performed as part of comprehensive patient evaluation and provision of care management services.     Christa See, MSW, [facility] Care Management [email or web address] Phone [phone] 3:39 PM

## 2021-12-25 ENCOUNTER — Ambulatory Visit: Payer: Self-pay | Admitting: Licensed Clinical Social Worker

## 2021-12-31 ENCOUNTER — Other Ambulatory Visit: Payer: Self-pay | Admitting: Internal Medicine

## 2021-12-31 NOTE — Telephone Encounter (Signed)
Requested Prescriptions  Pending Prescriptions Disp Refills  . amLODipine (NORVASC) 5 MG tablet [Pharmacy Med Name: AMLODIPINE BESYLATE TABS 5MG] 90 tablet 0    Sig: TAKE 1 TABLET DAILY     Cardiovascular: Calcium Channel Blockers 2 Passed - 12/31/2021  8:46 AM      Passed - Last BP in normal range    BP Readings from Last 1 Encounters:  10/23/21 116/68         Passed - Last Heart Rate in normal range    Pulse Readings from Last 1 Encounters:  10/23/21 75         Passed - Valid encounter within last 6 months    Recent Outpatient Visits          2 months ago Encounter for screening mammogram for malignant neoplasm of breast   [facility] [place], Coralie Keens, NP   5 months ago Type 2 diabetes mellitus with hyperglycemia, with long-term current use of insulin [facility])   [facility] [place], Coralie Keens, NP   8 months ago Medication side effect, initial encounter   [facility] [place], Coralie Keens, NP   8 months ago Needs flu shot   [facility] [place], [place] W, NP   9 months ago Diabetes mellitus type 2, uncontrolled, with complications [facility])   [facility], Coralie Keens, NP      Future Appointments            In 3 months Baity, Coralie Keens, NP [facility], [facility]

## 2022-01-01 ENCOUNTER — Telehealth: Payer: Self-pay

## 2022-01-01 NOTE — Chronic Care Management (AMB) (Signed)
Care Management Clinical Social Work Note  01/01/2022 Name: [name] MRN: [id number] DOB: 1956-06-11  [name] is a 66 y.o. year old female who is a primary care patient of Jearld Fenton, NP.  The Care Management team was consulted for assistance with chronic disease management and coordination needs.  Engaged with patient by telephone for initial visit in response to provider referral for social work chronic care management and care coordination services  Consent to Services:  [name] was given information about Care Management services today including:  Care Management services includes personalized support from designated clinical staff supervised by her physician, including individualized plan of care and coordination with other care providers 24/7 contact phone numbers for assistance for urgent and routine care needs. The patient may stop case management services at any time by phone call to the office staff.  Patient agreed to services and consent obtained.   Assessment: Review of patient past medical history, allergies, medications, and health status, including review of relevant consultants reports was performed today as part of a comprehensive evaluation and provision of chronic care management and care coordination services.  SDOH (Social Determinants of Health) assessments and interventions performed:    Advanced Directives Status: Not addressed in this encounter.  Care Plan  Allergies  Allergen Reactions   Azithromycin Itching    Muscle aches   Erythromycin Nausea And Vomiting   Penicillins Nausea And Vomiting    Did it involve swelling of the face/tongue/throat, SOB, or low BP? No Did it involve sudden or severe rash/hives, skin peeling, or any reaction on the inside of your mouth or nose? No Did you need to seek medical attention at a hospital or doctor's office? No When did it last happen?      childhood allergy If all above answers are "NO",  may proceed with cephalosporin use.    Tetracycline Nausea And Vomiting    Outpatient Encounter Medications as of 12/25/2021  Medication Sig   aspirin EC 81 MG EC tablet Take 1 tablet (81 mg total) by mouth daily.   BIKTARVY 50-200-25 MG TABS tablet TAKE ONE TABLET BY MOUTH DAILY   cetirizine (ZYRTEC) 10 MG tablet Take 10 mg by mouth daily.   ezetimibe (ZETIA) 10 MG tablet Take 1 tablet (10 mg total) by mouth daily.   fluticasone (FLONASE) 50 MCG/ACT nasal spray Place 1 spray into both nostrils daily as needed for allergies or rhinitis.   FREESTYLE LITE test strip TEST BLOOD SUGAR TWICE DAILY AS DIRECTED   glipiZIDE (GLUCOTROL) 10 MG tablet Take 1 tablet (10 mg total) by mouth 2 (two) times daily before a meal.   glucose 4 GM chewable tablet Chew 1 tablet (4 g total) by mouth as needed for low blood sugar.   Insulin Pen Needle (B-D UF III MINI PEN NEEDLES) 31G X 5 MM MISC USE TO INJECT INSULIN 6 TIMES A DAY   LEVEMIR FLEXTOUCH 100 UNIT/ML FlexTouch Pen INJECT 10 UNITS INTO THE SKIN ONCE DAILY   mirtazapine (REMERON) 15 MG tablet Take 2 tablets (30 mg total) by mouth at bedtime.   ondansetron (ZOFRAN-ODT) 4 MG disintegrating tablet Take 1-2 tablets (4-8 mg total) by mouth every 8 (eight) hours as needed for nausea or vomiting.   potassium chloride SA (KLOR-CON M20) 20 MEQ tablet Take 1 tablet (20 mEq total) by mouth daily.   rosuvastatin (CRESTOR) 10 MG tablet TAKE 1 TABLET DAILY   sitaGLIPtin (JANUVIA) 100 MG tablet Take 1 tablet (100 mg total) by  mouth daily.   topiramate (TOPAMAX) 100 MG tablet TAKE 1 TABLET AT BEDTIME AS NEEDED   triamcinolone cream (KENALOG) 0.1 % Apply 1 application topically 2 (two) times daily.   [DISCONTINUED] amLODipine (NORVASC) 5 MG tablet TAKE 1 TABLET(5 MG) BY MOUTH DAILY   No facility-administered encounter medications on file as of 12/25/2021.    Patient Active Problem List   Diagnosis Date Noted   Hemiparesis affecting left side as late effect of  cerebrovascular accident ([place]) 07/10/2021   HTN (hypertension) 03/21/2021   Eczema 03/21/2021   CKD (chronic kidney disease) stage 3, GFR 30-59 ml/min ([place]) 03/21/2021   Overweight with body mass index (BMI) of 26 to 26.9 in adult 03/21/2021   Migraine 02/23/2018   Asymptomatic HIV infection, with no history of HIV-related illness ([place])    HLD (hyperlipidemia) 08/02/2014   DM (diabetes mellitus), type 2 ([place]) 07/31/2014   Ulcerative colitis ([place]) 06/26/2008   Depression, recurrent ([place]) 04/04/2008    Conditions to be addressed/monitored: Depression; Financial constraints related to obtaining appropriate eyewear  Care Plan : LCSW Plan of Care  Updates made by Rebekah Chesterfield, LCSW since 01/01/2022 12:00 AM     Problem: Quality of Life (General Plan of Care)      Goal: Quality of Life Maintained   Start Date: 12/25/2021  This Visit's Progress: On track  Priority: High  Note:   Current Barriers:  Financial constraints related to obtaining eyewear and depression   CSW Clinical Goal(s):  Patient  will demonstrate a reduction in symptoms related to Stress and Depression through collaboration with Clinical Social Worker, provider, and care team.   Interventions: Patient received supportive resources from care guide. Pt shared needed contact lenses are $2000 after completing an evaluation with [employer] in [place]. Insurance will only provide $100 allowance. Glasses do not work and the prescription will continue to change LCSW will contact Jerri to find out max amount. Patient will be updated Patient was successful in identifying strengths and healthy coping skills to maintain stressors 1:1 collaboration with primary care provider regarding development and update of comprehensive plan of care as evidenced by provider attestation and co-signature Inter-disciplinary care team collaboration (see longitudinal plan of care) Evaluation of current treatment plan related to  self management and  patient's adherence to plan as established by provider Review resources, discussed options and provided patient information about  [facility] [employer] for [employer] Solution-Focused Strategies employed:  Mindfulness or Relaxation training provided Active listening / Reflection utilized  Emotional Support Provided Verbalization of feelings encouraged   Task & activities to accomplish goals: Contact PCP office with any questions or concerns Attend scheduled provider appts Utilize healthy coping skills and/or resources discussed       Follow Up Plan: SW will follow up with patient by phone over the next 4 weeks  Christa See, MSW, [email or web address] Phone [phone] 9:26 AM

## 2022-01-01 NOTE — Telephone Encounter (Signed)
Copied from [place] [phone]. Topic: General - Other >> Jan 01, 2022  3:46 PM Rudene Anda wrote: Reason for CRM: Pharmacist called in asking if he could switch out "LEVEMIR FLEXTOUCH 100 UNIT/ML FlexTouch Pen" to an Rx they have in stock, please advise.

## 2022-01-01 NOTE — Patient Instructions (Signed)
Visit Information  Thank you for taking time to visit with me today. Please don't hesitate to contact me if I can be of assistance to you before our next scheduled telephone appointment.  Following are the goals we discussed today:  Task & activities to accomplish goals: Contact PCP office with any questions or concerns Attend scheduled provider appts Utilize healthy coping skills and/or resources discussed  Our next appointment is by telephone on 01/21/22   Please call the care guide team at [phone] if you need to cancel or reschedule your appointment.   If you are experiencing a Mental Health or [place] or need someone to talk to, please call the Suicide and Crisis Lifeline: 988 call 911   Following is a copy of your full plan of care:  Care Plan : LCSW Plan of Care  Updates made by Rebekah Chesterfield, LCSW since 01/01/2022 12:00 AM     Problem: Quality of Life (General Plan of Care)      Goal: Quality of Life Maintained   Start Date: 12/25/2021  This Visit's Progress: On track  Priority: High  Note:   Current Barriers:  Financial constraints related to obtaining eyewear and depression   CSW Clinical Goal(s):  Patient  will demonstrate a reduction in symptoms related to Stress and Depression through collaboration with Clinical Social Worker, provider, and care team.   Interventions: Patient received supportive resources from care guide. Pt shared needed contact lenses are $2000 after completing an evaluation with [facility] in [place]. Insurance will only provide $100 allowance. Glasses do not work and the prescription will continue to change LCSW will contact Jerri to find out max amount. Patient will be updated Patient was successful in identifying strengths and healthy coping skills to maintain stressors 1:1 collaboration with primary care provider regarding development and update of comprehensive plan of care as evidenced by provider attestation and  co-signature Inter-disciplinary care team collaboration (see longitudinal plan of care) Evaluation of current treatment plan related to  self management and patient's adherence to plan as established by provider Review resources, discussed options and provided patient information about  [facility] [employer] Solution-Focused Strategies employed:  Mindfulness or Relaxation training provided Active listening / Reflection utilized  Emotional Support Provided Verbalization of feelings encouraged   Task & activities to accomplish goals: Contact PCP office with any questions or concerns Attend scheduled provider appts Utilize healthy coping skills and/or resources discussed      Ms. Ausburn was given information about Care Management services by the embedded care coordination team including:  Care Management services include personalized support from designated clinical staff supervised by her physician, including individualized plan of care and coordination with other care providers 24/7 contact phone numbers for assistance for urgent and routine care needs. The patient may stop CCM services at any time (effective at the end of the month) by phone call to the office staff.  Patient agreed to services and verbal consent obtained.   Patient verbalizes understanding of instructions and care plan provided today and agrees to view in [place]. Active MyChart status and patient understanding of how to access instructions and care plan via MyChart confirmed with patient.     Christa See, MSW, [facility] Care Management [email or web address] Phone [phone] 9:26 AM

## 2022-01-02 ENCOUNTER — Telehealth: Payer: BC Managed Care – PPO

## 2022-01-02 ENCOUNTER — Other Ambulatory Visit: Payer: Self-pay

## 2022-01-02 NOTE — Telephone Encounter (Signed)
That is fine, same number of units

## 2022-01-03 MED ORDER — LEVEMIR FLEXTOUCH 100 UNIT/ML ~~LOC~~ SOPN
PEN_INJECTOR | SUBCUTANEOUS | 0 refills | Status: DC
Start: 2022-01-03 — End: 2022-02-05

## 2022-01-06 ENCOUNTER — Ambulatory Visit: Payer: Self-pay

## 2022-01-06 ENCOUNTER — Telehealth: Payer: BC Managed Care – PPO

## 2022-01-06 DIAGNOSIS — J301 Allergic rhinitis due to pollen: Secondary | ICD-10-CM

## 2022-01-06 DIAGNOSIS — E1165 Type 2 diabetes mellitus with hyperglycemia: Secondary | ICD-10-CM

## 2022-01-06 DIAGNOSIS — I69354 Hemiplegia and hemiparesis following cerebral infarction affecting left non-dominant side: Secondary | ICD-10-CM

## 2022-01-06 DIAGNOSIS — F339 Major depressive disorder, recurrent, unspecified: Secondary | ICD-10-CM

## 2022-01-06 DIAGNOSIS — I63511 Cerebral infarction due to unspecified occlusion or stenosis of right middle cerebral artery: Secondary | ICD-10-CM

## 2022-01-06 DIAGNOSIS — I10 Essential (primary) hypertension: Secondary | ICD-10-CM

## 2022-01-06 DIAGNOSIS — E782 Mixed hyperlipidemia: Secondary | ICD-10-CM

## 2022-01-15 ENCOUNTER — Other Ambulatory Visit: Payer: Self-pay

## 2022-01-16 ENCOUNTER — Other Ambulatory Visit: Payer: Self-pay | Admitting: Internal Medicine

## 2022-01-16 NOTE — Telephone Encounter (Signed)
Requested too soon. Requested Prescriptions  Refused Prescriptions Disp Refills  . amLODipine (NORVASC) 5 MG tablet [Pharmacy Med Name: AMLODIPINE BESYLATE TABS 5MG] 90 tablet 3    Sig: TAKE 1 TABLET DAILY     Cardiovascular: Calcium Channel Blockers 2 Passed - 01/16/2022 12:50 AM      Passed - Last BP in normal range    BP Readings from Last 1 Encounters:  10/23/21 116/68         Passed - Last Heart Rate in normal range    Pulse Readings from Last 1 Encounters:  10/23/21 75         Passed - Valid encounter within last 6 months    Recent Outpatient Visits          2 months ago Encounter for screening mammogram for malignant neoplasm of breast   [place], NP   6 months ago Type 2 diabetes mellitus with hyperglycemia, with long-term current use of insulin [facility])   [facility] [place], Coralie Keens, NP   9 months ago Medication side effect, initial encounter   [facility] [place], Coralie Keens, NP   9 months ago Needs flu shot   [facility] [place], [place] W, NP   10 months ago Diabetes mellitus type 2, uncontrolled, with complications [facility])   [facility], Coralie Keens, NP      Future Appointments            In 3 months Baity, Coralie Keens, NP [facility], [facility]

## 2022-01-21 ENCOUNTER — Telehealth: Payer: BC Managed Care – PPO

## 2022-01-24 ENCOUNTER — Telehealth: Payer: Self-pay | Admitting: Licensed Clinical Social Worker

## 2022-01-24 ENCOUNTER — Other Ambulatory Visit: Payer: Self-pay

## 2022-01-24 MED ORDER — AMLODIPINE BESYLATE 5 MG PO TABS: 5.0000 mg | ORAL_TABLET | Freq: Every day | ORAL | 0 refills | Status: DC

## 2022-01-24 NOTE — Telephone Encounter (Signed)
    Clinical Social Work  Care Management   Phone Outreach    01/24/2022 Name: [name] MRN: [id number] DOB: [birth year]  [name] is a 66 y.o. year old female who is a primary care patient of Jearld Fenton, NP .   Reason for referral: [employer] .    F/U phone call today to assess needs, progress and barriers with care plan goals.   Telephone outreach was unsuccessful. A HIPPA compliant phone message was left for the patient providing contact information and requesting a return call.   Plan:CCM LCSW will wait for return call. If no return call is received, LCSW will make another attempt within 30 days  Review of patient status, including review of consultants reports, relevant laboratory and other test results, and collaboration with appropriate care team members and the patient's provider was performed as part of comprehensive patient evaluation and provision of care management services.    Christa See, MSW, [facility] Care Management [email or web address] Phone [phone] 4:38 PM

## 2022-01-28 ENCOUNTER — Encounter: Payer: Self-pay | Admitting: Licensed Clinical Social Worker

## 2022-01-28 ENCOUNTER — Telehealth: Payer: Self-pay | Admitting: Licensed Clinical Social Worker

## 2022-01-28 NOTE — Patient Outreach (Signed)
  Care Coordination   Follow Up Visit Note   01/28/2022 Name: [name] MRN: [id number] DOB: 03/26/1956  [name] is a 66 y.o. year old female who sees [name], Shannon Keens, NP for primary care. I  attempted to follow up with patient and left a detailed message for a return call   SDOH assessments and interventions completed:   No call was unsuccessful   Care Coordination Interventions Activated:  No Care Coordination Interventions:   No, not indicated  Follow up plan: Follow up call scheduled for one week  Encounter Outcome:  No Answer  [name], MSW, [name] Phone [phone] 5:11 PM

## 2022-02-03 ENCOUNTER — Other Ambulatory Visit: Payer: Self-pay | Admitting: Internal Medicine

## 2022-02-03 DIAGNOSIS — E1165 Type 2 diabetes mellitus with hyperglycemia: Secondary | ICD-10-CM

## 2022-02-04 NOTE — Telephone Encounter (Signed)
Requested Prescriptions  Pending Prescriptions Disp Refills  . JANUVIA 100 MG tablet [Pharmacy Med Name: JANUVIA TABS 100MG] 90 tablet 3    Sig: TAKE 1 TABLET DAILY     Endocrinology:  Diabetes - DPP-4 Inhibitors Failed - 02/03/2022  1:48 AM      Failed - Cr in normal range and within 360 days    Creat  Date Value Ref Range Status  10/23/2021 1.10 (H) 0.50 - 1.05 mg/dL Final   Creatinine,U  Date Value Ref Range Status  09/25/2020 145.4 mg/dL Final   Creatinine, Urine  Date Value Ref Range Status  07/10/2021 102 20 - 275 mg/dL Final         Passed - HBA1C is between 0 and 7.9 and within 180 days    Hemoglobin A1C  Date Value Ref Range Status  06/22/2014 7.5 (H) 4.2 - 6.3 % Final    Comment:    The American Diabetes Association recommends that a primary goal of therapy should be <7% and that physicians should reevaluate the treatment regimen in patients with HbA1c values consistently >8%.    Hgb A1c MFr Bld  Date Value Ref Range Status  10/23/2021 6.8 (H) <5.7 % of total Hgb Final    Comment:    For someone without known diabetes, a hemoglobin A1c value of 6.5% or greater indicates that they may have  diabetes and this should be confirmed with a follow-up  test. . For someone with known diabetes, a value <7% indicates  that their diabetes is well controlled and a value  greater than or equal to 7% indicates suboptimal  control. A1c targets should be individualized based on  duration of diabetes, age, comorbid conditions, and  other considerations. . Currently, no consensus exists regarding use of hemoglobin A1c for diagnosis of diabetes for children. Renella Cunas - Valid encounter within last 6 months    Recent Outpatient Visits          3 months ago Encounter for screening mammogram for malignant neoplasm of breast   [facility] [place], Coralie Keens, NP   6 months ago Type 2 diabetes mellitus with hyperglycemia, with long-term current use of  insulin [facility])   [facility], Coralie Keens, NP   9 months ago Medication side effect, initial encounter   [facility] [place], Coralie Keens, NP   10 months ago Needs flu shot   [facility] [place], [place] W, NP   10 months ago Diabetes mellitus type 2, uncontrolled, with complications [facility])   [facility], Coralie Keens, NP      Future Appointments            In 2 months Baity, Coralie Keens, NP [facility], [facility]

## 2022-02-04 NOTE — Telephone Encounter (Signed)
Pt called, left detailed VM to call back to discuss medication. Rx was sent to [facility] on 11/07/21 #90/2 and requested today for [facility].

## 2022-02-05 ENCOUNTER — Other Ambulatory Visit: Payer: Self-pay | Admitting: Internal Medicine

## 2022-02-05 MED ORDER — GLIPIZIDE 10 MG PO TABS: 10.0000 mg | ORAL_TABLET | Freq: Two times a day (BID) | ORAL | 0 refills | Status: DC

## 2022-02-05 MED ORDER — LEVEMIR FLEXTOUCH 100 UNIT/ML ~~LOC~~ SOPN: PEN_INJECTOR | SUBCUTANEOUS | 0 refills | Status: DC

## 2022-02-05 NOTE — Telephone Encounter (Signed)
Requested Prescriptions  Pending Prescriptions Disp Refills  . insulin detemir (LEVEMIR FLEXTOUCH) 100 UNIT/ML FlexPen 15 mL 0    Sig: INJECT 10 UNITS INTO THE SKIN ONCE DAILY     Endocrinology:  Diabetes - Insulins Passed - 02/05/2022 10:40 AM      Passed - HBA1C is between 0 and 7.9 and within 180 days    Hemoglobin A1C  Date Value Ref Range Status  06/22/2014 7.5 (H) 4.2 - 6.3 % Final    Comment:    The American Diabetes Association recommends that a primary goal of therapy should be <7% and that physicians should reevaluate the treatment regimen in patients with HbA1c values consistently >8%.    Hgb A1c MFr Bld  Date Value Ref Range Status  10/23/2021 6.8 (H) <5.7 % of total Hgb Final    Comment:    For someone without known diabetes, a hemoglobin A1c value of 6.5% or greater indicates that they may have  diabetes and this should be confirmed with a follow-up  test. . For someone with known diabetes, a value <7% indicates  that their diabetes is well controlled and a value  greater than or equal to 7% indicates suboptimal  control. A1c targets should be individualized based on  duration of diabetes, age, comorbid conditions, and  other considerations. . Currently, no consensus exists regarding use of hemoglobin A1c for diagnosis of diabetes for children. Renella Cunas - Valid encounter within last 6 months    Recent Outpatient Visits          3 months ago Encounter for screening mammogram for malignant neoplasm of breast   [facility] [place], Coralie Keens, NP   7 months ago Type 2 diabetes mellitus with hyperglycemia, with long-term current use of insulin [facility])   [facility], Coralie Keens, NP   9 months ago Medication side effect, initial encounter   [facility] [place], Coralie Keens, NP   10 months ago Needs flu shot   [facility] [place], [place] W, NP   10 months ago Diabetes mellitus type 2, uncontrolled,  with complications [facility])   [facility] [place], Coralie Keens, NP      Future Appointments            In 2 months Baity, Coralie Keens, NP [facility], [place]           . glipiZIDE (GLUCOTROL) 10 MG tablet 180 tablet 0    Sig: Take 1 tablet (10 mg total) by mouth 2 (two) times daily before a meal.     Endocrinology:  Diabetes - Sulfonylureas Failed - 02/05/2022 10:40 AM      Failed - Cr in normal range and within 360 days    Creat  Date Value Ref Range Status  10/23/2021 1.10 (H) 0.50 - 1.05 mg/dL Final   Creatinine,U  Date Value Ref Range Status  09/25/2020 145.4 mg/dL Final   Creatinine, Urine  Date Value Ref Range Status  07/10/2021 102 20 - 275 mg/dL Final         Passed - HBA1C is between 0 and 7.9 and within 180 days    Hemoglobin A1C  Date Value Ref Range Status  06/22/2014 7.5 (H) 4.2 - 6.3 % Final    Comment:    The American Diabetes Association recommends that a primary goal of therapy should be <7% and that physicians  should reevaluate the treatment regimen in patients with HbA1c values consistently >8%.    Hgb A1c MFr Bld  Date Value Ref Range Status  10/23/2021 6.8 (H) <5.7 % of total Hgb Final    Comment:    For someone without known diabetes, a hemoglobin A1c value of 6.5% or greater indicates that they may have  diabetes and this should be confirmed with a follow-up  test. . For someone with known diabetes, a value <7% indicates  that their diabetes is well controlled and a value  greater than or equal to 7% indicates suboptimal  control. A1c targets should be individualized based on  duration of diabetes, age, comorbid conditions, and  other considerations. . Currently, no consensus exists regarding use of hemoglobin A1c for diagnosis of diabetes for children. Renella Cunas - Valid encounter within last 6 months    Recent Outpatient Visits          3 months ago Encounter for screening mammogram for malignant  neoplasm of breast   [facility] [place], Coralie Keens, NP   7 months ago Type 2 diabetes mellitus with hyperglycemia, with long-term current use of insulin [facility])   [facility], Coralie Keens, NP   9 months ago Medication side effect, initial encounter   [facility] [place], Coralie Keens, NP   10 months ago Needs flu shot   [facility] [place], [place], NP   10 months ago Diabetes mellitus type 2, uncontrolled, with complications [facility])   [facility], Coralie Keens, NP      Future Appointments            In 2 months Baity, Coralie Keens, NP [facility], [facility]

## 2022-02-05 NOTE — Telephone Encounter (Signed)
Medication Refill - Medication: insulin detemir (LEVEMIR FLEXTOUCH) 100 UNIT/ML FlexPen, glipiZIDE (GLUCOTROL) 10 MG tablet  Has the patient contacted their pharmacy? Yes.   (Agent: If no, request that the patient contact the pharmacy for the refill. If patient does not wish to contact the pharmacy document the reason why and proceed with request.) (Agent: If yes, when and what did the pharmacy advise?)  Preferred Pharmacy (with phone number or street name):  [facility] - [address], [place] Phone:  [phone]  Fax:  [phone]     [place] please send to [facility] and the Glipizide to  [place], [place] [address] AT [place] Phone:  [phone]  Fax:  [phone]      Has the patient been seen for an appointment in the last year OR does the patient have an upcoming appointment? Yes.

## 2022-02-24 ENCOUNTER — Telehealth: Payer: BC Managed Care – PPO

## 2022-03-04 ENCOUNTER — Other Ambulatory Visit: Payer: Self-pay | Admitting: Internal Medicine

## 2022-03-04 DIAGNOSIS — E1165 Type 2 diabetes mellitus with hyperglycemia: Secondary | ICD-10-CM

## 2022-03-05 NOTE — Telephone Encounter (Signed)
Requested medication (s) are due for refill today: Yes  Requested medication (s) are on the active medication list: Yes  Last refill:  02/25/21  Future visit scheduled: Yes  Notes to clinic:  Prescription has expired.    Requested Prescriptions  Pending Prescriptions Disp Refills   FREESTYLE LITE test strip [Pharmacy Med Name: FREESTYLE LITE TEST  TES]  11    Sig: TEST BLOOD SUGAR TWICE DAILY AS DIRECTED     There is no refill protocol information for this order

## 2022-03-08 DIAGNOSIS — H524 Presbyopia: Secondary | ICD-10-CM | POA: Diagnosis not present

## 2022-03-08 DIAGNOSIS — H18613 Keratoconus, stable, bilateral: Secondary | ICD-10-CM | POA: Diagnosis not present

## 2022-03-08 DIAGNOSIS — H2513 Age-related nuclear cataract, bilateral: Secondary | ICD-10-CM | POA: Diagnosis not present

## 2022-03-08 DIAGNOSIS — E119 Type 2 diabetes mellitus without complications: Secondary | ICD-10-CM | POA: Diagnosis not present

## 2022-03-08 LAB — HM DIABETES EYE EXAM

## 2022-03-13 ENCOUNTER — Telehealth: Payer: BC Managed Care – PPO

## 2022-03-13 ENCOUNTER — Ambulatory Visit: Payer: Self-pay

## 2022-03-13 NOTE — Patient Outreach (Signed)
  Care Coordination   03/13/2022 Name: [name] MRN: [id number] DOB: February 15, 1956   Care Coordination Outreach Attempts:  An unsuccessful telephone outreach was attempted today to offer the patient information about available care coordination services as a benefit of their health plan.   Follow Up Plan:  Additional outreach attempts will be made to offer the patient care coordination information and services.   Encounter Outcome:  No Answer  Care Coordination Interventions Activated:  No   Care Coordination Interventions:  No, not indicated    Noreene Larsson RN, MSN, CCM Community Care Coordinator [place] [facility] Mobile: [phone]

## 2022-03-14 ENCOUNTER — Encounter: Payer: Self-pay | Admitting: Internal Medicine

## 2022-03-17 ENCOUNTER — Other Ambulatory Visit: Payer: Self-pay | Admitting: Internal Medicine

## 2022-03-18 ENCOUNTER — Ambulatory Visit: Payer: Self-pay

## 2022-03-18 NOTE — Telephone Encounter (Signed)
I can see her at 11:20 tomorrow

## 2022-03-18 NOTE — Telephone Encounter (Signed)
  Chief Complaint: back pain Symptoms: mid to lower back pain on the side, 7-8/10, worse with movement  Frequency: 2 weeks Pertinent Negatives: NA Disposition: [] ED /[] Urgent Care (no appt availability in office) / [] Appointment(In office/virtual)/ []  [place] [facility]/ [] Home Care/ [] Refused Recommended Disposition /[] [place] [facility]/ [x]  Follow-up with PCP Additional Notes: pt states she moved some furniture approx 2 weeks ago but was unsure if that caused back pain. Pt only off tomorrow and no appts available. I offered UC and virtual UC but pt refused. She prefers to see Rollene Fare, NP only. I advised pt that I would send message back to see if anything can be prescribed or pt can be worked in. Pt states she can come in at 0800 in the morning.   Summary: back pain   Pt states she has had back pain x2w   Pt states pain is intense when moving and breathing   Pt declined appt for today due to scheduling conflict   Please fu w/ pt      Reason for Disposition  [1] MODERATE back pain (e.g., interferes with normal activities) AND [2] present > 3 days  Answer Assessment - Initial Assessment Questions 1. ONSET: "When did the pain begin?"      2 weeks  2. LOCATION: "Where does it hurt?" (upper, mid or lower back)     Mid to lower  3. SEVERITY: "How bad is the pain?"  (e.g., Scale 1-10; mild, moderate, or severe)   - MILD (1-3): Doesn't interfere with normal activities.    - MODERATE (4-7): Interferes with normal activities or awakens from sleep.    - SEVERE (8-10): Excruciating pain, unable to do any normal activities.      7/8 4. PATTERN: "Is the pain constant?" (e.g., yes, no; constant, intermittent)      Moving and walking  7. BACK OVERUSE:  "Any recent lifting of heavy objects, strenuous work or exercise?"     Moved furniture  8. MEDICINES: "What have you taken so far for the pain?" (e.g., nothing, acetaminophen, NSAIDS)     Ibuprofen once 10. OTHER SYMPTOMS: "Do you have  any other symptoms?" (e.g., fever, abdomen pain, burning with urination, blood in urine)  Protocols used: Back Pain-A-AH

## 2022-03-18 NOTE — Telephone Encounter (Signed)
Pt agreed to 11:20 03/19/2022.   Thanks,   -Mickel Baas

## 2022-03-19 ENCOUNTER — Encounter: Payer: Self-pay | Admitting: Internal Medicine

## 2022-03-19 ENCOUNTER — Ambulatory Visit (INDEPENDENT_AMBULATORY_CARE_PROVIDER_SITE_OTHER): Payer: BC Managed Care – PPO | Admitting: Internal Medicine

## 2022-03-19 VITALS — BP 118/62 | HR 95 | Temp 97.3°F | Wt 148.0 lb

## 2022-03-19 DIAGNOSIS — S39012A Strain of muscle, fascia and tendon of lower back, initial encounter: Secondary | ICD-10-CM | POA: Diagnosis not present

## 2022-03-19 DIAGNOSIS — Z23 Encounter for immunization: Secondary | ICD-10-CM | POA: Diagnosis not present

## 2022-03-19 MED ORDER — METHOCARBAMOL 500 MG PO TABS: 500.0000 mg | ORAL_TABLET | Freq: Three times a day (TID) | ORAL | 0 refills | Status: DC | PRN

## 2022-03-19 NOTE — Telephone Encounter (Signed)
Requested Prescriptions  Pending Prescriptions Disp Refills  . rosuvastatin (CRESTOR) 10 MG tablet [Pharmacy Med Name: ROSUVASTATIN TABS 10MG] 90 tablet 2    Sig: TAKE 1 TABLET DAILY     Cardiovascular:  Antilipid - Statins 2 Failed - 03/17/2022  1:26 AM      Failed - Cr in normal range and within 360 days    Creat  Date Value Ref Range Status  10/23/2021 1.10 (H) 0.50 - 1.05 mg/dL Final   Creatinine,U  Date Value Ref Range Status  09/25/2020 145.4 mg/dL Final   Creatinine, Urine  Date Value Ref Range Status  07/10/2021 102 20 - 275 mg/dL Final         Failed - Lipid Panel in normal range within the last 12 months    Cholesterol  Date Value Ref Range Status  10/23/2021 137 <200 mg/dL Final  06/22/2014 211 (H) 0 - 200 mg/dL Final   Ldl Cholesterol, Calc  Date Value Ref Range Status  06/22/2014 SEE COMMENT 0 - 100 mg/dL Final    Comment:    LDL/VLDL - Unable to report VLDL and LDL due to a  - Triglyceride value that is 400 mg/dL or   - greater.    LDL Cholesterol (Calc)  Date Value Ref Range Status  10/23/2021 60 mg/dL (calc) Final    Comment:    Reference range: <100 . Desirable range <100 mg/dL for primary prevention;   <70 mg/dL for patients with CHD or diabetic patients  with > or = 2 CHD risk factors. Marland Kitchen LDL-C is now calculated using the Martin-Hopkins  calculation, which is a validated novel method providing  better accuracy than the Friedewald equation in the  estimation of LDL-C.  Cresenciano Genre et al. Annamaria Helling. 9381;829(93): 2061-2068  (http://education.[employer].com/faq/FAQ164)    Direct LDL  Date Value Ref Range Status  11/07/2014 76.0 mg/dL Final    Comment:    Optimal:  <100 mg/dLNear or Above Optimal:  100-129 mg/dLBorderline High:  130-159 mg/dLHigh:  160-189 mg/dLVery High:  >190 mg/dL   HDL Cholesterol  Date Value Ref Range Status  06/22/2014 42 40 - 60 mg/dL Final   HDL  Date Value Ref Range Status  10/23/2021 52 > OR = 50 mg/dL Final    Triglycerides  Date Value Ref Range Status  10/23/2021 169 (H) <150 mg/dL Final  06/22/2014 473 (H) 0 - 200 mg/dL Final         Passed - Patient is not pregnant      Passed - Valid encounter within last 12 months    Recent Outpatient Visits          4 months ago Encounter for general adult medical examination with abnormal findings   [facility] [place], Coralie Keens, NP   8 months ago Type 2 diabetes mellitus with hyperglycemia, with long-term current use of insulin [facility])   [facility] [place], Coralie Keens, NP   11 months ago Medication side effect, initial encounter   [facility] [place], Coralie Keens, NP   11 months ago Needs flu shot   [facility] [place], [place], NP   12 months ago Diabetes mellitus type 2, uncontrolled, with complications [facility])   [facility], Coralie Keens, NP      Future Appointments            Today Garnette Gunner, Coralie Keens, NP [facility], [place]   In 1 month Holt, Coralie Keens,  NP [place]
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
## 2022-03-19 NOTE — Patient Instructions (Signed)

## 2022-04-01 ENCOUNTER — Encounter: Payer: Self-pay | Admitting: Internal Medicine

## 2022-04-10 ENCOUNTER — Ambulatory Visit: Payer: Self-pay

## 2022-04-10 NOTE — Patient Outreach (Signed)
  Care Coordination   04/10/2022 Name: Spring San MRN: [id number] DOB: 1955/07/23   Care Coordination Outreach Attempts:  An unsuccessful telephone outreach was attempted for a scheduled appointment today.  Follow Up Plan:  Additional outreach attempts will be made to offer the patient care coordination information and services.   Encounter Outcome:  No Answer  Care Coordination Interventions Activated:  No   Care Coordination Interventions:  No, not indicated    Noreene Larsson RN, MSN, [facility]  Mobile: [phone]

## 2022-04-12 ENCOUNTER — Other Ambulatory Visit: Payer: Self-pay | Admitting: Infectious Diseases

## 2022-04-12 DIAGNOSIS — Z21 Asymptomatic human immunodeficiency virus [HIV] infection status: Secondary | ICD-10-CM

## 2022-04-16 ENCOUNTER — Telehealth: Payer: Self-pay

## 2022-04-16 NOTE — Chronic Care Management (AMB) (Signed)
  Care Coordination Note  04/16/2022 Name: [name] MRN: [id number] DOB: 1956/02/11  [name] is a 66 y.o. year old female who is a primary care patient of Jearld Fenton, NP and is actively engaged with the care management team. I reached out to [name] by phone today to assist with re-scheduling a follow up visit with the RN Case Manager  Follow up plan: Unsuccessful telephone outreach attempt made. A HIPAA compliant phone message was left for the patient providing contact information and requesting a return call.  The care management team will reach out to the patient again over the next 7 days.  If patient returns call to provider office, please advise to call  [place]  at [place], [place] [facility]  [place], [place] [address] Direct Dial: [phone] [email or web address]

## 2022-04-18 ENCOUNTER — Encounter: Payer: Self-pay | Admitting: Internal Medicine

## 2022-04-18 ENCOUNTER — Ambulatory Visit (INDEPENDENT_AMBULATORY_CARE_PROVIDER_SITE_OTHER): Payer: BC Managed Care – PPO | Admitting: Internal Medicine

## 2022-04-18 VITALS — BP 128/82 | HR 84 | Temp 97.1°F | Wt 150.0 lb

## 2022-04-18 DIAGNOSIS — I10 Essential (primary) hypertension: Secondary | ICD-10-CM

## 2022-04-18 DIAGNOSIS — F339 Major depressive disorder, recurrent, unspecified: Secondary | ICD-10-CM

## 2022-04-18 DIAGNOSIS — Z21 Asymptomatic human immunodeficiency virus [HIV] infection status: Secondary | ICD-10-CM

## 2022-04-18 DIAGNOSIS — E1165 Type 2 diabetes mellitus with hyperglycemia: Secondary | ICD-10-CM

## 2022-04-18 DIAGNOSIS — Z6827 Body mass index (BMI) 27.0-27.9, adult: Secondary | ICD-10-CM

## 2022-04-18 DIAGNOSIS — G43019 Migraine without aura, intractable, without status migrainosus: Secondary | ICD-10-CM

## 2022-04-18 DIAGNOSIS — E782 Mixed hyperlipidemia: Secondary | ICD-10-CM

## 2022-04-18 DIAGNOSIS — Z9109 Other allergy status, other than to drugs and biological substances: Secondary | ICD-10-CM

## 2022-04-18 DIAGNOSIS — N1831 Chronic kidney disease, stage 3a: Secondary | ICD-10-CM

## 2022-04-18 DIAGNOSIS — K5190 Ulcerative colitis, unspecified, without complications: Secondary | ICD-10-CM

## 2022-04-18 DIAGNOSIS — I69354 Hemiplegia and hemiparesis following cerebral infarction affecting left non-dominant side: Secondary | ICD-10-CM

## 2022-04-18 DIAGNOSIS — E663 Overweight: Secondary | ICD-10-CM

## 2022-04-18 MED ORDER — EZETIMIBE 10 MG PO TABS
10.0000 mg | ORAL_TABLET | Freq: Every day | ORAL | 1 refills | Status: DC
Start: 2022-04-18 — End: 2022-10-15

## 2022-04-18 MED ORDER — LEVOCETIRIZINE DIHYDROCHLORIDE 5 MG PO TABS: 5.0000 mg | ORAL_TABLET | Freq: Every evening | ORAL | 1 refills | Status: DC

## 2022-04-18 MED ORDER — TRIAMCINOLONE ACETONIDE 55 MCG/ACT NA AERO: 2.0000 | INHALATION_SPRAY | Freq: Every day | NASAL | 5 refills | Status: DC
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
## 2022-04-18 NOTE — Assessment & Plan Note (Signed)
Encourage diet and exercise for weight loss 

## 2022-04-18 NOTE — Patient Instructions (Signed)

## 2022-04-18 NOTE — Assessment & Plan Note (Signed)
No issues off medications We will monitor

## 2022-04-18 NOTE — Assessment & Plan Note (Signed)
Stable on mirtazapine Support offered

## 2022-04-18 NOTE — Assessment & Plan Note (Signed)
POCT A1c 7.1% Urine microalbumin has been checked within the last year Encouraged her to consume a low-carb diet and exercise for weight loss Continue glipizide, Januvia and Levemir Encourage routine eye exams Routine foot exams Flu vaccine UTD Pneumonia vaccines UTD Encouraged her to get her COVID booster

## 2022-04-18 NOTE — Assessment & Plan Note (Addendum)
Stable C-Met and lipid profile today Encouraged her to consume a low-fat diet Continue rosuvastatin, ezetimibe and aspirin

## 2022-04-18 NOTE — Assessment & Plan Note (Signed)
C-Met today We will monitor

## 2022-04-18 NOTE — Assessment & Plan Note (Signed)
C-Met and lipid profile today Encouraged her to consume a low-fat diet Continue rosuvastatin, ezetimibe and aspirin

## 2022-04-18 NOTE — Assessment & Plan Note (Signed)
Controlled on amlodipine Reinforced DASH diet and exercise for weight loss C-Met today We will monitor 

## 2022-04-18 NOTE — Assessment & Plan Note (Signed)
Continue Biktarvy Encouraged her to schedule a follow-up appointment with ID

## 2022-04-18 NOTE — Assessment & Plan Note (Signed)
Encourage stress reduction techniques Continue Topamax We will monitor symptoms

## 2022-04-20 ENCOUNTER — Other Ambulatory Visit: Payer: Self-pay | Admitting: Internal Medicine

## 2022-04-21 ENCOUNTER — Other Ambulatory Visit: Payer: Self-pay

## 2022-04-21 LAB — LIPID PANEL

## 2022-04-21 LAB — COMPLETE METABOLIC PANEL WITH GFR

## 2022-04-21 MED ORDER — GLIPIZIDE 10 MG PO TABS: 10.0000 mg | ORAL_TABLET | Freq: Two times a day (BID) | ORAL | 0 refills | Status: DC

## 2022-04-21 NOTE — Telephone Encounter (Signed)
Refilled 04/18/2022 confirmed by same pharmacy. Requested Prescriptions  Pending Prescriptions Disp Refills  . NASACORT ALLERGY 24HR 55 MCG/ACT AERO nasal inhaler [Pharmacy Med Name: NASACORT ALLERGY 24HR NASAL 16.9ML] 16.9 mL     Sig: USE 2 SPRAYS IN EACH NOSTRIL DAILY     Ear, Nose, and Throat: Nasal Preparations - Corticosteroids Passed - 04/20/2022  2:09 PM      Passed - Valid encounter within last 12 months    Recent Outpatient Visits          3 days ago Type 2 diabetes mellitus with hyperglycemia, without long-term current use of insulin ([place])   [facility], Coralie Keens, NP   1 month ago Need for influenza vaccination   [facility] [place], Coralie Keens, NP   6 months ago Encounter for general adult medical examination with abnormal findings   [facility] [place], Coralie Keens, NP   9 months ago Type 2 diabetes mellitus with hyperglycemia, with long-term current use of insulin [facility])   [facility] [place], Coralie Keens, NP   1 year ago Medication side effect, initial encounter   [facility] [place], Coralie Keens, NP      Future Appointments            In 3 days [place], Coralie Keens, NP [facility], [place]   In 6 months [place], Coralie Keens, NP [facility], [facility]

## 2022-04-23 ENCOUNTER — Encounter: Payer: Self-pay | Admitting: Internal Medicine

## 2022-04-24 ENCOUNTER — Ambulatory Visit: Payer: BC Managed Care – PPO | Admitting: Internal Medicine

## 2022-04-25 DIAGNOSIS — Z23 Encounter for immunization: Secondary | ICD-10-CM | POA: Diagnosis not present

## 2022-05-05 ENCOUNTER — Other Ambulatory Visit: Payer: Self-pay | Admitting: Internal Medicine

## 2022-05-05 DIAGNOSIS — E1165 Type 2 diabetes mellitus with hyperglycemia: Secondary | ICD-10-CM

## 2022-05-06 NOTE — Telephone Encounter (Signed)
Requested Prescriptions  Pending Prescriptions Disp Refills  . JANUVIA 100 MG tablet [Pharmacy Med Name: JANUVIA TABS 100MG] 90 tablet 1    Sig: TAKE 1 TABLET DAILY     Endocrinology:  Diabetes - DPP-4 Inhibitors Failed - 05/05/2022  2:52 AM      Failed - HBA1C is between 0 and 7.9 and within 180 days    Hemoglobin A1C  Date Value Ref Range Status  06/22/2014 7.5 (H) 4.2 - 6.3 % Final    Comment:    The American Diabetes Association recommends that a primary goal of therapy should be <7% and that physicians should reevaluate the treatment regimen in patients with HbA1c values consistently >8%.    Hgb A1c MFr Bld  Date Value Ref Range Status  10/23/2021 6.8 (H) <5.7 % of total Hgb Final    Comment:    For someone without known diabetes, a hemoglobin A1c value of 6.5% or greater indicates that they may have  diabetes and this should be confirmed with a follow-up  test. . For someone with known diabetes, a value <7% indicates  that their diabetes is well controlled and a value  greater than or equal to 7% indicates suboptimal  control. A1c targets should be individualized based on  duration of diabetes, age, comorbid conditions, and  other considerations. . Currently, no consensus exists regarding use of hemoglobin A1c for diagnosis of diabetes for children. .          Failed - Cr in normal range and within 360 days    Creat  Date Value Ref Range Status  10/23/2021 1.10 (H) 0.50 - 1.05 mg/dL Final   Creatinine,U  Date Value Ref Range Status  09/25/2020 145.4 mg/dL Final   Creatinine, Urine  Date Value Ref Range Status  07/10/2021 102 20 - 275 mg/dL Final         Passed - Valid encounter within last 6 months    Recent Outpatient Visits          2 weeks ago Type 2 diabetes mellitus with hyperglycemia, without long-term current use of insulin ([place])   [facility], Coralie Keens, NP   1 month ago Need for influenza vaccination   [facility] [place], Coralie Keens, NP   6 months ago Encounter for general adult medical examination with abnormal findings   [facility], Coralie Keens, NP   10 months ago Type 2 diabetes mellitus with hyperglycemia, with long-term current use of insulin [facility])   [facility] [place], Coralie Keens, NP   1 year ago Medication side effect, initial encounter   [facility] [place], Coralie Keens, NP      Future Appointments            In 5 months [place], Coralie Keens, NP [facility], [facility]

## 2022-05-07 NOTE — Progress Notes (Signed)
Please make 2 more calls to try to rs

## 2022-05-09 ENCOUNTER — Telehealth: Payer: Self-pay | Admitting: *Deleted

## 2022-05-09 NOTE — Chronic Care Management (AMB) (Signed)
  Care Coordination Note  05/09/2022 Name: Waverly Tarquinio MRN: [id number] DOB: 1956-04-03  Carlota Philley is a 66 y.o. year old female who is a primary care patient of Jearld Fenton, NP and is actively engaged with the care management team. I reached out to [name] by phone today to assist with re-scheduling a follow up visit with the RN Case Manager  Follow up plan: Telephone appointment with care management team member scheduled for: 05/12/2022  Julian Hy, [place] Direct Dial: [phone]

## 2022-05-12 ENCOUNTER — Ambulatory Visit: Payer: Self-pay | Admitting: *Deleted

## 2022-05-12 NOTE — Patient Outreach (Signed)
  Care Coordination   05/12/2022 Name: [name] MRN: [id number] DOB: 03/10/1956   Care Coordination Outreach Attempts:  An unsuccessful telephone outreach was attempted for a scheduled appointment today.  Voice message was received back from member after missed call.  Call placed back to member, again unsuccessful.    Follow Up Plan:  No further outreach attempts will be made at this time. We have been unable to contact the patient to offer or enroll patient in care coordination services  Encounter Outcome:  No Answer  Care Coordination Interventions Activated:  No   Care Coordination Interventions:  No, not indicated    Valente David, RN, MSN, [facility] Care Management Care Management Coordinator [phone]

## 2022-05-12 NOTE — Patient Outreach (Signed)
  Care Coordination   05/12/2022 Name: [name] MRN: [id number] DOB: 01-May-1956   Care Coordination Outreach Attempts:  An unsuccessful telephone outreach was attempted for a scheduled appointment today.  Follow Up Plan:  No further outreach attempts will be made at this time. We have been unable to contact the patient to offer or enroll patient in care coordination services  Encounter Outcome:  No Answer  Care Coordination Interventions Activated:  No   Care Coordination Interventions:  No, not indicated    Valente David, RN, MSN, [facility] Care Management Care Management Coordinator [phone]

## 2022-05-15 ENCOUNTER — Other Ambulatory Visit: Payer: Self-pay | Admitting: Infectious Diseases

## 2022-05-15 DIAGNOSIS — Z21 Asymptomatic human immunodeficiency virus [HIV] infection status: Secondary | ICD-10-CM

## 2022-05-19 ENCOUNTER — Other Ambulatory Visit: Payer: Self-pay | Admitting: Internal Medicine

## 2022-05-19 NOTE — Telephone Encounter (Signed)
Unable to refill per protocol, Rx request is too soon, last refill 04/21/22 for 90 and 1 RF. Will refuse.  Requested Prescriptions  Pending Prescriptions Disp Refills   glipiZIDE (GLUCOTROL) 10 MG tablet [Pharmacy Med Name: GLIPIZIDE TABS 10MG] 180 tablet 3    Sig: TAKE 1 TABLET TWICE A DAY BEFORE MEALS     Endocrinology:  Diabetes - Sulfonylureas Failed - 05/19/2022  1:20 AM      Failed - HBA1C is between 0 and 7.9 and within 180 days    Hemoglobin A1C  Date Value Ref Range Status  06/22/2014 7.5 (H) 4.2 - 6.3 % Final    Comment:    The American Diabetes Association recommends that a primary goal of therapy should be <7% and that physicians should reevaluate the treatment regimen in patients with HbA1c values consistently >8%.    Hgb A1c MFr Bld  Date Value Ref Range Status  10/23/2021 6.8 (H) <5.7 % of total Hgb Final    Comment:    For someone without known diabetes, a hemoglobin A1c value of 6.5% or greater indicates that they may have  diabetes and this should be confirmed with a follow-up  test. . For someone with known diabetes, a value <7% indicates  that their diabetes is well controlled and a value  greater than or equal to 7% indicates suboptimal  control. A1c targets should be individualized based on  duration of diabetes, age, comorbid conditions, and  other considerations. . Currently, no consensus exists regarding use of hemoglobin A1c for diagnosis of diabetes for children. .          Failed - Cr in normal range and within 360 days    Creat  Date Value Ref Range Status  10/23/2021 1.10 (H) 0.50 - 1.05 mg/dL Final   Creatinine,U  Date Value Ref Range Status  09/25/2020 145.4 mg/dL Final   Creatinine, Urine  Date Value Ref Range Status  07/10/2021 102 20 - 275 mg/dL Final         Passed - Valid encounter within last 6 months    Recent Outpatient Visits           1 month ago Type 2 diabetes mellitus with hyperglycemia, without long-term current  use of insulin ([place])   [facility], Coralie Keens, NP   2 months ago Need for influenza vaccination   [facility] [place], Coralie Keens, NP   6 months ago Encounter for general adult medical examination with abnormal findings   [facility] [place], Coralie Keens, NP   10 months ago Type 2 diabetes mellitus with hyperglycemia, with long-term current use of insulin [facility])   [facility], Coralie Keens, NP   1 year ago Medication side effect, initial encounter   [facility] [place], Coralie Keens, NP       Future Appointments             In 1 week Doren Custard, Melton Krebs, NP [facility] for [facility], [facility]   In 5 months [place], Coralie Keens, NP [facility], [facility]            Signed Prescriptions Disp Refills   topiramate (TOPAMAX) 100 MG tablet 90 tablet 0    Sig: TAKE 1 TABLET AT BEDTIME AS NEEDED     Neurology: Anticonvulsants - topiramate & zonisamide Failed - 05/19/2022  1:20 AM      Failed - Cr in  normal range and within 360 days    Creat  Date Value Ref Range Status  10/23/2021 1.10 (H) 0.50 - 1.05 mg/dL Final   Creatinine,U  Date Value Ref Range Status  09/25/2020 145.4 mg/dL Final   Creatinine, Urine  Date Value Ref Range Status  07/10/2021 102 20 - 275 mg/dL Final         Passed - CO2 in normal range and within 360 days    CO2  Date Value Ref Range Status  10/23/2021 23 20 - 32 mmol/L Final   Co2  Date Value Ref Range Status  06/23/2014 27 21 - 32 mmol/L Final         Passed - ALT in normal range and within 360 days    ALT  Date Value Ref Range Status  10/23/2021 23 6 - 29 U/L Final   SGPT (ALT)  Date Value Ref Range Status  06/22/2014 45 U/L Final    Comment:    14-63 NOTE: New Reference Range 01/31/14          Passed - AST in normal range and within 360 days    AST  Date Value Ref Range Status  10/23/2021 21 10 - 35 U/L Final   SGOT(AST)  Date Value  Ref Range Status  06/22/2014 41 (H) 15 - 77 Unit/L Final         Passed - Completed PHQ-2 or PHQ-9 in the last 360 days      Passed - Valid encounter within last 12 months    Recent Outpatient Visits           1 month ago Type 2 diabetes mellitus with hyperglycemia, without long-term current use of insulin ([place])   [facility], Coralie Keens, NP   2 months ago Need for influenza vaccination   [facility] [place], Coralie Keens, NP   6 months ago Encounter for general adult medical examination with abnormal findings   [facility] [place], Coralie Keens, NP   10 months ago Type 2 diabetes mellitus with hyperglycemia, with long-term current use of insulin ([place])   [facility] [place], Coralie Keens, NP   1 year ago Medication side effect, initial encounter   [facility], Coralie Keens, NP       Future Appointments             In 1 week Doren Custard, Melton Krebs, NP [facility] for [facility], [facility]   In 5 months [place], Coralie Keens, NP [name], [facility]             potassium chloride SA (KLOR-CON M20) 20 MEQ tablet 90 tablet 0    Sig: TAKE 1 TABLET DAILY     Endocrinology:  Minerals - Potassium Supplementation Failed - 05/19/2022  1:20 AM      Failed - Cr in normal range and within 360 days    Creat  Date Value Ref Range Status  10/23/2021 1.10 (H) 0.50 - 1.05 mg/dL Final   Creatinine,U  Date Value Ref Range Status  09/25/2020 145.4 mg/dL Final   Creatinine, Urine  Date Value Ref Range Status  07/10/2021 102 20 - 275 mg/dL Final         Passed - K in normal range and within 360 days    Potassium  Date Value Ref Range Status  10/23/2021 4.1 3.5 - 5.3 mmol/L Final  06/23/2014 4.2 3.5 - 5.1 mmol/L  Final         Passed - Valid encounter within last 12 months    Recent Outpatient Visits           1 month ago Type 2 diabetes mellitus with hyperglycemia, without long-term current  use of insulin ([place])   [facility], Coralie Keens, NP   2 months ago Need for influenza vaccination   [facility] [place], Coralie Keens, NP   6 months ago Encounter for general adult medical examination with abnormal findings   [facility] [place], Coralie Keens, NP   10 months ago Type 2 diabetes mellitus with hyperglycemia, with long-term current use of insulin [facility])   [facility], Coralie Keens, NP   1 year ago Medication side effect, initial encounter   [facility] [place], Coralie Keens, NP       Future Appointments             In 1 week Doren Custard, Melton Krebs, NP [facility] for [facility], [facility]   In 5 months [place], Coralie Keens, NP [facility], [facility]

## 2022-05-19 NOTE — Telephone Encounter (Signed)
Requested Prescriptions  Pending Prescriptions Disp Refills   topiramate (TOPAMAX) 100 MG tablet [Pharmacy Med Name: TOPIRAMATE TABS 100MG] 90 tablet 0    Sig: TAKE 1 TABLET AT BEDTIME AS NEEDED     Neurology: Anticonvulsants - topiramate & zonisamide Failed - 05/19/2022  1:20 AM      Failed - Cr in normal range and within 360 days    Creat  Date Value Ref Range Status  10/23/2021 1.10 (H) 0.50 - 1.05 mg/dL Final   Creatinine,U  Date Value Ref Range Status  09/25/2020 145.4 mg/dL Final   Creatinine, Urine  Date Value Ref Range Status  07/10/2021 102 20 - 275 mg/dL Final         Passed - CO2 in normal range and within 360 days    CO2  Date Value Ref Range Status  10/23/2021 23 20 - 32 mmol/L Final   Co2  Date Value Ref Range Status  06/23/2014 27 21 - 32 mmol/L Final         Passed - ALT in normal range and within 360 days    ALT  Date Value Ref Range Status  10/23/2021 23 6 - 29 U/L Final   SGPT (ALT)  Date Value Ref Range Status  06/22/2014 45 U/L Final    Comment:    14-63 NOTE: New Reference Range 01/31/14          Passed - AST in normal range and within 360 days    AST  Date Value Ref Range Status  10/23/2021 21 10 - 35 U/L Final   SGOT(AST)  Date Value Ref Range Status  06/22/2014 41 (H) 15 - 37 Unit/L Final         Passed - Completed PHQ-2 or PHQ-9 in the last 360 days      Passed - Valid encounter within last 12 months    Recent Outpatient Visits           1 month ago Type 2 diabetes mellitus with hyperglycemia, without long-term current use of insulin ([place])   [facility], Coralie Keens, NP   2 months ago Need for influenza vaccination   [facility] [place], Coralie Keens, NP   6 months ago Encounter for general adult medical examination with abnormal findings   [facility] [place], Coralie Keens, NP   10 months ago Type 2 diabetes mellitus with hyperglycemia, with long-term current use of insulin ([place])    [facility] [place], Coralie Keens, NP   1 year ago Medication side effect, initial encounter   [facility] [place], Coralie Keens, NP       Future Appointments             In 1 week Doren Custard, Melton Krebs, NP [facility] for Infectious Disease, [facility]   In 5 months [place], Coralie Keens, NP [facility], [facility]             glipiZIDE (GLUCOTROL) 10 MG tablet [Pharmacy Med Name: GLIPIZIDE TABS 10MG] 180 tablet 3    Sig: TAKE 1 TABLET TWICE A DAY BEFORE MEALS     Endocrinology:  Diabetes - Sulfonylureas Failed - 05/19/2022  1:20 AM      Failed - HBA1C is between 0 and 7.9 and within 180 days    Hemoglobin A1C  Date Value Ref Range Status  06/22/2014 7.5 (H) 4.2 - 6.3 % Final    Comment:  The American Diabetes Association recommends that a primary goal of therapy should be <7% and that physicians should reevaluate the treatment regimen in patients with HbA1c values consistently >8%.    Hgb A1c MFr Bld  Date Value Ref Range Status  10/23/2021 6.8 (H) <5.7 % of total Hgb Final    Comment:    For someone without known diabetes, a hemoglobin A1c value of 6.5% or greater indicates that they may have  diabetes and this should be confirmed with a follow-up  test. . For someone with known diabetes, a value <7% indicates  that their diabetes is well controlled and a value  greater than or equal to 7% indicates suboptimal  control. A1c targets should be individualized based on  duration of diabetes, age, comorbid conditions, and  other considerations. . Currently, no consensus exists regarding use of hemoglobin A1c for diagnosis of diabetes for children. .          Failed - Cr in normal range and within 360 days    Creat  Date Value Ref Range Status  10/23/2021 1.10 (H) 0.50 - 1.05 mg/dL Final   Creatinine,U  Date Value Ref Range Status  09/25/2020 145.4 mg/dL Final   Creatinine, Urine  Date Value Ref Range Status  07/10/2021 102  20 - 275 mg/dL Final         Passed - Valid encounter within last 6 months    Recent Outpatient Visits           1 month ago Type 2 diabetes mellitus with hyperglycemia, without long-term current use of insulin ([place])   [facility], Coralie Keens, NP   2 months ago Need for influenza vaccination   [facility] [place], Coralie Keens, NP   6 months ago Encounter for general adult medical examination with abnormal findings   [facility] [place], Coralie Keens, NP   10 months ago Type 2 diabetes mellitus with hyperglycemia, with long-term current use of insulin ([place])   [facility], Coralie Keens, NP   1 year ago Medication side effect, initial encounter   [facility] [place], Coralie Keens, NP       Future Appointments             In 1 week Doren Custard, Melton Krebs, NP [facility] for Infectious Disease, [facility]   In 5 months [place], Coralie Keens, NP [facility], [facility]             KLOR-CON M20 20 MEQ tablet [employer] Med Name: POTASSIUM CHLORIDE ER (DISP) TABS 20MEQ] 90 tablet 0    Sig: TAKE 1 TABLET DAILY     Endocrinology:  Minerals - Potassium Supplementation Failed - 05/19/2022  1:20 AM      Failed - Cr in normal range and within 360 days    Creat  Date Value Ref Range Status  10/23/2021 1.10 (H) 0.50 - 1.05 mg/dL Final   Creatinine,U  Date Value Ref Range Status  09/25/2020 145.4 mg/dL Final   Creatinine, Urine  Date Value Ref Range Status  07/10/2021 102 20 - 275 mg/dL Final         Passed - K in normal range and within 360 days    Potassium  Date Value Ref Range Status  10/23/2021 4.1 3.5 - 5.3 mmol/L Final  06/23/2014 4.2 3.5 - 5.1 mmol/L Final         Passed - Valid encounter within  last 12 months    Recent Outpatient Visits           1 month ago Type 2 diabetes mellitus with hyperglycemia, without long-term current use of insulin ([place])   [facility] [place],  Coralie Keens, NP   2 months ago Need for influenza vaccination   [facility] [place], Coralie Keens, NP   6 months ago Encounter for general adult medical examination with abnormal findings   [facility] [place], Coralie Keens, NP   10 months ago Type 2 diabetes mellitus with hyperglycemia, with long-term current use of insulin [facility])   [facility], Coralie Keens, NP   1 year ago Medication side effect, initial encounter   [facility] [place], Coralie Keens, NP       Future Appointments             In 1 week Doren Custard, Melton Krebs, NP [facility] for [facility], [facility]   In 5 months [place], Coralie Keens, NP [facility], [facility]

## 2022-05-30 ENCOUNTER — Other Ambulatory Visit: Payer: Self-pay

## 2022-05-30 ENCOUNTER — Encounter: Payer: Self-pay | Admitting: Infectious Diseases

## 2022-05-30 ENCOUNTER — Other Ambulatory Visit (HOSPITAL_COMMUNITY): Payer: Self-pay

## 2022-05-30 ENCOUNTER — Ambulatory Visit (INDEPENDENT_AMBULATORY_CARE_PROVIDER_SITE_OTHER): Payer: BC Managed Care – PPO | Admitting: Infectious Diseases

## 2022-05-30 VITALS — BP 107/74 | HR 88 | Resp 16 | Ht 62.0 in | Wt 152.0 lb

## 2022-05-30 DIAGNOSIS — N1831 Chronic kidney disease, stage 3a: Secondary | ICD-10-CM

## 2022-05-30 DIAGNOSIS — R252 Cramp and spasm: Secondary | ICD-10-CM | POA: Insufficient documentation

## 2022-05-30 DIAGNOSIS — Z21 Asymptomatic human immunodeficiency virus [HIV] infection status: Secondary | ICD-10-CM | POA: Diagnosis not present

## 2022-05-30 DIAGNOSIS — Z0000 Encounter for general adult medical examination without abnormal findings: Secondary | ICD-10-CM | POA: Diagnosis not present

## 2022-05-30 MED ORDER — DOVATO 50-300 MG PO TABS: 1.0000 | ORAL_TABLET | Freq: Every day | ORAL | 11 refills | Status: DC

## 2022-05-30 NOTE — Assessment & Plan Note (Signed)
Reviewed CMP recently and K looks OK. No signs of claudication or swelling. Doubt vascular component.  Will trial magnesium citrate at night 250 mg and see if that helps. Could also try magnesium oil/lotion to legs at night to see if that is helpful.

## 2022-05-30 NOTE — Assessment & Plan Note (Signed)
Counseling regarding Shingrix and RSV vaccines discussed today.  She will look to schedule both of those at local pharmacy.  As far as her Pap smears.  It is currently recommended for women living with HIV to proceed with lifelong screenings at interval no more than every 3 years.  I am happy to help her with this screening service or if she prefer to follow-up with her PCP she can do that as well.

## 2022-05-30 NOTE — Progress Notes (Signed)
Patient: [name]  DOB: 04/03/1956 MRN: [id number] PCP: Jearld Fenton, NP    Subjective:  Brief ID:  [name] is a 66 y.o. female with HIV infection diagnosed August 2019 on routine screening after having had a stroke. VL 118,000 copies, CD4 500.  HIV Risk: heterosexual.  OI Hx: none  Previous Regimens:  Biktarvy 02-2018: suppressed  Dovato 05/2022 (CKD switch)   Resistance Testing:  02-2018 wildtype virus    Chief Complaint  Patient presents with   Follow-up      HPI/ROS:  [name] is here today for routine HIV care and follow-up.  She continues her Biktarvy once daily without any concern over missed doses or side effects to medication.  She has co-pay assistance active to help her afford this. She says she is doing very very well since her last office visit together.  She has been very happy.  Back to work full-time.  We spent time catching up on her home life.  She is hopeful that some marriage troubles will come to an end in 2024. Pap smear was in 2020 --> normal testing  She received a high-dose influenza vaccine September 6 of this year.  Last pneumococcal vaccination was in 2020 to complete her series.  Tetanus shot 2020. Diabetic with last A1c in April 6.8% She describes lower leg cramping that is worse at night.  Hopeful that I have some recommendations to help with this process.  She continues to sing at church.   Review of Systems  Constitutional:  Negative for chills and fever.  HENT:  Negative for sore throat.   Respiratory:  Negative for cough and shortness of breath.   Cardiovascular: Negative.   Gastrointestinal:  Negative for abdominal pain, diarrhea and vomiting.  Musculoskeletal:  Negative for myalgias and neck pain.  Skin:  Negative for rash.  Neurological:  Negative for headaches.  Psychiatric/Behavioral:  The patient is not nervous/anxious.      Past Medical History:  Diagnosis Date   Arthritis    fingers   Asthma     Diabetes mellitus without complication ([place]) 6-38-75   hx. NIDDM-dx. 3 weeks ago   Headache    migraines - none since stroke   HIV (human immunodeficiency virus infection) ([place])    Hyperlipidemia    hx   Seasonal allergies    Stroke ([place]) 02/18/2018   Residual Left side weakness and speech issues   Ulcerative colitis ([place])    Wears contact lenses    Wears partial dentures    upper    Outpatient Medications Prior to Visit  Medication Sig Dispense Refill   amLODipine (NORVASC) 5 MG tablet Take 1 tablet (5 mg total) by mouth daily. 90 tablet 0   aspirin EC 81 MG EC tablet Take 1 tablet (81 mg total) by mouth daily.     ezetimibe (ZETIA) 10 MG tablet Take 1 tablet (10 mg total) by mouth daily. 90 tablet 1   FREESTYLE LITE test strip TEST BLOOD SUGAR TWICE DAILY AS DIRECTED 100 strip 11   glipiZIDE (GLUCOTROL) 10 MG tablet Take 1 tablet (10 mg total) by mouth 2 (two) times daily before a meal. 180 tablet 0   glucose 4 GM chewable tablet Chew 1 tablet (4 g total) by mouth as needed for low blood sugar. 30 tablet 3   insulin detemir (LEVEMIR FLEXTOUCH) 100 UNIT/ML FlexPen INJECT 10 UNITS INTO THE SKIN ONCE DAILY 15 mL 0   Insulin Pen Needle (B-D UF III  MINI PEN NEEDLES) 31G X 5 MM MISC USE TO INJECT INSULIN 6 TIMES A DAY 100 each 0   JANUVIA 100 MG tablet TAKE 1 TABLET DAILY 90 tablet 1   levocetirizine (XYZAL ALLERGY 24HR) 5 MG tablet Take 1 tablet (5 mg total) by mouth every evening. 90 tablet 1   methocarbamol (ROBAXIN) 500 MG tablet Take 1 tablet (500 mg total) by mouth every 8 (eight) hours as needed for muscle spasms. 20 tablet 0   mirtazapine (REMERON) 15 MG tablet Take 2 tablets (30 mg total) by mouth at bedtime. 180 tablet 3   ondansetron (ZOFRAN-ODT) 4 MG disintegrating tablet Take 1-2 tablets (4-8 mg total) by mouth every 8 (eight) hours as needed for nausea or vomiting. 30 tablet 0   potassium chloride SA (KLOR-CON M20) 20 MEQ tablet TAKE 1 TABLET DAILY 90 tablet 0   rosuvastatin  (CRESTOR) 10 MG tablet TAKE 1 TABLET DAILY 90 tablet 2   topiramate (TOPAMAX) 100 MG tablet TAKE 1 TABLET AT BEDTIME AS NEEDED 90 tablet 0   triamcinolone (NASACORT) 55 MCG/ACT AERO nasal inhaler Place 2 sprays into the nose daily. 1 each 5   triamcinolone cream (KENALOG) 0.1 % Apply 1 application topically 2 (two) times daily. 30 g 0   BIKTARVY 50-200-25 MG TABS tablet TAKE ONE TABLET BY MOUTH DAILY 30 tablet 0   No facility-administered medications prior to visit.     Allergies  Allergen Reactions   Azithromycin Itching    Muscle aches   Erythromycin Nausea And Vomiting   Penicillins Nausea And Vomiting    Did it involve swelling of the face/tongue/throat, SOB, or low BP? No Did it involve sudden or severe rash/hives, skin peeling, or any reaction on the inside of your mouth or nose? No Did you need to seek medical attention at a hospital or doctor's office? No When did it last happen?      childhood allergy If all above answers are "NO", may proceed with cephalosporin use.    Tetracycline Nausea And Vomiting    Social History   Tobacco Use   Smoking status: Never   Smokeless tobacco: Never  Vaping Use   Vaping Use: Never used  Substance Use Topics   Alcohol use: Not Currently    Alcohol/week: 0.0 standard drinks of alcohol   Drug use: No    Objective:   Vitals:   05/30/22 0906  BP: 107/74  Pulse: 88  Resp: 16  SpO2: 98%  Weight: 152 lb (68.9 kg)  Height: 5' 2"  (1.575 m)    Body mass index is 27.8 kg/m.  Physical Exam Constitutional:      Appearance: Normal appearance. She is not ill-appearing.  HENT:     Mouth/Throat:     Mouth: Mucous membranes are moist.     Pharynx: Oropharynx is clear.  Eyes:     General: No scleral icterus. Cardiovascular:     Rate and Rhythm: Normal rate and regular rhythm.  Pulmonary:     Effort: Pulmonary effort is normal.  Neurological:     Mental Status: She is oriented to person, place, and time.  Psychiatric:         Mood and Affect: Mood normal.        Thought Content: Thought content normal.     Lab Results: HIV 1 RNA Quant (Copies/mL)  Date Value  07/24/2021 Not Detected  11/26/2020 Not Detected  05/28/2020 <20   CD4 T Cell Abs (/uL)  Date Value  07/24/2021 885  11/26/2020 952  05/28/2020 876    Lab Results  Component Value Date   WBC 5.5 10/23/2021   HGB 12.5 10/23/2021   HCT 38.3 10/23/2021   MCV 93.2 10/23/2021   PLT 213 10/23/2021    Lab Results  Component Value Date   CREATININE 1.10 (H) 10/23/2021   BUN 13 10/23/2021   NA 140 10/23/2021   K 4.1 10/23/2021   CL 109 10/23/2021   CO2 23 10/23/2021    Lab Results  Component Value Date   ALT 23 10/23/2021   AST 21 10/23/2021   ALKPHOS 104 09/25/2020   BILITOT 0.4 10/23/2021     Assessment & Plan:   Problem List Items Addressed This Visit       Unprioritized   Asymptomatic HIV infection, with no history of HIV-related illness ([place]) - Primary    Very well controlled on once daily Biktarvy. No concerns with access or adherence to medication. They are tolerating the medication well without side effects. No drug interactions identified. Pertinent lab tests ordered today.   Given some concerns recently over her kidney function we will go ahead and proactively switch her to once daily Dovato.  We spent time discussing this today.  I provided her with a 2-week sample supply so she can go ahead and switch and will send to her preferred pharmacy for future fills.  Counseled on how to take the new medication (exactly like she currently does with Biktarvy in the mornings with her other medicines). We will have her back in 6 weeks so we can repeat her viral load to make sure that it is under good control given the med switch.  I am very confident she will have a good result and continued to sustain long-lasting remission. No changes to insurance coverage.  No co-pay associated with Dovato per our investigation today. No dental needs  today.  No concern over anxious/depressed mood.  Sexual health and family planning discussed - no needs today. Vaccines updated today - see health maintenance section.   Return in about 6 months (around 11/28/2022).        Relevant Medications   dolutegravir-lamiVUDine (DOVATO) 50-300 MG tablet   Other Relevant Orders   HIV 1 RNA quant-no reflex-bld   Lipid panel   COMPLETE METABOLIC PANEL WITH GFR   HIV 1 RNA quant-no reflex-bld   T-helper cells (CD4) count (not at [facility])   CKD (chronic kidney disease) stage 3, GFR 30-59 ml/min (HCC)    We switched her treatment regimen today to once daily Dovato.  This will help ensure there is no influence of further TAF and will not confound any further decisions necessary for her kidneys.      Healthcare maintenance    Counseling regarding Shingrix and RSV vaccines discussed today.  She will look to schedule both of those at local pharmacy.  As far as her Pap smears.  It is currently recommended for women living with HIV to proceed with lifelong screenings at interval no more than every 3 years.  I am happy to help her with this screening service or if she prefer to follow-up with her PCP she can do that as well.      Muscle cramps at night    Reviewed CMP recently and K looks OK. No signs of claudication or swelling. Doubt vascular component.  Will trial magnesium citrate at night 250 mg and see if that helps. Could also try magnesium oil/lotion to legs at night to see if  that is helpful.      Return in about 6 months (around 11/28/2022).    Janene Madeira, MSN, NP-C [facility] for Infectious Disease [email or web address] Pager: [phone] Office: [place]: [phone]    05/30/22  11:50 AM

## 2022-05-30 NOTE — Assessment & Plan Note (Signed)
We switched her treatment regimen today to once daily Dovato.  This will help ensure there is no influence of further TAF and will not confound any further decisions necessary for her kidneys.

## 2022-05-30 NOTE — Assessment & Plan Note (Signed)
Very well controlled on once daily Biktarvy. No concerns with access or adherence to medication. They are tolerating the medication well without side effects. No drug interactions identified. Pertinent lab tests ordered today.   Given some concerns recently over her kidney function we will go ahead and proactively switch her to once daily Dovato.  We spent time discussing this today.  I provided her with a 2-week sample supply so she can go ahead and switch and will send to her preferred pharmacy for future fills.  Counseled on how to take the new medication (exactly like she currently does with Biktarvy in the mornings with her other medicines). We will have her back in 6 weeks so we can repeat her viral load to make sure that it is under good control given the med switch.  I am very confident she will have a good result and continued to sustain long-lasting remission. No changes to insurance coverage.  No co-pay associated with Dovato per our investigation today. No dental needs today.  No concern over anxious/depressed mood.  Sexual health and family planning discussed - no needs today. Vaccines updated today - see health maintenance section.   Return in about 6 months (around 11/28/2022).

## 2022-05-30 NOTE — Patient Instructions (Addendum)
Would like to switch you from [place] to [place]   This will work just as well as the [employer].   As far as Pap smear recommendations, I would recommend you have this test repeated every 3 years.  I am happy to help you with that, or you can see if your primary care provider can help you with a follow-up study.   Magnesium citrate over the counter 250 mg capsule once a night. Nature Made bottle (seems cheaper per pill with volume). See if that helps with some cramps at night.  Magnesium lotion or bath flakes to help as well.   6 weeks a follow up lab test then we can see you again in 6 months.  Make sure the pharmacy has your copay card on file after it is activated.

## 2022-06-02 LAB — HIV-1 RNA QUANT-NO REFLEX-BLD
HIV 1 RNA Quant: NOT DETECTED Copies/mL
HIV-1 RNA Quant, Log: NOT DETECTED Log cps/mL

## 2022-06-02 LAB — T-HELPER CELLS (CD4) COUNT (NOT AT ARMC)
Absolute CD4: 919 cells/uL (ref 490–1740)
CD4 T Helper %: 54 % (ref 30–61)
Total lymphocyte count: 1715 cells/uL (ref 850–3900)

## 2022-06-02 LAB — COMPLETE METABOLIC PANEL WITH GFR
AG Ratio: 1.4 (calc) (ref 1.0–2.5)
ALT: 25 U/L (ref 6–29)
AST: 20 U/L (ref 10–35)
Albumin: 4.3 g/dL (ref 3.6–5.1)
Alkaline phosphatase (APISO): 103 U/L (ref 37–153)
BUN: 15 mg/dL (ref 7–25)
CO2: 26 mmol/L (ref 20–32)
Calcium: 9.4 mg/dL (ref 8.6–10.4)
Chloride: 109 mmol/L (ref 98–110)
Creat: 1.02 mg/dL (ref 0.50–1.05)
Globulin: 3 g/dL (calc) (ref 1.9–3.7)
Glucose, Bld: 88 mg/dL (ref 65–99)
Potassium: 3.9 mmol/L (ref 3.5–5.3)
Sodium: 141 mmol/L (ref 135–146)
Total Bilirubin: 0.4 mg/dL (ref 0.2–1.2)
Total Protein: 7.3 g/dL (ref 6.1–8.1)
eGFR: 61 mL/min/{1.73_m2} (ref >=60)

## 2022-06-02 LAB — LIPID PANEL
Cholesterol: 151 mg/dL (ref <200)
HDL: 61 mg/dL (ref >=50)
LDL Cholesterol (Calc): 64 mg/dL (calc)
Non-HDL Cholesterol (Calc): 90 mg/dL (calc) (ref <130)
Total CHOL/HDL Ratio: 2.5 (calc) (ref <5.0)
Triglycerides: 181 mg/dL — ABNORMAL HIGH (ref <150)

## 2022-06-06 ENCOUNTER — Other Ambulatory Visit: Payer: Self-pay | Admitting: Physician Assistant

## 2022-06-06 ENCOUNTER — Ambulatory Visit: Payer: Self-pay | Admitting: *Deleted

## 2022-06-06 DIAGNOSIS — R059 Cough, unspecified: Secondary | ICD-10-CM | POA: Diagnosis not present

## 2022-06-06 DIAGNOSIS — J00 Acute nasopharyngitis [common cold]: Secondary | ICD-10-CM | POA: Diagnosis not present

## 2022-06-06 NOTE — Telephone Encounter (Signed)
  Chief Complaint: chest pain/ discomfort Symptoms: radiates to back. Constant chest discomfort since last night. Difficulty breathing taking deep breath in, sneezing, cough. No dizziness  Frequency: last night  Pertinent Negatives: Patient denies pressure of severe pain in chest. No difficulty breathing at rest. No fever Disposition: [x] ED /[] Urgent Care (no appt availability in office) / [] Appointment(In office/virtual)/ []  [place] [facility]/ [] Home Care/ [] Refused Recommended Disposition /[] [place] [facility]/ []  Follow-up with PCP Additional Notes:   Recommended UC/ED due to sx. Patient reports she has recently been around sister with cold sx. Unknown covid status .    Reason for Disposition  Taking a deep breath makes pain worse  Answer Assessment - Initial Assessment Questions 1. LOCATION: "Where does it hurt?"       Center of chest  2. RADIATION: "Does the pain go anywhere else?" (e.g., into neck, jaw, arms, back)     To back  3. ONSET: "When did the chest pain begin?" (Minutes, hours or days)      Last night  4. PATTERN: "Does the pain come and go, or has it been constant since it started?"  "Does it get worse with exertion?"      Constant  5. DURATION: "How long does it last" (e.g., seconds, minutes, hours)     Since last night  6. SEVERITY: "How bad is the pain?"  (e.g., Scale 1-10; mild, moderate, or severe)    - MILD (1-3): doesn't interfere with normal activities     - MODERATE (4-7): interferes with normal activities or awakens from sleep    - SEVERE (8-10): excruciating pain, unable to do any normal activities       Not sleeping last night  7. CARDIAC RISK FACTORS: "Do you have any history of heart problems or risk factors for heart disease?" (e.g., angina, prior heart attack; diabetes, high blood pressure, high cholesterol, smoker, or strong family history of heart disease)     Hx HTN  diabetes 8. PULMONARY RISK FACTORS: "Do you have any history of lung  disease?"  (e.g., blood clots in lung, asthma, emphysema, birth control pills)     Na 9. CAUSE: "What do you think is causing the chest pain?"     Not sure maybe getting "sick" from sister with cold sx. 10. OTHER SYMPTOMS: "Do you have any other symptoms?" (e.g., dizziness, nausea, vomiting, sweating, fever, difficulty breathing, cough)       Chest pain/ discomfort , deep breath difficulty breathing, sneezing , cough   11. PREGNANCY: "Is there any chance you are pregnant?" "When was your last menstrual period?"       na  Protocols used: Chest Pain-A-AH

## 2022-06-06 NOTE — Telephone Encounter (Signed)
Future visit in 4 mnths . Requested Prescriptions  Pending Prescriptions Disp Refills   amLODipine (NORVASC) 5 MG tablet [Pharmacy Med Name: AMLODIPINE BESYLATE TABS 5MG] 90 tablet 2    Sig: TAKE 1 TABLET DAILY     Cardiovascular: Calcium Channel Blockers 2 Passed - 06/06/2022 12:35 AM      Passed - Last BP in normal range    BP Readings from Last 1 Encounters:  05/30/22 107/74         Passed - Last Heart Rate in normal range    Pulse Readings from Last 1 Encounters:  05/30/22 88         Passed - Valid encounter within last 6 months    Recent Outpatient Visits           1 month ago Type 2 diabetes mellitus with hyperglycemia, without long-term current use of insulin ([place])   [facility] [place], Coralie Keens, NP   2 months ago Need for influenza vaccination   [facility] [place], Coralie Keens, NP   7 months ago Encounter for general adult medical examination with abnormal findings   [facility] [place], Coralie Keens, NP   11 months ago Type 2 diabetes mellitus with hyperglycemia, with long-term current use of insulin [facility]   [facility] [place], Coralie Keens, NP   1 year ago Medication side effect, initial encounter   [facility] [place], Coralie Keens, NP       Future Appointments             In 4 months Baity, Coralie Keens, NP [facility], [place]   In 5 months Doren Custard, Melton Krebs, NP [facility], [facility]

## 2022-06-10 ENCOUNTER — Other Ambulatory Visit: Payer: Self-pay | Admitting: Pharmacist

## 2022-06-10 DIAGNOSIS — Z21 Asymptomatic human immunodeficiency virus [HIV] infection status: Secondary | ICD-10-CM

## 2022-06-10 MED ORDER — DOVATO 50-300 MG PO TABS: 1.0000 | ORAL_TABLET | Freq: Every day | ORAL | 0 refills | Status: AC

## 2022-06-10 NOTE — Progress Notes (Signed)
Medication Samples have been provided to the patient.  Drug name: Dovato        Strength: 50/300 mg         Qty: 1 bottle (14 tablets)   LOT: [id number]   Exp.Date: 05/2023  Dosing instructions: Take one tablet by mouth once daily  The patient has been instructed regarding the correct time, dose, and frequency of taking this medication, including desired effects and most common side effects.   Britley Gashi L. Eber Hong, PharmD, BCIDP, AAHIVP, CPP Clinical Pharmacist Practitioner Infectious Diseases [facility] 06/25/2020, 10:07 AM

## 2022-06-23 ENCOUNTER — Other Ambulatory Visit: Payer: Self-pay | Admitting: Internal Medicine

## 2022-06-23 ENCOUNTER — Encounter: Payer: Self-pay | Admitting: Internal Medicine

## 2022-06-23 ENCOUNTER — Telehealth: Payer: Self-pay | Admitting: Internal Medicine

## 2022-06-24 ENCOUNTER — Other Ambulatory Visit: Payer: Self-pay | Admitting: Internal Medicine

## 2022-06-24 MED ORDER — GLIPIZIDE 10 MG PO TABS: 10.0000 mg | ORAL_TABLET | Freq: Two times a day (BID) | ORAL | 0 refills | Status: DC

## 2022-06-24 MED ORDER — BD PEN NEEDLE MINI U/F 31G X 5 MM MISC: 0 refills | Status: DC

## 2022-06-24 MED ORDER — LEVEMIR FLEXTOUCH 100 UNIT/ML ~~LOC~~ SOPN: PEN_INJECTOR | SUBCUTANEOUS | 0 refills | Status: DC

## 2022-06-24 NOTE — Telephone Encounter (Signed)
Patient is calling in to request a short supply of   - Insulin Pen Needle (B-D UF III MINI PEN NEEDLES) 31G X 5 MM MISC and glipiZIDE (GLUCOTROL) 10 MG tablet to be sent to   [facility], [place] [address] AT [place] Phone: [phone]  Fax: [phone]     Patient would like for the remaining supply of glipiZIDE (GLUCOTROL) 10 MG tablet  to be sent to [facility] Delivery method: Mail.   Per patient she ran out of  glipiZIDE (GLUCOTROL) 10 MG tablet over the weekend.

## 2022-06-24 NOTE — Addendum Note (Signed)
Addended by: Ashley Royalty E on: 06/24/2022 02:24 PM   Modules accepted: Orders

## 2022-06-24 NOTE — Telephone Encounter (Signed)
Duplicate request- Rx 08/25/15 55m Requested Prescriptions  Pending Prescriptions Disp Refills   insulin detemir (LEVEMIR FLEXPEN) 100 UNIT/ML FlexPen [Pharmacy Med Name: LEVEMIR FLEXPEN 3ML 5'S 100U/ML] 15 mL 2    Sig: INJECT 10 UNITS UNDER THE SKIN ONCE DAILY     Endocrinology:  Diabetes - Insulins Failed - 06/23/2022  7:03 PM      Failed - HBA1C is between 0 and 7.9 and within 180 days    Hemoglobin A1C  Date Value Ref Range Status  06/22/2014 7.5 (H) 4.2 - 6.3 % Final    Comment:    The American Diabetes Association recommends that a primary goal of therapy should be <7% and that physicians should reevaluate the treatment regimen in patients with HbA1c values consistently >8%.    Hgb A1c MFr Bld  Date Value Ref Range Status  10/23/2021 6.8 (H) <5.7 % of total Hgb Final    Comment:    For someone without known diabetes, a hemoglobin A1c value of 6.5% or greater indicates that they may have  diabetes and this should be confirmed with a follow-up  test. . For someone with known diabetes, a value <7% indicates  that their diabetes is well controlled and a value  greater than or equal to 7% indicates suboptimal  control. A1c targets should be individualized based on  duration of diabetes, age, comorbid conditions, and  other considerations. . Currently, no consensus exists regarding use of hemoglobin A1c for diagnosis of diabetes for children. .Renella Cunas- Valid encounter within last 6 months    Recent Outpatient Visits           2 months ago Type 2 diabetes mellitus with hyperglycemia, without long-term current use of insulin ([facility]   [facility]   3 months ago Need for influenza vaccination   [facility]   8 months ago Encounter for general adult medical examination with abnormal findings   [facility]   11 months ago Type 2 diabetes mellitus with  hyperglycemia, with long-term current use of insulin ([facility]   [facility]   1 year ago Medication side effect, initial encounter   [facility] [facility]       Future Appointments             In 4 months Baity, [facility] Keens NP [facility] [place]  In 5 months DDoren Custard SMelton Krebs NP [facility]

## 2022-06-24 NOTE — Telephone Encounter (Signed)
RX for Pen Needles was sent to [facility] and Glipizide has been sent to [facility].    Thanks,   -Mickel Baas

## 2022-06-26 ENCOUNTER — Encounter: Payer: Self-pay | Admitting: Internal Medicine

## 2022-07-03 ENCOUNTER — Ambulatory Visit: Payer: Self-pay

## 2022-07-03 NOTE — Telephone Encounter (Signed)
Patient called in with nausea and diarrhea.   Chief Complaint: Flu exposure from work. Cough, nausea, diarrhea,body aches. No availability for visit. Asking what she can take OTC with all the medications she is on. Symptoms: Yesterday Frequency: Yesterday Pertinent Negatives: Patient denies fever Disposition: [] ED /[] Urgent Care (no appt availability in office) / [] Appointment(In office/virtual)/ []  [place] [facility]/ [] Home Care/ [] Refused Recommended Disposition /[] [place] [facility]/ [x]  Follow-up with PCP Additional Notes: Please advise pt.  Answer Assessment - Initial Assessment Questions 1. TYPE of EXPOSURE: "How were you exposed?" (e.g., close contact, not a close contact)     Work 2. DATE of EXPOSURE: "When did the exposure occur?" (e.g., hour, days, weeks)     Yesterday 3. PREGNANCY: "Is there any chance you are pregnant?" "When was your last menstrual period?"     No 4. HIGH RISK for COMPLICATIONS: "Do you have any heart or lung problems?" "Do you have a weakened immune system?" (e.g., CHF, COPD, asthma, HIV positive, chemotherapy, renal failure, diabetes mellitus, sickle cell anemia)     Yes 5. SYMPTOMS: "Do you have any symptoms?" (e.g., cough, fever, sore throat, difficulty breathing).     Nausea,diarrhea, cough,runny nose, body aches  Protocols used: Influenza (Flu) [place]

## 2022-07-04 NOTE — Telephone Encounter (Signed)
She needs to be seen or if no appointments available, would recommend virtual visit with other [facility] provider.

## 2022-07-11 ENCOUNTER — Other Ambulatory Visit: Payer: Self-pay

## 2022-07-11 ENCOUNTER — Other Ambulatory Visit: Payer: BC Managed Care – PPO

## 2022-07-11 DIAGNOSIS — Z21 Asymptomatic human immunodeficiency virus [HIV] infection status: Secondary | ICD-10-CM

## 2022-07-14 LAB — HIV-1 RNA QUANT-NO REFLEX-BLD
HIV 1 RNA Quant: NOT DETECTED Copies/mL
HIV-1 RNA Quant, Log: NOT DETECTED Log cps/mL

## 2022-07-25 ENCOUNTER — Other Ambulatory Visit (HOSPITAL_COMMUNITY): Payer: Self-pay

## 2022-07-29 ENCOUNTER — Other Ambulatory Visit: Payer: Self-pay

## 2022-07-29 ENCOUNTER — Other Ambulatory Visit (HOSPITAL_COMMUNITY): Payer: Self-pay

## 2022-07-29 DIAGNOSIS — Z21 Asymptomatic human immunodeficiency virus [HIV] infection status: Secondary | ICD-10-CM

## 2022-07-29 MED ORDER — DOVATO 50-300 MG PO TABS
1.0000 | ORAL_TABLET | Freq: Every day | ORAL | 4 refills | Status: DC
  Filled 2022-07-29 (×2): qty 30, 30d supply, fill #0
  Filled 2022-08-15: qty 30, 30d supply, fill #1
  Filled 2022-09-22: qty 30, 30d supply, fill #2
  Filled 2022-10-17: qty 30, 30d supply, fill #3

## 2022-07-29 NOTE — Telephone Encounter (Signed)
Hey Ce Ce,  Can you send her Dovato script to [facility] , Idk why it cost her at the other location. I called the other location and thye told me it was a cost but I told them there was not a cost .

## 2022-08-01 ENCOUNTER — Ambulatory Visit (INDEPENDENT_AMBULATORY_CARE_PROVIDER_SITE_OTHER): Payer: BC Managed Care – PPO | Admitting: Family Medicine

## 2022-08-01 ENCOUNTER — Encounter: Payer: Self-pay | Admitting: Family Medicine

## 2022-08-01 VITALS — BP 114/70 | Ht 62.0 in | Wt 151.8 lb

## 2022-08-01 DIAGNOSIS — Z9109 Other allergy status, other than to drugs and biological substances: Secondary | ICD-10-CM

## 2022-08-01 DIAGNOSIS — J0110 Acute frontal sinusitis, unspecified: Secondary | ICD-10-CM

## 2022-08-01 DIAGNOSIS — J069 Acute upper respiratory infection, unspecified: Secondary | ICD-10-CM | POA: Diagnosis not present

## 2022-08-01 DIAGNOSIS — J4531 Mild persistent asthma with (acute) exacerbation: Secondary | ICD-10-CM | POA: Diagnosis not present

## 2022-08-01 LAB — POCT INFLUENZA A/B
Influenza A, POC: NEGATIVE
Influenza B, POC: NEGATIVE

## 2022-08-01 LAB — POC COVID19 BINAXNOW: SARS Coronavirus 2 Ag: NEGATIVE

## 2022-08-01 MED ORDER — LEVOFLOXACIN 500 MG PO TABS: 500.0000 mg | ORAL_TABLET | Freq: Every day | ORAL | 0 refills | Status: DC

## 2022-08-01 MED ORDER — GUAIFENESIN-CODEINE 100-10 MG/5ML PO SYRP: 5.0000 mL | ORAL_SOLUTION | Freq: Four times a day (QID) | ORAL | 0 refills | Status: DC | PRN

## 2022-08-01 MED ORDER — MONTELUKAST SODIUM 10 MG PO TABS: 10.0000 mg | ORAL_TABLET | Freq: Every day | ORAL | 3 refills | Status: AC

## 2022-08-01 MED ORDER — AZELASTINE HCL 0.1 % NA SOLN: 2.0000 | Freq: Two times a day (BID) | NASAL | 12 refills | Status: AC

## 2022-08-01 MED ORDER — ALBUTEROL SULFATE HFA 108 (90 BASE) MCG/ACT IN AERS: 2.0000 | INHALATION_SPRAY | RESPIRATORY_TRACT | 2 refills | Status: AC | PRN

## 2022-08-01 MED ORDER — PREDNISONE 20 MG PO TABS: ORAL_TABLET | ORAL | 0 refills | Status: DC

## 2022-08-01 NOTE — Progress Notes (Signed)
Subjective:    Patient ID: [name], female    DOB: February 13, 1956, 67 y.o.   MRN: [id number]  [name] is a 67 y.o. female presenting on 08/01/2022 for Cough, Nasal Congestion, and Headache  Patient presents for a same day appointment.  PCP Webb Silversmith, FNP   HPI  Sinusitis  Reports symptoms started 2-3 weeks ago with cough and congestion without fever but had chills. It did gradually improve with rest without medication and then returned symptoms about 9 days ago with similar coughing non productive, sneezing, tightness with breathing wheezing. Upper sinus and lower chest congestion. - She has history of seasonal allergies.  She asks for COVID Flu Testing today and note for return to work  [place] She has asymptomatic controlled HIV.  UTD on Flu Vaccine.  Denies fever chills nausea vomiting diarrhea     08/01/2022    9:23 AM 05/30/2022    9:06 AM 10/31/2021    1:33 PM  Depression screen PHQ 2/9  Decreased Interest 0 0 0  Down, Depressed, Hopeless 0 0 1  PHQ - 2 Score 0 0 1  Altered sleeping 3  1  Tired, decreased energy 0  1  Change in appetite 0  2  Feeling bad or failure about yourself  0  0  Trouble concentrating 0  0  Moving slowly or fidgety/restless 0  0  Suicidal thoughts 0  0  PHQ-9 Score 3  5  Difficult doing work/chores Not difficult at all  Not difficult at all    Social History   Tobacco Use   Smoking status: Never   Smokeless tobacco: Never  Vaping Use   Vaping Use: Never used  Substance Use Topics   Alcohol use: Not Currently    Alcohol/week: 0.0 standard drinks of alcohol   Drug use: No    Review of Systems Per HPI unless specifically indicated above     Objective:    BP 114/70   Ht 5\' 2"  (1.575 m)   Wt 151 lb 12.8 oz (68.9 kg)   BMI 27.76 kg/m   Wt Readings from Last 3 Encounters:  08/01/22 151 lb 12.8 oz (68.9 kg)  05/30/22 152 lb (68.9 kg)  04/18/22 150 lb (68 kg)    Physical Exam Vitals and nursing note  reviewed.  Constitutional:      General: She is not in acute distress.    Appearance: Normal appearance. She is well-developed. She is not diaphoretic.     Comments: Well-appearing, comfortable, cooperative  HENT:     Head: Normocephalic and atraumatic.  Eyes:     General:        Right eye: No discharge.        Left eye: No discharge.     Conjunctiva/sclera: Conjunctivae normal.  Cardiovascular:     Rate and Rhythm: Normal rate.  Pulmonary:     Effort: Pulmonary effort is normal.     Breath sounds: Wheezing present.     Comments: cough Skin:    General: Skin is warm and dry.     Findings: No erythema or rash.  Neurological:     Mental Status: She is alert and oriented to person, place, and time.  Psychiatric:        Mood and Affect: Mood normal.        Behavior: Behavior normal.        Thought Content: Thought content normal.     Comments: Well groomed, good eye contact, normal speech and thoughts  Results for orders placed or performed in visit on 08/01/22  POC COVID-19  Result Value Ref Range   SARS Coronavirus 2 Ag Negative Negative  POCT Influenza A/B  Result Value Ref Range   Influenza A, POC Negative Negative   Influenza B, POC Negative Negative      Assessment & Plan:   Problem List Items Addressed This Visit   None Visit Diagnoses     Acute non-recurrent frontal sinusitis    -  Primary   Relevant Medications   azelastine (ASTELIN) 0.1 % nasal spray   predniSONE (DELTASONE) 20 MG tablet   levofloxacin (LEVAQUIN) 500 MG tablet   guaiFENesin-codeine (ROBITUSSIN AC) 100-10 MG/5ML syrup   Environmental allergies       Relevant Medications   azelastine (ASTELIN) 0.1 % nasal spray   montelukast (SINGULAIR) 10 MG tablet   Mild persistent asthma with exacerbation       Relevant Medications   montelukast (SINGULAIR) 10 MG tablet   albuterol (VENTOLIN HFA) 108 (90 Base) MCG/ACT inhaler   predniSONE (DELTASONE) 20 MG tablet   Upper respiratory tract  infection, unspecified type       Relevant Orders   POC COVID-19   POCT Influenza A/B        Likely viral URI > now sinusitis infection Also asthma seems flared up with wheezing coughing, w bronchospasm Hx mild persistent asthma vs intermittent  Start taking Levaquin antibiotic 500mg  daily x 7 days (multiple other ANTIBIOTICS Allergy) Caution with high impact intensity activity avoid risk tendon injury  Take Prednisone taper 7 day  Take new allergy pill Singulair 10mg  nightly - caution mood side effect risks  Take new allergy nasal spray 2 sprays each side twice a day Azelastine  Rx Albuterol rescue inhaler PRN  Rx Cough syrup w codeine  Additionally COVID / Flu testing rapid today, both negative. Documented. She can return to work Monday.  Defer X-ray, given exam and history. Would consider as next option if not improving.  Meds ordered this encounter  Medications   azelastine (ASTELIN) 0.1 % nasal spray    Sig: Place 2 sprays into both nostrils 2 (two) times daily. Use in each nostril as directed    Dispense:  30 mL    Refill:  12   montelukast (SINGULAIR) 10 MG tablet    Sig: Take 1 tablet (10 mg total) by mouth at bedtime.    Dispense:  90 tablet    Refill:  3   albuterol (VENTOLIN HFA) 108 (90 Base) MCG/ACT inhaler    Sig: Inhale 2 puffs into the lungs every 4 (four) hours as needed for wheezing or shortness of breath.    Dispense:  8 g    Refill:  2   predniSONE (DELTASONE) 20 MG tablet    Sig: Take daily with food. Start with 60mg  (3 pills) x 2 days, then reduce to 40mg  (2 pills) x 2 days, then 20mg  (1 pill) x 3 days    Dispense:  13 tablet    Refill:  0   levofloxacin (LEVAQUIN) 500 MG tablet    Sig: Take 1 tablet (500 mg total) by mouth daily. For 7 days    Dispense:  7 tablet    Refill:  0   guaiFENesin-codeine (ROBITUSSIN AC) 100-10 MG/5ML syrup    Sig: Take 5-10 mLs by mouth 4 (four) times daily as needed for cough.    Dispense:  180 mL    Refill:  0  Follow up plan: Return if symptoms worsen or fail to improve.     Nobie Putnam, [facility] 08/01/2022, 9:43 AM

## 2022-08-01 NOTE — Patient Instructions (Addendum)
Thank you for coming to the office today.  Likely viral URI > now sinusitis infection  Also asthma seems flared up with wheezing coughing  Start taking Levaquin antibiotic 500mg  daily x 7 days Caution with high impact intensity activity  Take Prednisone taper  Take new allergy pill Singulair 10mg  nightly Take new allergy nasal spray 2 sprays each side twice a day Azelastine  Albuterol rescue inhaler  Cough syrup w codeine  Testing today  Please schedule a Follow-up Appointment to: No follow-ups on file.  If you have any other questions or concerns, please feel free to call the office or send a message through [place]. You may also schedule an earlier appointment if necessary.  Additionally, you may be receiving a survey about your experience at our office within a few days to 1 week by e-mail or mail. We value your feedback.  Nobie Putnam, DO [place]

## 2022-08-03 ENCOUNTER — Other Ambulatory Visit: Payer: Self-pay | Admitting: Internal Medicine

## 2022-08-04 NOTE — Telephone Encounter (Signed)
Requested Prescriptions  Pending Prescriptions Disp Refills   Insulin Pen Needle (B-D UF III MINI PEN NEEDLES) 31G X 5 MM MISC [Pharmacy Med Name: BD PEN NED UF MINI 5MM 31G3/16] 100 each 0    Sig: USE TO INJECT INSULIN SIX TIMES DAILY     Endocrinology: Diabetes - Testing Supplies Passed - 08/03/2022  1:05 PM      Passed - Valid encounter within last 12 months    Recent Outpatient Visits           3 days ago Acute non-recurrent frontal sinusitis   [facility] [place], Devonne Doughty, DO   3 months ago Type 2 diabetes mellitus with hyperglycemia, without long-term current use of insulin [facility])   [facility] [place], Coralie Keens, NP   4 months ago Need for influenza vaccination   [facility] [place], Coralie Keens, NP   9 months ago Encounter for general adult medical examination with abnormal findings   [facility] [place], [place] W, NP   1 year ago Type 2 diabetes mellitus with hyperglycemia, with long-term current use of insulin [facility])   [facility] [place], Coralie Keens, NP       Future Appointments             In 2 months Baity, Coralie Keens, NP [facility], [place]   In 3 months Doren Custard, Melton Krebs, NP [facility], [facility]

## 2022-08-11 ENCOUNTER — Ambulatory Visit: Payer: Self-pay | Admitting: *Deleted

## 2022-08-11 ENCOUNTER — Ambulatory Visit (INDEPENDENT_AMBULATORY_CARE_PROVIDER_SITE_OTHER): Payer: BC Managed Care – PPO | Admitting: Internal Medicine

## 2022-08-11 ENCOUNTER — Ambulatory Visit
Admission: RE | Admit: 2022-08-11 | Discharge: 2022-08-11 | Disposition: A | Discharge status: Home / Self Care | Payer: BC Managed Care – PPO | Attending: Internal Medicine | Admitting: Internal Medicine

## 2022-08-11 ENCOUNTER — Ambulatory Visit: Payer: Self-pay

## 2022-08-11 ENCOUNTER — Ambulatory Visit
Admission: RE | Admit: 2022-08-11 | Discharge: 2022-08-11 | Disposition: A | Discharge status: Home / Self Care | Payer: BC Managed Care – PPO | Source: Ambulatory Visit | Attending: Internal Medicine | Admitting: Internal Medicine

## 2022-08-11 ENCOUNTER — Encounter: Payer: Self-pay | Admitting: Internal Medicine

## 2022-08-11 VITALS — BP 138/84 | HR 74 | Ht 62.0 in | Wt 151.6 lb

## 2022-08-11 DIAGNOSIS — R519 Headache, unspecified: Secondary | ICD-10-CM

## 2022-08-11 DIAGNOSIS — R059 Cough, unspecified: Secondary | ICD-10-CM | POA: Diagnosis not present

## 2022-08-11 DIAGNOSIS — R0602 Shortness of breath: Secondary | ICD-10-CM

## 2022-08-11 DIAGNOSIS — H9201 Otalgia, right ear: Secondary | ICD-10-CM

## 2022-08-11 DIAGNOSIS — Z21 Asymptomatic human immunodeficiency virus [HIV] infection status: Secondary | ICD-10-CM | POA: Insufficient documentation

## 2022-08-11 DIAGNOSIS — J3489 Other specified disorders of nose and nasal sinuses: Secondary | ICD-10-CM | POA: Insufficient documentation

## 2022-08-11 DIAGNOSIS — R051 Acute cough: Secondary | ICD-10-CM | POA: Diagnosis not present

## 2022-08-11 NOTE — Telephone Encounter (Signed)
  Chief Complaint: Coughing nearly constantly Symptoms: above Frequency: 2 weeks Pertinent Negatives: Patient denies Fever Disposition: [] ED /[] Urgent Care (no appt availability in office) / [x] Appointment(In office/virtual)/ []  [place] [facility]/ [] Home Care/ [] Refused Recommended Disposition /[]  Mobile Bus/ []  Follow-up with PCP Additional Notes: Pt has been ill for about 2 weeks. Cough has not resolved. PT is coughing nearly constantly. Pt has HA, , BA and sides are sore from coughing.    Reason for Disposition  Continuous (nonstop) coughing interferes with work, school, or sleeping  Answer Assessment - Initial Assessment Questions 1. RESPIRATORY STATUS: "Describe your breathing?" (e.g., wheezing, shortness of breath, unable to speak, severe coughing)      Coughing 2. ONSET: "When did this breathing problem begin?"      2 weeks 3. PATTERN "Does the difficult breathing come and go, or has it been constant since it started?"      Constant 4. SEVERITY: "How bad is your breathing?" (e.g., mild, moderate, severe)    - MILD: No SOB at rest, mild SOB with walking, speaks normally in sentences, can lie down, no retractions, pulse < 100.    - MODERATE: SOB at rest, SOB with minimal exertion and prefers to sit, cannot lie down flat, speaks in phrases, mild retractions, audible wheezing, pulse 100-120.    - SEVERE: Very SOB at rest, speaks in single words, struggling to breathe, sitting hunched forward, retractions, pulse > 120      moderate 5. RECURRENT SYMPTOM: "Have you had difficulty breathing before?" If Yes, ask: "When was the last time?" and "What happened that time?"      no 6. CARDIAC HISTORY: "Do you have any history of heart disease?" (e.g., heart attack, angina, bypass surgery, angioplasty)      no 7. LUNG HISTORY: "Do you have any history of lung disease?"  (e.g., pulmonary embolus, asthma, emphysema)     asthma 8. CAUSE: "What do you think is causing the  breathing problem?"      Viral infection 9. OTHER SYMPTOMS: "Do you have any other symptoms? (e.g., dizziness, runny nose, cough, chest pain, fever)     BA, HA 10. O2 SATURATION MONITOR:  "Do you use an oxygen saturation monitor (pulse oximeter) at home?" If Yes, ask: "What is your reading (oxygen level) today?" "What is your usual oxygen saturation reading?" (e.g., 95%)        11. PREGNANCY: "Is there any chance you are pregnant?" "When was your last menstrual period?"        12. TRAVEL: "Have you traveled out of the country in the last month?" (e.g., travel history, exposures)  Protocols used: Breathing Difficulty-A-AH

## 2022-08-11 NOTE — Patient Instructions (Signed)
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
## 2022-08-11 NOTE — Telephone Encounter (Signed)
ears inflammed   Patient called in states was told her ears are inflammed at her appt today and wants to know if Dr Garnette Gunner is going to prescribe medicine.     Chief Complaint: Med Question Symptoms: NA Frequency: NA Pertinent Negatives: Patient denies NA Disposition: [] ED /[] Urgent Care (no appt availability in office) / [] Appointment(In office/virtual)/ []  [place] [facility]/ [] Home Care/ [] Refused Recommended Disposition /[] [place] [facility]/ [x]  Follow-up with PCP Additional Notes:  Pt seen today, had CXR. Pt questioning if med will be called in for her ears. Assured pt NT would route to practice for PCPs review and final disposition. Advised CXR pending, may be waiting for results  to assess further.. Please advise.  Reason for Disposition  [1] Caller has URGENT medicine question about med that PCP or specialist prescribed AND [2] triager unable to answer question  Answer Assessment - Initial Assessment Questions 1. NAME of MEDICINE: "What medicine(s) are you calling about?"     "For my ears" 2. QUESTION: "What is your question?" (e.g., double dose of medicine, side effect)     "Will med be called in?"  Protocols used: Medication Question Call-A-AH

## 2022-08-12 ENCOUNTER — Encounter: Payer: Self-pay | Admitting: Internal Medicine

## 2022-08-12 LAB — CBC
HCT: 34.1 % — ABNORMAL LOW (ref 35.0–45.0)
Hemoglobin: 11.8 g/dL (ref 11.7–15.5)
MCH: 31.6 pg (ref 27.0–33.0)
MCHC: 34.6 g/dL (ref 32.0–36.0)
MCV: 91.2 fL (ref 80.0–100.0)
MPV: 10 fL (ref 7.5–12.5)
Platelets: 180 10*3/uL (ref 140–400)
RBC: 3.74 10*6/uL — ABNORMAL LOW (ref 3.80–5.10)
RDW: 13.6 % (ref 11.0–15.0)
WBC: 6.1 10*3/uL (ref 3.8–10.8)

## 2022-08-12 LAB — BASIC METABOLIC PANEL WITH GFR
BUN: 11 mg/dL (ref 7–25)
CO2: 25 mmol/L (ref 20–32)
Calcium: 8.7 mg/dL (ref 8.6–10.4)
Chloride: 105 mmol/L (ref 98–110)
Creat: 1.01 mg/dL (ref 0.50–1.05)
Glucose, Bld: 73 mg/dL (ref 65–99)
Potassium: 3.9 mmol/L (ref 3.5–5.3)
Sodium: 139 mmol/L (ref 135–146)
eGFR: 62 mL/min/{1.73_m2} (ref >=60)

## 2022-08-12 NOTE — Telephone Encounter (Signed)
See My Chart Message    Thanks,   -Tashay Bozich  

## 2022-08-12 NOTE — Telephone Encounter (Signed)
She did not have an ear infection.  She had fluid behind her left TM.  She can either use Flonase or the Astelin that was prescribed at her previous appointment by Dr. Raliegh Ip.  Antibiotics are not indicated.

## 2022-08-15 ENCOUNTER — Other Ambulatory Visit (HOSPITAL_COMMUNITY): Payer: Self-pay

## 2022-08-18 ENCOUNTER — Other Ambulatory Visit: Payer: Self-pay | Admitting: Internal Medicine

## 2022-08-19 NOTE — Telephone Encounter (Signed)
Requested Prescriptions  Pending Prescriptions Disp Refills   KLOR-CON M20 20 MEQ tablet [Pharmacy Med Name: POTASSIUM CHLORIDE ER (DISP) TABS 20MEQ] 90 tablet 0    Sig: TAKE 1 TABLET DAILY     Endocrinology:  Minerals - Potassium Supplementation Passed - 08/18/2022  1:38 AM      Passed - K in normal range and within 360 days    Potassium  Date Value Ref Range Status  08/11/2022 3.9 3.5 - 5.3 mmol/L Final  06/23/2014 4.2 3.5 - 5.1 mmol/L Final         Passed - Cr in normal range and within 360 days    Creat  Date Value Ref Range Status  08/11/2022 1.01 0.50 - 1.05 mg/dL Final   Creatinine,U  Date Value Ref Range Status  09/25/2020 145.4 mg/dL Final   Creatinine, Urine  Date Value Ref Range Status  07/10/2021 102 20 - 275 mg/dL Final         Passed - Valid encounter within last 12 months    Recent Outpatient Visits           1 week ago Acute cough   [facility] [place], Coralie Keens, NP   2 weeks ago Acute non-recurrent frontal sinusitis   [facility] Olin Hauser, DO   4 months ago Type 2 diabetes mellitus with hyperglycemia, without long-term current use of insulin [facility])   [facility] [place], [place] W, NP   5 months ago Need for influenza vaccination   [facility] [place], Coralie Keens, NP   10 months ago Encounter for general adult medical examination with abnormal findings   [facility] [place], Coralie Keens, NP       Future Appointments             In 2 months Baity, Coralie Keens, NP [facility] [place]   In 3 months Doren Custard, Melton Krebs, NP [facility]             topiramate (TOPAMAX) 100 MG tablet [Pharmacy Med Name: TOPIRAMATE TABS 100MG ] 90 tablet 0    Sig: TAKE 1 TABLET AT BEDTIME AS NEEDED     Neurology: Anticonvulsants - topiramate & zonisamide Passed -  08/18/2022  1:38 AM      Passed - Cr in normal range and within 360 days    Creat  Date Value Ref Range Status  08/11/2022 1.01 0.50 - 1.05 mg/dL Final   Creatinine,U  Date Value Ref Range Status  09/25/2020 145.4 mg/dL Final   Creatinine, Urine  Date Value Ref Range Status  07/10/2021 102 20 - 275 mg/dL Final         Passed - CO2 in normal range and within 360 days    CO2  Date Value Ref Range Status  08/11/2022 25 20 - 32 mmol/L Final   Co2  Date Value Ref Range Status  06/23/2014 27 21 - 32 mmol/L Final         Passed - ALT in normal range and within 360 days    ALT  Date Value Ref Range Status  05/30/2022 25 6 - 29 U/L Final   SGPT (ALT)  Date Value Ref Range Status  06/22/2014 45 U/L Final    Comment:    14-63 NOTE: New Reference Range 01/31/14  Passed - AST in normal range and within 360 days    AST  Date Value Ref Range Status  05/30/2022 20 10 - 35 U/L Final   SGOT(AST)  Date Value Ref Range Status  06/22/2014 41 (H) 15 - 37 Unit/L Final         Passed - Completed PHQ-2 or PHQ-9 in the last 360 days      Passed - Valid encounter within last 12 months    Recent Outpatient Visits           1 week ago Acute cough   [facility] [place], [place] W, NP   2 weeks ago Acute non-recurrent frontal sinusitis   [facility] [place], Devonne Doughty, DO   4 months ago Type 2 diabetes mellitus with hyperglycemia, without long-term current use of insulin [facility])   [facility] [place], Coralie Keens, NP   5 months ago Need for influenza vaccination   [facility] [place], Coralie Keens, NP   10 months ago Encounter for general adult medical examination with abnormal findings   [facility] [place], Coralie Keens, NP       Future Appointments             In 2 months Baity, Coralie Keens, NP [facility], [place]   In 3  months Doren Custard, Melton Krebs, NP [facility]

## 2022-08-22 ENCOUNTER — Other Ambulatory Visit (HOSPITAL_COMMUNITY): Payer: Self-pay

## 2022-09-04 ENCOUNTER — Other Ambulatory Visit: Payer: Self-pay | Admitting: Internal Medicine

## 2022-09-04 NOTE — Telephone Encounter (Signed)
Requested Prescriptions  Pending Prescriptions Disp Refills   glipiZIDE (GLUCOTROL) 10 MG tablet [Pharmacy Med Name: GLIPIZIDE TABS 10MG] 180 tablet 0    Sig: TAKE 1 TABLET TWICE A DAY BEFORE MEALS     Endocrinology:  Diabetes - Sulfonylureas Failed - 09/04/2022 12:52 AM      Failed - HBA1C is between 0 and 7.9 and within 180 days    Hemoglobin A1C  Date Value Ref Range Status  06/22/2014 7.5 (H) 4.2 - 6.3 % Final    Comment:    The American Diabetes Association recommends that a primary goal of therapy should be <7% and that physicians should reevaluate the treatment regimen in patients with HbA1c values consistently >8%.    Hgb A1c MFr Bld  Date Value Ref Range Status  10/23/2021 6.8 (H) <5.7 % of total Hgb Final    Comment:    For someone without known diabetes, a hemoglobin A1c value of 6.5% or greater indicates that they may have  diabetes and this should be confirmed with a follow-up  test. . For someone with known diabetes, a value <7% indicates  that their diabetes is well controlled and a value  greater than or equal to 7% indicates suboptimal  control. A1c targets should be individualized based on  duration of diabetes, age, comorbid conditions, and  other considerations. . Currently, no consensus exists regarding use of hemoglobin A1c for diagnosis of diabetes for children. .          Passed - Cr in normal range and within 360 days    Creat  Date Value Ref Range Status  08/11/2022 1.01 0.50 - 1.05 mg/dL Final   Creatinine,U  Date Value Ref Range Status  09/25/2020 145.4 mg/dL Final   Creatinine, Urine  Date Value Ref Range Status  07/10/2021 102 20 - 275 mg/dL Final         Passed - Valid encounter within last 6 months    Recent Outpatient Visits           3 weeks ago Acute cough   [facility] [place], Coralie Keens, NP   1 month ago Acute non-recurrent frontal sinusitis   [facility] [place],  Devonne Doughty, DO   4 months ago Type 2 diabetes mellitus with hyperglycemia, without long-term current use of insulin [facility])   [facility] [place], Coralie Keens, NP   5 months ago Need for influenza vaccination   [facility] [place], Coralie Keens, NP   10 months ago Encounter for general adult medical examination with abnormal findings   [facility] [place], Coralie Keens, NP       Future Appointments             In 1 month Baity, Coralie Keens, NP [facility] [place]   In 2 months Doren Custard, Melton Krebs, NP [facility]

## 2022-09-16 ENCOUNTER — Other Ambulatory Visit (HOSPITAL_COMMUNITY): Payer: Self-pay

## 2022-09-18 ENCOUNTER — Other Ambulatory Visit (HOSPITAL_COMMUNITY): Payer: Self-pay

## 2022-09-22 ENCOUNTER — Ambulatory Visit: Payer: Self-pay

## 2022-09-22 ENCOUNTER — Encounter: Payer: Self-pay | Admitting: Internal Medicine

## 2022-09-22 ENCOUNTER — Other Ambulatory Visit (HOSPITAL_COMMUNITY): Payer: Self-pay

## 2022-09-22 ENCOUNTER — Ambulatory Visit (INDEPENDENT_AMBULATORY_CARE_PROVIDER_SITE_OTHER): Payer: BC Managed Care – PPO | Admitting: Internal Medicine

## 2022-09-22 VITALS — BP 110/66 | HR 86 | Temp 97.1°F | Wt 147.0 lb

## 2022-09-22 DIAGNOSIS — E782 Mixed hyperlipidemia: Secondary | ICD-10-CM

## 2022-09-22 DIAGNOSIS — Z9109 Other allergy status, other than to drugs and biological substances: Secondary | ICD-10-CM

## 2022-09-22 DIAGNOSIS — I69354 Hemiplegia and hemiparesis following cerebral infarction affecting left non-dominant side: Secondary | ICD-10-CM

## 2022-09-22 DIAGNOSIS — R42 Dizziness and giddiness: Secondary | ICD-10-CM | POA: Diagnosis not present

## 2022-09-22 DIAGNOSIS — E1165 Type 2 diabetes mellitus with hyperglycemia: Secondary | ICD-10-CM

## 2022-09-22 DIAGNOSIS — R269 Unspecified abnormalities of gait and mobility: Secondary | ICD-10-CM

## 2022-09-22 DIAGNOSIS — I10 Essential (primary) hypertension: Secondary | ICD-10-CM

## 2022-09-22 DIAGNOSIS — H538 Other visual disturbances: Secondary | ICD-10-CM

## 2022-09-22 LAB — POCT GLYCOSYLATED HEMOGLOBIN (HGB A1C): Hemoglobin A1C: 7.7 % — AB (ref 4.0–5.6)

## 2022-09-22 MED ORDER — LEVEMIR FLEXTOUCH 100 UNIT/ML ~~LOC~~ SOPN: 12.0000 [IU] | PEN_INJECTOR | Freq: Every day | SUBCUTANEOUS | 0 refills | Status: DC
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
## 2022-09-22 NOTE — Patient Instructions (Signed)
Allergic Rhinitis, Adult  Allergic rhinitis is a reaction to allergens. Allergens are things that can cause an allergic reaction. This condition affects the lining inside the nose (mucous membrane). There are two types of allergic rhinitis: Seasonal. This type is also called hay fever. It happens only during some times of the year. Perennial. This type can happen at any time of the year. This condition cannot be spread from person to person (is not contagious). It can be mild, bad, or very bad. It can develop at any age and may be outgrown. What are the causes? Pollen from grasses, trees, and weeds. Other causes can be: Dust mites. Smoke. Mold. Car fumes. The pee (urine), spit, or dander of pets. Dander is dead skin cells from a pet. What increases the risk? You are more likely to develop this condition if: You have allergies in your family. You have problems like allergies in your family. You may have: Swelling of parts of your eyes and eyelids. Asthma. This affects how you breathe. Long-term redness and swelling on your skin. Food allergies. What are the signs or symptoms? The main symptom of this condition is a runny or stuffy nose (nasal congestion). Other symptoms may include: Sneezing or coughing. Itching and tearing of your eyes. Mucus that drips down the back of your throat (postnasal drip). This may cause a sore throat. Trouble sleeping. Feeling tired. Headache. How is this treated? There is no cure for this condition. You should avoid things that you are allergic to. Treatment can help to relieve symptoms. This may include: Medicines that block allergy symptoms, such as anti-inflammatories or antihistamines. These may be given as a shot, nasal spray, or pill. Avoiding things you are allergic to. Medicines that give you some of what you are allergic to over time. This is called immunotherapy. It is done if other treatments do not help. You may get: Shots. Medicine under  your tongue. Stronger medicines, if other treatments do not help. Follow these instructions at home: Avoiding allergens Find out what things you are allergic to and avoid them. To do this, try these things: If you get allergies any time of year: Replace carpet with wood, tile, or vinyl flooring. Carpet can trap pet dander and dust. Do not smoke. Do not allow smoking in your home. Change your heating and air conditioning filters at least once a month. If you get allergies only some times of the year: Keep windows closed when you can. Plan things to do outside when pollen counts are lowest. Check pollen counts before you plan things to do outside. When you come indoors, change your clothes and shower before you sit on furniture or bedding. If you are allergic to a pet: Keep the pet out of your bedroom. Vacuum, sweep, and dust often. General instructions Take over-the-counter and prescription medicines only as told by your doctor. Drink enough fluid to keep your pee pale yellow. Where to find more information American Academy of Allergy, Asthma & Immunology: aaaai.org Contact a doctor if: You have a fever. You get a cough that does not go away. You make high-pitched whistling sounds when you breathe, most often when you breathe out (wheeze). Your symptoms slow you down. Your symptoms stop you from doing your normal things each day. Get help right away if: You are short of breath. This symptom may be an emergency. Get help right away. Call 911. Do not wait to see if the symptom will go away. Do not drive yourself to the   hospital. This information is not intended to replace advice given to you by your health care provider. Make sure you discuss any questions you have with your health care provider. Document Revised: 03/10/2022 Document Reviewed: 03/10/2022 Elsevier Patient Education  2023 Elsevier Inc.  

## 2022-09-22 NOTE — Telephone Encounter (Signed)
Chief Complaint: Dizziness - blood sugar issues Symptoms: Dizziness Frequency: every morning Pertinent Negatives: Patient denies  Disposition: '[]'$ ED /'[]'$ Urgent Care (no appt availability in office) / '[x]'$ Appointment(In office/virtual)/ '[]'$  [facility]/ '[]'$ Home Care/ '[]'$ Refused Recommended Disposition /'[]'$ [facility]/ '[]'$  Follow-up with PCP Additional Notes: Pt states that just about everyday she has dizziness in the morning. Her blood sugar this morning was 225 before eating. Pt had an incident a few weeks back of low BS where ems was called.  She was very dizzy and could not walk. PT was given candy by a co [employer] worker, [employer]. When EMS arrived BS was high.   PT was not taken to the hospital, but was sent home.     Reason for Disposition  Blood glucose 70-240 mg/dL (3.9 -13.3 mmol/L)  [1] MODERATE dizziness (e.g., interferes with normal activities) AND [2] has NOT been evaluated by doctor (or NP/PA) for this  (Exception: Dizziness caused by heat exposure, sudden standing, or poor fluid intake.)  Answer Assessment - Initial Assessment Questions 1. DESCRIPTION: "Describe your dizziness."     Lightheaded 2. LIGHTHEADED: "Do you feel lightheaded?" (e.g., somewhat faint, woozy, weak upon standing)     Lightheaded 3. VERTIGO: "Do you feel like either you or the room is spinning or tilting?" (i.e. vertigo)     Sometimes 4. SEVERITY: "How bad is it?"  "Do you feel like you are going to faint?" "Can you stand and walk?"   - MILD: Feels slightly dizzy, but walking normally.   - MODERATE: Feels unsteady when walking, but not falling; interferes with normal activities (e.g., school, work).   - SEVERE: Unable to walk without falling, or requires assistance to walk without falling; feels like passing out now.      moderate 5. ONSET:  "When did the dizziness begin?"     Every morning 6. AGGRAVATING FACTORS: "Does anything make it worse?" (e.g., standing, change in head position)     no 7. HEART  RATE: "Can you tell me your heart rate?" "How many beats in 15 seconds?"  (Note: not all patients can do this)        8. CAUSE: "What do you think is causing the dizziness?"     Unsure 9. RECURRENT SYMPTOM: "Have you had dizziness before?" If Yes, ask: "When was the last time?" "What happened that time?"     Every morning 10. OTHER SYMPTOMS: "Do you have any other symptoms?" (e.g., fever, chest pain, vomiting, diarrhea, bleeding)       no  Answer Assessment - Initial Assessment Questions 1. BLOOD GLUCOSE: "What is your blood glucose level?"      225 2. ONSET: "When did you check the blood glucose?"     Home machine 3. USUAL RANGE: "What is your glucose level usually?" (e.g., usual fasting morning value, usual evening value)     94-120 4. KETONES: "Do you check for ketones (urine or blood test strips)?" If Yes, ask: "What does the test show now?"       5. TYPE 1 or 2:  "Do you know what type of diabetes you have?"  (e.g., Type 1, Type 2, Gestational; doesn't know)      2 6. INSULIN: "Do you take insulin?" "What type of insulin(s) do you use? What is the mode of delivery? (syringe, pen; injection or pump)?"      yes 7. DIABETES PILLS: "Do you take any pills for your diabetes?" If Yes, ask: "Have you missed taking any pills recently?"  Yes - none missed 8. OTHER SYMPTOMS: "Do you have any symptoms?" (e.g., fever, frequent urination, difficulty breathing, dizziness, weakness, vomiting)     Dizziness  Protocols used: Dizziness - Lightheadedness-A-AH, Diabetes - High Blood Sugar-A-AH

## 2022-09-24 ENCOUNTER — Other Ambulatory Visit (HOSPITAL_COMMUNITY): Payer: Self-pay

## 2022-10-03 ENCOUNTER — Ambulatory Visit
Admission: RE | Admit: 2022-10-03 | Discharge: 2022-10-03 | Disposition: A | Discharge status: Home / Self Care | Payer: BC Managed Care – PPO | Source: Ambulatory Visit | Attending: Internal Medicine | Admitting: Internal Medicine

## 2022-10-03 DIAGNOSIS — I69354 Hemiplegia and hemiparesis following cerebral infarction affecting left non-dominant side: Secondary | ICD-10-CM | POA: Insufficient documentation

## 2022-10-03 DIAGNOSIS — R413 Other amnesia: Secondary | ICD-10-CM | POA: Diagnosis not present

## 2022-10-07 ENCOUNTER — Encounter: Payer: Self-pay | Admitting: Internal Medicine

## 2022-10-09 DIAGNOSIS — J3089 Other allergic rhinitis: Secondary | ICD-10-CM | POA: Diagnosis not present

## 2022-10-09 DIAGNOSIS — H1045 Other chronic allergic conjunctivitis: Secondary | ICD-10-CM | POA: Diagnosis not present

## 2022-10-09 DIAGNOSIS — R052 Subacute cough: Secondary | ICD-10-CM | POA: Diagnosis not present

## 2022-10-15 ENCOUNTER — Other Ambulatory Visit: Payer: Self-pay | Admitting: Internal Medicine

## 2022-10-15 NOTE — Telephone Encounter (Signed)
Requested Prescriptions  Pending Prescriptions Disp Refills   ezetimibe (ZETIA) 10 MG tablet [Pharmacy Med Name: EZETIMIBE TABS 10MG ] 90 tablet 3    Sig: TAKE 1 TABLET DAILY     Cardiovascular:  Antilipid - Sterol Transport Inhibitors Failed - 10/15/2022 12:11 AM      Failed - Lipid Panel in normal range within the last 12 months    Cholesterol  Date Value Ref Range Status  05/30/2022 151 <200 mg/dL Final  06/22/2014 211 (H) 0 - 200 mg/dL Final   Ldl Cholesterol, Calc  Date Value Ref Range Status  06/22/2014 SEE COMMENT 0 - 100 mg/dL Final    Comment:    LDL/VLDL - Unable to report VLDL and LDL due to a  - Triglyceride value that is 400 mg/dL or   - greater.    LDL Cholesterol (Calc)  Date Value Ref Range Status  05/30/2022 64 mg/dL (calc) Final    Comment:    Reference range: <100 . Desirable range <100 mg/dL for primary prevention;   <70 mg/dL for patients with CHD or diabetic patients  with > or = 2 CHD risk factors. Marland Kitchen LDL-C is now calculated using the Martin-Hopkins  calculation, which is a validated novel method providing  better accuracy than the Friedewald equation in the  estimation of LDL-C.  Cresenciano Genre et al. Annamaria Helling. [id number]): 2061-2068  (http://education.[employer].com/faq/FAQ164)    Direct LDL  Date Value Ref Range Status  11/07/2014 76.0 mg/dL Final    Comment:    Optimal:  <100 mg/dLNear or Above Optimal:  100-129 mg/dLBorderline High:  130-159 mg/dLHigh:  160-189 mg/dLVery High:  >190 mg/dL   HDL Cholesterol  Date Value Ref Range Status  06/22/2014 42 40 - 60 mg/dL Final   HDL  Date Value Ref Range Status  05/30/2022 61 > OR = 50 mg/dL Final   Triglycerides  Date Value Ref Range Status  05/30/2022 181 (H) <150 mg/dL Final  06/22/2014 473 (H) 0 - 200 mg/dL Final         Passed - AST in normal range and within 360 days    AST  Date Value Ref Range Status  05/30/2022 20 10 - 35 U/L Final   SGOT(AST)  Date Value Ref Range Status   06/22/2014 41 (H) 15 - 37 Unit/L Final         Passed - ALT in normal range and within 360 days    ALT  Date Value Ref Range Status  05/30/2022 25 6 - 29 U/L Final   SGPT (ALT)  Date Value Ref Range Status  06/22/2014 45 U/L Final    Comment:    14-63 NOTE: New Reference Range 01/31/14          Passed - Patient is not pregnant      Passed - Valid encounter within last 12 months    Recent Outpatient Visits           3 weeks ago Type 2 diabetes mellitus with hyperglycemia, without long-term current use of insulin [facility])   [facility] [place], Coralie Keens, NP   2 months ago Acute cough   [facility] [place], Coralie Keens, NP   2 months ago Acute non-recurrent frontal sinusitis   [facility] Olin Hauser, DO   6 months ago Type 2 diabetes mellitus with hyperglycemia, without long-term current use of insulin [facility])   [facility] [place], [place]  W, NP   7 months ago Need for influenza vaccination   [facility] [place], Coralie Keens, NP       Future Appointments             In 1 week Garnette Gunner, Coralie Keens, NP [facility], [place]   In 1 month Doren Custard, Melton Krebs, NP [facility]

## 2022-10-17 ENCOUNTER — Other Ambulatory Visit: Payer: Self-pay

## 2022-10-17 DIAGNOSIS — J3081 Allergic rhinitis due to animal (cat) (dog) hair and dander: Secondary | ICD-10-CM | POA: Diagnosis not present

## 2022-10-17 DIAGNOSIS — J3089 Other allergic rhinitis: Secondary | ICD-10-CM | POA: Diagnosis not present

## 2022-10-21 ENCOUNTER — Other Ambulatory Visit (HOSPITAL_COMMUNITY): Payer: Self-pay

## 2022-10-27 ENCOUNTER — Encounter: Payer: Self-pay | Admitting: Internal Medicine

## 2022-10-27 ENCOUNTER — Ambulatory Visit (INDEPENDENT_AMBULATORY_CARE_PROVIDER_SITE_OTHER): Payer: BC Managed Care – PPO | Admitting: Internal Medicine

## 2022-10-27 VITALS — BP 110/78 | HR 72 | Temp 96.9°F | Ht 62.0 in | Wt 150.0 lb

## 2022-10-27 DIAGNOSIS — Z0001 Encounter for general adult medical examination with abnormal findings: Secondary | ICD-10-CM | POA: Diagnosis not present

## 2022-10-27 DIAGNOSIS — E1165 Type 2 diabetes mellitus with hyperglycemia: Secondary | ICD-10-CM

## 2022-10-27 DIAGNOSIS — E663 Overweight: Secondary | ICD-10-CM | POA: Diagnosis not present

## 2022-10-27 DIAGNOSIS — Z1211 Encounter for screening for malignant neoplasm of colon: Secondary | ICD-10-CM

## 2022-10-27 DIAGNOSIS — Z6827 Body mass index (BMI) 27.0-27.9, adult: Secondary | ICD-10-CM

## 2022-10-27 DIAGNOSIS — Z1231 Encounter for screening mammogram for malignant neoplasm of breast: Secondary | ICD-10-CM | POA: Diagnosis not present

## 2022-10-27 MED ORDER — LEVEMIR FLEXTOUCH 100 UNIT/ML ~~LOC~~ SOPN: PEN_INJECTOR | SUBCUTANEOUS | 0 refills | Status: DC

## 2022-10-27 MED ORDER — LEVEMIR FLEXTOUCH 100 UNIT/ML ~~LOC~~ SOPN: 12.0000 [IU] | PEN_INJECTOR | Freq: Every day | SUBCUTANEOUS | 0 refills | Status: DC

## 2022-10-27 MED ORDER — TRESIBA FLEXTOUCH 100 UNIT/ML ~~LOC~~ SOPN: 12.0000 [IU] | PEN_INJECTOR | Freq: Every day | SUBCUTANEOUS | 0 refills | Status: DC

## 2022-10-27 MED ORDER — SITAGLIPTIN PHOSPHATE 100 MG PO TABS: 100.0000 mg | ORAL_TABLET | Freq: Every day | ORAL | 1 refills | Status: DC

## 2022-10-27 NOTE — Addendum Note (Signed)
Addended by: Lorre Munroe on: 10/27/2022 08:50 AM   Modules accepted: Orders
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
## 2022-10-27 NOTE — Patient Instructions (Signed)
Health Maintenance for Postmenopausal Women Menopause is a normal process in which your ability to get pregnant comes to an end. This process happens slowly over many months or years, usually between the ages of 48 and 55. Menopause is complete when you have missed your menstrual period for 12 months. It is important to talk with your health care provider about some of the most common conditions that affect women after menopause (postmenopausal women). These include heart disease, cancer, and bone loss (osteoporosis). Adopting a healthy lifestyle and getting preventive care can help to promote your health and wellness. The actions you take can also lower your chances of developing some of these common conditions. What are the signs and symptoms of menopause? During menopause, you may have the following symptoms: Hot flashes. These can be moderate or severe. Night sweats. Decrease in sex drive. Mood swings. Headaches. Tiredness (fatigue). Irritability. Memory problems. Problems falling asleep or staying asleep. Talk with your health care provider about treatment options for your symptoms. Do I need hormone replacement therapy? Hormone replacement therapy is effective in treating symptoms that are caused by menopause, such as hot flashes and night sweats. Hormone replacement carries certain risks, especially as you become older. If you are thinking about using estrogen or estrogen with progestin, discuss the benefits and risks with your health care provider. How can I reduce my risk for heart disease and stroke? The risk of heart disease, heart attack, and stroke increases as you age. One of the causes may be a change in the body's hormones during menopause. This can affect how your body uses dietary fats, triglycerides, and cholesterol. Heart attack and stroke are medical emergencies. There are many things that you can do to help prevent heart disease and stroke. Watch your blood pressure High  blood pressure causes heart disease and increases the risk of stroke. This is more likely to develop in people who have high blood pressure readings or are overweight. Have your blood pressure checked: Every 3-5 years if you are 18-39 years of age. Every year if you are 40 years old or older. Eat a healthy diet  Eat a diet that includes plenty of vegetables, fruits, low-fat dairy products, and lean protein. Do not eat a lot of foods that are high in solid fats, added sugars, or sodium. Get regular exercise Get regular exercise. This is one of the most important things you can do for your health. Most adults should: Try to exercise for at least 150 minutes each week. The exercise should increase your heart rate and make you sweat (moderate-intensity exercise). Try to do strengthening exercises at least twice each week. Do these in addition to the moderate-intensity exercise. Spend less time sitting. Even light physical activity can be beneficial. Other tips Work with your health care provider to achieve or maintain a healthy weight. Do not use any products that contain nicotine or tobacco. These products include cigarettes, chewing tobacco, and vaping devices, such as e-cigarettes. If you need help quitting, ask your health care provider. Know your numbers. Ask your health care provider to check your cholesterol and your blood sugar (glucose). Continue to have your blood tested as directed by your health care provider. Do I need screening for cancer? Depending on your health history and family history, you may need to have cancer screenings at different stages of your life. This may include screening for: Breast cancer. Cervical cancer. Lung cancer. Colorectal cancer. What is my risk for osteoporosis? After menopause, you may be   at increased risk for osteoporosis. Osteoporosis is a condition in which bone destruction happens more quickly than new bone creation. To help prevent osteoporosis or  the bone fractures that can happen because of osteoporosis, you may take the following actions: If you are 19-50 years old, get at least 1,000 mg of calcium and at least 600 international units (IU) of vitamin D per day. If you are older than age 50 but younger than age 70, get at least 1,200 mg of calcium and at least 600 international units (IU) of vitamin D per day. If you are older than age 70, get at least 1,200 mg of calcium and at least 800 international units (IU) of vitamin D per day. Smoking and drinking excessive alcohol increase the risk of osteoporosis. Eat foods that are rich in calcium and vitamin D, and do weight-bearing exercises several times each week as directed by your health care provider. How does menopause affect my mental health? Depression may occur at any age, but it is more common as you become older. Common symptoms of depression include: Feeling depressed. Changes in sleep patterns. Changes in appetite or eating patterns. Feeling an overall lack of motivation or enjoyment of activities that you previously enjoyed. Frequent crying spells. Talk with your health care provider if you think that you are experiencing any of these symptoms. General instructions See your health care provider for regular wellness exams and vaccines. This may include: Scheduling regular health, dental, and eye exams. Getting and maintaining your vaccines. These include: Influenza vaccine. Get this vaccine each year before the flu season begins. Pneumonia vaccine. Shingles vaccine. Tetanus, diphtheria, and pertussis (Tdap) booster vaccine. Your health care provider may also recommend other immunizations. Tell your health care provider if you have ever been abused or do not feel safe at home. Summary Menopause is a normal process in which your ability to get pregnant comes to an end. This condition causes hot flashes, night sweats, decreased interest in sex, mood swings, headaches, or lack  of sleep. Treatment for this condition may include hormone replacement therapy. Take actions to keep yourself healthy, including exercising regularly, eating a healthy diet, watching your weight, and checking your blood pressure and blood sugar levels. Get screened for cancer and depression. Make sure that you are up to date with all your vaccines. This information is not intended to replace advice given to you by your health care provider. Make sure you discuss any questions you have with your health care provider. Document Revised: 11/19/2020 Document Reviewed: 11/19/2020 Elsevier Patient Education  2023 Elsevier Inc.  

## 2022-10-27 NOTE — Assessment & Plan Note (Signed)
Encouraged her to consume a balanced diet and exercise regimen

## 2022-10-28 ENCOUNTER — Other Ambulatory Visit: Payer: Self-pay

## 2022-10-28 ENCOUNTER — Telehealth: Payer: Self-pay

## 2022-10-28 DIAGNOSIS — Z86010 Personal history of colonic polyps: Secondary | ICD-10-CM

## 2022-10-28 LAB — CBC
HCT: 35.9 % (ref 35.0–45.0)
Hemoglobin: 11.5 g/dL — ABNORMAL LOW (ref 11.7–15.5)
MCH: 30.5 pg (ref 27.0–33.0)
MCHC: 32 g/dL (ref 32.0–36.0)
MCV: 95.2 fL (ref 80.0–100.0)
MPV: 9.8 fL (ref 7.5–12.5)
Platelets: 223 10*3/uL (ref 140–400)
RBC: 3.77 10*6/uL — ABNORMAL LOW (ref 3.80–5.10)
RDW: 13.7 % (ref 11.0–15.0)
WBC: 5.1 10*3/uL (ref 3.8–10.8)

## 2022-10-28 LAB — COMPLETE METABOLIC PANEL WITH GFR
AG Ratio: 1.4 (calc) (ref 1.0–2.5)
ALT: 14 U/L (ref 6–29)
AST: 12 U/L (ref 10–35)
Albumin: 4 g/dL (ref 3.6–5.1)
Alkaline phosphatase (APISO): 91 U/L (ref 37–153)
BUN/Creatinine Ratio: 13 (calc) (ref 6–22)
BUN: 14 mg/dL (ref 7–25)
CO2: 22 mmol/L (ref 20–32)
Calcium: 8.8 mg/dL (ref 8.6–10.4)
Chloride: 112 mmol/L — ABNORMAL HIGH (ref 98–110)
Creat: 1.09 mg/dL — ABNORMAL HIGH (ref 0.50–1.05)
Globulin: 2.8 g/dL (calc) (ref 1.9–3.7)
Glucose, Bld: 143 mg/dL — ABNORMAL HIGH (ref 65–99)
Potassium: 4.1 mmol/L (ref 3.5–5.3)
Sodium: 142 mmol/L (ref 135–146)
Total Bilirubin: 0.3 mg/dL (ref 0.2–1.2)
Total Protein: 6.8 g/dL (ref 6.1–8.1)
eGFR: 56 mL/min/{1.73_m2} — ABNORMAL LOW (ref >=60)

## 2022-10-28 LAB — LIPID PANEL
Cholesterol: 115 mg/dL (ref <200)
HDL: 54 mg/dL (ref >=50)
LDL Cholesterol (Calc): 43 mg/dL (calc)
Non-HDL Cholesterol (Calc): 61 mg/dL (calc) (ref <130)
Total CHOL/HDL Ratio: 2.1 (calc) (ref <5.0)
Triglycerides: 92 mg/dL (ref <150)

## 2022-10-28 MED ORDER — GOLYTELY 236 G PO SOLR: 4000.0000 mL | Freq: Once | ORAL | 0 refills | Status: AC

## 2022-10-28 NOTE — Telephone Encounter (Signed)
Gastroenterology Pre-Procedure Review  Request Date: 11/17/22 Requesting Physician: Dr. Servando Snare  PATIENT REVIEW QUESTIONS: The patient responded to the following health history questions as indicated:    Golytely prep used due to patient has CKD.  1. Are you having any GI issues? no 2. Do you have a personal history of Polyps? yes (4mm sessile polyp noted on 11/25/2012 colonoscopy performed at [facility] by Dr. Jeani Hawking) 3. Do you have a family history of Colon Cancer or Polyps? no 4. Diabetes Mellitus? yes (Stop date for Januvia 05/04.  Stop date for Glipizide 05/05.  Take half usual dose of insulin the evening before colonoscopy) 5. Joint replacements in the past 12 months?no 6. Major health problems in the past 3 months?no 7. Any artificial heart valves, MVP, or defibrillator?no    MEDICATIONS & ALLERGIES:    Patient reports the following regarding taking any anticoagulation/antiplatelet therapy:   Plavix, Coumadin, Eliquis, Xarelto, Lovenox, Pradaxa, Brilinta, or Effient? no Aspirin? no  Patient confirms/reports the following medications:  Current Outpatient Medications  Medication Sig Dispense Refill   albuterol (VENTOLIN HFA) 108 (90 Base) MCG/ACT inhaler Inhale 2 puffs into the lungs every 4 (four) hours as needed for wheezing or shortness of breath. 8 g 2   amLODipine (NORVASC) 5 MG tablet TAKE 1 TABLET DAILY 90 tablet 2   aspirin EC 81 MG EC tablet Take 1 tablet (81 mg total) by mouth daily.     azelastine (ASTELIN) 0.1 % nasal spray Place 2 sprays into both nostrils 2 (two) times daily. Use in each nostril as directed 30 mL 12   dolutegravir-lamiVUDine (DOVATO) 50-300 MG tablet Take 1 tablet by mouth daily. 30 tablet 4   ezetimibe (ZETIA) 10 MG tablet TAKE 1 TABLET DAILY 90 tablet 2   FREESTYLE LITE test strip TEST BLOOD SUGAR TWICE DAILY AS DIRECTED 100 strip 11   glipiZIDE (GLUCOTROL) 10 MG tablet TAKE 1 TABLET TWICE A DAY BEFORE MEALS 180 tablet 0   glucose 4 GM  chewable tablet Chew 1 tablet (4 g total) by mouth as needed for low blood sugar. 30 tablet 3   insulin degludec (TRESIBA FLEXTOUCH) 100 UNIT/ML FlexTouch Pen Inject 12 Units into the skin daily. 15 mL 0   Insulin Pen Needle (B-D UF III MINI PEN NEEDLES) 31G X 5 MM MISC USE TO INJECT INSULIN SIX TIMES DAILY 100 each 0   methocarbamol (ROBAXIN) 500 MG tablet Take 1 tablet (500 mg total) by mouth every 8 (eight) hours as needed for muscle spasms. 20 tablet 0   mirtazapine (REMERON) 15 MG tablet Take 2 tablets (30 mg total) by mouth at bedtime. 180 tablet 3   montelukast (SINGULAIR) 10 MG tablet Take 1 tablet (10 mg total) by mouth at bedtime. 90 tablet 3   ondansetron (ZOFRAN-ODT) 4 MG disintegrating tablet Take 1-2 tablets (4-8 mg total) by mouth every 8 (eight) hours as needed for nausea or vomiting. 30 tablet 0   potassium chloride SA (KLOR-CON M20) 20 MEQ tablet TAKE 1 TABLET DAILY 90 tablet 0   rosuvastatin (CRESTOR) 10 MG tablet TAKE 1 TABLET DAILY 90 tablet 2   sitaGLIPtin (JANUVIA) 100 MG tablet Take 1 tablet (100 mg total) by mouth daily. 90 tablet 1   topiramate (TOPAMAX) 100 MG tablet TAKE 1 TABLET AT BEDTIME AS NEEDED 90 tablet 0   triamcinolone cream (KENALOG) 0.1 % Apply 1 application topically 2 (two) times daily. 30 g 0   No current facility-administered medications for this visit.  Patient confirms/reports the following allergies:  Allergies  Allergen Reactions   Azithromycin Itching    Muscle aches   Erythromycin Nausea And Vomiting   Penicillins Nausea And Vomiting    Did it involve swelling of the face/tongue/throat, SOB, or low BP? No Did it involve sudden or severe rash/hives, skin peeling, or any reaction on the inside of your mouth or nose? No Did you need to seek medical attention at a hospital or doctor's office? No When did it last happen?      childhood allergy If all above answers are "NO", may proceed with cephalosporin use.    Tetracycline Nausea And  Vomiting    No orders of the defined types were placed in this encounter.   AUTHORIZATION INFORMATION Primary Insurance: 1D#: Group #:  Secondary Insurance: 1D#: Group #:  SCHEDULE INFORMATION: Date: 11/17/22 Time: Location: [facility]

## 2022-10-30 DIAGNOSIS — Z0001 Encounter for general adult medical examination with abnormal findings: Secondary | ICD-10-CM | POA: Diagnosis not present

## 2022-10-30 DIAGNOSIS — E1165 Type 2 diabetes mellitus with hyperglycemia: Secondary | ICD-10-CM | POA: Diagnosis not present

## 2022-10-31 LAB — MICROALBUMIN / CREATININE URINE RATIO
Creatinine, Urine: 215 mg/dL (ref 20–275)
Microalb Creat Ratio: 2 mg/g creat (ref <30)
Microalb, Ur: 0.4 mg/dL

## 2022-11-10 MED ORDER — TRESIBA FLEXTOUCH 100 UNIT/ML ~~LOC~~ SOPN: 12.0000 [IU] | PEN_INJECTOR | Freq: Every day | SUBCUTANEOUS | 0 refills | Status: DC

## 2022-11-11 ENCOUNTER — Ambulatory Visit (INDEPENDENT_AMBULATORY_CARE_PROVIDER_SITE_OTHER): Payer: BC Managed Care – PPO | Admitting: Internal Medicine

## 2022-11-11 ENCOUNTER — Other Ambulatory Visit: Payer: Self-pay

## 2022-11-11 ENCOUNTER — Encounter: Payer: Self-pay | Admitting: Internal Medicine

## 2022-11-11 ENCOUNTER — Encounter: Payer: Self-pay | Admitting: Gastroenterology

## 2022-11-11 VITALS — BP 114/72 | HR 75 | Temp 96.9°F | Wt 147.0 lb

## 2022-11-11 DIAGNOSIS — Z9109 Other allergy status, other than to drugs and biological substances: Secondary | ICD-10-CM | POA: Diagnosis not present

## 2022-11-11 DIAGNOSIS — R0982 Postnasal drip: Secondary | ICD-10-CM | POA: Diagnosis not present

## 2022-11-11 DIAGNOSIS — R051 Acute cough: Secondary | ICD-10-CM

## 2022-11-11 MED ORDER — GUAIFENESIN ER 600 MG PO TB12: 600.0000 mg | ORAL_TABLET | Freq: Two times a day (BID) | ORAL | 0 refills | Status: DC

## 2022-11-11 MED ORDER — PROMETHAZINE-DM 6.25-15 MG/5ML PO SYRP: 5.0000 mL | ORAL_SOLUTION | Freq: Four times a day (QID) | ORAL | 0 refills | Status: DC | PRN

## 2022-11-11 NOTE — Patient Instructions (Signed)
Allergic Rhinitis, Adult  Allergic rhinitis is a reaction to allergens. Allergens are things that can cause an allergic reaction. This condition affects the lining inside the nose (mucous membrane). There are two types of allergic rhinitis: Seasonal. This type is also called hay fever. It happens only during some times of the year. Perennial. This type can happen at any time of the year. This condition cannot be spread from person to person (is not contagious). It can be mild, bad, or very bad. It can develop at any age and may be outgrown. What are the causes? Pollen from grasses, trees, and weeds. Other causes can be: Dust mites. Smoke. Mold. Car fumes. The pee (urine), spit, or dander of pets. Dander is dead skin cells from a pet. What increases the risk? You are more likely to develop this condition if: You have allergies in your family. You have problems like allergies in your family. You may have: Swelling of parts of your eyes and eyelids. Asthma. This affects how you breathe. Long-term redness and swelling on your skin. Food allergies. What are the signs or symptoms? The main symptom of this condition is a runny or stuffy nose (nasal congestion). Other symptoms may include: Sneezing or coughing. Itching and tearing of your eyes. Mucus that drips down the back of your throat (postnasal drip). This may cause a sore throat. Trouble sleeping. Feeling tired. Headache. How is this treated? There is no cure for this condition. You should avoid things that you are allergic to. Treatment can help to relieve symptoms. This may include: Medicines that block allergy symptoms, such as anti-inflammatories or antihistamines. These may be given as a shot, nasal spray, or pill. Avoiding things you are allergic to. Medicines that give you some of what you are allergic to over time. This is called immunotherapy. It is done if other treatments do not help. You may get: Shots. Medicine under  your tongue. Stronger medicines, if other treatments do not help. Follow these instructions at home: Avoiding allergens Find out what things you are allergic to and avoid them. To do this, try these things: If you get allergies any time of year: Replace carpet with wood, tile, or vinyl flooring. Carpet can trap pet dander and dust. Do not smoke. Do not allow smoking in your home. Change your heating and air conditioning filters at least once a month. If you get allergies only some times of the year: Keep windows closed when you can. Plan things to do outside when pollen counts are lowest. Check pollen counts before you plan things to do outside. When you come indoors, change your clothes and shower before you sit on furniture or bedding. If you are allergic to a pet: Keep the pet out of your bedroom. Vacuum, sweep, and dust often. General instructions Take over-the-counter and prescription medicines only as told by your doctor. Drink enough fluid to keep your pee pale yellow. Where to find more information American Academy of Allergy, Asthma & Immunology: aaaai.org Contact a doctor if: You have a fever. You get a cough that does not go away. You make high-pitched whistling sounds when you breathe, most often when you breathe out (wheeze). Your symptoms slow you down. Your symptoms stop you from doing your normal things each day. Get help right away if: You are short of breath. This symptom may be an emergency. Get help right away. Call 911. Do not wait to see if the symptom will go away. Do not drive yourself to the   hospital. This information is not intended to replace advice given to you by your health care provider. Make sure you discuss any questions you have with your health care provider. Document Revised: 03/10/2022 Document Reviewed: 03/10/2022 Elsevier Patient Education  2023 Elsevier Inc.  
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
## 2022-11-11 NOTE — Anesthesia Preprocedure Evaluation (Addendum)
Anesthesia Evaluation  Patient identified by MRN, date of birth, ID band Patient awake    Reviewed: Allergy & Precautions, H&P , NPO status , Patient's Chart, lab work & pertinent test results  Airway Mallampati: II  TM Distance: >3 FB Neck ROM: Full    Dental no notable dental hx. (+) Partial Lower, Partial Upper Missing bilateral upper central and lateral incisors:   Pulmonary asthma    Pulmonary exam normal breath sounds clear to auscultation       Cardiovascular hypertension, Normal cardiovascular exam Rhythm:Regular Rate:Normal     Neuro/Psych  Headaches PSYCHIATRIC DISORDERS  Depression    CVA 2019 w/residual mild left side weakness CVA  negative psych ROS   GI/Hepatic Neg liver ROS, PUD,,,Ulcerative colitis   Endo/Other  diabetes, Poorly Controlled, Type 2, Insulin Dependent  Hypoglycemic this am, received D50W, and to recheck accucheck; patient states hx hypoglycemia  Renal/GU Renal diseaseCKD stage III  negative genitourinary   Musculoskeletal  (+) Arthritis ,    Abdominal   Peds negative pediatric ROS (+)  Hematology  (+) HIV  Anesthesia Other Findings Asthma  Seasonal allergies Diabetes mellitus without complication (HCC)  Ulcerative colitis (HCC) Hyperlipidemia  HIV (human immunodeficiency virus infection) (HCC) Wears contact lenses  Wears partial dentures Stroke (HCC) Arthritis Headache  Hypertension Kidney disease  Vertigo    Reproductive/Obstetrics negative OB ROS                             Anesthesia Physical Anesthesia Plan  ASA: 3  Anesthesia Plan: General   Post-op Pain Management:    Induction: Intravenous  PONV Risk Score and Plan:   Airway Management Planned: Natural Airway and Nasal Cannula  Additional Equipment:   Intra-op Plan:   Post-operative Plan:   Informed Consent: I have reviewed the patients History and Physical, chart, labs and  discussed the procedure including the risks, benefits and alternatives for the proposed anesthesia with the patient or authorized representative who has indicated his/her understanding and acceptance.     Dental Advisory Given  Plan Discussed with: Anesthesiologist, CRNA and Surgeon  Anesthesia Plan Comments: (Patient consented for risks of anesthesia including but not limited to:  - adverse reactions to medications - risk of airway placement if required - damage to eyes, teeth, lips or other oral mucosa - nerve damage due to positioning  - sore throat or hoarseness - Damage to heart, brain, nerves, lungs, other parts of body or loss of life  Patient voiced understanding.)       Anesthesia Quick Evaluation

## 2022-11-13 ENCOUNTER — Other Ambulatory Visit (HOSPITAL_COMMUNITY): Payer: Self-pay

## 2022-11-17 ENCOUNTER — Ambulatory Visit: Payer: BC Managed Care – PPO | Admitting: Anesthesiology

## 2022-11-17 ENCOUNTER — Other Ambulatory Visit: Payer: Self-pay | Admitting: Internal Medicine

## 2022-11-17 ENCOUNTER — Ambulatory Visit
Admission: RE | Admit: 2022-11-17 | Discharge: 2022-11-17 | Disposition: A | Discharge status: Home / Self Care | Payer: BC Managed Care – PPO | Attending: Gastroenterology | Admitting: Gastroenterology

## 2022-11-17 ENCOUNTER — Encounter
Admission: RE | Disposition: A | Discharge status: Home / Self Care | Payer: Self-pay | Source: Home / Self Care | Attending: Gastroenterology

## 2022-11-17 ENCOUNTER — Encounter: Payer: Self-pay | Admitting: Gastroenterology

## 2022-11-17 ENCOUNTER — Other Ambulatory Visit: Payer: Self-pay

## 2022-11-17 DIAGNOSIS — D126 Benign neoplasm of colon, unspecified: Secondary | ICD-10-CM | POA: Diagnosis not present

## 2022-11-17 DIAGNOSIS — Z8711 Personal history of peptic ulcer disease: Secondary | ICD-10-CM | POA: Insufficient documentation

## 2022-11-17 DIAGNOSIS — N183 Chronic kidney disease, stage 3 unspecified: Secondary | ICD-10-CM | POA: Diagnosis not present

## 2022-11-17 DIAGNOSIS — I69954 Hemiplegia and hemiparesis following unspecified cerebrovascular disease affecting left non-dominant side: Secondary | ICD-10-CM | POA: Diagnosis not present

## 2022-11-17 DIAGNOSIS — D120 Benign neoplasm of cecum: Secondary | ICD-10-CM | POA: Diagnosis not present

## 2022-11-17 DIAGNOSIS — Z794 Long term (current) use of insulin: Secondary | ICD-10-CM | POA: Diagnosis not present

## 2022-11-17 DIAGNOSIS — Z21 Asymptomatic human immunodeficiency virus [HIV] infection status: Secondary | ICD-10-CM | POA: Insufficient documentation

## 2022-11-17 DIAGNOSIS — Z860100 Personal history of colon polyps, unspecified: Secondary | ICD-10-CM

## 2022-11-17 DIAGNOSIS — K635 Polyp of colon: Secondary | ICD-10-CM | POA: Diagnosis not present

## 2022-11-17 DIAGNOSIS — Z5986 Financial insecurity: Secondary | ICD-10-CM | POA: Diagnosis not present

## 2022-11-17 DIAGNOSIS — K640 First degree hemorrhoids: Secondary | ICD-10-CM | POA: Insufficient documentation

## 2022-11-17 DIAGNOSIS — E1165 Type 2 diabetes mellitus with hyperglycemia: Secondary | ICD-10-CM | POA: Insufficient documentation

## 2022-11-17 DIAGNOSIS — Z1211 Encounter for screening for malignant neoplasm of colon: Secondary | ICD-10-CM | POA: Diagnosis not present

## 2022-11-17 DIAGNOSIS — E11649 Type 2 diabetes mellitus with hypoglycemia without coma: Secondary | ICD-10-CM | POA: Insufficient documentation

## 2022-11-17 DIAGNOSIS — I129 Hypertensive chronic kidney disease with stage 1 through stage 4 chronic kidney disease, or unspecified chronic kidney disease: Secondary | ICD-10-CM | POA: Diagnosis not present

## 2022-11-17 DIAGNOSIS — E1122 Type 2 diabetes mellitus with diabetic chronic kidney disease: Secondary | ICD-10-CM | POA: Insufficient documentation

## 2022-11-17 HISTORY — PX: COLONOSCOPY WITH PROPOFOL: SHX5780

## 2022-11-17 HISTORY — DX: Disorder of kidney and ureter, unspecified: N28.9

## 2022-11-17 HISTORY — DX: Essential (primary) hypertension: I10

## 2022-11-17 HISTORY — DX: Dizziness and giddiness: R42

## 2022-11-17 HISTORY — PX: POLYPECTOMY: SHX5525

## 2022-11-17 LAB — GLUCOSE, CAPILLARY
Glucose-Capillary: 59 mg/dL — ABNORMAL LOW (ref 70–99)
Glucose-Capillary: 138 mg/dL — ABNORMAL HIGH (ref 70–99)

## 2022-11-17 SURGERY — COLONOSCOPY WITH PROPOFOL
Anesthesia: General | Site: Rectum

## 2022-11-17 MED ORDER — DEXTROSE 50 % IV SOLN
25.0000 mL | Freq: Once | INTRAVENOUS | Status: AC
  Administered 2022-11-17: 25 mL via INTRAVENOUS

## 2022-11-17 MED ORDER — PROPOFOL 10 MG/ML IV BOLUS
INTRAVENOUS | Status: DC | PRN
  Administered 2022-11-17: 40 mg via INTRAVENOUS
  Administered 2022-11-17: 80 mg via INTRAVENOUS
  Administered 2022-11-17: 20 mg via INTRAVENOUS
  Administered 2022-11-17 (×4): 40 mg via INTRAVENOUS

## 2022-11-17 MED ORDER — EPHEDRINE SULFATE (PRESSORS) 50 MG/ML IJ SOLN
INTRAMUSCULAR | Status: DC | PRN
  Administered 2022-11-17: 10 mg via INTRAVENOUS

## 2022-11-17 MED ORDER — SODIUM CHLORIDE 0.9 % IV SOLN: INTRAVENOUS | Status: DC

## 2022-11-17 MED ORDER — STERILE WATER FOR IRRIGATION IR SOLN
Status: DC | PRN
  Administered 2022-11-17: 1

## 2022-11-17 MED ORDER — LACTATED RINGERS IV SOLN: INTRAVENOUS | Status: DC

## 2022-11-17 MED ORDER — LIDOCAINE HCL (CARDIAC) PF 100 MG/5ML IV SOSY
PREFILLED_SYRINGE | INTRAVENOUS | Status: DC | PRN
  Administered 2022-11-17: 50 mg via INTRAVENOUS

## 2022-11-17 SURGICAL SUPPLY — 17 items
CLIP HMST 235XBRD CATH ROT (MISCELLANEOUS) IMPLANT
CLIP RESOLUTION 360 11X235 (MISCELLANEOUS) ×1
ELECT REM PT RETURN 9FT ADLT (ELECTROSURGICAL) ×1
ELECTRODE REM PT RTRN 9FT ADLT (ELECTROSURGICAL) IMPLANT
GOWN CVR UNV OPN BCK APRN NK (MISCELLANEOUS) ×4 IMPLANT
GOWN ISOL THUMB LOOP REG UNIV (MISCELLANEOUS) ×2
INJECTABLE ELEVIEW COMP 10 (MISCELLANEOUS) IMPLANT
INJECTOR VARIJECT VIN23 (MISCELLANEOUS) IMPLANT
KIT PRC NS LF DISP ENDO (KITS) ×2 IMPLANT
KIT PROCEDURE OLYMPUS (KITS) ×1
MANIFOLD NEPTUNE II (INSTRUMENTS) ×2 IMPLANT
SNARE COLD EXACTO (MISCELLANEOUS) IMPLANT
SNARE SHORT THROW 13M SML OVAL (MISCELLANEOUS) IMPLANT
SYR 10ML LL (SYRINGE) IMPLANT
TRAP ETRAP POLY (MISCELLANEOUS) IMPLANT
VARIJECT INJECTOR VIN23 (MISCELLANEOUS) ×1
WATER STERILE IRR 250ML POUR (IV SOLUTION) ×2 IMPLANT

## 2022-11-17 NOTE — Anesthesia Postprocedure Evaluation (Signed)
Anesthesia Post Note  Patient: [name]  Procedure(s) Performed: COLONOSCOPY WITH BIOPSY (Rectum) POLYPECTOMY (Rectum)  Patient location during evaluation: PACU Anesthesia Type: General Level of consciousness: awake and alert Pain management: pain level controlled Vital Signs Assessment: post-procedure vital signs reviewed and stable Respiratory status: spontaneous breathing, nonlabored ventilation, respiratory function stable and patient connected to nasal cannula oxygen Cardiovascular status: blood pressure returned to baseline and stable Postop Assessment: no apparent nausea or vomiting Anesthetic complications: no   No notable events documented.   Last Vitals:  Vitals:   11/17/22 0854 11/17/22 0910  BP: (!) 98/47 (!) 98/48  Pulse: 72 73  Resp: 18 13  Temp: (!) 36.4 C (!) 36.4 C  SpO2: 100% 98%    Last Pain:  Vitals:   11/17/22 0739  TempSrc: Temporal  PainSc: 0-No pain                 Lynlee Stratton C Shaunta Oncale

## 2022-11-17 NOTE — Transfer of Care (Signed)
Immediate Anesthesia Transfer of Care Note  Patient: [name]  Procedure(s) Performed: COLONOSCOPY WITH BIOPSY (Rectum) POLYPECTOMY (Rectum)  Patient Location: PACU  Anesthesia Type: General  Level of Consciousness: awake, alert  and patient cooperative  Airway and Oxygen Therapy: Patient Spontanous Breathing and Patient connected to supplemental oxygen  Post-op Assessment: Post-op Vital signs reviewed, Patient's Cardiovascular Status Stable, Respiratory Function Stable, Patent Airway and No signs of Nausea or vomiting  Post-op Vital Signs: Reviewed and stable  Complications: No notable events documented.

## 2022-11-17 NOTE — Op Note (Signed)
[facility] Patient Name: [name] Procedure Date: 11/17/2022 8:12 AM MRN: [id number] Account #: [id number] Date of Birth: 25-Jun-1956 Admit Type: Outpatient Age: 67 Room: [facility] OR ROOM 01 Gender: Female Note Status: Finalized Instrument Name: [id number] Procedure:             Colonoscopy Indications:           Screening for colorectal malignant neoplasm Providers:             Midge Minium MD, MD Referring MD:          Lorre Munroe (Referring MD) Medicines:             Propofol per Anesthesia Complications:         No immediate complications. Procedure:             Pre-Anesthesia Assessment:                        - Prior to the procedure, a History and Physical was                         performed, and patient medications and allergies were                         reviewed. The patient's tolerance of previous                         anesthesia was also reviewed. The risks and benefits                         of the procedure and the sedation options and risks                         were discussed with the patient. All questions were                         answered, and informed consent was obtained. Prior                         Anticoagulants: The patient has taken no anticoagulant                         or antiplatelet agents. ASA Grade Assessment: II - A                         patient with mild systemic disease. After reviewing                         the risks and benefits, the patient was deemed in                         satisfactory condition to undergo the procedure.                        After obtaining informed consent, the colonoscope was                         passed under direct vision. Throughout the procedure,  the patient's blood pressure, pulse, and oxygen                         saturations were monitored continuously. The                         Colonoscope was introduced through the anus and                          advanced to the the cecum, identified by appendiceal                         orifice and ileocecal valve. The colonoscopy was                         performed without difficulty. The patient tolerated                         the procedure well. The quality of the bowel                         preparation was excellent. Findings:      The perianal and digital rectal examinations were normal.      An 11 mm polyp was found in the cecum. The polyp was sessile.       Preparations were made for mucosal resection. Chromoscopy with indigo       carmine was done. Demarcation of the lesion was performed during the       procedure to clearly identify the boundaries of the lesion. Saline with       indigo carmine was injected to raise the lesion. Snare mucosal resection       was performed. Resection and retrieval were complete. Resected tissue       margins were examined and clear of polyp tissue. To close a defect after       polypectomy, one hemostatic clip was successfully placed (MR       conditional). Clip manufacturer: [employer]. There was no       bleeding at the end of the procedure.      Non-bleeding internal hemorrhoids were found during retroflexion. The       hemorrhoids were Grade I (internal hemorrhoids that do not prolapse). Impression:            - One 11 mm polyp in the cecum, removed with mucosal                         resection. Resected and retrieved. Clip (MR                         conditional) was placed. Clip manufacturer: [employer].                        - Non-bleeding internal hemorrhoids.                        - Mucosal resection was performed. Resection and  retrieval were complete. Recommendation:        - Discharge patient to home.                        - Resume previous diet.                        - Continue present medications.                        - Await pathology  results.                        - Repeat colonoscopy in 1 year for surveillance. Procedure Code(s):     --- Professional ---                        [phone], Colonoscopy, flexible; with endoscopic mucosal                         resection Diagnosis Code(s):     --- Professional ---                        Z12.11, Encounter for screening for malignant neoplasm                         of colon                        D12.0, Benign neoplasm of cecum CPT copyright 2022 American Medical Association. All rights reserved. The codes documented in this report are preliminary and upon coder review may  be revised to meet current compliance requirements. Midge Minium MD, MD 11/17/2022 8:57:28 AM This report has been signed electronically. Number of Addenda: 0 Note Initiated On: 11/17/2022 8:12 AM Scope Withdrawal Time: 0 hours 20 minutes 31 seconds  Total Procedure Duration: 0 hours 31 minutes 22 seconds  Estimated Blood Loss:  Estimated blood loss: none.      [facility]
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
## 2022-11-18 ENCOUNTER — Encounter: Payer: Self-pay | Admitting: Gastroenterology

## 2022-11-18 NOTE — Telephone Encounter (Signed)
Requested Prescriptions  Pending Prescriptions Disp Refills   topiramate (TOPAMAX) 100 MG tablet [Pharmacy Med Name: TOPIRAMATE TABS 100MG ] 90 tablet 0    Sig: TAKE 1 TABLET AT BEDTIME AS NEEDED     Neurology: Anticonvulsants - topiramate & zonisamide Failed - 11/17/2022  1:38 AM      Failed - Cr in normal range and within 360 days    Creat  Date Value Ref Range Status  10/27/2022 1.09 (H) 0.50 - 1.05 mg/dL Final   Creatinine,U  Date Value Ref Range Status  09/25/2020 145.4 mg/dL Final   Creatinine, Urine  Date Value Ref Range Status  10/30/2022 215 20 - 275 mg/dL Final         Passed - CO2 in normal range and within 360 days    CO2  Date Value Ref Range Status  10/27/2022 22 20 - 32 mmol/L Final   Co2  Date Value Ref Range Status  06/23/2014 27 21 - 32 mmol/L Final         Passed - ALT in normal range and within 360 days    ALT  Date Value Ref Range Status  10/27/2022 14 6 - 29 U/L Final   SGPT (ALT)  Date Value Ref Range Status  06/22/2014 45 U/L Final    Comment:    14-63 NOTE: New Reference Range 01/31/14          Passed - AST in normal range and within 360 days    AST  Date Value Ref Range Status  10/27/2022 12 10 - 35 U/L Final   SGOT(AST)  Date Value Ref Range Status  06/22/2014 41 (H) 15 - 37 Unit/L Final         Passed - Completed PHQ-2 or PHQ-9 in the last 360 days      Passed - Valid encounter within last 12 months    Recent Outpatient Visits           1 week ago Acute cough   [place] [facility] [place], Salvadore Oxford, NP   3 weeks ago Encounter for general adult medical examination with abnormal findings   [facility] [place], Salvadore Oxford, NP   1 month ago Type 2 diabetes mellitus with hyperglycemia, without long-term current use of insulin [facility])   [place] [facility] [place], Salvadore Oxford, NP   3 months ago Acute cough   [place] [facility] [place], [place] W, NP   3  months ago Acute non-recurrent frontal sinusitis   [facility] [place], Netta Neat, DO       Future Appointments             In 3 days Durwin Nora, Gomez Cleverly, NP [facility] for Infectious Disease, [facility]   In 2 months Baity, Salvadore Oxford, NP [place] [facility] [place], [facility]             KLOR-CON M20 20 MEQ tablet [Pharmacy Med Name: POTASSIUM CHLORIDE ER (DISP) TABS 20MEQ] 90 tablet 0    Sig: TAKE 1 TABLET DAILY     Endocrinology:  Minerals - Potassium Supplementation Failed - 11/17/2022  1:38 AM      Failed - Cr in normal range and within 360 days    Creat  Date Value Ref Range Status  10/27/2022 1.09 (H) 0.50 - 1.05 mg/dL Final   Creatinine,U  Date Value Ref Range Status  09/25/2020 145.4  mg/dL Final   Creatinine, Urine  Date Value Ref Range Status  10/30/2022 215 20 - 275 mg/dL Final         Passed - K in normal range and within 360 days    Potassium  Date Value Ref Range Status  10/27/2022 4.1 3.5 - 5.3 mmol/L Final  06/23/2014 4.2 3.5 - 5.1 mmol/L Final         Passed - Valid encounter within last 12 months    Recent Outpatient Visits           1 week ago Acute cough   [facility] [place], Salvadore Oxford, NP   3 weeks ago Encounter for general adult medical examination with abnormal findings   [place] [facility] [place], [place] W, NP   1 month ago Type 2 diabetes mellitus with hyperglycemia, without long-term current use of insulin [facility])   [place] [facility] [place], Salvadore Oxford, NP   3 months ago Acute cough   [place] [facility] [place], [place] W, NP   3 months ago Acute non-recurrent frontal sinusitis   [place] [facility] [place], Netta Neat, DO       Future Appointments             In 3 days Durwin Nora, Gomez Cleverly, NP [facility] for [facility], [facility]   In 2 months Baity, Salvadore Oxford,  NP  [facility]

## 2022-11-19 ENCOUNTER — Encounter: Payer: Self-pay | Admitting: Gastroenterology

## 2022-11-19 LAB — SURGICAL PATHOLOGY

## 2022-11-20 NOTE — Progress Notes (Signed)
Patient: [name]  DOB: 09-02-1955 MRN: [id number] PCP: Lorre Munroe, NP    Subjective:  Brief ID:  [name] is a 67 y.o. female with HIV infection diagnosed August 2019 on routine screening after having had a stroke. VL 118,000 copies, CD4 500.  HIV Risk: heterosexual.  OI Hx: none  Previous Regimens:  Biktarvy 02-2018: suppressed  Dovato 05/2022 (CKD switch)   Resistance Testing:  02-2018 wildtype virus    Chief Complaint  Patient presents with   Follow-up      HPI/ROS:  [name] is here today for routine HIV care and follow-up.  She continues her Dovato once daily without any concern over missed doses or side effects to medication.  No cost barrier with copay card applied.  She has had a lot of troubles with allergies and plans to see an allergist.  Had colonoscopy earlier this week - pathology revealing tubular adenoma, no high grade dysplasia / malignancy. Repeat in 1 year scheduled.  Planning cataract removal.   She has been feeling a little light headed recently. She has had several episodes of hypoglycemia - in the 40s at lowest. Fasting this morning is 61. She has been put on [place] long acting insulin daily and has not been eating a log of carbs. She has had a lot of stress from various angles in life and feels like this is contributing some too.   Pap smear negative 2020.    Review of Systems  Constitutional:  Negative for chills and fever.  HENT:  Negative for sore throat.   Respiratory:  Negative for cough and shortness of breath.   Cardiovascular: Negative.   Gastrointestinal:  Negative for abdominal pain, diarrhea and vomiting.  Musculoskeletal:  Negative for myalgias and neck pain.  Skin:  Negative for rash.  Neurological:  Negative for headaches.  Psychiatric/Behavioral:  The patient is not nervous/anxious.      Past Medical History:  Diagnosis Date   Arthritis    fingers   Asthma    no trouble lately   Diabetes  mellitus without complication (HCC) 11/29/2012   hx. NIDDM-dx. 3 weeks ago   Headache    migraines - none since stroke   HIV (human immunodeficiency virus infection) (HCC)    Hyperlipidemia    hx   Hypertension    Kidney disease    staged three   Seasonal allergies    has cough   Stroke (HCC) 02/18/2018   Residual Left side weakness and speech issues   Ulcerative colitis (HCC)    Vertigo    benign   Wears contact lenses    Wears partial dentures    upper/ has lower but doesn't wear    Outpatient Medications Prior to Visit  Medication Sig Dispense Refill   albuterol (VENTOLIN HFA) 108 (90 Base) MCG/ACT inhaler Inhale 2 puffs into the lungs every 4 (four) hours as needed for wheezing or shortness of breath. 8 g 2   amLODipine (NORVASC) 5 MG tablet TAKE 1 TABLET DAILY 90 tablet 2   aspirin EC 81 MG EC tablet Take 1 tablet (81 mg total) by mouth daily.     azelastine (ASTELIN) 0.1 % nasal spray Place 2 sprays into both nostrils 2 (two) times daily. Use in each nostril as directed (Patient taking differently: Place 2 sprays into both nostrils as needed. Use in each nostril as directed) 30 mL 12   ezetimibe (ZETIA) 10 MG tablet TAKE 1 TABLET DAILY 90 tablet 2  FREESTYLE LITE test strip TEST BLOOD SUGAR TWICE DAILY AS DIRECTED 100 strip 11   glipiZIDE (GLUCOTROL) 10 MG tablet TAKE 1 TABLET TWICE A DAY BEFORE MEALS 180 tablet 0   glucose 4 GM chewable tablet Chew 1 tablet (4 g total) by mouth as needed for low blood sugar. 30 tablet 3   guaiFENesin (MUCINEX) 600 MG 12 hr tablet Take 1 tablet (600 mg total) by mouth 2 (two) times daily. 10 tablet 0   insulin degludec (TRESIBA FLEXTOUCH) 100 UNIT/ML FlexTouch Pen Inject 12 Units into the skin daily. 15 mL 0   Insulin Pen Needle (B-D UF III MINI PEN NEEDLES) 31G X 5 MM MISC USE TO INJECT INSULIN SIX TIMES DAILY 100 each 0   methocarbamol (ROBAXIN) 500 MG tablet Take 1 tablet (500 mg total) by mouth every 8 (eight) hours as needed for muscle  spasms. 20 tablet 0   mirtazapine (REMERON) 15 MG tablet Take 30 mg by mouth at bedtime. 180 tablet 3   montelukast (SINGULAIR) 10 MG tablet Take 1 tablet (10 mg total) by mouth at bedtime. 90 tablet 3   potassium chloride SA (KLOR-CON M20) 20 MEQ tablet TAKE 1 TABLET DAILY 90 tablet 0   promethazine-dextromethorphan (PROMETHAZINE-DM) 6.25-15 MG/5ML syrup Take 5 mLs by mouth 4 (four) times daily as needed. 118 mL 0   rosuvastatin (CRESTOR) 10 MG tablet TAKE 1 TABLET DAILY 90 tablet 2   sitaGLIPtin (JANUVIA) 100 MG tablet Take 1 tablet (100 mg total) by mouth daily. 90 tablet 1   topiramate (TOPAMAX) 100 MG tablet TAKE 1 TABLET AT BEDTIME AS NEEDED 90 tablet 0   triamcinolone cream (KENALOG) 0.1 % Apply 1 application topically 2 (two) times daily. 30 g 0   dolutegravir-lamiVUDine (DOVATO) 50-300 MG tablet Take 1 tablet by mouth daily. 30 tablet 4   No facility-administered medications prior to visit.     Allergies  Allergen Reactions   Azithromycin Itching    Muscle aches   Erythromycin Nausea And Vomiting   Penicillins Nausea And Vomiting    Did it involve swelling of the face/tongue/throat, SOB, or low BP? No Did it involve sudden or severe rash/hives, skin peeling, or any reaction on the inside of your mouth or nose? No Did you need to seek medical attention at a hospital or doctor's office? No When did it last happen?      childhood allergy If all above answers are "NO", may proceed with cephalosporin use.    Tetracycline Nausea And Vomiting    Social History   Tobacco Use   Smoking status: Never   Smokeless tobacco: Never  Vaping Use   Vaping Use: Never used  Substance Use Topics   Alcohol use: Not Currently    Alcohol/week: 0.0 standard drinks of alcohol   Drug use: No    Objective:   Vitals:   11/21/22 0854  BP: (!) 145/76  Pulse: 75  Temp: 98 F (36.7 C)  TempSrc: Oral  Weight: 147 lb (66.7 kg)     Body mass index is 26.88 kg/m.  Physical  Exam Constitutional:      Appearance: Normal appearance. She is not ill-appearing.  HENT:     Mouth/Throat:     Mouth: Mucous membranes are moist.     Pharynx: Oropharynx is clear.  Eyes:     General: No scleral icterus. Cardiovascular:     Rate and Rhythm: Normal rate and regular rhythm.  Pulmonary:     Effort: Pulmonary effort is normal.  Neurological:     Mental Status: She is oriented to person, place, and time.  Psychiatric:        Mood and Affect: Mood normal.        Thought Content: Thought content normal.     Lab Results: HIV 1 RNA Quant (Copies/mL)  Date Value  07/11/2022 Not Detected  05/30/2022 Not Detected  07/24/2021 Not Detected   CD4 T Cell Abs (/uL)  Date Value  07/24/2021 885  11/26/2020 952  05/28/2020 876    Lab Results  Component Value Date   WBC 5.1 10/27/2022   HGB 11.5 (L) 10/27/2022   HCT 35.9 10/27/2022   MCV 95.2 10/27/2022   PLT 223 10/27/2022    Lab Results  Component Value Date   CREATININE 1.09 (H) 10/27/2022   BUN 14 10/27/2022   NA 142 10/27/2022   K 4.1 10/27/2022   CL 112 (H) 10/27/2022   CO2 22 10/27/2022    Lab Results  Component Value Date   ALT 14 10/27/2022   AST 12 10/27/2022   ALKPHOS 104 09/25/2020   BILITOT 0.3 10/27/2022     Assessment & Plan:   Problem List Items Addressed This Visit       Unprioritized   DM (diabetes mellitus), type 2 (HCC)    She describes being woken up early in the morning with shaking diaphoresis and blood sugars < 60.  I will message her PCP to discuss. I asked [name] to keep a snack with her in the event she has hypoglycemic episode out and about. May be a good candidate for CGM for closer attention to trends - she sees a nutritionist next week to optimize dietary choices.       Asymptomatic HIV infection, with no history of HIV-related illness (HCC) - Primary    [name] continues to do exceptionally well on treatment. Her HIV has remained in remission while on her Dovato.  Will send in 1 year of refills for her.  Repeat CD4 and VL today - I reviewed other chemistries, blood counts and lipid studies ordered by other providers.  She is on statin therapy with h/o cva.   Vaccines up to date - recommend flu / covid in the winter.  If she has not had shingrix would recommend she receive this for shingles prevention.   Return in about 6 months (around 05/24/2023).       Relevant Medications   dolutegravir-lamiVUDine (DOVATO) 50-300 MG tablet   Other Relevant Orders   HIV 1 RNA quant-no reflex-bld   T-helper cells (CD4) count (not at [facility])    Shannon Alberts, MSN, NP-C [facility]  [email or web address] Pager: [phone] Office: [phone] [facility] Main Line: [phone]    11/21/22  9:37 AM

## 2022-11-21 ENCOUNTER — Other Ambulatory Visit (HOSPITAL_COMMUNITY): Payer: Self-pay

## 2022-11-21 ENCOUNTER — Ambulatory Visit (INDEPENDENT_AMBULATORY_CARE_PROVIDER_SITE_OTHER): Payer: BC Managed Care – PPO | Admitting: Infectious Diseases

## 2022-11-21 ENCOUNTER — Other Ambulatory Visit: Payer: Self-pay

## 2022-11-21 ENCOUNTER — Other Ambulatory Visit: Payer: Self-pay | Admitting: Internal Medicine

## 2022-11-21 VITALS — BP 145/76 | HR 75 | Temp 98.0°F | Wt 147.0 lb

## 2022-11-21 DIAGNOSIS — E1165 Type 2 diabetes mellitus with hyperglycemia: Secondary | ICD-10-CM | POA: Diagnosis not present

## 2022-11-21 DIAGNOSIS — Z21 Asymptomatic human immunodeficiency virus [HIV] infection status: Secondary | ICD-10-CM

## 2022-11-21 DIAGNOSIS — Z794 Long term (current) use of insulin: Secondary | ICD-10-CM

## 2022-11-21 MED ORDER — DOVATO 50-300 MG PO TABS
1.0000 | ORAL_TABLET | Freq: Every day | ORAL | 11 refills | Status: DC
Start: 2022-11-21 — End: 2023-12-03
  Filled 2022-11-21 – 2022-11-27 (×2): qty 30, 30d supply, fill #0
  Filled 2022-12-22 – 2023-01-05 (×2): qty 30, 30d supply, fill #1
  Filled 2023-02-04: qty 30, 30d supply, fill #2
  Filled 2023-03-04: qty 30, 30d supply, fill #3
  Filled 2023-03-31: qty 30, 30d supply, fill #4
  Filled 2023-04-27: qty 30, 30d supply, fill #5
  Filled 2023-05-21: qty 30, 30d supply, fill #6
  Filled 2023-07-09: qty 30, 30d supply, fill #7
  Filled 2023-07-30: qty 30, 30d supply, fill #8
  Filled 2023-09-07: qty 30, 30d supply, fill #9
  Filled 2023-09-30 (×2): qty 30, 30d supply, fill #10
  Filled 2023-10-16: qty 30, 30d supply, fill #11

## 2022-11-21 MED ORDER — DEXCOM G7 SENSOR MISC
0 refills | Status: DC
Start: 2022-11-21 — End: 2023-01-26

## 2022-11-21 NOTE — Assessment & Plan Note (Signed)
She describes being woken up early in the morning with shaking diaphoresis and blood sugars < 60.  I will message her PCP to discuss. I asked [place] to keep a snack with her in the event she has hypoglycemic episode out and about. May be a good candidate for CGM for closer attention to trends - she sees a nutritionist next week to optimize dietary choices.

## 2022-11-21 NOTE — Assessment & Plan Note (Signed)
[name] continues to do exceptionally well on treatment. Her HIV has remained in remission while on her Dovato. Will send in 1 year of refills for her.  Repeat CD4 and VL today - I reviewed other chemistries, blood counts and lipid studies ordered by other providers.  She is on statin therapy with h/o cva.   Vaccines up to date - recommend flu / covid in the winter.  If she has not had shingrix would recommend she receive this for shingles prevention.   Return in about 6 months (around 05/24/2023).

## 2022-11-21 NOTE — Patient Instructions (Signed)
I will message [name] about what we talked about for your low blood sugars.  I think a continuous glucose monitor may help if your insurance will cover it - and will save you from having to poke your fingers as much.   Refills send in for your dovato for 1 year   I would love to see you back in 6 months.

## 2022-11-24 LAB — HIV-1 RNA QUANT-NO REFLEX-BLD
HIV 1 RNA Quant: NOT DETECTED Copies/mL
HIV-1 RNA Quant, Log: NOT DETECTED Log cps/mL

## 2022-11-24 LAB — T-HELPER CELLS (CD4) COUNT (NOT AT ARMC)
Absolute CD4: 847 cells/uL (ref 490–1740)
CD4 T Helper %: 49 % (ref 30–61)
Total lymphocyte count: 1745 cells/uL (ref 850–3900)

## 2022-11-26 ENCOUNTER — Other Ambulatory Visit: Payer: Self-pay | Admitting: Internal Medicine

## 2022-11-26 ENCOUNTER — Other Ambulatory Visit: Payer: Self-pay

## 2022-11-26 ENCOUNTER — Encounter: Payer: BC Managed Care – PPO | Attending: Internal Medicine | Admitting: Dietician

## 2022-11-26 ENCOUNTER — Encounter: Payer: Self-pay | Admitting: Dietician

## 2022-11-26 VITALS — Ht 62.0 in | Wt 148.0 lb

## 2022-11-26 DIAGNOSIS — J45909 Unspecified asthma, uncomplicated: Secondary | ICD-10-CM | POA: Diagnosis not present

## 2022-11-26 DIAGNOSIS — E119 Type 2 diabetes mellitus without complications: Secondary | ICD-10-CM | POA: Diagnosis not present

## 2022-11-26 DIAGNOSIS — Z713 Dietary counseling and surveillance: Secondary | ICD-10-CM | POA: Insufficient documentation

## 2022-11-26 DIAGNOSIS — E1165 Type 2 diabetes mellitus with hyperglycemia: Secondary | ICD-10-CM

## 2022-11-26 DIAGNOSIS — I10 Essential (primary) hypertension: Secondary | ICD-10-CM | POA: Diagnosis not present

## 2022-11-26 DIAGNOSIS — E785 Hyperlipidemia, unspecified: Secondary | ICD-10-CM | POA: Insufficient documentation

## 2022-11-26 DIAGNOSIS — M1990 Unspecified osteoarthritis, unspecified site: Secondary | ICD-10-CM | POA: Insufficient documentation

## 2022-11-26 MED ORDER — GLIPIZIDE 10 MG PO TABS: 10.0000 mg | ORAL_TABLET | Freq: Two times a day (BID) | ORAL | 0 refills | Status: DC

## 2022-11-26 MED ORDER — POTASSIUM CHLORIDE CRYS ER 20 MEQ PO TBCR: 20.0000 meq | EXTENDED_RELEASE_TABLET | Freq: Every day | ORAL | 0 refills | Status: DC

## 2022-11-26 NOTE — Patient Instructions (Addendum)
Aim for 150 minutes of physical activity weekly. Goal: walk the dog 4-5 times a week for 60 minutes.   Goal: aim to make 1/2 of your plate vegetables at least 2x/day.  Goal: Drink 6-8 8 oz water bottles daily (48-64 oz). Consider switching to a larger water bottle.   Aim to eat within 1-2 hours of waking up and every 3-5 hours following.   Aim to have a snack before bed that includes a carbohydrate and protein.   At meals, aim to include a lean protein, complex carb, and 1/2 plate non-starchy vegetables

## 2022-11-26 NOTE — Telephone Encounter (Signed)
Unable to refill per protocol, Rx requests are too soon.  Requested Prescriptions  Pending Prescriptions Disp Refills   potassium chloride SA (KLOR-CON M) 20 MEQ tablet [Pharmacy Med Name: POTASSIUM CL ER TABLETS] 90 tablet     Sig: TAKE 1 TABLET(20 MEQ) BY MOUTH DAILY     Endocrinology:  Minerals - Potassium Supplementation Failed - 11/26/2022 11:20 AM      Failed - Cr in normal range and within 360 days    Creat  Date Value Ref Range Status  10/27/2022 1.09 (H) 0.50 - 1.05 mg/dL Final   Creatinine,U  Date Value Ref Range Status  09/25/2020 145.4 mg/dL Final   Creatinine, Urine  Date Value Ref Range Status  10/30/2022 215 20 - 275 mg/dL Final         Passed - K in normal range and within 360 days    Potassium  Date Value Ref Range Status  10/27/2022 4.1 3.5 - 5.3 mmol/L Final  06/23/2014 4.2 3.5 - 5.1 mmol/L Final         Passed - Valid encounter within last 12 months    Recent Outpatient Visits           2 weeks ago Acute cough   [place] [facility] [place], Salvadore Oxford, NP   1 month ago Encounter for general adult medical examination with abnormal findings   [place] [facility] [place], Salvadore Oxford, NP   2 months ago Type 2 diabetes mellitus with hyperglycemia, without long-term current use of insulin [facility])   [place] [facility] [place], [place], NP   3 months ago Acute cough   [place] [facility] [place], [place], NP   3 months ago Acute non-recurrent frontal sinusitis   [place] [facility] [place], Netta Neat, DO       Future Appointments             In 2 months Baity, Salvadore Oxford, NP [place] [facility], [facility]   In 6 months Durwin Nora, Gomez Cleverly, NP [facility] for [facility], [facility]             JANUVIA 100 MG tablet [Pharmacy Med Name: JANUVIA 100MG  TABLETS] 90 tablet 1    Sig: TAKE 1 TABLET(100 MG) BY MOUTH DAILY      Endocrinology:  Diabetes - DPP-4 Inhibitors Failed - 11/26/2022 11:20 AM      Failed - Cr in normal range and within 360 days    Creat  Date Value Ref Range Status  10/27/2022 1.09 (H) 0.50 - 1.05 mg/dL Final   Creatinine,U  Date Value Ref Range Status  09/25/2020 145.4 mg/dL Final   Creatinine, Urine  Date Value Ref Range Status  10/30/2022 215 20 - 275 mg/dL Final         Passed - HBA1C is between 0 and 7.9 and within 180 days    Hemoglobin A1C  Date Value Ref Range Status  09/22/2022 7.7 (A) 4.0 - 5.6 % Final  06/22/2014 7.5 (H) 4.2 - 6.3 % Final    Comment:    The American Diabetes Association recommends that a primary goal of therapy should be <7% and that physicians should reevaluate the treatment regimen in patients with HbA1c values consistently >8%.    Hgb A1c MFr Bld  Date Value Ref Range Status  10/23/2021 6.8 (H) <5.7 % of total Hgb Final  Comment:    For someone without known diabetes, a hemoglobin A1c value of 6.5% or greater indicates that they may have  diabetes and this should be confirmed with a follow-up  test. . For someone with known diabetes, a value <7% indicates  that their diabetes is well controlled and a value  greater than or equal to 7% indicates suboptimal  control. A1c targets should be individualized based on  duration of diabetes, age, comorbid conditions, and  other considerations. . Currently, no consensus exists regarding use of hemoglobin A1c for diagnosis of diabetes for children. Verna Czech - Valid encounter within last 6 months    Recent Outpatient Visits           2 weeks ago Acute cough   [place] [facility] [place], Salvadore Oxford, NP   1 month ago Encounter for general adult medical examination with abnormal findings   [place] [facility] [place], [place] W, NP   2 months ago Type 2 diabetes mellitus with hyperglycemia, without long-term current use of insulin [facility]   [place]  [facility] [place], Salvadore Oxford, NP   3 months ago Acute cough   [place] [facility] [place], Salvadore Oxford, NP   3 months ago Acute non-recurrent frontal sinusitis   [place] [facility] [place], DO       Future Appointments             In 2 months Baity, Salvadore Oxford, NP [place] [facility], [facility]   In 6 months Durwin Nora, Gomez Cleverly, NP [facility] for [facility], [facility]

## 2022-11-26 NOTE — Addendum Note (Signed)
Addended by: Kavin Leech E on: 11/26/2022 10:44 AM   Modules accepted: Orders

## 2022-11-26 NOTE — Progress Notes (Signed)
Diabetes Self-Management Education  Visit Type: First/Initial  Appt. Start Time: 1025 Appt. End Time: 1055  11/26/2022  [name], identified by name and date of birth, is a 67 y.o. female with a diagnosis of Diabetes: Type 2.   ASSESSMENT  History includes: arthritis, asthma, type 2 diabetes, HLD, HTN, kidney disease, stroke.  Labs noted: 09/22/22 A1c 7.7% Medications include: glipizide, insulin degludec, januvia Supplements:  This is an abbreviated visit agreed upon with pt as pt was late.   Pt states her blood glucose is low most mornings (55mg /dL). Pt states she has been on insulin 6 months and feels that this was not happening before she was on insulin.   Pt states when her blood sugar is low she will drink sweet tea to bring it back up and usually only has grilled chicken for breakfast.   Pt states she only likes some vegetables. She reports she likes collards greens, green beans, cabbage, raw carrots, salads, raw broccoli (sometimes cooked).  Pt states she used to walk daily but ever since her stroke she stopped because she was scared. She states her daughter has a dog that she wants to start walking.   Height 5\' 2"  (1.575 m), weight 148 lb (67.1 kg). Body mass index is 27.07 kg/m.   Diabetes Self-Management Education - 11/26/22 1022       Visit Information   Visit Type First/Initial      Initial Visit   Diabetes Type Type 2    Date Diagnosed 2014    Are you currently following a meal plan? No    Are you taking your medications as prescribed? Yes      Health Coping   How would you rate your overall health? Good      Psychosocial Assessment   Patient Belief/Attitude about Diabetes Motivated to manage diabetes    What is the hardest part about your diabetes right now, causing you the most concern, or is the most worrisome to you about your diabetes?   Making healty food and beverage choices    Self-care barriers None    Self-management support  Doctor's office    Other persons present Patient    Patient Concerns Nutrition/Meal planning    Special Needs None    Preferred Learning Style No preference indicated    Learning Readiness Ready    How often do you need to have someone help you when you read instructions, pamphlets, or other written materials from your doctor or pharmacy? 1 - Never    What is the last grade level you completed in school? college      Pre-Education Assessment   Patient understands the diabetes disease and treatment process. Needs Instruction    Patient understands incorporating nutritional management into lifestyle. Needs Instruction    Patient undertands incorporating physical activity into lifestyle. Needs Instruction    Patient understands using medications safely. Needs Instruction    Patient understands monitoring blood glucose, interpreting and using results Needs Instruction    Patient understands prevention, detection, and treatment of acute complications. Needs Instruction    Patient understands prevention, detection, and treatment of chronic complications. Needs Instruction    Patient understands how to develop strategies to address psychosocial issues. Needs Instruction    Patient understands how to develop strategies to promote health/change behavior. Needs Instruction      Complications   Last HgB A1C per patient/outside source 7.7 %    How often do you check your blood sugar? 3-4 times/day  Fasting Blood glucose range (mg/dL) <16;10-960    Postprandial Blood glucose range (mg/dL) 45-409    Have you had a dilated eye exam in the past 12 months? Yes    Have you had a dental exam in the past 12 months? Yes    Are you checking your feet? Yes    How many days per week are you checking your feet? 7      Dietary Intake   Breakfast grilled chicken and sweet tea    Snack (morning) none    Lunch 1pm: grilled chicken salad (out to eat) OR sweet potato and shrimp    Snack (afternoon) none     Dinner baked chicken with potatoes and green beans OR none    Snack (evening) chips    Beverage(s) 1 large sweet tea throughout day, 32 oz water      Activity / Exercise   Activity / Exercise Type ADL's    How many days per week do you exercise? 0    How many minutes per day do you exercise? 0    Total minutes per week of exercise 0      Patient Education   Previous Diabetes Education No    Disease Pathophysiology Definition of diabetes, type 1 and 2, and the diagnosis of diabetes;Explored patient's options for treatment of their diabetes    Healthy Eating Role of diet in the treatment of diabetes and the relationship between the three main macronutrients and blood glucose level;Food label reading, portion sizes and measuring food.;Plate Method;Reviewed blood glucose goals for pre and post meals and how to evaluate the patients' food intake on their blood glucose level.;Meal timing in regards to the patients' current diabetes medication.;Meal options for control of blood glucose level and chronic complications.;Information on hints to eating out and maintain blood glucose control.    Being Active Role of exercise on diabetes management, blood pressure control and cardiac health.;Helped patient identify appropriate exercises in relation to his/her diabetes, diabetes complications and other health issue.;Identified with patient nutritional and/or medication changes necessary with exercise.    Medications Taught/reviewed insulin/injectables, injection, site rotation, insulin/injectables storage and needle disposal.;Reviewed medication adjustment guidelines for hyperglycemia and sick days.;Reviewed patients medication for diabetes, action, purpose, timing of dose and side effects.    Monitoring Identified appropriate SMBG and/or A1C goals.    Acute complications Taught prevention, symptoms, and  treatment of hypoglycemia - the 15 rule.;Discussed and identified patients' prevention, symptoms, and  treatment of hyperglycemia.    Chronic complications Relationship between chronic complications and blood glucose control;Identified and discussed with patient  current chronic complications    Diabetes Stress and Support Role of stress on diabetes    Lifestyle and Health Coping Lifestyle issues that need to be addressed for better diabetes care      Individualized Goals (developed by patient)   Nutrition General guidelines for healthy choices and portions discussed    Physical Activity Exercise 3-5 times per week;60 minutes per day    Medications take my medication as prescribed    Monitoring  Test my blood glucose as discussed    Problem Solving Eating Pattern    Reducing Risk examine blood glucose patterns;do foot checks daily;treat hypoglycemia with 15 grams of carbs if blood glucose less than 70mg /dL    Health Coping Ask for help with psychological, social, or emotional issues      Post-Education Assessment   Patient understands the diabetes disease and treatment process. Comprehends key points    Patient  understands incorporating nutritional management into lifestyle. Comprehends key points    Patient undertands incorporating physical activity into lifestyle. Comprehends key points    Patient understands using medications safely. Demonstrates understanding / competency    Patient understands monitoring blood glucose, interpreting and using results Demonstrates understanding / competency    Patient understands prevention, detection, and treatment of acute complications. Comprehends key points    Patient understands prevention, detection, and treatment of chronic complications. Comprehends key points    Patient understands how to develop strategies to address psychosocial issues. Comprehends key points    Patient understands how to develop strategies to promote health/change behavior. Comprehends key points      Outcomes   Expected Outcomes Demonstrated interest in learning. Expect  positive outcomes    Future DMSE 2 months    Program Status Not Completed             Individualized Plan for Diabetes Self-Management Training:   Learning Objective:  Patient will have a greater understanding of diabetes self-management. Patient education plan is to attend individual and/or group sessions per assessed needs and concerns.   Plan:   Patient Instructions  Aim for 150 minutes of physical activity weekly. Goal: walk the dog 4-5 times a week for 60 minutes.   Goal: aim to make 1/2 of your plate vegetables at least 2x/day.  Goal: Drink 6-8 8 oz water bottles daily (48-64 oz). Consider switching to a larger water bottle.   Aim to eat within 1-2 hours of waking up and every 3-5 hours following.   Aim to have a snack before bed that includes a carbohydrate and protein.   At meals, aim to include a lean protein, complex carb, and 1/2 plate non-starchy vegetables  Expected Outcomes:  Demonstrated interest in learning. Expect positive outcomes  Education material provided: ADA - How to Thrive: A Guide for Your Journey with Diabetes and Snack sheet  If problems or questions, patient to contact team via:  Phone  Future DSME appointment: 2 months

## 2022-11-27 ENCOUNTER — Other Ambulatory Visit (HOSPITAL_COMMUNITY): Payer: Self-pay

## 2022-11-27 DIAGNOSIS — J3089 Other allergic rhinitis: Secondary | ICD-10-CM | POA: Diagnosis not present

## 2022-11-27 DIAGNOSIS — J301 Allergic rhinitis due to pollen: Secondary | ICD-10-CM | POA: Diagnosis not present

## 2022-11-27 DIAGNOSIS — J3081 Allergic rhinitis due to animal (cat) (dog) hair and dander: Secondary | ICD-10-CM | POA: Diagnosis not present

## 2022-12-03 ENCOUNTER — Other Ambulatory Visit: Payer: Self-pay | Admitting: Internal Medicine

## 2022-12-03 NOTE — Telephone Encounter (Signed)
Requested Prescriptions  Pending Prescriptions Disp Refills   glipiZIDE (GLUCOTROL) 10 MG tablet [Pharmacy Med Name: GLIPIZIDE TABS 10MG ] 180 tablet 3    Sig: TAKE 1 TABLET TWICE A DAY BEFORE MEALS     Endocrinology:  Diabetes - Sulfonylureas Failed - 12/03/2022 12:53 AM      Failed - Cr in normal range and within 360 days    Creat  Date Value Ref Range Status  10/27/2022 1.09 (H) 0.50 - 1.05 mg/dL Final   Creatinine,U  Date Value Ref Range Status  09/25/2020 145.4 mg/dL Final   Creatinine, Urine  Date Value Ref Range Status  10/30/2022 215 20 - 275 mg/dL Final         Passed - HBA1C is between 0 and 7.9 and within 180 days    Hemoglobin A1C  Date Value Ref Range Status  09/22/2022 7.7 (A) 4.0 - 5.6 % Final  06/22/2014 7.5 (H) 4.2 - 6.3 % Final    Comment:    The American Diabetes Association recommends that a primary goal of therapy should be <7% and that physicians should reevaluate the treatment regimen in patients with HbA1c values consistently >8%.    Hgb A1c MFr Bld  Date Value Ref Range Status  10/23/2021 6.8 (H) <5.7 % of total Hgb Final    Comment:    For someone without known diabetes, a hemoglobin A1c value of 6.5% or greater indicates that they may have  diabetes and this should be confirmed with a follow-up  test. . For someone with known diabetes, a value <7% indicates  that their diabetes is well controlled and a value  greater than or equal to 7% indicates suboptimal  control. A1c targets should be individualized based on  duration of diabetes, age, comorbid conditions, and  other considerations. . Currently, no consensus exists regarding use of hemoglobin A1c for diagnosis of diabetes for children. Verna Czech - Valid encounter within last 6 months    Recent Outpatient Visits           3 weeks ago Acute cough   [place] [facility] [place], Salvadore Oxford, NP   1 month ago Encounter for general adult medical examination with  abnormal findings   [place] [facility] [place], [place] W, NP   2 months ago Type 2 diabetes mellitus with hyperglycemia, without long-term current use of insulin [facility])   [place] [facility] [place], Salvadore Oxford, NP   3 months ago Acute cough   [place] [facility] [place], Salvadore Oxford, NP   4 months ago Acute non-recurrent frontal sinusitis   [place] [facility] Smitty Cords, DO       Future Appointments             In 1 month Baity, Salvadore Oxford, NP [place] [facility], [facility]   In 5 months Durwin Nora, Gomez Cleverly, NP [facility], [facility]

## 2022-12-04 DIAGNOSIS — J3081 Allergic rhinitis due to animal (cat) (dog) hair and dander: Secondary | ICD-10-CM | POA: Diagnosis not present

## 2022-12-04 DIAGNOSIS — J3089 Other allergic rhinitis: Secondary | ICD-10-CM | POA: Diagnosis not present

## 2022-12-04 DIAGNOSIS — J301 Allergic rhinitis due to pollen: Secondary | ICD-10-CM | POA: Diagnosis not present

## 2022-12-05 DIAGNOSIS — H25813 Combined forms of age-related cataract, bilateral: Secondary | ICD-10-CM | POA: Diagnosis not present

## 2022-12-10 ENCOUNTER — Other Ambulatory Visit: Payer: Self-pay

## 2022-12-10 ENCOUNTER — Other Ambulatory Visit: Payer: Self-pay | Admitting: Internal Medicine

## 2022-12-10 DIAGNOSIS — E1165 Type 2 diabetes mellitus with hyperglycemia: Secondary | ICD-10-CM

## 2022-12-10 MED ORDER — SITAGLIPTIN PHOSPHATE 100 MG PO TABS
100.0000 mg | ORAL_TABLET | Freq: Every day | ORAL | 1 refills | Status: DC
Start: 2022-12-10 — End: 2023-08-18

## 2022-12-10 NOTE — Telephone Encounter (Signed)
Labs in date.  Requested Prescriptions  Pending Prescriptions Disp Refills   rosuvastatin (CRESTOR) 10 MG tablet [Pharmacy Med Name: ROSUVASTATIN TABS 10MG ] 90 tablet 3    Sig: TAKE 1 TABLET DAILY     Cardiovascular:  Antilipid - Statins 2 Failed - 12/10/2022  1:09 AM      Failed - Cr in normal range and within 360 days    Creat  Date Value Ref Range Status  10/27/2022 1.09 (H) 0.50 - 1.05 mg/dL Final   Creatinine,U  Date Value Ref Range Status  09/25/2020 145.4 mg/dL Final   Creatinine, Urine  Date Value Ref Range Status  10/30/2022 215 20 - 275 mg/dL Final         Failed - Lipid Panel in normal range within the last 12 months    Cholesterol  Date Value Ref Range Status  10/27/2022 115 <200 mg/dL Final  40/98/1191 478 (H) 0 - 200 mg/dL Final   Ldl Cholesterol, Calc  Date Value Ref Range Status  06/22/2014 SEE COMMENT 0 - 100 mg/dL Final    Comment:    LDL/VLDL - Unable to report VLDL and LDL due to a  - Triglyceride value that is 400 mg/dL or   - greater.    LDL Cholesterol (Calc)  Date Value Ref Range Status  10/27/2022 43 mg/dL (calc) Final    Comment:    Reference range: <100 . Desirable range <100 mg/dL for primary prevention;   <70 mg/dL for patients with CHD or diabetic patients  with > or = 2 CHD risk factors. Marland Kitchen LDL-C is now calculated using the Martin-Hopkins  calculation, which is a validated novel method providing  better accuracy than the Friedewald equation in the  estimation of LDL-C.  Horald Pollen et al. Lenox Ahr. 2956;213(08): 2061-2068  (http://education.[employer].com/faq/FAQ164)    Direct LDL  Date Value Ref Range Status  11/07/2014 76.0 mg/dL Final    Comment:    Optimal:  <100 mg/dLNear or Above Optimal:  100-129 mg/dLBorderline High:  130-159 mg/dLHigh:  160-189 mg/dLVery High:  >190 mg/dL   HDL Cholesterol  Date Value Ref Range Status  06/22/2014 42 40 - 60 mg/dL Final   HDL  Date Value Ref Range Status  10/27/2022 54 > OR = 50  mg/dL Final   Triglycerides  Date Value Ref Range Status  10/27/2022 92 <150 mg/dL Final  65/78/4696 295 (H) 0 - 200 mg/dL Final         Passed - Patient is not pregnant      Passed - Valid encounter within last 12 months    Recent Outpatient Visits           4 weeks ago Acute cough   [place] [facility] [place], Salvadore Oxford, NP   1 month ago Encounter for general adult medical examination with abnormal findings   [place] [facility] [place], [place] W, NP   2 months ago Type 2 diabetes mellitus with hyperglycemia, without long-term current use of insulin [facility])   [place] [facility] [place], Salvadore Oxford, NP   4 months ago Acute cough   [place] [facility] [place], [place] W, NP   4 months ago Acute non-recurrent frontal sinusitis   [place] [facility] Smitty Cords, DO       Future Appointments             In 1 month Baity, Salvadore Oxford, NP [facility] [place]  [facility], [facility]   In 5 months Durwin Nora, Gomez Cleverly, NP [facility] for [facility], [facility]

## 2022-12-11 DIAGNOSIS — J3089 Other allergic rhinitis: Secondary | ICD-10-CM | POA: Diagnosis not present

## 2022-12-11 DIAGNOSIS — J3081 Allergic rhinitis due to animal (cat) (dog) hair and dander: Secondary | ICD-10-CM | POA: Diagnosis not present

## 2022-12-11 DIAGNOSIS — J301 Allergic rhinitis due to pollen: Secondary | ICD-10-CM | POA: Diagnosis not present

## 2022-12-18 DIAGNOSIS — J301 Allergic rhinitis due to pollen: Secondary | ICD-10-CM | POA: Diagnosis not present

## 2022-12-18 DIAGNOSIS — J3081 Allergic rhinitis due to animal (cat) (dog) hair and dander: Secondary | ICD-10-CM | POA: Diagnosis not present

## 2022-12-18 DIAGNOSIS — J3089 Other allergic rhinitis: Secondary | ICD-10-CM | POA: Diagnosis not present

## 2022-12-22 ENCOUNTER — Other Ambulatory Visit (HOSPITAL_COMMUNITY): Payer: Self-pay

## 2022-12-22 ENCOUNTER — Other Ambulatory Visit: Payer: Self-pay

## 2022-12-22 MED ORDER — POTASSIUM CHLORIDE CRYS ER 20 MEQ PO TBCR: 20.0000 meq | EXTENDED_RELEASE_TABLET | Freq: Every day | ORAL | 0 refills | Status: DC

## 2022-12-22 MED ORDER — FREESTYLE LITE TEST VI STRP
ORAL_STRIP | 5 refills | Status: DC
Start: 2022-12-22 — End: 2023-02-20

## 2022-12-22 MED ORDER — TRESIBA FLEXTOUCH 100 UNIT/ML ~~LOC~~ SOPN: 12.0000 [IU] | PEN_INJECTOR | Freq: Every day | SUBCUTANEOUS | 0 refills | Status: DC

## 2022-12-22 NOTE — Telephone Encounter (Signed)
RX has been sent to your pharmacy.   Thanks,    -Vernona Rieger

## 2022-12-22 NOTE — Addendum Note (Signed)
Addended by: Kavin Leech E on: 12/22/2022 09:40 AM   Modules accepted: Orders

## 2022-12-23 ENCOUNTER — Other Ambulatory Visit (HOSPITAL_COMMUNITY): Payer: Self-pay

## 2022-12-25 DIAGNOSIS — J301 Allergic rhinitis due to pollen: Secondary | ICD-10-CM | POA: Diagnosis not present

## 2022-12-25 DIAGNOSIS — J3081 Allergic rhinitis due to animal (cat) (dog) hair and dander: Secondary | ICD-10-CM | POA: Diagnosis not present

## 2022-12-25 DIAGNOSIS — J3089 Other allergic rhinitis: Secondary | ICD-10-CM | POA: Diagnosis not present

## 2022-12-26 ENCOUNTER — Other Ambulatory Visit (HOSPITAL_COMMUNITY): Payer: Self-pay

## 2022-12-29 ENCOUNTER — Other Ambulatory Visit (HOSPITAL_COMMUNITY): Payer: Self-pay

## 2022-12-29 ENCOUNTER — Encounter (HOSPITAL_COMMUNITY): Payer: Self-pay

## 2023-01-01 DIAGNOSIS — J3081 Allergic rhinitis due to animal (cat) (dog) hair and dander: Secondary | ICD-10-CM | POA: Diagnosis not present

## 2023-01-01 DIAGNOSIS — J3089 Other allergic rhinitis: Secondary | ICD-10-CM | POA: Diagnosis not present

## 2023-01-01 DIAGNOSIS — J301 Allergic rhinitis due to pollen: Secondary | ICD-10-CM | POA: Diagnosis not present

## 2023-01-05 ENCOUNTER — Other Ambulatory Visit: Payer: Self-pay

## 2023-01-07 ENCOUNTER — Other Ambulatory Visit: Payer: Self-pay | Admitting: Internal Medicine

## 2023-01-07 DIAGNOSIS — E1165 Type 2 diabetes mellitus with hyperglycemia: Secondary | ICD-10-CM

## 2023-01-07 NOTE — Telephone Encounter (Signed)
Last RF 12/10/22 #90 1 RF  Requested Prescriptions  Refused Prescriptions Disp Refills   JANUVIA 100 MG tablet [Pharmacy Med Name: JANUVIA TABS 100MG ] 90 tablet 3    Sig: TAKE 1 TABLET DAILY     Endocrinology:  Diabetes - DPP-4 Inhibitors Failed - 01/07/2023  1:27 AM      Failed - Cr in normal range and within 360 days    Creat  Date Value Ref Range Status  10/27/2022 1.09 (H) 0.50 - 1.05 mg/dL Final   Creatinine,U  Date Value Ref Range Status  09/25/2020 145.4 mg/dL Final   Creatinine, Urine  Date Value Ref Range Status  10/30/2022 215 20 - 275 mg/dL Final         Passed - HBA1C is between 0 and 7.9 and within 180 days    Hemoglobin A1C  Date Value Ref Range Status  09/22/2022 7.7 (A) 4.0 - 5.6 % Final  06/22/2014 7.5 (H) 4.2 - 6.3 % Final    Comment:    The American Diabetes Association recommends that a primary goal of therapy should be <7% and that physicians should reevaluate the treatment regimen in patients with HbA1c values consistently >8%.    Hgb A1c MFr Bld  Date Value Ref Range Status  10/23/2021 6.8 (H) <5.7 % of total Hgb Final    Comment:    For someone without known diabetes, a hemoglobin A1c value of 6.5% or greater indicates that they may have  diabetes and this should be confirmed with a follow-up  test. . For someone with known diabetes, a value <7% indicates  that their diabetes is well controlled and a value  greater than or equal to 7% indicates suboptimal  control. A1c targets should be individualized based on  duration of diabetes, age, comorbid conditions, and  other considerations. . Currently, no consensus exists regarding use of hemoglobin A1c for diagnosis of diabetes for children. Verna Czech - Valid encounter within last 6 months    Recent Outpatient Visits           1 month ago Acute cough   [place] [facility] [place], Salvadore Oxford, NP   2 months ago Encounter for general adult medical examination with  abnormal findings   [place] [facility] [place], [place] W, NP   3 months ago Type 2 diabetes mellitus with hyperglycemia, without long-term current use of insulin [facility])   [place] [facility] [place], Salvadore Oxford, NP   4 months ago Acute cough   [place] [facility] [place], Salvadore Oxford, NP   5 months ago Acute non-recurrent frontal sinusitis   [place] [facility] Smitty Cords, DO       Future Appointments             In 2 weeks [place], Salvadore Oxford, NP  [facility], [facility]   In 4 months Durwin Nora, Gomez Cleverly, NP [facility] for [facility], [facility]

## 2023-01-08 ENCOUNTER — Other Ambulatory Visit: Payer: Self-pay | Admitting: Internal Medicine

## 2023-01-08 DIAGNOSIS — J3089 Other allergic rhinitis: Secondary | ICD-10-CM | POA: Diagnosis not present

## 2023-01-08 DIAGNOSIS — J3081 Allergic rhinitis due to animal (cat) (dog) hair and dander: Secondary | ICD-10-CM | POA: Diagnosis not present

## 2023-01-08 DIAGNOSIS — J301 Allergic rhinitis due to pollen: Secondary | ICD-10-CM | POA: Diagnosis not present

## 2023-01-11 ENCOUNTER — Other Ambulatory Visit: Payer: Self-pay | Admitting: Internal Medicine

## 2023-01-12 ENCOUNTER — Other Ambulatory Visit (HOSPITAL_COMMUNITY): Payer: Self-pay

## 2023-01-12 MED ORDER — GLIPIZIDE 10 MG PO TABS: 10.0000 mg | ORAL_TABLET | Freq: Two times a day (BID) | ORAL | 1 refills | Status: DC

## 2023-01-12 MED ORDER — TRESIBA FLEXTOUCH 100 UNIT/ML ~~LOC~~ SOPN: 12.0000 [IU] | PEN_INJECTOR | Freq: Every day | SUBCUTANEOUS | 0 refills | Status: DC

## 2023-01-12 MED ORDER — POTASSIUM CHLORIDE CRYS ER 20 MEQ PO TBCR
20.0000 meq | EXTENDED_RELEASE_TABLET | Freq: Every day | ORAL | 0 refills | Status: DC
  Filled 2023-01-12 – 2023-03-24 (×3): qty 30, 30d supply, fill #0

## 2023-01-12 NOTE — Telephone Encounter (Signed)
Requested Prescriptions  Pending Prescriptions Disp Refills   B-D UF III MINI PEN NEEDLES 31G X 5 MM MISC [Pharmacy Med Name: BD PEN NED UF MINI 31G3/16] 90 each 23    Sig: USE TO INJECT INSULIN SIX TIMES DAILY     Endocrinology: Diabetes - Testing Supplies Passed - 01/08/2023  7:06 PM      Passed - Valid encounter within last 12 months    Recent Outpatient Visits           2 months ago Acute cough   [place] [facility] [place], Salvadore Oxford, NP   2 months ago Encounter for general adult medical examination with abnormal findings   [place] [facility] [place], [place] W, NP   3 months ago Type 2 diabetes mellitus with hyperglycemia, without long-term current use of insulin [facility])   [place] [facility] [place], Salvadore Oxford, NP   5 months ago Acute cough   [place] [facility] [place], [place] W, NP   5 months ago Acute non-recurrent frontal sinusitis   [place] [facility] Smitty Cords, DO       Future Appointments             In 2 weeks Baity, Salvadore Oxford, NP  [facility], [facility]   In 4 months Durwin Nora, Gomez Cleverly, NP [facility] for Infectious Disease, [facility]

## 2023-01-13 ENCOUNTER — Other Ambulatory Visit: Payer: Self-pay

## 2023-01-13 ENCOUNTER — Other Ambulatory Visit (HOSPITAL_COMMUNITY): Payer: Self-pay

## 2023-01-13 ENCOUNTER — Encounter: Payer: Self-pay | Admitting: Pharmacist

## 2023-01-19 ENCOUNTER — Other Ambulatory Visit: Payer: Self-pay

## 2023-01-22 DIAGNOSIS — J3089 Other allergic rhinitis: Secondary | ICD-10-CM | POA: Diagnosis not present

## 2023-01-22 DIAGNOSIS — J3081 Allergic rhinitis due to animal (cat) (dog) hair and dander: Secondary | ICD-10-CM | POA: Diagnosis not present

## 2023-01-22 DIAGNOSIS — J301 Allergic rhinitis due to pollen: Secondary | ICD-10-CM | POA: Diagnosis not present

## 2023-01-23 ENCOUNTER — Other Ambulatory Visit (HOSPITAL_COMMUNITY): Payer: Self-pay

## 2023-01-26 ENCOUNTER — Encounter: Payer: Self-pay | Admitting: Internal Medicine

## 2023-01-26 ENCOUNTER — Other Ambulatory Visit (HOSPITAL_COMMUNITY): Payer: Self-pay

## 2023-01-26 ENCOUNTER — Ambulatory Visit (INDEPENDENT_AMBULATORY_CARE_PROVIDER_SITE_OTHER): Payer: BC Managed Care – PPO | Admitting: Internal Medicine

## 2023-01-26 VITALS — BP 104/62 | HR 78 | Temp 96.4°F | Wt 149.0 lb

## 2023-01-26 DIAGNOSIS — Z1231 Encounter for screening mammogram for malignant neoplasm of breast: Secondary | ICD-10-CM

## 2023-01-26 DIAGNOSIS — G43019 Migraine without aura, intractable, without status migrainosus: Secondary | ICD-10-CM | POA: Diagnosis not present

## 2023-01-26 DIAGNOSIS — E785 Hyperlipidemia, unspecified: Secondary | ICD-10-CM

## 2023-01-26 DIAGNOSIS — N1831 Chronic kidney disease, stage 3a: Secondary | ICD-10-CM

## 2023-01-26 DIAGNOSIS — I69354 Hemiplegia and hemiparesis following cerebral infarction affecting left non-dominant side: Secondary | ICD-10-CM

## 2023-01-26 DIAGNOSIS — F339 Major depressive disorder, recurrent, unspecified: Secondary | ICD-10-CM

## 2023-01-26 DIAGNOSIS — E1169 Type 2 diabetes mellitus with other specified complication: Secondary | ICD-10-CM

## 2023-01-26 DIAGNOSIS — I10 Essential (primary) hypertension: Secondary | ICD-10-CM

## 2023-01-26 DIAGNOSIS — K5190 Ulcerative colitis, unspecified, without complications: Secondary | ICD-10-CM

## 2023-01-26 DIAGNOSIS — Z8673 Personal history of transient ischemic attack (TIA), and cerebral infarction without residual deficits: Secondary | ICD-10-CM

## 2023-01-26 DIAGNOSIS — Z21 Asymptomatic human immunodeficiency virus [HIV] infection status: Secondary | ICD-10-CM

## 2023-01-26 DIAGNOSIS — E1165 Type 2 diabetes mellitus with hyperglycemia: Secondary | ICD-10-CM | POA: Diagnosis not present

## 2023-01-26 DIAGNOSIS — L308 Other specified dermatitis: Secondary | ICD-10-CM

## 2023-01-26 DIAGNOSIS — Z6827 Body mass index (BMI) 27.0-27.9, adult: Secondary | ICD-10-CM

## 2023-01-26 DIAGNOSIS — E663 Overweight: Secondary | ICD-10-CM

## 2023-01-26 LAB — POCT GLYCOSYLATED HEMOGLOBIN (HGB A1C): HbA1c, POC (controlled diabetic range): 7 % (ref 0.0–7.0)

## 2023-01-26 LAB — CBC
HCT: 37 % (ref 35.0–45.0)
Hemoglobin: 12 g/dL (ref 11.7–15.5)
MCH: 31.2 pg (ref 27.0–33.0)
Platelets: 211 10*3/uL (ref 140–400)
WBC: 5.3 10*3/uL (ref 3.8–10.8)

## 2023-01-26 MED ORDER — TOPIRAMATE 50 MG PO TABS: 50.0000 mg | ORAL_TABLET | Freq: Two times a day (BID) | ORAL | 0 refills | Status: DC

## 2023-01-26 MED ORDER — MIRTAZAPINE 30 MG PO TBDP: 30.0000 mg | ORAL_TABLET | Freq: Every day | ORAL | 1 refills | Status: DC

## 2023-01-26 MED ORDER — METHOCARBAMOL 500 MG PO TABS: 500.0000 mg | ORAL_TABLET | Freq: Three times a day (TID) | ORAL | 0 refills | Status: DC | PRN

## 2023-01-26 MED ORDER — TOPIRAMATE 50 MG PO TABS: 50.0000 mg | ORAL_TABLET | Freq: Two times a day (BID) | ORAL | 1 refills | Status: DC

## 2023-01-26 NOTE — Assessment & Plan Note (Signed)
Encourage diet and exercise for weight loss 

## 2023-01-26 NOTE — Assessment & Plan Note (Signed)
Will decrease Topamax to 50 mg at bedtime

## 2023-01-26 NOTE — Assessment & Plan Note (Signed)
Continue Dovato per ID.

## 2023-01-26 NOTE — Assessment & Plan Note (Addendum)
Deteriorated off meds, will have her restart mirtazapine 30 mg Support offered
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
## 2023-01-26 NOTE — Patient Instructions (Signed)

## 2023-01-26 NOTE — Assessment & Plan Note (Signed)
Discussed the importance of good blood pressure, cholesterol and diabetes control C-Met and lipid profile today Encouraged her consume a low-fat diet Continue rosuvastatin, ezetimibe and aspirin

## 2023-01-26 NOTE — Assessment & Plan Note (Signed)
 C-Met and lipid profile today Encouraged her to consume a low-fat diet Continue rosuvastatin and ezetimibe

## 2023-01-26 NOTE — Assessment & Plan Note (Signed)
Asymptomatic off meds We will monitor

## 2023-01-26 NOTE — Assessment & Plan Note (Signed)
Encourage daily strengthening exercises

## 2023-01-26 NOTE — Assessment & Plan Note (Signed)
POCT A1c 7% Urine microalbumin has been checked within the last year Encouraged low-carb diet and exercise for weight loss Continue glipizide, Januvia and Tresiba Encourage routine eye exam Encouraged routine foot exam Immunizations UTD

## 2023-01-26 NOTE — Assessment & Plan Note (Signed)
 C-Met today 

## 2023-01-26 NOTE — Assessment & Plan Note (Signed)
Continue triamcinolone as needed.

## 2023-01-26 NOTE — Assessment & Plan Note (Signed)
Somewhat low in the office today and has been complaining of dizziness We will stop amlodipine and plan to repeat her blood pressure in 2 weeks Reinforced DASH diet and exercise for weight loss

## 2023-01-27 ENCOUNTER — Ambulatory Visit: Payer: BC Managed Care – PPO | Admitting: Dietician

## 2023-01-27 LAB — CBC
MCHC: 32.4 g/dL (ref 32.0–36.0)
MCV: 96.1 fL (ref 80.0–100.0)
MPV: 9.7 fL (ref 7.5–12.5)
RBC: 3.85 10*6/uL (ref 3.80–5.10)
RDW: 13.3 % (ref 11.0–15.0)

## 2023-01-27 LAB — COMPLETE METABOLIC PANEL WITH GFR
AG Ratio: 1.7 (calc) (ref 1.0–2.5)
ALT: 34 U/L — ABNORMAL HIGH (ref 6–29)
AST: 27 U/L (ref 10–35)
Albumin: 4.3 g/dL (ref 3.6–5.1)
Alkaline phosphatase (APISO): 105 U/L (ref 37–153)
BUN/Creatinine Ratio: 13 (calc) (ref 6–22)
BUN: 15 mg/dL (ref 7–25)
CO2: 25 mmol/L (ref 20–32)
Calcium: 9.3 mg/dL (ref 8.6–10.4)
Chloride: 110 mmol/L (ref 98–110)
Creat: 1.16 mg/dL — ABNORMAL HIGH (ref 0.50–1.05)
Globulin: 2.6 g/dL (calc) (ref 1.9–3.7)
Glucose, Bld: 102 mg/dL — ABNORMAL HIGH (ref 65–99)
Potassium: 3.9 mmol/L (ref 3.5–5.3)
Sodium: 142 mmol/L (ref 135–146)
Total Bilirubin: 0.3 mg/dL (ref 0.2–1.2)
Total Protein: 6.9 g/dL (ref 6.1–8.1)
eGFR: 52 mL/min/{1.73_m2} — ABNORMAL LOW (ref >=60)

## 2023-01-27 LAB — LIPID PANEL
Cholesterol: 138 mg/dL (ref <200)
HDL: 57 mg/dL (ref >=50)
LDL Cholesterol (Calc): 62 mg/dL (calc)
Non-HDL Cholesterol (Calc): 81 mg/dL (calc) (ref <130)
Total CHOL/HDL Ratio: 2.4 (calc) (ref <5.0)
Triglycerides: 103 mg/dL (ref <150)

## 2023-01-28 ENCOUNTER — Other Ambulatory Visit (HOSPITAL_COMMUNITY): Payer: Self-pay

## 2023-01-28 ENCOUNTER — Encounter (HOSPITAL_COMMUNITY): Payer: Self-pay

## 2023-01-30 DIAGNOSIS — J3081 Allergic rhinitis due to animal (cat) (dog) hair and dander: Secondary | ICD-10-CM | POA: Diagnosis not present

## 2023-01-30 DIAGNOSIS — J301 Allergic rhinitis due to pollen: Secondary | ICD-10-CM | POA: Diagnosis not present

## 2023-01-30 DIAGNOSIS — J3089 Other allergic rhinitis: Secondary | ICD-10-CM | POA: Diagnosis not present

## 2023-02-02 ENCOUNTER — Encounter: Payer: Self-pay | Admitting: Pharmacy Technician

## 2023-02-04 ENCOUNTER — Other Ambulatory Visit (HOSPITAL_COMMUNITY): Payer: Self-pay

## 2023-02-05 DIAGNOSIS — J301 Allergic rhinitis due to pollen: Secondary | ICD-10-CM | POA: Diagnosis not present

## 2023-02-05 DIAGNOSIS — J3081 Allergic rhinitis due to animal (cat) (dog) hair and dander: Secondary | ICD-10-CM | POA: Diagnosis not present

## 2023-02-05 DIAGNOSIS — J3089 Other allergic rhinitis: Secondary | ICD-10-CM | POA: Diagnosis not present

## 2023-02-18 DIAGNOSIS — H18603 Keratoconus, unspecified, bilateral: Secondary | ICD-10-CM | POA: Diagnosis not present

## 2023-02-18 DIAGNOSIS — H25813 Combined forms of age-related cataract, bilateral: Secondary | ICD-10-CM | POA: Diagnosis not present

## 2023-02-19 DIAGNOSIS — J301 Allergic rhinitis due to pollen: Secondary | ICD-10-CM | POA: Diagnosis not present

## 2023-02-19 DIAGNOSIS — J3081 Allergic rhinitis due to animal (cat) (dog) hair and dander: Secondary | ICD-10-CM | POA: Diagnosis not present

## 2023-02-19 DIAGNOSIS — J3089 Other allergic rhinitis: Secondary | ICD-10-CM | POA: Diagnosis not present

## 2023-02-20 ENCOUNTER — Telehealth (INDEPENDENT_AMBULATORY_CARE_PROVIDER_SITE_OTHER): Payer: BC Managed Care – PPO | Admitting: Internal Medicine

## 2023-02-20 ENCOUNTER — Encounter: Payer: Self-pay | Admitting: Internal Medicine

## 2023-02-20 VITALS — BP 124/78 | HR 82

## 2023-02-20 DIAGNOSIS — Z6827 Body mass index (BMI) 27.0-27.9, adult: Secondary | ICD-10-CM | POA: Diagnosis not present

## 2023-02-20 DIAGNOSIS — I10 Essential (primary) hypertension: Secondary | ICD-10-CM

## 2023-02-20 DIAGNOSIS — E663 Overweight: Secondary | ICD-10-CM | POA: Diagnosis not present

## 2023-02-20 DIAGNOSIS — E1165 Type 2 diabetes mellitus with hyperglycemia: Secondary | ICD-10-CM

## 2023-02-20 MED ORDER — FREESTYLE LITE TEST VI STRP: ORAL_STRIP | 5 refills | Status: DC

## 2023-02-20 NOTE — Assessment & Plan Note (Signed)
Encourage diet and exercise for weight loss 

## 2023-02-20 NOTE — Patient Instructions (Signed)
Hypertension, Adult Hypertension is another name for high blood pressure. High blood pressure forces your heart to work harder to pump blood. This can cause problems over time. There are two numbers in a blood pressure reading. There is a top number (systolic) over a bottom number (diastolic). It is best to have a blood pressure that is below 120/80. What are the causes? The cause of this condition is not known. Some other conditions can lead to high blood pressure. What increases the risk? Some lifestyle factors can make you more likely to develop high blood pressure: Smoking. Not getting enough exercise or physical activity. Being overweight. Having too much fat, sugar, calories, or salt (sodium) in your diet. Drinking too much alcohol. Other risk factors include: Having any of these conditions: Heart disease. Diabetes. High cholesterol. Kidney disease. Obstructive sleep apnea. Having a family history of high blood pressure and high cholesterol. Age. The risk increases with age. Stress. What are the signs or symptoms? High blood pressure may not cause symptoms. Very high blood pressure (hypertensive crisis) may cause: Headache. Fast or uneven heartbeats (palpitations). Shortness of breath. Nosebleed. Vomiting or feeling like you may vomit (nauseous). Changes in how you see. Very bad chest pain. Feeling dizzy. Seizures. How is this treated? This condition is treated by making healthy lifestyle changes, such as: Eating healthy foods. Exercising more. Drinking less alcohol. Your doctor may prescribe medicine if lifestyle changes do not help enough and if: Your top number is above 130. Your bottom number is above 80. Your personal target blood pressure may vary. Follow these instructions at home: Eating and drinking  If told, follow the DASH eating plan. To follow this plan: Fill one half of your plate at each meal with fruits and vegetables. Fill one fourth of your plate  at each meal with whole grains. Whole grains include whole-wheat pasta, brown rice, and whole-grain bread. Eat or drink low-fat dairy products, such as skim milk or low-fat yogurt. Fill one fourth of your plate at each meal with low-fat (lean) proteins. Low-fat proteins include fish, chicken without skin, eggs, beans, and tofu. Avoid fatty meat, cured and processed meat, or chicken with skin. Avoid pre-made or processed food. Limit the amount of salt in your diet to less than 1,500 mg each day. Do not drink alcohol if: Your doctor tells you not to drink. You are pregnant, may be pregnant, or are planning to become pregnant. If you drink alcohol: Limit how much you have to: 0-1 drink a day for women. 0-2 drinks a day for men. Know how much alcohol is in your drink. In the [place] one drink equals one 12 oz bottle of beer (355 mL), one 5 oz glass of wine (148 mL), or one 1 oz glass of hard liquor (44 mL). Lifestyle  Work with your doctor to stay at a healthy weight or to lose weight. Ask your doctor what the best weight is for you. Get at least 30 minutes of exercise that causes your heart to beat faster (aerobic exercise) most days of the week. This may include walking, swimming, or biking. Get at least 30 minutes of exercise that strengthens your muscles (resistance exercise) at least 3 days a week. This may include lifting weights or doing Pilates. Do not smoke or use any products that contain nicotine or tobacco. If you need help quitting, ask your doctor. Check your blood pressure at home as told by your doctor. Keep all follow-up visits. Medicines Take over-the-counter and prescription medicines   only as told by your doctor. Follow directions carefully. Do not skip doses of blood pressure medicine. The medicine does not work as well if you skip doses. Skipping doses also puts you at risk for problems. Ask your doctor about side effects or reactions to medicines that you should watch  for. Contact a doctor if: You think you are having a reaction to the medicine you are taking. You have headaches that keep coming back. You feel dizzy. You have swelling in your ankles. You have trouble with your vision. Get help right away if: You get a very bad headache. You start to feel mixed up (confused). You feel weak or numb. You feel faint. You have very bad pain in your: Chest. Belly (abdomen). You vomit more than once. You have trouble breathing. These symptoms may be an emergency. Get help right away. Call 911. Do not wait to see if the symptoms will go away. Do not drive yourself to the hospital. Summary Hypertension is another name for high blood pressure. High blood pressure forces your heart to work harder to pump blood. For most people, a normal blood pressure is less than 120/80. Making healthy choices can help lower blood pressure. If your blood pressure does not get lower with healthy choices, you may need to take medicine. This information is not intended to replace advice given to you by your health care provider. Make sure you discuss any questions you have with your health care provider. Document Revised: 04/18/2021 Document Reviewed: 04/18/2021 Elsevier Patient Education  2024 [employer]  
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
## 2023-02-20 NOTE — Assessment & Plan Note (Signed)
Controlled off amlodipine We will monitor

## 2023-02-26 ENCOUNTER — Other Ambulatory Visit (HOSPITAL_COMMUNITY): Payer: Self-pay

## 2023-02-26 DIAGNOSIS — J301 Allergic rhinitis due to pollen: Secondary | ICD-10-CM | POA: Diagnosis not present

## 2023-02-26 DIAGNOSIS — J3081 Allergic rhinitis due to animal (cat) (dog) hair and dander: Secondary | ICD-10-CM | POA: Diagnosis not present

## 2023-02-26 DIAGNOSIS — J3089 Other allergic rhinitis: Secondary | ICD-10-CM | POA: Diagnosis not present

## 2023-03-02 ENCOUNTER — Other Ambulatory Visit (HOSPITAL_COMMUNITY): Payer: Self-pay

## 2023-03-04 ENCOUNTER — Other Ambulatory Visit (HOSPITAL_COMMUNITY): Payer: Self-pay

## 2023-03-04 ENCOUNTER — Other Ambulatory Visit: Payer: Self-pay

## 2023-03-05 DIAGNOSIS — J301 Allergic rhinitis due to pollen: Secondary | ICD-10-CM | POA: Diagnosis not present

## 2023-03-05 DIAGNOSIS — J3081 Allergic rhinitis due to animal (cat) (dog) hair and dander: Secondary | ICD-10-CM | POA: Diagnosis not present

## 2023-03-05 DIAGNOSIS — J3089 Other allergic rhinitis: Secondary | ICD-10-CM | POA: Diagnosis not present

## 2023-03-09 MED ORDER — TRIAMCINOLONE ACETONIDE 0.1 % EX CREA: 1.0000 | TOPICAL_CREAM | Freq: Two times a day (BID) | CUTANEOUS | 0 refills | Status: AC

## 2023-03-12 DIAGNOSIS — J3089 Other allergic rhinitis: Secondary | ICD-10-CM | POA: Diagnosis not present

## 2023-03-12 DIAGNOSIS — J3081 Allergic rhinitis due to animal (cat) (dog) hair and dander: Secondary | ICD-10-CM | POA: Diagnosis not present

## 2023-03-12 DIAGNOSIS — J301 Allergic rhinitis due to pollen: Secondary | ICD-10-CM | POA: Diagnosis not present

## 2023-03-16 ENCOUNTER — Other Ambulatory Visit: Payer: Self-pay | Admitting: Internal Medicine

## 2023-03-18 NOTE — Telephone Encounter (Signed)
Requested Prescriptions  Pending Prescriptions Disp Refills   TRESIBA FLEXTOUCH 100 UNIT/ML FlexTouch Pen [Pharmacy Med Name: TRESIBA FLEXTOUCH PEN 5'S 100U/ML] 15 mL 2    Sig: INJECT 12 UNITS INTO THE SKIN DAILY     Endocrinology:  Diabetes - Insulins Passed - 03/16/2023  1:44 AM      Passed - HBA1C is between 0 and 7.9 and within 180 days    Hemoglobin A1C  Date Value Ref Range Status  06/22/2014 7.5 (H) 4.2 - 6.3 % Final    Comment:    The American Diabetes Association recommends that a primary goal of therapy should be <7% and that physicians should reevaluate the treatment regimen in patients with HbA1c values consistently >8%.    HbA1c, POC (controlled diabetic range)  Date Value Ref Range Status  01/26/2023 7.0 0.0 - 7.0 % Final         Passed - Valid encounter within last 6 months    Recent Outpatient Visits           3 weeks ago Overweight with body mass index (BMI) of 27 to 27.9 in adult   [facility] [place], [place] W, NP   1 month ago Type 2 diabetes mellitus with hyperglycemia, without long-term current use of insulin [facility])   [place] [facility] [place], Salvadore Oxford, NP   4 months ago Acute cough   [place] [facility] [place], Salvadore Oxford, NP   4 months ago Encounter for general adult medical examination with abnormal findings   [place] [facility] [place], [place] W, NP   5 months ago Type 2 diabetes mellitus with hyperglycemia, without long-term current use of insulin [facility])   [place] [facility] [place], Salvadore Oxford, NP       Future Appointments             In 2 months [place] Nora, Gomez Clever[place], NP [facility], [facility]   In 4 months Baity, Salvadore Oxford, NP [facility], [facility]

## 2023-03-19 DIAGNOSIS — J3081 Allergic rhinitis due to animal (cat) (dog) hair and dander: Secondary | ICD-10-CM | POA: Diagnosis not present

## 2023-03-19 DIAGNOSIS — J301 Allergic rhinitis due to pollen: Secondary | ICD-10-CM | POA: Diagnosis not present

## 2023-03-19 DIAGNOSIS — J3089 Other allergic rhinitis: Secondary | ICD-10-CM | POA: Diagnosis not present

## 2023-03-23 ENCOUNTER — Other Ambulatory Visit: Payer: Self-pay | Admitting: Internal Medicine

## 2023-03-24 ENCOUNTER — Encounter (HOSPITAL_COMMUNITY): Payer: Self-pay | Admitting: Pharmacist

## 2023-03-24 ENCOUNTER — Other Ambulatory Visit (HOSPITAL_COMMUNITY): Payer: Self-pay

## 2023-03-24 NOTE — Telephone Encounter (Signed)
D/C 01/26/23. Requested Prescriptions  Refused Prescriptions Disp Refills   amLODipine (NORVASC) 5 MG tablet [Pharmacy Med Name: AMLODIPINE BESYLATE TABS 5MG ] 90 tablet 3    Sig: TAKE 1 TABLET DAILY     Cardiovascular: Calcium Channel Blockers 2 Passed - 03/23/2023  1:43 AM      Passed - Last BP in normal range    BP Readings from Last 1 Encounters:  02/20/23 124/78         Passed - Last Heart Rate in normal range    Pulse Readings from Last 1 Encounters:  02/20/23 82         Passed - Valid encounter within last 6 months    Recent Outpatient Visits           1 month ago Overweight with body mass index (BMI) of 27 to 27.9 in adult   [place] [place], [place], NP   1 month ago Type 2 diabetes mellitus with hyperglycemia, without long-term current use of insulin [facility])   [place] [facility] [place], Salvadore Oxford, NP   4 months ago Acute cough   [place] [facility] [place], Salvadore Oxford, NP   4 months ago Encounter for general adult medical examination with abnormal findings   [place] [facility] [place], [place] W, NP   6 months ago Type 2 diabetes mellitus with hyperglycemia, without long-term current use of insulin [facility])   [place] [facility] [place], Salvadore Oxford, NP       Future Appointments             In 2 months Durwin Nora, Gomez Cleverly, NP [facility], [facility]   In 4 months Baity, Salvadore Oxford, NP [facility], [facility]

## 2023-03-26 ENCOUNTER — Other Ambulatory Visit (HOSPITAL_COMMUNITY): Payer: Self-pay

## 2023-03-26 DIAGNOSIS — J3089 Other allergic rhinitis: Secondary | ICD-10-CM | POA: Diagnosis not present

## 2023-03-26 DIAGNOSIS — J3081 Allergic rhinitis due to animal (cat) (dog) hair and dander: Secondary | ICD-10-CM | POA: Diagnosis not present

## 2023-03-26 DIAGNOSIS — J301 Allergic rhinitis due to pollen: Secondary | ICD-10-CM | POA: Diagnosis not present

## 2023-03-30 ENCOUNTER — Other Ambulatory Visit: Payer: Self-pay

## 2023-03-31 ENCOUNTER — Other Ambulatory Visit (HOSPITAL_COMMUNITY): Payer: Self-pay

## 2023-03-31 ENCOUNTER — Other Ambulatory Visit: Payer: Self-pay

## 2023-04-02 ENCOUNTER — Other Ambulatory Visit (HOSPITAL_COMMUNITY): Payer: Self-pay

## 2023-04-02 DIAGNOSIS — J3089 Other allergic rhinitis: Secondary | ICD-10-CM | POA: Diagnosis not present

## 2023-04-02 DIAGNOSIS — J3081 Allergic rhinitis due to animal (cat) (dog) hair and dander: Secondary | ICD-10-CM | POA: Diagnosis not present

## 2023-04-02 DIAGNOSIS — J301 Allergic rhinitis due to pollen: Secondary | ICD-10-CM | POA: Diagnosis not present

## 2023-04-03 DIAGNOSIS — E119 Type 2 diabetes mellitus without complications: Secondary | ICD-10-CM | POA: Diagnosis not present

## 2023-04-03 DIAGNOSIS — H524 Presbyopia: Secondary | ICD-10-CM | POA: Diagnosis not present

## 2023-04-03 DIAGNOSIS — H40013 Open angle with borderline findings, low risk, bilateral: Secondary | ICD-10-CM | POA: Diagnosis not present

## 2023-04-03 DIAGNOSIS — H18613 Keratoconus, stable, bilateral: Secondary | ICD-10-CM | POA: Diagnosis not present

## 2023-04-08 LAB — HM DIABETES EYE EXAM

## 2023-04-09 DIAGNOSIS — J3089 Other allergic rhinitis: Secondary | ICD-10-CM | POA: Diagnosis not present

## 2023-04-09 DIAGNOSIS — J3081 Allergic rhinitis due to animal (cat) (dog) hair and dander: Secondary | ICD-10-CM | POA: Diagnosis not present

## 2023-04-09 DIAGNOSIS — J301 Allergic rhinitis due to pollen: Secondary | ICD-10-CM | POA: Diagnosis not present

## 2023-04-16 DIAGNOSIS — J301 Allergic rhinitis due to pollen: Secondary | ICD-10-CM | POA: Diagnosis not present

## 2023-04-16 DIAGNOSIS — J3089 Other allergic rhinitis: Secondary | ICD-10-CM | POA: Diagnosis not present

## 2023-04-16 DIAGNOSIS — J3081 Allergic rhinitis due to animal (cat) (dog) hair and dander: Secondary | ICD-10-CM | POA: Diagnosis not present

## 2023-04-17 ENCOUNTER — Encounter: Payer: Self-pay | Admitting: Internal Medicine

## 2023-04-20 ENCOUNTER — Other Ambulatory Visit: Payer: Self-pay

## 2023-04-23 DIAGNOSIS — J3089 Other allergic rhinitis: Secondary | ICD-10-CM | POA: Diagnosis not present

## 2023-04-23 DIAGNOSIS — J3081 Allergic rhinitis due to animal (cat) (dog) hair and dander: Secondary | ICD-10-CM | POA: Diagnosis not present

## 2023-04-23 DIAGNOSIS — J301 Allergic rhinitis due to pollen: Secondary | ICD-10-CM | POA: Diagnosis not present

## 2023-04-24 ENCOUNTER — Other Ambulatory Visit: Payer: Self-pay

## 2023-04-27 ENCOUNTER — Other Ambulatory Visit: Payer: Self-pay

## 2023-04-27 NOTE — Progress Notes (Signed)
Specialty Pharmacy Refill Coordination Note  [name] is a 67 y.o. female contacted today regarding refills of specialty medication(s) Dolutegravir-Lamivudine   Patient requested Delivery   Delivery date: 04/30/23   Verified address: [address]  [name] [address]   Medication will be filled on 04/29/23.

## 2023-04-30 DIAGNOSIS — J301 Allergic rhinitis due to pollen: Secondary | ICD-10-CM | POA: Diagnosis not present

## 2023-04-30 DIAGNOSIS — J3089 Other allergic rhinitis: Secondary | ICD-10-CM | POA: Diagnosis not present

## 2023-04-30 DIAGNOSIS — J3081 Allergic rhinitis due to animal (cat) (dog) hair and dander: Secondary | ICD-10-CM | POA: Diagnosis not present

## 2023-05-07 DIAGNOSIS — J3081 Allergic rhinitis due to animal (cat) (dog) hair and dander: Secondary | ICD-10-CM | POA: Diagnosis not present

## 2023-05-07 DIAGNOSIS — J3089 Other allergic rhinitis: Secondary | ICD-10-CM | POA: Diagnosis not present

## 2023-05-07 DIAGNOSIS — J301 Allergic rhinitis due to pollen: Secondary | ICD-10-CM | POA: Diagnosis not present

## 2023-05-14 DIAGNOSIS — J301 Allergic rhinitis due to pollen: Secondary | ICD-10-CM | POA: Diagnosis not present

## 2023-05-14 DIAGNOSIS — J3081 Allergic rhinitis due to animal (cat) (dog) hair and dander: Secondary | ICD-10-CM | POA: Diagnosis not present

## 2023-05-14 DIAGNOSIS — J3089 Other allergic rhinitis: Secondary | ICD-10-CM | POA: Diagnosis not present

## 2023-05-18 ENCOUNTER — Ambulatory Visit (INDEPENDENT_AMBULATORY_CARE_PROVIDER_SITE_OTHER): Payer: BC Managed Care – PPO | Admitting: Family Medicine

## 2023-05-18 ENCOUNTER — Encounter: Payer: Self-pay | Admitting: Family Medicine

## 2023-05-18 VITALS — BP 150/88 | HR 78 | Ht 62.0 in | Wt 158.0 lb

## 2023-05-18 DIAGNOSIS — M5441 Lumbago with sciatica, right side: Secondary | ICD-10-CM

## 2023-05-18 DIAGNOSIS — M5442 Lumbago with sciatica, left side: Secondary | ICD-10-CM | POA: Diagnosis not present

## 2023-05-18 MED ORDER — MELOXICAM 15 MG PO TABS: 15.0000 mg | ORAL_TABLET | Freq: Every day | ORAL | 0 refills | Status: DC | PRN

## 2023-05-18 MED ORDER — KETOROLAC TROMETHAMINE 30 MG/ML IJ SOLN
30.0000 mg | Freq: Once | INTRAMUSCULAR | Status: AC
  Administered 2023-05-18: 30 mg via INTRAMUSCULAR

## 2023-05-18 MED ORDER — CYCLOBENZAPRINE HCL 10 MG PO TABS: 10.0000 mg | ORAL_TABLET | Freq: Three times a day (TID) | ORAL | 0 refills | Status: DC | PRN

## 2023-05-18 MED ORDER — PREDNISONE 20 MG PO TABS: ORAL_TABLET | ORAL | 0 refills | Status: DC

## 2023-05-18 NOTE — Progress Notes (Signed)
Subjective:    Patient ID: [name], female    DOB: 22-Sep-1955, 67 y.o.   MRN: [id number]  [name] is a 66 y.o. female presenting on 05/18/2023 for Back Pain (Started Thursday, worsening, difficulty sitting, standing and walking) and Leg Pain (Bilateral leg pain)  Patient presents for a same day appointment.  PCP Nicki Reaper, FNP   HPI  Discussed the use of AI scribe software for clinical note transcription with the patient, who gave verbal consent to proceed.      Acute Back Pain, with sciatica bilateral  - Reports symptoms started about 5 days ago without inciting injury known. Today seems to be worsening gradually without improvement. Describes pain as moderate to severe in low back gluteal and bilateral legs with sciatica persistent symptoms without resolution. Keeping her up at night as well difficulty sleeping.  Tried Aleve and tylenol without relief - Tried heating pad without relief  No prior history of back or spine surgery No prior imaging available  - Prior similar back pain flares improved with medications. - Admits difficulty sleeping due to pain Previously on Gabapentin without results - Denies any fevers/chills, numbness, tingling, weakness, loss of control bladder/bowel incontinence or retention, unintentional wt loss, night sweats       01/26/2023    9:03 AM 10/27/2022    8:48 AM 09/22/2022   10:15 AM  Depression screen PHQ 2/9  Decreased Interest 1 0 0  Down, Depressed, Hopeless 1 0 1  PHQ - 2 Score 2 0 1  Altered sleeping 0 0 0  Tired, decreased energy 0 0 1  Change in appetite 3 0 0  Feeling bad or failure about yourself  0 0 0  Trouble concentrating 0 0 0  Moving slowly or fidgety/restless 0 0 0  Suicidal thoughts 0 0 0  PHQ-9 Score 5 0 2  Difficult doing work/chores Not difficult at all Not difficult at all Not difficult at all    Social History   Tobacco Use   Smoking status: Never   Smokeless tobacco: Never  Vaping  Use   Vaping status: Never Used  Substance Use Topics   Alcohol use: Not Currently    Alcohol/week: 0.0 standard drinks of alcohol   Drug use: No    Review of Systems Per HPI unless specifically indicated above     Objective:    BP (!) 150/88   Pulse 78   Ht 5\' 2"  (1.575 m)   Wt 158 lb (71.7 kg)   BMI 28.90 kg/m   Wt Readings from Last 3 Encounters:  05/18/23 158 lb (71.7 kg)  01/26/23 149 lb (67.6 kg)  11/26/22 148 lb (67.1 kg)    Physical Exam Vitals and nursing note reviewed.  Constitutional:      General: She is not in acute distress.    Appearance: Normal appearance. She is well-developed. She is not diaphoretic.     Comments: Well-appearing, uncomfortable due to back pain, cooperative  HENT:     Head: Normocephalic and atraumatic.  Eyes:     General:        Right eye: No discharge.        Left eye: No discharge.     Conjunctiva/sclera: Conjunctivae normal.  Cardiovascular:     Rate and Rhythm: Normal rate.  Pulmonary:     Effort: Pulmonary effort is normal.  Musculoskeletal:     Comments: Bilateral lower back paraspinal lumbar muscles with hypertonicity SLR positive bilateral radicular symptoms  Skin:  General: Skin is warm and dry.     Findings: No erythema or rash.  Neurological:     Mental Status: She is alert and oriented to person, place, and time.  Psychiatric:        Mood and Affect: Mood normal.        Behavior: Behavior normal.        Thought Content: Thought content normal.     Comments: Well groomed, good eye contact, normal speech and thoughts      Results for orders placed or performed in visit on 04/17/23  HM DIABETES EYE EXAM  Result Value Ref Range   HM Diabetic Eye Exam No Retinopathy No Retinopathy      Assessment & Plan:   Problem List Items Addressed This Visit   None Visit Diagnoses     Acute bilateral low back pain with bilateral sciatica    -  Primary   Relevant Medications   ketorolac (TORADOL) 30 MG/ML injection  30 mg (Completed)   predniSONE (DELTASONE) 20 MG tablet   cyclobenzaprine (FLEXERIL) 10 MG tablet   meloxicam (MOBIC) 15 MG tablet          Acute Bilateral Low Back Pain with Sciatica Severe pain started 5 days ago, similar but worse than previous episodes. No recent injury or imaging. Current medications (methocarbamol, over-the-counter analgesics) not providing relief. Pain is persistent, affecting sleep and ability to work. -Administer Toradol injection 30mg  today for immediate relief. -Start Prednisone taper for 7 days. Avoid NSAIDs while on Prednisone. -After Prednisone, start Meloxicam 15mg  daily AS NEEDED for 2-4 weeks -Switch muscle relaxant to Flexeril (DC Methocarbamol) -Work note for 3 days off. -If no improvement, consider imaging and stronger medications with primary care provider Rene Kocher).        Meds ordered this encounter  Medications   ketorolac (TORADOL) 30 MG/ML injection 30 mg   predniSONE (DELTASONE) 20 MG tablet    Sig: Take daily with food. Start with 60mg  (3 pills) x 2 days, then reduce to 40mg  (2 pills) x 2 days, then 20mg  (1 pill) x 3 days    Dispense:  13 tablet    Refill:  0   cyclobenzaprine (FLEXERIL) 10 MG tablet    Sig: Take 1 tablet (10 mg total) by mouth 3 (three) times daily as needed for muscle spasms.    Dispense:  30 tablet    Refill:  0   meloxicam (MOBIC) 15 MG tablet    Sig: Take 1 tablet (15 mg total) by mouth daily as needed for pain. For up to 2 weeks then as needed    Dispense:  30 tablet    Refill:  0     Follow up plan: Return in about 1 week (around 05/25/2023), or if symptoms worsen or fail to improve, for back pain.   Saralyn Pilar, DO [facility] 05/18/2023, 2:10 PM

## 2023-05-18 NOTE — Patient Instructions (Addendum)
Thank you for coming to the office today.  1. For your Back Pain - I think that this is due to Muscle Spasms or strain. Your Sciatic Nerve can be affected causing some of your radiation and numbness down your legs.  Toradol injection (anti inflammatory)  Start Prednisone taper 7 days.  While on the Prednisone, you cannot take Aleve or Ibuprofen or Advil.  When Finished with prednisone - take once daily Meloxicam 15mg  daily for 2 weeks, this is an anti inflammatory again cannot take Aleve, Ibuprofen Advil.  Switch Methocarbamol to Flexeril 10mg  take up to 3 times per day max as needed.  May use Tylenol Extra Str 500mg  tabs - may take 1-2 tablets every 6 hours as needed  Recommend to start using heating pad on your lower back 1-2x daily for few weeks  Also try a Wedge Seat Cushion to avoid nerve pinching when sitting prolonged period of time.  This pain may take weeks to months to fully resolve, but hopefully it will respond to the medicine initially. All back injuries (small or serious) are slow to heal since we use our back muscles every day. Be careful with turning, twisting, lifting, sitting / standing for prolonged periods, and avoid re-injury.  If your symptoms significantly worsen with more pain, or new symptoms with weakness in one or both legs, new or different shooting leg pains, numbness in legs or groin, loss of control or retention of urine or bowel movements, please call back for advice and you may need to go directly to the Emergency Department.   Please schedule a Follow-up Appointment to: Return in about 1 week (around 05/25/2023), or if symptoms worsen or fail to improve, for back pain.  If you have any other questions or concerns, please feel free to call the office or send a message through MyChart. You may also schedule an earlier appointment if necessary.  Additionally, you may be receiving a survey about your experience at our office within a few days to 1 week by  e-mail or mail. We value your feedback.  Saralyn Pilar, DO [facility], [facility]             Low Back Pain Exercises  See other page with pictures of each exercise.  Start with 1 or 2 of these exercises that you are most comfortable with. Do not do any exercises that cause you significant worsening pain. Some of these may cause some "stretching soreness" but it should go away after you stop the exercise, and get better over time. Gradually increase up to 3-4 exercises as tolerated.  Standing hamstring stretch: Place the heel of your leg on a stool about 15 inches high. Keep your knee straight. Lean forward, bending at the hips until you feel a mild stretch in the back of your thigh. Make sure you do not roll your shoulders and bend at the waist when doing this or you will stretch your lower back instead. Hold the stretch for 15 to 30 seconds. Repeat 3 times. Repeat the same stretch on your other leg.  Cat and camel: Get down on your hands and knees. Let your stomach sag, allowing your back to curve downward. Hold this position for 5 seconds. Then arch your back and hold for 5 seconds. Do 3 sets of 10.  Quadriped Arm/Leg Raises: Get down on your hands and knees. Tighten your abdominal muscles to stiffen your spine. While keeping your abdominals tight, raise one arm and the opposite leg  away from you. Hold this position for 5 seconds. Lower your arm and leg slowly and alternate sides. Do this 10 times on each side.  Pelvic tilt: Lie on your back with your knees bent and your feet flat on the floor. Tighten your abdominal muscles and push your lower back into the floor. Hold this position for 5 seconds, then relax. Do 3 sets of 10.  Partial curl: Lie on your back with your knees bent and your feet flat on the floor. Tighten your stomach muscles and flatten your back against the floor. Tuck your chin to your chest. With your hands stretched out in front of you, curl  your upper body forward until your shoulders clear the floor. Hold this position for 3 seconds. Don't hold your breath. It helps to breathe out as you lift your shoulders up. Relax. Repeat 10 times. Build to 3 sets of 10. To challenge yourself, clasp your hands behind your head and keep your elbows out to the side.  Lower trunk rotation: Lie on your back with your knees bent and your feet flat on the floor. Tighten your abdominal muscles and push your lower back into the floor. Keeping your shoulders down flat, gently rotate your legs to one side, then the other as far as you can. Repeat 10 to 20 times.  Single knee to chest stretch: Lie on your back with your legs straight out in front of you. Bring one knee up to your chest and grasp the back of your thigh. Pull your knee toward your chest, stretching your buttock muscle. Hold this position for 15 to 30 seconds and return to the starting position. Repeat 3 times on each side.  Double knee to chest: Lie on your back with your knees bent and your feet flat on the floor. Tighten your abdominal muscles and push your lower back into the floor. Pull both knees up to your chest. Hold for 5 seconds and repeat 10 to 20 times.

## 2023-05-19 ENCOUNTER — Other Ambulatory Visit: Payer: Self-pay | Admitting: Internal Medicine

## 2023-05-19 NOTE — Telephone Encounter (Signed)
Requested medications are due for refill today.  yes  Requested medications are on the active medications list.  yes  Last refill. 01/12/2023 #30 0 rf  Future visit scheduled.   yes  Notes to clinic.  Pt has been out of this medication for awhile. Please review for refill.    Requested Prescriptions  Pending Prescriptions Disp Refills   KLOR-CON M20 20 MEQ tablet [Pharmacy Med Name: POTASSIUM CHLORIDE ER (DISP) TABS 20MEQ] 90 tablet 3    Sig: TAKE 1 TABLET DAILY     Endocrinology:  Minerals - Potassium Supplementation Failed - 05/19/2023 12:18 AM      Failed - Cr in normal range and within 360 days    Creat  Date Value Ref Range Status  01/26/2023 1.16 (H) 0.50 - 1.05 mg/dL Final   Creatinine,U  Date Value Ref Range Status  09/25/2020 145.4 mg/dL Final   Creatinine, Urine  Date Value Ref Range Status  10/30/2022 215 20 - 275 mg/dL Final         Passed - K in normal range and within 360 days    Potassium  Date Value Ref Range Status  01/26/2023 3.9 3.5 - 5.3 mmol/L Final  06/23/2014 4.2 3.5 - 5.1 mmol/L Final         Passed - Valid encounter within last 12 months    Recent Outpatient Visits           Yesterday Acute bilateral low back pain with bilateral sciatica   [place] [facility] Smitty Cords, DO   2 months ago Overweight with body mass index (BMI) of 27 to 27.9 in adult   [facility] [place], [place], NP   3 months ago Type 2 diabetes mellitus with hyperglycemia, without long-term current use of insulin [facility])   [place] [facility] [place], Salvadore Oxford, NP   6 months ago Acute cough   [place] [facility] [place], Salvadore Oxford, NP   6 months ago Encounter for general adult medical examination with abnormal findings   [place] [facility] [place], Salvadore Oxford, NP       Future Appointments             In 6 days [place] Nora, Gomez Cleverly, NP [facility]  Infect Dis - A Dept Of [place], [facility]   In 2 months Pope, Salvadore Oxford, NP [place] [facility], [place]

## 2023-05-21 ENCOUNTER — Other Ambulatory Visit (HOSPITAL_COMMUNITY): Payer: Self-pay

## 2023-05-21 ENCOUNTER — Other Ambulatory Visit: Payer: Self-pay

## 2023-05-21 DIAGNOSIS — J301 Allergic rhinitis due to pollen: Secondary | ICD-10-CM | POA: Diagnosis not present

## 2023-05-21 DIAGNOSIS — R052 Subacute cough: Secondary | ICD-10-CM | POA: Diagnosis not present

## 2023-05-21 DIAGNOSIS — J3089 Other allergic rhinitis: Secondary | ICD-10-CM | POA: Diagnosis not present

## 2023-05-21 DIAGNOSIS — J3081 Allergic rhinitis due to animal (cat) (dog) hair and dander: Secondary | ICD-10-CM | POA: Diagnosis not present

## 2023-05-21 DIAGNOSIS — H1045 Other chronic allergic conjunctivitis: Secondary | ICD-10-CM | POA: Diagnosis not present

## 2023-05-21 NOTE — Progress Notes (Signed)
Specialty Pharmacy Ongoing Clinical Assessment Note  [name] is a 67 y.o. female who is being followed by the specialty pharmacy service for RxSp HIV   Patient's specialty medication(s) reviewed today: Dolutegravir-Lamivudine   Missed doses in the last 4 weeks: 0   Patient/Caregiver did not have any additional questions or concerns.   Therapeutic benefit summary: Patient is achieving benefit   Adverse events/side effects summary: No adverse events/side effects   Patient's therapy is appropriate to: Continue    Goals Addressed             This Visit's Progress    Achieve Undetectable HIV Viral Load < 20       Patient is on track. Patient will maintain adherence         Follow up:  6 months  Bobette Mo Specialty Pharmacist

## 2023-05-21 NOTE — Progress Notes (Signed)
Specialty Pharmacy Refill Coordination Note  [name] is a 67 y.o. female contacted today regarding refills of specialty medication(s) Dolutegravir-Lamivudine   Patient requested Delivery   Delivery date: 05/28/23   Verified address: [address]   Medication will be filled on 05/27/23.

## 2023-05-25 ENCOUNTER — Ambulatory Visit: Payer: BC Managed Care – PPO | Admitting: Infectious Diseases

## 2023-05-27 ENCOUNTER — Other Ambulatory Visit: Payer: Self-pay

## 2023-05-28 ENCOUNTER — Ambulatory Visit
Admission: RE | Admit: 2023-05-28 | Discharge: 2023-05-28 | Disposition: A | Discharge status: Home / Self Care | Payer: BC Managed Care – PPO | Attending: Internal Medicine | Admitting: Internal Medicine

## 2023-05-28 ENCOUNTER — Ambulatory Visit (INDEPENDENT_AMBULATORY_CARE_PROVIDER_SITE_OTHER): Payer: BC Managed Care – PPO | Admitting: Internal Medicine

## 2023-05-28 ENCOUNTER — Encounter: Payer: Self-pay | Admitting: Internal Medicine

## 2023-05-28 ENCOUNTER — Ambulatory Visit
Admission: RE | Admit: 2023-05-28 | Discharge: 2023-05-28 | Disposition: A | Discharge status: Home / Self Care | Payer: BC Managed Care – PPO | Source: Ambulatory Visit | Attending: Internal Medicine | Admitting: Internal Medicine

## 2023-05-28 VITALS — BP 128/84 | Ht 62.0 in | Wt 160.0 lb

## 2023-05-28 DIAGNOSIS — M5442 Lumbago with sciatica, left side: Secondary | ICD-10-CM

## 2023-05-28 DIAGNOSIS — M5441 Lumbago with sciatica, right side: Secondary | ICD-10-CM | POA: Insufficient documentation

## 2023-05-28 DIAGNOSIS — J3089 Other allergic rhinitis: Secondary | ICD-10-CM | POA: Diagnosis not present

## 2023-05-28 DIAGNOSIS — J301 Allergic rhinitis due to pollen: Secondary | ICD-10-CM | POA: Diagnosis not present

## 2023-05-28 DIAGNOSIS — J3081 Allergic rhinitis due to animal (cat) (dog) hair and dander: Secondary | ICD-10-CM | POA: Diagnosis not present

## 2023-05-28 MED ORDER — KETOROLAC TROMETHAMINE 30 MG/ML IJ SOLN
30.0000 mg | Freq: Once | INTRAMUSCULAR | Status: AC
  Administered 2023-05-28: 30 mg via INTRAMUSCULAR
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
## 2023-05-28 NOTE — Addendum Note (Signed)
Addended by: Luanna Cole D on: 05/28/2023 03:42 PM   Modules accepted: Orders

## 2023-05-28 NOTE — Patient Instructions (Signed)

## 2023-06-02 ENCOUNTER — Ambulatory Visit: Payer: BC Managed Care – PPO | Admitting: Infectious Diseases

## 2023-06-04 ENCOUNTER — Other Ambulatory Visit: Payer: Self-pay

## 2023-06-04 ENCOUNTER — Encounter: Payer: Self-pay | Admitting: Physical Therapy

## 2023-06-04 ENCOUNTER — Ambulatory Visit: Payer: BC Managed Care – PPO | Attending: Internal Medicine | Admitting: Physical Therapy

## 2023-06-04 DIAGNOSIS — J301 Allergic rhinitis due to pollen: Secondary | ICD-10-CM | POA: Diagnosis not present

## 2023-06-04 DIAGNOSIS — J3081 Allergic rhinitis due to animal (cat) (dog) hair and dander: Secondary | ICD-10-CM | POA: Diagnosis not present

## 2023-06-04 DIAGNOSIS — J3089 Other allergic rhinitis: Secondary | ICD-10-CM | POA: Diagnosis not present

## 2023-06-04 DIAGNOSIS — M5442 Lumbago with sciatica, left side: Secondary | ICD-10-CM | POA: Insufficient documentation

## 2023-06-04 DIAGNOSIS — M5441 Lumbago with sciatica, right side: Secondary | ICD-10-CM | POA: Insufficient documentation

## 2023-06-04 NOTE — Therapy (Signed)
OUTPATIENT PHYSICAL THERAPY THORACOLUMBAR EVALUATION   Patient Name: [name] MRN: [id number] DOB:05-15-1956, 67 y.o., female Today's Date: 06/04/2023  END OF SESSION:  PT End of Session - 06/04/23 1423     Visit Number 1    Number of Visits 20    Date for PT Re-Evaluation 08/13/23    Authorization Type [employer]    Authorization - Visit Number 1    Authorization - Number of Visits 20    Progress Note Due on Visit 10    PT Start Time 1345    PT Stop Time 1430    PT Time Calculation (min) 45 min    Activity Tolerance Patient tolerated treatment well    Behavior During Therapy WFL for tasks assessed/performed             Past Medical History:  Diagnosis Date   Arthritis    fingers   Asthma    no trouble lately   Diabetes mellitus without complication (HCC) 11/29/2012   hx. NIDDM-dx. 3 weeks ago   Headache    migraines - none since stroke   HIV (human immunodeficiency virus infection) (HCC)    Hyperlipidemia    hx   Hypertension    Kidney disease    staged three   Seasonal allergies    has cough   Stroke (HCC) 02/18/2018   Residual Left side weakness and speech issues   Ulcerative colitis (HCC)    Vertigo    benign   Wears contact lenses    Wears partial dentures    upper/ has lower but doesn't wear   Past Surgical History:  Procedure Laterality Date   BUNIONECTOMY     bilateral and toe nails of big toes removed   CESAREAN SECTION  1997/2006   CHOLECYSTECTOMY N/A 12/01/2012   Procedure: LAPAROSCOPIC CHOLECYSTECTOMY;  Surgeon: Almond Lint, MD;  Location: [facility];  Service: General;  Laterality: N/A;   COLONOSCOPY W/ POLYPECTOMY     COLONOSCOPY WITH PROPOFOL N/A 11/17/2022   Procedure: COLONOSCOPY WITH BIOPSY;  Surgeon: Midge Minium, MD;  Location: [facility];  Service: Endoscopy;  Laterality: N/A;   EYE SURGERY Bilateral 2013   MULTIPLE TOOTH EXTRACTIONS     POLYPECTOMY N/A 11/17/2022   Procedure: POLYPECTOMY;  Surgeon: Midge Minium,  MD;  Location: [facility];  Service: Endoscopy;  Laterality: N/A;  CLIP X 1 PLACED AT CECAL POLYP REMOVAL SITE   RADIOLOGY WITH ANESTHESIA Left 09/09/2018   Procedure: MRI WITH ANESTHESIA LEFT SHOULDER WITH CONTRAST;  Surgeon: Radiologist, Medication, MD;  Location: [facility];  Service: Radiology;  Laterality: Left;   SHOULDER ARTHROSCOPY WITH ROTATOR CUFF REPAIR AND SUBACROMIAL DECOMPRESSION Left 11/30/2018   Procedure: SHOULDER ARTHROSCOPY WITH SUBACROMIAL DECOMPRESSION AND DISTAL CLAVICAL EXCISION;  Surgeon: Juanell Fairly, MD;  Location: [facility];  Service: Orthopedics;  Laterality: Left;  Diabetic - oral meds   TONSILLECTOMY     TUBAL LIGATION     Patient Active Problem List   Diagnosis Date Noted   History of CVA (cerebrovascular accident) 01/26/2023   Hemiparesis affecting left side as late effect of cerebrovascular accident (HCC) 07/10/2021   HTN (hypertension) 03/21/2021   Eczema 03/21/2021   CKD (chronic kidney disease) stage 3, GFR 30-59 ml/min (HCC) 03/21/2021   Overweight with body mass index (BMI) of 27 to 27.9 in adult 03/21/2021   Migraine 02/23/2018   Asymptomatic HIV infection, with no history of HIV-related illness (HCC)    Hyperlipidemia associated with type 2 diabetes mellitus (HCC)  08/02/2014   DM (diabetes mellitus), type 2 (HCC) 07/31/2014   Ulcerative colitis (HCC) 06/26/2008   Depression, recurrent (HCC) 04/04/2008    PCP: Nicki Reaper NP   REFERRING PROVIDER: Nicki Reaper NP   REFERRING DIAG: [phone] (ICD-10-CM) - Acute bilateral low back pain with bilateral sciatica  Rationale for Evaluation and Treatment: Rehabilitation  THERAPY DIAG:  Acute bilateral low back pain with bilateral sciatica  ONSET DATE: 05/15/2023   SUBJECTIVE:                                                                                                                                                                                           SUBJECTIVE  STATEMENT: See pertinent history   PERTINENT HISTORY:  Pt reports that her sciatica started acting up about two weeks ago. It become hard to stand, to sit, and to lay down. She recently finished her prednisone medication which helped some and she has received two steroid injections in the past month, which also helped. When she does experience increased low back pain, pt experiences sciatica mostly on her left side but it does not radiate down to her feet.   PAIN:  Are you having pain? Yes: NPRS scale: 10/10 Pain location: Lumbar spine level L1-L5  Pain description: Aching, shooting, and throbbing  Aggravating factors: Standing up from a seated position  Relieving factors: Nothing really helps; tried heating pad and lidocaine patches   PRECAUTIONS: None  RED FLAGS: None   WEIGHT BEARING RESTRICTIONS: No  FALLS:  Has patient fallen in last 6 months? No  LIVING ENVIRONMENT: Lives with: lives with their family Lives in: House/apartment Stairs: Yes: Internal: 12 steps; right side railings  Has following equipment at home: None  OCCUPATION: Programmer, applications Job   PLOF: Independent  PATIENT GOALS: Patient wants to feel less pain with her physical activity so she can walk longer distances  NEXT MD VISIT: Nothing scheduled   OBJECTIVE:  Note: Objective measures were completed at Evaluation unless otherwise noted.  VITALS: BP 142/ 72 HR 91 Sp02 99   DIAGNOSTIC FINDINGS:  Not listed   PATIENT SURVEYS:  FOTO 67/100 with target of 38    SCREENING FOR RED FLAGS: Bowel or bladder incontinence: No Spinal tumors: No Cauda equina syndrome: No Compression fracture: No Abdominal aneurysm: No  COGNITION: Overall cognitive status: Within functional limits for tasks assessed     SENSATION: WFL  MUSCLE LENGTH: Hamstrings: Right 70 deg; Left 70 deg   POSTURE: No Significant postural limitations  PALPATION: No areas TTP   LUMBAR ROM:   AROM eval  Flexion 100%   Extension 100%*  Right  lateral flexion 100%  Left lateral flexion 100%  Right rotation 100%  Left rotation 100%   (Blank rows = not tested)  LOWER EXTREMITY ROM:     Active  Right eval Left eval  Hip flexion    Hip extension    Hip abduction    Hip adduction    Hip internal rotation    Hip external rotation    Knee flexion    Knee extension    Ankle dorsiflexion    Ankle plantarflexion    Ankle inversion    Ankle eversion     (Blank rows = not tested)  LOWER EXTREMITY MMT:    MMT Right eval Left eval  Hip flexion 4 4-  Hip extension 4- 3+  Hip abduction 4- 4-  Hip adduction    Hip internal rotation    Hip external rotation    Knee flexion 4 4-  Knee extension 4 4-  Ankle dorsiflexion 4 4  Ankle plantarflexion    Ankle inversion    Ankle eversion     (Blank rows = not tested)  LUMBAR SPECIAL TESTS:  Straight leg raise test: Negative and FABER test: Negative FADIR Negative   FUNCTIONAL TESTS:  None performed   GAIT: Distance walked: 50 ft  Assistive device utilized: None Level of assistance: Complete Independence Comments: No gait deficits noted   TODAY'S TREATMENT:                                                                                                                              DATE:   06/04/23:  Seated HS Stretch 2 x 30 sec  Supine Bridges 1 x 10   Prone quad stretch 2 x 30 sec    PATIENT EDUCATION:  Education details: Form and technique for correct performance of exercise.  Person educated: Patient Education method: Explanation, Demonstration, Verbal cues, and Handouts Education comprehension: verbalized understanding, returned demonstration, and verbal cues required  HOME EXERCISE PROGRAM: Access Code: [email or web address] URL: [email or web address] Date: 06/04/2023 Prepared by: Ellin Goodie  Exercises - Seated Hamstring Stretch  - 3-4 x weekly - 3 reps - 60 sec hold - Prone Quadriceps Stretch with Strap  - 3-4 x weekly  - 3 reps - 60 sec  hold - Supine Bridge  - 3-4 x weekly - 3 sets - 10 reps  ASSESSMENT:  CLINICAL IMPRESSION: Patient is a 67 y.o. AA female who was seen today for physical therapy evaluation and treatment for bilateral sciatica. PMH includes history of left sided stroke, HTN, T2DM, and HIV. Pt likely has symptoms that are originating from left sided lumbar stenosis with pain with lumbar extension along with neurogenic claudication. She shows signs and symptoms that make her most appropriate for the movement control treatment group with mild pain and moderate disability. She demonstrates decreased hip flexibility and strength that is limiting her ability to perform walking and standing for long periods of time to grocery shop and navigate home  environment.   OBJECTIVE IMPAIRMENTS: decreased ROM, decreased strength, impaired flexibility, impaired sensation, and pain.   ACTIVITY LIMITATIONS: carrying, lifting, bending, standing, squatting, sleeping, stairs, bathing, toileting, and locomotion level  PARTICIPATION LIMITATIONS: cleaning, shopping, community activity, and yard work  PERSONAL FACTORS: Age and 3+ comorbidities: HTN, h/o left sided stroke, T2DM  are also affecting patient's functional outcome.   REHAB POTENTIAL: Good  CLINICAL DECISION MAKING: Stable/uncomplicated  EVALUATION COMPLEXITY: Low   GOALS: Goals reviewed with patient? No  SHORT TERM GOALS: Target date: 06/18/2023  PT reviewed the following HEP with patient with patient able to demonstrate a set of the following with min cuing for correction needed. PT educated patient on parameters of therex (how/when to inc/decrease intensity, frequency, rep/set range, stretch hold time, and purpose of therex) with verbalized understanding.  Baseline: NT  Goal status: INITIAL  LONG TERM GOALS: Target date: 08/13/2023  Patient will have improved function and activity level as evidenced by an increase in FOTO score by 10 points or  more.  Baseline: 67 with target of 71  Goal status: INITIAL  2.  Patient will improve hip strength by 1/3 MMT (4- to 4) for improved lumbar stability to prevent further aggravation of lumbar radicular symptoms for overall improvement in lumbar function.  Baseline: MMT Right eval Left eval  Hip flexion 4 4-  Hip extension 4- 3+  Hip abduction 4- 4-  Hip adduction    Hip internal rotation    Hip external rotation    Knee flexion 4 4-  Knee extension 4 4-  Ankle dorsiflexion 4 4  Ankle plantarflexion    Ankle inversion    Ankle eversion     (Blank rows = not tested) Goal status: INITIAL  3.  Pt will demonstrate a significant improvement in her distance of 191 feet or 58 meters for improved aerobic endurance and mobility.  Baseline: NT  Goal status: INITIAL   PLAN:  PT FREQUENCY: 1-2x/week  PT DURATION: 10 weeks  PLANNED INTERVENTIONS: 97164- PT Re-evaluation, 97110-Therapeutic exercises, 97530- Therapeutic activity, [id number]- Neuromuscular re-education, 97140- Manual therapy, [id number]- Gait training, 40981- Aquatic Therapy, 97014- Electrical stimulation (unattended), [phone]- Electrical stimulation (manual), Patient/Family education, Balance training, Stair training, Dry Needling, Joint mobilization, Joint manipulation, Spinal manipulation, Spinal mobilization, Cryotherapy, and Moist heat.  PLAN FOR NEXT SESSION: . Hip IR and ER MMT. Progress flexion based hip strengthening exercises.   Ellin Goodie PT, DPT  [facility] [address], [place], [address] Phone: [phone]   Fax:  [phone]

## 2023-06-08 ENCOUNTER — Ambulatory Visit: Payer: BC Managed Care – PPO | Admitting: Physical Therapy

## 2023-06-08 DIAGNOSIS — M5441 Lumbago with sciatica, right side: Secondary | ICD-10-CM | POA: Diagnosis not present

## 2023-06-08 DIAGNOSIS — M5442 Lumbago with sciatica, left side: Secondary | ICD-10-CM | POA: Diagnosis not present

## 2023-06-08 NOTE — Therapy (Addendum)
OUTPATIENT PHYSICAL THERAPY THORACOLUMBAR TREATMENT   Patient Name: [name] MRN: [id number] DOB:1955-10-02, 67 y.o., female Today's Date: 06/08/2023  END OF SESSION:  PT End of Session - 06/08/23 1333     Visit Number 2    Number of Visits 20    Date for PT Re-Evaluation 08/13/23    Authorization Type [employer]    Authorization - Visit Number 2    Authorization - Number of Visits 20    Progress Note Due on Visit 10    PT Start Time 1110    PT Stop Time 1150    PT Time Calculation (min) 40 min    Activity Tolerance Patient tolerated treatment well    Behavior During Therapy WFL for tasks assessed/performed              Past Medical History:  Diagnosis Date   Arthritis    fingers   Asthma    no trouble lately   Diabetes mellitus without complication (HCC) 11/29/2012   hx. NIDDM-dx. 3 weeks ago   Headache    migraines - none since stroke   HIV (human immunodeficiency virus infection) (HCC)    Hyperlipidemia    hx   Hypertension    Kidney disease    staged three   Seasonal allergies    has cough   Stroke (HCC) 02/18/2018   Residual Left side weakness and speech issues   Ulcerative colitis (HCC)    Vertigo    benign   Wears contact lenses    Wears partial dentures    upper/ has lower but doesn't wear   Past Surgical History:  Procedure Laterality Date   BUNIONECTOMY     bilateral and toe nails of big toes removed   CESAREAN SECTION  1997/2006   CHOLECYSTECTOMY N/A 12/01/2012   Procedure: LAPAROSCOPIC CHOLECYSTECTOMY;  Surgeon: Almond Lint, MD;  Location: [facility];  Service: General;  Laterality: N/A;   COLONOSCOPY W/ POLYPECTOMY     COLONOSCOPY WITH PROPOFOL N/A 11/17/2022   Procedure: COLONOSCOPY WITH BIOPSY;  Surgeon: Midge Minium, MD;  Location: [facility];  Service: Endoscopy;  Laterality: N/A;   EYE SURGERY Bilateral 2013   MULTIPLE TOOTH EXTRACTIONS     POLYPECTOMY N/A 11/17/2022   Procedure: POLYPECTOMY;  Surgeon: Midge Minium,  MD;  Location: [facility];  Service: Endoscopy;  Laterality: N/A;  CLIP X 1 PLACED AT CECAL POLYP REMOVAL SITE   RADIOLOGY WITH ANESTHESIA Left 09/09/2018   Procedure: MRI WITH ANESTHESIA LEFT SHOULDER WITH CONTRAST;  Surgeon: Radiologist, Medication, MD;  Location: [facility];  Service: Radiology;  Laterality: Left;   SHOULDER ARTHROSCOPY WITH ROTATOR CUFF REPAIR AND SUBACROMIAL DECOMPRESSION Left 11/30/2018   Procedure: SHOULDER ARTHROSCOPY WITH SUBACROMIAL DECOMPRESSION AND DISTAL CLAVICAL EXCISION;  Surgeon: Juanell Fairly, MD;  Location: [facility];  Service: Orthopedics;  Laterality: Left;  Diabetic - oral meds   TONSILLECTOMY     TUBAL LIGATION     Patient Active Problem List   Diagnosis Date Noted   History of CVA (cerebrovascular accident) 01/26/2023   Hemiparesis affecting left side as late effect of cerebrovascular accident (HCC) 07/10/2021   HTN (hypertension) 03/21/2021   Eczema 03/21/2021   CKD (chronic kidney disease) stage 3, GFR 30-59 ml/min (HCC) 03/21/2021   Overweight with body mass index (BMI) of 27 to 27.9 in adult 03/21/2021   Migraine 02/23/2018   Asymptomatic HIV infection, with no history of HIV-related illness (HCC)    Hyperlipidemia associated with type 2 diabetes mellitus (  HCC) 08/02/2014   DM (diabetes mellitus), type 2 (HCC) 07/31/2014   Ulcerative colitis (HCC) 06/26/2008   Depression, recurrent (HCC) 04/04/2008    PCP: Nicki Reaper NP   REFERRING PROVIDER: Nicki Reaper NP   REFERRING DIAG: [phone] (ICD-10-CM) - Acute bilateral low back pain with bilateral sciatica  Rationale for Evaluation and Treatment: Rehabilitation  THERAPY DIAG:  Acute bilateral low back pain with bilateral sciatica  ONSET DATE: 05/15/2023   SUBJECTIVE:                                                                                                                                                                                           SUBJECTIVE  STATEMENT: Pt continues to feel low back pain relief with slight increase in left sided calf pain.   PERTINENT HISTORY:  Pt reports that her sciatica started acting up about two weeks ago. It become hard to stand, to sit, and to lay down. She recently finished her prednisone medication which helped some and she has received two steroid injections in the past month, which also helped. When she does experience increased low back pain, pt experiences sciatica mostly on her left side but it does not radiate down to her feet.   PAIN:  Are you having pain? No  PRECAUTIONS: None  RED FLAGS: None   WEIGHT BEARING RESTRICTIONS: No  FALLS:  Has patient fallen in last 6 months? No  LIVING ENVIRONMENT: Lives with: lives with their family Lives in: House/apartment Stairs: Yes: Internal: 12 steps; right side railings  Has following equipment at home: None  OCCUPATION: Programmer, applications Job   PLOF: Independent  PATIENT GOALS: Patient wants to feel less pain with her physical activity so she can walk longer distances  NEXT MD VISIT: Nothing scheduled   OBJECTIVE:  Note: Objective measures were completed at Evaluation unless otherwise noted.  VITALS: BP 142/ 72 HR 91 Sp02 99   DIAGNOSTIC FINDINGS:  Not listed   PATIENT SURVEYS:  FOTO 67/100 with target of 70    SCREENING FOR RED FLAGS: Bowel or bladder incontinence: No Spinal tumors: No Cauda equina syndrome: No Compression fracture: No Abdominal aneurysm: No  COGNITION: Overall cognitive status: Within functional limits for tasks assessed     SENSATION: WFL  MUSCLE LENGTH: Hamstrings: Right 70 deg; Left 70 deg   POSTURE: No Significant postural limitations  PALPATION: No areas TTP   LUMBAR ROM:   AROM eval  Flexion 100%  Extension 100%*  Right lateral flexion 100%  Left lateral flexion 100%  Right rotation 100%  Left rotation 100%   (Blank rows = not tested)  LOWER  EXTREMITY ROM:     Active   Right eval Left eval  Hip flexion    Hip extension    Hip abduction    Hip adduction    Hip internal rotation    Hip external rotation    Knee flexion    Knee extension    Ankle dorsiflexion    Ankle plantarflexion    Ankle inversion    Ankle eversion     (Blank rows = not tested)  LOWER EXTREMITY MMT:    MMT Right eval Left eval  Hip flexion 4 4-  Hip extension 4- 3+  Hip abduction 4- 4-  Hip adduction    Hip internal rotation    Hip external rotation    Knee flexion 4 4-  Knee extension 4 4-  Ankle dorsiflexion 4 4  Ankle plantarflexion    Ankle inversion    Ankle eversion     (Blank rows = not tested)  LUMBAR SPECIAL TESTS:  Straight leg raise test: Negative and FABER test: Negative FADIR Negative   FUNCTIONAL TESTS:  None performed   GAIT: Distance walked: 50 ft  Assistive device utilized: None Level of assistance: Complete Independence Comments: No gait deficits noted   TODAY'S TREATMENT:                                                                                                                              DATE:   06/08/23:  VITALS 136/78 HR 93  : 1,200 ft  -Decreased arm swing and hip rotation and left sided [place] during test.  Post-text vitals:  BP 126/99, 5 min 131/75  RPE: 5/10  Borg Dyspnea: 0/10  Standing Hip Abduction 2 x 10 -Pt shows increased compensation with forward hip.  Side Lying Hip Abduction on RLE  1 x 10  Side Lying Hip Abduction on RLE 2 x 10  -mod VC for external cueing "Bring heel to wall"   Supine Bridges 1 x 10  -Pt reports increased pain in left calf   Standing Mini-Squats 2 x 10  -mod VC to stopping sitting down completely  Seated Sciatic Nerve glides 1 x 10  -Pt reports having a right sided spasm in abdominals.   06/04/23:  Seated HS Stretch 2 x 30 sec  Supine Bridges 1 x 10   Prone quad stretch 2 x 30 sec    PATIENT EDUCATION:  Education details: Form and technique for correct performance of  exercise.  Person educated: Patient Education method: Explanation, Demonstration, Verbal cues, and Handouts Education comprehension: verbalized understanding, returned demonstration, and verbal cues required  HOME EXERCISE PROGRAM: Access Code: [email or web address] URL: [email or web address] Date: 06/08/2023 Prepared by: Ellin Goodie  Exercises - Seated Sciatic Tensioner  - 1 x daily - 10 reps - Mini Squat  - 3-4 x weekly - 3 sets - 10 reps - Seated Hamstring Stretch  - 3-4 x weekly - 3 reps - 60 sec hold - Prone Quadriceps Stretch  with Strap  - 3-4 x weekly - 3 reps - 60 sec  hold - Sidelying Hip Abduction  - 3-4 x weekly - 3 sets - 10 reps  ASSESSMENT:  CLINICAL IMPRESSION: Pt presents for initial treatment after eval for left sided sciatica. Pt demonstrates ongoing neurological deficits with left leg from past stroke that include synergist movement with difficulty disassociating hip and knee movement with hip abduction. She was eventually able to complete hip abduction with mod VC and TC and external cueing. Despite subjective report of left sided sciatic pain limiting her ability to walk longer distances, pain did not limit pt's ability to complete and she was able to ambulate community level distances. She will continue to benefit from skilled PT to decreased left sided sciatic pain in order to continue to sit and stand and walk for prolonged periods of time to fulfill daily living and job related duties.    OBJECTIVE IMPAIRMENTS: decreased ROM, decreased strength, impaired flexibility, impaired sensation, and pain.   ACTIVITY LIMITATIONS: carrying, lifting, bending, standing, squatting, sleeping, stairs, bathing, toileting, and locomotion level  PARTICIPATION LIMITATIONS: cleaning, shopping, community activity, and yard work  PERSONAL FACTORS: Age and 3+ comorbidities: HTN, h/o left sided stroke, T2DM  are also affecting patient's functional outcome.   REHAB POTENTIAL:  Good  CLINICAL DECISION MAKING: Stable/uncomplicated  EVALUATION COMPLEXITY: Low   GOALS: Goals reviewed with patient? No  SHORT TERM GOALS: Target date: 06/18/2023  PT reviewed the following HEP with patient with patient able to demonstrate a set of the following with min cuing for correction needed. PT educated patient on parameters of therex (how/when to inc/decrease intensity, frequency, rep/set range, stretch hold time, and purpose of therex) with verbalized understanding.  Baseline: NT   Goal status: ONGOING   LONG TERM GOALS: Target date: 08/13/2023  Patient will have improved function and activity level as evidenced by an increase in FOTO score by 10 points or more.  Baseline: 67 with target of 71  Goal status: ONGOING   2.  Patient will improve hip strength by 1/3 MMT (4- to 4) for improved lumbar stability to prevent further aggravation of lumbar radicular symptoms for overall improvement in lumbar function.  Baseline: MMT Right eval Left eval  Hip flexion 4 4-  Hip extension 4- 3+  Hip abduction 4- 4-  Hip adduction    Hip internal rotation    Hip external rotation    Knee flexion 4 4-  Knee extension 4 4-  Ankle dorsiflexion 4 4  Ankle plantarflexion    Ankle inversion    Ankle eversion     (Blank rows = not tested) Goal status: ONGOING   3.  Pt will demonstrate a significant improvement in her distance of 191 feet or 58 meters for improved aerobic endurance and mobility.  Baseline: 1,200 feet  Goal status: ONGOING    PLAN:  PT FREQUENCY: 1-2x/week  PT DURATION: 10 weeks  PLANNED INTERVENTIONS: 97164- PT Re-evaluation, 97110-Therapeutic exercises, 97530- Therapeutic activity, [id number]- Neuromuscular re-education, 97140- Manual therapy, [id number]- Gait training, 01027- Aquatic Therapy, 97014- Electrical stimulation (unattended), [phone]- Electrical stimulation (manual), Patient/Family education, Balance training, Stair training, Dry Needling, Joint  mobilization, Joint manipulation, Spinal manipulation, Spinal mobilization, Cryotherapy, and Moist heat.  PLAN FOR NEXT SESSION: Hip IR and ER MMT. Progress flexion based hip strengthening exercises with NMR of left hip movements: Monster walks and step ups.   Ellin Goodie PT, DPT  [facility] [address]  [address] [place], [place], [address] Phone: [phone]   Fax:  [phone]

## 2023-06-10 ENCOUNTER — Other Ambulatory Visit (HOSPITAL_COMMUNITY): Payer: Self-pay

## 2023-06-16 ENCOUNTER — Telehealth: Payer: Self-pay | Admitting: Physical Therapy

## 2023-06-16 ENCOUNTER — Ambulatory Visit: Payer: BC Managed Care – PPO | Attending: Internal Medicine | Admitting: Physical Therapy

## 2023-06-16 ENCOUNTER — Other Ambulatory Visit: Payer: Self-pay

## 2023-06-16 DIAGNOSIS — M5442 Lumbago with sciatica, left side: Secondary | ICD-10-CM | POA: Insufficient documentation

## 2023-06-16 DIAGNOSIS — M5441 Lumbago with sciatica, right side: Secondary | ICD-10-CM | POA: Insufficient documentation

## 2023-06-16 NOTE — Telephone Encounter (Signed)
Called pt to inquire about absence from PT. Pt reports only able to make 6:15 time slot because of work. PT will reschedule remaining appointments.

## 2023-06-18 DIAGNOSIS — J301 Allergic rhinitis due to pollen: Secondary | ICD-10-CM | POA: Diagnosis not present

## 2023-06-18 DIAGNOSIS — J3081 Allergic rhinitis due to animal (cat) (dog) hair and dander: Secondary | ICD-10-CM | POA: Diagnosis not present

## 2023-06-18 DIAGNOSIS — J3089 Other allergic rhinitis: Secondary | ICD-10-CM | POA: Diagnosis not present

## 2023-06-19 ENCOUNTER — Other Ambulatory Visit: Payer: Self-pay

## 2023-06-22 ENCOUNTER — Other Ambulatory Visit (HOSPITAL_COMMUNITY): Payer: Self-pay

## 2023-06-22 ENCOUNTER — Other Ambulatory Visit: Payer: Self-pay | Admitting: Internal Medicine

## 2023-06-22 DIAGNOSIS — J3081 Allergic rhinitis due to animal (cat) (dog) hair and dander: Secondary | ICD-10-CM | POA: Diagnosis not present

## 2023-06-22 DIAGNOSIS — J3089 Other allergic rhinitis: Secondary | ICD-10-CM | POA: Diagnosis not present

## 2023-06-23 ENCOUNTER — Other Ambulatory Visit: Payer: Self-pay

## 2023-06-23 ENCOUNTER — Encounter: Payer: Self-pay | Admitting: Infectious Diseases

## 2023-06-23 ENCOUNTER — Ambulatory Visit: Payer: BC Managed Care – PPO | Admitting: Infectious Diseases

## 2023-06-23 VITALS — BP 126/86 | HR 90 | Temp 97.8°F | Resp 16 | Wt 159.6 lb

## 2023-06-23 DIAGNOSIS — Z21 Asymptomatic human immunodeficiency virus [HIV] infection status: Secondary | ICD-10-CM | POA: Diagnosis not present

## 2023-06-23 DIAGNOSIS — G8929 Other chronic pain: Secondary | ICD-10-CM

## 2023-06-23 DIAGNOSIS — Z23 Encounter for immunization: Secondary | ICD-10-CM | POA: Diagnosis not present

## 2023-06-23 DIAGNOSIS — B20 Human immunodeficiency virus [HIV] disease: Secondary | ICD-10-CM | POA: Diagnosis not present

## 2023-06-23 DIAGNOSIS — Z113 Encounter for screening for infections with a predominantly sexual mode of transmission: Secondary | ICD-10-CM | POA: Diagnosis not present

## 2023-06-23 NOTE — Progress Notes (Signed)
Patient: [name]  DOB: 04/22/1956 MRN: [id number] PCP: Lorre Munroe, NP    Subjective:  Brief ID:  Leslye Camilleri is a 67 y.o. female with HIV infection diagnosed August 2019 on routine screening after having had a stroke. VL 118,000 copies, CD4 500.  HIV Risk: heterosexual.  OI Hx: none  Previous Regimens:  Biktarvy 02-2018: suppressed  Dovato 05/2022 (CKD switch)   Resistance Testing:  02-2018 wildtype virus    Chief Complaint  Patient presents with   Follow-up    B20      Discussed the use of AI scribe software for clinical note transcription with the patient, who gave verbal consent to proceed.  History of Present Illness   Owen is here for routine HIV care and follow up. Diagnosed with diffuse degenerative disc disease, presents with persistent back pain that has been unresponsive to medication. The pain is severe enough to affect mobility, particularly when standing. The patient has recently started physical therapy, which is also addressing residual weakness from a previous stroke. The patient reports that the left side of her body is weaker due to the stroke, and the therapist has noted that this could be contributing to the back pain.  In addition to the back pain and post-stroke weakness, the patient has been dealing with allergies to dogs, cats, molds, and dust mites. The patient is currently receiving weekly allergy shots, which have been effective in managing the symptoms. The patient reports that the symptoms return when it's time for the next shot, indicating a consistent response to the treatment.  The patient also reports knee pain, particularly when climbing stairs. The pain is located behind the kneecap, and it is severe enough to require the use of a handrail for support when ascending stairs and sometimes having to pause while walking up. The patient has previously received steroid injections for this issue which did not help symptoms.    The patient's recent medical history also includes a minor stroke experienced by her mother, who has been diagnosed with dementia. The patient's mother lives with the patient's sister, and she has recently arranged for in-home care to assist with her needs. The patient is actively involved in her mother's care and is affected by her health issues.  In terms of lifestyle, the patient recently hosted a large family gathering for Thanksgiving, which was both enjoyable and tiring. The patient is also involved in the care of several pets, despite the allergies. The patient's social activities and responsibilities appear to be a significant part of her life, despite the ongoing health issues.   She continues to take her dovato without trouble. No side effects or concern with access to her medicine.       Review of Systems  Constitutional:  Negative for chills and fever.  HENT:  Negative for sore throat.   Respiratory:  Negative for cough and shortness of breath.   Cardiovascular: Negative.   Gastrointestinal:  Negative for abdominal pain, diarrhea and vomiting.  Musculoskeletal:  Negative for myalgias and neck pain.  Skin:  Negative for rash.  Neurological:  Negative for headaches.  Psychiatric/Behavioral:  The patient is not nervous/anxious.      Past Medical History:  Diagnosis Date   Arthritis    fingers   Asthma    no trouble lately   Diabetes mellitus without complication (HCC) 11/29/2012   hx. NIDDM-dx. 3 weeks ago   Headache    migraines - none since stroke   HIV (  human immunodeficiency virus infection) (HCC)    Hyperlipidemia    hx   Hypertension    Kidney disease    staged three   Seasonal allergies    has cough   Stroke (HCC) 02/18/2018   Residual Left side weakness and speech issues   Ulcerative colitis (HCC)    Vertigo    benign   Wears contact lenses    Wears partial dentures    upper/ has lower but doesn't wear    Outpatient Medications Prior to Visit   Medication Sig Dispense Refill   albuterol (VENTOLIN HFA) 108 (90 Base) MCG/ACT inhaler Inhale 2 puffs into the lungs every 4 (four) hours as needed for wheezing or shortness of breath. 8 g 2   aspirin EC 81 MG EC tablet Take 1 tablet (81 mg total) by mouth daily.     azelastine (ASTELIN) 0.1 % nasal spray Place 2 sprays into both nostrils 2 (two) times daily. Use in each nostril as directed (Patient taking differently: Place 2 sprays into both nostrils as needed. Use in each nostril as directed) 30 mL 12   B-D UF III MINI PEN NEEDLES 31G X 5 MM MISC USE TO INJECT INSULIN SIX TIMES DAILY 90 each 23   cyclobenzaprine (FLEXERIL) 10 MG tablet Take 1 tablet (10 mg total) by mouth 3 (three) times daily as needed for muscle spasms. 30 tablet 0   dolutegravir-lamiVUDine (DOVATO) 50-300 MG tablet Take 1 tablet by mouth daily. 30 tablet 11   ezetimibe (ZETIA) 10 MG tablet TAKE 1 TABLET DAILY 90 tablet 2   glipiZIDE (GLUCOTROL) 10 MG tablet Take 1 tablet (10 mg total) by mouth 2 (two) times daily before a meal. 180 tablet 1   glucose 4 GM chewable tablet Chew 1 tablet (4 g total) by mouth as needed for low blood sugar. 30 tablet 3   glucose blood (FREESTYLE LITE) test strip Use to test blood sugar twice a day DX: E11.9 100 strip 5   meloxicam (MOBIC) 15 MG tablet Take 1 tablet (15 mg total) by mouth daily as needed for pain. For up to 2 weeks then as needed 30 tablet 0   montelukast (SINGULAIR) 10 MG tablet Take 1 tablet (10 mg total) by mouth at bedtime. 90 tablet 3   potassium chloride SA (KLOR-CON M20) 20 MEQ tablet TAKE 1 TABLET DAILY 90 tablet 1   rosuvastatin (CRESTOR) 10 MG tablet TAKE 1 TABLET DAILY 90 tablet 3   sitaGLIPtin (JANUVIA) 100 MG tablet Take 1 tablet (100 mg total) by mouth daily. 90 tablet 1   topiramate (TOPAMAX) 50 MG tablet Take 1 tablet (50 mg total) by mouth 2 (two) times daily. 90 tablet 1   TRESIBA FLEXTOUCH 100 UNIT/ML FlexTouch Pen INJECT 12 UNITS INTO THE SKIN DAILY 15 mL 2    triamcinolone cream (KENALOG) 0.1 % Apply 1 Application topically 2 (two) times daily. 30 g 0   mirtazapine (REMERON SOL-TAB) 30 MG disintegrating tablet Take 1 tablet (30 mg total) by mouth at bedtime. (Patient not taking: Reported on 06/04/2023) 90 tablet 1   predniSONE (DELTASONE) 20 MG tablet Take daily with food. Start with 60mg  (3 pills) x 2 days, then reduce to 40mg  (2 pills) x 2 days, then 20mg  (1 pill) x 3 days (Patient not taking: Reported on 06/04/2023) 13 tablet 0   No facility-administered medications prior to visit.     Allergies  Allergen Reactions   Azithromycin Itching    Muscle aches   Erythromycin  Nausea And Vomiting   Penicillins Nausea And Vomiting    Did it involve swelling of the face/tongue/throat, SOB, or low BP? No Did it involve sudden or severe rash/hives, skin peeling, or any reaction on the inside of your mouth or nose? No Did you need to seek medical attention at a hospital or doctor's office? No When did it last happen?      childhood allergy If all above answers are "NO", may proceed with cephalosporin use.    Tetracycline Nausea And Vomiting    Social History   Tobacco Use   Smoking status: Never   Smokeless tobacco: Never  Vaping Use   Vaping status: Never Used  Substance Use Topics   Alcohol use: Not Currently    Alcohol/week: 0.0 standard drinks of alcohol   Drug use: No    Objective:   Vitals:   06/23/23 0947  BP: 126/86  Pulse: 90  Resp: 16  Temp: 97.8 F (36.6 C)  TempSrc: Oral  SpO2: 98%  Weight: 159 lb 9.6 oz (72.4 kg)      Body mass index is 29.19 kg/m.  Physical Exam Constitutional:      Appearance: Normal appearance. She is not ill-appearing.  HENT:     Mouth/Throat:     Mouth: Mucous membranes are moist.     Pharynx: Oropharynx is clear.  Eyes:     General: No scleral icterus. Cardiovascular:     Rate and Rhythm: Normal rate and regular rhythm.  Pulmonary:     Effort: Pulmonary effort is normal.   Neurological:     Mental Status: She is oriented to person, place, and time.  Psychiatric:        Mood and Affect: Mood normal.        Thought Content: Thought content normal.     Lab Results: HIV 1 RNA Quant (Copies/mL)  Date Value  11/21/2022 Not Detected  07/11/2022 Not Detected  05/30/2022 Not Detected   CD4 T Cell Abs (/uL)  Date Value  07/24/2021 885  11/26/2020 952  05/28/2020 876    Lab Results  Component Value Date   WBC 5.3 01/26/2023   HGB 12.0 01/26/2023   HCT 37.0 01/26/2023   MCV 96.1 01/26/2023   PLT 211 01/26/2023    Lab Results  Component Value Date   CREATININE 1.16 (H) 01/26/2023   BUN 15 01/26/2023   NA 142 01/26/2023   K 3.9 01/26/2023   CL 110 01/26/2023   CO2 25 01/26/2023    Lab Results  Component Value Date   ALT 34 (H) 01/26/2023   AST 27 01/26/2023   ALKPHOS 104 09/25/2020   BILITOT 0.3 01/26/2023     Assessment & Plan:     HIV -  Very well controlled on once daily Dovato . Switched to help with long term kidney/bone health. She is tolerating well and virologically well controlled. Will repeat her VL and CD4 today. Had recent CMP/CBC and lipid with other care team. Routine RPR screen today, unable to produce urine for GC/CT screen.  -On statin for secondary prevention with stroke  -Pap smear up to date.  -Labs today  -Continue Dovato  -FU in 6 months    Degenerative Disc Disease Recent lumbar x-ray showed diffuse degenerative changes. Currently in physical therapy which is also addressing weakness from prior stroke. -Continue physical therapy as recommended by her PCP - appreciate management  -Can consider Meloxicam now that Jeriesha is on Dovato - no concern for renal impact.  Knee Pain Reports difficulty climbing stairs due to knee pain, likely due to arthritis. No improvement from steroid injection. May benefit from PRP vs Hyaluronic Acid shots.  -Referral to Dr. Terrilee Files with sports medicine for consideration of  above.   General Health Maintenance -Administer second dose of Shingrix vaccine and pneumonia vaccine today. -Plan to check blood work today. -Follow-up in six months.      No orders of the defined types were placed in this encounter.  Orders Placed This Encounter  Procedures   Varicella-zoster vaccine IM   Pneumococcal conjugate vaccine 20-valent   HIV 1 RNA quant-no reflex-bld   T-helper cells (CD4) count   RPR   AMB referral to sports medicine    Referral Priority:   Routine    Referral Type:   Consultation    Referred to Provider:   Judi Saa, DO    Number of Visits Requested:   1   Return in about 6 months (around 12/22/2023).   Rexene Alberts, MSN, NP-C [facility] for Infectious Disease [facility]  [email or web address] Pager: [phone] Office: [phone] [facility] Main Line: [phone]    06/23/23  12:18 PM

## 2023-06-23 NOTE — Telephone Encounter (Signed)
Requested by interface surescripts. Future visit in 1 month. Requested Prescriptions  Pending Prescriptions Disp Refills   glipiZIDE (GLUCOTROL) 10 MG tablet [Pharmacy Med Name: GLIPIZIDE TABS 10MG ] 180 tablet 2    Sig: TAKE 1 TABLET TWICE A DAY BEFORE MEALS     Endocrinology:  Diabetes - Sulfonylureas Failed - 06/22/2023 12:08 AM      Failed - Cr in normal range and within 360 days    Creat  Date Value Ref Range Status  01/26/2023 1.16 (H) 0.50 - 1.05 mg/dL Final   Creatinine,U  Date Value Ref Range Status  09/25/2020 145.4 mg/dL Final   Creatinine, Urine  Date Value Ref Range Status  10/30/2022 215 20 - 275 mg/dL Final         Passed - HBA1C is between 0 and 7.9 and within 180 days    Hemoglobin A1C  Date Value Ref Range Status  06/22/2014 7.5 (H) 4.2 - 6.3 % Final    Comment:    The American Diabetes Association recommends that a primary goal of therapy should be <7% and that physicians should reevaluate the treatment regimen in patients with HbA1c values consistently >8%.    HbA1c, POC (controlled diabetic range)  Date Value Ref Range Status  01/26/2023 7.0 0.0 - 7.0 % Final         Passed - Valid encounter within last 6 months    Recent Outpatient Visits           3 weeks ago Acute bilateral low back pain with bilateral sciatica   [facility] [place], [place] W, NP   1 month ago Acute bilateral low back pain with bilateral sciatica   [place] [place], Netta Neat, DO   4 months ago Overweight with body mass index (BMI) of 27 to 27.9 in adult   [facility] [place], [place] W, NP   4 months ago Type 2 diabetes mellitus with hyperglycemia, without long-term current use of insulin [facility])   [place] [place], Salvadore Oxford, NP   7 months ago Acute cough   [place] [place], Salvadore Oxford, NP       Future Appointments             In 1  month [place], Salvadore Oxford, NP [place] [facility], [facility]

## 2023-06-23 NOTE — Patient Instructions (Addendum)
Always a pleasure to see you!   Continue your Dovato once a day as you have been. You have plenty of refills.   Stop by the lab on your way out.    We gave you your last shingles shot today and your last pneumonia vaccine - yay!   Will put in a referral for you to see Dr. Terrilee Files at [facility] to see if you may be a candidate for the hylauronic acid or PRP injections for your knee pain.  [phone]

## 2023-06-24 ENCOUNTER — Encounter: Payer: Self-pay | Admitting: Physical Therapy

## 2023-06-24 ENCOUNTER — Ambulatory Visit: Payer: BC Managed Care – PPO | Admitting: Physical Therapy

## 2023-06-24 DIAGNOSIS — M5442 Lumbago with sciatica, left side: Secondary | ICD-10-CM

## 2023-06-24 DIAGNOSIS — M5441 Lumbago with sciatica, right side: Secondary | ICD-10-CM | POA: Diagnosis not present

## 2023-06-24 LAB — T-HELPER CELLS (CD4) COUNT (NOT AT ARMC)
CD4 % Helper T Cell: 43 % (ref 33–65)
CD4 T Cell Abs: 851 /uL (ref 400–1790)

## 2023-06-24 NOTE — Therapy (Unsigned)
OUTPATIENT PHYSICAL THERAPY THORACOLUMBAR TREATMENT   Patient Name: [name] MRN: [id number] DOB:10-02-1955, 67 y.o., female Today's Date: 06/24/2023  END OF SESSION:  PT End of Session - 06/24/23 1813     Visit Number 3    Number of Visits 20    Date for PT Re-Evaluation 08/13/23    Authorization Type [employer]    Authorization - Visit Number 3    Authorization - Number of Visits 20    Progress Note Due on Visit 10    PT Start Time 1810    PT Stop Time 1850    PT Time Calculation (min) 40 min    Activity Tolerance Patient tolerated treatment well    Behavior During Therapy WFL for tasks assessed/performed              Past Medical History:  Diagnosis Date   Arthritis    fingers   Asthma    no trouble lately   Diabetes mellitus without complication (HCC) 11/29/2012   hx. NIDDM-dx. 3 weeks ago   Headache    migraines - none since stroke   HIV (human immunodeficiency virus infection) (HCC)    Hyperlipidemia    hx   Hypertension    Kidney disease    staged three   Seasonal allergies    has cough   Stroke (HCC) 02/18/2018   Residual Left side weakness and speech issues   Ulcerative colitis (HCC)    Vertigo    benign   Wears contact lenses    Wears partial dentures    upper/ has lower but doesn't wear   Past Surgical History:  Procedure Laterality Date   BUNIONECTOMY     bilateral and toe nails of big toes removed   CESAREAN SECTION  1997/2006   CHOLECYSTECTOMY N/A 12/01/2012   Procedure: LAPAROSCOPIC CHOLECYSTECTOMY;  Surgeon: Almond Lint, MD;  Location: [facility];  Service: General;  Laterality: N/A;   COLONOSCOPY W/ POLYPECTOMY     COLONOSCOPY WITH PROPOFOL N/A 11/17/2022   Procedure: COLONOSCOPY WITH BIOPSY;  Surgeon: Midge Minium, MD;  Location: [facility];  Service: Endoscopy;  Laterality: N/A;   EYE SURGERY Bilateral 2013   MULTIPLE TOOTH EXTRACTIONS     POLYPECTOMY N/A 11/17/2022   Procedure: POLYPECTOMY;  Surgeon: Midge Minium,  MD;  Location: [facility];  Service: Endoscopy;  Laterality: N/A;  CLIP X 1 PLACED AT CECAL POLYP REMOVAL SITE   RADIOLOGY WITH ANESTHESIA Left 09/09/2018   Procedure: MRI WITH ANESTHESIA LEFT SHOULDER WITH CONTRAST;  Surgeon: Radiologist, Medication, MD;  Location: [facility];  Service: Radiology;  Laterality: Left;   SHOULDER ARTHROSCOPY WITH ROTATOR CUFF REPAIR AND SUBACROMIAL DECOMPRESSION Left 11/30/2018   Procedure: SHOULDER ARTHROSCOPY WITH SUBACROMIAL DECOMPRESSION AND DISTAL CLAVICAL EXCISION;  Surgeon: Juanell Fairly, MD;  Location: [facility];  Service: Orthopedics;  Laterality: Left;  Diabetic - oral meds   TONSILLECTOMY     TUBAL LIGATION     Patient Active Problem List   Diagnosis Date Noted   History of CVA (cerebrovascular accident) 01/26/2023   Hemiparesis affecting left side as late effect of cerebrovascular accident (HCC) 07/10/2021   HTN (hypertension) 03/21/2021   Eczema 03/21/2021   CKD (chronic kidney disease) stage 3, GFR 30-59 ml/min (HCC) 03/21/2021   Overweight with body mass index (BMI) of 27 to 27.9 in adult 03/21/2021   Migraine 02/23/2018   Asymptomatic HIV infection, with no history of HIV-related illness (HCC)    Hyperlipidemia associated with type 2 diabetes mellitus (  HCC) 08/02/2014   DM (diabetes mellitus), type 2 (HCC) 07/31/2014   Ulcerative colitis (HCC) 06/26/2008   Depression, recurrent (HCC) 04/04/2008    PCP: Nicki Reaper NP   REFERRING PROVIDER: Nicki Reaper NP   REFERRING DIAG: [phone] (ICD-10-CM) - Acute bilateral low back pain with bilateral sciatica  Rationale for Evaluation and Treatment: Rehabilitation  THERAPY DIAG:  Acute bilateral low back pain with bilateral sciatica  ONSET DATE: 05/15/2023   SUBJECTIVE:                                                                                                                                                                                           SUBJECTIVE  STATEMENT: Pt reports increased low back pain since last session and she had a muscle spasm that went down her right quad. She continues to experience increased pain in her knees especially when going up and down stairs.   PERTINENT HISTORY:  Pt reports that her sciatica started acting up about two weeks ago. It become hard to stand, to sit, and to lay down. She recently finished her prednisone medication which helped some and she has received two steroid injections in the past month, which also helped. When she does experience increased low back pain, pt experiences sciatica mostly on her left side but it does not radiate down to her feet.   PAIN:  Are you having pain? No  PRECAUTIONS: None  RED FLAGS: None   WEIGHT BEARING RESTRICTIONS: No  FALLS:  Has patient fallen in last 6 months? No  LIVING ENVIRONMENT: Lives with: lives with their family Lives in: House/apartment Stairs: Yes: Internal: 12 steps; right side railings  Has following equipment at home: None  OCCUPATION: Programmer, applications Job   PLOF: Independent  PATIENT GOALS: Patient wants to feel less pain with her physical activity so she can walk longer distances  NEXT MD VISIT: Nothing scheduled   OBJECTIVE:  Note: Objective measures were completed at Evaluation unless otherwise noted.  VITALS: BP 142/ 72 HR 91 Sp02 99   DIAGNOSTIC FINDINGS:  Not listed   PATIENT SURVEYS:  FOTO 67/100 with target of 63    SCREENING FOR RED FLAGS: Bowel or bladder incontinence: No Spinal tumors: No Cauda equina syndrome: No Compression fracture: No Abdominal aneurysm: No  COGNITION: Overall cognitive status: Within functional limits for tasks assessed     SENSATION: WFL  MUSCLE LENGTH: Hamstrings: Right 70 deg; Left 70 deg   POSTURE: No Significant postural limitations  PALPATION: No areas TTP   LUMBAR ROM:   AROM eval  Flexion 100%  Extension 100%*  Right lateral flexion 100%  Left lateral flexion  100%  Right rotation 100%  Left rotation 100%   (Blank rows = not tested)  LOWER EXTREMITY ROM:     Active  Right eval Left eval  Hip flexion    Hip extension    Hip abduction    Hip adduction    Hip internal rotation    Hip external rotation    Knee flexion    Knee extension    Ankle dorsiflexion    Ankle plantarflexion    Ankle inversion    Ankle eversion     (Blank rows = not tested)  LOWER EXTREMITY MMT:    MMT Right eval Left eval  Hip flexion 4 4-  Hip extension 4- 3+  Hip abduction 4- 4-  Hip adduction    Hip internal rotation    Hip external rotation    Knee flexion 4 4-  Knee extension 4 4-  Ankle dorsiflexion 4 4  Ankle plantarflexion    Ankle inversion    Ankle eversion     (Blank rows = not tested)  LUMBAR SPECIAL TESTS:  Straight leg raise test: Negative and FABER test: Negative FADIR Negative   FUNCTIONAL TESTS:  None performed   GAIT: Distance walked: 50 ft  Assistive device utilized: None Level of assistance: Complete Independence Comments: No gait deficits noted   TODAY'S TREATMENT:                                                                                                                              DATE:   06/24/23:  Nu-Step with seat and arms at 6 for 5 min Standing Hip Ext 2 x 10  Quadruped Hip Extension on wrists 2 x 10 -Pt reports increased wrist pain  Quadruped Hip Extension on forearms 2 x10 Side Lying Hip Abduction 2 x 10  Side Lying Hip Abduction with yellow band 2 x 10  Pallof Press #5 2 x 10  Pallof Press #10 2 x 10   06/08/23:  VITALS 136/78 HR 93  : 1,200 ft  -Decreased arm swing and hip rotation and left sided [place] during test.  Post-text vitals:  BP 126/99, 5 min 131/75  RPE: 5/10  Borg Dyspnea: 0/10  Standing Hip Abduction 2 x 10 -Pt shows increased compensation with forward hip.  Side Lying Hip Abduction on RLE  1 x 10  Side Lying Hip Abduction on RLE 2 x 10  -mod VC for external cueing  "Bring heel to wall"   Supine Bridges 1 x 10  -Pt reports increased pain in left calf   Standing Mini-Squats 2 x 10  -mod VC to stopping sitting down completely  Seated Sciatic Nerve glides 1 x 10  -Pt reports having a right sided spasm in abdominals.   06/04/23:  Seated HS Stretch 2 x 30 sec  Supine Bridges 1 x 10   Prone quad stretch 2 x 30 sec    PATIENT EDUCATION:  Education details: Form and technique for correct performance of exercise.  Person educated: Patient Education method: Explanation, Demonstration, Verbal cues, and Handouts Education comprehension: verbalized understanding, returned demonstration, and verbal cues required  HOME EXERCISE PROGRAM: Access Code: [email or web address] URL: [email or web address] Date: 06/24/2023 Prepared by: Ellin Goodie  Exercises - Seated Sciatic Tensioner  - 1 x daily - 10 reps - Mini Squat  - 3-4 x weekly - 3 sets - 10 reps - Seated Hamstring Stretch  - 3-4 x weekly - 3 reps - 60 sec hold - Prone Quadriceps Stretch with Strap  - 3-4 x weekly - 3 reps - 60 sec  hold - Sidelying Hip Abduction  - 3-4 x weekly - 3 sets - 10 reps - Quadruped on Forearms Hip Extension  - 1 x daily - 3-4 x weekly - 3 sets - 10 reps - Standing Hip Extension with Unilateral Counter Support  - 3-4 x weekly - 3 sets - 10 reps - Standing March  - 3-4 x weekly - 3 sets - 10 reps  ASSESSMENT:  CLINICAL IMPRESSION: Pt presents for initial treatment after eval for left sided sciatica. Pt demonstrates ongoing neurological deficits with left leg from past stroke that include synergist movement with difficulty disassociating hip and knee movement with hip abduction. She was eventually able to complete hip abduction with mod VC and TC and external cueing. Despite subjective report of left sided sciatic pain limiting her ability to walk longer distances, pain did not limit pt's ability to complete and she was able to ambulate community level distances. She will  continue to benefit from skilled PT to decreased left sided sciatic pain in order to continue to sit and stand and walk for prolonged periods of time to fulfill daily living and job related duties.    OBJECTIVE IMPAIRMENTS: decreased ROM, decreased strength, impaired flexibility, impaired sensation, and pain.   ACTIVITY LIMITATIONS: carrying, lifting, bending, standing, squatting, sleeping, stairs, bathing, toileting, and locomotion level  PARTICIPATION LIMITATIONS: cleaning, shopping, community activity, and yard work  PERSONAL FACTORS: Age and 3+ comorbidities: HTN, h/o left sided stroke, T2DM  are also affecting patient's functional outcome.   REHAB POTENTIAL: Good  CLINICAL DECISION MAKING: Stable/uncomplicated  EVALUATION COMPLEXITY: Low   GOALS: Goals reviewed with patient? No  SHORT TERM GOALS: Target date: 06/18/2023  PT reviewed the following HEP with patient with patient able to demonstrate a set of the following with min cuing for correction needed. PT educated patient on parameters of therex (how/when to inc/decrease intensity, frequency, rep/set range, stretch hold time, and purpose of therex) with verbalized understanding.  Baseline: NT   Goal status: ONGOING   LONG TERM GOALS: Target date: 08/13/2023  Patient will have improved function and activity level as evidenced by an increase in FOTO score by 10 points or more.  Baseline: 67 with target of 71  Goal status: ONGOING   2.  Patient will improve hip strength by 1/3 MMT (4- to 4) for improved lumbar stability to prevent further aggravation of lumbar radicular symptoms for overall improvement in lumbar function.  Baseline: MMT Right eval Left eval  Hip flexion 4 4-  Hip extension 4- 3+  Hip abduction 4- 4-  Hip adduction    Hip internal rotation    Hip external rotation    Knee flexion 4 4-  Knee extension 4 4-  Ankle dorsiflexion 4 4  Ankle plantarflexion    Ankle inversion    Ankle eversion     (Blank  rows = not tested) Goal status: ONGOING   3.  Pt will demonstrate a significant improvement in her distance of 191 feet or 58 meters for improved aerobic endurance and mobility.  Baseline: 1,200 feet  Goal status: ONGOING    PLAN:  PT FREQUENCY: 1-2x/week  PT DURATION: 10 weeks  PLANNED INTERVENTIONS: 97164- PT Re-evaluation, 97110-Therapeutic exercises, 97530- Therapeutic activity, [id number]- Neuromuscular re-education, 97140- Manual therapy, [id number]- Gait training, 60737- Aquatic Therapy, 97014- Electrical stimulation (unattended), [id number]- Electrical stimulation (manual), Patient/Family education, Balance training, Stair training, Dry Needling, Joint mobilization, Joint manipulation, Spinal manipulation, Spinal mobilization, Cryotherapy, and Moist heat.  PLAN FOR NEXT SESSION: Hip IR and ER MMT. Progress flexion based hip strengthening exercises with NMR of left hip movements: Monster walks and step ups.   Ellin Goodie PT, DPT  [facility] [address] [place], [address] Phone: [phone]   Fax:  [phone]

## 2023-06-26 DIAGNOSIS — J3089 Other allergic rhinitis: Secondary | ICD-10-CM | POA: Diagnosis not present

## 2023-06-26 DIAGNOSIS — J301 Allergic rhinitis due to pollen: Secondary | ICD-10-CM | POA: Diagnosis not present

## 2023-06-26 DIAGNOSIS — J3081 Allergic rhinitis due to animal (cat) (dog) hair and dander: Secondary | ICD-10-CM | POA: Diagnosis not present

## 2023-06-26 LAB — RPR: RPR Ser Ql: NONREACTIVE

## 2023-06-26 LAB — HIV-1 RNA QUANT-NO REFLEX-BLD
HIV 1 RNA Quant: NOT DETECTED {copies}/mL
HIV-1 RNA Quant, Log: NOT DETECTED {Log}

## 2023-06-30 ENCOUNTER — Telehealth: Payer: Self-pay | Admitting: Physical Therapy

## 2023-06-30 ENCOUNTER — Ambulatory Visit: Payer: BC Managed Care – PPO | Admitting: Physical Therapy

## 2023-06-30 NOTE — Telephone Encounter (Signed)
Called pt to inquire about absence, but was unable to reach.

## 2023-07-02 ENCOUNTER — Ambulatory Visit: Payer: BC Managed Care – PPO | Admitting: Physical Therapy

## 2023-07-02 DIAGNOSIS — J3089 Other allergic rhinitis: Secondary | ICD-10-CM | POA: Diagnosis not present

## 2023-07-02 DIAGNOSIS — J3081 Allergic rhinitis due to animal (cat) (dog) hair and dander: Secondary | ICD-10-CM | POA: Diagnosis not present

## 2023-07-02 DIAGNOSIS — J301 Allergic rhinitis due to pollen: Secondary | ICD-10-CM | POA: Diagnosis not present

## 2023-07-09 ENCOUNTER — Other Ambulatory Visit: Payer: Self-pay

## 2023-07-09 ENCOUNTER — Encounter: Payer: Self-pay | Admitting: Internal Medicine

## 2023-07-09 ENCOUNTER — Ambulatory Visit (INDEPENDENT_AMBULATORY_CARE_PROVIDER_SITE_OTHER): Payer: BC Managed Care – PPO | Admitting: Internal Medicine

## 2023-07-09 VITALS — BP 122/82 | Ht 62.0 in | Wt 156.4 lb

## 2023-07-09 DIAGNOSIS — E1165 Type 2 diabetes mellitus with hyperglycemia: Secondary | ICD-10-CM

## 2023-07-09 DIAGNOSIS — G43019 Migraine without aura, intractable, without status migrainosus: Secondary | ICD-10-CM

## 2023-07-09 DIAGNOSIS — I10 Essential (primary) hypertension: Secondary | ICD-10-CM | POA: Diagnosis not present

## 2023-07-09 DIAGNOSIS — M150 Primary generalized (osteo)arthritis: Secondary | ICD-10-CM

## 2023-07-09 DIAGNOSIS — K5190 Ulcerative colitis, unspecified, without complications: Secondary | ICD-10-CM

## 2023-07-09 DIAGNOSIS — E785 Hyperlipidemia, unspecified: Secondary | ICD-10-CM

## 2023-07-09 DIAGNOSIS — J3089 Other allergic rhinitis: Secondary | ICD-10-CM | POA: Diagnosis not present

## 2023-07-09 DIAGNOSIS — E663 Overweight: Secondary | ICD-10-CM

## 2023-07-09 DIAGNOSIS — L308 Other specified dermatitis: Secondary | ICD-10-CM

## 2023-07-09 DIAGNOSIS — N1831 Chronic kidney disease, stage 3a: Secondary | ICD-10-CM

## 2023-07-09 DIAGNOSIS — F339 Major depressive disorder, recurrent, unspecified: Secondary | ICD-10-CM

## 2023-07-09 DIAGNOSIS — Z21 Asymptomatic human immunodeficiency virus [HIV] infection status: Secondary | ICD-10-CM

## 2023-07-09 DIAGNOSIS — J3081 Allergic rhinitis due to animal (cat) (dog) hair and dander: Secondary | ICD-10-CM | POA: Diagnosis not present

## 2023-07-09 DIAGNOSIS — I69354 Hemiplegia and hemiparesis following cerebral infarction affecting left non-dominant side: Secondary | ICD-10-CM

## 2023-07-09 DIAGNOSIS — J301 Allergic rhinitis due to pollen: Secondary | ICD-10-CM | POA: Diagnosis not present

## 2023-07-09 DIAGNOSIS — Z8673 Personal history of transient ischemic attack (TIA), and cerebral infarction without residual deficits: Secondary | ICD-10-CM

## 2023-07-09 DIAGNOSIS — M1990 Unspecified osteoarthritis, unspecified site: Secondary | ICD-10-CM | POA: Insufficient documentation

## 2023-07-09 DIAGNOSIS — E1169 Type 2 diabetes mellitus with other specified complication: Secondary | ICD-10-CM

## 2023-07-09 DIAGNOSIS — Z6828 Body mass index (BMI) 28.0-28.9, adult: Secondary | ICD-10-CM

## 2023-07-09 LAB — POCT GLYCOSYLATED HEMOGLOBIN (HGB A1C): Hemoglobin A1C: 9.3 % — AB (ref 4.0–5.6)
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
## 2023-07-09 NOTE — Patient Instructions (Signed)

## 2023-07-09 NOTE — Assessment & Plan Note (Signed)
She has not had any migraines off her Topamax, will discontinue at this time Monitor

## 2023-07-09 NOTE — Assessment & Plan Note (Signed)
Encourage diet and exercise for weight loss 

## 2023-07-09 NOTE — Assessment & Plan Note (Signed)
POCT A1c 9.3% Urine microalbumin has been checked within the last year Encouraged low-carb diet and exercise for weight loss Continue glipizide, Januvia and Tresiba Increase Tresiba by 1 unit every 3 days until fasting sugars are <120 Encourage routine eye exam Encouraged routine foot exam Immunizations UTD

## 2023-07-09 NOTE — Assessment & Plan Note (Signed)
C-Met today Not currently on ACEI/ARB

## 2023-07-09 NOTE — Assessment & Plan Note (Signed)
Discussed the importance of good blood pressure, cholesterol and diabetes control C-Met and lipid profile today Encouraged her consume a low-fat diet Continue rosuvastatin, ezetimibe and aspirin

## 2023-07-09 NOTE — Assessment & Plan Note (Signed)
Asymptomatic off meds We will monitor

## 2023-07-09 NOTE — Assessment & Plan Note (Signed)
Continue Dovato per ID.

## 2023-07-09 NOTE — Assessment & Plan Note (Signed)
Continue triamcinolone as needed.

## 2023-07-09 NOTE — Assessment & Plan Note (Signed)
Okay to take Tylenol arthritis OTC as needed

## 2023-07-09 NOTE — Assessment & Plan Note (Signed)
Encourage daily strengthening exercises

## 2023-07-09 NOTE — Progress Notes (Signed)
Specialty Pharmacy Refill Coordination Note  [name] is a 67 y.o. female contacted today regarding refills of specialty medication(s) Dolutegravir-lamiVUDine (Dovato)   Patient requested Delivery   Delivery date: 07/10/23   Verified address: [address]   [place] [address]   Medication will be filled on 07/09/23.

## 2023-07-09 NOTE — Assessment & Plan Note (Signed)
C-Met and lipid profile today Encouraged her to consume a low-fat diet Continue rosuvastatin and ezetimibe

## 2023-07-09 NOTE — Assessment & Plan Note (Signed)
Stable off meds at this time Support offered

## 2023-07-09 NOTE — Assessment & Plan Note (Signed)
Controlled off medications We will monitor

## 2023-07-10 ENCOUNTER — Encounter: Payer: Self-pay | Admitting: Internal Medicine

## 2023-07-10 LAB — COMPLETE METABOLIC PANEL WITH GFR
AG Ratio: 1.5 (calc) (ref 1.0–2.5)
ALT: 16 U/L (ref 6–29)
AST: 14 U/L (ref 10–35)
Albumin: 4.1 g/dL (ref 3.6–5.1)
Alkaline phosphatase (APISO): 112 U/L (ref 37–153)
BUN: 16 mg/dL (ref 7–25)
CO2: 29 mmol/L (ref 20–32)
Calcium: 9.2 mg/dL (ref 8.6–10.4)
Chloride: 105 mmol/L (ref 98–110)
Creat: 0.9 mg/dL (ref 0.50–1.05)
Globulin: 2.7 g/dL (ref 1.9–3.7)
Glucose, Bld: 168 mg/dL — ABNORMAL HIGH (ref 65–99)
Potassium: 4.5 mmol/L (ref 3.5–5.3)
Sodium: 141 mmol/L (ref 135–146)
Total Bilirubin: 0.4 mg/dL (ref 0.2–1.2)
Total Protein: 6.8 g/dL (ref 6.1–8.1)
eGFR: 71 mL/min/{1.73_m2} (ref >=60)

## 2023-07-10 LAB — CBC
HCT: 37.4 % (ref 35.0–45.0)
Hemoglobin: 11.8 g/dL (ref 11.7–15.5)
MCH: 29.6 pg (ref 27.0–33.0)
MCHC: 31.6 g/dL — ABNORMAL LOW (ref 32.0–36.0)
MCV: 94 fL (ref 80.0–100.0)
MPV: 9.7 fL (ref 7.5–12.5)
Platelets: 316 10*3/uL (ref 140–400)
RBC: 3.98 10*6/uL (ref 3.80–5.10)
RDW: 12.6 % (ref 11.0–15.0)
WBC: 6.2 10*3/uL (ref 3.8–10.8)

## 2023-07-10 LAB — LIPID PANEL
Cholesterol: 124 mg/dL (ref <200)
HDL: 48 mg/dL — ABNORMAL LOW (ref >=50)
LDL Cholesterol (Calc): 53 mg/dL
Non-HDL Cholesterol (Calc): 76 mg/dL (ref <130)
Total CHOL/HDL Ratio: 2.6 (calc) (ref <5.0)
Triglycerides: 151 mg/dL — ABNORMAL HIGH (ref <150)

## 2023-07-13 ENCOUNTER — Other Ambulatory Visit: Payer: Self-pay | Admitting: Internal Medicine

## 2023-07-16 ENCOUNTER — Telehealth: Payer: Self-pay | Admitting: Physical Therapy

## 2023-07-16 ENCOUNTER — Ambulatory Visit: Payer: BC Managed Care – PPO | Attending: Internal Medicine | Admitting: Physical Therapy

## 2023-07-16 NOTE — Telephone Encounter (Signed)
 Called pt to inquire about absence from apt. She did not respond so left VM instructing her that this was her third no show and that she would be removed from the schedule and to call back if she wanted to be put back on the schedule.

## 2023-07-16 NOTE — Telephone Encounter (Signed)
 Requested Prescriptions  Pending Prescriptions Disp Refills   ezetimibe  (ZETIA ) 10 MG tablet [Pharmacy Med Name: EZETIMIBE  TABS 10MG ] 90 tablet 0    Sig: TAKE 1 TABLET DAILY     Cardiovascular:  Antilipid - Sterol Transport Inhibitors Failed - 07/16/2023  3:31 PM      Failed - Lipid Panel in normal range within the last 12 months    Cholesterol  Date Value Ref Range Status  07/09/2023 124 <200 mg/dL Final  87/89/7984 788 (H) 0 - 200 mg/dL Final   Ldl Cholesterol, Calc  Date Value Ref Range Status  06/22/2014 SEE COMMENT 0 - 100 mg/dL Final    Comment:    LDL/VLDL - Unable to report VLDL and LDL due to a  - Triglyceride value that is 400 mg/dL or   - greater.    LDL Cholesterol (Calc)  Date Value Ref Range Status  07/09/2023 53 mg/dL (calc) Final    Comment:    Reference range: <100 . Desirable range <100 mg/dL for primary prevention;   <70 mg/dL for patients with CHD or diabetic patients  with > or = 2 CHD risk factors. SABRA LDL-C is now calculated using the Martin-Hopkins  calculation, which is a validated novel method providing  better accuracy than the Friedewald equation in the  estimation of LDL-C.  Gladis APPLETHWAITE et al. SANDREA. 7986;689(80): 2061-2068  (http://education.[facility].com/faq/FAQ164)    Direct LDL  Date Value Ref Range Status  11/07/2014 76.0 mg/dL Final    Comment:    Optimal:  <100 mg/dLNear or Above Optimal:  100-129 mg/dLBorderline High:  130-159 mg/dLHigh:  160-189 mg/dLVery High:  >190 mg/dL   HDL Cholesterol  Date Value Ref Range Status  06/22/2014 42 40 - 60 mg/dL Final   HDL  Date Value Ref Range Status  07/09/2023 48 (L) > OR = 50 mg/dL Final   Triglycerides  Date Value Ref Range Status  07/09/2023 151 (H) <150 mg/dL Final  87/89/7984 526 (H) 0 - 200 mg/dL Final         Passed - AST in normal range and within 360 days    AST  Date Value Ref Range Status  07/09/2023 14 10 - 35 U/L Final   SGOT(AST)  Date Value Ref Range Status   06/22/2014 41 (H) 15 - 37 Unit/L Final         Passed - ALT in normal range and within 360 days    ALT  Date Value Ref Range Status  07/09/2023 16 6 - 29 U/L Final   SGPT (ALT)  Date Value Ref Range Status  06/22/2014 45 U/L Final    Comment:    14-63 NOTE: New Reference Range 01/31/14          Passed - Patient is not pregnant      Passed - Valid encounter within last 12 months    Recent Outpatient Visits           1 week ago Type 2 diabetes mellitus with hyperglycemia, without long-term current use of insulin  [facility])   [facility] [place], [place], NP   1 month ago Acute bilateral low back pain with bilateral sciatica   [facility] [place], Angeline ORN, NP   1 month ago Acute bilateral low back pain with bilateral sciatica   [facility] [place] PARAS, DO   4 months ago Overweight with body mass index (BMI) of 27 to 27.9 in  adult   [facility] [place], [place], NP   5 months ago Type 2 diabetes mellitus with hyperglycemia, without long-term current use of insulin  [facility])   [place] [place], Angeline ORN, NP       Future Appointments             In 1 week Baity, Angeline ORN, NP [facility]   In 3 months [place], Angeline ORN, NP [facility], [place]

## 2023-07-17 DIAGNOSIS — J3089 Other allergic rhinitis: Secondary | ICD-10-CM | POA: Diagnosis not present

## 2023-07-17 DIAGNOSIS — J3081 Allergic rhinitis due to animal (cat) (dog) hair and dander: Secondary | ICD-10-CM | POA: Diagnosis not present

## 2023-07-17 DIAGNOSIS — J301 Allergic rhinitis due to pollen: Secondary | ICD-10-CM | POA: Diagnosis not present

## 2023-07-20 ENCOUNTER — Ambulatory Visit: Payer: BC Managed Care – PPO | Admitting: Physical Therapy

## 2023-07-21 DIAGNOSIS — J3081 Allergic rhinitis due to animal (cat) (dog) hair and dander: Secondary | ICD-10-CM | POA: Diagnosis not present

## 2023-07-21 DIAGNOSIS — J3089 Other allergic rhinitis: Secondary | ICD-10-CM | POA: Diagnosis not present

## 2023-07-21 DIAGNOSIS — J301 Allergic rhinitis due to pollen: Secondary | ICD-10-CM | POA: Diagnosis not present

## 2023-07-22 ENCOUNTER — Ambulatory Visit: Payer: BC Managed Care – PPO

## 2023-07-23 ENCOUNTER — Ambulatory Visit: Payer: BC Managed Care – PPO | Admitting: Internal Medicine

## 2023-07-23 DIAGNOSIS — J3089 Other allergic rhinitis: Secondary | ICD-10-CM | POA: Diagnosis not present

## 2023-07-23 DIAGNOSIS — J3081 Allergic rhinitis due to animal (cat) (dog) hair and dander: Secondary | ICD-10-CM | POA: Diagnosis not present

## 2023-07-23 DIAGNOSIS — J301 Allergic rhinitis due to pollen: Secondary | ICD-10-CM | POA: Diagnosis not present

## 2023-07-23 MED ORDER — METHOCARBAMOL 500 MG PO TABS: 500.0000 mg | ORAL_TABLET | Freq: Three times a day (TID) | ORAL | 0 refills | Status: DC | PRN

## 2023-07-27 ENCOUNTER — Encounter: Payer: BC Managed Care – PPO | Admitting: Physical Therapy

## 2023-07-28 ENCOUNTER — Other Ambulatory Visit: Payer: Self-pay

## 2023-07-29 ENCOUNTER — Encounter: Payer: BC Managed Care – PPO | Admitting: Physical Therapy

## 2023-07-30 ENCOUNTER — Other Ambulatory Visit (HOSPITAL_COMMUNITY): Payer: Self-pay

## 2023-07-30 ENCOUNTER — Other Ambulatory Visit (HOSPITAL_COMMUNITY): Payer: Self-pay | Admitting: Pharmacy Technician

## 2023-07-30 NOTE — Progress Notes (Signed)
Specialty Pharmacy Refill Coordination Note  [name] is a 68 y.o. female contacted today regarding refills of specialty medication(s) Dolutegravir-lamiVUDine (Dovato)   Patient requested Delivery   Delivery date: 08/06/23   Verified address: [address] [place]   Medication will be filled on 08/05/23.

## 2023-08-03 ENCOUNTER — Encounter: Payer: BC Managed Care – PPO | Admitting: Physical Therapy

## 2023-08-04 ENCOUNTER — Encounter: Payer: Self-pay | Admitting: Internal Medicine

## 2023-08-04 ENCOUNTER — Ambulatory Visit
Admission: RE | Admit: 2023-08-04 | Discharge: 2023-08-04 | Disposition: A | Discharge status: Home / Self Care | Payer: BC Managed Care – PPO | Source: Ambulatory Visit | Attending: Internal Medicine | Admitting: Internal Medicine

## 2023-08-04 ENCOUNTER — Ambulatory Visit
Admission: RE | Admit: 2023-08-04 | Discharge: 2023-08-04 | Disposition: A | Discharge status: Home / Self Care | Payer: BC Managed Care – PPO | Source: Home / Self Care | Attending: Urology | Admitting: Urology

## 2023-08-04 ENCOUNTER — Ambulatory Visit (INDEPENDENT_AMBULATORY_CARE_PROVIDER_SITE_OTHER): Payer: BC Managed Care – PPO | Admitting: Internal Medicine

## 2023-08-04 VITALS — BP 128/84 | Ht 62.0 in | Wt 159.0 lb

## 2023-08-04 DIAGNOSIS — M25531 Pain in right wrist: Secondary | ICD-10-CM | POA: Diagnosis not present

## 2023-08-04 MED ORDER — NAPROXEN 375 MG PO TABS: 375.0000 mg | ORAL_TABLET | Freq: Two times a day (BID) | ORAL | 0 refills | Status: DC
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
## 2023-08-04 NOTE — Patient Instructions (Signed)
Wrist and Forearm Exercises Ask your health care provider which exercises are safe for you. Do exercises exactly as told by your provider and adjust them as directed. It is normal to feel mild stretching, pulling, tightness, or discomfort as you do these exercises. Stop right away if you feel sudden pain or your pain gets worse. Do not begin these exercises until told by your provider. Range-of-motion exercises These exercises warm up your muscles and joints and improve the movement and flexibility of your injured wrist and forearm. These exercises also help to relieve pain, numbness, and tingling. These exercises are done using the muscles in your injured wrist and forearm. Wrist flexion  Bend your left / right elbow to a 90-degree angle (right angle) with your palm facing the floor. Bend your wrist so that your fingers point toward the floor (flexion). Hold this position for __________ seconds. Slowly return to the starting position. Repeat __________ times. Complete this exercise __________ times a day. Wrist extension  Bend your left / right elbow to a 90-degree angle (right angle) with your palm facing the floor. Bend your wrist so that your fingers point toward the ceiling (extension). Hold this position for __________ seconds. Slowly return to the starting position. Repeat __________ times. Complete this exercise __________ times a day. Ulnar deviation  Bend your left / right elbow to a 90-degree angle (right angle) and rest your forearm on a table with your palm facing down. Keeping your hand flat on the table, bend your left /right wrist toward your small finger (pinkie). This is ulnar deviation. Hold this position for __________ seconds. Slowly return to the starting position. Repeat __________ times. Complete this exercise __________ times a day. Radial deviation  Bend your left / right elbow to a 90-degree angle (right angle) and rest your forearm on a table with your palm  facing down. Keeping your hand flat on the table, bend your left / right wrist toward your thumb. This is radial deviation. Hold this position for __________ seconds. Slowly return to the starting position. Repeat __________ times. Complete this exercise __________ times a day. Stretching These exercises warm up your muscles and joints and improve the movement and flexibility of your injured wrist and forearm. These exercises also help to relieve pain, numbness, and tingling. These exercises are done using your healthy arm to help stretch the muscles in your injured wrist and forearm. Wrist flexion  Reach your left / right arm out in front of you and turn your palm down toward the floor. If told by your provider, bend your left / right elbow to a 90-degree angle (right angle) at your side. Using your uninjured hand, gently press over the back of your left / right hand to bend your wrist and fingers toward the floor (flexion). Go as far as you can to feel a stretch without causing pain. Hold this position for __________ seconds. Slowly return to the starting position. Repeat __________ times. Complete this exercise __________ times a day. Wrist extension  Reach your left / right arm out in front of you and turn your palm up toward the ceiling. If told by your provider, bend your left / right elbow to a 90-degree angle (right angle) at your side. Using your uninjured hand, gently press over the palm of your left / right hand to bend your wrist and fingers toward the floor (extension). Go as far as you can to feel a stretch without causing pain. Hold this position for __________ seconds.  Slowly return to the starting position. Repeat __________ times. Complete this exercise __________ times a day. Forearm rotation, supination  Sit with your left / right elbow bent to a 90-degree angle (right angle). Turn (rotate) your left / right palm up until you cannot rotate it any more (supination). Then,  use your other hand to help turn your left / right forearm more. Hold this position for __________ seconds. Slowly return to the starting position. Repeat __________ times. Complete this exercise __________ times a day. Forearm rotation, pronation  Sit with your left / right elbow bent to a 90-degree angle (right angle). Turn (rotate) your left / right palm down (pronation) until you cannot rotate it any more. Then, use your other hand to help turn your left / right forearm more. Hold this position for __________ seconds. Slowly return to the starting position. Repeat __________ times. Complete this exercise __________ times a day. Strengthening exercises These exercises build strength and endurance in your wrist and forearm. Endurance is the ability to use your muscles for a long time, even after they get tired. Wrist flexion  Sit with your left / right forearm resting on a table or other surface. Bend your elbow to a 90-degree angle (right angle) and rest your hand palm-up over the edge of the table. Hold a __________ weight in your left / right hand or hold a rubber exercise band or tube. Place your left / right hand above the other hand. There should be slight tension in the exercise band or tube. Slowly curl your hand up toward the ceiling (flexion). Hold this position for __________ seconds. Slowly lower your hand back to the starting position. Repeat __________ times. Complete this exercise __________ times a day. Wrist extension  Sit with your left / right forearm resting on a table or other surface. Bend your elbow to a 90-degree angle (right angle) and rest your hand palm-down over the edge of the table. Hold a __________ weight in your left / right hand or hold a rubber exercise band or tube. Place your left / right hand above the other hand. There should be slight tension in the exercise band or tube. Slowly curl your hand up toward the ceiling (extension). Hold this position  for __________ seconds. Slowly lower your hand back to the starting position. Repeat __________ times. Complete this exercise __________ times a day. Forearm rotation, supination  Sit with your left / right forearm resting on a table or other surface. Bend your elbow to a 90-degree angle (right angle). Position your forearm so that your thumb is facing the ceiling (neutral position) and your hand is resting over the edge of the table. Hold a hammer in your left / right hand. This exercise will be easier if you hold the hammer near the head of the hammer. This exercise will be harder if you hold the hammer near the end of the handle. Without moving your elbow, slowly rotate your palm up toward the ceiling (supination). Hold this position for __________ seconds. Slowly return to the starting position. Repeat __________ times. Complete this exercise __________ times a day. Forearm rotation, pronation  Sit with your left / right forearm resting on a table or other surface. Bend your elbow to a 90-degree angle (right angle). Position your forearm so that the thumb is facing the ceiling (neutral position), with your hand resting over the edge of the table. Hold a hammer in your left / right hand. This exercise will be easier if  you hold the hammer near the head of the hammer. This exercise will be harder if you hold the hammer near the end of the handle. Without moving your elbow, slowly rotate your palm down toward the floor (pronation). Hold this position for __________ seconds. Slowly return to the starting position. Repeat __________ times. Complete this exercise __________ times a day. Grip strengthening  Grasp a stress ball or other ball in the middle of your left / right hand. Start with your elbow bent to a 90-degree angle (right angle). Slowly increasing the pressure, squeeze the ball as hard as you can without causing pain. Think of bringing the tips of your fingers into the middle of  your palm. All of your finger joints should bend when doing this exercise. To make this exercise harder, slowly try to straighten your elbow in front of you, until you can do the exercise with your elbow fully straight. Hold your squeeze for __________ seconds, then relax. If told by your provider, do this exercise: With your forearm positioned so that the thumb is facing the ceiling (neutral position). With your forearm turned palm-down. With your forearm turned palm-up. Repeat __________ times. Complete this exercise __________ times a day. This information is not intended to replace advice given to you by your health care provider. Make sure you discuss any questions you have with your health care provider. Document Revised: 04/23/2022 Document Reviewed: 04/23/2022 Elsevier Patient Education  2024 [employer].

## 2023-08-11 ENCOUNTER — Encounter: Payer: BC Managed Care – PPO | Admitting: Physical Therapy

## 2023-08-11 DIAGNOSIS — J3089 Other allergic rhinitis: Secondary | ICD-10-CM | POA: Diagnosis not present

## 2023-08-11 DIAGNOSIS — J301 Allergic rhinitis due to pollen: Secondary | ICD-10-CM | POA: Diagnosis not present

## 2023-08-11 DIAGNOSIS — J3081 Allergic rhinitis due to animal (cat) (dog) hair and dander: Secondary | ICD-10-CM | POA: Diagnosis not present

## 2023-08-13 ENCOUNTER — Other Ambulatory Visit: Payer: Self-pay | Admitting: Internal Medicine

## 2023-08-13 ENCOUNTER — Encounter: Payer: BC Managed Care – PPO | Admitting: Physical Therapy

## 2023-08-14 NOTE — Telephone Encounter (Signed)
Requested medications are due for refill today.  yes  Requested medications are on the active medications list.  yes  Last refill. 08/04/2023 #14 0 rf  Future visit scheduled.   yes  Notes to clinic.  New medication to this pt.    Requested Prescriptions  Pending Prescriptions Disp Refills   naproxen (NAPROSYN) 375 MG tablet [Pharmacy Med Name: NAPROXEN 375MG  TABLETS] 14 tablet 0    Sig: TAKE 1 TABLET(375 MG) BY MOUTH TWICE DAILY WITH A MEAL     Analgesics:  NSAIDS Failed - 08/14/2023 12:55 PM      Failed - Manual Review: Labs are only required if the patient has taken medication for more than 8 weeks.      Passed - Cr in normal range and within 360 days    Creat  Date Value Ref Range Status  07/09/2023 0.90 0.50 - 1.05 mg/dL Final   Creatinine,U  Date Value Ref Range Status  09/25/2020 145.4 mg/dL Final   Creatinine, Urine  Date Value Ref Range Status  10/30/2022 215 20 - 275 mg/dL Final         Passed - HGB in normal range and within 360 days    Hemoglobin  Date Value Ref Range Status  07/09/2023 11.8 11.7 - 15.5 g/dL Final   HGB  Date Value Ref Range Status  06/23/2014 12.2 12.0 - 16.0 g/dL Final         Passed - PLT in normal range and within 360 days    Platelets  Date Value Ref Range Status  07/09/2023 316 140 - 400 Thousand/uL Final   Platelet  Date Value Ref Range Status  06/23/2014 251 150 - 440 x10 3/mm 3 Final         Passed - HCT in normal range and within 360 days    HCT  Date Value Ref Range Status  07/09/2023 37.4 35.0 - 45.0 % Final  06/23/2014 37.2 35.0 - 47.0 % Final         Passed - eGFR is 30 or above and within 360 days    GFR, Est African American  Date Value Ref Range Status  11/26/2020 66 > OR = 60 mL/min/1.10m2 Final   GFR, Est Non African American  Date Value Ref Range Status  11/26/2020 57 (L) > OR = 60 mL/min/1.62m2 Final   GFR  Date Value Ref Range Status  09/25/2020 61.38 >60.00 mL/min Final    Comment:     Calculated using the CKD-EPI Creatinine Equation (2021)   eGFR  Date Value Ref Range Status  07/09/2023 71 > OR = 60 mL/min/1.60m2 Final         Passed - Patient is not pregnant      Passed - Valid encounter within last 12 months    Recent Outpatient Visits           1 week ago Right wrist pain   [facility] [place], Salvadore Oxford, NP   1 month ago Type 2 diabetes mellitus with hyperglycemia, without long-term current use of insulin [facility])   [place], Salvadore Oxford, NP   2 months ago Acute bilateral low back pain with bilateral sciatica   [facility] [place], Salvadore Oxford, NP   2 months ago Acute bilateral low back pain with bilateral sciatica   [place] [facility] S[facility]ty Cords, DO   5 months ago Overweight with body mass  index (BMI) of 27 to 27.9 in adult   [facility] [place], Salvadore Oxford, NP       Future Appointments             In 2 months Baity, Salvadore Oxford, NP [place] [facility], [facility]

## 2023-08-17 ENCOUNTER — Other Ambulatory Visit: Payer: Self-pay | Admitting: Internal Medicine

## 2023-08-17 DIAGNOSIS — E1165 Type 2 diabetes mellitus with hyperglycemia: Secondary | ICD-10-CM

## 2023-08-18 ENCOUNTER — Encounter: Payer: BC Managed Care – PPO | Admitting: Physical Therapy

## 2023-08-18 NOTE — Telephone Encounter (Signed)
 Requested Prescriptions  Pending Prescriptions Disp Refills   sitaGLIPtin  (JANUVIA ) 100 MG tablet [Pharmacy Med Name: JANUVIA  TABS 100MG ] 90 tablet 1    Sig: TAKE 1 TABLET DAILY     Endocrinology:  Diabetes - DPP-4 Inhibitors Failed - 08/18/2023  9:40 AM      Failed - HBA1C is between 0 and 7.9 and within 180 days    Hemoglobin A1C  Date Value Ref Range Status  07/09/2023 9.3 (A) 4.0 - 5.6 % Final  06/22/2014 7.5 (H) 4.2 - 6.3 % Final    Comment:    The American Diabetes Association recommends that a primary goal of therapy should be <7% and that physicians should reevaluate the treatment regimen in patients with HbA1c values consistently >8%.    HbA1c, POC (controlled diabetic range)  Date Value Ref Range Status  01/26/2023 7.0 0.0 - 7.0 % Final         Passed - Cr in normal range and within 360 days    Creat  Date Value Ref Range Status  07/09/2023 0.90 0.50 - 1.05 mg/dL Final   Creatinine,U  Date Value Ref Range Status  09/25/2020 145.4 mg/dL Final   Creatinine, Urine  Date Value Ref Range Status  10/30/2022 215 20 - 275 mg/dL Final         Passed - Valid encounter within last 6 months    Recent Outpatient Visits           2 weeks ago Right wrist pain   [place] [facility] [place], Angeline ORN, NP   1 month ago Type 2 diabetes mellitus with hyperglycemia, without long-term current use of insulin  [facility])   [place] [facility] [place], [place] W, NP   2 months ago Acute bilateral low back pain with bilateral sciatica   [place] [facility] [place], [place] W, NP   3 months ago Acute bilateral low back pain with bilateral sciatica   [place] [facility] [place], Marsa PARAS, DO   5 months ago Overweight with body mass index (BMI) of 27 to 27.9 in adult   [facility] [place], Angeline ORN, NP       Future Appointments             In 2 months Baity, Angeline ORN, NP [place]  [facility]

## 2023-08-20 ENCOUNTER — Encounter: Payer: BC Managed Care – PPO | Admitting: Physical Therapy

## 2023-08-23 ENCOUNTER — Other Ambulatory Visit: Payer: Self-pay | Admitting: Internal Medicine

## 2023-08-24 ENCOUNTER — Other Ambulatory Visit: Payer: Self-pay

## 2023-08-24 ENCOUNTER — Encounter: Payer: BC Managed Care – PPO | Admitting: Physical Therapy

## 2023-08-24 MED ORDER — GLIPIZIDE 10 MG PO TABS: 10.0000 mg | ORAL_TABLET | Freq: Two times a day (BID) | ORAL | 2 refills | Status: DC

## 2023-08-25 NOTE — Telephone Encounter (Signed)
Requested medication (s) are due for refill today - yes  Requested medication (s) are on the active medication list -yes  Future visit scheduled -yes  Last refill: 07/23/23 #30  Notes to clinic: non delegated Rx  Requested Prescriptions  Pending Prescriptions Disp Refills   methocarbamol (ROBAXIN) 500 MG tablet [Pharmacy Med Name: METHOCARBAMOL TABS 500MG ] 30 tablet 35    Sig: TAKE 1 TABLET EVERY 8 HOURS AS NEEDED     Not Delegated - Analgesics:  Muscle Relaxants Failed - 08/25/2023  8:03 AM      Failed - This refill cannot be delegated      Passed - Valid encounter within last 6 months    Recent Outpatient Visits           3 weeks ago Right wrist pain   [place] [facility] [place], [place] W, NP   1 month ago Type 2 diabetes mellitus with hyperglycemia, without long-term current use of insulin [facility])   [place] [facility] [place], [place] W, NP   2 months ago Acute bilateral low back pain with bilateral sciatica   [place] [facility] [place], [place] W, NP   3 months ago Acute bilateral low back pain with bilateral sciatica   [place] [facility] [place], Netta Neat, DO   6 months ago Overweight with body mass index (BMI) of 27 to 27.9 in adult   [facility] [place], Salvadore [place], NP       Future Appointments             In 2 months Baity, Salvadore [place], NP [place] [facility]               Requested Prescriptions  Pending Prescriptions Disp Refills   methocarbamol (ROBAXIN) 500 MG tablet [Pharmacy Med Name: METHOCARBAMOL TABS 500MG ] 30 tablet 35    Sig: TAKE 1 TABLET EVERY 8 HOURS AS NEEDED     Not Delegated - Analgesics:  Muscle Relaxants Failed - 08/25/2023  8:03 AM      Failed - This refill cannot be delegated      Passed - Valid encounter within last 6 months    Recent Outpatient Visits           3 weeks ago Right wrist pain   [place]  [facility] [place], [place] W, NP   1 month ago Type 2 diabetes mellitus with hyperglycemia, without long-term current use of insulin [facility])   [place] [facility] [place], [place] W, NP   2 months ago Acute bilateral low back pain with bilateral sciatica   [place] [facility] [place], [place] W, NP   3 months ago Acute bilateral low back pain with bilateral sciatica   [place] [facility] [place], Netta Neat, DO   6 months ago Overweight with body mass index (BMI) of 27 to 27.9 in adult   [facility] [place], Salvadore Oxford, NP       Future Appointments             In 2 months Baity, Salvadore Oxford, NP  [facility], [facility]

## 2023-08-26 ENCOUNTER — Telehealth: Payer: Self-pay

## 2023-08-26 ENCOUNTER — Encounter: Payer: BC Managed Care – PPO | Admitting: Physical Therapy

## 2023-08-26 NOTE — Telephone Encounter (Signed)
Patient was identified as falling into the [place] Measure - Diabetes.   Patient was: Patient refuses intervention. Has upcoming physical in April.

## 2023-08-27 ENCOUNTER — Other Ambulatory Visit: Payer: Self-pay

## 2023-08-27 ENCOUNTER — Ambulatory Visit: Payer: BC Managed Care – PPO | Admitting: Sports Medicine

## 2023-08-27 VITALS — BP 132/84 | Ht 62.0 in | Wt 159.0 lb

## 2023-08-27 DIAGNOSIS — J3081 Allergic rhinitis due to animal (cat) (dog) hair and dander: Secondary | ICD-10-CM | POA: Diagnosis not present

## 2023-08-27 DIAGNOSIS — J301 Allergic rhinitis due to pollen: Secondary | ICD-10-CM | POA: Diagnosis not present

## 2023-08-27 DIAGNOSIS — J3089 Other allergic rhinitis: Secondary | ICD-10-CM | POA: Diagnosis not present

## 2023-08-27 DIAGNOSIS — S63501A Unspecified sprain of right wrist, initial encounter: Secondary | ICD-10-CM

## 2023-08-27 MED ORDER — MELOXICAM 15 MG PO TABS: 15.0000 mg | ORAL_TABLET | Freq: Every day | ORAL | 0 refills | Status: DC

## 2023-08-27 NOTE — Progress Notes (Signed)
[name] D.[facility] [address] [place] [address] Phone: [phone]   Assessment and Plan:    1. Right wrist sprain, initial encounter -Acute, initial sports medicine visit - Consistent with sprain of right wrist that occurred during FOOSH injury - Patient does have tenderness over snuffbox, however right wrist x-ray was obtained 1 to 2 weeks after fall and did not show any evidence of scaphoid fracture which should have been clearly visible by this time if fracture was present.  No repeat imaging at today's visit - Reviewed patient's x-ray with patient in clinic.  My interpretation: No acute fracture or dislocation. - Start meloxicam 15 mg daily x2 weeks.  If still having pain after 2 weeks, complete 3rd-week of NSAID. May use remaining NSAID as needed once daily for pain control.  Do not to use additional over-the-counter NSAIDs (ibuprofen, naproxen, Advil, Aleve) while taking prescription NSAIDs.  May use Tylenol [phone] mg 2 to 3 times a day for breakthrough pain. - Recommend using thumb spica brace day and night for the next 2 to 4 weeks.  Gradually discontinue brace use as tolerated - Start HEP for wrist.  Recommend coming out of brace at least twice daily to decrease wrist stiffness   Pertinent previous records reviewed include internal medicine note 08/04/2023, right wrist x-ray 08/04/23    Follow Up: 4 weeks for reevaluation.  If no improvement or worsening of symptoms, could consider ultrasound versus advanced imaging versus physical therapy   Subjective:   I, [name], am serving as a Neurosurgeon for Doctor Richardean Sale  Chief Complaint:  R wrist pain   HPI:   08/27/23 Patient is a 68 year old female with wrist pain. Patient states right wrist pain for about a month or so. Naproxen does not help. She stopped her self from fall and caught herself on a wall. Decreased ROM due to pain. When flared the pain radiates up the  arm. Decrease in grip strength. Pain with ADL. No numnbess or tingling. She has constant pain. She does type for work   Relevant Historical Information: DM type II, hypertension,   CKD, history of CVA.  Ulcerative colitis documented in chart, however patient takes no medications and is not followed by GI  Additional pertinent review of systems negative.   Current Outpatient Medications:    meloxicam (MOBIC) 15 MG tablet, Take 1 tablet (15 mg total) by mouth daily., Disp: 30 tablet, Rfl: 0   albuterol (VENTOLIN HFA) 108 (90 Base) MCG/ACT inhaler, Inhale 2 puffs into the lungs every 4 (four) hours as needed for wheezing or shortness of breath., Disp: 8 g, Rfl: 2   aspirin EC 81 MG EC tablet, Take 1 tablet (81 mg total) by mouth daily., Disp: , Rfl:    atorvastatin (LIPITOR) 40 MG tablet, Take 40 mg by mouth daily., Disp: , Rfl:    azelastine (ASTELIN) 0.1 % nasal spray, Place 2 sprays into both nostrils 2 (two) times daily. Use in each nostril as directed (Patient taking differently: Place 2 sprays into both nostrils as needed. Use in each nostril as directed), Disp: 30 mL, Rfl: 12   B-D UF III MINI PEN NEEDLES 31G X 5 MM MISC, USE TO INJECT INSULIN SIX TIMES DAILY, Disp: 90 each, Rfl: 23   dolutegravir-lamiVUDine (DOVATO) 50-300 MG tablet, Take 1 tablet by mouth daily., Disp: 30 tablet, Rfl: 11   ezetimibe (ZETIA) 10 MG tablet, TAKE 1 TABLET DAILY, Disp: 90 tablet, Rfl:  0   glipiZIDE (GLUCOTROL) 10 MG tablet, Take 1 tablet (10 mg total) by mouth 2 (two) times daily before a meal., Disp: 180 tablet, Rfl: 2   glucose 4 GM chewable tablet, Chew 1 tablet (4 g total) by mouth as needed for low blood sugar., Disp: 30 tablet, Rfl: 3   glucose blood (FREESTYLE LITE) test strip, Use to test blood sugar twice a day DX: E11.9, Disp: 100 strip, Rfl: 5   methocarbamol (ROBAXIN) 500 MG tablet, TAKE 1 TABLET EVERY 8 HOURS AS NEEDED, Disp: 30 tablet, Rfl: 0   montelukast (SINGULAIR) 10 MG tablet, Take 1 tablet  (10 mg total) by mouth at bedtime., Disp: 90 tablet, Rfl: 3   naproxen (NAPROSYN) 375 MG tablet, Take 1 tablet (375 mg total) by mouth 2 (two) times daily with a meal., Disp: 14 tablet, Rfl: 0   potassium chloride SA (KLOR-CON M20) 20 MEQ tablet, TAKE 1 TABLET DAILY, Disp: 90 tablet, Rfl: 1   rosuvastatin (CRESTOR) 10 MG tablet, TAKE 1 TABLET DAILY, Disp: 90 tablet, Rfl: 3   sitaGLIPtin (JANUVIA) 100 MG tablet, TAKE 1 TABLET DAILY, Disp: 90 tablet, Rfl: 1   TRESIBA FLEXTOUCH 100 UNIT/ML FlexTouch Pen, INJECT 12 UNITS INTO THE SKIN DAILY, Disp: 15 mL, Rfl: 2   triamcinolone cream (KENALOG) 0.1 %, Apply 1 Application topically 2 (two) times daily., Disp: 30 g, Rfl: 0   Objective:     Vitals:   08/27/23 1047  BP: 132/84  Weight: 159 lb (72.1 kg)  Height: 5\' 2"  (1.575 m)      Body mass index is 29.08 kg/m.    Physical Exam:    General: Appears well, nad, nontoxic and pleasant Neuro:sensation intact, strength is 5/5 in upper extremities, muscle tone wnl Skin:no susupicious lesions or rashes  Right hand/Wrist:   No deformity or swelling appreciated. ROM  Ext 70, flexion 60, radial/ulnar deviation 15 TTP snuffbox, dorsal carpals, volar carpals, first MCP, CMC nttp over the   radial styloid, ulnar styloid,  , tfcc Negative Tinel's, Phalen's, Prayer Tests Positive finklestein Neg tfcc bounce test   pain with resisted ext, flex or deviation    Electronically signed by:  [name] D.[facility] 11:33 AM 08/27/23

## 2023-08-27 NOTE — Patient Instructions (Signed)
-   Start meloxicam 15 mg daily x2 weeks.  If still having pain after 2 weeks, complete 3rd-week of NSAID. May use remaining NSAID as needed once daily for pain control.  Do not to use additional over-the-counter NSAIDs (ibuprofen, naproxen, Advil, Aleve) while taking prescription NSAIDs.  May use Tylenol [phone] mg 2 to 3 times a day for breakthrough pain. Wrist HEP  Wrist brace wear it day and night come out 2 times a day  4 week follow up

## 2023-08-31 ENCOUNTER — Other Ambulatory Visit: Payer: Self-pay

## 2023-08-31 ENCOUNTER — Encounter: Payer: BC Managed Care – PPO | Admitting: Physical Therapy

## 2023-09-02 ENCOUNTER — Other Ambulatory Visit: Payer: Self-pay

## 2023-09-02 ENCOUNTER — Encounter: Payer: BC Managed Care – PPO | Admitting: Physical Therapy

## 2023-09-04 DIAGNOSIS — J3081 Allergic rhinitis due to animal (cat) (dog) hair and dander: Secondary | ICD-10-CM | POA: Diagnosis not present

## 2023-09-04 DIAGNOSIS — J3089 Other allergic rhinitis: Secondary | ICD-10-CM | POA: Diagnosis not present

## 2023-09-04 DIAGNOSIS — J301 Allergic rhinitis due to pollen: Secondary | ICD-10-CM | POA: Diagnosis not present

## 2023-09-07 ENCOUNTER — Other Ambulatory Visit: Payer: Self-pay

## 2023-09-07 ENCOUNTER — Other Ambulatory Visit (HOSPITAL_COMMUNITY): Payer: Self-pay

## 2023-09-07 ENCOUNTER — Encounter: Payer: BC Managed Care – PPO | Admitting: Physical Therapy

## 2023-09-07 NOTE — Progress Notes (Signed)
 Specialty Pharmacy Refill Coordination Note  [name] is a 68 y.o. female contacted today regarding refills of specialty medication(s) Dolutegravir-lamiVUDine (Dovato)   Patient requested Delivery   Delivery date: 09/09/23   Verified address: [address] [place] [address]   Medication will be filled on 09/08/23.

## 2023-09-08 ENCOUNTER — Other Ambulatory Visit: Payer: Self-pay

## 2023-09-14 ENCOUNTER — Telehealth: Payer: Self-pay

## 2023-09-14 NOTE — Telephone Encounter (Signed)
A1c check

## 2023-09-17 DIAGNOSIS — J3089 Other allergic rhinitis: Secondary | ICD-10-CM | POA: Diagnosis not present

## 2023-09-17 DIAGNOSIS — J3081 Allergic rhinitis due to animal (cat) (dog) hair and dander: Secondary | ICD-10-CM | POA: Diagnosis not present

## 2023-09-17 DIAGNOSIS — J301 Allergic rhinitis due to pollen: Secondary | ICD-10-CM | POA: Diagnosis not present

## 2023-09-21 ENCOUNTER — Telehealth: Payer: Self-pay

## 2023-09-21 NOTE — Telephone Encounter (Signed)
 Patient was identified as falling into the [place] Measure - Diabetes.   Patient was: Appointment scheduled with primary care provider in the next 30 days.

## 2023-09-23 NOTE — Progress Notes (Deleted)
 [name] D.[facility] [address] [place] [address] Phone: [phone]   Assessment and Plan:     There are no diagnoses linked to this encounter.  ***   Pertinent previous records reviewed include ***    Follow Up: ***     Subjective:   I, [name], am serving as a Neurosurgeon for Doctor Richardean Sale   Chief Complaint:  R wrist pain    HPI:    08/27/23 Patient is a 68 year old female with wrist pain. Patient states right wrist pain for about a month or so. Naproxen does not help. She stopped her self from fall and caught herself on a wall. Decreased ROM due to pain. When flared the pain radiates up the arm. Decrease in grip strength. Pain with ADL. No numnbess or tingling. 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  Ulcerative colitis documented in chart, however patient takes no medications and is not followed by GI    Additional pertinent review of systems negative.   Current Outpatient Medications:    albuterol (VENTOLIN HFA) 108 (90 Base) MCG/ACT inhaler, Inhale 2 puffs into the lungs every 4 (four) hours as needed for wheezing or shortness of breath., Disp: 8 g, Rfl: 2   aspirin EC 81 MG EC tablet, Take 1 tablet (81 mg total) by mouth daily., Disp: , Rfl:    atorvastatin (LIPITOR) 40 MG tablet, Take 40 mg by mouth daily., Disp: , Rfl:    azelastine (ASTELIN) 0.1 % nasal spray, Place 2 sprays into both nostrils 2 (two) times daily. Use in each nostril as directed (Patient taking differently: Place 2 sprays into both nostrils as needed. Use in each nostril as directed), Disp: 30 mL, Rfl: 12   B-D UF III MINI PEN NEEDLES 31G X 5 MM MISC, USE TO INJECT INSULIN SIX TIMES DAILY, Disp: 90 each, Rfl: 23   dolutegravir-lamiVUDine (DOVATO) 50-300 MG tablet, Take 1 tablet by mouth daily., Disp: 30 tablet, Rfl:  11   ezetimibe (ZETIA) 10 MG tablet, TAKE 1 TABLET DAILY, Disp: 90 tablet, Rfl: 0   glipiZIDE (GLUCOTROL) 10 MG tablet, Take 1 tablet (10 mg total) by mouth 2 (two) times daily before a meal., Disp: 180 tablet, Rfl: 2   glucose 4 GM chewable tablet, Chew 1 tablet (4 g total) by mouth as needed for low blood sugar., Disp: 30 tablet, Rfl: 3   glucose blood (FREESTYLE LITE) test strip, Use to test blood sugar twice a day DX: E11.9, Disp: 100 strip, Rfl: 5   meloxicam (MOBIC) 15 MG tablet, Take 1 tablet (15 mg total) by mouth daily., Disp: 30 tablet, Rfl: 0   methocarbamol (ROBAXIN) 500 MG tablet, TAKE 1 TABLET EVERY 8 HOURS AS NEEDED, Disp: 30 tablet, Rfl: 0   montelukast (SINGULAIR) 10 MG tablet, Take 1 tablet (10 mg total) by mouth at bedtime., Disp: 90 tablet, Rfl: 3   naproxen (NAPROSYN) 375 MG tablet, Take 1 tablet (375 mg total) by mouth 2 (two) times daily with a meal., Disp: 14 tablet, Rfl: 0   potassium chloride SA (KLOR-CON M20) 20 MEQ tablet, TAKE 1 TABLET DAILY, Disp: 90 tablet, Rfl: 1   rosuvastatin (CRESTOR) 10 MG tablet, TAKE 1 TABLET DAILY, Disp: 90 tablet, Rfl: 3   sitaGLIPtin (JANUVIA) 100 MG tablet, TAKE 1 TABLET DAILY, Disp: 90  tablet, Rfl: 1   TRESIBA FLEXTOUCH 100 UNIT/ML FlexTouch Pen, INJECT 12 UNITS INTO THE SKIN DAILY, Disp: 15 mL, Rfl: 2   triamcinolone cream (KENALOG) 0.1 %, Apply 1 Application topically 2 (two) times daily., Disp: 30 g, Rfl: 0   Objective:     There were no vitals filed for this visit.    There is no height or weight on file to calculate BMI.    Physical Exam:    ***   Electronically signed by:  [name] D.[facility] 7:46 AM 09/23/23

## 2023-09-24 ENCOUNTER — Ambulatory Visit: Payer: BC Managed Care – PPO | Admitting: Sports Medicine

## 2023-09-24 ENCOUNTER — Other Ambulatory Visit (HOSPITAL_COMMUNITY): Payer: Self-pay

## 2023-09-25 DIAGNOSIS — J301 Allergic rhinitis due to pollen: Secondary | ICD-10-CM | POA: Diagnosis not present

## 2023-09-25 DIAGNOSIS — J3089 Other allergic rhinitis: Secondary | ICD-10-CM | POA: Diagnosis not present

## 2023-09-25 DIAGNOSIS — J3081 Allergic rhinitis due to animal (cat) (dog) hair and dander: Secondary | ICD-10-CM | POA: Diagnosis not present

## 2023-09-30 ENCOUNTER — Other Ambulatory Visit: Payer: Self-pay

## 2023-10-01 ENCOUNTER — Other Ambulatory Visit: Payer: Self-pay

## 2023-10-02 DIAGNOSIS — J3089 Other allergic rhinitis: Secondary | ICD-10-CM | POA: Diagnosis not present

## 2023-10-02 DIAGNOSIS — J301 Allergic rhinitis due to pollen: Secondary | ICD-10-CM | POA: Diagnosis not present

## 2023-10-02 DIAGNOSIS — J3081 Allergic rhinitis due to animal (cat) (dog) hair and dander: Secondary | ICD-10-CM | POA: Diagnosis not present

## 2023-10-08 DIAGNOSIS — J3089 Other allergic rhinitis: Secondary | ICD-10-CM | POA: Diagnosis not present

## 2023-10-08 DIAGNOSIS — J301 Allergic rhinitis due to pollen: Secondary | ICD-10-CM | POA: Diagnosis not present

## 2023-10-08 DIAGNOSIS — J3081 Allergic rhinitis due to animal (cat) (dog) hair and dander: Secondary | ICD-10-CM | POA: Diagnosis not present

## 2023-10-12 ENCOUNTER — Other Ambulatory Visit: Payer: Self-pay | Admitting: Internal Medicine

## 2023-10-13 NOTE — Telephone Encounter (Signed)
 Requested Prescriptions  Pending Prescriptions Disp Refills   ezetimibe (ZETIA) 10 MG tablet [Pharmacy Med Name: EZETIMIBE TABS 10MG ] 90 tablet 1    Sig: TAKE 1 TABLET DAILY     Cardiovascular:  Antilipid - Sterol Transport Inhibitors Failed - 10/13/2023  4:22 PM      Failed - Valid encounter within last 12 months    Recent Outpatient Visits   None            Failed - Lipid Panel in normal range within the last 12 months    Cholesterol  Date Value Ref Range Status  07/09/2023 124 <200 mg/dL Final  16/04/9603 540 (H) 0 - 200 mg/dL Final   Ldl Cholesterol, Calc  Date Value Ref Range Status  06/22/2014 SEE COMMENT 0 - 100 mg/dL Final    Comment:    LDL/VLDL - Unable to report VLDL and LDL due to a  - Triglyceride value that is 400 mg/dL or   - greater.    LDL Cholesterol (Calc)  Date Value Ref Range Status  07/09/2023 53 mg/dL (calc) Final    Comment:    Reference range: <100 . Desirable range <100 mg/dL for primary prevention;   <70 mg/dL for patients with CHD or diabetic patients  with > or = 2 CHD risk factors. Marland Kitchen LDL-C is now calculated using the Martin-Hopkins  calculation, which is a validated novel method providing  better accuracy than the Friedewald equation in the  estimation of LDL-C.  Horald Pollen et al. Lenox Ahr. 9811;914(78): 2061-2068  (http://education.[employer].com/faq/FAQ164)    Direct LDL  Date Value Ref Range Status  11/07/2014 76.0 mg/dL Final    Comment:    Optimal:  <100 mg/dLNear or Above Optimal:  100-129 mg/dLBorderline High:  130-159 mg/dLHigh:  160-189 mg/dLVery High:  >190 mg/dL   HDL Cholesterol  Date Value Ref Range Status  06/22/2014 42 40 - 60 mg/dL Final   HDL  Date Value Ref Range Status  07/09/2023 48 (L) > OR = 50 mg/dL Final   Triglycerides  Date Value Ref Range Status  07/09/2023 151 (H) <150 mg/dL Final  29/56/2130 865 (H) 0 - 200 mg/dL Final         Passed - AST in normal range and within 360 days    AST  Date  Value Ref Range Status  07/09/2023 14 10 - 35 U/L Final   SGOT(AST)  Date Value Ref Range Status  06/22/2014 41 (H) 15 - 37 Unit/L Final         Passed - ALT in normal range and within 360 days    ALT  Date Value Ref Range Status  07/09/2023 16 6 - 29 U/L Final   SGPT (ALT)  Date Value Ref Range Status  06/22/2014 45 U/L Final    Comment:    14-63 NOTE: New Reference Range 01/31/14          Passed - Patient is not pregnant

## 2023-10-16 ENCOUNTER — Other Ambulatory Visit: Payer: Self-pay

## 2023-10-16 DIAGNOSIS — J3089 Other allergic rhinitis: Secondary | ICD-10-CM | POA: Diagnosis not present

## 2023-10-16 DIAGNOSIS — J3081 Allergic rhinitis due to animal (cat) (dog) hair and dander: Secondary | ICD-10-CM | POA: Diagnosis not present

## 2023-10-16 DIAGNOSIS — J301 Allergic rhinitis due to pollen: Secondary | ICD-10-CM | POA: Diagnosis not present

## 2023-10-16 NOTE — Progress Notes (Signed)
 Specialty Pharmacy Refill Coordination Note  [name] is a 68 y.o. female contacted today regarding refills of specialty medication(s) Dolutegravir-lamiVUDine (Dovato)   Patient requested (Patient-Rptd) Delivery   Delivery date: (Patient-Rptd) 10/24/23   [place] ([place]ptd[address] [place] [address]   Medication will be filled on 10/23/23.     Unable to deliver on Saturday. Sent MyChart message with updated delivery date of 4/14.

## 2023-10-23 DIAGNOSIS — J301 Allergic rhinitis due to pollen: Secondary | ICD-10-CM | POA: Diagnosis not present

## 2023-10-23 DIAGNOSIS — J3081 Allergic rhinitis due to animal (cat) (dog) hair and dander: Secondary | ICD-10-CM | POA: Diagnosis not present

## 2023-10-23 DIAGNOSIS — J3089 Other allergic rhinitis: Secondary | ICD-10-CM | POA: Diagnosis not present

## 2023-11-04 NOTE — Progress Notes (Unsigned)
 Ben Jackson D.[name] [address] [place] [address] Phone: [phone]   Assessment and Plan:     There are no diagnoses linked to this encounter.  ***   Pertinent previous records reviewed include ***    Follow Up: ***     Subjective:   I, [name], am serving as a Neurosurgeon for Doctor Ulysees Gander   Chief Complaint:  R wrist pain    HPI:    08/27/23 Patient is a 68 year old female with wrist pain. Patient states right wrist pain for about a month or so. Naproxen  does not help. She stopped her self from fall and caught herself on a wall. Decreased ROM due to pain. When flared the pain radiates up the arm. Decrease in grip strength. Pain with ADL. No numnbess or tingling. 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  Ulcerative colitis documented in chart, however patient takes no medications and is not followed by GI  Additional pertinent review of systems negative.   Current Outpatient Medications:    albuterol  (VENTOLIN  HFA) 108 (90 Base) MCG/ACT inhaler, Inhale 2 puffs into the lungs every 4 (four) hours as needed for wheezing or shortness of breath., Disp: 8 g, Rfl: 2   aspirin  EC 81 MG EC tablet, Take 1 tablet (81 mg total) by mouth daily., Disp: , Rfl:    atorvastatin  (LIPITOR) 40 MG tablet, Take 40 mg by mouth daily., Disp: , Rfl:    azelastine  (ASTELIN ) 0.1 % nasal spray, Place 2 sprays into both nostrils 2 (two) times daily. Use in each nostril as directed (Patient taking differently: Place 2 sprays into both nostrils as needed. Use in each nostril as directed), Disp: 30 mL, Rfl: 12   B-D UF III MINI PEN NEEDLES 31G X 5 MM MISC, USE TO INJECT INSULIN  SIX TIMES DAILY, Disp: 90 each, Rfl: 23   dolutegravir -lamiVUDine  (DOVATO ) 50-300 MG tablet, Take 1 tablet by mouth daily., Disp: 30 tablet, Rfl: 11    ezetimibe  (ZETIA ) 10 MG tablet, TAKE 1 TABLET DAILY, Disp: 90 tablet, Rfl: 1   glipiZIDE  (GLUCOTROL ) 10 MG tablet, Take 1 tablet (10 mg total) by mouth 2 (two) times daily before a meal., Disp: 180 tablet, Rfl: 2   glucose 4 GM chewable tablet, Chew 1 tablet (4 g total) by mouth as needed for low blood sugar., Disp: 30 tablet, Rfl: 3   glucose blood (FREESTYLE LITE) test strip, Use to test blood sugar twice a day DX: E11.9, Disp: 100 strip, Rfl: 5   meloxicam  (MOBIC ) 15 MG tablet, Take 1 tablet (15 mg total) by mouth daily., Disp: 30 tablet, Rfl: 0   methocarbamol  (ROBAXIN ) 500 MG tablet, TAKE 1 TABLET EVERY 8 HOURS AS NEEDED, Disp: 30 tablet, Rfl: 0   montelukast  (SINGULAIR ) 10 MG tablet, Take 1 tablet (10 mg total) by mouth at bedtime., Disp: 90 tablet, Rfl: 3   naproxen  (NAPROSYN ) 375 MG tablet, Take 1 tablet (375 mg total) by mouth 2 (two) times daily with a meal., Disp: 14 tablet, Rfl: 0   potassium chloride  SA (KLOR-CON  M20) 20 MEQ tablet, TAKE 1 TABLET DAILY, Disp: 90 tablet, Rfl: 1   rosuvastatin  (CRESTOR ) 10 MG tablet, TAKE 1 TABLET DAILY, Disp: 90 tablet, Rfl: 3   sitaGLIPtin  (JANUVIA ) 100 MG tablet, TAKE 1 TABLET DAILY, Disp: 90 tablet, Rfl: 1  TRESIBA  FLEXTOUCH 100 UNIT/ML FlexTouch Pen, INJECT 12 UNITS INTO THE SKIN DAILY, Disp: 15 mL, Rfl: 2   triamcinolone  cream (KENALOG ) 0.1 %, Apply 1 Application topically 2 (two) times daily., Disp: 30 g, Rfl: 0   Objective:     There were no vitals filed for this visit.    There is no height or weight on file to calculate BMI.    Physical Exam:    ***   Electronically signed by:  Marshall Skeeter D.[name] 7:40 AM 11/04/23

## 2023-11-05 ENCOUNTER — Ambulatory Visit (INDEPENDENT_AMBULATORY_CARE_PROVIDER_SITE_OTHER): Payer: BC Managed Care – PPO | Admitting: Internal Medicine

## 2023-11-05 ENCOUNTER — Telehealth: Payer: Self-pay | Admitting: *Deleted

## 2023-11-05 ENCOUNTER — Ambulatory Visit (INDEPENDENT_AMBULATORY_CARE_PROVIDER_SITE_OTHER): Admitting: Sports Medicine

## 2023-11-05 ENCOUNTER — Other Ambulatory Visit: Payer: Self-pay | Admitting: *Deleted

## 2023-11-05 ENCOUNTER — Telehealth: Payer: Self-pay

## 2023-11-05 ENCOUNTER — Other Ambulatory Visit: Payer: Self-pay | Admitting: Internal Medicine

## 2023-11-05 ENCOUNTER — Encounter: Payer: Self-pay | Admitting: Internal Medicine

## 2023-11-05 VITALS — BP 132/84 | Ht 62.0 in | Wt 153.0 lb

## 2023-11-05 VITALS — BP 126/78 | Ht 62.0 in | Wt 158.0 lb

## 2023-11-05 DIAGNOSIS — E1165 Type 2 diabetes mellitus with hyperglycemia: Secondary | ICD-10-CM | POA: Diagnosis not present

## 2023-11-05 DIAGNOSIS — J301 Allergic rhinitis due to pollen: Secondary | ICD-10-CM | POA: Diagnosis not present

## 2023-11-05 DIAGNOSIS — E663 Overweight: Secondary | ICD-10-CM

## 2023-11-05 DIAGNOSIS — S63501D Unspecified sprain of right wrist, subsequent encounter: Secondary | ICD-10-CM

## 2023-11-05 DIAGNOSIS — Z7984 Long term (current) use of oral hypoglycemic drugs: Secondary | ICD-10-CM

## 2023-11-05 DIAGNOSIS — M79641 Pain in right hand: Secondary | ICD-10-CM | POA: Diagnosis not present

## 2023-11-05 DIAGNOSIS — J3089 Other allergic rhinitis: Secondary | ICD-10-CM | POA: Diagnosis not present

## 2023-11-05 DIAGNOSIS — Z860100 Personal history of colon polyps, unspecified: Secondary | ICD-10-CM

## 2023-11-05 DIAGNOSIS — E119 Type 2 diabetes mellitus without complications: Secondary | ICD-10-CM

## 2023-11-05 DIAGNOSIS — Z0001 Encounter for general adult medical examination with abnormal findings: Secondary | ICD-10-CM | POA: Diagnosis not present

## 2023-11-05 DIAGNOSIS — Z1231 Encounter for screening mammogram for malignant neoplasm of breast: Secondary | ICD-10-CM | POA: Diagnosis not present

## 2023-11-05 DIAGNOSIS — Z794 Long term (current) use of insulin: Secondary | ICD-10-CM

## 2023-11-05 DIAGNOSIS — Z780 Asymptomatic menopausal state: Secondary | ICD-10-CM

## 2023-11-05 DIAGNOSIS — Z6828 Body mass index (BMI) 28.0-28.9, adult: Secondary | ICD-10-CM

## 2023-11-05 DIAGNOSIS — J3081 Allergic rhinitis due to animal (cat) (dog) hair and dander: Secondary | ICD-10-CM | POA: Diagnosis not present

## 2023-11-05 MED ORDER — PEG 3350-KCL-NABCB-NACL-NASULF 236 G PO SOLR: 4000.0000 mL | Freq: Once | ORAL | 0 refills | Status: AC

## 2023-11-05 MED ORDER — TRESIBA FLEXTOUCH 100 UNIT/ML ~~LOC~~ SOPN: 30.0000 [IU] | PEN_INJECTOR | Freq: Every day | SUBCUTANEOUS | 0 refills | Status: DC

## 2023-11-05 MED ORDER — MELOXICAM 15 MG PO TABS: 15.0000 mg | ORAL_TABLET | Freq: Every day | ORAL | 0 refills | Status: DC

## 2023-11-05 NOTE — Telephone Encounter (Signed)
 Copied from CRM [id number]. Topic: Clinical - Medication Refill >> Nov 05, 2023  1:36 PM [name] T wrote: Most Recent Primary Care Visit:  Provider: Carollynn Cirri  Department: [facility]  Visit Type: PHYSICAL  Date: 11/05/2023  Medication: insulin  degludec (TRESIBA  FLEXTOUCH) 100 UNIT/ML FlexTouch Pen  Has the patient contacted their pharmacy? No  Is this the correct pharmacy for this prescription? Yes If no, delete pharmacy and type the correct one.  This is the patient's preferred pharmacy:  [id number] - [place], [place] - [address] [name] [address] Phone: [phone] Fax: [phone]  Has the prescription been filled recently? Yes  Is the patient out of the medication? Yes  Has the patient been seen for an appointment in the last year OR does the patient have an upcoming appointment? Yes  Can we respond through MyChart? No  Agent: Please be advised that Rx refills may take up to 3 business days. We ask that you follow-up with your pharmacy.

## 2023-11-05 NOTE — Assessment & Plan Note (Signed)
 Encourage diet and exercise for weight loss
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
## 2023-11-05 NOTE — Patient Instructions (Addendum)
 MRI right hand  - Start meloxicam  15 mg daily x2 weeks.  If still having pain after 2 weeks, complete 3rd-week of NSAID. May use remaining NSAID as needed once daily for pain control.  Do not to use additional over-the-counter NSAIDs (ibuprofen , naproxen , Advil , Aleve , etc.) while taking prescription NSAIDs.  May use Tylenol  [phone] mg 2 to 3 times a day for breakthrough pain. Start meloxicam  may 7th   Follow up may 9th

## 2023-11-05 NOTE — Telephone Encounter (Signed)
 Copied from CRM [id number]. Topic: Clinical - Prescription Issue >> Nov 05, 2023  1:13 PM Lotus Round B wrote: Reason for CRM: pt called in because she went to the pharmacy to pick up her medication insulin  degludec (TRESIBA  FLEXTOUCH) 100 UNIT/ML FlexTouch Pen but the pharmacy told her they dont have any in stock and the patient is completely out . Pt was wondering if there is a way the Dr.Baity can send something else in for the moment . If possible to do can the patient get a call . Phone number [phone]

## 2023-11-05 NOTE — Telephone Encounter (Signed)
 Pt is due for 1 year recall colonoscopy for Hx of colon polyps

## 2023-11-05 NOTE — Patient Instructions (Signed)
 Health Maintenance for Postmenopausal Women Menopause is a normal process in which your ability to get pregnant comes to an end. This process happens slowly over many months or years, usually between the ages of 24 and 62. Menopause is complete when you have missed your menstrual period for 12 months. It is important to talk with your health care provider about some of the most common conditions that affect women after menopause (postmenopausal women). These include heart disease, cancer, and bone loss (osteoporosis). Adopting a healthy lifestyle and getting preventive care can help to promote your health and wellness. The actions you take can also lower your chances of developing some of these common conditions. What are the signs and symptoms of menopause? During menopause, you may have the following symptoms: Hot flashes. These can be moderate or severe. Night sweats. Decrease in sex drive. Mood swings. Headaches. Tiredness (fatigue). Irritability. Memory problems. Problems falling asleep or staying asleep. Talk with your health care provider about treatment options for your symptoms. Do I need hormone replacement therapy? Hormone replacement therapy is effective in treating symptoms that are caused by menopause, such as hot flashes and night sweats. Hormone replacement carries certain risks, especially as you become older. If you are thinking about using estrogen or estrogen with progestin, discuss the benefits and risks with your health care provider. How can I reduce my risk for heart disease and stroke? The risk of heart disease, heart attack, and stroke increases as you age. One of the causes may be a change in the body's hormones during menopause. This can affect how your body uses dietary fats, triglycerides, and cholesterol. Heart attack and stroke are medical emergencies. There are many things that you can do to help prevent heart disease and stroke. Watch your blood pressure High  blood pressure causes heart disease and increases the risk of stroke. This is more likely to develop in people who have high blood pressure readings or are overweight. Have your blood pressure checked: Every 3-5 years if you are 50-75 years of age. Every year if you are 77 years old or older. Eat a healthy diet  Eat a diet that includes plenty of vegetables, fruits, low-fat dairy products, and lean protein. Do not eat a lot of foods that are high in solid fats, added sugars, or sodium. Get regular exercise Get regular exercise. This is one of the most important things you can do for your health. Most adults should: Try to exercise for at least 150 minutes each week. The exercise should increase your heart rate and make you sweat (moderate-intensity exercise). Try to do strengthening exercises at least twice each week. Do these in addition to the moderate-intensity exercise. Spend less time sitting. Even light physical activity can be beneficial. Other tips Work with your health care provider to achieve or maintain a healthy weight. Do not use any products that contain nicotine or tobacco. These products include cigarettes, chewing tobacco, and vaping devices, such as e-cigarettes. If you need help quitting, ask your health care provider. Know your numbers. Ask your health care provider to check your cholesterol and your blood sugar (glucose). Continue to have your blood tested as directed by your health care provider. Do I need screening for cancer? Depending on your health history and family history, you may need to have cancer screenings at different stages of your life. This may include screening for: Breast cancer. Cervical cancer. Lung cancer. Colorectal cancer. What is my risk for osteoporosis? After menopause, you may be  at increased risk for osteoporosis. Osteoporosis is a condition in which bone destruction happens more quickly than new bone creation. To help prevent osteoporosis or  the bone fractures that can happen because of osteoporosis, you may take the following actions: If you are 61-3 years old, get at least 1,000 mg of calcium and at least 600 international units (IU) of vitamin D per day. If you are older than age 61 but younger than age 75, get at least 1,200 mg of calcium and at least 600 international units (IU) of vitamin D per day. If you are older than age 62, get at least 1,200 mg of calcium and at least 800 international units (IU) of vitamin D per day. Smoking and drinking excessive alcohol increase the risk of osteoporosis. Eat foods that are rich in calcium and vitamin D, and do weight-bearing exercises several times each week as directed by your health care provider. How does menopause affect my mental health? Depression may occur at any age, but it is more common as you become older. Common symptoms of depression include: Feeling depressed. Changes in sleep patterns. Changes in appetite or eating patterns. Feeling an overall lack of motivation or enjoyment of activities that you previously enjoyed. Frequent crying spells. Talk with your health care provider if you think that you are experiencing any of these symptoms. General instructions See your health care provider for regular wellness exams and vaccines. This may include: Scheduling regular health, dental, and eye exams. Getting and maintaining your vaccines. These include: Influenza vaccine. Get this vaccine each year before the flu season begins. Pneumonia vaccine. Shingles vaccine. Tetanus, diphtheria, and pertussis (Tdap) booster vaccine. Your health care provider may also recommend other immunizations. Tell your health care provider if you have ever been abused or do not feel safe at home. Summary Menopause is a normal process in which your ability to get pregnant comes to an end. This condition causes hot flashes, night sweats, decreased interest in sex, mood swings, headaches, or lack  of sleep. Treatment for this condition may include hormone replacement therapy. Take actions to keep yourself healthy, including exercising regularly, eating a healthy diet, watching your weight, and checking your blood pressure and blood sugar levels. Get screened for cancer and depression. Make sure that you are up to date with all your vaccines. This information is not intended to replace advice given to you by your health care provider. Make sure you discuss any questions you have with your health care provider. Document Revised: 11/19/2020 Document Reviewed: 11/19/2020 Elsevier Patient Education  2024 [employer].

## 2023-11-05 NOTE — Telephone Encounter (Signed)
 Gastroenterology Pre-Procedure Review  Request Date: 12/03/2023 Requesting Physician: Dr. Ole Berkeley  PATIENT REVIEW QUESTIONS: The patient responded to the following health history questions as indicated:    1. Are you having any GI issues? no 2. Do you have a personal history of Polyps? yes (last colonoscopy was 11/17/2022 with Dr Ole Berkeley) 3. Do you have a family history of Colon Cancer or Polyps? no 4. Diabetes Mellitus? yes (taking glipizide , insulin , and Januvia ) 5. Joint replacements in the past 12 months?no 6. Major health problems in the past 3 months?no 7. Any artificial heart valves, MVP, or defibrillator?no    MEDICATIONS & ALLERGIES:    Patient reports the following regarding taking any anticoagulation/antiplatelet therapy:   Plavix , Coumadin, Eliquis, Xarelto, Lovenox, Pradaxa, Brilinta, or Effient? no Aspirin ? yes (81 mg)  Patient confirms/reports the following medications:  Current Outpatient Medications  Medication Sig Dispense Refill   albuterol  (VENTOLIN  HFA) 108 (90 Base) MCG/ACT inhaler Inhale 2 puffs into the lungs every 4 (four) hours as needed for wheezing or shortness of breath. 8 g 2   aspirin  EC 81 MG EC tablet Take 1 tablet (81 mg total) by mouth daily.     atorvastatin  (LIPITOR) 40 MG tablet Take 40 mg by mouth daily.     azelastine  (ASTELIN ) 0.1 % nasal spray Place 2 sprays into both nostrils 2 (two) times daily. Use in each nostril as directed (Patient taking differently: Place 2 sprays into both nostrils as needed. Use in each nostril as directed) 30 mL 12   B-D UF III MINI PEN NEEDLES 31G X 5 MM MISC USE TO INJECT INSULIN  SIX TIMES DAILY 90 each 23   dolutegravir -lamiVUDine  (DOVATO ) 50-300 MG tablet Take 1 tablet by mouth daily. 30 tablet 11   ezetimibe  (ZETIA ) 10 MG tablet TAKE 1 TABLET DAILY 90 tablet 1   glipiZIDE  (GLUCOTROL ) 10 MG tablet Take 1 tablet (10 mg total) by mouth 2 (two) times daily before a meal. 180 tablet 2   glucose 4 GM chewable tablet Chew 1  tablet (4 g total) by mouth as needed for low blood sugar. 30 tablet 3   glucose blood (FREESTYLE LITE) test strip Use to test blood sugar twice a day DX: E11.9 100 strip 5   insulin  degludec (TRESIBA  FLEXTOUCH) 100 UNIT/ML FlexTouch Pen Inject 30 Units into the skin daily. 15 mL 0   meloxicam  (MOBIC ) 15 MG tablet Take 1 tablet (15 mg total) by mouth daily. 30 tablet 0   meloxicam  (MOBIC ) 15 MG tablet Take 1 tablet (15 mg total) by mouth daily. 30 tablet 0   methocarbamol  (ROBAXIN ) 500 MG tablet TAKE 1 TABLET EVERY 8 HOURS AS NEEDED 30 tablet 0   montelukast  (SINGULAIR ) 10 MG tablet Take 1 tablet (10 mg total) by mouth at bedtime. 90 tablet 3   potassium chloride  SA (KLOR-CON  M20) 20 MEQ tablet TAKE 1 TABLET DAILY 90 tablet 1   rosuvastatin  (CRESTOR ) 10 MG tablet TAKE 1 TABLET DAILY 90 tablet 3   sitaGLIPtin  (JANUVIA ) 100 MG tablet TAKE 1 TABLET DAILY 90 tablet 1   triamcinolone  cream (KENALOG ) 0.1 % Apply 1 Application topically 2 (two) times daily. 30 g 0   No current facility-administered medications for this visit.    Patient confirms/reports the following allergies:  Allergies  Allergen Reactions   Azithromycin  Itching    Muscle aches   Erythromycin Nausea And Vomiting   Penicillins Nausea And Vomiting    Did it involve swelling of the face/tongue/throat, SOB, or low BP? No  Did it involve sudden or severe rash/hives, skin peeling, or any reaction on the inside of your mouth or nose? No Did you need to seek medical attention at a hospital or doctor's office? No When did it last happen?      childhood allergy If all above answers are "NO", may proceed with cephalosporin use.    Tetracycline Nausea And Vomiting    No orders of the defined types were placed in this encounter.   AUTHORIZATION INFORMATION Primary Insurance: 1D#: Group #:  Secondary Insurance: 1D#: Group #:  SCHEDULE INFORMATION: Date: 12/03/2023 Time: Location:  [facility]

## 2023-11-05 NOTE — Telephone Encounter (Signed)
 LM for patient to call back with another pharmacy she'd like for us  to send that medication to. Okay to get this information from patient.

## 2023-11-05 NOTE — Telephone Encounter (Signed)
 Colonoscopy have been schedule 12/03/2023 with Dr Ole Berkeley

## 2023-11-06 ENCOUNTER — Encounter: Payer: Self-pay | Admitting: Internal Medicine

## 2023-11-06 ENCOUNTER — Other Ambulatory Visit: Payer: Self-pay

## 2023-11-06 ENCOUNTER — Other Ambulatory Visit: Payer: Self-pay | Admitting: Internal Medicine

## 2023-11-06 LAB — COMPREHENSIVE METABOLIC PANEL WITH GFR
AG Ratio: 1.5 (calc) (ref 1.0–2.5)
ALT: 17 U/L (ref 6–29)
AST: 17 U/L (ref 10–35)
Albumin: 4.1 g/dL (ref 3.6–5.1)
Alkaline phosphatase (APISO): 100 U/L (ref 37–153)
BUN: 11 mg/dL (ref 7–25)
CO2: 29 mmol/L (ref 20–32)
Calcium: 8.9 mg/dL (ref 8.6–10.4)
Chloride: 104 mmol/L (ref 98–110)
Creat: 1.02 mg/dL (ref 0.50–1.05)
Globulin: 2.7 g/dL (ref 1.9–3.7)
Glucose, Bld: 82 mg/dL (ref 65–99)
Potassium: 3.7 mmol/L (ref 3.5–5.3)
Sodium: 141 mmol/L (ref 135–146)
Total Bilirubin: 0.3 mg/dL (ref 0.2–1.2)
Total Protein: 6.8 g/dL (ref 6.1–8.1)
eGFR: 61 mL/min/{1.73_m2} (ref >=60)

## 2023-11-06 LAB — CBC
HCT: 37.4 % (ref 35.0–45.0)
Hemoglobin: 12.2 g/dL (ref 11.7–15.5)
MCH: 30.2 pg (ref 27.0–33.0)
MCHC: 32.6 g/dL (ref 32.0–36.0)
MCV: 92.6 fL (ref 80.0–100.0)
MPV: 9.9 fL (ref 7.5–12.5)
Platelets: 226 10*3/uL (ref 140–400)
RBC: 4.04 10*6/uL (ref 3.80–5.10)
RDW: 13.1 % (ref 11.0–15.0)
WBC: 5.2 10*3/uL (ref 3.8–10.8)

## 2023-11-06 LAB — MICROALBUMIN / CREATININE URINE RATIO
Creatinine, Urine: 164 mg/dL (ref 20–275)
Microalb Creat Ratio: 5 mg/g{creat} (ref <30)
Microalb, Ur: 0.8 mg/dL

## 2023-11-06 LAB — LIPID PANEL
Cholesterol: 120 mg/dL (ref <200)
HDL: 53 mg/dL (ref >=50)
LDL Cholesterol (Calc): 47 mg/dL
Non-HDL Cholesterol (Calc): 67 mg/dL (ref <130)
Total CHOL/HDL Ratio: 2.3 (calc) (ref <5.0)
Triglycerides: 117 mg/dL (ref <150)

## 2023-11-06 LAB — HEMOGLOBIN A1C
Hgb A1c MFr Bld: 7.4 % — ABNORMAL HIGH (ref <5.7)
Mean Plasma Glucose: 166 mg/dL
eAG (mmol/L): 9.2 mmol/L

## 2023-11-06 MED ORDER — TRESIBA FLEXTOUCH 100 UNIT/ML ~~LOC~~ SOPN: 30.0000 [IU] | PEN_INJECTOR | Freq: Every day | SUBCUTANEOUS | 0 refills | Status: DC

## 2023-11-06 NOTE — Telephone Encounter (Signed)
 Requested medication (s) are due for refill today: Yes  Requested medication (s) are on the active medication list: Yes  Last refill:  11/06/23  Future visit scheduled: No  Notes to clinic:  Alternative Requested:THE PRESCRIBED MEDICATION IS NOT COVERED BY INSURANCE. PLEASE CONSIDER CHANGING TO ONE OF THE SUGGESTED COVERED ALTERNATIVES.      Requested Prescriptions  Pending Prescriptions Disp Refills   TRESIBA  FLEXTOUCH 100 UNIT/ML FlexTouch Pen [Pharmacy Med Name: TRESIBA  FLEXTOUCH 100 UNIT/ML]  0     Endocrinology:  Diabetes - Insulins Failed - 11/06/2023  4:14 PM      Failed - Valid encounter within last 6 months    Recent Outpatient Visits           Yesterday Encounter for general adult medical examination with abnormal findings   [place], Rankin Buzzard, NP       Future Appointments             In 2 weeks Ulysees Gander, DO [facility] - HBA1C is between 0 and 7.9 and within 180 days    Hemoglobin A1C  Date Value Ref Range Status  06/22/2014 7.5 (H) 4.2 - 6.3 % Final    Comment:    The American Diabetes Association recommends that a primary goal of therapy should be <7% and that physicians should reevaluate the treatment regimen in patients with HbA1c values consistently >8%.    HbA1c, POC (controlled diabetic range)  Date Value Ref Range Status  01/26/2023 7.0 0.0 - 7.0 % Final   Hgb A1c MFr Bld  Date Value Ref Range Status  11/05/2023 7.4 (H) <5.7 % Final    Comment:    For someone without known diabetes, a hemoglobin A1c value of 6.5% or greater indicates that they may have  diabetes and this should be confirmed with a follow-up  test. . For someone with known diabetes, a value <7% indicates  that their diabetes is well controlled and a value  greater than or equal to 7% indicates suboptimal  control. A1c targets should be individualized based on  duration  of diabetes, age, comorbid conditions, and  other considerations. . Currently, no consensus exists regarding use of hemoglobin A1c for diagnosis of diabetes for children. Aaron Aas

## 2023-11-06 NOTE — Telephone Encounter (Signed)
 Duplicate request. Rx reordered 11/06/23 Requested Prescriptions  Pending Prescriptions Disp Refills   insulin  degludec (TRESIBA  FLEXTOUCH) 100 UNIT/ML FlexTouch Pen 15 mL 0    Sig: Inject 30 Units into the skin daily.     Endocrinology:  Diabetes - Insulins Failed - 11/06/2023 11:31 AM      Failed - Valid encounter within last 6 months    Recent Outpatient Visits           Yesterday Encounter for general adult medical examination with abnormal findings   [facility], Rankin Buzzard, NP       Future Appointments             In 2 weeks Ulysees Gander, DO [facility] at [facility] - HBA1C is between 0 and 7.9 and within 180 days    Hemoglobin A1C  Date Value Ref Range Status  06/22/2014 7.5 (H) 4.2 - 6.3 % Final    Comment:    The American Diabetes Association recommends that a primary goal of therapy should be <7% and that physicians should reevaluate the treatment regimen in patients with HbA1c values consistently >8%.    HbA1c, POC (controlled diabetic range)  Date Value Ref Range Status  01/26/2023 7.0 0.0 - 7.0 % Final   Hgb A1c MFr Bld  Date Value Ref Range Status  11/05/2023 7.4 (H) <5.7 % Final    Comment:    For someone without known diabetes, a hemoglobin A1c value of 6.5% or greater indicates that they may have  diabetes and this should be confirmed with a follow-up  test. . For someone with known diabetes, a value <7% indicates  that their diabetes is well controlled and a value  greater than or equal to 7% indicates suboptimal  control. A1c targets should be individualized based on  duration of diabetes, age, comorbid conditions, and  other considerations. . Currently, no consensus exists regarding use of hemoglobin A1c for diagnosis of diabetes for children. Aaron Aas

## 2023-11-13 ENCOUNTER — Other Ambulatory Visit: Payer: Self-pay | Admitting: Internal Medicine

## 2023-11-14 ENCOUNTER — Ambulatory Visit

## 2023-11-14 DIAGNOSIS — M19011 Primary osteoarthritis, right shoulder: Secondary | ICD-10-CM | POA: Diagnosis not present

## 2023-11-14 DIAGNOSIS — M79641 Pain in right hand: Secondary | ICD-10-CM | POA: Diagnosis not present

## 2023-11-14 DIAGNOSIS — S63501D Unspecified sprain of right wrist, subsequent encounter: Secondary | ICD-10-CM

## 2023-11-16 ENCOUNTER — Other Ambulatory Visit: Payer: Self-pay

## 2023-11-16 MED ORDER — POTASSIUM CHLORIDE CRYS ER 20 MEQ PO TBCR
20.0000 meq | EXTENDED_RELEASE_TABLET | Freq: Every day | ORAL | 1 refills | Status: DC
Start: 2023-11-16 — End: 2023-11-16

## 2023-11-16 MED ORDER — POTASSIUM CHLORIDE CRYS ER 20 MEQ PO TBCR: 20.0000 meq | EXTENDED_RELEASE_TABLET | Freq: Every day | ORAL | 1 refills | Status: DC

## 2023-11-16 NOTE — Telephone Encounter (Signed)
 Refused potassium refill as this is from [facility] and this rx was sent to [facility] #[id number].

## 2023-11-19 NOTE — Progress Notes (Addendum)
 [name] D.[name] [address] [place] [address] Phone: [phone]   Assessment and Plan:     1. Right hand pain 2. Right wrist sprain, subsequent encounter -Chronic with exacerbation, subsequent visit - Continued, significant hand pain along radial sided right wrist.  Most consistent with strain of extensor pollicis brevis tendon as seen on MRI.  Reassuring there is no fracture or carpal ligamentous damage - May continue complete course of meloxicam  - Patient elected for CSI to first dorsal wrist compartment.  Tolerated well per note below - May continue to use wrist brace as needed for support  Procedure: Ultrasound Guided extensor tendon Sheath Injection Side: Right Diagnosis: Right extensor carpi brevis strength US  Indication:  - accuracy is paramount for diagnosis - to ensure therapeutic efficacy or procedural success - to reduce procedural risk   After explaining the procedure, viable alternatives, risks, and answering any questions, consent was given verbally. The site was cleaned with chlorhexidine  prep. An ultrasound transducer was placed on the dorsal wrist.  The 1st dorsal wrist compartment was identified.  There was fluid surrounding the tendons and within the sheath. The tendons were Normal.  A steroid injection was performed under ultrasound guidance using  1ml of 1% lidocaine  without epinephrine  and 40 mg of Kenalog  40. This was well tolerated and resulted in  relief.  Needle was removed and dressing placed and post injection instructions were given including  a discussion of likely return of pain today after the anesthetic wears off (with the possibility of worsened pain) until the steroid starts to work in 1-3 days.   Pt was advised to call or return to clinic if these symptoms worsen or fail to improve as anticipated.   Pertinent previous records reviewed include right hand MRI 11/14/2023  Follow Up: 3 weeks for  reevaluation.  If no improvement or worsening symptoms, could consider prolonged brace use versus physical therapy   Subjective:   I, [name], am serving as a Neurosurgeon for Doctor Ulysees Gander   Chief Complaint:  R wrist pain    HPI:    08/27/23 Patient is a 68 year old female with wrist pain. Patient states right wrist pain for about a month or so. Naproxen  does not help. She stopped her self from fall and caught herself on a wall. Decreased ROM due to pain. When flared the pain radiates up the arm. Decrease in grip strength. Pain with ADL. No numnbess or tingling. She has constant pain. She does type for work    11/05/2023 Patient states wrist is still in pain since she ran out of meloxicam    11/20/2023 Patient states she feels fine   Relevant Historical Information: DM type II, hypertension,   CKD, history of CVA.  Ulcerative colitis documented in chart, however patient takes no medications and is not followed by GI    Additional pertinent review of systems negative.   Current Outpatient Medications:    albuterol  (VENTOLIN  HFA) 108 (90 Base) MCG/ACT inhaler, Inhale 2 puffs into the lungs every 4 (four) hours as needed for wheezing or shortness of breath., Disp: 8 g, Rfl: 2   aspirin  EC 81 MG EC tablet, Take 1 tablet (81 mg total) by mouth daily., Disp: , Rfl:    atorvastatin  (LIPITOR) 40 MG tablet, Take 40 mg by mouth daily., Disp: , Rfl:    azelastine  (ASTELIN ) 0.1 % nasal spray, Place 2 sprays into both nostrils 2 (two) times daily. Use in  each nostril as directed (Patient taking differently: Place 2 sprays into both nostrils as needed. Use in each nostril as directed), Disp: 30 mL, Rfl: 12   B-D UF III MINI PEN NEEDLES 31G X 5 MM MISC, USE TO INJECT INSULIN  SIX TIMES DAILY, Disp: 90 each, Rfl: 23   dolutegravir -lamiVUDine  (DOVATO ) 50-300 MG tablet, Take 1 tablet by mouth daily., Disp: 30 tablet, Rfl: 11   ezetimibe  (ZETIA ) 10 MG tablet, TAKE 1 TABLET DAILY, Disp: 90 tablet,  Rfl: 1   glipiZIDE  (GLUCOTROL ) 10 MG tablet, Take 1 tablet (10 mg total) by mouth 2 (two) times daily before a meal., Disp: 180 tablet, Rfl: 2   glucose 4 GM chewable tablet, Chew 1 tablet (4 g total) by mouth as needed for low blood sugar., Disp: 30 tablet, Rfl: 3   glucose blood (FREESTYLE LITE) test strip, Use to test blood sugar twice a day DX: E11.9, Disp: 100 strip, Rfl: 5   insulin  degludec (TRESIBA  FLEXTOUCH) 100 UNIT/ML FlexTouch Pen, Inject 30 Units into the skin daily., Disp: 15 mL, Rfl: 0   meloxicam  (MOBIC ) 15 MG tablet, Take 1 tablet (15 mg total) by mouth daily., Disp: 30 tablet, Rfl: 0   meloxicam  (MOBIC ) 15 MG tablet, Take 1 tablet (15 mg total) by mouth daily., Disp: 30 tablet, Rfl: 0   methocarbamol  (ROBAXIN ) 500 MG tablet, TAKE 1 TABLET EVERY 8 HOURS AS NEEDED, Disp: 30 tablet, Rfl: 0   montelukast  (SINGULAIR ) 10 MG tablet, Take 1 tablet (10 mg total) by mouth at bedtime., Disp: 90 tablet, Rfl: 3   potassium chloride  SA (KLOR-CON  M20) 20 MEQ tablet, Take 1 tablet (20 mEq total) by mouth daily., Disp: 90 tablet, Rfl: 1   rosuvastatin  (CRESTOR ) 10 MG tablet, TAKE 1 TABLET DAILY, Disp: 90 tablet, Rfl: 3   sitaGLIPtin  (JANUVIA ) 100 MG tablet, TAKE 1 TABLET DAILY, Disp: 90 tablet, Rfl: 1   triamcinolone  cream (KENALOG ) 0.1 %, Apply 1 Application topically 2 (two) times daily., Disp: 30 g, Rfl: 0   Objective:     Vitals:   11/20/23 0908  BP: 132/78  Weight: 161 lb (73 kg)  Height: 5\' 2"  (1.575 m)      Body mass index is 29.45 kg/m.    Physical Exam:    General: Appears well, nad, nontoxic and pleasant Neuro:sensation intact, strength is 5/5 in upper extremities, muscle tone wnl Skin:no susupicious lesions or rashes   Right hand/Wrist:   No deformity or swelling appreciated. ROM  Ext 70, flexion 60, radial/ulnar deviation 15 TTP snuffbox, dorsal carpals, volar carpals, first MCP, CMC nttp over the   radial styloid, ulnar styloid,  , tfcc Negative Tinel's, Phalen's,  Prayer Tests Positive finklestein Neg tfcc bounce test   pain with resisted ext, flex or deviation     Electronically signed by:  Marshall Skeeter D.[name] 9:22 AM 11/20/23

## 2023-11-20 ENCOUNTER — Other Ambulatory Visit: Payer: Self-pay

## 2023-11-20 ENCOUNTER — Ambulatory Visit (INDEPENDENT_AMBULATORY_CARE_PROVIDER_SITE_OTHER): Admitting: Sports Medicine

## 2023-11-20 VITALS — BP 132/78 | Ht 62.0 in | Wt 161.0 lb

## 2023-11-20 DIAGNOSIS — M79641 Pain in right hand: Secondary | ICD-10-CM | POA: Diagnosis not present

## 2023-11-20 DIAGNOSIS — J301 Allergic rhinitis due to pollen: Secondary | ICD-10-CM | POA: Diagnosis not present

## 2023-11-20 DIAGNOSIS — J3089 Other allergic rhinitis: Secondary | ICD-10-CM | POA: Diagnosis not present

## 2023-11-20 DIAGNOSIS — J3081 Allergic rhinitis due to animal (cat) (dog) hair and dander: Secondary | ICD-10-CM | POA: Diagnosis not present

## 2023-11-20 DIAGNOSIS — S63501D Unspecified sprain of right wrist, subsequent encounter: Secondary | ICD-10-CM

## 2023-11-23 ENCOUNTER — Other Ambulatory Visit: Payer: Self-pay

## 2023-11-27 ENCOUNTER — Telehealth: Payer: Self-pay

## 2023-11-27 ENCOUNTER — Other Ambulatory Visit: Payer: Self-pay

## 2023-11-27 NOTE — Telephone Encounter (Signed)
 Endo contacted office to request a call back to patient to go over colonoscopy instructions and confirm procedure date.  Procedure date on 1st page of instructions noted 01/03/24 and all other procedure dates were noted correctly.  This confused patient, and Julie and Laveta Pottier requested patient to be contacted.  I attempted to call patient back, however she did not answer.  I copied and pasted the instructions from her chart correcting the first "procedure date" line to 12/03/23 and sent it via mychart patient message.  Thanks, [place], [place]

## 2023-12-03 ENCOUNTER — Encounter: Payer: Self-pay | Admitting: Gastroenterology

## 2023-12-03 ENCOUNTER — Encounter
Admission: RE | Disposition: A | Discharge status: Home / Self Care | Payer: Self-pay | Source: Home / Self Care | Attending: Gastroenterology

## 2023-12-03 ENCOUNTER — Other Ambulatory Visit: Payer: Self-pay | Admitting: Infectious Diseases

## 2023-12-03 ENCOUNTER — Ambulatory Visit: Admitting: General Practice

## 2023-12-03 ENCOUNTER — Other Ambulatory Visit: Payer: Self-pay

## 2023-12-03 ENCOUNTER — Other Ambulatory Visit (HOSPITAL_COMMUNITY): Payer: Self-pay

## 2023-12-03 ENCOUNTER — Ambulatory Visit
Admission: RE | Admit: 2023-12-03 | Discharge: 2023-12-03 | Disposition: A | Discharge status: Home / Self Care | Attending: Gastroenterology | Admitting: Gastroenterology

## 2023-12-03 DIAGNOSIS — Z794 Long term (current) use of insulin: Secondary | ICD-10-CM | POA: Insufficient documentation

## 2023-12-03 DIAGNOSIS — Z5986 Financial insecurity: Secondary | ICD-10-CM | POA: Insufficient documentation

## 2023-12-03 DIAGNOSIS — Z1211 Encounter for screening for malignant neoplasm of colon: Secondary | ICD-10-CM | POA: Insufficient documentation

## 2023-12-03 DIAGNOSIS — E119 Type 2 diabetes mellitus without complications: Secondary | ICD-10-CM | POA: Insufficient documentation

## 2023-12-03 DIAGNOSIS — Z8249 Family history of ischemic heart disease and other diseases of the circulatory system: Secondary | ICD-10-CM | POA: Diagnosis not present

## 2023-12-03 DIAGNOSIS — Z79899 Other long term (current) drug therapy: Secondary | ICD-10-CM | POA: Insufficient documentation

## 2023-12-03 DIAGNOSIS — Z21 Asymptomatic human immunodeficiency virus [HIV] infection status: Secondary | ICD-10-CM

## 2023-12-03 DIAGNOSIS — I129 Hypertensive chronic kidney disease with stage 1 through stage 4 chronic kidney disease, or unspecified chronic kidney disease: Secondary | ICD-10-CM | POA: Diagnosis not present

## 2023-12-03 DIAGNOSIS — K5730 Diverticulosis of large intestine without perforation or abscess without bleeding: Secondary | ICD-10-CM | POA: Diagnosis not present

## 2023-12-03 DIAGNOSIS — Z860100 Personal history of colon polyps, unspecified: Secondary | ICD-10-CM | POA: Diagnosis not present

## 2023-12-03 DIAGNOSIS — Z8711 Personal history of peptic ulcer disease: Secondary | ICD-10-CM | POA: Insufficient documentation

## 2023-12-03 DIAGNOSIS — Z833 Family history of diabetes mellitus: Secondary | ICD-10-CM | POA: Diagnosis not present

## 2023-12-03 DIAGNOSIS — J45909 Unspecified asthma, uncomplicated: Secondary | ICD-10-CM | POA: Diagnosis not present

## 2023-12-03 DIAGNOSIS — Z7984 Long term (current) use of oral hypoglycemic drugs: Secondary | ICD-10-CM | POA: Insufficient documentation

## 2023-12-03 DIAGNOSIS — Z8673 Personal history of transient ischemic attack (TIA), and cerebral infarction without residual deficits: Secondary | ICD-10-CM | POA: Insufficient documentation

## 2023-12-03 DIAGNOSIS — I10 Essential (primary) hypertension: Secondary | ICD-10-CM | POA: Insufficient documentation

## 2023-12-03 DIAGNOSIS — N183 Chronic kidney disease, stage 3 unspecified: Secondary | ICD-10-CM | POA: Diagnosis not present

## 2023-12-03 DIAGNOSIS — Z860101 Personal history of adenomatous and serrated colon polyps: Secondary | ICD-10-CM | POA: Diagnosis not present

## 2023-12-03 SURGERY — COLONOSCOPY
Anesthesia: General

## 2023-12-03 MED ORDER — PHENYLEPHRINE 80 MCG/ML (10ML) SYRINGE FOR IV PUSH (FOR BLOOD PRESSURE SUPPORT)
PREFILLED_SYRINGE | INTRAVENOUS | Status: DC | PRN
  Administered 2023-12-03: 80 ug via INTRAVENOUS

## 2023-12-03 MED ORDER — SODIUM CHLORIDE 0.9 % IV SOLN: INTRAVENOUS | Status: DC

## 2023-12-03 MED ORDER — LIDOCAINE HCL (CARDIAC) PF 100 MG/5ML IV SOSY
PREFILLED_SYRINGE | INTRAVENOUS | Status: DC | PRN
  Administered 2023-12-03: 50 mg via INTRAVENOUS

## 2023-12-03 MED ORDER — PROPOFOL 10 MG/ML IV BOLUS
INTRAVENOUS | Status: DC | PRN
  Administered 2023-12-03: 40 mg via INTRAVENOUS
  Administered 2023-12-03: 50 mg via INTRAVENOUS
  Administered 2023-12-03: 120 mg via INTRAVENOUS
  Administered 2023-12-03 (×2): 40 mg via INTRAVENOUS

## 2023-12-03 NOTE — Op Note (Addendum)
 [facility] Gastroenterology Patient Name: [name] Procedure Date: 12/03/2023 10:31 AM MRN: [id number] Account #: [id number] Date of Birth: 03/10/1956 Admit Type: Outpatient Age: 68 Room: [facility] ENDO ROOM 4 Gender: Female Note Status: Finalized Instrument Name: Charlyn Cooley [id number] Procedure:             Colonoscopy Indications:           High risk colon cancer surveillance: Personal history                         of colonic polyps Providers:             Marnee Sink MD, MD Referring MD:          Carollynn Cirri (Referring MD) Medicines:             Propofol  per Anesthesia Complications:         No immediate complications. Procedure:             Pre-Anesthesia Assessment:                        - Prior to the procedure, a History and Physical was                         performed, and patient medications and allergies were                         reviewed. The patient's tolerance of previous                         anesthesia was also reviewed. The risks and benefits                         of the procedure and the sedation options and risks                         were discussed with the patient. All questions were                         answered, and informed consent was obtained. Prior                         Anticoagulants: The patient has taken no anticoagulant                         or antiplatelet agents. ASA Grade Assessment: II - A                         patient with mild systemic disease. After reviewing                         the risks and benefits, the patient was deemed in                         satisfactory condition to undergo the procedure.                        After obtaining informed consent, the colonoscope was  passed under direct vision. Throughout the procedure,                         the patient's blood pressure, pulse, and oxygen                         saturations were monitored continuously. The                          Colonoscope was introduced through the anus and                         advanced to the the cecum, identified by appendiceal                         orifice and ileocecal valve. The colonoscopy was                         performed without difficulty. The patient tolerated                         the procedure well. The quality of the bowel                         preparation was adequate to identify polyps. Findings:      The perianal and digital rectal examinations were normal.      Many small-mouthed diverticula were found in the sigmoid colon. Impression:            - Diverticulosis in the sigmoid colon.                        - No specimens collected. Recommendation:        - Discharge patient to home.                        - Resume previous diet.                        - Continue present medications.                        - Repeat colonoscopy in 5 years for surveillance. Procedure Code(s):     --- Professional ---                        [phone], Colonoscopy, flexible; diagnostic, including                         collection of specimen(s) by brushing or washing, when                         performed (separate procedure) Diagnosis Code(s):     --- Professional ---                        Z86.010, Personal history of colonic polyps CPT copyright 2022 American Medical Association. All rights reserved. The codes documented in this report are preliminary and upon coder review may  be revised to meet current compliance requirements. Marnee Sink MD, MD 12/03/2023 10:54:05 AM This report has been  signed electronically. Number of Addenda: 0 Note Initiated On: 12/03/2023 10:31 AM Scope Withdrawal Time: 0 hours 7 minutes 22 seconds  Total Procedure Duration: 0 hours 13 minutes 28 seconds  Estimated Blood Loss:  Estimated blood loss: none.      [name]

## 2023-12-03 NOTE — Transfer of Care (Signed)
 Immediate Anesthesia Transfer of Care Note  Patient: [name]  Procedure(s) Performed: COLONOSCOPY  Patient Location: Endoscopy Unit  Anesthesia Type:General  Level of Consciousness: drowsy  Airway & Oxygen Therapy: Patient Spontanous Breathing  Post-op Assessment: Report given to RN, Post -op Vital signs reviewed and stable, and Patient moving all extremities  Post vital signs: Reviewed and stable  Last Vitals:  Vitals Value Taken Time  BP 102/61 12/03/23 1057  Temp 35.6 C 12/03/23 1057  Pulse 73 12/03/23 1057  Resp 16 12/03/23 1057  SpO2 96 % 12/03/23 1057  Vitals shown include unfiled device data.  Last Pain:  Vitals:   12/03/23 1057  TempSrc: Temporal  PainSc: Asleep         Complications: No notable events documented.
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
## 2023-12-03 NOTE — Anesthesia Postprocedure Evaluation (Signed)
 Anesthesia Post Note  Patient: [name]  Procedure(s) Performed: COLONOSCOPY  Patient location during evaluation: Endoscopy Anesthesia Type: General Level of consciousness: awake and alert Pain management: pain level controlled Vital Signs Assessment: post-procedure vital signs reviewed and stable Respiratory status: spontaneous breathing, nonlabored ventilation, respiratory function stable and patient connected to nasal cannula oxygen Cardiovascular status: blood pressure returned to baseline and stable Postop Assessment: no apparent nausea or vomiting Anesthetic complications: no  No notable events documented.   Last Vitals:  Vitals:   12/03/23 1110 12/03/23 1111  BP:    Pulse:    Resp: 18 17  Temp:    SpO2:      Last Pain:  Vitals:   12/03/23 1106  TempSrc:   PainSc: 0-No pain                 Enrique Harvest

## 2023-12-03 NOTE — Anesthesia Preprocedure Evaluation (Signed)
 Anesthesia Evaluation  Patient identified by MRN, date of birth, ID band Patient awake    Reviewed: Allergy & Precautions, H&P , NPO status , Patient's Chart, lab work & pertinent test results  Airway Mallampati: II  TM Distance: >3 FB Neck ROM: Full    Dental no notable dental hx. 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 CVA negative neurological ROS  negative psych ROS   GI/Hepatic negative GI ROS, Neg liver ROS, PUD,,,Ulcerative colitis   Endo/Other  negative endocrine ROSdiabetes, Poorly Controlled, Type 2, Insulin  Dependent  Hypoglycemic this am, received D50W, and to recheck accucheck; patient states hx hypoglycemia  Renal/GU Renal diseaseCKD stage III  negative genitourinary   Musculoskeletal   Abdominal   Peds  Hematology negative hematology ROS (+) HIV  Anesthesia Other Findings Past Medical History: No date: Arthritis     Comment:  fingers No date: Asthma     Comment:  no trouble lately 11/29/2012: Diabetes mellitus without complication (HCC)     Comment:  hx. NIDDM-dx. 3 weeks ago No date: Headache     Comment:  migraines - none since stroke No date: HIV (human immunodeficiency virus infection) (HCC) No date: Hyperlipidemia     Comment:  hx No date: Hypertension No date: Kidney disease     Comment:  staged three No date: Seasonal allergies     Comment:  has cough 02/18/2018: Stroke (HCC)     Comment:  Residual Left side weakness and speech issues No date: Ulcerative colitis (HCC) No date: Vertigo     Comment:  benign No date: Wears contact lenses No date:  Wears partial dentures     Comment:  upper/ has lower but doesn't wear  Past Surgical History: No date: BUNIONECTOMY     Comment:  bilateral and toe nails of big toes removed 1997/2006: CESAREAN SECTION 12/01/2012: CHOLECYSTECTOMY; N/A     Comment:  Procedure: LAPAROSCOPIC CHOLECYSTECTOMY;  Surgeon: Lockie Rima, MD;  Location: [facility];  Service: General;                Laterality: N/A; No date: COLONOSCOPY W/ POLYPECTOMY 11/17/2022: COLONOSCOPY WITH PROPOFOL ; N/A     Comment:  Procedure: COLONOSCOPY WITH BIOPSY;  Surgeon: Marnee Sink, MD;  Location: [facility];  Service:               Endoscopy;  Laterality: N/A; 2013: EYE SURGERY; Bilateral No date: MULTIPLE TOOTH EXTRACTIONS 11/17/2022: POLYPECTOMY; N/A     Comment:  Procedure: POLYPECTOMY;  Surgeon: Marnee Sink, MD;                Location: [facility];  Service: Endoscopy;                Laterality: N/A;  CLIP X 1 PLACED AT CECAL POLYP REMOVAL               SITE 09/09/2018: RADIOLOGY WITH ANESTHESIA; Left     Comment:  Procedure: MRI WITH ANESTHESIA LEFT SHOULDER WITH  CONTRAST;  Surgeon: Radiologist, Medication, MD;                Location: [facility];  Service: Radiology;  Laterality: Left; 11/30/2018: SHOULDER ARTHROSCOPY WITH ROTATOR CUFF REPAIR AND  SUBACROMIAL DECOMPRESSION; Left     Comment:  Procedure: SHOULDER ARTHROSCOPY WITH SUBACROMIAL               DECOMPRESSION AND DISTAL CLAVICAL EXCISION;  Surgeon:               Rande Bushy, MD;  Location: [facility];                Service: Orthopedics;  Laterality: Left;  Diabetic - oral              meds No date: TONSILLECTOMY No date: TUBAL LIGATION  BMI    Body Mass Index: 28.90 kg/m      Reproductive/Obstetrics negative OB ROS                             Anesthesia Physical Anesthesia Plan  ASA: 3  Anesthesia Plan: General   Post-op Pain Management: Minimal or no pain  anticipated   Induction: Intravenous  PONV Risk Score and Plan: 3 and Propofol  infusion, TIVA and Ondansetron   Airway Management Planned: Nasal Cannula  Additional Equipment: None  Intra-op Plan:   Post-operative Plan:   Informed Consent: I have reviewed the patients History and Physical, chart, labs and discussed the procedure including the risks, benefits and alternatives for the proposed anesthesia with the patient or authorized representative who has indicated his/her understanding and acceptance.     Dental advisory given  Plan Discussed with: CRNA and Surgeon  Anesthesia Plan Comments: (Discussed risks of anesthesia with patient, including possibility of difficulty with spontaneous ventilation under anesthesia necessitating airway intervention, PONV, and rare risks such as cardiac or respiratory or neurological events, and allergic reactions. Discussed the role of CRNA in patient's perioperative care. Patient understands.)       Anesthesia Quick Evaluation

## 2023-12-04 ENCOUNTER — Other Ambulatory Visit: Payer: Self-pay

## 2023-12-04 ENCOUNTER — Encounter: Payer: Self-pay | Admitting: Gastroenterology

## 2023-12-04 ENCOUNTER — Other Ambulatory Visit (HOSPITAL_COMMUNITY): Payer: Self-pay

## 2023-12-04 MED ORDER — DOVATO 50-300 MG PO TABS
1.0000 | ORAL_TABLET | Freq: Every day | ORAL | 0 refills | Status: DC
Start: 2023-12-04 — End: 2024-01-01
  Filled 2023-12-04: qty 30, 30d supply, fill #0

## 2023-12-04 NOTE — Progress Notes (Signed)
 Specialty Pharmacy Refill Coordination Note  [name] is a 68 y.o. female contacted today regarding refills of specialty medication(s) Dolutegravir -lamiVUDine  (Dovato )   Patient requested Delivery   Delivery date: 12/08/23   Verified address: [address] [address]   Medication will be filled on 05.23.25.

## 2023-12-07 ENCOUNTER — Other Ambulatory Visit: Payer: Self-pay | Admitting: Internal Medicine

## 2023-12-09 NOTE — Telephone Encounter (Signed)
 Requested Prescriptions  Pending Prescriptions Disp Refills   rosuvastatin  (CRESTOR ) 10 MG tablet [Pharmacy Med Name: ROSUVASTATIN  TABS 10MG ] 90 tablet 0    Sig: TAKE 1 TABLET DAILY     Cardiovascular:  Antilipid - Statins 2 Failed - 12/09/2023  4:16 PM      Failed - Valid encounter within last 12 months    Recent Outpatient Visits           1 month ago Encounter for general adult medical examination with abnormal findings   [facility] [place], [place], NP       Future Appointments             In 2 days Ulysees Gander, DO [place]  [facility]            Failed - Lipid Panel in normal range within the last 12 months    Cholesterol  Date Value Ref Range Status  11/05/2023 120 <200 mg/dL Final  16/04/9603 540 (H) 0 - 200 mg/dL Final   Ldl Cholesterol, Calc  Date Value Ref Range Status  06/22/2014 SEE COMMENT 0 - 100 mg/dL Final    Comment:    LDL/VLDL - Unable to report VLDL and LDL due to a  - Triglyceride value that is 400 mg/dL or   - greater.    LDL Cholesterol (Calc)  Date Value Ref Range Status  11/05/2023 47 mg/dL (calc) Final    Comment:    Reference range: <100 . Desirable range <100 mg/dL for primary prevention;   <70 mg/dL for patients with CHD or diabetic patients  with > or = 2 CHD risk factors. Aaron Aas LDL-C is now calculated using the Martin-Hopkins  calculation, which is a validated novel method providing  better accuracy than the Friedewald equation in the  estimation of LDL-C.  Melinda Sprawls et al. Erroll Heard. 9811;914(78): 2061-2068  (http://education.[facility].com/faq/FAQ164)    Direct LDL  Date Value Ref Range Status  11/07/2014 76.0 mg/dL Final    Comment:    Optimal:  <100 mg/dLNear or Above Optimal:  100-129 mg/dLBorderline High:  130-159 mg/dLHigh:  160-189 mg/dLVery High:  >190 mg/dL   HDL Cholesterol  Date Value Ref Range Status  06/22/2014 42 40 - 60 mg/dL Final   HDL   Date Value Ref Range Status  11/05/2023 53 > OR = 50 mg/dL Final   Triglycerides  Date Value Ref Range Status  11/05/2023 117 <150 mg/dL Final  29/56/2130 865 (H) 0 - 200 mg/dL Final         Passed - Cr in normal range and within 360 days    Creat  Date Value Ref Range Status  11/05/2023 1.02 0.50 - 1.05 mg/dL Final   Creatinine,U  Date Value Ref Range Status  09/25/2020 145.4 mg/dL Final   Creatinine, Urine  Date Value Ref Range Status  11/05/2023 164 20 - 275 mg/dL Final         Passed - Patient is not pregnant

## 2023-12-10 NOTE — Progress Notes (Unsigned)
 Ben Jackson D.[name] [address] [place] [address] Phone: [phone]   Assessment and Plan:     There are no diagnoses linked to this encounter.  ***   Pertinent previous records reviewed include ***    Follow Up: ***     Subjective:   I, [name], am serving as a Neurosurgeon for Doctor Ulysees Gander   Chief Complaint:  R wrist pain    HPI:    08/27/23 Patient is a 68 year old female with wrist pain. Patient states right wrist pain for about a month or so. Naproxen  does not help. She stopped her self from fall and caught herself on a wall. Decreased ROM due to pain. When flared the pain radiates up the arm. Decrease in grip strength. Pain with ADL. No numnbess or tingling. She has constant pain. She does type for work    11/05/2023 Patient states wrist is still in pain since she ran out of meloxicam     11/20/2023 Patient states she feels fine   12/11/2023 Patient states   Relevant Historical Information: DM type II, hypertension,   CKD, history of CVA.  Ulcerative colitis documented in chart, however patient takes no medications and is not followed by GI    Additional pertinent review of systems negative.   Current Outpatient Medications:    albuterol  (VENTOLIN  HFA) 108 (90 Base) MCG/ACT inhaler, Inhale 2 puffs into the lungs every 4 (four) hours as needed for wheezing or shortness of breath., Disp: 8 g, Rfl: 2   aspirin  EC 81 MG EC tablet, Take 1 tablet (81 mg total) by mouth daily., Disp: , Rfl:    atorvastatin  (LIPITOR) 40 MG tablet, Take 40 mg by mouth daily., Disp: , Rfl:    azelastine  (ASTELIN ) 0.1 % nasal spray, Place 2 sprays into both nostrils 2 (two) times daily. Use in each nostril as directed (Patient taking differently: Place 2 sprays into both nostrils as needed. Use in each nostril as directed), Disp: 30 mL, Rfl: 12   B-D UF III MINI PEN NEEDLES 31G X 5 MM MISC, USE TO INJECT INSULIN  SIX TIMES DAILY,  Disp: 90 each, Rfl: 23   dolutegravir -lamiVUDine  (DOVATO ) 50-300 MG tablet, Take 1 tablet by mouth daily., Disp: 30 tablet, Rfl: 0   ezetimibe  (ZETIA ) 10 MG tablet, TAKE 1 TABLET DAILY, Disp: 90 tablet, Rfl: 1   glipiZIDE  (GLUCOTROL ) 10 MG tablet, Take 1 tablet (10 mg total) by mouth 2 (two) times daily before a meal., Disp: 180 tablet, Rfl: 2   glucose 4 GM chewable tablet, Chew 1 tablet (4 g total) by mouth as needed for low blood sugar., Disp: 30 tablet, Rfl: 3   glucose blood (FREESTYLE LITE) test strip, Use to test blood sugar twice a day DX: E11.9, Disp: 100 strip, Rfl: 5   insulin  degludec (TRESIBA  FLEXTOUCH) 100 UNIT/ML FlexTouch Pen, Inject 30 Units into the skin daily., Disp: 15 mL, Rfl: 0   meloxicam  (MOBIC ) 15 MG tablet, Take 1 tablet (15 mg total) by mouth daily., Disp: 30 tablet, Rfl: 0   meloxicam  (MOBIC ) 15 MG tablet, Take 1 tablet (15 mg total) by mouth daily., Disp: 30 tablet, Rfl: 0   methocarbamol  (ROBAXIN ) 500 MG tablet, TAKE 1 TABLET EVERY 8 HOURS AS NEEDED, Disp: 30 tablet, Rfl: 0   montelukast  (SINGULAIR ) 10 MG tablet, Take 1 tablet (10 mg total) by mouth at bedtime. (Patient not taking: Reported on 12/03/2023), Disp: 90 tablet, Rfl: 3  potassium chloride  SA (KLOR-CON  M20) 20 MEQ tablet, Take 1 tablet (20 mEq total) by mouth daily., Disp: 90 tablet, Rfl: 1   rosuvastatin  (CRESTOR ) 10 MG tablet, TAKE 1 TABLET DAILY, Disp: 90 tablet, Rfl: 0   sitaGLIPtin  (JANUVIA ) 100 MG tablet, TAKE 1 TABLET DAILY, Disp: 90 tablet, Rfl: 1   triamcinolone  cream (KENALOG ) 0.1 %, Apply 1 Application topically 2 (two) times daily., Disp: 30 g, Rfl: 0   Objective:     There were no vitals filed for this visit.    There is no height or weight on file to calculate BMI.    Physical Exam:    ***   Electronically signed by:  Marshall Skeeter D.[name] 7:42 AM 12/10/23

## 2023-12-11 ENCOUNTER — Ambulatory Visit: Admitting: Sports Medicine

## 2023-12-11 ENCOUNTER — Other Ambulatory Visit: Payer: Self-pay | Admitting: Internal Medicine

## 2023-12-11 VITALS — BP 130/78 | Ht 62.0 in | Wt 163.0 lb

## 2023-12-11 DIAGNOSIS — M79641 Pain in right hand: Secondary | ICD-10-CM | POA: Diagnosis not present

## 2023-12-11 DIAGNOSIS — S63501D Unspecified sprain of right wrist, subsequent encounter: Secondary | ICD-10-CM | POA: Diagnosis not present

## 2023-12-11 NOTE — Patient Instructions (Signed)
 Tylenol  [phone] mg 2-3 times a day for pain relief  Meloxicam  as needed limit to 1x a week call if refill is needed Brace as needed  As needed follow up

## 2023-12-12 NOTE — Telephone Encounter (Signed)
 Requested Prescriptions  Pending Prescriptions Disp Refills   TRESIBA  FLEXTOUCH 100 UNIT/ML FlexTouch Pen [Pharmacy Med Name: TRESIBA  FLEXTOUCH PEN 5'S 100U/ML] 15 mL 2    Sig: INJECT 12 UNITS INTO THE SKIN DAILY     Endocrinology:  Diabetes - Insulins Failed - 12/12/2023  2:45 PM      Failed - Valid encounter within last 6 months    Recent Outpatient Visits           1 month ago Encounter for general adult medical examination with abnormal findings   [place] [place], [place], NP              Passed - HBA1C is between 0 and 7.9 and within 180 days    Hemoglobin A1C  Date Value Ref Range Status  06/22/2014 7.5 (H) 4.2 - 6.3 % Final    Comment:    The American Diabetes Association recommends that a primary goal of therapy should be <7% and that physicians should reevaluate the treatment regimen in patients with HbA1c values consistently >8%.    HbA1c, POC (controlled diabetic range)  Date Value Ref Range Status  01/26/2023 7.0 0.0 - 7.0 % Final   Hgb A1c MFr Bld  Date Value Ref Range Status  11/05/2023 7.4 (H) <5.7 % Final    Comment:    For someone without known diabetes, a hemoglobin A1c value of 6.5% or greater indicates that they may have  diabetes and this should be confirmed with a follow-up  test. . For someone with known diabetes, a value <7% indicates  that their diabetes is well controlled and a value  greater than or equal to 7% indicates suboptimal  control. A1c targets should be individualized based on  duration of diabetes, age, comorbid conditions, and  other considerations. . Currently, no consensus exists regarding use of hemoglobin A1c for diagnosis of diabetes for children. Aaron Aas

## 2023-12-30 ENCOUNTER — Other Ambulatory Visit: Payer: Self-pay

## 2024-01-01 ENCOUNTER — Other Ambulatory Visit: Payer: Self-pay | Admitting: Infectious Diseases

## 2024-01-01 ENCOUNTER — Other Ambulatory Visit: Payer: Self-pay

## 2024-01-01 DIAGNOSIS — Z21 Asymptomatic human immunodeficiency virus [HIV] infection status: Secondary | ICD-10-CM

## 2024-01-01 MED ORDER — DOVATO 50-300 MG PO TABS
1.0000 | ORAL_TABLET | Freq: Every day | ORAL | 0 refills | Status: DC
  Filled 2024-01-01 – 2024-01-08 (×2): qty 30, 30d supply, fill #0

## 2024-01-04 ENCOUNTER — Ambulatory Visit: Payer: BC Managed Care – PPO | Admitting: Infectious Diseases

## 2024-01-04 ENCOUNTER — Other Ambulatory Visit: Payer: Self-pay

## 2024-01-08 ENCOUNTER — Other Ambulatory Visit: Payer: Self-pay

## 2024-01-08 NOTE — Progress Notes (Signed)
 Specialty Pharmacy Refill Coordination Note  [name] is a 68 y.o. female contacted today regarding refills of specialty medication(s) Dolutegravir -lamiVUDine  (Dovato )   Patient requested Delivery   Delivery date: 01/11/24   Verified address: [address] [address]   Medication will be filled on 01/08/24.

## 2024-02-01 DIAGNOSIS — H18601 Keratoconus, unspecified, right eye: Secondary | ICD-10-CM | POA: Diagnosis not present

## 2024-02-01 DIAGNOSIS — H25811 Combined forms of age-related cataract, right eye: Secondary | ICD-10-CM | POA: Diagnosis not present

## 2024-02-04 ENCOUNTER — Other Ambulatory Visit: Payer: Self-pay | Admitting: Infectious Diseases

## 2024-02-04 ENCOUNTER — Other Ambulatory Visit: Payer: Self-pay

## 2024-02-04 ENCOUNTER — Other Ambulatory Visit (HOSPITAL_COMMUNITY): Payer: Self-pay

## 2024-02-04 DIAGNOSIS — Z21 Asymptomatic human immunodeficiency virus [HIV] infection status: Secondary | ICD-10-CM

## 2024-02-04 MED ORDER — DOVATO 50-300 MG PO TABS
1.0000 | ORAL_TABLET | Freq: Every day | ORAL | 0 refills | Status: DC
  Filled 2024-02-04: qty 30, 30d supply, fill #0

## 2024-02-08 ENCOUNTER — Other Ambulatory Visit: Payer: Self-pay | Admitting: Pharmacy Technician

## 2024-02-08 ENCOUNTER — Other Ambulatory Visit: Payer: Self-pay

## 2024-02-08 NOTE — Progress Notes (Signed)
 Specialty Pharmacy Refill Coordination Note  [name] is a 68 y.o. female contacted today regarding refills of specialty medication(s) Dolutegravir -lamiVUDine  (Dovato )   Patient requested Delivery   Delivery date: 02/09/24   Verified address: [address]   [place] [address]   Medication will be filled on 02/08/24.

## 2024-02-09 ENCOUNTER — Other Ambulatory Visit: Payer: Self-pay | Admitting: Internal Medicine

## 2024-02-09 ENCOUNTER — Other Ambulatory Visit: Payer: Self-pay

## 2024-02-10 NOTE — Telephone Encounter (Signed)
 Verified Rx needed at local pharmacy. Verified change in dosage- OV 11/05/23 Requested Prescriptions  Pending Prescriptions Disp Refills   TRESIBA  FLEXTOUCH 100 UNIT/ML FlexTouch Pen [Pharmacy Med Name: TRESIBA  FLEXTOUCH PEN (U-100)[id number]] 15 mL 2    Sig: ADMINISTER 30 UNITS UNDER THE SKIN DAILY     Endocrinology:  Diabetes - Insulins Passed - 02/10/2024 10:43 AM      Passed - HBA1C is between 0 and 7.9 and within 180 days    Hemoglobin A1C  Date Value Ref Range Status  06/22/2014 7.5 (H) 4.2 - 6.3 % Final    Comment:    The American Diabetes Association recommends that a primary goal of therapy should be <7% and that physicians should reevaluate the treatment regimen in patients with HbA1c values consistently >8%.    HbA1c, POC (controlled diabetic range)  Date Value Ref Range Status  01/26/2023 7.0 0.0 - 7.0 % Final   Hgb A1c MFr Bld  Date Value Ref Range Status  11/05/2023 7.4 (H) <5.7 % Final    Comment:    For someone without known diabetes, a hemoglobin A1c value of 6.5% or greater indicates that they may have  diabetes and this should be confirmed with a follow-up  test. . For someone with known diabetes, a value <7% indicates  that their diabetes is well controlled and a value  greater than or equal to 7% indicates suboptimal  control. A1c targets should be individualized based on  duration of diabetes, age, comorbid conditions, and  other considerations. . Currently, no consensus exists regarding use of hemoglobin A1c for diagnosis of diabetes for children. SABRA Amy - Valid encounter within last 6 months    Recent Outpatient Visits           3 months ago Encounter for general adult medical examination with abnormal findings   [place] [facility] [place], Angeline ORN, [place]

## 2024-02-11 ENCOUNTER — Other Ambulatory Visit: Payer: Self-pay | Admitting: Internal Medicine

## 2024-02-11 DIAGNOSIS — E1165 Type 2 diabetes mellitus with hyperglycemia: Secondary | ICD-10-CM

## 2024-02-11 NOTE — Telephone Encounter (Signed)
 Requested Prescriptions  Pending Prescriptions Disp Refills   sitaGLIPtin  (JANUVIA ) 100 MG tablet [Pharmacy Med Name: JANUVIA  TABS 100MG ] 90 tablet 1    Sig: TAKE 1 TABLET DAILY     Endocrinology:  Diabetes - DPP-4 Inhibitors Passed - 02/11/2024  2:58 PM      Passed - HBA1C is between 0 and 7.9 and within 180 days    Hemoglobin A1C  Date Value Ref Range Status  06/22/2014 7.5 (H) 4.2 - 6.3 % Final    Comment:    The American Diabetes Association recommends that a primary goal of therapy should be <7% and that physicians should reevaluate the treatment regimen in patients with HbA1c values consistently >8%.    HbA1c, POC (controlled diabetic range)  Date Value Ref Range Status  01/26/2023 7.0 0.0 - 7.0 % Final   Hgb A1c MFr Bld  Date Value Ref Range Status  11/05/2023 7.4 (H) <5.7 % Final    Comment:    For someone without known diabetes, a hemoglobin A1c value of 6.5% or greater indicates that they may have  diabetes and this should be confirmed with a follow-up  test. . For someone with known diabetes, a value <7% indicates  that their diabetes is well controlled and a value  greater than or equal to 7% indicates suboptimal  control. A1c targets should be individualized based on  duration of diabetes, age, comorbid conditions, and  other considerations. . Currently, no consensus exists regarding use of hemoglobin A1c for diagnosis of diabetes for children. .          Passed - Cr in normal range and within 360 days    Creat  Date Value Ref Range Status  11/05/2023 1.02 0.50 - 1.05 mg/dL Final   Creatinine, Urine  Date Value Ref Range Status  11/05/2023 164 20 - 275 mg/dL Final         Passed - Valid encounter within last 6 months    Recent Outpatient Visits           3 months ago Encounter for general adult medical examination with abnormal findings   [place] [facility] [place], Angeline ORN, NP

## 2024-02-18 ENCOUNTER — Other Ambulatory Visit: Payer: Self-pay

## 2024-02-19 ENCOUNTER — Other Ambulatory Visit: Payer: Self-pay

## 2024-02-19 ENCOUNTER — Other Ambulatory Visit: Payer: Self-pay | Admitting: Internal Medicine

## 2024-02-19 ENCOUNTER — Other Ambulatory Visit: Payer: Self-pay | Admitting: Sports Medicine

## 2024-02-19 ENCOUNTER — Other Ambulatory Visit (HOSPITAL_COMMUNITY): Payer: Self-pay

## 2024-02-23 NOTE — Telephone Encounter (Signed)
 Unable to refill per protocol, Rx expired. Discontinued 07/09/23, not on current list.  Requested Prescriptions  Pending Prescriptions Disp Refills   mirtazapine  (REMERON  SOL-TAB) 30 MG disintegrating tablet [Pharmacy Med Name: MIRTAZAPINE  30MG  ORAL DSNTGRT TABS] 90 tablet 1    Sig: DISSOLVE 1 TABLET(30 MG) ON THE TONGUE AT BEDTIME     Psychiatry: Antidepressants - mirtazapine  Passed - 02/23/2024  8:51 AM      Passed - Completed PHQ-2 or PHQ-9 in the last 360 days      Passed - Valid encounter within last 6 months    Recent Outpatient Visits           3 months ago Encounter for general adult medical examination with abnormal findings   [facility] [place], Angeline ORN, NP               topiramate  (TOPAMAX ) 50 MG tablet [Pharmacy Med Name: TOPIRAMATE  50MG  TABLETS] 90 tablet 1    Sig: TAKE 1 TABLET(50 MG) BY MOUTH TWICE DAILY     Neurology: Anticonvulsants - topiramate  & zonisamide Passed - 02/23/2024  8:51 AM      Passed - Cr in normal range and within 360 days    Creat  Date Value Ref Range Status  11/05/2023 1.02 0.50 - 1.05 mg/dL Final   Creatinine, Urine  Date Value Ref Range Status  11/05/2023 164 20 - 275 mg/dL Final         Passed - CO2 in normal range and within 360 days    CO2  Date Value Ref Range Status  11/05/2023 29 20 - 32 mmol/L Final   Co2  Date Value Ref Range Status  06/23/2014 27 21 - 32 mmol/L Final         Passed - ALT in normal range and within 360 days    ALT  Date Value Ref Range Status  11/05/2023 17 6 - 29 U/L Final   SGPT (ALT)  Date Value Ref Range Status  06/22/2014 45 U/L Final    Comment:    14-63 NOTE: New Reference Range 01/31/14          Passed - AST in normal range and within 360 days    AST  Date Value Ref Range Status  11/05/2023 17 10 - 35 U/L Final   SGOT(AST)  Date Value Ref Range Status  06/22/2014 41 (H) 15 - 37 Unit/L Final         Passed - Completed PHQ-2 or PHQ-9 in the last 360  days      Passed - Valid encounter within last 12 months    Recent Outpatient Visits           3 months ago Encounter for general adult medical examination with abnormal findings   [place] [facility] [place], Angeline ORN, NP

## 2024-02-24 DIAGNOSIS — E1136 Type 2 diabetes mellitus with diabetic cataract: Secondary | ICD-10-CM | POA: Diagnosis not present

## 2024-02-24 DIAGNOSIS — N183 Chronic kidney disease, stage 3 unspecified: Secondary | ICD-10-CM | POA: Diagnosis not present

## 2024-02-24 DIAGNOSIS — I129 Hypertensive chronic kidney disease with stage 1 through stage 4 chronic kidney disease, or unspecified chronic kidney disease: Secondary | ICD-10-CM | POA: Diagnosis not present

## 2024-02-24 DIAGNOSIS — H25811 Combined forms of age-related cataract, right eye: Secondary | ICD-10-CM | POA: Diagnosis not present

## 2024-02-26 ENCOUNTER — Other Ambulatory Visit (HOSPITAL_COMMUNITY): Payer: Self-pay

## 2024-03-01 ENCOUNTER — Other Ambulatory Visit: Payer: Self-pay

## 2024-03-04 ENCOUNTER — Other Ambulatory Visit: Payer: Self-pay | Admitting: Infectious Diseases

## 2024-03-04 ENCOUNTER — Other Ambulatory Visit (HOSPITAL_COMMUNITY): Payer: Self-pay

## 2024-03-04 ENCOUNTER — Ambulatory Visit (INDEPENDENT_AMBULATORY_CARE_PROVIDER_SITE_OTHER): Admitting: Internal Medicine

## 2024-03-04 ENCOUNTER — Encounter: Payer: Self-pay | Admitting: Internal Medicine

## 2024-03-04 ENCOUNTER — Other Ambulatory Visit: Payer: Self-pay

## 2024-03-04 ENCOUNTER — Other Ambulatory Visit: Payer: Self-pay | Admitting: Internal Medicine

## 2024-03-04 VITALS — BP 124/84 | Ht 62.0 in | Wt 163.0 lb

## 2024-03-04 DIAGNOSIS — R42 Dizziness and giddiness: Secondary | ICD-10-CM | POA: Diagnosis not present

## 2024-03-04 DIAGNOSIS — R252 Cramp and spasm: Secondary | ICD-10-CM | POA: Diagnosis not present

## 2024-03-04 DIAGNOSIS — R519 Headache, unspecified: Secondary | ICD-10-CM

## 2024-03-04 DIAGNOSIS — F5101 Primary insomnia: Secondary | ICD-10-CM | POA: Diagnosis not present

## 2024-03-04 DIAGNOSIS — Z21 Asymptomatic human immunodeficiency virus [HIV] infection status: Secondary | ICD-10-CM

## 2024-03-04 MED ORDER — AMITRIPTYLINE HCL 10 MG PO TABS: 10.0000 mg | ORAL_TABLET | Freq: Every day | ORAL | 0 refills | Status: DC

## 2024-03-04 MED ORDER — DOVATO 50-300 MG PO TABS
1.0000 | ORAL_TABLET | Freq: Every day | ORAL | 0 refills | Status: DC
  Filled 2024-03-04: qty 30, 30d supply, fill #0

## 2024-03-04 NOTE — Telephone Encounter (Signed)
 Requested Prescriptions  Pending Prescriptions Disp Refills   rosuvastatin  (CRESTOR ) 10 MG tablet [Pharmacy Med Name: ROSUVASTATIN  TABS 10MG ] 90 tablet 2    Sig: TAKE 1 TABLET DAILY     Cardiovascular:  Antilipid - Statins 2 Failed - 03/04/2024  3:41 PM      Failed - Lipid Panel in normal range within the last 12 months    Cholesterol  Date Value Ref Range Status  11/05/2023 120 <200 mg/dL Final  87/89/7984 788 (H) 0 - 200 mg/dL Final   Ldl Cholesterol, Calc  Date Value Ref Range Status  06/22/2014 SEE COMMENT 0 - 100 mg/dL Final    Comment:    LDL/VLDL - Unable to report VLDL and LDL due to a  - Triglyceride value that is 400 mg/dL or   - greater.    LDL Cholesterol (Calc)  Date Value Ref Range Status  11/05/2023 47 mg/dL (calc) Final    Comment:    Reference range: <100 . Desirable range <100 mg/dL for primary prevention;   <70 mg/dL for patients with CHD or diabetic patients  with > or = 2 CHD risk factors. SABRA LDL-C is now calculated using the Martin-Hopkins  calculation, which is a validated novel method providing  better accuracy than the Friedewald equation in the  estimation of LDL-C.  Gladis APPLETHWAITE et al. SANDREA. 7986;689(80): 2061-2068  (http://education.[facility].com/faq/FAQ164)    Direct LDL  Date Value Ref Range Status  11/07/2014 76.0 mg/dL Final    Comment:    Optimal:  <100 mg/dLNear or Above Optimal:  100-129 mg/dLBorderline High:  130-159 mg/dLHigh:  160-189 mg/dLVery High:  >190 mg/dL   HDL Cholesterol  Date Value Ref Range Status  06/22/2014 42 40 - 60 mg/dL Final   HDL  Date Value Ref Range Status  11/05/2023 53 > OR = 50 mg/dL Final   Triglycerides  Date Value Ref Range Status  11/05/2023 117 <150 mg/dL Final  87/89/7984 526 (H) 0 - 200 mg/dL Final         Passed - Cr in normal range and within 360 days    Creat  Date Value Ref Range Status  11/05/2023 1.02 0.50 - 1.05 mg/dL Final   Creatinine, Urine  Date Value Ref Range Status   11/05/2023 164 20 - 275 mg/dL Final         Passed - Patient is not pregnant      Passed - Valid encounter within last 12 months    Recent Outpatient Visits           Today Frequent headaches   [place] [facility] [place], Angeline ORN, NP   4 months ago Encounter for general adult medical examination with abnormal findings   [place] [facility] [place], Angeline ORN, NP

## 2024-03-04 NOTE — Progress Notes (Signed)
 Specialty Pharmacy Refill Coordination Note  [name] is a 68 y.o. female contacted today regarding refills of specialty medication(s) Dolutegravir -lamiVUDine  (Dovato )   Patient requested Delivery   Delivery date: 03/09/24   Verified address: [address]   [name] [address]   Medication will be filled on 03/08/24.

## 2024-03-04 NOTE — Patient Instructions (Signed)
 Form - Headache Record There are many types and causes of headaches. A headache record can help guide your treatment plan. Use this form to record the details. Bring this form with you to your follow-up visits. Follow your health care provider's instructions on how to describe your headache. You may be asked to: Use a pain scale. This is a tool to rate the intensity of your headache using words or numbers. Describe what your headache feels like, such as dull, achy, throbbing, or sharp. Headache record Date: _______________ Time (from start to end): ____________________ Location of the headache: _________________________ Intensity of the headache: ____________________ Description of the headache: ______________________________________________________________ Hours of sleep the night before the headache: __________ Food or drinks before the headache started: ______________________________________________________________________________________ Events before the headache started: _______________________________________________________________________________________________ Symptoms before the headache started: __________________________________________________________________________________________ Symptoms during the headache: __________________________________________________________________________________________________ Treatment: ________________________________________________________________________________________________________________ Effect of treatment: _________________________________________________________________________________________________________ Other comments: ___________________________________________________________________________________________________________ Date: _______________ Time (from start to end): ____________________ Location of the headache: _________________________ Intensity of the headache: ____________________ Description of the headache:  ______________________________________________________________ Hours of sleep the night before the headache: __________ Food or drinks before the headache started: ______________________________________________________________________________________ Events before the headache started: ____________________________________________________________________________________________ Symptoms before the headache started: _________________________________________________________________________________________ Symptoms during the headache: _______________________________________________________________________________________________ Treatment: ________________________________________________________________________________________________________________ Effect of treatment: _________________________________________________________________________________________________________ Other comments: ___________________________________________________________________________________________________________ Date: _______________ Time (from start to end): ____________________ Location of the headache: _________________________ Intensity of the headache: ____________________ Description of the headache: ______________________________________________________________ Hours of sleep the night before the headache: __________ Food or drinks before the headache started: ______________________________________________________________________________________ Events before the headache started: ____________________________________________________________________________________________ Symptoms before the headache started: _________________________________________________________________________________________ Symptoms during the headache: _______________________________________________________________________________________________ Treatment:  ________________________________________________________________________________________________________________ Effect of treatment: _________________________________________________________________________________________________________ Other comments: ___________________________________________________________________________________________________________ Date: _______________ Time (from start to end): ____________________ Location of the headache: _________________________ Intensity of the headache: ____________________ Description of the headache: ______________________________________________________________ Hours of sleep the night before the headache: _________ Food or drinks before the headache started: ______________________________________________________________________________________ Events before the headache started: ____________________________________________________________________________________________ Symptoms before the headache started: _________________________________________________________________________________________ Symptoms during the headache: _______________________________________________________________________________________________ Treatment: ________________________________________________________________________________________________________________ Effect of treatment: _________________________________________________________________________________________________________ Other comments: ___________________________________________________________________________________________________________ Date: _______________ Time (from start to end): ____________________ Location of the headache: _________________________ Intensity of the headache: ____________________ Description of the headache: ______________________________________________________________ Hours of sleep the night before the headache: _________ Food or drinks before the headache started:  ______________________________________________________________________________________ Events before the headache started: ____________________________________________________________________________________________ Symptoms before the headache started: _________________________________________________________________________________________ Symptoms during the headache: _______________________________________________________________________________________________ Treatment: ________________________________________________________________________________________________________________ Effect of treatment: _________________________________________________________________________________________________________ Other comments: ___________________________________________________________________________________________________________ This information is not intended to replace advice given to you by your health care provider. Make sure you discuss any questions you have with your health care provider. Document Revised: 11/28/2020 Document Reviewed: 11/28/2020 Elsevier Patient Education  2024 [employer].
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
## 2024-03-05 LAB — BASIC METABOLIC PANEL WITH GFR
BUN: 12 mg/dL (ref 7–25)
CO2: 29 mmol/L (ref 20–32)
Calcium: 9.3 mg/dL (ref 8.6–10.4)
Chloride: 105 mmol/L (ref 98–110)
Creat: 1 mg/dL (ref 0.50–1.05)
Glucose, Bld: 61 mg/dL — ABNORMAL LOW (ref 65–99)
Potassium: 4.2 mmol/L (ref 3.5–5.3)
Sodium: 142 mmol/L (ref 135–146)
eGFR: 62 mL/min/1.73m2 (ref >=60)

## 2024-03-05 LAB — MAGNESIUM: Magnesium: 2.1 mg/dL (ref 1.5–2.5)

## 2024-03-07 ENCOUNTER — Ambulatory Visit: Payer: Self-pay | Admitting: Internal Medicine

## 2024-03-08 ENCOUNTER — Other Ambulatory Visit: Payer: Self-pay | Admitting: Internal Medicine

## 2024-03-08 ENCOUNTER — Other Ambulatory Visit: Payer: Self-pay

## 2024-03-08 DIAGNOSIS — E1165 Type 2 diabetes mellitus with hyperglycemia: Secondary | ICD-10-CM

## 2024-03-09 DIAGNOSIS — H25812 Combined forms of age-related cataract, left eye: Secondary | ICD-10-CM | POA: Diagnosis not present

## 2024-03-10 NOTE — Telephone Encounter (Signed)
 Requested Prescriptions  Pending Prescriptions Disp Refills   FREESTYLE TEST STRIPS test strip [Pharmacy Med Name: FREESTYLE TEST STRIPS 50] 100 strip 5    Sig: TEST BLOOD SUGAR TWICE DAILY     Endocrinology: Diabetes - Testing Supplies Passed - 03/10/2024 10:34 AM      Passed - Valid encounter within last 12 months    Recent Outpatient Visits           6 days ago Frequent headaches   [facility] [place], Angeline ORN, NP   4 months ago Encounter for general adult medical examination with abnormal findings   [facility] [place], Angeline ORN, NP

## 2024-03-17 ENCOUNTER — Other Ambulatory Visit: Payer: Self-pay | Admitting: Internal Medicine

## 2024-03-23 ENCOUNTER — Other Ambulatory Visit: Payer: Self-pay

## 2024-03-29 ENCOUNTER — Other Ambulatory Visit: Payer: Self-pay | Admitting: Infectious Diseases

## 2024-03-29 ENCOUNTER — Other Ambulatory Visit (HOSPITAL_COMMUNITY): Payer: Self-pay

## 2024-03-29 ENCOUNTER — Other Ambulatory Visit: Payer: Self-pay

## 2024-03-29 ENCOUNTER — Ambulatory Visit (INDEPENDENT_AMBULATORY_CARE_PROVIDER_SITE_OTHER): Admitting: Infectious Diseases

## 2024-03-29 VITALS — BP 147/91 | HR 64 | Temp 98.1°F | Resp 16 | Wt 165.2 lb

## 2024-03-29 DIAGNOSIS — I10 Essential (primary) hypertension: Secondary | ICD-10-CM

## 2024-03-29 DIAGNOSIS — Z21 Asymptomatic human immunodeficiency virus [HIV] infection status: Secondary | ICD-10-CM

## 2024-03-29 DIAGNOSIS — Z23 Encounter for immunization: Secondary | ICD-10-CM

## 2024-03-29 MED ORDER — DOVATO 50-300 MG PO TABS
1.0000 | ORAL_TABLET | Freq: Every day | ORAL | 11 refills | Status: AC
  Filled 2024-03-29 – 2024-03-30 (×2): qty 30, 30d supply, fill #0
  Filled 2024-04-22: qty 30, 30d supply, fill #1
  Filled 2024-05-26: qty 30, 30d supply, fill #2
  Filled 2024-06-21 – 2024-07-13 (×2): qty 30, 30d supply, fill #3
  Filled 2024-08-04: qty 30, 30d supply, fill #4

## 2024-03-29 NOTE — Patient Instructions (Addendum)
 Always nice to see you!   Blood pressure less than 130/80 is a goal for you.   How to Check Your Blood Pressure at Home -- Step by Step  ?? Before You Take Your BP: Get Ready  Find a quiet space. Sit somewhere peaceful, where you can relax for a few minutes.  Rest for 5 minutes. Sit still and breathe normally.  No caffeine , exercise, or smoking for 30 minutes before checking -- they can raise your BP.  Empty your bladder. A full bladder can make your BP go up without you realizing it.  How to Sit the Right Way  Sit in a chair with your back supported.  Keep your feet flat on the floor (don't cross your legs).  Rest your arm on a table or armrest -- it should be at the same level as your heart.  Stay quiet and still while the machine works -- don't talk.  Step-by-Step: Using Your Home BP Monitor  Wrap the cuff around your bare upper arm.  The bottom of the cuff should sit about 1 inch (2 fingers) above your elbow.  The cuff should be snug, but not tight -- you should be able to slide a finger underneath.  Press the start button.  The cuff will inflate, then slowly deflate.  You'll feel some squeezing -- that's normal.  Wait for the reading.  You'll see two numbers: Write it down. Record the date, time, BP reading, and which arm you used.   ? Final Tips  Be consistent: check at the same times each day.  Don't panic over one high reading -- trends over time are more important.  Bring your logbook or machine to your appointments with Angeline to talk about anything you may need to keep this in good range.

## 2024-03-29 NOTE — Assessment & Plan Note (Signed)
 BP Readings from Last 3 Encounters:  03/29/24 (!) 147/91  03/04/24 124/84  12/11/23 130/78   Blood pressure out of range today however previously doing better earlier this year. I asked her to please keep record of them at home - discussed how to take a proper blood pressure reading and how to record them.  Goal < 130/80 with diabetes history and CVA.  She had no questions for me.

## 2024-03-29 NOTE — Assessment & Plan Note (Signed)
 Very well controlled on once daily Dovato . No concerns with access or adherence to medication. They are tolerating the medication well without side effects. No drug interactions identified. Pertinent lab tests ordered today.  No changes to insurance coverage.  No dental needs today.  Mood is doing better with improved situations at home  Sexual health - last pap smear in 2020 was normal cytology, HPV (-). Will discuss further recommendations to screen at next appointment.  Vaccines updated today - see health maintenance section.   RTC in 41m for follow up

## 2024-03-29 NOTE — Progress Notes (Signed)
 Patient: [name]  DOB: Jan 17, 1956 MRN: [id number] PCP: Antonette Angeline ORN, NP    Subjective:  Brief ID:  [name] is a 68 y.o. female with HIV infection diagnosed August 2019 on routine screening after having had a stroke. VL 118,000 copies, CD4 500.  HIV Risk: heterosexual.  OI Hx: none  Previous Regimens:  Biktarvy  02-2018: suppressed  Dovato  05/2022 (CKD switch)   Resistance Testing:  02-2018 wildtype virus    Chief Complaint  Patient presents with   Follow-up    B20      HPI:  [name] is a 68 y.o. female is here for routine follow up care. LOV was in December and viral load was < 20 and CD4 851 at that time on Dovato  once daily. She is doing very well.  Home life is improved which she is happy to report. Work is stressful with rude customers but she gets through it. She is looking forward to a singing event this weekend with her group.  She has a high blood pressure reading today and wants to recheck it. She was taken off her BP meds earlier d/t side effects of dizziness. H/o stroke in the past.   She had cataract surgery earlier this year and that was so helpful for her vision.    Review of Systems  Constitutional:  Negative for chills and fever.  HENT:  Negative for sore throat.   Respiratory:  Negative for cough and shortness of breath.   Cardiovascular: Negative.   Gastrointestinal:  Negative for abdominal pain, diarrhea and vomiting.  Musculoskeletal:  Negative for myalgias and neck pain.  Skin:  Negative for rash.  Neurological:  Negative for headaches.  Psychiatric/Behavioral:  The patient is not nervous/anxious.      Past Medical History:  Diagnosis Date   Arthritis    fingers   Asthma    no trouble lately   Diabetes mellitus without complication (HCC) 11/29/2012   hx. NIDDM-dx. 3 weeks ago   Headache    migraines - none since stroke   HIV (human immunodeficiency virus infection) (HCC)    Hyperlipidemia    hx    Hypertension    Kidney disease    staged three   Seasonal allergies    has cough   Stroke (HCC) 02/18/2018   Residual Left side weakness and speech issues   Ulcerative colitis (HCC)    Vertigo    benign   Wears contact lenses    Wears partial dentures    upper/ has lower but doesn't wear    Outpatient Medications Prior to Visit  Medication Sig Dispense Refill   albuterol  (VENTOLIN  HFA) 108 (90 Base) MCG/ACT inhaler Inhale 2 puffs into the lungs every 4 (four) hours as needed for wheezing or shortness of breath. 8 g 2   ALLERGY RELIEF 180 MG tablet Take 180 mg by mouth daily.     amitriptyline  (ELAVIL ) 10 MG tablet Take 1 tablet (10 mg total) by mouth at bedtime. 30 tablet 0   aspirin  EC 81 MG EC tablet Take 1 tablet (81 mg total) by mouth daily.     azelastine  (ASTELIN ) 0.1 % nasal spray Place 2 sprays into both nostrils 2 (two) times daily. Use in each nostril as directed (Patient taking differently: Place 2 sprays into both nostrils as needed. Use in each nostril as directed) 30 mL 12   B-D UF III MINI PEN NEEDLES 31G X 5 MM MISC USE TO INJECT INSULIN  SIX TIMES  DAILY 90 each 23   EPINEPHrine  0.3 mg/0.3 mL IJ SOAJ injection as directed Injection PRN; Duration: 30 days     ezetimibe  (ZETIA ) 10 MG tablet TAKE 1 TABLET DAILY 90 tablet 1   FREESTYLE TEST STRIPS test strip TEST BLOOD SUGAR TWICE DAILY 100 strip 5   glipiZIDE  (GLUCOTROL ) 10 MG tablet Take 1 tablet (10 mg total) by mouth 2 (two) times daily before a meal. 180 tablet 2   glucose 4 GM chewable tablet Chew 1 tablet (4 g total) by mouth as needed for low blood sugar. 30 tablet 3   montelukast  (SINGULAIR ) 10 MG tablet Take 1 tablet (10 mg total) by mouth at bedtime. 90 tablet 3   potassium chloride  SA (KLOR-CON  M20) 20 MEQ tablet Take 1 tablet (20 mEq total) by mouth daily. 90 tablet 1   rosuvastatin  (CRESTOR ) 10 MG tablet TAKE 1 TABLET DAILY 90 tablet 2   sitaGLIPtin  (JANUVIA ) 100 MG tablet TAKE 1 TABLET DAILY 90 tablet 1    TRESIBA  FLEXTOUCH 100 UNIT/ML FlexTouch Pen ADMINISTER 30 UNITS UNDER THE SKIN DAILY 15 mL 2   triamcinolone  cream (KENALOG ) 0.1 % Apply 1 Application topically 2 (two) times daily. 30 g 0   dolutegravir -lamiVUDine  (DOVATO ) 50-300 MG tablet Take 1 tablet by mouth daily. 30 tablet 0   No facility-administered medications prior to visit.     Allergies  Allergen Reactions   Azithromycin  Itching    Muscle aches   Erythromycin Nausea And Vomiting   Penicillins Nausea And Vomiting    Did it involve swelling of the face/tongue/throat, SOB, or low BP? No Did it involve sudden or severe rash/hives, skin peeling, or any reaction on the inside of your mouth or nose? No Did you need to seek medical attention at a hospital or doctor's office? No When did it last happen?      childhood allergy If all above answers are "NO", may proceed with cephalosporin use.    Tetracycline Nausea And Vomiting    Social History   Tobacco Use   Smoking status: Never   Smokeless tobacco: Never  Vaping Use   Vaping status: Never Used  Substance Use Topics   Alcohol use: Not Currently    Alcohol/week: 0.0 standard drinks of alcohol   Drug use: No    Objective:   Vitals:   03/29/24 0904 03/29/24 0940  BP: (!) 160/82 (!) 147/91  Pulse: 64   Resp: 16   Temp: 98.1 F (36.7 C)   TempSrc: Oral   SpO2: 98%   Weight: 165 lb 3.2 oz (74.9 kg)     Body mass index is 30.22 kg/m.  Physical Exam Constitutional:      Appearance: Normal appearance. She is not ill-appearing.  HENT:     Mouth/Throat:     Mouth: Mucous membranes are moist.     Pharynx: Oropharynx is clear.  Eyes:     General: No scleral icterus. Cardiovascular:     Rate and Rhythm: Normal rate and regular rhythm.  Pulmonary:     Effort: Pulmonary effort is normal.  Neurological:     Mental Status: She is oriented to person, place, and time.  Psychiatric:        Mood and Affect: Mood normal.        Thought Content: Thought content  normal.     Lab Results: HIV 1 RNA Quant (Copies/mL)  Date Value  06/23/2023 Not Detected  11/21/2022 Not Detected  07/11/2022 Not Detected   CD4 T Cell  Abs (/uL)  Date Value  06/23/2023 851  07/24/2021 885  11/26/2020 952    Lab Results  Component Value Date   WBC 5.2 11/05/2023   HGB 12.2 11/05/2023   HCT 37.4 11/05/2023   MCV 92.6 11/05/2023   PLT 226 11/05/2023    Lab Results  Component Value Date   CREATININE 1.00 03/04/2024   BUN 12 03/04/2024   NA 142 03/04/2024   K 4.2 03/04/2024   CL 105 03/04/2024   CO2 29 03/04/2024    Lab Results  Component Value Date   ALT 17 11/05/2023   AST 17 11/05/2023   ALKPHOS 104 09/25/2020   BILITOT 0.3 11/05/2023     Assessment & Plan:   Problem List Items Addressed This Visit       Unprioritized   Asymptomatic HIV infection, with no history of HIV-related illness (HCC)   Very well controlled on once daily Dovato . No concerns with access or adherence to medication. They are tolerating the medication well without side effects. No drug interactions identified. Pertinent lab tests ordered today.  No changes to insurance coverage.  No dental needs today.  Mood is doing better with improved situations at home  Sexual health - last pap smear in 2020 was normal cytology, HPV (-). Will discuss further recommendations to screen at next appointment.  Vaccines updated today - see health maintenance section.   RTC in 77m for follow up        Relevant Medications   dolutegravir -lamiVUDine  (DOVATO ) 50-300 MG tablet   Other Relevant Orders   HIV 1 RNA quant-no reflex-bld   T-helper cells (CD4) count   Measles/Mumps/Rubella Immunity   HTN (hypertension)   BP Readings from Last 3 Encounters:  03/29/24 (!) 147/91  03/04/24 124/84  12/11/23 130/78   Blood pressure out of range today however previously doing better earlier this year. I asked her to please keep record of them at home - discussed how to take a proper blood  pressure reading and how to record them.  Goal < 130/80 with diabetes history and CVA.  She had no questions for me.       Other Visit Diagnoses       Encounter for immunization    -  Primary   Relevant Orders   Flu vaccine HIGH DOSE PF(Fluzone Trivalent) (Completed)      Health Maintenance -  Flu shot provided today  MMR titer obtained to determine if she needs measles booster vaccine  Will discuss pap smear with her next OV.    Meds ordered this encounter  Medications   dolutegravir -lamiVUDine  (DOVATO ) 50-300 MG tablet    Sig: Take 1 tablet by mouth daily.    Dispense:  30 tablet    Refill:  11    Prescription Type::   Renewal   Orders Placed This Encounter  Procedures   Flu vaccine HIGH DOSE PF(Fluzone Trivalent)   HIV 1 RNA quant-no reflex-bld   T-helper cells (CD4) count   Measles/Mumps/Rubella Immunity   FU in 49m    Corean Fireman, MSN, NP-C [facility]  [email or web address] Pager: [phone] Office: [phone] [facility] Main Line: [phone]    03/29/24  10:20 AM

## 2024-03-30 ENCOUNTER — Other Ambulatory Visit: Payer: Self-pay

## 2024-03-30 NOTE — Progress Notes (Signed)
 Specialty Pharmacy Refill Coordination Note  [name] is a 68 y.o. female contacted today regarding refills of specialty medication(s) Dolutegravir -lamiVUDine  (Dovato )   Patient requested Delivery   Delivery date: 04/01/24   Verified address: [address]   [place] [address]   Medication will be filled on 03/31/2024.

## 2024-03-30 NOTE — Progress Notes (Signed)
 Specialty Pharmacy Ongoing Clinical Assessment Note  [name] is a 68 y.o. female who is being followed by the specialty pharmacy service for RxSp HIV   Patient's specialty medication(s) reviewed today: Dolutegravir -lamiVUDine  (Dovato )   Missed doses in the last 4 weeks: 0   Patient/Caregiver did not have any additional questions or concerns.   Therapeutic benefit summary: Patient is achieving benefit   Adverse events/side effects summary: No adverse events/side effects   Patient's therapy is appropriate to: Continue    Goals Addressed             This Visit's Progress    Achieve Undetectable HIV Viral Load < 20       Patient had appointment yesterday 9/16; VL and CD4 not yet resulted. Patient will maintain adherence. VL has been undetected since 06/07/2020, otherwise.         Follow up: 1 year  [place] CHRISTELLA Gallus Specialty Pharmacist

## 2024-03-31 ENCOUNTER — Other Ambulatory Visit: Payer: Self-pay

## 2024-03-31 LAB — T-HELPER CELLS (CD4) COUNT (NOT AT ARMC)
CD4 % Helper T Cell: 44 % (ref 33–65)
CD4 T Cell Abs: 799 /uL (ref 400–1790)

## 2024-04-01 ENCOUNTER — Other Ambulatory Visit (HOSPITAL_COMMUNITY): Payer: Self-pay

## 2024-04-01 LAB — HIV-1 RNA QUANT-NO REFLEX-BLD
HIV 1 RNA Quant: NOT DETECTED {copies}/mL
HIV-1 RNA Quant, Log: NOT DETECTED {Log_copies}/mL

## 2024-04-01 LAB — MEASLES/MUMPS/RUBELLA IMMUNITY
Mumps IgG: >300 [AU]/ml
Rubella: 10.8 {index}
Rubeola IgG: >300 [AU]/ml

## 2024-04-07 ENCOUNTER — Ambulatory Visit: Payer: Self-pay | Admitting: Infectious Diseases

## 2024-04-11 ENCOUNTER — Other Ambulatory Visit: Payer: Self-pay | Admitting: Internal Medicine

## 2024-04-12 NOTE — Telephone Encounter (Signed)
 Requested Prescriptions  Pending Prescriptions Disp Refills   ezetimibe  (ZETIA ) 10 MG tablet [Pharmacy Med Name: EZETIMIBE  TABS 10MG ] 90 tablet 1    Sig: TAKE 1 TABLET DAILY     Cardiovascular:  Antilipid - Sterol Transport Inhibitors Failed - 04/12/2024  3:41 PM      Failed - Lipid Panel in normal range within the last 12 months    Cholesterol  Date Value Ref Range Status  11/05/2023 120 <200 mg/dL Final  87/89/7984 788 (H) 0 - 200 mg/dL Final   Ldl Cholesterol, Calc  Date Value Ref Range Status  06/22/2014 SEE COMMENT 0 - 100 mg/dL Final    Comment:    LDL/VLDL - Unable to report VLDL and LDL due to a  - Triglyceride value that is 400 mg/dL or   - greater.    LDL Cholesterol (Calc)  Date Value Ref Range Status  11/05/2023 47 mg/dL (calc) Final    Comment:    Reference range: <100 . Desirable range <100 mg/dL for primary prevention;   <70 mg/dL for patients with CHD or diabetic patients  with > or = 2 CHD risk factors. SABRA LDL-C is now calculated using the Martin-Hopkins  calculation, which is a validated novel method providing  better accuracy than the Friedewald equation in the  estimation of LDL-C.  Gladis APPLETHWAITE et al. SANDREA. 7986;689(80): 2061-2068  (http://education.[employer].com/faq/FAQ164)    Direct LDL  Date Value Ref Range Status  11/07/2014 76.0 mg/dL Final    Comment:    Optimal:  <100 mg/dLNear or Above Optimal:  100-129 mg/dLBorderline High:  130-159 mg/dLHigh:  160-189 mg/dLVery High:  >190 mg/dL   HDL Cholesterol  Date Value Ref Range Status  06/22/2014 42 40 - 60 mg/dL Final   HDL  Date Value Ref Range Status  11/05/2023 53 > OR = 50 mg/dL Final   Triglycerides  Date Value Ref Range Status  11/05/2023 117 <150 mg/dL Final  87/89/7984 526 (H) 0 - 200 mg/dL Final         Passed - AST in normal range and within 360 days    AST  Date Value Ref Range Status  11/05/2023 17 10 - 35 U/L Final   SGOT(AST)  Date Value Ref Range Status   06/22/2014 41 (H) 15 - 37 Unit/L Final         Passed - ALT in normal range and within 360 days    ALT  Date Value Ref Range Status  11/05/2023 17 6 - 29 U/L Final   SGPT (ALT)  Date Value Ref Range Status  06/22/2014 45 U/L Final    Comment:    14-63 NOTE: New Reference Range 01/31/14          Passed - Patient is not pregnant      Passed - Valid encounter within last 12 months    Recent Outpatient Visits           1 month ago Frequent headaches   [place] [facility] [place], Angeline ORN, NP   5 months ago Encounter for general adult medical examination with abnormal findings   [place] [facility] [place], Angeline ORN, NP

## 2024-04-22 ENCOUNTER — Other Ambulatory Visit: Payer: Self-pay

## 2024-04-22 ENCOUNTER — Encounter (INDEPENDENT_AMBULATORY_CARE_PROVIDER_SITE_OTHER): Payer: Self-pay

## 2024-04-22 NOTE — Progress Notes (Signed)
 Specialty Pharmacy Refill Coordination Note  [name] is a 68 y.o. female contacted today regarding refills of specialty medication(s) Dovato   Patient requested (Patient-Rptd) Delivery   Delivery date: 04/26/24  Verified address: (Patient-Rptd) [address] [place]   Medication will be filled on 04/25/24.

## 2024-04-25 ENCOUNTER — Other Ambulatory Visit: Payer: Self-pay

## 2024-05-01 ENCOUNTER — Other Ambulatory Visit: Payer: Self-pay | Admitting: Internal Medicine

## 2024-05-01 DIAGNOSIS — E1165 Type 2 diabetes mellitus with hyperglycemia: Secondary | ICD-10-CM

## 2024-05-02 ENCOUNTER — Other Ambulatory Visit: Payer: Self-pay | Admitting: Internal Medicine

## 2024-05-03 NOTE — Telephone Encounter (Signed)
 Requested Prescriptions  Pending Prescriptions Disp Refills   Insulin  Pen Needle (EMBECTA PEN NEEDLE ULTRAFINE) 31G X 5 MM MISC [Pharmacy Med Name: EM PEN NED UF MINI 90'S 31G3/16] 90 each 11    Sig: USE TO INJECT INSULIN  SIX TIMES DAILY     Endocrinology: Diabetes - Testing Supplies Passed - 05/03/2024  2:06 PM      Passed - Valid encounter within last 12 months    Recent Outpatient Visits           2 months ago Frequent headaches   [facility] [place], Angeline ORN, NP   6 months ago Encounter for general adult medical examination with abnormal findings   [place] [facility], Angeline ORN, NP              Refused Prescriptions Disp Refills   FREESTYLE LITE test strip [place] Med Name: FREESTYLE LITE STRIPS 100'S] 100 strip 6    Sig: USE TO TEST BLOOD SUGAR TWICE A DAY     Endocrinology: Diabetes - Testing Supplies Passed - 05/03/2024  2:06 PM      Passed - Valid encounter within last 12 months    Recent Outpatient Visits           2 months ago Frequent headaches   [place] [facility] [place], Angeline ORN, NP   6 months ago Encounter for general adult medical examination with abnormal findings   [place] [facility] [place], Angeline ORN, NP

## 2024-05-04 NOTE — Telephone Encounter (Signed)
 Requested Prescriptions  Pending Prescriptions Disp Refills   glipiZIDE  (GLUCOTROL ) 10 MG tablet [Pharmacy Med Name: GLIPIZIDE  10MG  TABLETS] 180 tablet 2    Sig: TAKE 1 TABLET(10 MG) BY MOUTH TWICE DAILY BEFORE A MEAL     Endocrinology:  Diabetes - Sulfonylureas Failed - 05/04/2024  3:21 PM      Failed - HBA1C is between 0 and 7.9 and within 180 days    Hemoglobin A1C  Date Value Ref Range Status  06/22/2014 7.5 (H) 4.2 - 6.3 % Final    Comment:    The American Diabetes Association recommends that a primary goal of therapy should be <7% and that physicians should reevaluate the treatment regimen in patients with HbA1c values consistently >8%.    HbA1c, POC (controlled diabetic range)  Date Value Ref Range Status  01/26/2023 7.0 0.0 - 7.0 % Final   Hgb A1c MFr Bld  Date Value Ref Range Status  11/05/2023 7.4 (H) <5.7 % Final    Comment:    For someone without known diabetes, a hemoglobin A1c value of 6.5% or greater indicates that they may have  diabetes and this should be confirmed with a follow-up  test. . For someone with known diabetes, a value <7% indicates  that their diabetes is well controlled and a value  greater than or equal to 7% indicates suboptimal  control. A1c targets should be individualized based on  duration of diabetes, age, comorbid conditions, and  other considerations. . Currently, no consensus exists regarding use of hemoglobin A1c for diagnosis of diabetes for children. .          Failed - Valid encounter within last 6 months    Recent Outpatient Visits           2 months ago Frequent headaches   [place] [facility] [place], Angeline ORN, NP   6 months ago Encounter for general adult medical examination with abnormal findings   [place] [facility] [place], Angeline ORN, NP              Passed - Cr in normal range and within 360 days    Creat  Date Value Ref Range Status  03/04/2024 1.00 0.50 - 1.05 mg/dL  Final   Creatinine, Urine  Date Value Ref Range Status  11/05/2023 164 20 - 275 mg/dL Final

## 2024-05-06 ENCOUNTER — Ambulatory Visit: Admitting: Internal Medicine

## 2024-05-06 ENCOUNTER — Encounter: Payer: Self-pay | Admitting: Internal Medicine

## 2024-05-06 VITALS — BP 128/80 | Ht 62.0 in | Wt 165.2 lb

## 2024-05-06 DIAGNOSIS — E785 Hyperlipidemia, unspecified: Secondary | ICD-10-CM

## 2024-05-06 DIAGNOSIS — I69354 Hemiplegia and hemiparesis following cerebral infarction affecting left non-dominant side: Secondary | ICD-10-CM

## 2024-05-06 DIAGNOSIS — F339 Major depressive disorder, recurrent, unspecified: Secondary | ICD-10-CM

## 2024-05-06 DIAGNOSIS — G43019 Migraine without aura, intractable, without status migrainosus: Secondary | ICD-10-CM | POA: Diagnosis not present

## 2024-05-06 DIAGNOSIS — Z21 Asymptomatic human immunodeficiency virus [HIV] infection status: Secondary | ICD-10-CM

## 2024-05-06 DIAGNOSIS — E1165 Type 2 diabetes mellitus with hyperglycemia: Secondary | ICD-10-CM | POA: Diagnosis not present

## 2024-05-06 DIAGNOSIS — E6609 Other obesity due to excess calories: Secondary | ICD-10-CM

## 2024-05-06 DIAGNOSIS — L308 Other specified dermatitis: Secondary | ICD-10-CM

## 2024-05-06 DIAGNOSIS — Z7984 Long term (current) use of oral hypoglycemic drugs: Secondary | ICD-10-CM

## 2024-05-06 DIAGNOSIS — I10 Essential (primary) hypertension: Secondary | ICD-10-CM | POA: Diagnosis not present

## 2024-05-06 DIAGNOSIS — Z8673 Personal history of transient ischemic attack (TIA), and cerebral infarction without residual deficits: Secondary | ICD-10-CM

## 2024-05-06 DIAGNOSIS — K5190 Ulcerative colitis, unspecified, without complications: Secondary | ICD-10-CM | POA: Diagnosis not present

## 2024-05-06 DIAGNOSIS — E66811 Obesity, class 1: Secondary | ICD-10-CM

## 2024-05-06 DIAGNOSIS — N1831 Chronic kidney disease, stage 3a: Secondary | ICD-10-CM

## 2024-05-06 DIAGNOSIS — M150 Primary generalized (osteo)arthritis: Secondary | ICD-10-CM

## 2024-05-06 DIAGNOSIS — E1169 Type 2 diabetes mellitus with other specified complication: Secondary | ICD-10-CM

## 2024-05-06 DIAGNOSIS — Z794 Long term (current) use of insulin: Secondary | ICD-10-CM

## 2024-05-06 LAB — HEMOGLOBIN A1C
Hgb A1c MFr Bld: 7.4 % — ABNORMAL HIGH (ref <5.7)
Mean Plasma Glucose: 166 mg/dL
eAG (mmol/L): 9.2 mmol/L

## 2024-05-06 LAB — LIPID PANEL
Cholesterol: 114 mg/dL (ref <200)
HDL: 50 mg/dL (ref >=50)
LDL Cholesterol (Calc): 43 mg/dL
Non-HDL Cholesterol (Calc): 64 mg/dL (ref <130)
Total CHOL/HDL Ratio: 2.3 (calc) (ref <5.0)
Triglycerides: 129 mg/dL (ref <150)

## 2024-05-06 MED ORDER — AMITRIPTYLINE HCL 10 MG PO TABS: 10.0000 mg | ORAL_TABLET | Freq: Every day | ORAL | 1 refills | Status: AC

## 2024-05-06 NOTE — Assessment & Plan Note (Signed)
 Kidney function reviewed Not currently on ACEI/ARB

## 2024-05-06 NOTE — Assessment & Plan Note (Signed)
 Complicated by obesity Lipid profile today Encouraged her to consume a low-fat diet Continue rosuvastatin  10 mg and ezetimibe  10 mg daily

## 2024-05-06 NOTE — Assessment & Plan Note (Signed)
Encourage daily strengthening exercises

## 2024-05-06 NOTE — Assessment & Plan Note (Signed)
 Continue dovato  50-300 mg daily per ID

## 2024-05-06 NOTE — Assessment & Plan Note (Signed)
 Complicated by obesity Controlled off medications We will monitor
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
## 2024-05-06 NOTE — Assessment & Plan Note (Signed)
 Complicated by obesity A1c today Urine microalbumin has been checked within the last year Encouraged low-carb diet and exercise for weight loss Continue glipizide  10 mg twice daily, januvia  100 mg daily and tresiba  30 units daily Encourage routine eye exam Encouraged routine foot exam Immunizations UTD

## 2024-05-06 NOTE — Assessment & Plan Note (Signed)
 Okay to take tylenol  arthritis OTC as needed

## 2024-05-06 NOTE — Assessment & Plan Note (Signed)
 Stable off meds at this time Support offered

## 2024-05-06 NOTE — Assessment & Plan Note (Signed)
 Will have her restart amitriptyline  10 mg at bedtime Monitor

## 2024-05-06 NOTE — Assessment & Plan Note (Signed)
Asymptomatic off meds We will monitor

## 2024-05-06 NOTE — Assessment & Plan Note (Signed)
 Discussed the importance of good blood pressure, cholesterol and diabetes control Lipid profile today Encouraged her consume a low-fat diet Continue rosuvastatin  10 mg, ezetimibe  10 mg and aspirin  81 mg daily

## 2024-05-06 NOTE — Assessment & Plan Note (Signed)
 Continue triamcinolone  0.1% daily as needed Avoid overuse as this can lead to hypopigmentation of the skin

## 2024-05-06 NOTE — Assessment & Plan Note (Signed)
 Encourage diet and exercise for weight loss

## 2024-05-06 NOTE — Patient Instructions (Signed)
 Patches of Discolored Skin (Vitiligo): What to Know Vitiligo is a long-term (chronic) skin disease that causes patches of discolored skin. These patches turn milky white because the skin loses its natural color. Some people may also lose color in their hair, eyes, or inside their mouth. There are two main types of vitiligo: Nonsegmental or bilateral vitiligo. This type affects both sides of the body, often on the face, hands, arms, knees, or feet. It can come and go. Segmental or unilateral vitiligo. This type affects one side of the body, often on the face, arm, or leg. Some people also lose hair color. It can get bad for a while and then stop. Vitiligo can affect anyone, but it's more noticeable in people with darker skin. It can't be passed from person to person. But, it can cause emotional stress. What are the causes? The cause isn't known. It may involve: Your body's defense system, or immune system, attacking your skin cells that produce color. This is an autoimmune disease. Your genetics. Vitiligo can run in families. What increases the risk? Having a family history of vitiligo. Having another autoimmune disease, such as rheumatoid arthritis, autoimmune thyroid disease, or type 1 diabetes. What are the signs or symptoms?  The main symptom of vitiligo is loss of color in the skin, hair, eyes, and inside the mouth. Some people with vitiligo may have a higher risk of losing their hearing or eyesight. How is this diagnosed? Your provider may do a physical exam of your skin to look for discolored patches. You may also need blood tests to check for other autoimmune diseases. How is this treated? There's no cure, but treatments can help. These can include: Makeup or self-tanning lotions to add color to areas that have lost color. Hair dye to add color to white patches of hair. Skin creams, such as steroid or vitamin D creams, to bring back some color. Ultraviolet light treatment. Medicines to  lighten darker skin to make white patches less visible. Skin grafting surgery to transfer darker skin to patchy white areas. Surgical tattooing to add color to white patches. Follow these instructions at home: Take your medicines only as told. Protect your skin from the sun. Sunburn can make vitiligo worse. Use sunscreen with an SPF of 30 or higher. Cover affected skin with clothing or wear a hat when you're outside. Manage stress. Stress can make vitiligo worse. Get counseling and support if vitiligo affects your emotions. Keep all follow-up visits to watch your condition. Contact a health care provider if: Your condition is getting worse. You're struggling with depression or anxiety. You notice changes in hearing or vision. This information is not intended to replace advice given to you by your health care provider. Make sure you discuss any questions you have with your health care provider. Document Revised: 02/27/2023 Document Reviewed: 02/27/2023 Elsevier Patient Education  2024 [employer].

## 2024-05-09 ENCOUNTER — Ambulatory Visit: Payer: Self-pay | Admitting: Internal Medicine

## 2024-05-11 ENCOUNTER — Other Ambulatory Visit: Payer: Self-pay | Admitting: Internal Medicine

## 2024-05-12 NOTE — Telephone Encounter (Signed)
 Requested by interface surescripts. Future visit 11/08/24.  Requested Prescriptions  Pending Prescriptions Disp Refills   potassium chloride  SA (KLOR-CON  M) 20 MEQ tablet [Pharmacy Med Name: POT CHLOR ER (DISP) TABS 20MEQ] 90 tablet 1    Sig: TAKE 1 TABLET DAILY     Endocrinology:  Minerals - Potassium Supplementation Passed - 05/12/2024  1:53 PM      Passed - K in normal range and within 360 days    Potassium  Date Value Ref Range Status  03/04/2024 4.2 3.5 - 5.3 mmol/L Final  06/23/2014 4.2 3.5 - 5.1 mmol/L Final         Passed - Cr in normal range and within 360 days    Creat  Date Value Ref Range Status  03/04/2024 1.00 0.50 - 1.05 mg/dL Final   Creatinine, Urine  Date Value Ref Range Status  11/05/2023 164 20 - 275 mg/dL Final         Passed - Valid encounter within last 12 months    Recent Outpatient Visits           6 days ago Type 2 diabetes mellitus with hyperglycemia, with long-term current use of insulin  [facility])   [facility] [place], Angeline ORN, NP   2 months ago Frequent headaches   [facility] [place], Angeline ORN, NP   6 months ago Encounter for general adult medical examination with abnormal findings   [facility] [place], Angeline ORN, NP

## 2024-05-17 ENCOUNTER — Other Ambulatory Visit: Payer: Self-pay

## 2024-05-26 ENCOUNTER — Encounter (INDEPENDENT_AMBULATORY_CARE_PROVIDER_SITE_OTHER): Payer: Self-pay

## 2024-05-26 ENCOUNTER — Other Ambulatory Visit: Payer: Self-pay

## 2024-05-27 ENCOUNTER — Other Ambulatory Visit (HOSPITAL_COMMUNITY): Payer: Self-pay

## 2024-05-27 ENCOUNTER — Other Ambulatory Visit: Payer: Self-pay

## 2024-05-27 NOTE — Progress Notes (Signed)
 Specialty Pharmacy Refill Coordination Note  MyChart Questionnaire Submission  [name] is a 68 y.o. female contacted today regarding refills of specialty medication(s) Dovato .  Doses on hand: (Patient-Rptd) 8   Patient requested: (Patient-Rptd) Delivery   Delivery date: 05/31/24  Verified address: [address] [place] [address]  Medication will be filled on 05/30/24.

## 2024-05-30 ENCOUNTER — Other Ambulatory Visit: Payer: Self-pay

## 2024-06-21 ENCOUNTER — Other Ambulatory Visit: Payer: Self-pay

## 2024-06-24 ENCOUNTER — Other Ambulatory Visit: Payer: Self-pay

## 2024-06-28 ENCOUNTER — Other Ambulatory Visit (HOSPITAL_COMMUNITY): Payer: Self-pay

## 2024-07-13 ENCOUNTER — Other Ambulatory Visit (HOSPITAL_COMMUNITY): Payer: Self-pay

## 2024-07-13 ENCOUNTER — Other Ambulatory Visit: Payer: Self-pay

## 2024-07-13 NOTE — Progress Notes (Signed)
 Specialty Pharmacy Refill Coordination Note  [name] is a 68 y.o. female contacted today regarding refills of specialty medication(s) Dolutegravir -lamiVUDine  (Dovato )   Patient requested Delivery   Delivery date: 07/15/24   Verified address: [address] [place] [address]   Medication will be filled on: 07/13/24

## 2024-07-25 NOTE — Progress Notes (Signed)
 The ASCVD Risk score (Arnett DK, et al., 2019) failed to calculate for the following reasons:   Risk score cannot be calculated because patient has a medical history suggesting prior/existing ASCVD   * - Cholesterol units were assumed  Duwaine Lowe, BSN, [facility]

## 2024-08-04 ENCOUNTER — Other Ambulatory Visit: Payer: Self-pay

## 2024-08-08 ENCOUNTER — Other Ambulatory Visit: Payer: Self-pay

## 2024-08-08 NOTE — Progress Notes (Signed)
 Specialty Pharmacy Refill Coordination Note  [name] is a 69 y.o. female contacted today regarding refills of specialty medication(s) Dolutegravir -lamiVUDine  (Dovato )   Patient requested Delivery   Delivery date: 08/11/24   Verified address: [address] [place] [address]   Medication will be filled on: 08/10/24

## 2024-08-09 ENCOUNTER — Other Ambulatory Visit: Payer: Self-pay | Admitting: Internal Medicine

## 2024-08-09 DIAGNOSIS — E1165 Type 2 diabetes mellitus with hyperglycemia: Secondary | ICD-10-CM

## 2024-08-09 NOTE — Telephone Encounter (Signed)
 Requested Prescriptions  Pending Prescriptions Disp Refills   JANUVIA  100 MG tablet [Pharmacy Med Name: JANUVIA  TABS 100MG ] 90 tablet 0    Sig: TAKE 1 TABLET DAILY     Endocrinology:  Diabetes - DPP-4 Inhibitors Passed - 08/09/2024  5:15 PM      Passed - HBA1C is between 0 and 7.9 and within 180 days    Hemoglobin A1C  Date Value Ref Range Status  06/22/2014 7.5 (H) 4.2 - 6.3 % Final    Comment:    The American Diabetes Association recommends that a primary goal of therapy should be <7% and that physicians should reevaluate the treatment regimen in patients with HbA1c values consistently >8%.    HbA1c, POC (controlled diabetic range)  Date Value Ref Range Status  01/26/2023 7.0 0.0 - 7.0 % Final   Hgb A1c MFr Bld  Date Value Ref Range Status  05/06/2024 7.4 (H) <5.7 % Final    Comment:    For someone without known diabetes, a hemoglobin A1c value of 6.5% or greater indicates that they may have  diabetes and this should be confirmed with a follow-up  test. . For someone with known diabetes, a value <7% indicates  that their diabetes is well controlled and a value  greater than or equal to 7% indicates suboptimal  control. A1c targets should be individualized based on  duration of diabetes, age, comorbid conditions, and  other considerations. . Currently, no consensus exists regarding use of hemoglobin A1c for diagnosis of diabetes for children. .          Passed - Cr in normal range and within 360 days    Creat  Date Value Ref Range Status  03/04/2024 1.00 0.50 - 1.05 mg/dL Final   Creatinine, Urine  Date Value Ref Range Status  11/05/2023 164 20 - 275 mg/dL Final         Passed - Valid encounter within last 6 months    Recent Outpatient Visits           3 months ago Type 2 diabetes mellitus with hyperglycemia, with long-term current use of insulin  [facility])   [facility] [place], Angeline ORN, NP   5 months ago Frequent headaches   [facility] [place], Angeline ORN, NP   9 months ago Encounter for general adult medical examination with abnormal findings   [facility] [place], Angeline ORN, NP

## 2024-08-10 ENCOUNTER — Other Ambulatory Visit: Payer: Self-pay

## 2024-09-12 ENCOUNTER — Ambulatory Visit: Admitting: Infectious Diseases

## 2024-11-08 ENCOUNTER — Encounter: Admitting: Internal Medicine
